# Patient Record
Sex: Female | Born: 1965
Health system: Southern US, Community
[De-identification: ages and names within clinical notes are randomized; demographics above are authoritative.]

## PROBLEM LIST (undated history)

## (undated) DIAGNOSIS — G8929 Other chronic pain: Secondary | ICD-10-CM

## (undated) DIAGNOSIS — H269 Unspecified cataract: Secondary | ICD-10-CM

## (undated) DIAGNOSIS — R51 Headache: Secondary | ICD-10-CM

## (undated) DIAGNOSIS — T7840XA Allergy, unspecified, initial encounter: Secondary | ICD-10-CM

## (undated) DIAGNOSIS — F909 Attention-deficit hyperactivity disorder, unspecified type: Secondary | ICD-10-CM

## (undated) DIAGNOSIS — R112 Nausea with vomiting, unspecified: Secondary | ICD-10-CM

## (undated) DIAGNOSIS — H2010 Chronic iridocyclitis, unspecified eye: Secondary | ICD-10-CM

## (undated) DIAGNOSIS — Z9889 Other specified postprocedural states: Secondary | ICD-10-CM

## (undated) DIAGNOSIS — K589 Irritable bowel syndrome without diarrhea: Secondary | ICD-10-CM

## (undated) DIAGNOSIS — M797 Fibromyalgia: Secondary | ICD-10-CM

## (undated) DIAGNOSIS — K760 Fatty (change of) liver, not elsewhere classified: Secondary | ICD-10-CM

## (undated) DIAGNOSIS — M1990 Unspecified osteoarthritis, unspecified site: Secondary | ICD-10-CM

## (undated) DIAGNOSIS — IMO0002 Reserved for concepts with insufficient information to code with codable children: Secondary | ICD-10-CM

## (undated) DIAGNOSIS — H35341 Macular cyst, hole, or pseudohole, right eye: Secondary | ICD-10-CM

## (undated) DIAGNOSIS — G4730 Sleep apnea, unspecified: Secondary | ICD-10-CM

## (undated) DIAGNOSIS — F32A Depression, unspecified: Secondary | ICD-10-CM

## (undated) DIAGNOSIS — E119 Type 2 diabetes mellitus without complications: Secondary | ICD-10-CM

## (undated) DIAGNOSIS — I10 Essential (primary) hypertension: Secondary | ICD-10-CM

## (undated) DIAGNOSIS — I82401 Acute embolism and thrombosis of unspecified deep veins of right lower extremity: Secondary | ICD-10-CM

## (undated) DIAGNOSIS — R519 Headache, unspecified: Secondary | ICD-10-CM

## (undated) DIAGNOSIS — S82409A Unspecified fracture of shaft of unspecified fibula, initial encounter for closed fracture: Secondary | ICD-10-CM

## (undated) DIAGNOSIS — E0590 Thyrotoxicosis, unspecified without thyrotoxic crisis or storm: Secondary | ICD-10-CM

## (undated) DIAGNOSIS — F419 Anxiety disorder, unspecified: Secondary | ICD-10-CM

## (undated) DIAGNOSIS — L309 Dermatitis, unspecified: Secondary | ICD-10-CM

## (undated) DIAGNOSIS — K219 Gastro-esophageal reflux disease without esophagitis: Secondary | ICD-10-CM

## (undated) DIAGNOSIS — E039 Hypothyroidism, unspecified: Secondary | ICD-10-CM

## (undated) DIAGNOSIS — F329 Major depressive disorder, single episode, unspecified: Secondary | ICD-10-CM

## (undated) HISTORY — DX: Depression, unspecified: F32.A

## (undated) HISTORY — PX: TONSILLECTOMY: SUR1361

## (undated) HISTORY — DX: Irritable bowel syndrome, unspecified: K58.9

## (undated) HISTORY — DX: Sleep apnea, unspecified: G47.30

## (undated) HISTORY — DX: Headache: R51

## (undated) HISTORY — PX: COLONOSCOPY: SHX174

## (undated) HISTORY — PX: WISDOM TOOTH EXTRACTION: SHX21

## (undated) HISTORY — DX: Dermatitis, unspecified: L30.9

## (undated) HISTORY — DX: Gastro-esophageal reflux disease without esophagitis: K21.9

## (undated) HISTORY — PX: FRACTURE SURGERY: SHX138

## (undated) HISTORY — PX: ABDOMINAL HYSTERECTOMY: SHX81

## (undated) HISTORY — DX: Allergy, unspecified, initial encounter: T78.40XA

## (undated) HISTORY — PX: BUNIONECTOMY: SHX129

## (undated) HISTORY — DX: Chronic iridocyclitis, unspecified eye: H20.10

## (undated) HISTORY — DX: Macular cyst, hole, or pseudohole, right eye: H35.341

## (undated) HISTORY — DX: Unspecified osteoarthritis, unspecified site: M19.90

## (undated) HISTORY — DX: Fibromyalgia: M79.7

## (undated) HISTORY — PX: BREAST SURGERY: SHX581

## (undated) HISTORY — DX: Reserved for concepts with insufficient information to code with codable children: IMO0002

## (undated) HISTORY — DX: Unspecified cataract: H26.9

## (undated) HISTORY — DX: Headache, unspecified: R51.9

## (undated) HISTORY — DX: Unspecified fracture of shaft of unspecified fibula, initial encounter for closed fracture: S82.409A

## (undated) HISTORY — DX: Essential (primary) hypertension: I10

## (undated) HISTORY — PX: EYE SURGERY: SHX253

## (undated) HISTORY — DX: Other chronic pain: G89.29

## (undated) HISTORY — DX: Major depressive disorder, single episode, unspecified: F32.9

---

## 1993-01-05 HISTORY — PX: BREAST EXCISIONAL BIOPSY: SUR124

## 1994-01-05 HISTORY — PX: ANTERIOR CRUCIATE LIGAMENT REPAIR: SHX115

## 1998-03-19 ENCOUNTER — Encounter: Payer: Self-pay | Admitting: Obstetrics and Gynecology

## 1998-03-19 ENCOUNTER — Ambulatory Visit (HOSPITAL_COMMUNITY): Admission: RE | Admit: 1998-03-19 | Discharge: 1998-03-19 | Payer: Self-pay | Admitting: Obstetrics and Gynecology

## 1998-04-16 ENCOUNTER — Other Ambulatory Visit: Admission: RE | Admit: 1998-04-16 | Discharge: 1998-04-16 | Payer: Self-pay | Admitting: Obstetrics and Gynecology

## 1998-12-06 ENCOUNTER — Ambulatory Visit (HOSPITAL_COMMUNITY): Admission: RE | Admit: 1998-12-06 | Discharge: 1998-12-06 | Payer: Self-pay | Admitting: Obstetrics and Gynecology

## 1999-04-06 ENCOUNTER — Encounter: Payer: Self-pay | Admitting: Emergency Medicine

## 1999-04-06 ENCOUNTER — Emergency Department (HOSPITAL_COMMUNITY): Admission: EM | Admit: 1999-04-06 | Discharge: 1999-04-06 | Payer: Self-pay | Admitting: Emergency Medicine

## 1999-05-29 ENCOUNTER — Other Ambulatory Visit: Admission: RE | Admit: 1999-05-29 | Discharge: 1999-05-29 | Payer: Self-pay | Admitting: Obstetrics and Gynecology

## 1999-10-20 ENCOUNTER — Encounter: Admission: RE | Admit: 1999-10-20 | Discharge: 1999-10-20 | Payer: Self-pay | Admitting: Emergency Medicine

## 1999-10-20 ENCOUNTER — Encounter: Payer: Self-pay | Admitting: Emergency Medicine

## 2000-04-04 ENCOUNTER — Emergency Department (HOSPITAL_COMMUNITY): Admission: EM | Admit: 2000-04-04 | Discharge: 2000-04-04 | Payer: Self-pay | Admitting: Emergency Medicine

## 2000-05-27 ENCOUNTER — Other Ambulatory Visit: Admission: RE | Admit: 2000-05-27 | Discharge: 2000-05-27 | Payer: Self-pay | Admitting: Obstetrics and Gynecology

## 2000-07-22 ENCOUNTER — Ambulatory Visit (HOSPITAL_COMMUNITY): Admission: RE | Admit: 2000-07-22 | Discharge: 2000-07-22 | Payer: Self-pay | Admitting: Obstetrics and Gynecology

## 2000-07-22 ENCOUNTER — Encounter (INDEPENDENT_AMBULATORY_CARE_PROVIDER_SITE_OTHER): Payer: Self-pay

## 2001-02-22 ENCOUNTER — Inpatient Hospital Stay (HOSPITAL_COMMUNITY): Admission: AD | Admit: 2001-02-22 | Discharge: 2001-02-22 | Payer: Self-pay | Admitting: Obstetrics and Gynecology

## 2001-02-22 ENCOUNTER — Encounter: Payer: Self-pay | Admitting: Obstetrics and Gynecology

## 2001-03-01 ENCOUNTER — Encounter: Payer: Self-pay | Admitting: Obstetrics and Gynecology

## 2001-03-01 ENCOUNTER — Inpatient Hospital Stay (HOSPITAL_COMMUNITY): Admission: AD | Admit: 2001-03-01 | Discharge: 2001-03-01 | Payer: Self-pay | Admitting: Obstetrics and Gynecology

## 2001-04-11 ENCOUNTER — Inpatient Hospital Stay (HOSPITAL_COMMUNITY): Admission: AD | Admit: 2001-04-11 | Discharge: 2001-04-11 | Payer: Self-pay | Admitting: Obstetrics and Gynecology

## 2001-04-20 ENCOUNTER — Inpatient Hospital Stay (HOSPITAL_COMMUNITY): Admission: AD | Admit: 2001-04-20 | Discharge: 2001-04-20 | Payer: Self-pay | Admitting: Obstetrics and Gynecology

## 2001-04-21 ENCOUNTER — Encounter: Payer: Self-pay | Admitting: Obstetrics and Gynecology

## 2001-04-22 ENCOUNTER — Inpatient Hospital Stay (HOSPITAL_COMMUNITY): Admission: AD | Admit: 2001-04-22 | Discharge: 2001-04-24 | Payer: Self-pay | Admitting: Obstetrics and Gynecology

## 2001-05-06 ENCOUNTER — Inpatient Hospital Stay (HOSPITAL_COMMUNITY): Admission: AD | Admit: 2001-05-06 | Discharge: 2001-05-06 | Payer: Self-pay | Admitting: Obstetrics & Gynecology

## 2001-05-07 ENCOUNTER — Inpatient Hospital Stay (HOSPITAL_COMMUNITY): Admission: AD | Admit: 2001-05-07 | Discharge: 2001-05-07 | Payer: Self-pay | Admitting: Obstetrics & Gynecology

## 2001-05-09 ENCOUNTER — Encounter (INDEPENDENT_AMBULATORY_CARE_PROVIDER_SITE_OTHER): Payer: Self-pay | Admitting: Specialist

## 2001-05-09 ENCOUNTER — Inpatient Hospital Stay (HOSPITAL_COMMUNITY): Admission: AD | Admit: 2001-05-09 | Discharge: 2001-05-13 | Payer: Self-pay | Admitting: *Deleted

## 2001-05-14 ENCOUNTER — Encounter: Admission: RE | Admit: 2001-05-14 | Discharge: 2001-06-13 | Payer: Self-pay | Admitting: Obstetrics and Gynecology

## 2001-06-07 ENCOUNTER — Encounter: Payer: Self-pay | Admitting: Obstetrics and Gynecology

## 2001-06-07 ENCOUNTER — Ambulatory Visit (HOSPITAL_COMMUNITY): Admission: RE | Admit: 2001-06-07 | Discharge: 2001-06-07 | Payer: Self-pay | Admitting: Obstetrics and Gynecology

## 2001-06-15 ENCOUNTER — Other Ambulatory Visit: Admission: RE | Admit: 2001-06-15 | Discharge: 2001-06-15 | Payer: Self-pay | Admitting: Obstetrics and Gynecology

## 2001-10-18 ENCOUNTER — Encounter: Admission: RE | Admit: 2001-10-18 | Discharge: 2001-10-18 | Payer: Self-pay | Admitting: Emergency Medicine

## 2001-10-18 ENCOUNTER — Encounter: Payer: Self-pay | Admitting: Emergency Medicine

## 2001-10-26 ENCOUNTER — Emergency Department (HOSPITAL_COMMUNITY): Admission: EM | Admit: 2001-10-26 | Discharge: 2001-10-26 | Payer: Self-pay | Admitting: Emergency Medicine

## 2002-06-28 ENCOUNTER — Other Ambulatory Visit: Admission: RE | Admit: 2002-06-28 | Discharge: 2002-06-28 | Payer: Self-pay | Admitting: Obstetrics and Gynecology

## 2002-09-13 ENCOUNTER — Encounter: Payer: Self-pay | Admitting: Emergency Medicine

## 2002-09-13 ENCOUNTER — Encounter: Admission: RE | Admit: 2002-09-13 | Discharge: 2002-09-13 | Payer: Self-pay | Admitting: Emergency Medicine

## 2002-10-08 ENCOUNTER — Emergency Department (HOSPITAL_COMMUNITY): Admission: EM | Admit: 2002-10-08 | Discharge: 2002-10-08 | Payer: Self-pay | Admitting: Emergency Medicine

## 2002-10-22 ENCOUNTER — Encounter: Payer: Self-pay | Admitting: Emergency Medicine

## 2002-10-22 ENCOUNTER — Encounter: Admission: RE | Admit: 2002-10-22 | Discharge: 2002-10-22 | Payer: Self-pay | Admitting: Emergency Medicine

## 2002-11-24 ENCOUNTER — Encounter: Admission: RE | Admit: 2002-11-24 | Discharge: 2002-11-24 | Payer: Self-pay | Admitting: Emergency Medicine

## 2002-12-12 ENCOUNTER — Encounter: Admission: RE | Admit: 2002-12-12 | Discharge: 2002-12-12 | Payer: Self-pay | Admitting: Emergency Medicine

## 2002-12-15 ENCOUNTER — Encounter: Admission: RE | Admit: 2002-12-15 | Discharge: 2002-12-15 | Payer: Self-pay | Admitting: Emergency Medicine

## 2002-12-28 ENCOUNTER — Encounter: Admission: RE | Admit: 2002-12-28 | Discharge: 2002-12-28 | Payer: Self-pay | Admitting: Emergency Medicine

## 2003-01-17 ENCOUNTER — Emergency Department (HOSPITAL_COMMUNITY): Admission: EM | Admit: 2003-01-17 | Discharge: 2003-01-17 | Payer: Self-pay | Admitting: Emergency Medicine

## 2003-04-03 ENCOUNTER — Emergency Department (HOSPITAL_COMMUNITY): Admission: EM | Admit: 2003-04-03 | Discharge: 2003-04-03 | Payer: Self-pay

## 2003-05-26 ENCOUNTER — Emergency Department (HOSPITAL_COMMUNITY): Admission: EM | Admit: 2003-05-26 | Discharge: 2003-05-26 | Payer: Self-pay | Admitting: Emergency Medicine

## 2003-07-27 ENCOUNTER — Encounter: Admission: RE | Admit: 2003-07-27 | Discharge: 2003-07-27 | Payer: Self-pay | Admitting: Emergency Medicine

## 2003-07-30 ENCOUNTER — Other Ambulatory Visit: Admission: RE | Admit: 2003-07-30 | Discharge: 2003-07-30 | Payer: Self-pay | Admitting: Obstetrics and Gynecology

## 2004-03-11 ENCOUNTER — Encounter: Admission: RE | Admit: 2004-03-11 | Discharge: 2004-03-11 | Payer: Self-pay | Admitting: Rheumatology

## 2004-05-05 ENCOUNTER — Encounter: Payer: Self-pay | Admitting: Family Medicine

## 2004-05-05 LAB — CONVERTED CEMR LAB

## 2004-06-04 ENCOUNTER — Other Ambulatory Visit: Admission: RE | Admit: 2004-06-04 | Discharge: 2004-06-04 | Payer: Self-pay | Admitting: Obstetrics and Gynecology

## 2004-06-10 ENCOUNTER — Ambulatory Visit: Payer: Self-pay | Admitting: Family Medicine

## 2004-06-30 ENCOUNTER — Ambulatory Visit: Payer: Self-pay | Admitting: Family Medicine

## 2004-07-24 ENCOUNTER — Ambulatory Visit (HOSPITAL_COMMUNITY): Admission: RE | Admit: 2004-07-24 | Discharge: 2004-07-24 | Payer: Self-pay | Admitting: Obstetrics and Gynecology

## 2004-07-24 ENCOUNTER — Encounter (INDEPENDENT_AMBULATORY_CARE_PROVIDER_SITE_OTHER): Payer: Self-pay | Admitting: *Deleted

## 2004-08-04 ENCOUNTER — Ambulatory Visit: Payer: Self-pay | Admitting: Family Medicine

## 2004-08-05 HISTORY — PX: LAPAROSCOPY: SHX197

## 2004-08-25 ENCOUNTER — Ambulatory Visit: Payer: Self-pay | Admitting: Family Medicine

## 2004-10-21 ENCOUNTER — Ambulatory Visit: Payer: Self-pay | Admitting: Family Medicine

## 2004-10-29 ENCOUNTER — Ambulatory Visit: Payer: Self-pay | Admitting: Family Medicine

## 2004-12-03 ENCOUNTER — Ambulatory Visit: Payer: Self-pay | Admitting: Family Medicine

## 2005-01-01 ENCOUNTER — Ambulatory Visit: Payer: Self-pay | Admitting: Family Medicine

## 2005-01-28 ENCOUNTER — Ambulatory Visit: Payer: Self-pay | Admitting: Family Medicine

## 2005-06-08 ENCOUNTER — Ambulatory Visit: Payer: Self-pay | Admitting: Gastroenterology

## 2005-06-19 ENCOUNTER — Ambulatory Visit: Payer: Self-pay | Admitting: Family Medicine

## 2005-06-25 ENCOUNTER — Ambulatory Visit: Payer: Self-pay | Admitting: Gastroenterology

## 2005-06-25 ENCOUNTER — Ambulatory Visit: Payer: Self-pay | Admitting: Family Medicine

## 2005-07-11 ENCOUNTER — Ambulatory Visit (HOSPITAL_COMMUNITY): Admission: RE | Admit: 2005-07-11 | Discharge: 2005-07-11 | Payer: Self-pay | Admitting: Family Medicine

## 2005-07-11 ENCOUNTER — Ambulatory Visit: Payer: Self-pay | Admitting: Family Medicine

## 2005-07-13 ENCOUNTER — Encounter: Admission: RE | Admit: 2005-07-13 | Discharge: 2005-07-13 | Payer: Self-pay | Admitting: Orthopedic Surgery

## 2005-07-21 ENCOUNTER — Ambulatory Visit: Payer: Self-pay | Admitting: Family Medicine

## 2005-08-17 ENCOUNTER — Ambulatory Visit: Payer: Self-pay | Admitting: Family Medicine

## 2005-09-19 ENCOUNTER — Ambulatory Visit: Payer: Self-pay | Admitting: Family Medicine

## 2005-10-30 ENCOUNTER — Ambulatory Visit: Payer: Self-pay | Admitting: Family Medicine

## 2006-01-14 ENCOUNTER — Ambulatory Visit: Payer: Self-pay | Admitting: Family Medicine

## 2006-02-02 ENCOUNTER — Ambulatory Visit (HOSPITAL_COMMUNITY): Admission: RE | Admit: 2006-02-02 | Discharge: 2006-02-02 | Payer: Self-pay | Admitting: Family Medicine

## 2006-02-02 ENCOUNTER — Ambulatory Visit: Payer: Self-pay | Admitting: Family Medicine

## 2006-02-03 ENCOUNTER — Ambulatory Visit (HOSPITAL_COMMUNITY): Admission: RE | Admit: 2006-02-03 | Discharge: 2006-02-03 | Payer: Self-pay | Admitting: Family Medicine

## 2006-02-25 ENCOUNTER — Ambulatory Visit: Payer: Self-pay | Admitting: Family Medicine

## 2006-04-27 ENCOUNTER — Ambulatory Visit: Payer: Self-pay | Admitting: Family Medicine

## 2006-05-21 ENCOUNTER — Encounter: Payer: Self-pay | Admitting: Family Medicine

## 2006-08-05 ENCOUNTER — Encounter (INDEPENDENT_AMBULATORY_CARE_PROVIDER_SITE_OTHER): Payer: Self-pay | Admitting: Obstetrics and Gynecology

## 2006-08-05 ENCOUNTER — Ambulatory Visit (HOSPITAL_COMMUNITY): Admission: RE | Admit: 2006-08-05 | Discharge: 2006-08-06 | Payer: Self-pay | Admitting: Obstetrics and Gynecology

## 2006-09-09 ENCOUNTER — Telehealth (INDEPENDENT_AMBULATORY_CARE_PROVIDER_SITE_OTHER): Payer: Self-pay | Admitting: *Deleted

## 2006-09-29 ENCOUNTER — Encounter: Payer: Self-pay | Admitting: Family Medicine

## 2006-09-29 DIAGNOSIS — F418 Other specified anxiety disorders: Secondary | ICD-10-CM | POA: Insufficient documentation

## 2006-09-29 DIAGNOSIS — N979 Female infertility, unspecified: Secondary | ICD-10-CM | POA: Insufficient documentation

## 2006-09-29 DIAGNOSIS — K589 Irritable bowel syndrome without diarrhea: Secondary | ICD-10-CM | POA: Insufficient documentation

## 2006-09-29 DIAGNOSIS — J302 Other seasonal allergic rhinitis: Secondary | ICD-10-CM | POA: Insufficient documentation

## 2006-09-29 DIAGNOSIS — G4700 Insomnia, unspecified: Secondary | ICD-10-CM | POA: Insufficient documentation

## 2006-09-29 DIAGNOSIS — M797 Fibromyalgia: Secondary | ICD-10-CM | POA: Insufficient documentation

## 2006-09-29 DIAGNOSIS — J3089 Other allergic rhinitis: Secondary | ICD-10-CM

## 2006-09-29 DIAGNOSIS — I10 Essential (primary) hypertension: Secondary | ICD-10-CM | POA: Insufficient documentation

## 2006-09-29 DIAGNOSIS — M549 Dorsalgia, unspecified: Secondary | ICD-10-CM | POA: Insufficient documentation

## 2006-09-29 DIAGNOSIS — K219 Gastro-esophageal reflux disease without esophagitis: Secondary | ICD-10-CM | POA: Insufficient documentation

## 2006-09-29 DIAGNOSIS — J4520 Mild intermittent asthma, uncomplicated: Secondary | ICD-10-CM | POA: Insufficient documentation

## 2006-09-29 DIAGNOSIS — F329 Major depressive disorder, single episode, unspecified: Secondary | ICD-10-CM | POA: Insufficient documentation

## 2006-09-29 DIAGNOSIS — N809 Endometriosis, unspecified: Secondary | ICD-10-CM | POA: Insufficient documentation

## 2006-10-01 ENCOUNTER — Ambulatory Visit: Payer: Self-pay | Admitting: Internal Medicine

## 2006-10-08 ENCOUNTER — Encounter: Payer: Self-pay | Admitting: Internal Medicine

## 2006-10-08 ENCOUNTER — Ambulatory Visit (HOSPITAL_BASED_OUTPATIENT_CLINIC_OR_DEPARTMENT_OTHER): Admission: RE | Admit: 2006-10-08 | Discharge: 2006-10-08 | Payer: Self-pay | Admitting: Internal Medicine

## 2006-10-13 ENCOUNTER — Ambulatory Visit: Payer: Self-pay | Admitting: Family Medicine

## 2006-10-15 ENCOUNTER — Ambulatory Visit: Payer: Self-pay | Admitting: Internal Medicine

## 2006-10-18 ENCOUNTER — Telehealth (INDEPENDENT_AMBULATORY_CARE_PROVIDER_SITE_OTHER): Payer: Self-pay | Admitting: *Deleted

## 2006-10-20 ENCOUNTER — Encounter: Payer: Self-pay | Admitting: Family Medicine

## 2006-10-28 ENCOUNTER — Ambulatory Visit: Payer: Self-pay | Admitting: Family Medicine

## 2006-11-04 ENCOUNTER — Ambulatory Visit: Payer: Self-pay | Admitting: Family Medicine

## 2006-11-11 ENCOUNTER — Ambulatory Visit: Payer: Self-pay | Admitting: Internal Medicine

## 2006-11-19 DIAGNOSIS — G4733 Obstructive sleep apnea (adult) (pediatric): Secondary | ICD-10-CM | POA: Insufficient documentation

## 2006-12-01 ENCOUNTER — Telehealth: Payer: Self-pay | Admitting: Family Medicine

## 2006-12-17 ENCOUNTER — Ambulatory Visit: Payer: Self-pay | Admitting: Internal Medicine

## 2007-01-06 DIAGNOSIS — S82409A Unspecified fracture of shaft of unspecified fibula, initial encounter for closed fracture: Secondary | ICD-10-CM

## 2007-01-06 HISTORY — DX: Unspecified fracture of shaft of unspecified fibula, initial encounter for closed fracture: S82.409A

## 2007-01-12 ENCOUNTER — Ambulatory Visit: Payer: Self-pay | Admitting: Family Medicine

## 2007-02-09 ENCOUNTER — Ambulatory Visit: Payer: Self-pay | Admitting: Family Medicine

## 2007-02-10 ENCOUNTER — Encounter: Payer: Self-pay | Admitting: Family Medicine

## 2007-03-16 ENCOUNTER — Ambulatory Visit: Payer: Self-pay | Admitting: Family Medicine

## 2007-03-21 LAB — CONVERTED CEMR LAB
ALT: 10 units/L (ref 0–35)
AST: 21 units/L (ref 0–37)
Albumin: 3.6 g/dL (ref 3.5–5.2)
Alkaline Phosphatase: 85 units/L (ref 39–117)
BUN: 6 mg/dL (ref 6–23)
Basophils Absolute: 0.1 10*3/uL (ref 0.0–0.1)
Basophils Relative: 0.9 % (ref 0.0–1.0)
Bilirubin, Direct: 0.1 mg/dL (ref 0.0–0.3)
CO2: 30 meq/L (ref 19–32)
Calcium: 9.2 mg/dL (ref 8.4–10.5)
Chloride: 106 meq/L (ref 96–112)
Cholesterol: 193 mg/dL (ref 0–200)
Creatinine, Ser: 0.9 mg/dL (ref 0.4–1.2)
Eosinophils Absolute: 0.4 10*3/uL (ref 0.0–0.6)
Eosinophils Relative: 4.1 % (ref 0.0–5.0)
GFR calc Af Amer: 90 mL/min
GFR calc non Af Amer: 74 mL/min
Glucose, Bld: 86 mg/dL (ref 70–99)
HCT: 41.8 % (ref 36.0–46.0)
HDL: 58.2 mg/dL (ref >39.0)
Hemoglobin: 13.9 g/dL (ref 12.0–15.0)
LDL Cholesterol: 110 mg/dL — ABNORMAL HIGH (ref 0–99)
Lymphocytes Relative: 26.6 % (ref 12.0–46.0)
MCHC: 33.3 g/dL (ref 30.0–36.0)
MCV: 86.5 fL (ref 78.0–100.0)
Monocytes Absolute: 0.6 10*3/uL (ref 0.2–0.7)
Monocytes Relative: 7.2 % (ref 3.0–11.0)
Neutro Abs: 5.2 10*3/uL (ref 1.4–7.7)
Neutrophils Relative %: 61.2 % (ref 43.0–77.0)
Phosphorus: 2.6 mg/dL (ref 2.3–4.6)
Platelets: 262 10*3/uL (ref 150–400)
Potassium: 3.9 meq/L (ref 3.5–5.1)
RBC: 4.84 M/uL (ref 3.87–5.11)
RDW: 14.8 % — ABNORMAL HIGH (ref 11.5–14.6)
Sodium: 141 meq/L (ref 135–145)
TSH: 2.41 microintl units/mL (ref 0.35–5.50)
Total Bilirubin: 0.6 mg/dL (ref 0.3–1.2)
Total CHOL/HDL Ratio: 3.3
Total Protein: 7.1 g/dL (ref 6.0–8.3)
Triglycerides: 122 mg/dL (ref 0–149)
VLDL: 24 mg/dL (ref 0–40)
WBC: 8.6 10*3/uL (ref 4.5–10.5)

## 2007-03-25 ENCOUNTER — Telehealth: Payer: Self-pay | Admitting: Family Medicine

## 2007-04-04 ENCOUNTER — Encounter: Payer: Self-pay | Admitting: Family Medicine

## 2007-04-06 ENCOUNTER — Encounter: Payer: Self-pay | Admitting: Family Medicine

## 2007-04-20 ENCOUNTER — Ambulatory Visit: Payer: Self-pay | Admitting: Family Medicine

## 2007-04-28 ENCOUNTER — Ambulatory Visit: Payer: Self-pay | Admitting: Internal Medicine

## 2007-06-09 ENCOUNTER — Encounter: Admission: RE | Admit: 2007-06-09 | Discharge: 2007-06-09 | Payer: Self-pay | Admitting: Family Medicine

## 2007-06-09 ENCOUNTER — Ambulatory Visit: Payer: Self-pay | Admitting: Family Medicine

## 2007-07-07 ENCOUNTER — Ambulatory Visit: Payer: Self-pay | Admitting: Family Medicine

## 2007-07-12 ENCOUNTER — Telehealth (INDEPENDENT_AMBULATORY_CARE_PROVIDER_SITE_OTHER): Payer: Self-pay | Admitting: Internal Medicine

## 2007-09-01 ENCOUNTER — Ambulatory Visit: Payer: Self-pay | Admitting: Family Medicine

## 2007-09-02 ENCOUNTER — Telehealth (INDEPENDENT_AMBULATORY_CARE_PROVIDER_SITE_OTHER): Payer: Self-pay | Admitting: *Deleted

## 2007-10-03 ENCOUNTER — Ambulatory Visit: Payer: Self-pay | Admitting: Family Medicine

## 2007-10-19 ENCOUNTER — Ambulatory Visit: Payer: Self-pay | Admitting: Family Medicine

## 2007-10-27 ENCOUNTER — Ambulatory Visit: Payer: Self-pay | Admitting: Family Medicine

## 2007-10-27 LAB — CONVERTED CEMR LAB: Rapid Strep: NEGATIVE

## 2007-11-10 ENCOUNTER — Encounter: Payer: Self-pay | Admitting: Family Medicine

## 2007-11-10 ENCOUNTER — Ambulatory Visit: Payer: Self-pay | Admitting: Family Medicine

## 2007-12-06 ENCOUNTER — Telehealth: Payer: Self-pay | Admitting: Family Medicine

## 2008-01-02 ENCOUNTER — Telehealth (INDEPENDENT_AMBULATORY_CARE_PROVIDER_SITE_OTHER): Payer: Self-pay | Admitting: *Deleted

## 2008-01-04 ENCOUNTER — Ambulatory Visit: Payer: Self-pay | Admitting: Family Medicine

## 2008-01-04 LAB — CONVERTED CEMR LAB: Rapid Strep: NEGATIVE

## 2008-01-26 ENCOUNTER — Ambulatory Visit: Payer: Self-pay | Admitting: Family Medicine

## 2008-02-06 ENCOUNTER — Telehealth (INDEPENDENT_AMBULATORY_CARE_PROVIDER_SITE_OTHER): Payer: Self-pay | Admitting: *Deleted

## 2008-02-13 ENCOUNTER — Telehealth: Payer: Self-pay | Admitting: Family Medicine

## 2008-02-22 ENCOUNTER — Encounter: Payer: Self-pay | Admitting: Family Medicine

## 2008-03-10 ENCOUNTER — Telehealth: Payer: Self-pay | Admitting: Internal Medicine

## 2008-03-10 ENCOUNTER — Ambulatory Visit: Payer: Self-pay | Admitting: Internal Medicine

## 2008-03-23 ENCOUNTER — Telehealth: Payer: Self-pay | Admitting: Family Medicine

## 2008-03-27 ENCOUNTER — Ambulatory Visit: Payer: Self-pay | Admitting: Family Medicine

## 2008-04-23 ENCOUNTER — Ambulatory Visit: Payer: Self-pay | Admitting: Internal Medicine

## 2008-07-02 ENCOUNTER — Telehealth: Payer: Self-pay | Admitting: Family Medicine

## 2008-09-04 ENCOUNTER — Telehealth: Payer: Self-pay | Admitting: Family Medicine

## 2008-10-03 ENCOUNTER — Ambulatory Visit: Payer: Self-pay | Admitting: Family Medicine

## 2009-02-14 ENCOUNTER — Telehealth: Payer: Self-pay | Admitting: Family Medicine

## 2009-04-29 ENCOUNTER — Ambulatory Visit: Payer: Self-pay | Admitting: Family Medicine

## 2009-05-22 ENCOUNTER — Ambulatory Visit: Payer: Self-pay | Admitting: Internal Medicine

## 2009-06-04 ENCOUNTER — Telehealth: Payer: Self-pay | Admitting: Internal Medicine

## 2009-06-07 ENCOUNTER — Telehealth: Payer: Self-pay | Admitting: Internal Medicine

## 2009-09-06 ENCOUNTER — Telehealth: Payer: Self-pay | Admitting: Internal Medicine

## 2009-10-09 ENCOUNTER — Ambulatory Visit: Payer: Self-pay | Admitting: Family Medicine

## 2009-11-12 ENCOUNTER — Ambulatory Visit: Payer: Self-pay | Admitting: Family Medicine

## 2009-11-12 DIAGNOSIS — J069 Acute upper respiratory infection, unspecified: Secondary | ICD-10-CM | POA: Insufficient documentation

## 2009-11-12 DIAGNOSIS — L301 Dyshidrosis [pompholyx]: Secondary | ICD-10-CM | POA: Insufficient documentation

## 2009-11-12 LAB — CONVERTED CEMR LAB: Rapid Strep: NEGATIVE

## 2009-11-18 ENCOUNTER — Ambulatory Visit: Payer: Self-pay | Admitting: Internal Medicine

## 2010-02-06 NOTE — Assessment & Plan Note (Signed)
Summary: 11:30  BACK PAIN/CLE   Vital Signs:  Patient Profile:   45 Years Old Female Weight:      183 pounds Temp:     98.5 degrees F oral Pulse rate:   68 / minute Pulse rhythm:   regular BP sitting:   110 / 80  (left arm) Cuff size:   regular  Vitals Entered By: Lowella Petties (June 09, 2007 11:23 AM)                 PCP:  Roxy Manns  Chief Complaint:  Left mid back pain.  History of Present Illness: middle/low back hurts on L side when it flares- it is severe for several days muscle relaxers do not work  hydrocodone does not help, tylenol does not help  worse if she twists certain direction-- and worse as the day goes on if she is up and around  has hx of disc disease in past scoliosis in LS - from last mri  no numbness or weakness no rad to leg or buttock no particular alleviating movement or position    Current Allergies: ERYTHROMYCIN  Past Medical History:    Reviewed history from 04/28/2007 and no changes required:       Allergic rhinitis       Asthma       Depression       GERD       Hypertension       fibromyalgia       deg disc disease       Sleep Apnea  Past Surgical History:    Reviewed history from 02/09/2007 and no changes required:       Laparoscopy- endometriosis- several       Left nipple removed- benign tumor       Left knee surgery (1996)- small tear ACL       Bunionectomy       Tonsillectomy       Laparoscopy- endometriosis (08/2004)       MRI LS- mild scoliosis (09/2002)       C-S MRI - spur, central disk herniation C6-7, T 1-3 (10/2002)       Arm fracture, radial head (07/2005)       hyst 7/08 for endometriosis and L ovary remains   Family History:    Reviewed history from 09/29/2006 and no changes required:       Father: DM       Mother: HTN, fibromyalgia, thyroid problems       Siblings: 1  Social History:    Reviewed history from 09/29/2006 and no changes required:       Marital Status: Married       Children: 1  daughter       Occupation: disabled- fibromyalgia     Review of Systems  General      Denies chills and fever.  GU      Denies dysuria, hematuria, and urinary frequency.      no urgency  MS      Complains of low back pain.      Denies joint redness and joint swelling.  Derm      Denies itching, lesion(s), and rash.  Neuro      Denies numbness, tingling, and weakness.   Physical Exam  General:     normal appearance and healthy appearing.   Head:     normocephalic, atraumatic, and no abnormalities observed.   Neck:     full rom ,  without bony tenderness  Chest Wall:     No deformities, masses, or tenderness noted. Lungs:     Normal respiratory effort, chest expands symmetrically. Lungs are clear to auscultation, no crackles or wheezes. Heart:     Normal rate and regular rhythm. S1 and S2 normal without gallop, murmur, click, rub or other extra sounds. Abdomen:     no suprapubic tenderness or fullness felt  Msk:     no CVA tenderness no bony LS tenderness  slt L SI tenderness no palpable mass or spasm neg slr poor rom LS- flex 10 deg, ext 10 deg slt lat flex twist is ok  Pulses:     R and L carotid,radial,femoral,dorsalis pedis and posterior tibial pulses are full and equal bilaterally Extremities:     No clubbing, cyanosis, edema, or deformity noted with normal full range of motion of all joints.   Neurologic:     strength normal in all extremities, sensation intact to light touch, gait normal, and DTRs symmetrical and normal.   Skin:     Intact without suspicious lesions or rashes Cervical Nodes:     No lymphadenopathy noted Inguinal Nodes:     No significant adenopathy Psych:     normal affect, talkative and pleasant     Impression & Recommendations:  Problem # 1:  Hx of BACK PAIN (ICD-724.5) Assessment: Deteriorated recurrent low L back pain- with hx of deg disc and scoliosis and fibromyalgia- and no neurol s/s will try ultram to see if better  pain control than vicodin  heat, gentle stretches check LS x ray and update The following medications were removed from the medication list:    Flexeril 10 Mg Tabs (Cyclobenzaprine hcl) .Marland Kitchen... 1 by mouth three times a day prn  Her updated medication list for this problem includes:    Naproxen Sodium 500 Mg Tb24 (Naproxen sodium) .Marland Kitchen... 1 by mouth two times a day as needed with food    Vicodin 5-500 Mg Tabs (Hydrocodone-acetaminophen) .Marland Kitchen... 1 by mouth at bedtime as needed pain    Ultram 50 Mg Tabs (Tramadol hcl) .Marland Kitchen... 1 by mouth three times a day as needed pain  Orders: Radiology Referral (Radiology)   Complete Medication List: 1)  Lexapro 20 Mg Tabs (Escitalopram oxalate) .Marland Kitchen.. 1 by mouth qd 2)  Advair Diskus 250-50 Mcg/dose Misc (Fluticasone-salmeterol) .... Inhale 1 puff as directed twice a day 3)  Hydrochlorothiazide 25 Mg Tabs (Hydrochlorothiazide) .... One by mouth qd 4)  Nifedipine 90 Mg Tb24 (Nifedipine) .... One by mouth qd 5)  Nasacort Aq 55 Mcg/act Aers (Triamcinolone acetonide(nasal)) .... Use 2 sprays in each nostril once daily as needed 6)  Proair Hfa 108 (90 Base) Mcg/act Aers (Albuterol sulfate) .... 2 puffs q 4 hours as needed wheeze 7)  Wellbutrin Xl 300 Mg Tb24 (Bupropion hcl) .Marland Kitchen.. 1 by mouth qd 8)  Restoril 15 Mg Caps (Temazepam) .Marland Kitchen.. 1 by mouth at bedtime prn 9)  Naproxen Sodium 500 Mg Tb24 (Naproxen sodium) .Marland Kitchen.. 1 by mouth two times a day as needed with food 10)  Vicodin 5-500 Mg Tabs (Hydrocodone-acetaminophen) .Marland Kitchen.. 1 by mouth at bedtime as needed pain 11)  Cpap 13  12)  Ultram 50 Mg Tabs (Tramadol hcl) .Marland Kitchen.. 1 by mouth three times a day as needed pain   Patient Instructions: 1)  for back pain- try the ultram (caution of sedation) 2)  you can also try your naproxen 3)  use gentle heat and stretching  4)  we will schedule LS  x ray at check out 5)  if pain worsens or any numbness or weakness update me    Prescriptions: ULTRAM 50 MG  TABS (TRAMADOL HCL) 1 by  mouth three times a day as needed pain  #30 x 0   Entered and Authorized by:   Judith Part MD   Signed by:   Judith Part MD on 06/09/2007   Method used:   Print then Give to Patient   RxID:   [id number]  ]

## 2010-02-06 NOTE — Assessment & Plan Note (Signed)
Summary: rov 6 months///kp   Primary Provider/Referring Provider:  Roxy Manns  CC:  6 month follow up visit-OSA-no complaints; Recent bout of URI-abx from PCP-amoxicillin-getting better.Marland Kitchen  History of Present Illness: 04/23/08-  Insomnia with OSA, Allergic rhinitis, asthma Continues cpap at 13. For past 4-5 months she is waking with gas bloating and cramps. Can't sleep without machine. She doesn't remember any changes of weight or recent mask style change to explain why this has come up in the last 3-4 months. Pollen rhinitis and asthma bothered her quite a bit earlier this spring but settling now. We discussed her meds.  May 22, 2009- Insomnia with OSA, Allergic rhinitis, asthma Spring has been rough, with a bad cold 2 months ago followed by pollen allergy. She is using Nyquil for her nose so she can sleep and use her CPAP, still set on 11. This pressure is better than before on 13. She is not snoring through,, and is able to use it all night. Despite two times a day use of Advair 250, and Singulair, she is using her rescue inhaler at least twice daily. Wheezing wakes her once nightly- uses her inhaler. Heart burn bothers her, wakes her, treated only with Tums.  November 18, 2009-  Insomnia with OSA, Allergic rhinitis, asthma Nurse-CC: 6 month follow up visit-OSA-no complaints; Recent bout of URI-abx from PCP-amoxicillin-getting better. She had to stop CPAP for awhile last week when she had a sinus infection treated by Dr Milinda Antis with antibiotic and now much improved.  Otherwise she has remained comfortable and compliant at 10 cwp.  Continues Adair and Singulair. Before she got sick she was using Proair 1-2x/ week. had flu shot.  Nose always stuffy, not helped much by Allegra D, Nasonex. Tried nasal stips in past. Considered ENT evaluation for reduction of turbinates- she decidecd to wait.   Asthma History    Initial Asthma Severity Rating:    Age range: 12+ years    Symptoms: 0-2  days/week    Nighttime Awakenings: 0-2/month    Interferes w/ normal activity: no limitations    SABA use (not for EIB): 0-2 days/week    Asthma Severity Assessment: Intermittent   Preventive Screening-Counseling & Management  Alcohol-Tobacco     Smoking Status: never  Allergies: 1)  ! * Nifedipine 2)  ! Zoloft 3)  ! Lexapro 4)  ! Claritin 5)  ! Zyrtec Allergy 6)  ! * Lisinopril 7)  Erythromycin  Past History:  Past Medical History: Last updated: 09/01/2007 Allergic rhinitis Asthma Depression GERD Hypertension fibromyalgia deg disc disease Sleep Apnea fibula fx 2009  Past Surgical History: Last updated: 09/01/2007 Laparoscopy- endometriosis- several Left nipple removed- benign tumor Left knee surgery (1996)- small tear ACL Bunionectomy Tonsillectomy Laparoscopy- endometriosis (08/2004) MRI LS- mild scoliosis (09/2002) C-S MRI - spur, central disk herniation C6-7, T 1-3 (10/2002) Arm fracture, radial head (07/2005) hyst 7/08 for endometriosis and L ovary remains fx fibula  2009  Family History: Last updated: 09/29/2006 Father: DM Mother: HTN, fibromyalgia, thyroid problems Siblings: 1  Social History: Last updated: 11/18/2009 Marital Status: Married Children: 1 daughter Occupation: disabled- fibromyalgia  Patient never smoked.   Risk Factors: Smoking Status: never (11/18/2009)  Social History: Marital Status: Married Children: 1 daughter Occupation: disabled- fibromyalgia  Patient never smoked.   Review of Systems      See HPI       The patient complains of nasal congestion/difficulty breathing through nose and sneezing.  The patient denies shortness of breath with activity, shortness  of breath at rest, productive cough, non-productive cough, coughing up blood, chest pain, irregular heartbeats, acid heartburn, indigestion, loss of appetite, weight change, abdominal pain, difficulty swallowing, sore throat, tooth/dental problems, headaches,  itching, ear ache, rash, change in color of mucus, and fever.    Vital Signs:  Patient profile:   45 year old female Height:      66.5 inches Weight:      184.38 pounds BMI:     29.42 O2 Sat:      98 % on Room air Pulse rate:   98 / minute BP sitting:   144 / 86  (left arm) Cuff size:   regular  Vitals Entered By: Reynaldo Minium CMA (November 18, 2009 10:31 AM)  O2 Flow:  Room air  Physical Exam  Additional Exam:  General: A/Ox3; pleasant and cooperative, NAD, Medium build,  calm and appropriate SKIN: no rash, lesions NODES: no lymphadenopathy HEENT: [place]/AT, EOM- WNL, Conjuctivae- clear, PERRLA, TM-WNL, Nose- clear, turbinate prominence., Throat- clear and wnl, Mallampati III NECK: Supple w/ fair ROM, JVD- none, normal carotid impulses w/o bruits Thyroid- normal to palpation CHEST: Clear to P&A HEART: RRR, no m/g/r heard ABDOMEN: Soft and nl; nml bowel sounds; ZOX:WRUE, nl pulses, no edema  NEURO: Grossly intact to observation     Impression & Recommendations:  Problem # 1:  OBSTRUCTIVE SLEEP APNEA (ICD-327.23)  Good compliance and control w/ CPAP. No change needed for now.   Problem # 2:  ALLERGIC RHINITIS (ICD-477.9)  Perennial rhinitis.  We had skin tested years ago- paper chart. I will have that pulled for review.  Her updated medication list for this problem includes:    Nasonex 50 Mcg/act Susp (Mometasone furoate) .Marland Kitchen... 2 puffs each nostril once daily  Problem # 3:  ASTHMA (ICD-493.90) Current tools are sufficient. Discussed and compared maintenance to rescue therapy.  Other Orders: Est. Patient Level III (45409)  Patient Instructions: 1)  Please schedule a follow-up appointment in 6 months. 2)  We will look for old allergy skin test in paper  chart.  3)  Sample Astepro nasal antihistamine spray 4)     1-2 puffs each nostril up to twice daily if needed.Marland Kitchen

## 2010-02-06 NOTE — Assessment & Plan Note (Signed)
Summary: FLU SHOT/TOWER/DLO   Nurse Visit   Allergies: 1)  ! * Nifedipine 2)  ! Zoloft 3)  ! Lexapro 4)  ! Claritin 5)  ! Zyrtec Allergy 6)  ! * Lisinopril 7)  Erythromycin  Orders Added: 1)  Admin 1st Vaccine [90471] 2)  Flu Vaccine 13yrs + [16109]  Flu Vaccine Consent Questions     Do you have a history of severe allergic reactions to this vaccine? no    Any prior history of allergic reactions to egg and/or gelatin? no    Do you have a sensitivity to the preservative Thimersol? no    Do you have a past history of Guillan-Barre Syndrome? no    Do you currently have an acute febrile illness? no    Have you ever had a severe reaction to latex? no    Vaccine information given and explained to patient? yes    Are you currently pregnant? no    Lot Number:[id number]   Exp Date:07/05/2010   Site Given  Left Deltoid IMlu

## 2010-02-06 NOTE — Progress Notes (Signed)
Summary: prescript  Phone Note Call from Patient   Caller: Patient Call For: Micheal Murad Summary of Call: need refill for advair 250/50 [facility] Initial call taken by: Rickard Patience,  Jun 04, 2009 8:34 AM  Follow-up for Phone Call        rx sent. pt aware.Carron Curie CMA  Jun 04, 2009 9:37 AM     Prescriptions: ADVAIR DISKUS 250-50 MCG/DOSE MISC (FLUTICASONE-SALMETEROL) Inhale 1 puff as directed twice a day  #3 month x 3   Entered by:   Carron Curie CMA   Authorized by:   Waymon Budge MD   Signed by:   Carron Curie CMA on 06/04/2009   Method used:   Electronically to        [facility]  [address] [phone]* (retail)       [address]       [place], [place]  [address]       Ph: [phone]       Fax: [phone]   RxID:   [id number]

## 2010-02-06 NOTE — Assessment & Plan Note (Signed)
Summary: 12 months/apc   Primary Provider/Referring Provider:  Roxy Manns  CC:  Yearly follow up visit-sleep; doing good with CPAP machine; also having increased allergies .  History of Present Illness: 04/28/07- She returns for follow-up of her insomnia with sleep apnea, asthma, and allergic rhinitis.  She is concerned she might have a sinus infection.  She has had increased sinus pressure, and some yellowish nasal discharge.  Her upper teeth are hurting, and ears are popping.  This just started this morning.  She took a Zyrtec antihistamine.  She has continued very compliant with CPAP at 13CWP.  She has slept well with this and reported no particular problems.  04/23/08-  Insomnia with OSA, Allergic rhinitis, asthma Continues cpap at 13. For past 4-5 months she is waking with gas bloating and cramps. Can't sleep without machine. She doesn't remember any changes of weight or recent mask style change to explain why this has come up in the last 3-4 months. Pollen rhinitis and asthma bothered her quite a bit earlier this spring but settling now. We discussed her meds.  May 22, 2009- Insomnia with OSA, Allergic rhinitis, asthma Spring has been rough, with a bad cold 2 months ago followed by pollen allergy. She is using Nyquil for her nose so she can sleep and use her CPAP, still set on 11. This pressure is better than before on 13. She is not snoring through,, and is able to use it all night. Despite two times a day use of Advair 250, and Singulair, she is using her rescue inhaler at least twice daily. Wheezing wakes her once nightly- uses her inhaler. Heart burn bothers her, wakes her, treated only with Tums.    Current Medications (verified): 1)  Advair Diskus 250-50 Mcg/dose Misc (Fluticasone-Salmeterol) .... Inhale 1 Puff As Directed Twice A Day 2)  Proair Hfa 108 (90 Base) Mcg/act  Aers (Albuterol Sulfate) .... 2 Puffs Q 4 Hours As Needed Wheeze 3)  Cpap 13 4)  Singulair 10 Mg Tabs  (Montelukast Sodium) .Marland Kitchen.. 1 By Mouth Once Daily For Asthma and Allergy Symptoms 5)  Benicar 40 Mg Tabs (Olmesartan Medoxomil) .Marland Kitchen.. 1 By Mouth Once Daily  Allergies (verified): 1)  ! * Nifedipine 2)  ! Zoloft 3)  ! Lexapro 4)  ! Claritin 5)  ! Zyrtec Allergy 6)  ! * Lisinopril 7)  Erythromycin  Past History:  Past Medical History: Last updated: 09/01/2007 Allergic rhinitis Asthma Depression GERD Hypertension fibromyalgia deg disc disease Sleep Apnea fibula fx 2009  Past Surgical History: Last updated: 09/01/2007 Laparoscopy- endometriosis- several Left nipple removed- benign tumor Left knee surgery (1996)- small tear ACL Bunionectomy Tonsillectomy Laparoscopy- endometriosis (08/2004) MRI LS- mild scoliosis (09/2002) C-S MRI - spur, central disk herniation C6-7, T 1-3 (10/2002) Arm fracture, radial head (07/2005) hyst 7/08 for endometriosis and L ovary remains fx fibula  2009  Family History: Last updated: 09/29/2006 Father: DM Mother: HTN, fibromyalgia, thyroid problems Siblings: 1  Social History: Last updated: 10/03/2007 Marital Status: Married Children: 1 daughter Occupation: disabled- fibromyalgia  non smoker  Risk Factors: Smoking Status: never (09/29/2006)  Review of Systems      See HPI       The patient complains of shortness of breath with activity, non-productive cough, acid heartburn, and nasal congestion/difficulty breathing through nose.  The patient denies shortness of breath at rest, productive cough, coughing up blood, chest pain, irregular heartbeats, indigestion, loss of appetite, weight change, abdominal pain, difficulty swallowing, sore throat, tooth/dental problems, headaches, sneezing,  itching, ear ache, anxiety, depression, hand/feet swelling, joint stiffness or pain, rash, change in color of mucus, and fever.    Vital Signs:  Patient profile:   45 year old female Height:      66.5 inches Weight:      179 pounds BMI:      28.56 O2 Sat:      96 % on Room air Pulse rate:   102 / minute BP sitting:   130 / 92  (left arm) Cuff size:   regular  Vitals Entered By: Reynaldo Minium CMA (May 22, 2009 9:26 AM)  O2 Flow:  Room air  Physical Exam  Additional Exam:  General: A/Ox3; pleasant and cooperative, NAD, Medium build,  calm and appropriate SKIN: no rash, lesions NODES: no lymphadenopathy HEENT: [place]/AT, EOM- WNL, Conjuctivae- clear, PERRLA, TM-WNL, Nose- clear, Throat- clear and wnl, Mallampati III NECK: Supple w/ fair ROM, JVD- none, normal carotid impulses w/o bruits Thyroid- normal to palpation CHEST: Clear to P&A HEART: RRR, no m/g/r heard ABDOMEN: Soft and nl; nml bowel sounds; EAV:WUJW, nl pulses, no edema  NEURO: Grossly intact to observation     Impression & Recommendations:  Problem # 1:  OBSTRUCTIVE SLEEP APNEA (ICD-327.23) Good compliance and control at 11 cwp. Her nasal congestion is interfering.  Problem # 2:  ASTHMA (ICD-493.90) Control is not optimal. This may be a short term problem as allergy season ends in a few weeks. We discussed use of antihistamines, nasal steroids and option to further evaluate if discomfort persists.  Problem # 3:  ALLERGIC RHINITIS (ICD-477.9)  She is off of flonase and not taking anything for this, except for Nyquil at night. The following medications were removed from the medication list:    Flonase 50 Mcg/act Susp (Fluticasone propionate) .Marland Kitchen... 2 sprays in each nostril once daily  Problem # 4:  GERD (ICD-530.81)  This is bad enough to wake her. We discussed reflux control and suggested an otc acid blocker.  Medications Added to Medication List This Visit: 1)  Cpap 11 [facility]   Other Orders: Est. Patient Level III (11914)  Patient Instructions: 1)  Please schedule a follow-up appointment in 6 months. 2)  if asthma doesn't settle down in next couple of weeks as the pollen levels fall, let me know. 3)  Suggest an otc acid blocker like  pepcid, omeprazole, nexium- generics are fine. Take it before a meal so it acitvates properly. 4)  continue CPAP at 11.

## 2010-02-06 NOTE — Progress Notes (Signed)
Summary: regarding bariatric clinic  Phone Note Call from Patient Call back at Home Phone [phone]   Caller: Patient Call For: [name] Part MD Summary of Call: Pt is interested in going to a bariatric clinic for weight loss.  She wants your opinion on whether this is safe.  She is not interested in the surgery, just the life style changes that they promote. Initial call taken by: Lowella Petties CMA,  February 14, 2009 11:15 AM  Follow-up for Phone Call        I am a fan of the lifestyle change - just not the weight loss drugs  I have no problem with it otherwise  I also recommend weight watchers program - either meetings or on line   Follow-up by: [name] Part MD,  February 14, 2009 11:50 AM  Additional Follow-up for Phone Call Additional follow up Details #1::        Left message for patient to call back. Lewanda Rife LPN  February 14, 2009 12:01 PM   Patient notified as instructed by telephone. Pt said that was all she needed. Lewanda Rife LPN  February 14, 2009 3:53 PM

## 2010-02-06 NOTE — Assessment & Plan Note (Signed)
Summary: SORE THROAT, CONGESTION   Vital Signs:  Patient profile:   45 year old female Height:      66.5 inches Weight:      182.50 pounds BMI:     29.12 Temp:     98.4 degrees F oral Pulse rate:   96 / minute Pulse rhythm:   regular BP sitting:   128 / 84  (left arm) Cuff size:   regular  Vitals Entered By: Lewanda Rife LPN (November 12, 2009 12:15 PM) CC: sorethroat (husband has strep throat), head congestion and bumps on fingers   History of Present Illness: was exp to strep sore throat and congestion and tired   hard to tell if it is a cold  no facial pain  green nasal discharge   is coughig also  not productive  hurts her throat to cough  voice is ok   no n/v/d   no fever or chills   also gets bumps on her fingers in the wintertime nothing in it -just a sore bump - cracks open and peels  has tried neosporin they get really sore   Allergies: 1)  ! * Nifedipine 2)  ! Zoloft 3)  ! Lexapro 4)  ! Claritin 5)  ! Zyrtec Allergy 6)  ! * Lisinopril 7)  Erythromycin  Past History:  Past Medical History: Last updated: 09/01/2007 Allergic rhinitis Asthma Depression GERD Hypertension fibromyalgia deg disc disease Sleep Apnea fibula fx 2009  Past Surgical History: Last updated: 09/01/2007 Laparoscopy- endometriosis- several Left nipple removed- benign tumor Left knee surgery (1996)- small tear ACL Bunionectomy Tonsillectomy Laparoscopy- endometriosis (08/2004) MRI LS- mild scoliosis (09/2002) C-S MRI - spur, central disk herniation C6-7, T 1-3 (10/2002) Arm fracture, radial head (07/2005) hyst 7/08 for endometriosis and L ovary remains fx fibula  2009  Family History: Last updated: 09/29/2006 Father: DM Mother: HTN, fibromyalgia, thyroid problems Siblings: 1  Social History: Last updated: 10/03/2007 Marital Status: Married Children: 1 daughter Occupation: disabled- fibromyalgia  non smoker  Risk Factors: Smoking Status: never  (09/29/2006)  Review of Systems General:  Complains of chills and fatigue; denies loss of appetite. Eyes:  Denies blurring, discharge, and eye irritation. ENT:  Complains of hoarseness, nasal congestion, postnasal drainage, and sore throat; denies earache. CV:  Denies chest pain or discomfort, palpitations, and shortness of breath with exertion. Resp:  Complains of cough and sputum productive; denies pleuritic, shortness of breath, and wheezing. GI:  Denies diarrhea, nausea, and vomiting. MS:  Complains of joint pain and muscle aches. Derm:  Complains of insect bite(s) and rash.  Physical Exam  General:  overweight but generally well appearing  Head:  normocephalic, atraumatic, and no abnormalities observed.   Eyes:  vision grossly intact, pupils equal, pupils round, and pupils reactive to light.  no conjunctival pallor, injection or icterus  Ears:  R ear normal and L ear normal.   Nose:  nares are injected and congested bilaterally  Mouth:  pharynx pink and moist, no erythema, and no exudates.  some clear post nasal drip  Neck:  supple with full rom and no masses or thyromegally, no JVD or carotid bruit  Lungs:  CTA  harsh bs at bases no rales or rhonchi  Heart:  Normal rate and regular rhythm. S1 and S2 normal without gallop, murmur, click, rub or other extra sounds. Skin:  some dry skin on hands with peeling in fingers few dry papules resembling eczema  Cervical Nodes:  No lymphadenopathy noted Psych:  normal  affect, talkative and pleasant    Impression & Recommendations:  Problem # 1:  URI (ICD-465.9) Assessment New  recommend sympt care- see pt instructions   guifen ac for nite cough pt advised to update me if symptoms worsen or do not improve  Her updated medication list for this problem includes:    Guaifenesin Ac 100-10 Mg/53ml Syrp (Guaifenesin-codeine) .Marland Kitchen... 1-2 teaspoons by mouth at bedtime as needed cough  warn- may sedate  Orders: Rapid Strep  (14782) Prescription Created Electronically [phone])  Problem # 2:  COMMUNICABLE DISEASE, EXPOSURE TO (ICD-V01.9) Assessment: New  recent exp to strep neg rapid strep today given px for amox to take on trip in case she develops symptoms/ worsens  Orders: Rapid Strep (30865) Prescription Created Electronically [phone])  Problem # 3:  DYSHIDROTIC ECZEMA, HANDS (ICD-705.81) Assessment: New  counseled on avoidance of hot water and harsh soaps  disc use of moisturizers  elicon cream once daily as needed - update if not improved   Orders: Prescription Created Electronically [phone])  Complete Medication List: 1)  Advair Diskus 250-50 Mcg/dose Misc (Fluticasone-salmeterol) .... Inhale 1 puff as directed twice a day 2)  Proair Hfa 108 (90 Base) Mcg/act Aers (Albuterol sulfate) .... 2 puffs q 4 hours as needed wheeze 3)  Cpap 11 [facility]  4)  Singulair 10 Mg Tabs (Montelukast sodium) .Marland Kitchen.. 1 by mouth once daily for asthma and allergy symptoms 5)  Benicar 40 Mg Tabs (Olmesartan medoxomil) .Marland Kitchen.. 1 by mouth once daily 6)  Nasonex 50 Mcg/act Susp (Mometasone furoate) .... 2 puffs each nostril once daily 7)  Elocon 0.1 % Crea (Mometasone furoate) .... Apply to affected areas on hands once daily as needed 8)  Amoxicillin 500 Mg Caps (Amoxicillin) .Marland Kitchen.. 1 by mouth three times a day for 10 days 9)  Guaifenesin Ac 100-10 Mg/70ml Syrp (Guaifenesin-codeine) .Marland Kitchen.. 1-2 teaspoons by mouth at bedtime as needed cough  warn- may sedate  Patient Instructions: 1)  try elicon cream for bumps on fingers 2)  I think it is a type of eczema  3)  also avoid hot water and harsh soaps  4)  use non scented moisturizer often - like lubriderm or eucerin  5)  hold px for amoxicillin-- if your sore throat worsens take it  6)  you can try mucinex over the counter twice daily as directed and nasal saline spray for congestion 7)  tylenol over the counter as directed may help with aches, headache and fever 8)  call  if symptoms worsen or if not improved in 4-5 days  Prescriptions: GUAIFENESIN AC 100-10 MG/5ML SYRP (GUAIFENESIN-CODEINE) 1-2 teaspoons by mouth at bedtime as needed cough  warn- may sedate  #120cc x 0   Entered and Authorized by:   Judith Part MD   Signed by:   Judith Part MD on 11/12/2009   Method used:   Print then Give to Patient   RxID:   [id number] AMOXICILLIN 500 MG CAPS (AMOXICILLIN) 1 by mouth three times a day for 10 days  #30 x 0   Entered and Authorized by:   Judith Part MD   Signed by:   Judith Part MD on 11/12/2009   Method used:   Print then Give to Patient   RxID:   [id number] ELOCON 0.1 % CREA (MOMETASONE FUROATE) apply to affected areas on hands once daily as needed  #1 small x 1   Entered and Authorized by:   Judith Part MD  Signed by:   Judith Part MD on 11/12/2009   Method used:   [facility]  [address] [id number]       [address]       [place], [place]  [address]       Ph: [phone] or [phone]       Fax: [phone]   RxID:   [id number]    Orders Added: 1)  Est. Patient Level IV [36644] 2)  Rapid Strep [03474] 3)  Prescription Created Electronically [id number]    Current Allergies (reviewed today): ! * NIFEDIPINE ! ZOLOFT ! LEXAPRO ! CLARITIN ! ZYRTEC ALLERGY ! * LISINOPRIL ERYTHROMYCIN  Laboratory Results  Date/Time Received: November 12, 2009 12:16 PM  Date/Time Reported: November 12, 2009 12:16 PM   Other Tests  Rapid Strep: negative

## 2010-02-06 NOTE — Progress Notes (Signed)
Summary: c/o nasal congestion  Phone Note Call from Patient   Caller: Patient Call For: YOUNG Summary of Call: WANT TO KNOW IF SHE CAN GET SOMETHING FOR STUFFY ITCY NASAL [facility] [address] [address] Initial call taken by: Rickard Patience,  September 06, 2009 9:49 AM  Follow-up for Phone Call        Spoke with pt.  She c/o nasal congestion x 2 months- not improving on zyrtec and clariton.  She states that she is not even able to blow her nose, breathe through her nose at all and she also c/o nose feeling itchy.  Would like rx.  Pls advise, thanks! Allergies (verified):  1)  ! * Nifedipine 2)  ! Zoloft 3)  ! Lexapro 4)  ! Claritin 5)  ! Zyrtec Allergy 6)  ! * Lisinopril 7)  Erythromycin Follow-up by: Vernie Murders,  September 06, 2009 10:01 AM  Additional Follow-up for Phone Call Additional follow up Details #1::        Suggest she try signing at pharmacy counter for allegra- D 12 hour. It may also help to do nasal saline rises- Neti pot. If these don't help through weekend then call back Tuesday.    Additional Follow-up for Phone Call Additional follow up Details #2::    Spoke with pt and advised her to try allegra-d 12 hr. and netti pot.  She states that she has already tried the allegra-d 12 hr and also has tried netti pot "on and off" without relief.  Would like to know if there is some prescription med we can call in. Pls advise, thanks Follow-up by: Vernie Murders,  September 06, 2009 10:26 AM  Additional Follow-up for Phone Call Additional follow up Details #3:: Details for Additional Follow-up Action Taken: per CY use Afrin once or twice daily x 3 days to open nose, at the same time start Nasonex 2 puffs each nostril once daily and continue this daily #1, no refill. Carron Curie CMA  September 06, 2009 2:10 PM  pt advised of recs and rx sent. Carron Curie CMA  September 06, 2009 2:13 PM   New/Updated Medications: NASONEX 50 MCG/ACT SUSP (MOMETASONE FUROATE) 2 puffs each  nostril once daily AFRIN NASAL SPRAY 0.05 % SOLN (OXYMETAZOLINE HCL) once or twice daily x 3 days only Prescriptions: NASONEX 50 MCG/ACT SUSP (MOMETASONE FUROATE) 2 puffs each nostril once daily  #1 x 0   Entered by:   Carron Curie CMA   Authorized by:   Waymon Budge MD   Signed by:   Carron Curie CMA on 09/06/2009   Method used:   [facility]        [facility]  [address] [phone][facility] ([facility]ail[facility]       [address]       [place], [place]  [address]       Ph: [phone]       Fax: [phone]   RxID:   [id number]

## 2010-02-06 NOTE — Progress Notes (Signed)
Summary: Advair PA-pt needs to contact insurance co  Phone Note Outgoing Call   Call placed by: Gweneth Dimitri RN,  June 07, 2009 5:10 PM Summary of Call: Received request to initiate [facility] Auth [facility]  Called [facility] at 1 [phone] to initiate Prior Auth.  Spoke with [employer].  Per Dorise Hiss, the pt the's insurance plan is inactive as of 06/04/09 and pt needs to contact primary insuance provider.  Once insurance is active again, the prior auth process can be continued.   Initial call taken by: Gweneth Dimitri RN,  June 07, 2009 5:12 PM  Follow-up for Phone Call        ATC pt to see if she has recently changed insurance companies, etc.  [facility] Gweneth Dimitri RN  June 07, 2009 5:13 PM  Pt states she ahs a new rx card and took it to pharmacy and they filled her meds so nothign further needed. Carron Curie CMA  June 10, 2009 3:45 PM

## 2010-02-06 NOTE — Assessment & Plan Note (Signed)
Summary: festering boil/alc   Vital Signs:  Patient profile:   45 year old female Height:      66.5 inches Weight:      185 pounds BMI:     29.52 Temp:     98.9 degrees F oral Pulse rate:   96 / minute Pulse rhythm:   regular BP sitting:   140 / 88  (left arm) Cuff size:   regular  Vitals Entered By: Benny Lennert CMA Duncan Dull) (April 29, 2009 11:17 AM)  History of Present Illness: Chief complaint boil  45 year old presents with cellulitis, abcess that has been present on her L inner leg for about 3 weeks intermittently. Has busted twice and whitish, yellowish fluid has been discharged. Better now, but continues to have some redness and tenderness. No fever.   Allergies: 1)  ! * Nifedipine 2)  ! Zoloft 3)  ! Lexapro 4)  ! Claritin 5)  ! Zyrtec Allergy 6)  ! * Lisinopril 7)  Erythromycin  Past History:  Past medical, surgical, family and social histories (including risk factors) reviewed, and no changes noted (except as noted below).  Past Medical History: Reviewed history from 09/01/2007 and no changes required. Allergic rhinitis Asthma Depression GERD Hypertension fibromyalgia deg disc disease Sleep Apnea fibula fx 2009  Past Surgical History: Reviewed history from 09/01/2007 and no changes required. Laparoscopy- endometriosis- several Left nipple removed- benign tumor Left knee surgery (1996)- small tear ACL Bunionectomy Tonsillectomy Laparoscopy- endometriosis (08/2004) MRI LS- mild scoliosis (09/2002) C-S MRI - spur, central disk herniation C6-7, T 1-3 (10/2002) Arm fracture, radial head (07/2005) hyst 7/08 for endometriosis and L ovary remains fx fibula  2009  Family History: Reviewed history from 09/29/2006 and no changes required. Father: DM Mother: HTN, fibromyalgia, thyroid problems Siblings: 1  Social History: Reviewed history from 10/03/2007 and no changes required. Marital Status: Married Children: 1 daughter Occupation: disabled-  fibromyalgia  non smoker  Review of Systems      See HPI General:  Denies chills, fatigue, and fever. Derm:  See HPI; Complains of lesion(s).  Physical Exam  General:  GEN: Well-developed,well-nourished,in no acute distress; alert,appropriate and cooperative throughout examination HEENT: Normocephalic and atraumatic without obvious abnormalities. No apparent alopecia or balding. Ears, externally no deformities PULM: Breathing comfortably in no respiratory distress EXT: No clubbing, cyanosis, or edema PSYCH: Normally interactive. Cooperative during the interview. Pleasant. Friendly and conversant. Not anxious or depressed appearing. Normal, full affect.  Skin:  L inner thigh with an area that is reddish in apperance with a centralized area that is more red and healing. No induration and no fluctuance   Impression & Recommendations:  Problem # 1:  CELLULITIS AND ABSCESS OF UNSPECIFIED SITE (ICD-682.9) Assessment New Cellulitis of leg, do not think drainable. Moist heat and ABX including MRSA coverage. If becomes worse, more indurated or fluctuant RTC  Her updated medication list for this problem includes:    Septra Ds 800-160 Mg Tabs (Sulfamethoxazole-trimethoprim) .Marland Kitchen... 2  by mouth twice daily    Cephalexin 500 Mg Tabs (Cephalexin) .Marland Kitchen... Take one by mouth 4 times daily  Problem # 2:  ABSCESS, LEG (ICD-682.6) Assessment: New  Her updated medication list for this problem includes:    Septra Ds 800-160 Mg Tabs (Sulfamethoxazole-trimethoprim) .Marland Kitchen... 2  by mouth twice daily    Cephalexin 500 Mg Tabs (Cephalexin) .Marland Kitchen... Take one by mouth 4 times daily  Complete Medication List: 1)  Advair Diskus 250-50 Mcg/dose Misc (Fluticasone-salmeterol) .... Inhale 1 puff as directed  twice a day 2)  Proair Hfa 108 (90 Base) Mcg/act Aers (Albuterol sulfate) .... 2 puffs q 4 hours as needed wheeze 3)  Cpap 13  4)  Flonase 50 Mcg/act Susp (Fluticasone propionate) .... 2 sprays in each nostril once  daily 5)  Singulair 10 Mg Tabs (Montelukast sodium) .Marland Kitchen.. 1 by mouth once daily for asthma and allergy symptoms 6)  Benicar 40 Mg Tabs (Olmesartan medoxomil) .Marland Kitchen.. 1 by mouth once daily 7)  Naprosyn 500 Mg Tabs (Naproxen) .Marland Kitchen.. 1 by mouth two times a day with food as needed back pain 8)  Septra Ds 800-160 Mg Tabs (Sulfamethoxazole-trimethoprim) .... 2  by mouth twice daily 9)  Cephalexin 500 Mg Tabs (Cephalexin) .... Take one by mouth 4 times daily Prescriptions: CEPHALEXIN 500 MG  TABS (CEPHALEXIN) take one by mouth 4 times daily  #40 x 0   Entered and Authorized by:   Hannah Beat MD   Signed by:   Hannah Beat MD on 04/29/2009   Method used:   [facility]        [facility]  [address] [phone][facility] (retail)       [address]       [place], [place]  [address]       Ph: [phone]       Fax: [phone]   RxID:   [id number] SEPTRA DS 800-160 MG TABS (SULFAMETHOXAZOLE-TRIMETHOPRIM) 2  by mouth twice daily  #40 x 0   Entered and Authorized by:   Hannah Beat MD   Signed by:   Hannah Beat MD on 04/29/2009   Method used:   [facility]        [facility]  [address] [phone]* (retail)       [address]       [place], [place]  [address]       Ph: [phone]       Fax: [phone]   RxID:   [id number]   Current Allergies (reviewed today): ! * NIFEDIPINE ! ZOLOFT ! LEXAPRO ! CLARITIN ! ZYRTEC ALLERGY ! * LISINOPRIL ERYTHROMYCIN

## 2010-03-07 ENCOUNTER — Encounter (INDEPENDENT_AMBULATORY_CARE_PROVIDER_SITE_OTHER): Payer: Self-pay | Admitting: *Deleted

## 2010-03-11 ENCOUNTER — Other Ambulatory Visit: Payer: Self-pay | Admitting: Obstetrics and Gynecology

## 2010-03-11 DIAGNOSIS — R928 Other abnormal and inconclusive findings on diagnostic imaging of breast: Secondary | ICD-10-CM

## 2010-03-13 NOTE — Letter (Signed)
Summary: New Patient letter  [facility]  [address] [place], [place] [address]   Phone: [phone]  Fax: [phone]       03/07/2010 MRN: [id number]  [facility] [address] [place] , [place]  [address]  Dear Ms. Lezcano,  Welcome to the Gastroenterology Division at [facility].    You are scheduled to see Dr.  Christella Hartigan on 04-21-10 at 9:00A.M. on the 3rd floor at [facility], [address]  We ask that you try to arrive at our office 15 minutes prior to your appointment time to allow for check-in.  We would like you to complete the enclosed self-administered evaluation form prior to your visit and bring it with you on the day of your appointment.  We will review it with you.  Also, please bring a complete list of all your medications or, if you prefer, bring the medication bottles and we will list them.  Please bring your insurance card so that we may make a copy of it.  If your insurance requires a referral to see a specialist, please bring your referral form from your primary care physician.  Co-payments are due at the time of your visit and may be paid by cash, check or credit card.     Your office visit will consist of a consult with your physician (includes a physical exam), any laboratory testing he/she may order, scheduling of any necessary diagnostic testing (e.g. x-ray, ultrasound, CT-scan), and scheduling of a procedure (e.g. Endoscopy, Colonoscopy) if required.  Please allow enough time on your schedule to allow for any/all of these possibilities.    If you cannot keep your appointment, please call [phone] to cancel or reschedule prior to your appointment date.  This allows [place] the opportunity to schedule an appointment for another patient in need of care.  If you do not cancel or reschedule by 5 p.m. the business day prior to your appointment date, you will be charged a $50.00 late cancellation/no-show fee.    Thank you for choosing  [facility] for your medical needs.  We appreciate the opportunity to care for you.  Please visit [place] at our website  to learn more about our practice.                     Sincerely,                                                             The Gastroenterology Division

## 2010-03-14 ENCOUNTER — Encounter: Payer: Self-pay | Admitting: Internal Medicine

## 2010-03-14 ENCOUNTER — Ambulatory Visit (INDEPENDENT_AMBULATORY_CARE_PROVIDER_SITE_OTHER): Payer: BC Managed Care – PPO | Admitting: Internal Medicine

## 2010-03-14 DIAGNOSIS — J309 Allergic rhinitis, unspecified: Secondary | ICD-10-CM

## 2010-03-17 ENCOUNTER — Ambulatory Visit
Admission: RE | Admit: 2010-03-17 | Discharge: 2010-03-17 | Disposition: A | Discharge status: Home / Self Care | Payer: BC Managed Care – PPO | Source: Ambulatory Visit | Attending: Obstetrics and Gynecology | Admitting: Obstetrics and Gynecology

## 2010-03-17 DIAGNOSIS — R928 Other abnormal and inconclusive findings on diagnostic imaging of breast: Secondary | ICD-10-CM

## 2010-03-19 ENCOUNTER — Encounter: Payer: Self-pay | Admitting: Family Medicine

## 2010-03-20 ENCOUNTER — Telehealth (INDEPENDENT_AMBULATORY_CARE_PROVIDER_SITE_OTHER): Payer: Self-pay | Admitting: *Deleted

## 2010-03-25 ENCOUNTER — Ambulatory Visit (INDEPENDENT_AMBULATORY_CARE_PROVIDER_SITE_OTHER): Payer: BC Managed Care – PPO | Admitting: Family Medicine

## 2010-03-25 ENCOUNTER — Ambulatory Visit: Payer: BC Managed Care – PPO | Admitting: Family Medicine

## 2010-03-25 ENCOUNTER — Encounter: Payer: Self-pay | Admitting: Family Medicine

## 2010-03-25 DIAGNOSIS — IMO0001 Reserved for inherently not codable concepts without codable children: Secondary | ICD-10-CM

## 2010-03-25 DIAGNOSIS — I10 Essential (primary) hypertension: Secondary | ICD-10-CM

## 2010-03-25 LAB — CBC WITH DIFFERENTIAL/PLATELET
Basophils Relative: 0.6 % (ref 0.0–3.0)
Eosinophils Relative: 2.6 % (ref 0.0–5.0)
Lymphocytes Relative: 33.3 % (ref 12.0–46.0)
MCV: 86.7 fl (ref 78.0–100.0)
Monocytes Absolute: 0.7 10*3/uL (ref 0.1–1.0)
Monocytes Relative: 7.3 % (ref 3.0–12.0)
Neutrophils Relative %: 56.2 % (ref 43.0–77.0)
Platelets: 259 10*3/uL (ref 150.0–400.0)
RBC: 4.95 Mil/uL (ref 3.87–5.11)
WBC: 9.2 10*3/uL (ref 4.5–10.5)

## 2010-03-25 LAB — COMPREHENSIVE METABOLIC PANEL
Albumin: 4 g/dL (ref 3.5–5.2)
Alkaline Phosphatase: 87 U/L (ref 39–117)
BUN: 10 mg/dL (ref 6–23)
CO2: 30 mEq/L (ref 19–32)
GFR: 80.96 mL/min (ref >60.00)
Glucose, Bld: 77 mg/dL (ref 70–99)
Potassium: 4.3 mEq/L (ref 3.5–5.1)
Total Protein: 7.3 g/dL (ref 6.0–8.3)

## 2010-03-25 LAB — LIPID PANEL
Cholesterol: 177 mg/dL (ref 0–200)
HDL: 62.3 mg/dL (ref >39.00)
VLDL: 11.2 mg/dL (ref 0.0–40.0)

## 2010-03-25 LAB — TSH: TSH: 3.84 u[IU]/mL (ref 0.35–5.50)

## 2010-03-25 MED ORDER — TRAMADOL HCL 50 MG PO TABS: 50.0000 mg | ORAL_TABLET | Freq: Three times a day (TID) | ORAL | Status: AC | PRN

## 2010-03-25 MED ORDER — METHOCARBAMOL 500 MG PO TABS: 500.0000 mg | ORAL_TABLET | Freq: Every evening | ORAL | Status: AC | PRN

## 2010-03-25 NOTE — Assessment & Plan Note (Signed)
Overall unchanged Disability forms filled out - unable to work  Enc light low impact exercise Trial again of ultram and robaxin Prn

## 2010-03-25 NOTE — Progress Notes (Signed)
  Subjective:    Patient ID: [name], female    DOB: 06/16/1965, 45 y.o.   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 Negative for fever, activity change, appetite change and unexpected weight change.  Eyes: Negative for visual disturbance.  Respiratory: Negative for shortness of breath and wheezing.   Cardiovascular: Negative for chest pain, palpitations and leg swelling.  Gastrointestinal: Negative for vomiting and abdominal pain.  Genitourinary: Negative for frequency.  Musculoskeletal: Positive for back pain and arthralgias. Negative for joint swelling and gait problem.  Skin: Negative for pallor and rash.  Neurological: Negative for syncope, weakness, light-headedness and numbness.  Hematological: Does not bruise/bleed easily.  Psychiatric/Behavioral: Positive for decreased concentration. Negative for confusion.       Objective:   Physical Exam  Constitutional: She appears well-developed and well-nourished. No distress.       overwt and well appearing   HENT:  Head: Normocephalic and atraumatic.  Eyes: Conjunctivae are normal. Pupils are equal, round, and reactive to light.  Neck: Neck supple. No JVD present. No thyromegaly present.  Cardiovascular: Normal rate, regular rhythm and normal heart sounds.   Pulmonary/Chest: Effort normal and breath sounds normal. She exhibits no tenderness.  Abdominal: There is no tenderness.  Musculoskeletal: She exhibits tenderness. She exhibits no edema.       Left  shoulder: She exhibits decreased range of motion, tenderness and pain. She exhibits no swelling and no crepitus.       Some myofascial trigger points on back    Lymphadenopathy:    She has no cervical adenopathy.  Neurological: She is alert. She has normal strength. She displays normal reflexes. No cranial nerve deficit or sensory deficit. Coordination and gait normal.  Skin: Skin is warm and dry. No rash noted.          Assessment & Plan:

## 2010-03-25 NOTE — Assessment & Plan Note (Signed)
Summary: congestion//jd   Primary Provider/Referring Provider:  Roxy Manns  CC:  Acute visit-allergy attack Tuesday night-tickle in nose;caused sneezing, watery eyes, and runny nose.Used allergy meds and has calmed down a bit but still having sinus issues..  History of Present Illness: November 18, 2009-  Insomnia with OSA, Allergic rhinitis, asthma Nurse-CC: 6 month follow up visit-OSA-no complaints; Recent bout of URI-abx from PCP-amoxicillin-getting better. She had to stop CPAP for awhile last week when she had a sinus infection treated by Dr Milinda Antis with antibiotic and now much improved.  Otherwise she has remained comfortable and compliant at 10 cwp.  Continues Adair and Singulair. Before she got sick she was using Proair 1-2x/ week. had flu shot.  Nose always stuffy, not helped much by Allegra D, Nasonex. Tried nasal stips in past. Considered ENT evaluation for reduction of turbinates- she decidecd to wait.   March 14, 2010-  Insomnia with OSA, Allergic rhinitis, asthma Nurse-CC: Acute visit-allergy attack Tuesday night-tickle in nose;caused sneezing,watery eyes, runny nose.Used allergy meds and has calmed down a bit but still having sinus issues. Right nostril got irritated one night while wearing CPAP- tickled something,. Had to leave CPAP off until last night. used Allegra- D and benadryl. Off nasonex,  she's not sure why.    Preventive Screening-Counseling & Management  Alcohol-Tobacco     Smoking Status: never  Current Medications (verified): 1)  Advair Diskus 250-50 Mcg/dose Misc (Fluticasone-Salmeterol) .... Inhale 1 Puff As Directed Twice A Day 2)  Proair Hfa 108 (90 Base) Mcg/act  Aers (Albuterol Sulfate) .... 2 Puffs Q 4 Hours As Needed Wheeze 3)  Cpap 11 [facility] 4)  Singulair 10 Mg Tabs (Montelukast Sodium) .Marland Kitchen.. 1 By Mouth Once Daily For Asthma and Allergy Symptoms 5)  Benicar 40 Mg Tabs (Olmesartan Medoxomil) .Marland Kitchen.. 1 By Mouth Once Daily 6)  Astepro 0.15 %  Soln (Azelastine Hcl) .Marland Kitchen.. 1-2 Sprays in Each Nostril Two Times A Day As Needed 7)  Elocon 0.1 % Crea (Mometasone Furoate) .... Apply To Affected Areas On Hands Once Daily As Needed  Allergies (verified): 1)  ! * Nifedipine 2)  ! Zoloft 3)  ! Lexapro 4)  ! Claritin 5)  ! Zyrtec Allergy 6)  ! * Lisinopril 7)  Erythromycin  Past History:  Past Medical History: Last updated: 09/01/2007 Allergic rhinitis Asthma Depression GERD Hypertension fibromyalgia deg disc disease Sleep Apnea fibula fx 2009  Past Surgical History: Last updated: 09/01/2007 Laparoscopy- endometriosis- several Left nipple removed- benign tumor Left knee surgery (1996)- small tear ACL Bunionectomy Tonsillectomy Laparoscopy- endometriosis (08/2004) MRI LS- mild scoliosis (09/2002) C-S MRI - spur, central disk herniation C6-7, T 1-3 (10/2002) Arm fracture, radial head (07/2005) hyst 7/08 for endometriosis and L ovary remains fx fibula  2009  Family History: Last updated: 09/29/2006 Father: DM Mother: HTN, fibromyalgia, thyroid problems Siblings: 1  Social History: Last updated: 11/18/2009 Marital Status: Married Children: 1 daughter Occupation: disabled- fibromyalgia  Patient never smoked.   Risk Factors: Smoking Status: never (03/14/2010)  Review of Systems      See HPI       The patient complains of nasal congestion/difficulty breathing through nose.  The patient denies shortness of breath with activity, shortness of breath at rest, productive cough, non-productive cough, coughing up blood, chest pain, irregular heartbeats, acid heartburn, indigestion, loss of appetite, weight change, abdominal pain, difficulty swallowing, sore throat, tooth/dental problems, headaches, sneezing, itching, ear ache, hand/feet swelling, joint stiffness or pain, rash, change in color of mucus, and  fever.    Vital Signs:  Patient profile:   45 year old female Height:      66.5 inches Weight:      188.50  pounds BMI:     30.08 O2 Sat:      98 % on Room air Pulse rate:   94 / minute BP sitting:   124 / 86  (left arm) Cuff size:   regular  Vitals Entered By: Reynaldo Minium CMA (March 14, 2010 11:35 AM)  O2 Flow:  Room air CC: Acute visit-allergy attack Tuesday night-tickle in nose;caused sneezing,watery eyes, runny nose.Used allergy meds and has calmed down a bit but still having sinus issues.   Physical Exam  Additional Exam:  General: A/Ox3; pleasant and cooperative, NAD, Medium build,  calm and appropriate SKIN: no rash, lesions NODES: no lymphadenopathy HEENT: [place]/AT, EOM- WNL, Conjuctivae- clear, PERRLA, TM-WNL, Nose- mucus, snuffling and rubbing nose, turbinate prominence., Throat- clear and wnl, Mallampati III NECK: Supple w/ fair ROM, JVD- none, normal carotid impulses w/o bruits Thyroid- normal to palpation CHEST: Clear to P&A HEART: RRR, no m/g/r heard ABDOMEN: Soft and nl; nml bowel sounds; ZOX:WRUE, nl pulses, no edema  NEURO: Grossly intact to observation     Impression & Recommendations:  Problem # 1:  OBSTRUCTIVE SLEEP APNEA (ICD-327.23) Ok but rhinitis is interfering with CPAP use right now. Otherwise control and compliance with CPAP have been good.  Problem # 2:  ALLERGIC RHINITIS (ICD-477.9)  Seasonal exacerbation. We will get her restarted on nasal steroid and give depo today so she can wear her mask.  Her updated medication list for this problem includes:    Astepro 0.15 % Soln (Azelastine hcl) .Marland Kitchen... 1-2 sprays in each nostril two times a day as needed    Nasonex 50 Mcg/act Susp (Mometasone furoate) .Marland Kitchen... 1-2 puffs each nostril once daily  Medications Added to Medication List This Visit: 1)  Astepro 0.15 % Soln (Azelastine hcl) .Marland Kitchen.. 1-2 sprays in each nostril two times a day as needed 2)  Nasonex 50 Mcg/act Susp (Mometasone furoate) .Marland Kitchen.. 1-2 puffs each nostril once daily  Other Orders: Depo- Medrol 80mg  (J1040) Admin of Therapeutic Inj  intramuscular or  subcutaneous ([id number])  Patient Instructions: 1)  Please schedule a follow-up appointment in 2 months. 2)  depo 80 3)  Nasonex  Prescriptions: NASONEX 50 MCG/ACT SUSP (MOMETASONE FUROATE) 1-2 puffs each nostril once daily  #1 x prn   Entered and Authorized by:   Waymon Budge MD   Signed by:   Waymon Budge MD on 03/14/2010   Method used:   Print then Give to Patient   RxID:   [id number]       Medication Administration  Injection # 1:    Medication: Depo- Medrol 80mg     Diagnosis: ALLERGIC RHINITIS (ICD-477.9)    Route: IM    Site: RUOQ gluteus    Exp Date: 07/2012    Lot #: [id number]    Mfr: Pharmacia    Patient tolerated injection without complications    Given by: Carver Fila (March 14, 2010 12:02 PM)  Orders Added: 1)  Depo- Medrol 80mg  [J1040] 2)  Admin of Therapeutic Inj  intramuscular or subcutaneous [[id number]]

## 2010-03-25 NOTE — Progress Notes (Signed)
Summary: transfer cpap rx from [facility] to [facility]  Phone Note Call from Patient Call back at [phone]   Caller: Patient Call For: young Summary of Call: Pt states she needs to transfer her rx for he cpap from [facility] and she needs assistance to do this pls advise. Initial call taken by: Darletta Moll,  March 20, 2010 12:42 PM  Follow-up for Phone Call        called and spoke with pt.  pt states she currently gets her cpap machine/supplies through [facility].  Pt states she has not been pleased with their services and therefore would like to switch to [facility].  Will send order to [facility] to switch pt from [facility] to [facility]. Arman Filter LPN  March 20, 2010 2:47 PM

## 2010-03-25 NOTE — Patient Instructions (Signed)
Try the tramadol and the robaxin for pain Keep up low impact exercise We will call you to pick up the disability form  Labs today Blood pressure is good

## 2010-03-25 NOTE — Assessment & Plan Note (Signed)
Stable- well controlled on benicar Labs today

## 2010-03-27 ENCOUNTER — Encounter: Payer: Self-pay | Admitting: Family Medicine

## 2010-04-01 ENCOUNTER — Telehealth: Payer: Self-pay

## 2010-04-01 NOTE — Telephone Encounter (Signed)
Message copied by Lewanda Rife on Tue Apr 01, 2010  6:57 PM ------      Message from: Roxy Manns      Created: Thu Mar 27, 2010 10:18 AM       Labs all look ok including cholesterol that is stable

## 2010-04-01 NOTE — Telephone Encounter (Signed)
Patient notified as instructed by phone tree  

## 2010-04-02 ENCOUNTER — Telehealth: Payer: Self-pay | Admitting: *Deleted

## 2010-04-02 NOTE — Telephone Encounter (Signed)
I filled out her forms for disability ... But did not know she also needed a letter? - please clarify that  I finished forms and put them in my out box with inst to copy for scan and have pt pick up (I did that Monday am)

## 2010-04-02 NOTE — Telephone Encounter (Signed)
Patient notified as instructed by telephone. Pt thought Dr Milinda Antis told her the day of the visit she had to do a letter. Dr Milinda Antis said she told her she had to finish the note (in the chart). Pt will ck to make sure the disablity form was received.[name] believes it was faxed if not in front office for pick up.

## 2010-04-02 NOTE — Telephone Encounter (Signed)
Pt was seen last week.  She says you were going to write her a letter regarding her disability and she is asking if this is ready.  Please advise.

## 2010-04-15 ENCOUNTER — Telehealth: Payer: Self-pay

## 2010-04-15 NOTE — Telephone Encounter (Signed)
Pt called today and the form for disablity has not been received yet. I sent copy for scanning and retained copy at my desk. I faxed form to [phone] and copy at front desk for pt to pick up.

## 2010-04-21 ENCOUNTER — Ambulatory Visit (INDEPENDENT_AMBULATORY_CARE_PROVIDER_SITE_OTHER): Payer: BC Managed Care – PPO | Admitting: Gastroenterology

## 2010-04-21 ENCOUNTER — Encounter: Payer: Self-pay | Admitting: Gastroenterology

## 2010-04-21 DIAGNOSIS — Z83719 Family history of colon polyps, unspecified: Secondary | ICD-10-CM

## 2010-04-21 DIAGNOSIS — R109 Unspecified abdominal pain: Secondary | ICD-10-CM

## 2010-04-21 DIAGNOSIS — Z8371 Family history of colonic polyps: Secondary | ICD-10-CM

## 2010-04-21 MED ORDER — PEG-KCL-NACL-NASULF-NA ASC-C 100 G PO SOLR: 1.0000 | ORAL | Status: DC

## 2010-04-21 NOTE — Patient Instructions (Signed)
Your symptoms are most likely related to scar tissue (adhesions) in abdomen. You will be set up for a colonoscopy to make sure there are no polyps, tumors in colon (mom has polyps). A copy of this information will be made available to Dr. Henderson Cloud.

## 2010-04-21 NOTE — Progress Notes (Signed)
HPI: This is a very pleasant 45 year old woman  With several years of lower abd pains.  Cramping sensation, bilateral at times.  None in 1 month. They are intermittent, come in waves.  Most often occurs when standing up from laying down, last 30-60 seconds.  Then she feels fine again unless she lays down again.  Never happens after eating.  No relation to moving her bowels.  She has had 3 laparoscopies and eventual hysterectomy by Dr. Henderson Cloud for endometriosis over past several years (hysterectomy 2008, laps in 200. 02, and 06).  Was having a lot of bleeding.    Overall she has gained 10 pounds in the past year.  She has no real constipation or diarrhea.  No overt rectal bleeding.  Had stool testing last year for occult blood and it was negative by gyne.  Has another set of stool tests at home but hasn't sent them in yet.  Mother has colon polyps, just removed 10 polyps.  She has never had colonoscopy.  She had a basic set of labs last month including CBC, complete metabolic profile and these were all normal    Review of systems: Pertinent positive and negative review of systems were noted in the above HPI section.  All other review of systems was otherwise negative.   Past Medical History, Past Surgical History, Family History, Social History, Current Medications, Allergies were all reviewed with the patient via [facility] electronic medical record system.   Physical Exam: Vital signs from this visit reviewed Constitutional: generally well-appearing Psychiatric: alert and oriented x3 Eyes: extraocular movements intact Mouth: oral pharynx moist, no lesions Neck: supple no lymphadenopathy Cardiovascular: heart regular rate and rhythm Lungs: clear to auscultation bilaterally Abdomen: soft, nontender, nondistended, no obvious ascites, no peritoneal signs, normal bowel sounds Extremities: no lower extremity edema bilaterally Skin: no lesions on visible extremities    Assessment  and plan:  45 year old woman with chronic lower abdominal pains  Her pains are very brief, lasting 30-60 seconds and clearly related to position changes. She has had significant endometriosis in the past and I suspect she is having symptoms related to intra-abdominal adhesions, scar tissue. Her mom had colon polyps and I think we should proceed with full colonoscopy to screen her for polyps, cancers. I think it is unlikely that her symptoms are related to any primary gastrointestinal issue.

## 2010-04-22 ENCOUNTER — Encounter: Payer: Self-pay | Admitting: Gastroenterology

## 2010-04-22 ENCOUNTER — Ambulatory Visit (AMBULATORY_SURGERY_CENTER): Payer: BC Managed Care – PPO | Admitting: Gastroenterology

## 2010-04-22 VITALS — BP 133/82 | HR 85 | Temp 97.9°F | Resp 20 | Ht 67.0 in | Wt 188.0 lb

## 2010-04-22 DIAGNOSIS — R1084 Generalized abdominal pain: Secondary | ICD-10-CM

## 2010-04-22 DIAGNOSIS — R109 Unspecified abdominal pain: Secondary | ICD-10-CM

## 2010-04-22 DIAGNOSIS — Z8371 Family history of colonic polyps: Secondary | ICD-10-CM

## 2010-04-22 MED ORDER — SODIUM CHLORIDE 0.9 % IV SOLN: 500.0000 mL | INTRAVENOUS | Status: DC

## 2010-04-22 NOTE — Patient Instructions (Signed)
Normal colonoscopy. Recall at age 45  See green and blue sheets for additional d/c instructions.

## 2010-04-23 ENCOUNTER — Telehealth: Payer: Self-pay | Admitting: *Deleted

## 2010-04-23 NOTE — Telephone Encounter (Signed)

## 2010-04-28 ENCOUNTER — Ambulatory Visit: Payer: BC Managed Care – PPO | Admitting: Family Medicine

## 2010-05-05 ENCOUNTER — Encounter: Payer: Self-pay | Admitting: Internal Medicine

## 2010-05-06 ENCOUNTER — Ambulatory Visit (INDEPENDENT_AMBULATORY_CARE_PROVIDER_SITE_OTHER): Payer: BC Managed Care – PPO | Admitting: Internal Medicine

## 2010-05-06 ENCOUNTER — Encounter: Payer: Self-pay | Admitting: Internal Medicine

## 2010-05-06 VITALS — BP 120/86 | HR 79 | Ht 66.5 in | Wt 191.4 lb

## 2010-05-06 DIAGNOSIS — G4733 Obstructive sleep apnea (adult) (pediatric): Secondary | ICD-10-CM

## 2010-05-06 DIAGNOSIS — J309 Allergic rhinitis, unspecified: Secondary | ICD-10-CM

## 2010-05-06 DIAGNOSIS — J45909 Unspecified asthma, uncomplicated: Secondary | ICD-10-CM

## 2010-05-06 MED ORDER — FLUTICASONE-SALMETEROL 250-50 MCG/DOSE IN AEPB: INHALATION_SPRAY | RESPIRATORY_TRACT | Status: DC

## 2010-05-06 MED ORDER — MONTELUKAST SODIUM 10 MG PO TABS: 10.0000 mg | ORAL_TABLET | Freq: Every day | ORAL | Status: DC

## 2010-05-06 MED ORDER — ALBUTEROL SULFATE HFA 108 (90 BASE) MCG/ACT IN AERS: 2.0000 | INHALATION_SPRAY | RESPIRATORY_TRACT | Status: DC | PRN

## 2010-05-06 NOTE — Assessment & Plan Note (Addendum)
Now able to use her CPAP effectively with good compliance and control.  10 cwp [employer]

## 2010-05-06 NOTE — Assessment & Plan Note (Signed)
Steroid shot in march was a great help and benefit has lasted. Current meds are now sufficient

## 2010-05-06 NOTE — Patient Instructions (Signed)
Scripts were printed for mail-order, and sent to drug store for local use.   Continue with CPAP at 11

## 2010-05-06 NOTE — Progress Notes (Signed)
  Subjective:    Patient ID: [name], female    DOB: [birth year], 45 y.o.   MRN: [id number]  HPI 19 yoF followed for obstructive sleep apnea and allergic rhinitis, complicated by insomnia, GERD, depression. Last here March 9 when we gave depo shot which made her much better. She was able to go back to full time use of her CPAP machine - 11 cwp [employer]. We discussed her meds. She feels well today.    Review of Systems   see HPI Constitutional:   No weight loss, night sweats,  Fevers, chills, fatigue, lassitude. HEENT:   No headaches,  Difficulty swallowing,  Tooth/dental problems,  Sore throat,                No sneezing, itching, ear ache, nasal congestion, post nasal drip,   CV:  No chest pain,  Orthopnea, PND, swelling in lower extremities, anasarca, dizziness, palpitations  GI  No heartburn, indigestion, abdominal pain, nausea, vomiting, diarrhea, change in bowel habits, loss of appetite  Resp: No shortness of breath with exertion or at rest.  No excess mucus, no productive cough,  No non-productive cough,  No coughing up of blood.  No change in color of mucus.  No wheezing.  Skin: no rash or lesions.  GU: no dysuria, change in color of urine, no urgency or frequency.  No flank pain.  MS:  No joint pain or swelling.  No decreased range of motion.  No back pain.  Psych:  No change in mood or affect. No depression or anxiety.  No memory loss.   Objective:   Physical Exam General- Alert, Oriented, Affect-appropriate, Distress- none acute  Skin- rash-none, lesions- none, excoriation- none  Lymphadenopathy- none  Head- atraumatic  Eyes- Gross vision intact, PERRLA, conjunctivae clear, secretions  Ears- Normal-  Hearing, canals, Tm  Nose- Clear,  No- Septal dev, mucus, polyps, erosion, perforation   Throat- Mallampati II , mucosa clear , drainage- none, tonsils- atrophic  Neck- flexible , trachea midline, no stridor , thyroid nl, carotid no bruit  Chest -  symmetrical excursion , unlabored     Heart/CV- RRR , no murmur , no gallop  , no rub, nl s1 s2                     - JVD- none , edema- none, stasis changes- none, varices- none     Lung- clear to P&A, wheeze- none, cough- none , dullness-none, rub- none     Chest wall-  Abd- tender-no, distended-no, bowel sounds-present, HSM- no  Br/ Gen/ Rectal- Not done, not indicated  Extrem- cyanosis- none, clubbing, none, atrophy- none, strength- nl  Neuro- grossly intact to observation         Assessment & Plan:

## 2010-05-10 ENCOUNTER — Encounter: Payer: Self-pay | Admitting: Internal Medicine

## 2010-05-10 NOTE — Assessment & Plan Note (Signed)
Well controlled with no wheeze or tightness. Not needing rescue inhaler.

## 2010-05-19 ENCOUNTER — Ambulatory Visit: Payer: Self-pay | Admitting: Internal Medicine

## 2010-05-20 NOTE — Assessment & Plan Note (Signed)
[facility]                             PULMONARY OFFICE NOTE   NAME:[name], [name]                      MRN:          [id number]  DATE:11/11/2006                            DOB:          11/20/1965    DATE OF VISIT:  November 11, 2006.   PROBLEM:  Obstructive sleep apnea.   HISTORY:  Her sleep study confirmed a moderate obstructive apnea with an  index of 33 per hour, moderate to loud snoring, and desaturation to 89%.  CPAP titrated to a best control at 17, but adequate control with greater  comfort at 14 CWP.  I discussed again the physiology of sleep apnea,  available treatments, and her responsibility to keep her weight down and  to be alert while driving.  We are going to try CPAP at 14 CWP.   OBJECTIVE:  Weight 186 pounds, BP 146/90, pulse 89, room air saturation  97%.  She is alert, nasal airway is not obstructed, breathing unlabored,  pulse regular without murmur.   IMPRESSION:  Obstructive sleep apnea.   PLAN:  1. Education as above.  2. Home trial of CPAP 14 CWP.  3. She will schedule a return in one month, earlier p.r.n.     Clinton D. Maple Hudson, MD, Tonny Bollman, FACP  Electronically Signed    CDY/MedQ  DD: 11/11/2006  DT: 11/11/2006  Job #: [id number]   cc:   Marne A. Milinda Antis, MD

## 2010-05-20 NOTE — Op Note (Signed)
[name], [name]               ACCOUNT NO.:  [id number]   MEDICAL RECORD NO.:  [id number]          PATIENT TYPE:  OIB   LOCATION:  9308                          FACILITY:  [facility]   PHYSICIAN:  Guy Sandifer. Henderson Cloud, M.D. DATE OF BIRTH:  [birth year]   DATE OF PROCEDURE:  08/05/2006  DATE OF DISCHARGE:                               OPERATIVE REPORT   PREOPERATIVE DIAGNOSIS:  Endometriosis.   POSTOPERATIVE DIAGNOSIS:  Endometriosis.   PROCEDURE:  Laparoscopically-assisted vaginal hysterectomy with right  salpingo-oophorectomy.   SURGEON:  Guy Sandifer. Henderson Cloud, M.D.   ASSISTANT:  Dineen Kid. Rana Snare, M.D.   ANESTHESIA:  General with endotracheal intubation.   SPECIMENS:  Uterus, right tube and ovary to pathology.   ESTIMATED BLOOD LOSS:  200 mL.   INDICATIONS AND CONSENT:  This patient is a 45 year old married white  female G1, P1, with known endometriosis becoming increasingly  symptomatic.  The patient has most of her pain on the right side.  After  discussion of the options, A laparoscopically-assisted vaginal  hysterectomy, removal of the right tube and ovary, and removal of the  left tube and ovary only if distinctly abnormal, have been discussed.  Potential risks and complications have been reviewed including but not  limited to infection; bowel, bladder or ureteral damage; bleeding  requiring transfusion of blood products with possible transfusion  reaction, HIV and hepatitis acquisition; DVT, PE, pneumonia, fistula  formation, laparotomy, dyspareunia, postoperative pain.  All questions  have been answered and consent is signed on the chart.   FINDINGS:  Upper abdomen is grossly normal.  Appendix is normal.  Uterus  is about 6 weeks in size.  Anterior cul-de-sac is normal.  Tubes and  ovaries are normal bilaterally.  There is a peritoneal window consistent  with endometriosis lateral to the right uterosacral ligament.   PROCEDURE:  The patient is taken to the operating room, where  she is  identified and placed in the dorsal supine position and general  anesthesia is induced via endotracheal intubation.  She is then placed  in the dorsal lithotomy position, where she is prepped abdominally and  vaginally, the bladder is straight-catheterized, a Hulka tenaculum is  placed in the uterus as a manipulator and she is draped in a sterile  fashion.  The infraumbilical and suprapubic areas are injected in the  midline with 0.5% plain Marcaine.  A small infraumbilical incision is  made and a disposable Veress needle is placed on the first attempt  without difficulty.  Syringe and drop test are normal.  Two liters of  gas were insufflated under low pressure with good tympany in the right  upper quadrant.  Veress needle is removed and a 10/11 XL bladeless  disposable trocar sleeve is placed using direct visualization with the  diagnostic laparoscopic.  After placement, the operative laparoscope was  placed.  A small suprapubic incision is made in the midline and a 5-mm  XL bladeless disposable trocar sleeve is placed under direct  visualization without difficulty.  The above findings are noted.  The  area of endometriosis on the right uterosacral ligament is  cauterized  with bipolar cautery.  This is done clear of the area of the ureter.  Then using the Gyrus bipolar cautery cutting instrument, the  infundibulopelvic ligament is taken down and taken on down across the  round ligament down to the level of the vesicouterine peritoneum.  The  proximal ligaments taken down on the left down to the level of the  vesicouterine peritoneum.  Good hemostasis is maintained.  The  vesicouterine peritoneum was taken down cephalolaterally.  Instruments  are removed.  Suprapubic trocar sleeve is removed and attention is  turned to the vagina.  The posterior cul-de-sac is entered sharply.  The  cervix is circumscribed with unipolar cautery.  The Gyrus bipolar  cautery instrument is used to  take down the uterosacral ligaments  bilaterally, followed by the bladder pillars, cardinal ligaments and  uterine vessels.  The fundus is delivered posteriorly.  Pedicles are  taken down with the Gyrus and the specimen is delivered.  The  uterosacral ligaments are plicated to the vaginal cuff with separate  sutures of 0 Monocryl bilaterally.  All suture will be 0 Monocryl unless  otherwise designated.  Uterosacral ligaments are then plicated in the  midline with a third suture.  Cuff is closed with figure-of-eights.  Foley catheter is placed in the bladder and clear urine is noted.  Attention is then returned to the abdomen.  Pneumoperitoneum is  recreated and minor oozing is controlled with the peritoneal edges with  bipolar cautery.  Irrigation is carried out and all return is clear.  Suprapubic trocar sleeve is removed.  The pneumoperitoneum is reduced  and the umbilical trocar sleeve is removed.  The skin of the  infraumbilical incision is closed with interrupted 2-0 Vicryl sutures.  Dermabond is placed on both incisions.  All counts are correct.  The  patient is awakened and taken to the recovery room in stable condition.      Guy Sandifer Henderson Cloud, M.D.  Electronically Signed     JET/MEDQ  D:  08/05/2006  T:  08/05/2006  Job:  [id number]

## 2010-05-20 NOTE — Procedures (Signed)
NAME:  [name], [name]               ACCOUNT NO.:  [id number]   MEDICAL RECORD NO.:  [id number]          PATIENT TYPE:  OUT   LOCATION:  SLEEP CENTER                 FACILITY:  [facility]   PHYSICIAN:  Clinton D. Maple Hudson, MD, FCCP, FACPDATE OF BIRTH:  1967-02-14   DATE OF STUDY:  10/08/2006                            NOCTURNAL POLYSOMNOGRAM   REFERRING PHYSICIAN:   INDICATION FOR STUDY:  Hypersomnia with sleep apnea.  Height 5 feet 8  inches, weight 180 pounds.   EPWORTH SLEEPINESS SCORE:  11/24   MEDICATIONS:  Home medications listed and reviewed.   SLEEP ARCHITECTURE:  Total sleep time 322 minutes with sleep efficiency  74%.  Stage I was 4%, stage II 72%, stage III 14%.  REM 10% of total  sleep time.  Sleep latency 39 minutes.  REM latency 284 minutes.  Awake  after sleep onset 76 minutes.  Arousal index increased at 31.3 before  CPAP.  The patient took Proair inhaler, Lexapro, and nifedipine at 9:25  p.m.   RESPIRATORY DATA:  Split study protocol.  Apnea/hypopnea index (AHI,  RDI) 33 obstructive events per hour indicating moderate obstructive  sleep apnea/hypopnea syndrome before CPAP.  There were four obstructive  apneas and 64 hypopneas before CPAP.  Events were not positional.  REM  AHI 0.  CPAP was titrated to 17 CWP for control of snoring.  All  pressures 14 CWP and higher prevented obstructive events with an AHI of  0 per hour.  A small ResMed Quattro mask was used with heated  humidifier.   OXYGEN DATA:  Moderate to loud snoring before CPAP control with oxygen  desaturation to a nadir of 89%.  Mean oxygen saturation after CPAP  control held 97% on room air.   CARDIAC DATA:  Normal sinus rhythm.   MOVEMENT-PARASOMNIA:  Occasional limb jerk with sleep disturbance,  likely significant.   IMPRESSIONS-RECOMMENDATIONS:  1. Moderate obstructive sleep apnea/hypopnea syndrome, AHI 33 per hour      with nonpositional events.  Moderate to loud snoring and oxygen      desaturation  to a nadir of 89%.  2. Successful CPAP titration to 17 CWP, AHI 0 per hour.  If necessary      for comfort, pressures as low at 14 CWP      may prove sufficient for event control while tolerating some      breakthrough snoring intermittently.  A small ResMed Quattro CPAP      mask was used with heated humidifier.      Clinton D. Maple Hudson, MD, [facility], FACP  Diplomate, Biomedical engineer of Sleep Medicine  Electronically Signed     CDY/MEDQ  D:  10/17/2006 10:44:45  T:  10/17/2006 12:34:58  Job:  [id number]

## 2010-05-20 NOTE — H&P (Signed)
NAME:  [name], [name]               ACCOUNT NO.:  [id number]   MEDICAL RECORD NO.:  [id number]          PATIENT TYPE:  AMB   LOCATION:                                FACILITY:  [facility]   PHYSICIAN:  Guy Sandifer. Henderson Cloud, M.D. DATE OF BIRTH:  1965/12/12   DATE OF ADMISSION:  08/05/2006  DATE OF DISCHARGE:                              HISTORY & PHYSICAL   CHIEF COMPLAINT:  Pelvic pain and heavy menses.   HISTORY OF PRESENT ILLNESS:  This patient is a 45 year old married white  female, G1, P1, with known endometriosis, who has recurrence of severe  pain with her menses.  Menses are also becoming progressively heavier.  After discussion of the options, she is being admitted for laparoscopic-  assisted vaginal hysterectomy with right salpingo-oophorectomy.  She  will have a bilateral salpingo-oophorectomy if the left ovary appears  normal.  Potential risks and complications have been discussed with the  patient preoperatively.   PAST MEDICAL HISTORY:  1. Chronic hypertension.  2. Asthma.  3. Migraine headaches.  4. Polycystic ovarian syndrome.  5. Fibromyalgia.  6. Endometriosis.   PAST SURGICAL HISTORY:  1. Laparoscopy in 2000, 2002, and 2006.  2. Knee surgery in 1996.   MEDICATIONS:  1. Cymbalta 60 mg daily.  2. Nifedipine 90 mg ER daily.  3. Hydrochlorothiazide 25 mg daily.  4. Amitriptyline 25 mg.  5. Lunesta 3 mg nightly p.r.n.  6. Nasacort AQ.  7. Hydrocodone 5/500 p.r.n.  8. Soma 250 mg.  9. Albuterol inhaler.  10.Advair.   ALLERGIES:  ERYTHROMYCIN.   SOCIAL HISTORY:  The patient denies tobacco, alcohol, or drug abuse.   REVIEW OF SYSTEMS:  NEUROLOGIC:  Denies headache. CARDIAC:  Denies chest  pain.  PULMONARY:  Denies shortness of breath.  GI:  Denies recent  changes in bowel habits.   FAMILY HISTORY:  Thyroid dysfunction in mother, CVA paternal  grandmother, Alzheimer's disease paternal grandmother.   PHYSICAL EXAMINATION:  VITAL SIGNS: Height 5 feet 8 inches,  weight 185.4  pounds.  Blood pressure 140/96.  HEENT/NECK:  Without thyromegaly.  LUNGS:  Clear to auscultation.  HEART: Regular rate and rhythm.  BACK:  Without CVA tenderness.  BREASTS:  Without mass, retraction, discharge.  ABDOMEN:  Soft, nontender, without masses.  PELVIC:  Vulva, vagina, cervix without lesion.  Uterus is retroverted,  normal in size, mobile, nontender.  Adnexa nontender without masses.  EXTREMITIES:  Grossly within normal limits.  NEUROLOGIC:  Exam grossly within normal limits.   ASSESSMENT:  Endometriosis.   PLAN:  Laparoscopic-assisted vaginal hysterectomy with right salpingo-  oophorectomy, possible bilateral salpingo-oophorectomy.      Guy Sandifer Henderson Cloud, M.D.  Electronically Signed     JET/MEDQ  D:  08/02/2006  T:  08/02/2006  Job:  [id number]

## 2010-05-20 NOTE — Discharge Summary (Signed)
NAMESAMAN, GIDDENS               ACCOUNT NO.:  [id number]   MEDICAL RECORD NO.:  [id number]          PATIENT TYPE:  OIB   LOCATION:  9308                          FACILITY:  [facility]   PHYSICIAN:  Guy Sandifer. Henderson Cloud, M.D. DATE OF BIRTH:  June 08, 1965   DATE OF ADMISSION:  08/05/2006  DATE OF DISCHARGE:  08/06/2006                               DISCHARGE SUMMARY   DISCHARGE DIAGNOSIS:  Endometriosis.   PROCEDURE:  On 08/05/2006 laparoscopically-assisted vaginal hysterectomy  with right salpingo-oophorectomy.   REASON FOR ADMISSION:  This patient is a 45 year old married white  female G1, P1 with known endometriosis and recurrent severe pelvic pain.  Details dictated in history and physical.  She is admitted for surgical  management.   HOSPITAL COURSE:  The patient is admitted to the hospital, undergoes the  above procedure.  Estimated blood loss is 200 mL.  Upon arriving to the  floor postoperatively, she had diastolic blood pressures of 110 and 106.  She was given labetalol 10 mg intravenously twice.  Her diastolic blood  pressures then went to the 90s and the 80s.  On the evening of surgery  she had good pain relief.  No nausea, was tolerating regular diet.  Vital signs were stable.  She was afebrile with clear urine output.  On  the day of discharge she is tolerating regular diet, ambulating and  voiding, but not passing flatus yet.  Vital signs are stable.  She is  afebrile.  Hemoglobin is 13.6.  Her IV will be discontinued.  She will  be given a Dulcolax suppository as needed and will be discharged home  after passed flatus.   CONDITION ON DISCHARGE:  Good.   DIET:  Regular as tolerated.   ACTIVITY:  No lifting, no operation of automobiles, no vaginal entry.  She is to call the office for problems including but not limited to  temperature of 101 degrees, persistent nausea, vomiting, heavy bleeding  or increasing pain.   MEDICATIONS:  1. Percocet 5/325 mg #40 one to two p.o.  q.6 h p.r.n.  2. Ibuprofen 600 mg q.6 h p.r.n.  3. Multivitamin daily.   FOLLOW-UP:  Is in the office in 2 weeks.      Guy Sandifer Henderson Cloud, M.D.  Electronically Signed     JET/MEDQ  D:  08/06/2006  T:  08/06/2006  Job:  [id number]

## 2010-05-20 NOTE — Assessment & Plan Note (Signed)
[facility]                             PULMONARY OFFICE NOTE   NAME:[name], [name] ZENK                      MRN:          [id number]  DATE:10/01/2006                            DOB:          1965/09/05    PROBLEM:  A 45 year old woman self referred for evaluation of suspected  sleep apnea.   HISTORY:  Her father had been a patient of mine, successfully treated  for sleep apnea. Her husband complains of her loud snoring. Her sleep  seems fragmented with a lot of tossing and turning. She wakes exhausted  and fights the sleepiness in the day. She has not felt better using  Lunesta or Ambien. Occasionally she will wake from sleep with a vivid  dream or nightmare. She is not being told that she stops breathing,  kicks, talks, or has significant complex behaviors with sleep. She  avoids caffeine because it seemed to aggravate migraine.   MEDICATIONS:  1. Nifedipine 90 mg ER.  2. Lexapro 20 mg.  3. Hydrochlorothiazide 25 mg.  4. Nasonex.  5. Soma 250 mg.  6. Advair 250/50.  7. ProAir HFA.   Typical bedtime between 10 p.m. and 1 a.m. estimating sleep latency  anywhere from 30 minutes to a couple of hours and waking 3 to 4 times a  night before getting up a 7 a.m.   REVIEW OF SYSTEMS:  Snoring, difficultly with sleep onset, daytime  sleepiness, need for sleeping meds, non restorative sleep, indigestion,  cough when her asthma is flared, depression, bruxism.   PAST MEDICAL HISTORY:  Seasonal allergic rhinitis which I had treated  many years ago. Currently she uses Nasonex and she uses ProAir for an  asthma component. No ENT surgery. No history fish oil thyroid or heart  problems. She has hypertension, allergic rhinitis, chronic headaches,  fibromyalgia, depression, surgery for hysterectomy July 31 of this year.   SOCIAL HISTORY:  Never smoked. No alcohol. Married. She is not employed.   FAMILY HISTORY:  Allergies and rheumatism. Father our patient  with  obstructive apnea.   OBJECTIVE:  Weight 183 pounds, blood pressure 154/108, pulse 117, room  air saturation 98%, 2 to 3/4 palate spacing with clear voice. No  strider. No thyromegaly. Nasal airway not obstructed. Work or breathing  not increased.  LUNG FIELDS: Clear.  HEART SOUNDS: Regular without murmur or gallop.  NEUROLOGIC: Unremarkable to observation.   IMPRESSION:  1. Probable obstructive sleep apnea with snoring and daytime      somnolence.  2. Seasonal allergic rhinitis.   PLAN:  Spilt protocol sleep study was discussed and ordered. She will  return after that is completed for follow up.     Clinton D. Maple Hudson, MD, [facility], FACP  Electronically Signed    CDY/MedQ  DD: 10/01/2006  DT: 10/02/2006  Job #: [id number]   cc:   Marne A. Tower, MD  [facility]

## 2010-05-23 NOTE — H&P (Signed)
[facility]  Patient:    [name], [name] Visit Number: [id number] MRN: [id number]          Service Type: OBS Location: [facility] Attending Physician:  Trevor Iha Dictated by:   Juluis Mire, M.D. Admit Date:  04/20/2001 Discharge Date: 04/20/2001                           History and Physical  REASON FOR ADMISSION:         A 45 year old primigravida white female, last menstrual period of August 23, 2000 giving her an estimated date of confinement of May 30, 2001 and estimated gestational age of [redacted] weeks and three days.  This is consistent with her initial exam and early ultrasound. She is admitted at the present time to rule out preeclampsia.  HISTORY OF PRESENT ILLNESS:   Patient probably is chronic hypertensive.  Due to continued elevated blood pressures, was placed on Aldomet.  Subsequently, she described yesterday a blood pressure of 170/100.  She denied any visual changes.  She was sent to triage.  In triage the Aldomet was discontinued and she was begun on Procardia.  Evidently did have lab work done that was within normal limits as well as a reactive nonstress test.  No ultrasound was performed.  Subsequently, she was seen back in the office today.  She denied any headache, scotoma, or right upper quadrant pain.  Blood pressure, however, is 160/92 with 1+ protein.  Because of the continued blood pressure and increasing proteinuria, the patient is admitted for observation to rule out superimposed preeclampsia.  ALLERGIES:                    ERYTHROMYCIN.  MEDICATIONS:                  Procardia and prenatal vitamins.  PRENATAL LABORATORY DATA:     She is O positive, negative antibody screen. Nonreactive serology.  Negative hepatitis B surface antigen.  HISTORY:                      For past medical history, family history, and social history, please see prenatal records.  REVIEW OF SYSTEMS:             Noncontributory.  PHYSICAL EXAMINATION:  VITAL SIGNS:                  Patients blood pressure is 160/92.  Other vital signs are stable.  LUNGS:                        Clear.  CARDIOVASCULAR:               Regular rhythm and rate with a grade 2/6 systolic ejection murmur.  No clicks or gallops.  ABDOMEN:                      Confirms a gravid uterus consistent with dates. There is no right upper quadrant tenderness.  PELVIC:                       Not performed at the present time.  EXTREMITIES:                  Deep tendon reflexes are 2+, no clonus.  She has 2-3+ pitting edema.  IMPRESSION:  1. Intrauterine pregnancy at 34 weeks three                                  days.                               2. Chronic hypertension, rule out superimposed                                  preeclampsia.  PLAN:                         The patient will be brought in for observation. She will have a nonstress test.  An ultrasound will be done for full fetal assessment, specifically looking at growth and amniotic fluid volume.  She will have PIH panels.  A 24-hour urine will be obtained for total protein and creatinine clearance.  She will be maintained on the Procardia.  Ultimately, if fails to respond, may consider amniocentesis for lung maturity and subsequent induction. Dictated by:   Juluis Mire, M.D. Attending Physician:  Trevor Iha DD:  04/21/01 TD:  04/21/01 Job: [id number]

## 2010-05-23 NOTE — Op Note (Signed)
NAME:  [name], [name]               ACCOUNT NO.:  [id number]   MEDICAL RECORD NO.:  [id number]          PATIENT TYPE:  AMB   LOCATION:  [facility]C                           FACILITY:  [facility]   PHYSICIAN:  Guy Sandifer. Henderson Cloud, M.D. DATE OF BIRTH:  May 11, 1965   DATE OF PROCEDURE:  07/24/2004  DATE OF DISCHARGE:                                 OPERATIVE REPORT   PREOPERATIVE DIAGNOSIS:  Pelvic pain.   POSTOPERATIVE DIAGNOSES:  1.  Pelvic adhesions.  2.  Left ovarian cyst.   PROCEDURE:  Laparoscopy with lysis of adhesions and aspiration of left  ovarian cyst.   SURGEON:  Guy Sandifer. Henderson Cloud, M.D.   ANESTHESIA:  General with endotracheal intubation.   SPECIMENS:  Aspirate of left ovarian cyst.   ESTIMATED BLOOD LOSS:  Drops.   INDICATIONS AND CONSENT:  This patient is a 45 year old married white  female, G1, P1, with a history of endometriosis, who has recurrent painful  menses.  Details are dictated in the history and physical.  Laparoscopy has  been discussed preoperatively.  The potential risks and complications have  been reviewed preoperatively, including but not limited to infection; bowel,  bladder or ureteral damage; bleeding requiring transfusion of blood products  and possible transfusion reaction, HIV and hepatitis acquisition; DVT, PE,  pneumonia; recurrent pain.  All questions have been answered and consent is  signed on the chart.   FINDINGS:  Upper abdomen is grossly normal.  Uterus is normal in size and  contour.  Anterior cul-de-sac is normal.  Posterior cul-de-sac contains a  small pseudo-window of the peritoneum with no obvious implants of  endometriosis noted.  The right ovary is normal.  Left ovary contains a 1-2  cm smooth, translucent cyst.  Pelvic sidewalls are normal.   PROCEDURE:  The patient was taken to the operating room, where she was  identified, placed in the dorsal supine position, and general anesthesia was  induced via endotracheal intubation.  She was then  placed in the dorsal  lithotomy position, where she is prepped abdominally and vaginally, the  bladder is straight-catheterized, a Hulka is placed in the uterus as a  manipulator, and she is draped in a sterile fashion.  A small infraumbilical  incision is made and a disposable Veress needle is placed.  Syringe and drop  test are normal.  Two liters of gas are insufflated under low pressure.  There is good tympany in the right upper quadrant.  The Veress needle was  removed.  Then the disposable bladeless 10/11 trocar sleeve was placed  through the umbilical incision using a direct visualization technique with  the diagnostic laparoscope.  A small suprapubic incision is made and a 5 mm  disposable nonbladed trocar sleeve is placed under direct visualization  without difficulty.  The above findings are noted.  The left ovary is  aspirated for a few drops of serous fluid, which is sent for cytology.  Bipolar cautery is used to obtain complete hemostasis on the left ovary.  The small window formation in the posterior cul-de-sac is ablated with the  bipolar cautery.  There  are also some adhesions of the omentum to the left  pelvic brim, which are taken down sharply.  Hemostasis is obtained  completely with bipolar cautery.  Irrigation is carried out and excess fluid  is removed.  The suprapubic trocar sleeve is removed.  Pneumoperitoneum is  reduced and good hemostasis is noted all around.  The umbilical trocar  sleeve is removed.  Vicryl 2-0 suture is used to reapproximate the  subcutaneous tissues of the umbilical incision.  Both incisions are injected  with 0.5% plain Marcaine.  Dermabond is used to close both skin incisions.  The Hulka tenaculum is removed from the vagina and good hemostasis is noted  there as well.  All counts are correct.  The patient is awakened and taken  to the recovery room in stable condition.       JET/MEDQ  D:  07/24/2004  T:  07/24/2004  Job:  [id number]

## 2010-05-23 NOTE — Assessment & Plan Note (Signed)
[facility]                                   ON-CALL NOTE   NAME:Lehenbauer, GLENNA BRUNKOW                      MRN:          [id number]  DATE:09/19/2005                            DOB:          [birth year]    Called from [phone] on September 19, 2005 at 10:24 a.m. complaining of  increase in asthma symptoms.  The patient is coming in this morning with  plans to be evaluated.                                   Lelon Perla, DO   YRL/MedQ  DD:  09/19/2005  DT:  09/21/2005  Job #:  [id number]   cc:   Marne A. Milinda Antis, MD

## 2010-05-23 NOTE — H&P (Signed)
[facility]  Patient:    [name], [name]                        MRN: [id number] Adm. Date:  07/22/00 Attending:  Guy Sandifer. Arleta Creek, M.D.                         History and Physical  CHIEF COMPLAINT:              Pelvic pain and dyspareunia.  HISTORY OF PRESENT ILLNESS:   The patient is a 45 year old married white female, gravida 0, para 0, with known endometriosis, having recurrent ______ dyspareunia and dysmenorrhea.  After discussion of the options, she is being admitted for laparoscopy.  PAST MEDICAL HISTORY:         1. Chronic hypertension.                               2. Asthma.                               3. Migraine headaches.                               4. Polycystic ovarian syndrome.  PAST SURGICAL HISTORY:        1. Left knee surgery in 1996.                               2. Laparoscopy in 2000.  MEDICATIONS:                  Aldomet, Allegra p.r.n., allergy shots, Advair, Singulair, Proventil, Naprosyn.  ALLERGIES:                    ERYTHROMYCIN.  SOCIAL HISTORY:               The patient denies tobacco, alcohol, or drug abuse.  FAMILY HISTORY:               Chronic hypertension in mother.  Alzheimers disease in paternal grandmother.  REVIEW OF SYSTEMS:            Negative except as above.  PHYSICAL EXAMINATION:  VITAL SIGNS:                  Height 5 feet and 8 inches.  Weight 175-1/2 pounds.  Blood pressure 138/86.  HEENT:                        Without thyromegaly.  LUNGS:                        Clear to auscultation.  HEART:                        Regular rate and rhythm.  BACK:                         Without CVA tenderness.  BREASTS:                      Without masses, retractions, or discharge.  ABDOMEN:  Soft and nontender without masses.  PELVIC EXAMINATION:           Vulva, vagina, and cervix without lesions. Uterus normal size and tenderness along the uterosacral ligament  without nodularity.  Adnexa nontender without mass.  EXTREMITIES:                  Grossly within normal limits.  NEUROLOGICAL EXAMINATION:     Grossly within normal limits.  ASSESSMENT:                   Known endometriosis with recurrent symptoms.  PLAN:                         Laparoscopy. DD:  07/19/00 TD:  07/19/00 Job: [id number]

## 2010-05-23 NOTE — Op Note (Signed)
[facility]  Patient:    [name], [name]                      MRN: [id number] Proc. Date: 07/22/00 Adm. Date:  [id number] Attending:  Cordelia Pen Ii                           Operative Report  PREOPERATIVE DIAGNOSES:       1. Pelvic pain.                               2. Dyspareunia.  POSTOPERATIVE DIAGNOSES:      1. Endometriosis.                               2. Pelvic adhesions.  OPERATION:                    1. Laparoscopy with resection and ablation of                                  endometriosis.                               2. Lysis of adhesions.  SURGEON:                      Guy Sandifer. Arleta Creek, M.D.  ANESTHESIA:                   General endotracheal intubation.  ESTIMATED BLOOD LOSS:         Drops.  INDICATIONS AND CONSENT:      The patient is a 45 year old married white female gravida 0, para 0, with known endometriosis with recurrent pelvic pain and bumper dyspareunia.  Laparoscopy has been discussed.  The possible risks and complications have been discussed including, but not limited to infection, bowel, bladder or ureteral damage, bleeding requiring transfusion of blood products and possible transfusion reaction of HIV and hepatitis acquisition, DVT, PE, and ______.  Questions are answered and consent is signed on the chart.  FINDINGS:                     The upper abdomen appears grossly normal. Appendix is normal.  Uterus is normal in size and contour.  Anterior cul-de-sac is clean.  Left ovary contains a small follicular-type cyst.  The right ovary is normal.  Fallopian tubes are normal with normal fimbria bilaterally.  There are adhesions of the _______ of the sigmoid to the left pelvic brim.  There are multiple implants of dark black endometriosis measuring approximately 6-8 mm in diameter in the posterior cul-de-sac at the point of insertion of the right uterosacral ligament as well as on the midportion of  the left uterosacral ligament.  There is a pouch forming in the posterior cul-de-sac.  There are two small areas of white puckered tissue consistent with endometriosis on the left pelvic side wall.  DESCRIPTION OF PROCEDURE:     The patient was taken to the operating room and placed in the dorsal supine position where general anesthesia is induced via endotracheal intubation.  She was then placed in the dorsolithotomy  position where she was prepped abdominally and vaginally.  The bladder straight catheterized and the Hulka tenaculum was placed.  The uterus was manipulated and she was draped in a sterile fashion.  A small infraumbilical incision was made and a 10 mm disposable trocar sleeve was placed on the first attempt without difficulty.  No damage to surrounding structures was noted and pneumoperitoneum was induced.  A small suprapubic incision was made and a 5 mm disposable trocar sleeve was placed under direct visualization without difficulty.  The above findings were noted.  The deep black implants of endometriosis in the posterior cul-de-sac are resected sharply.  Some of these were sent for pathology.  The remainder of the implants are cauterized with bipolar cautery.  Care was taken to avoid the course of the ureters bilaterally.  Irrigation was carried out.  Adhesions to the left pelvic side wall were also taken down sharply.  Hemostasis was maintained with bipolar cautery.  Interceed is backloaded through the laparoscope and placed in the posterior cul-de-sac.  The Interceed is slightly moistened and excess fluid is removed.  The suprapubic trocar sleeve was removed, pneumoperitoneum was reduced, and no bleeding was noted from either side.  Pneumoperitoneum was completely reduced and the umbilical trocar sleeve was removed.  The skin was closed with subcuticular 3-0 Vicryl sutures.  The skin incisions were then injected with 0.5% plain Marcaine.  Dressings are applied.  The  Hulka tenaculum is removed and good hemostasis is noted.  All counts were correct.  The patient was awakened and taken to the recovery room in stable condition. DD:  07/22/00 TD:  07/22/00 Job: [id number]

## 2010-05-23 NOTE — H&P (Signed)
NAME:  [name], ADINOLFI               ACCOUNT NO.:  [id number]   MEDICAL RECORD NO.:  [id number]          PATIENT TYPE:  AMB   LOCATION:  SDC                           FACILITY:  [facility]   PHYSICIAN:  Guy Sandifer. Henderson Cloud, M.D. DATE OF BIRTH:  20-Sep-1965   DATE OF ADMISSION:  07/24/2004  DATE OF DISCHARGE:                                HISTORY & PHYSICAL   CHIEF COMPLAINT:  Dysmenorrhea.   HISTORY OF PRESENT ILLNESS:  This patient is a 45 year old married white  female, G1, P1, with known endometriosis, who has recurrent painful menses.  After discussion of the options, she is being admitted for laparoscopy.   PAST MEDICAL HISTORY:  1.  Chronic hypertension.  2.  Asthma.  3.  Migraine headache.  4.  Polycystic ovarian syndrome.  5.  Fibromyalgia.   PAST SURGICAL HISTORY:  1.  Laparoscopy in 2000.  2.  Laparoscopy in 2002.  3.  Left knee surgery in 1996.   MEDICATIONS:  1.  Nexium 40 mg daily.  2.  Nifedipine ER 90 mg daily.  3.  Albuterol inhaler p.r.n.   ALLERGIES:  ERYTHROMYCIN.   SOCIAL HISTORY:  The patient denies tobacco, alcohol, or drug abuse.   OBSTETRIC HISTORY:  Vaginal delivery x1.   REVIEW OF SYSTEMS:  NEUROLOGIC:  Denies headache.  CARDIOVASCULAR:  Denies  chest pain.  PULMONARY:  Denies shortness of breath.  GI:  Denies recent  changes in bowel habits.   PHYSICAL EXAMINATION:  VITAL SIGNS:  Height 5 feet, 8 inches.  Weight 173  pounds.  HEENT:  Without thyromegaly.  LUNGS:  Clear to auscultation.  HEART:  Regular rate and rhythm.  BACK:  Without CVA tenderness.  BREASTS:  Not examined.  ABDOMEN:  Soft, nontender, without masses.  PELVIC:  Vulva, vagina, and cervix without lesions.  Uterus is retroverted,  normal size, mobile.  Adnexa mildly tender bilaterally without palpable  masses.  EXTREMITIES:  Grossly within normal limits.  NEUROLOGIC:  Grossly within normal limits.   ASSESSMENT:  Dysmenorrhea.   PLAN:  Laparoscopy.       JET/MEDQ  D:   07/21/2004  T:  07/21/2004  Job:  [id number]

## 2010-05-23 NOTE — Assessment & Plan Note (Signed)
[name]                                 ON-CALL NOTE   NAME:[name], [name]                      MRN:          [id number]  DATE:02/28/2006                            DOB:          [birth year]    Phone number [phone].  Patient of Dr. Milinda Antis.  Date of birth June 19, 1965.  Phone call at 10:36 a.m. on February 28, 2006.   [name] was seen on Thursday, but that would be in our [facility]  Was diagnosed with a viral infection it sounds like, but she  thought she had the flu.  She has had a fever as high as 103.2, but that  is better.  Not really having shortness of breath, but is still having  nasal congestion.  The cough has been bothering her because it has  persisted, and she wonders whether she needs to be rechecked.  She has  just tried some cough drops for the cough.   PLAN:  I told her that the cough goes along with the flu, which it  sounds like she has, and that was not a reason for emergency evaluation  today.  I did suggest she try some honey, like in a cup of tea, and  dextromethorphan over-the-counter for symptom relief from the cough, and  if she was having trouble breathing, then it would be appropriate to  seek treatment today.     Karie Schwalbe, MD  Electronically Signed    RIL/MedQ  DD: 02/28/2006  DT: 02/28/2006  Job #: [id number]   cc:   Marne A. Milinda Antis, MD

## 2010-06-03 ENCOUNTER — Ambulatory Visit (INDEPENDENT_AMBULATORY_CARE_PROVIDER_SITE_OTHER): Payer: BC Managed Care – PPO | Admitting: Family Medicine

## 2010-06-03 ENCOUNTER — Encounter: Payer: Self-pay | Admitting: Family Medicine

## 2010-06-03 DIAGNOSIS — F329 Major depressive disorder, single episode, unspecified: Secondary | ICD-10-CM

## 2010-06-03 MED ORDER — BUPROPION HCL ER (XL) 150 MG PO TB24: 150.0000 mg | ORAL_TABLET | ORAL | Status: DC

## 2010-06-03 NOTE — Patient Instructions (Signed)
Try wellbutrin xl 150 This should help depressive symptoms as well as concentration problems  Continue counseling Talk to your counselor about organizational issues  Exercise helps too -keep riding bike  Follow up with me 4-6 weeks  If any side effects or feel worse - call and stop med

## 2010-06-03 NOTE — Assessment & Plan Note (Addendum)
With decrease in concentration and emotional upset over separation  Think memory issues may be linked to fibro fog and above - cannot also r/o some long term ADD Trial of wellbutrin xl- disc poss side eff F/u 4-6 wk If not imp consid psychiatry consult for depression and poss ADD testing Urged to continue counseling Disc stressors and coping methods in detail along with opt for tx and poss side eff >25 min spent with face to face with patient counseling and coordinating care

## 2010-06-03 NOTE — Progress Notes (Signed)
Subjective:    Patient ID: [name], female    DOB: 1965/08/18, 45 y.o.   MRN: [id number]  HPI Is having problems with memory  Does have fibromyalgia --? Fibro fog  Has questioned ADD Has questioned depression   Feels like her attention problems have been lifelong  In school would study for weeks and still do poorly on a test  Gets easily distracted and does not finish tasks  Trouble concentrating in general  Looses train of thought and forgets names  Is not working outside [employer] is a wreck- hard to keep up with     Husband is leaving her  In past her medicines have decreased her libido  Denies cheating  Has been in counseling since December by herself- he will not go  She is going to [employer] in [place]  Has psychiatrist in their office   She has been on several meds in the past   Takes tramadol occas- not often at all   Took cymbalta in past  Also lexapro   Past Medical History  Diagnosis Date  . Allergy     allergic rhinitis  . Asthma   . Depression   . GERD (gastroesophageal reflux disease)   . Hypertension   . Fibromyalgia   . Degenerative disc disease   . Sleep apnea   . Fx of fibula 2009  . IBS (irritable bowel syndrome)   . Chronic headaches   . Hemorrhoids     History   Social History  . Marital Status: Married    Spouse Name: N/A    Number of Children: 1  . Years of Education: N/A   Occupational History  . Disabled due to fibromyalgia    Social History Main Topics  . Smoking status: Never Smoker   . Smokeless tobacco: Never Used  . Alcohol Use: No  . Drug Use: No  . Sexually Active: Not on file   Other Topics Concern  . Not on file   Social History Narrative   1 caffeine drink daily     Family History  Problem Relation Age of Onset  . Hypertension Mother   . Thyroid disease Mother   . Fibromyalgia Mother   . Diabetes Father   . Kidney cancer Maternal Grandmother   . Colon cancer Neg Hx   . Colon polyps  Mother          Review of Systems Review of Systems  Constitutional: Negative for fever, appetite change,  and unexpected weight change. pos for fatigue Eyes: Negative for pain and visual disturbance.  Respiratory: Negative for cough and shortness of breath.   Cardiovascular: Negative.for cp or palpitations or edema    Gastrointestinal: Negative for nausea, diarrhea and constipation.  Genitourinary: Negative for urgency and frequency.  Skin: Negative for pallor. Or rash MSK pos for joint and muscle pain / no swollen joints  Neurological: Negative for weakness, light-headedness, numbness and headaches.  Hematological: Negative for adenopathy. Does not bruise/bleed easily.  Psychiatric/Behavioral: pos for dysphoric mood/ depression, neg for anx or SI.          Objective:   Physical Exam  Constitutional: She is oriented to person, place, and time. She appears well-developed and well-nourished. No distress.       overwt and well appearing  occ tearful  HENT:  Head: Normocephalic and atraumatic.  Mouth/Throat: Oropharynx is clear and moist.  Eyes: Conjunctivae and EOM are normal. Pupils are equal, round, and reactive to light.  Neck: Normal range of motion. Neck supple. No JVD present. No thyromegaly present.  Cardiovascular: Normal rate, regular rhythm, normal heart sounds and intact distal pulses.   Pulmonary/Chest: Effort normal and breath sounds normal. No respiratory distress. She has no wheezes.  Abdominal: Soft. Bowel sounds are normal.  Musculoskeletal:       Pos fibromyalgia trigger points   Lymphadenopathy:    She has no cervical adenopathy.  Neurological: She is alert and oriented to person, place, and time. She has normal reflexes. No cranial nerve deficit or sensory deficit. Coordination and gait normal.       No tremor  Skin: Skin is warm and dry. No rash noted. No erythema. No pallor.  Psychiatric:       Is generally down and tearful when disc her marriage    However has generally good insight and eye contact and communication skills           Assessment & Plan:

## 2010-06-11 ENCOUNTER — Telehealth: Payer: Self-pay | Admitting: *Deleted

## 2010-06-11 NOTE — Telephone Encounter (Signed)
Pt has been taking wellbutrin for about a week and is having problems with insomnia.  She is asking if you have any recommendations on what to do until this goes away.

## 2010-06-11 NOTE — Telephone Encounter (Signed)
I would try benadryl 25-50 mg about 1 hour before bed  Let me know if this does not help

## 2010-06-11 NOTE — Telephone Encounter (Signed)
Patient notified as instructed by telephone. 

## 2010-07-11 ENCOUNTER — Ambulatory Visit (INDEPENDENT_AMBULATORY_CARE_PROVIDER_SITE_OTHER): Payer: BC Managed Care – PPO | Admitting: Family Medicine

## 2010-07-11 ENCOUNTER — Encounter: Payer: Self-pay | Admitting: Family Medicine

## 2010-07-11 DIAGNOSIS — J069 Acute upper respiratory infection, unspecified: Secondary | ICD-10-CM | POA: Insufficient documentation

## 2010-07-11 DIAGNOSIS — F329 Major depressive disorder, single episode, unspecified: Secondary | ICD-10-CM

## 2010-07-11 MED ORDER — BUPROPION HCL ER (XL) 300 MG PO TB24: 300.0000 mg | ORAL_TABLET | ORAL | Status: DC

## 2010-07-11 NOTE — Progress Notes (Signed)
Subjective:    Patient ID: [name], female    DOB: 1965/05/14, 45 y.o.   MRN: [id number]  HPI Here for f/u of depression/ concentration issues and also c/o post nasal drip Was good for a couple of weeks and leveled off  Not as gung ho as she was -- but overall doing better  Concentration is still poor  Is ok with inc dose   Stress level is through the roof  husb left over a month ago  A little bit of a relief Has not started legal proceedings   Also has a summer cold Started Thursday - post nasal drip/ raw throat Little body ache -- no fever  Some cough- dry Nose ok No ha  Patient Active Problem List  Diagnoses  . DEPRESSION  . OBSTRUCTIVE SLEEP APNEA  . HYPERTENSION  . ALLERGIC RHINITIS  . ASTHMA  . GERD  . IBS  . ENDOMETRIOSIS  . FEMALE INFERTILITY  . DYSHIDROTIC ECZEMA, HANDS  . BACK PAIN  . FIBROMYALGIA  . INSOMNIA  . Viral URI   Past Medical History  Diagnosis Date  . Allergy     allergic rhinitis  . Asthma   . Depression   . GERD (gastroesophageal reflux disease)   . Hypertension   . Fibromyalgia   . Degenerative disc disease   . Sleep apnea   . Fx of fibula 2009  . IBS (irritable bowel syndrome)   . Chronic headaches   . Hemorrhoids    Past Surgical History  Procedure Date  . Laparoscopy 08/2004    endometriosis - several   . Breast surgery     left nipple removed - benign tumor  . Anterior cruciate ligament repair 1996    small tear of ACL  . Bunionectomy   . Tonsillectomy   . Abdominal hysterectomy    History  Substance Use Topics  . Smoking status: Never Smoker   . Smokeless tobacco: Never Used  . Alcohol Use: No   Family History  Problem Relation Age of Onset  . Hypertension Mother   . Thyroid disease Mother   . Fibromyalgia Mother   . Diabetes Father   . Kidney cancer Maternal Grandmother   . Colon cancer Neg Hx   . Colon polyps Mother    Allergies  Allergen Reactions  . Cetirizine Hcl     REACTION: not  effective  . Erythromycin     REACTION: stomach hurts  . Escitalopram Oxalate     REACTION: no improvement  . Lisinopril     REACTION: cough  . Loratadine     REACTION: not effective  . Nifedipine     REACTION: fatigued and tremor  . Sertraline Hcl     REACTION: no help   Current Outpatient Prescriptions on File Prior to Visit  Medication Sig Dispense Refill  . acetaminophen (TYLENOL) 500 MG tablet OTC as directed.       Marland Kitchen albuterol (PROAIR HFA) 108 (90 BASE) MCG/ACT inhaler Inhale 2 puffs into the lungs every 4 (four) hours as needed for wheezing. for wheezing.  3 Inhaler  3  . Azelastine HCl (ASTEPRO) 0.15 % SOLN 1-2 sprays in each nostril two times a day as needed.       . diphenhydrAMINE (BENADRYL) 25 mg capsule OTC as directed.       . fexofenadine-pseudoephedrine (ALLEGRA-D 24) 180-240 MG per 24 hr tablet OTC as directed.       . Fluticasone-Salmeterol (ADVAIR DISKUS) 250-50 MCG/DOSE AEPB Rinse  mouth. Use 1 puff, twice daily  60 each  prn  . methocarbamol (ROBAXIN) 500 MG tablet Take 500 mg by mouth. One at bedtime       . mometasone (ELOCON) 0.1 % cream Apply to affected areas on hands once daily as needed.       . mometasone (NASONEX) 50 MCG/ACT nasal spray 1-2 puffs each nostril once daily.       . montelukast (SINGULAIR) 10 MG tablet Take 1 tablet (10 mg total) by mouth at bedtime. 1 by mouth once daily for asthma and allergy symptoms.  90 tablet  3  . olmesartan (BENICAR) 40 MG tablet Take 40 mg by mouth daily.        . traMADol (ULTRAM) 50 MG tablet Take 1 tablet (50 mg total) by mouth 3 (three) times daily as needed for pain (watch for sedation ).  90 tablet  2      Review of Systems Review of Systems  Constitutional: Negative for fever, appetite change, and unexpected weight change. pos for fatigue  Eyes: Negative for pain and visual disturbance.  Respiratory: pos for cough and harsh / hoarse voice ENT pos for post nasal drip/ no ear pain Cardiovascular: Negative.  For cp or edema   Gastrointestinal: Negative for nausea, diarrhea and constipation.  Genitourinary: Negative for urgency and frequency.  Skin: Negative for pallor.  Neurological: Negative for weakness, light-headedness, numbness and headaches.  Hematological: Negative for adenopathy. Does not bruise/bleed easily.  Psychiatric/Behavioral: Negative for dysphoric mood. The patient is not nervous/anxious.          Objective:   Physical Exam  Constitutional: She appears well-developed and well-nourished. No distress.       overwt and well appearing   HENT:  Head: Normocephalic and atraumatic.  Right Ear: External ear normal.  Left Ear: External ear normal.  Nose: Nose normal.  Mouth/Throat: Oropharynx is clear and moist.       No sinus tenderness  Eyes: Conjunctivae and EOM are normal. Pupils are equal, round, and reactive to light.  Neck: Normal range of motion. Neck supple. No JVD present. No thyromegaly present.  Cardiovascular: Normal rate, regular rhythm and normal heart sounds.   Pulmonary/Chest: Effort normal and breath sounds normal. No respiratory distress. She has no wheezes.       Harsh bs  Hoarse voice No rales or rhonchi  Abdominal: Soft. Bowel sounds are normal.  Musculoskeletal: She exhibits no edema.  Lymphadenopathy:    She has no cervical adenopathy.  Neurological: She is alert. She has normal reflexes. Coordination normal.  Skin: Skin is warm and dry. No rash noted. No erythema. No pallor.  Psychiatric:       Overall improved affect  is brighter  Smiles occasionally  Talks about stress well  Nl eye contact and comm skills          Assessment & Plan:

## 2010-07-11 NOTE — Patient Instructions (Signed)
For cold - drink lots of liquids and get rest  Zinc losenges can help  If worse or fever - let me know  Increase wellbutrin to 300 xl- update if side effects or problems Follow up in 6-8 weeks

## 2010-07-11 NOTE — Assessment & Plan Note (Signed)
Some improvement in motivation and mood - but stress is still very high Plan to continue counseling  Inc wellbutrin xl to 300 mg - update if side eff F/u 4-6 weeks

## 2010-07-11 NOTE — Assessment & Plan Note (Signed)
Viral uri- mild/ nl exam  Recommend benadryl prn/ zinc lozenges/ fluids/ rest Update if worse or not imp in 10 day

## 2010-08-20 ENCOUNTER — Encounter: Payer: Self-pay | Admitting: Family Medicine

## 2010-08-20 ENCOUNTER — Ambulatory Visit (INDEPENDENT_AMBULATORY_CARE_PROVIDER_SITE_OTHER): Payer: BC Managed Care – PPO | Admitting: Family Medicine

## 2010-08-20 DIAGNOSIS — F988 Other specified behavioral and emotional disorders with onset usually occurring in childhood and adolescence: Secondary | ICD-10-CM | POA: Insufficient documentation

## 2010-08-20 DIAGNOSIS — R41840 Attention and concentration deficit: Secondary | ICD-10-CM

## 2010-08-20 DIAGNOSIS — F329 Major depressive disorder, single episode, unspecified: Secondary | ICD-10-CM

## 2010-08-20 NOTE — Patient Instructions (Signed)
Stay on the wellbutrin xl 300 mg since it is helping mood  Go ahead and get tested for ADD  Let me know results when they come back  Try to stay active and get exercise

## 2010-08-20 NOTE — Progress Notes (Signed)
Subjective:    Patient ID: [name], female    DOB: 03/05/1965, 45 y.o.   MRN: [id number]  HPI Here for f/u of depression/ fibromyalgia "fog" and ADD  Last visit had started wellbutrin xl 150 with some improvement Then inc dose to 300  Wt is down 8 lb- was trying to loose (some from stress)  Happy with that  Appetite is good  Has moved and that is a huge stress reliever - but needs to eat less   For the most part it has helped her mood  More cheerful/ less down in general  Is able to deal with the stress better Less tearful   Does not help the fibro fog or physical symptoms    Her therapist is doing paperwork for formal testing for ADD  Her daughter is ADHD -- is becoming problem in school   She does have some hot flashes occ ? If menopause because she had a hysterectomy  Patient Active Problem List  Diagnoses  . DEPRESSION  . OBSTRUCTIVE SLEEP APNEA  . HYPERTENSION  . ALLERGIC RHINITIS  . ASTHMA  . GERD  . IBS  . ENDOMETRIOSIS  . FEMALE INFERTILITY  . DYSHIDROTIC ECZEMA, HANDS  . BACK PAIN  . FIBROMYALGIA  . INSOMNIA  . Poor concentration   Past Medical History  Diagnosis Date  . Allergy     allergic rhinitis  . Asthma   . Depression   . GERD (gastroesophageal reflux disease)   . Hypertension   . Fibromyalgia   . Degenerative disc disease   . Sleep apnea   . Fx of fibula 2009  . IBS (irritable bowel syndrome)   . Chronic headaches   . Hemorrhoids    Past Surgical History  Procedure Date  . Laparoscopy 08/2004    endometriosis - several   . Breast surgery     left nipple removed - benign tumor  . Anterior cruciate ligament repair 1996    small tear of ACL  . Bunionectomy   . Tonsillectomy   . Abdominal hysterectomy    History  Substance Use Topics  . Smoking status: Never Smoker   . Smokeless tobacco: Never Used  . Alcohol Use: No   Family History  Problem Relation Age of Onset  . Hypertension Mother   . Thyroid disease Mother    . Fibromyalgia Mother   . Diabetes Father   . Kidney cancer Maternal Grandmother   . Colon cancer Neg Hx   . Colon polyps Mother    Allergies  Allergen Reactions  . Cetirizine Hcl     REACTION: not effective  . Erythromycin     REACTION: stomach hurts  . Escitalopram Oxalate     REACTION: no improvement  . Lisinopril     REACTION: cough  . Loratadine     REACTION: not effective  . Nifedipine     REACTION: fatigued and tremor  . Sertraline Hcl     REACTION: no help   Current Outpatient Prescriptions on File Prior to Visit  Medication Sig Dispense Refill  . acetaminophen (TYLENOL) 500 MG tablet OTC as directed.       Marland Kitchen albuterol (PROAIR HFA) 108 (90 BASE) MCG/ACT inhaler Inhale 2 puffs into the lungs every 4 (four) hours as needed for wheezing. for wheezing.  3 Inhaler  3  . buPROPion (WELLBUTRIN XL) 300 MG 24 hr tablet Take 1 tablet (300 mg total) by mouth every morning.  30 tablet  11  .  diphenhydrAMINE (BENADRYL) 25 mg capsule OTC as directed.       . Fluticasone-Salmeterol (ADVAIR DISKUS) 250-50 MCG/DOSE AEPB Rinse mouth. Use 1 puff, twice daily  60 each  prn  . methocarbamol (ROBAXIN) 500 MG tablet Take 500 mg by mouth. One at bedtime       . montelukast (SINGULAIR) 10 MG tablet Take 1 tablet (10 mg total) by mouth at bedtime. 1 by mouth once daily for asthma and allergy symptoms.  90 tablet  3  . olmesartan (BENICAR) 40 MG tablet Take 40 mg by mouth daily.        . traMADol (ULTRAM) 50 MG tablet Take 1 tablet (50 mg total) by mouth 3 (three) times daily as needed for pain (watch for sedation ).  90 tablet  2  . Azelastine HCl (ASTEPRO) 0.15 % SOLN 1-2 sprays in each nostril two times a day as needed.       . fexofenadine-pseudoephedrine (ALLEGRA-D 24) 180-240 MG per 24 hr tablet OTC as directed.       . mometasone (ELOCON) 0.1 % cream Apply to affected areas on hands once daily as needed.       . mometasone (NASONEX) 50 MCG/ACT nasal spray 1-2 puffs each nostril once daily.            Review of Systems Review of Systems  Constitutional: Negative for fever, appetite change,  and unexpected weight change. pos for fatigue which is improved Eyes: Negative for pain and visual disturbance.  Respiratory: Negative for cough and shortness of breath.   Cardiovascular: Negative.  for cp or palpitations  Gastrointestinal: Negative for nausea, diarrhea and constipation.  Genitourinary: Negative for urgency and frequency.  Skin: Negative for pallor. or rash MSK pos for chronic myofascial pain from fibromyalgia  Neurological: Negative for weakness, light-headedness, numbness and headaches. no tremor  Hematological: Negative for adenopathy. Does not bruise/bleed easily.  Psychiatric/Behavioral:pos for depression that has improved         Objective:   Physical Exam  Constitutional: She appears well-developed and well-nourished. No distress.          HENT:  Head: Normocephalic and atraumatic.  Eyes: Conjunctivae and EOM are normal.  Neck: Normal range of motion. Neck supple. No JVD present. Carotid bruit is not present. No thyromegaly present.  Cardiovascular: Normal rate, regular rhythm and normal heart sounds.   Pulmonary/Chest: Effort normal and breath sounds normal. No respiratory distress. She has no wheezes.  Musculoskeletal: She exhibits no edema.  Lymphadenopathy:    She has no cervical adenopathy.  Neurological: She is alert. She has normal reflexes. No cranial nerve deficit. Coordination normal.       No tremor   Skin: Skin is warm and dry. No rash noted. No erythema. No pallor.  Psychiatric: She has a normal mood and affect.       Talkative and cheerful  Good eye contact and comm skills           Assessment & Plan:

## 2010-08-20 NOTE — Assessment & Plan Note (Signed)
This has improved with wellbutrin and stress has settled down  Disc symptoms -less sad and tearful Some more energy Not helping concentration , however  Will continue this

## 2010-08-20 NOTE — Assessment & Plan Note (Signed)
Suspect ADD which runs in family  She will be tested by her counselor Sander Radon in [place] - and if pos for ADD will see psychiatrist there to disc med options Unfortunately - wellbutrin is not helping this

## 2010-09-05 ENCOUNTER — Ambulatory Visit (INDEPENDENT_AMBULATORY_CARE_PROVIDER_SITE_OTHER): Payer: BC Managed Care – PPO | Admitting: Family Medicine

## 2010-09-05 ENCOUNTER — Encounter: Payer: Self-pay | Admitting: Family Medicine

## 2010-09-05 ENCOUNTER — Telehealth: Payer: Self-pay

## 2010-09-05 VITALS — BP 124/82 | HR 84 | Temp 99.3°F | Ht 66.5 in | Wt 182.8 lb

## 2010-09-05 DIAGNOSIS — F988 Other specified behavioral and emotional disorders with onset usually occurring in childhood and adolescence: Secondary | ICD-10-CM

## 2010-09-05 MED ORDER — AMPHETAMINE-DEXTROAMPHET ER 20 MG PO CP24: 20.0000 mg | ORAL_CAPSULE | ORAL | Status: DC

## 2010-09-05 MED ORDER — AMPHETAMINE-DEXTROAMPHETAMINE 10 MG PO TABS: 10.0000 mg | ORAL_TABLET | Freq: Every day | ORAL | Status: DC

## 2010-09-05 NOTE — Progress Notes (Signed)
Subjective:    Patient ID: [name], female    DOB: May 21, 1965, 45 y.o.   MRN: [id number]  HPI Is having quite a bit of hair loss - handfuls at a time Looked up wellbutrin - and hair loss is not a major side eff  Mother has thin hair in general   Got note from counselor  Has ADD  wellbutrin has not helped the ADD - but is helping mood  Really wants to try a stimulant   Is open to stimulant   Does not relax until 8 pm and has to do homework with child at 4 pm   Cannot generally concentrate of focus on anything  ? If it is also part of her fibro fog? Difficulty finishing her tasks Affects her daily life  Patient Active Problem List  Diagnoses  . DEPRESSION  . OBSTRUCTIVE SLEEP APNEA  . HYPERTENSION  . ALLERGIC RHINITIS  . ASTHMA  . GERD  . IBS  . ENDOMETRIOSIS  . FEMALE INFERTILITY  . DYSHIDROTIC ECZEMA, HANDS  . BACK PAIN  . FIBROMYALGIA  . INSOMNIA  . ADD (attention deficit disorder)   Past Medical History  Diagnosis Date  . Allergy     allergic rhinitis  . Asthma   . Depression   . GERD (gastroesophageal reflux disease)   . Hypertension   . Fibromyalgia   . Degenerative disc disease   . Sleep apnea   . Fx of fibula 2009  . IBS (irritable bowel syndrome)   . Chronic headaches   . Hemorrhoids    Past Surgical History  Procedure Date  . Laparoscopy 08/2004    endometriosis - several   . Breast surgery     left nipple removed - benign tumor  . Anterior cruciate ligament repair 1996    small tear of ACL  . Bunionectomy   . Tonsillectomy   . Abdominal hysterectomy    History  Substance Use Topics  . Smoking status: Never Smoker   . Smokeless tobacco: Never Used  . Alcohol Use: No   Family History  Problem Relation Age of Onset  . Hypertension Mother   . Thyroid disease Mother   . Fibromyalgia Mother   . Diabetes Father   . Kidney cancer Maternal Grandmother   . Colon cancer Neg Hx   . Colon polyps Mother    Allergies  Allergen  Reactions  . Cetirizine Hcl     REACTION: not effective  . Erythromycin     REACTION: stomach hurts  . Escitalopram Oxalate     REACTION: no improvement  . Lisinopril     REACTION: cough  . Loratadine     REACTION: not effective  . Nifedipine     REACTION: fatigued and tremor  . Sertraline Hcl     REACTION: no help   Current Outpatient Prescriptions on File Prior to Visit  Medication Sig Dispense Refill  . acetaminophen (TYLENOL) 500 MG tablet OTC as directed.       Marland Kitchen albuterol (PROAIR HFA) 108 (90 BASE) MCG/ACT inhaler Inhale 2 puffs into the lungs every 4 (four) hours as needed for wheezing. for wheezing.  3 Inhaler  3  . Azelastine HCl (ASTEPRO) 0.15 % SOLN 1-2 sprays in each nostril two times a day as needed.       Marland Kitchen buPROPion (WELLBUTRIN XL) 300 MG 24 hr tablet Take 1 tablet (300 mg total) by mouth every morning.  30 tablet  11  . diphenhydrAMINE (BENADRYL) 25 mg  capsule OTC as directed.       . fexofenadine-pseudoephedrine (ALLEGRA-D 24) 180-240 MG per 24 hr tablet OTC as directed.       . Fluticasone-Salmeterol (ADVAIR DISKUS) 250-50 MCG/DOSE AEPB Rinse mouth. Use 1 puff, twice daily  60 each  prn  . methocarbamol (ROBAXIN) 500 MG tablet Take 500 mg by mouth. One at bedtime       . mometasone (NASONEX) 50 MCG/ACT nasal spray 1-2 puffs each nostril once daily.       . montelukast (SINGULAIR) 10 MG tablet Take 1 tablet (10 mg total) by mouth at bedtime. 1 by mouth once daily for asthma and allergy symptoms.  90 tablet  3  . olmesartan (BENICAR) 40 MG tablet Take 40 mg by mouth daily.        . Olopatadine HCl (PATADAY) 0.2 % SOLN Place 1 drop into both eyes at bedtime.        . traMADol (ULTRAM) 50 MG tablet Take 1 tablet (50 mg total) by mouth 3 (three) times daily as needed for pain (watch for sedation ).  90 tablet  2  . mometasone (ELOCON) 0.1 % cream Apply to affected areas on hands once daily as needed.              Review of Systems ROS Review of Systems    Constitutional: Negative for fever, appetite change, fatigue and unexpected weight change.  Eyes: Negative for pain and visual disturbance.  Respiratory: Negative for cough and shortness of breath.   Cardiovascular: Negative for cp or palpitations    Gastrointestinal: Negative for nausea, diarrhea and constipation.  Genitourinary: Negative for urgency and frequency.  Skin: Negative for pallor or rash  pos for hair loss Neurological: Negative for weakness, light-headedness, numbness and headaches.  Hematological: Negative for adenopathy. Does not bruise/bleed easily.  Psychiatric/Behavioral: Negative for dysphoric mood. No anx/ pos for decreased concentration          Objective:   Physical Exam  Constitutional: She is oriented to person, place, and time. She appears well-developed and well-nourished. No distress.  HENT:  Head: Normocephalic and atraumatic.  Nose: Nose normal.  Mouth/Throat: Oropharynx is clear and moist.       No focal allopecia seen   Eyes: Conjunctivae and EOM are normal. Pupils are equal, round, and reactive to light.  Neck: Normal range of motion. Neck supple. No thyromegaly present.  Cardiovascular: Normal rate, regular rhythm, normal heart sounds and intact distal pulses.   Pulmonary/Chest: Effort normal and breath sounds normal. No respiratory distress. She has no wheezes.  Musculoskeletal: She exhibits tenderness. She exhibits no edema.  Lymphadenopathy:    She has no cervical adenopathy.  Neurological: She is alert and oriented to person, place, and time. She has normal reflexes. No cranial nerve deficit. Coordination normal.       No tremor   Skin: Skin is warm and dry. No rash noted. No erythema. No pallor.  Psychiatric: She has a normal mood and affect.          Assessment & Plan:

## 2010-09-05 NOTE — Assessment & Plan Note (Signed)
Newly tested for and dx by her psychologist  No imp on wellbutrin  Will start with adderall xr 20 in am and 10 mg short acting t 4 pm Disc poss side eff in great detail Will keep me updated re: sleep F/u 2 mo

## 2010-09-05 NOTE — Patient Instructions (Signed)
Try the adderall xr 20 mg once daily - take in am when you get up - before 8  Then take 10 mg dose at 4 pm  Follow up with me in 2 months  If before then you think we need to change dose or if you have side effects please call

## 2010-09-05 NOTE — Telephone Encounter (Signed)
Prior authorization requested for Amphetamine Salts 10mg  tabs and amphetamine salts ER 20mg  cap. Sherry at [phone] will fax prior auth form. Will hold [employer] sheet at my desk. until gets Prior [place].

## 2010-09-09 ENCOUNTER — Telehealth: Payer: Self-pay | Admitting: *Deleted

## 2010-09-09 NOTE — Telephone Encounter (Signed)
Prior [employer] form is on your shelf.

## 2010-09-09 NOTE — Telephone Encounter (Signed)
Chart opened in error

## 2010-09-10 NOTE — Telephone Encounter (Signed)
Completed form faxed to [phone] as instructed. Form given to [place]. Form also sent to be scanned.

## 2010-09-10 NOTE — Telephone Encounter (Signed)
Prior auth given for adderall, both strengths.  Advised pharmacy.  Approval letter placed on doctor's desk for signature and scanning.

## 2010-09-10 NOTE — Telephone Encounter (Signed)
Done and in IN box 

## 2010-09-17 ENCOUNTER — Telehealth: Payer: Self-pay | Admitting: Family Medicine

## 2010-09-17 NOTE — Telephone Encounter (Signed)
Pt said she was asked to call back if Aderol wasn't helping or felt like she needed stronger dose.  Pt questioning if one week is long enough to be in system and to have seen results.  Please call pt regarding questions at [phone].

## 2010-09-17 NOTE — Telephone Encounter (Signed)
Left vm for pt to callback 

## 2010-09-18 NOTE — Telephone Encounter (Signed)
Patient notified as instructed by telephone. 

## 2010-09-18 NOTE — Telephone Encounter (Signed)
I told pt usually can see some results in 1 week or so but not the maximum effect. Pt said she can see slight improvement in forgetfulness. Advised to continue present dosage of Adderal XR 20 mg taking one cap in AM and Adderal 10 mg taking 1 tab in afternoon. Pt wants to know how long she should wait before she sees maximum benefit from med or does Dr Milinda Antis think needs to increase dosage now? Pt can be reached at [phone].Please advise. Pt understands Dr Milinda Antis is out of office today.

## 2010-09-18 NOTE — Telephone Encounter (Signed)
Give it another week - and then update me again - if no further improvement will titrate

## 2010-10-06 ENCOUNTER — Other Ambulatory Visit: Payer: Self-pay | Admitting: *Deleted

## 2010-10-06 MED ORDER — AMPHETAMINE-DEXTROAMPHETAMINE 20 MG PO TABS: ORAL_TABLET | ORAL | Status: DC

## 2010-10-06 NOTE — Telephone Encounter (Signed)
Advised pt, script placed up front for pick up.

## 2010-10-06 NOTE — Telephone Encounter (Signed)
Pt needs refill on adderall, but she thinks she may need to increase the dose.  It's helping, but she says an increased dose would work better.  Please call when ready.

## 2010-10-06 NOTE — Telephone Encounter (Signed)
Will try the 20 mg instead- let me know how it goes or if any side eff or problems

## 2010-10-13 ENCOUNTER — Encounter: Payer: Self-pay | Admitting: Family Medicine

## 2010-10-13 ENCOUNTER — Ambulatory Visit (INDEPENDENT_AMBULATORY_CARE_PROVIDER_SITE_OTHER): Payer: BC Managed Care – PPO | Admitting: Family Medicine

## 2010-10-13 VITALS — BP 136/94 | HR 87 | Temp 98.6°F | Ht 66.5 in | Wt 181.5 lb

## 2010-10-13 DIAGNOSIS — J069 Acute upper respiratory infection, unspecified: Secondary | ICD-10-CM

## 2010-10-13 MED ORDER — CHLORPHENIRAMINE-HYDROCODONE 8-10 MG/5ML PO LQCR: 5.0000 mL | Freq: Two times a day (BID) | ORAL | Status: DC | PRN

## 2010-10-13 MED ORDER — AMOXICILLIN-POT CLAVULANATE 875-125 MG PO TABS: 1.0000 | ORAL_TABLET | Freq: Two times a day (BID) | ORAL | Status: AC

## 2010-10-13 NOTE — Patient Instructions (Signed)
Take antibiotic as directed if symptoms do not improve over next several days.  Drink lots of fluids.  Treat sympotmatically with Mucinex, nasal saline irrigation, and Tylenol/Ibuprofen. Cough suppressant at night. Call if not improving as expected in 5-7 days.

## 2010-10-13 NOTE — Progress Notes (Signed)
SUBJECTIVE:  [name] is a 45 y.o. female who complains of coryza, congestion, sore throat, post nasal drip, productive cough, myalgias, headache and bilateral sinus pain for 11 days. She denies a history of anorexia, chest pain, chills and dizziness and does a history of asthma.    Patient Active Problem List  Diagnoses  . DEPRESSION  . OBSTRUCTIVE SLEEP APNEA  . HYPERTENSION  . ALLERGIC RHINITIS  . ASTHMA  . GERD  . IBS  . ENDOMETRIOSIS  . FEMALE INFERTILITY  . DYSHIDROTIC ECZEMA, HANDS  . BACK PAIN  . FIBROMYALGIA  . INSOMNIA  . ADD (attention deficit disorder)   Past Medical History  Diagnosis Date  . Allergy     allergic rhinitis  . Asthma   . Depression   . GERD (gastroesophageal reflux disease)   . Hypertension   . Fibromyalgia   . Degenerative disc disease   . Sleep apnea   . Fx of fibula 2009  . IBS (irritable bowel syndrome)   . Chronic headaches   . Hemorrhoids    Past Surgical History  Procedure Date  . Laparoscopy 08/2004    endometriosis - several   . Breast surgery     left nipple removed - benign tumor  . Anterior cruciate ligament repair 1996    small tear of ACL  . Bunionectomy   . Tonsillectomy   . Abdominal hysterectomy    History  Substance Use Topics  . Smoking status: Never Smoker   . Smokeless tobacco: Never Used  . Alcohol Use: No   Family History  Problem Relation Age of Onset  . Hypertension Mother   . Thyroid disease Mother   . Fibromyalgia Mother   . Diabetes Father   . Kidney cancer Maternal Grandmother   . Colon cancer Neg Hx   . Colon polyps Mother    Allergies  Allergen Reactions  . Cetirizine Hcl     REACTION: not effective  . Erythromycin     REACTION: stomach hurts  . Escitalopram Oxalate     REACTION: no improvement  . Lisinopril     REACTION: cough  . Loratadine     REACTION: not effective  . Nifedipine     REACTION: fatigued and tremor  . Sertraline Hcl     REACTION: no help   Current  Outpatient Prescriptions on File Prior to Visit  Medication Sig Dispense Refill  . acetaminophen (TYLENOL) 500 MG tablet OTC as directed.       Marland Kitchen albuterol (PROAIR HFA) 108 (90 BASE) MCG/ACT inhaler Inhale 2 puffs into the lungs every 4 (four) hours as needed for wheezing. for wheezing.  3 Inhaler  3  . amphetamine-dextroamphetamine (ADDERALL, 20MG ,) 20 MG tablet Take one by mouth in am and one in afternoon  60 tablet  0  . Azelastine HCl (ASTEPRO) 0.15 % SOLN 1-2 sprays in each nostril two times a day as needed.       Marland Kitchen buPROPion (WELLBUTRIN XL) 300 MG 24 hr tablet Take 1 tablet (300 mg total) by mouth every morning.  30 tablet  11  . diphenhydrAMINE (BENADRYL) 25 mg capsule OTC as directed.       . fexofenadine-pseudoephedrine (ALLEGRA-D 24) 180-240 MG per 24 hr tablet OTC as directed.       . Fluticasone-Salmeterol (ADVAIR DISKUS) 250-50 MCG/DOSE AEPB Rinse mouth. Use 1 puff, twice daily  60 each  prn  . ibuprofen (ADVIL,MOTRIN) 200 MG tablet Take 400 mg by mouth every 6 (six)  hours as needed.        . methocarbamol (ROBAXIN) 500 MG tablet Take 500 mg by mouth. One at bedtime       . mometasone (ELOCON) 0.1 % cream Apply to affected areas on hands once daily as needed.       . mometasone (NASONEX) 50 MCG/ACT nasal spray 1-2 puffs each nostril once daily.       . montelukast (SINGULAIR) 10 MG tablet Take 1 tablet (10 mg total) by mouth at bedtime. 1 by mouth once daily for asthma and allergy symptoms.  90 tablet  3  . olmesartan (BENICAR) 40 MG tablet Take 40 mg by mouth daily.        . Olopatadine HCl (PATADAY) 0.2 % SOLN Place 1 drop into both eyes at bedtime.        . Pseudoeph-Doxylamine-DM-APAP (NYQUIL PO) Take by mouth as directed.        . traMADol (ULTRAM) 50 MG tablet Take 1 tablet (50 mg total) by mouth 3 (three) times daily as needed for pain (watch for sedation ).  90 tablet  2   The PMH, PSH, Social History, Family History, Medications, and allergies have been reviewed in [facility], and  have been updated if relevant.  OBJECTIVE: LMP 08/05/2006 BP 136/94  Pulse 87  Temp(Src) 98.6 F (37 C) (Oral)  Ht 5' 6.5" (1.689 m)  Wt 181 lb 8 oz (82.328 kg)  BMI 28.86 kg/m2  LMP 08/05/2006   She appears well, vital signs are as noted. Ears normal.  Throat and pharynx normal.  Neck supple. No adenopathy in the neck. Nose is congested. Sinuses non tender. The chest is clear, without wheezes or rales.  ASSESSMENT:  viral upper respiratory illness  PLAN: Symptomatic therapy suggested: push fluids, rest and return office visit prn if symptoms persist or worsen. Lack of antibiotic effectiveness discussed with her. Given rx for Augmentin to fill if symptoms do not continue to improve.  Tussionex as needed for cough- see pt instructions for details.  Call or return to clinic prn if these symptoms worsen or fail to improve as anticipated.

## 2010-10-20 LAB — COMPREHENSIVE METABOLIC PANEL
ALT: 10
AST: 21
CO2: 26
Calcium: 9.1
Chloride: 102
GFR calc Af Amer: >60
GFR calc non Af Amer: >60
Potassium: 3.7
Sodium: 136

## 2010-10-20 LAB — CBC
MCHC: 33.6
MCHC: 33.3
Platelets: 313
RBC: 5.23 — ABNORMAL HIGH
RDW: 14.5 — ABNORMAL HIGH
WBC: 6.8

## 2010-10-23 ENCOUNTER — Telehealth: Payer: Self-pay | Admitting: *Deleted

## 2010-10-23 MED ORDER — FLUCONAZOLE 150 MG PO TABS: 150.0000 mg | ORAL_TABLET | Freq: Once | ORAL | Status: AC

## 2010-10-23 NOTE — Telephone Encounter (Signed)
Patient notified as instructed by telephone. 

## 2010-10-23 NOTE — Telephone Encounter (Signed)
Pt has been taking augmentin for URI and now she has a yeast infection.  She is asking that diflucan be sent to [address].

## 2010-10-23 NOTE — Telephone Encounter (Signed)
rx sent

## 2010-10-29 ENCOUNTER — Encounter: Payer: Self-pay | Admitting: Family Medicine

## 2010-10-29 ENCOUNTER — Telehealth: Payer: Self-pay | Admitting: *Deleted

## 2010-10-29 ENCOUNTER — Ambulatory Visit (INDEPENDENT_AMBULATORY_CARE_PROVIDER_SITE_OTHER): Payer: BC Managed Care – PPO | Admitting: Family Medicine

## 2010-10-29 VITALS — BP 124/86 | HR 88 | Temp 97.9°F | Ht 66.5 in | Wt 182.8 lb

## 2010-10-29 DIAGNOSIS — Z23 Encounter for immunization: Secondary | ICD-10-CM

## 2010-10-29 DIAGNOSIS — F988 Other specified behavioral and emotional disorders with onset usually occurring in childhood and adolescence: Secondary | ICD-10-CM

## 2010-10-29 MED ORDER — LISDEXAMFETAMINE DIMESYLATE 30 MG PO CAPS: 30.0000 mg | ORAL_CAPSULE | ORAL | Status: AC

## 2010-10-29 NOTE — Telephone Encounter (Signed)
Thanks that is fine 

## 2010-10-29 NOTE — Telephone Encounter (Signed)
Pt called to let you know that she is going to try the vyvanse that you prescribed her today.  She will probably need a prior auth for this, I will be looking for notification from the pharmacy.

## 2010-10-29 NOTE — Telephone Encounter (Signed)
Done in IN box 

## 2010-10-29 NOTE — Progress Notes (Signed)
Subjective:    Patient ID: [name], female    DOB: [birth year], 45 y.o.   MRN: [id number]  HPI Here for f/u of ADD  Last visit started adderal xr 20 in am and then 10 in afternoon Cannot tell very much difference at all - is dissapointed   A little improvement - paying attention to someone talking to her  No side eff at all  Has not changed her appetite   Mood has been ok for the most part -- moments of sadness/ irritability as expected  Is still on wellbutrin - that helped mood but not her ADD  Patient Active Problem List  Diagnoses  . DEPRESSION  . OBSTRUCTIVE SLEEP APNEA  . HYPERTENSION  . ALLERGIC RHINITIS  . ASTHMA  . GERD  . IBS  . ENDOMETRIOSIS  . FEMALE INFERTILITY  . DYSHIDROTIC ECZEMA, HANDS  . BACK PAIN  . FIBROMYALGIA  . INSOMNIA  . ADD (attention deficit disorder)   Past Medical History  Diagnosis Date  . Allergy     allergic rhinitis  . Asthma   . Depression   . GERD (gastroesophageal reflux disease)   . Hypertension   . Fibromyalgia   . Degenerative disc disease   . Sleep apnea   . Fx of fibula 2009  . IBS (irritable bowel syndrome)   . Chronic headaches   . Hemorrhoids    Past Surgical History  Procedure Date  . Laparoscopy 08/2004    endometriosis - several   . Breast surgery     left nipple removed - benign tumor  . Anterior cruciate ligament repair 1996    small tear of ACL  . Bunionectomy   . Tonsillectomy   . Abdominal hysterectomy    History  Substance Use Topics  . Smoking status: Never Smoker   . Smokeless tobacco: Never Used  . Alcohol Use: No   Family History  Problem Relation Age of Onset  . Hypertension Mother   . Thyroid disease Mother   . Fibromyalgia Mother   . Diabetes Father   . Kidney cancer Maternal Grandmother   . Colon cancer Neg Hx   . Colon polyps Mother    Allergies  Allergen Reactions  . Cetirizine Hcl     REACTION: not effective  . Erythromycin     REACTION: stomach hurts  .  Escitalopram Oxalate     REACTION: no improvement  . Lisinopril     REACTION: cough  . Loratadine     REACTION: not effective  . Nifedipine     REACTION: fatigued and tremor  . Sertraline Hcl     REACTION: no help   Current Outpatient Prescriptions on File Prior to Visit  Medication Sig Dispense Refill  . albuterol (PROAIR HFA) 108 (90 BASE) MCG/ACT inhaler Inhale 2 puffs into the lungs every 4 (four) hours as needed for wheezing. for wheezing.  3 Inhaler  3  . buPROPion (WELLBUTRIN XL) 300 MG 24 hr tablet Take 1 tablet (300 mg total) by mouth every morning.  30 tablet  11  . chlorpheniramine-hydrocodone (TUSSIONEX) 8-10 MG/5ML suspension Take 5 mLs by mouth every 12 (twelve) hours as needed for cough.  60 mL  0  . diphenhydrAMINE (BENADRYL) 25 mg capsule OTC as directed.       . Fluticasone-Salmeterol (ADVAIR DISKUS) 250-50 MCG/DOSE AEPB Rinse mouth. Use 1 puff, twice daily  60 each  prn  . ibuprofen (ADVIL,MOTRIN) 200 MG tablet Take 400 mg by mouth every 6 (  six) hours as needed.        . montelukast (SINGULAIR) 10 MG tablet Take 1 tablet (10 mg total) by mouth at bedtime. 1 by mouth once daily for asthma and allergy symptoms.  90 tablet  3  . olmesartan (BENICAR) 40 MG tablet Take 40 mg by mouth daily.        . Pseudoeph-Doxylamine-DM-APAP (NYQUIL PO) Take by mouth as directed.        . traMADol (ULTRAM) 50 MG tablet Take 1 tablet (50 mg total) by mouth 3 (three) times daily as needed for pain (watch for sedation ).  90 tablet  2  . acetaminophen (TYLENOL) 500 MG tablet OTC as directed.       . Azelastine HCl (ASTEPRO) 0.15 % SOLN 1-2 sprays in each nostril two times a day as needed.       . fexofenadine-pseudoephedrine (ALLEGRA-D 24) 180-240 MG per 24 hr tablet OTC as directed.       . methocarbamol (ROBAXIN) 500 MG tablet Take 500 mg by mouth. One at bedtime       . mometasone (ELOCON) 0.1 % cream Apply to affected areas on hands once daily as needed.       . mometasone (NASONEX) 50  MCG/ACT nasal spray 1-2 puffs each nostril once daily.       . Olopatadine HCl (PATADAY) 0.2 % SOLN Place 1 drop into both eyes at bedtime.             Review of Systems Review of Systems  Constitutional: Negative for fever, appetite change, fatigue and unexpected weight change.  Eyes: Negative for pain and visual disturbance.  Respiratory: Negative for cough and shortness of breath.   Cardiovascular: Negative for cp or palpitations    Gastrointestinal: Negative for nausea, diarrhea and constipation.  Genitourinary: Negative for urgency and frequency.  Skin: Negative for pallor or rash   Neurological: Negative for weakness, light-headedness, numbness and headaches.  Hematological: Negative for adenopathy. Does not bruise/bleed easily.  Psychiatric/Behavioral: times of depression but overall better , not anxious , pos for problems with attention and concentration          Objective:   Physical Exam  Constitutional: She appears well-developed and well-nourished. No distress.       overwt and well appearing   HENT:  Head: Normocephalic and atraumatic.  Mouth/Throat: Oropharynx is clear and moist.  Eyes: Conjunctivae and EOM are normal. Pupils are equal, round, and reactive to light.  Neck: Normal range of motion. Neck supple. No thyromegaly present.  Cardiovascular: Normal rate, regular rhythm and normal heart sounds.   Pulmonary/Chest: Effort normal and breath sounds normal.  Musculoskeletal: She exhibits no edema.  Neurological: She is alert. She has normal reflexes. She exhibits normal muscle tone. Coordination normal.       No tremor   Skin: Skin is warm and dry. No rash noted.  Psychiatric: She has a normal mood and affect.       Pt pays attention , no confusion Nl eye contact and comm skills           Assessment & Plan:

## 2010-10-29 NOTE — Telephone Encounter (Signed)
Prior auth form from [place] is on your shelf.

## 2010-10-29 NOTE — Patient Instructions (Signed)
Since the adderall is not working at all - lets change to something else My first choice is vyvanse -- here is a px  If your insurance does not cover this or you cannot afford it -- please let me know  Other choices would be metadate or concerta -- (you could get a list of what is covered for ADD and let me know ) In any case , if you do start the vyvanse - call and update me in 2 weeks with how you are doing

## 2010-10-30 NOTE — Telephone Encounter (Signed)
Form faxed

## 2010-11-06 ENCOUNTER — Encounter: Payer: Self-pay | Admitting: Internal Medicine

## 2010-11-06 ENCOUNTER — Ambulatory Visit (INDEPENDENT_AMBULATORY_CARE_PROVIDER_SITE_OTHER): Payer: BC Managed Care – PPO | Admitting: Internal Medicine

## 2010-11-06 VITALS — BP 136/80 | HR 100 | Ht 66.5 in | Wt 181.6 lb

## 2010-11-06 DIAGNOSIS — G4733 Obstructive sleep apnea (adult) (pediatric): Secondary | ICD-10-CM

## 2010-11-06 DIAGNOSIS — J309 Allergic rhinitis, unspecified: Secondary | ICD-10-CM

## 2010-11-06 MED ORDER — PHENYLEPHRINE HCL 1 % NA SOLN
3.0000 [drp] | Freq: Once | NASAL | Status: AC
  Administered 2010-11-06: 3 [drp] via NASAL

## 2010-11-06 MED ORDER — METHYLPREDNISOLONE ACETATE 80 MG/ML IJ SUSP
80.0000 mg | Freq: Once | INTRAMUSCULAR | Status: AC
  Administered 2010-11-06: 80 mg via INTRAMUSCULAR

## 2010-11-06 NOTE — Patient Instructions (Signed)
Neb neo nasaal  Depo 80  Script to hold for doxycycline antibiotic to use if needed  This is a good time to be using the Neti pot to get the glue out  Mucinex and a decongestant like sudafed may help  Schedule return off all antihistamines for allergy skin testing.  This will include your Pataday, cough and cold meds, otc sleep and cough meds.

## 2010-11-06 NOTE — Progress Notes (Signed)
Patient ID: [name], female    DOB: 1965-03-22, 45 y.o.   MRN: [id number]  HPI 29 yoF followed for obstructive sleep apnea and allergic rhinitis, complicated by insomnia, GERD, depression. Last here March 9 when we gave depo shot which made her much better. She was able to go back to full time use of her CPAP machine - 11 cwp Advanced. We discussed her meds. She feels well today.   11/06/10- 44 yoF followed for obstructive sleep apnea and allergic rhinitis, complicated by insomnia, GERD, depression. Has had flu vaccine. Had sustained head congestion and productive cough one month ago. Her primary physician treated Augmentin and cough syrup. She was clear after that until 4 days ago. One day after her flu shot, she woke with scratchy throat, no fever, increased head congestion.Unable to use CPAP while her head is congested.her ophthalmologist has asked her to get allergy testing done.  Years ago, allergy testing was positive and she was on allergy vaccine for a while. She does not remember details or response.  Review of Systems   see HPI Constitutional:   No-   weight loss, night sweats, fevers, chills, fatigue, lassitude. HEENT:   No-  headaches, difficulty swallowing, tooth/dental problems, sore throat,       +  sneezing, itching, ear ache, nasal congestion, post nasal drip,  CV:  No-   chest pain, orthopnea, PND, swelling in lower extremities, anasarca, dizziness, palpitations Resp: No-   shortness of breath with exertion or at rest.              No-   productive cough,  Occasional non-productive cough,  No- coughing up of blood.              No-   change in color of mucus.  No- wheezing.   Skin: No-   rash or lesions. GI:  No-   heartburn, indigestion, abdominal pain, nausea, vomiting, diarrhea,                 change in bowel habits, loss of appetite GU: No-   dysuria, change in color of urine, no urgency or frequency.  No- flank pain. MS:  No-   joint pain or swelling.  No-  decreased range of motion.  No- back pain. Neuro-     nothing unusual Psych:  No- change in mood or affect. No depression or anxiety.  No memory loss.    Objective:   Physical Exam General- Alert, Oriented, Affect-appropriate, Distress- none acute Skin- rash-none, lesions- none, excoriation- none Lymphadenopathy- none Head- atraumatic            Eyes- Gross vision intact, PERRLA, conjunctivae clear secretions            Ears- Hearing, canals-normal            Nose- Thick mucus, no-Septal dev, mucus, polyps, erosion, perforation             Throat- Mallampati II , mucosa red , drainage- none, tonsils- atrophic Neck- flexible , trachea midline, no stridor , thyroid nl, carotid no bruit Chest - symmetrical excursion , unlabored           Heart/CV- RRR , no murmur , no gallop  , no rub, nl s1 s2                           - JVD- none , edema- none, stasis changes- none, varices- none  Lung- clear to P&A, wheeze- none, cough- + dry , dullness-none, rub- none           Chest wall-  Abd- tender-no, distended-no, bowel sounds-present, HSM- no Br/ Gen/ Rectal- Not done, not indicated Extrem- cyanosis- none, clubbing, none, atrophy- none, strength- nl Neuro- grossly intact to observation

## 2010-11-08 NOTE — Assessment & Plan Note (Addendum)
She has a history of allergic rhinitis and her ophthalmologist suspects allergic conjunctivitis. We have discussed allergy skin testing and will schedule that. The current illness is more consistent with an upper respiratory infection and bronchitis.

## 2010-11-08 NOTE — Assessment & Plan Note (Signed)
She had been doing well with CPAP until she began having sinus congestion problems. Hopefully that is now coming under control as discussed.

## 2010-11-10 NOTE — Telephone Encounter (Signed)
Prior auth given for vyvanse, pharmacy is aware.  Approval letter placed on doctor's desk for signature and scanning.

## 2010-12-08 ENCOUNTER — Telehealth: Payer: Self-pay | Admitting: Internal Medicine

## 2010-12-08 NOTE — Telephone Encounter (Signed)
Could be the vyvanse.... Stop it for 3-4 days and then call back to update me on headache status

## 2010-12-08 NOTE — Telephone Encounter (Signed)
Patient called and stated she is having bad headaches and she is wondering if switching her to Vyvanse is a side effect for that medication or could something else be causing it.  Please advise.

## 2010-12-08 NOTE — Telephone Encounter (Signed)
Patient notified as instructed by telephone. 

## 2010-12-17 ENCOUNTER — Telehealth: Payer: Self-pay | Admitting: Internal Medicine

## 2010-12-17 MED ORDER — METHYLPHENIDATE HCL ER (OSM) 18 MG PO TBCR: 18.0000 mg | EXTENDED_RELEASE_TABLET | Freq: Every day | ORAL | Status: DC

## 2010-12-17 NOTE — Telephone Encounter (Signed)
Patient notified as instructed by telephone. Pt has already picked up rx and will call back in 2 weeks to update how doing.

## 2010-12-17 NOTE — Telephone Encounter (Signed)
Lets try concerta Px printed for pick up in IN box Let me know how it goes

## 2010-12-17 NOTE — Telephone Encounter (Signed)
Patient called and stated that since she stopped taking the Vyvanse her headaches have improved greatly.

## 2010-12-17 NOTE — Telephone Encounter (Signed)
Patient notified as instructed by telephone. Pt said she would like to try something else. Pt said if she noticed any side effects from the next med she would call and update Dr Milinda Antis. Pt said to call [phone] when rx was ready for pick up.

## 2010-12-17 NOTE — Telephone Encounter (Signed)
Thanks- will stay off that  Does she want to go back to the adderall or try something else ?

## 2010-12-25 ENCOUNTER — Telehealth: Payer: Self-pay | Admitting: Internal Medicine

## 2010-12-25 NOTE — Telephone Encounter (Signed)
Patient called and would like to know the status on her prior auth on her concerta.

## 2010-12-25 NOTE — Telephone Encounter (Signed)
Notice of rejection came this afternoon via fax from [place]. Pt notified and she said that is OK if it is not approved until after Christmas that will be OK. Will call for prior auth form.

## 2011-01-07 ENCOUNTER — Ambulatory Visit (INDEPENDENT_AMBULATORY_CARE_PROVIDER_SITE_OTHER): Payer: BC Managed Care – PPO | Admitting: Internal Medicine

## 2011-01-07 ENCOUNTER — Encounter: Payer: Self-pay | Admitting: Internal Medicine

## 2011-01-07 VITALS — BP 130/98 | HR 106 | Ht 66.5 in | Wt 180.0 lb

## 2011-01-07 DIAGNOSIS — G4733 Obstructive sleep apnea (adult) (pediatric): Secondary | ICD-10-CM

## 2011-01-07 DIAGNOSIS — J309 Allergic rhinitis, unspecified: Secondary | ICD-10-CM

## 2011-01-07 DIAGNOSIS — J45909 Unspecified asthma, uncomplicated: Secondary | ICD-10-CM

## 2011-01-07 NOTE — Patient Instructions (Signed)
The allergy lab will contact you when your allergy vaccine is ready to start.   Ok to resume your regular medicines as needed

## 2011-01-07 NOTE — Assessment & Plan Note (Signed)
The particular initiative has, from her ophthalmologist who has been trying to manage allergic conjunctivitis. She is significantly atopic. We have reviewed the basics of environmental dust and allergen precautions, her previous experience with antihistamines and nasal sprays, and alternatives. We have carefully discussed realistic goals and real risks of allergy vaccine including possible anaphylaxis, and possibility of no benefit. She wishes to start allergy vaccine.

## 2011-01-07 NOTE — Progress Notes (Signed)
Patient ID: [name], female    DOB: 1965-03-29, 46 y.o.   MRN: [id number]  HPI 60 yoF followed for obstructive sleep apnea and allergic rhinitis, complicated by insomnia, GERD, depression. Last here March 9 when we gave depo shot which made her much better. She was able to go back to full time use of her CPAP machine - 11 cwp Advanced. We discussed her meds. She feels well today.   11/06/10- 44 yoF followed for obstructive sleep apnea and allergic rhinitis, complicated by insomnia, GERD, depression. Has had flu vaccine. Had sustained head congestion and productive cough one month ago. Her primary physician treated Augmentin and cough syrup. She was clear after that until 4 days ago. One day after her flu shot, she woke with scratchy throat, no fever, increased head congestion.Unable to use CPAP while her head is congested.her ophthalmologist has asked her to get allergy testing done.  Years ago, allergy testing was positive and she was on allergy vaccine for a while. She does not remember details or response.  01/07/11-  44 yoF followed for obstructive sleep apnea and allergic rhinitis, complicated by insomnia, GERD, depression. Comes today for allergy skin testing off antihistamines. No recent respiratory infections. Wheeze very occasionally with exertion.  Anticipates seasonal flare as February pollens start. Had flu vaccine. Uses CPAP regularly "as long as I can breathe". Allergy Skin tests- significant positive reactions for numerous, and inhalant allergens, particularly grass weed and tree pollens, dust mite, cat and some molds. She wishes to start allergy vaccine.  Review of Systems   see HPI Constitutional:   No-   weight loss, night sweats, fevers, chills, fatigue, lassitude. HEENT:   No-  headaches, difficulty swallowing, tooth/dental problems, sore throat,       +  sneezing, itching, ear ache, nasal congestion, post nasal drip,  CV:  No-   chest pain, orthopnea, PND, swelling in  lower extremities, anasarca, dizziness, palpitations Resp: No-   shortness of breath with exertion or at rest.              No-   productive cough,  Occasional non-productive cough,  No- coughing up of blood.              No-   change in color of mucus.  No- wheezing.   Skin: No-   rash or lesions. GI:  No-   heartburn, indigestion, abdominal pain, nausea, vomiting, diarrhea,                 change in bowel habits, loss of appetite GU: MS:   Neuro-     nothing unusual Psych:  No- change in mood or affect. No depression or anxiety.  No memory loss.    Objective:   Physical Exam General- Alert, Oriented, Affect-appropriate, Distress- none acute Skin- rash-none, lesions- none, excoriation- none Lymphadenopathy- none Head- atraumatic            Eyes- Gross vision intact, PERRLA, conjunctivae clear secretions            Ears- Hearing, canals-normal            Nose- mucus bridging, no-Septal dev, polyps, erosion, perforation             Throat- Mallampati II , mucosa red , drainage- none, tonsils- atrophic Neck- flexible , trachea midline, no stridor , thyroid nl, carotid no bruit Chest - symmetrical excursion , unlabored           Heart/CV- RRR ,  no murmur , no gallop  , no rub, nl s1 s2                           - JVD- none , edema- none, stasis changes- none, varices- none           Lung- clear to P&A, wheeze- none, cough- + dry noted once , dullness-none, rub- none           Chest wall-  Abd- tender-no, distended-no, bowel sounds-present, HSM- no Br/ Gen/ Rectal- Not done, not indicated Extrem- cyanosis- none, clubbing, none, atrophy- none, strength- nl Neuro- grossly intact to observation

## 2011-01-07 NOTE — Assessment & Plan Note (Signed)
Controlled. She has avoided recent viral infections in the community.

## 2011-01-07 NOTE — Assessment & Plan Note (Signed)
She is generally compliant and controlled by CPAP, hindered by nasal congestion.

## 2011-01-08 ENCOUNTER — Ambulatory Visit (INDEPENDENT_AMBULATORY_CARE_PROVIDER_SITE_OTHER): Payer: BC Managed Care – PPO

## 2011-01-08 DIAGNOSIS — J309 Allergic rhinitis, unspecified: Secondary | ICD-10-CM

## 2011-01-09 ENCOUNTER — Telehealth: Payer: Self-pay | Admitting: Family Medicine

## 2011-01-09 ENCOUNTER — Telehealth: Payer: Self-pay | Admitting: *Deleted

## 2011-01-09 NOTE — Telephone Encounter (Signed)
Calling to check on Concerta referral.  Please call

## 2011-01-09 NOTE — Telephone Encounter (Signed)
Called pt.about starting her allergy vac.Marland Kitchen She will be starting next Mon.,coming in twice a wk. For the next 3 and a half months.

## 2011-01-12 ENCOUNTER — Ambulatory Visit (INDEPENDENT_AMBULATORY_CARE_PROVIDER_SITE_OTHER): Payer: BC Managed Care – PPO

## 2011-01-12 DIAGNOSIS — J309 Allergic rhinitis, unspecified: Secondary | ICD-10-CM

## 2011-01-12 NOTE — Telephone Encounter (Signed)
Pt. Came in for first shot today.(1:50,000 at 0.1) She had a pinhead size reaction.(red and a little raised) Mrs. Branaman said the site itched at first but then it stopped almost immediately. Should I give her 0.05 next time or repeat the dose and have her wait?

## 2011-01-13 NOTE — Telephone Encounter (Signed)
Please repeat the dose and have her wait. She may need to advance only very slowly.

## 2011-01-13 NOTE — Telephone Encounter (Signed)
I called on 12/25/10 requested form but I cannot find form(? If received form). I called [phone] spoke with Bernita Buffy and she faxed prior auth form. Prior authorization form is on shelf in the in box. Pt request call back when approved.

## 2011-01-13 NOTE — Telephone Encounter (Signed)
Done and in IN box Please make sure it gets scanned, thank

## 2011-01-13 NOTE — Telephone Encounter (Signed)
Completed form faxed to [phone]. Will wait for response and form is at [employer].

## 2011-01-13 NOTE — Telephone Encounter (Signed)
Noted  

## 2011-01-14 NOTE — Telephone Encounter (Signed)
Patient notified as instructed by telephone. 

## 2011-01-14 NOTE — Telephone Encounter (Signed)
Completed prior auth approval faxed to [facility] [place] [phone]. All forms for this prior auth sent to be scanned. Left v/m for pt to call back.

## 2011-01-15 ENCOUNTER — Ambulatory Visit (INDEPENDENT_AMBULATORY_CARE_PROVIDER_SITE_OTHER): Payer: BC Managed Care – PPO

## 2011-01-15 DIAGNOSIS — J309 Allergic rhinitis, unspecified: Secondary | ICD-10-CM

## 2011-01-19 ENCOUNTER — Ambulatory Visit (INDEPENDENT_AMBULATORY_CARE_PROVIDER_SITE_OTHER): Payer: BC Managed Care – PPO

## 2011-01-19 ENCOUNTER — Encounter: Payer: Self-pay | Admitting: Internal Medicine

## 2011-01-19 DIAGNOSIS — J309 Allergic rhinitis, unspecified: Secondary | ICD-10-CM

## 2011-01-22 ENCOUNTER — Ambulatory Visit (INDEPENDENT_AMBULATORY_CARE_PROVIDER_SITE_OTHER): Payer: BC Managed Care – PPO

## 2011-01-22 DIAGNOSIS — J309 Allergic rhinitis, unspecified: Secondary | ICD-10-CM

## 2011-01-27 ENCOUNTER — Ambulatory Visit (INDEPENDENT_AMBULATORY_CARE_PROVIDER_SITE_OTHER): Payer: BC Managed Care – PPO

## 2011-01-27 DIAGNOSIS — J309 Allergic rhinitis, unspecified: Secondary | ICD-10-CM

## 2011-01-30 ENCOUNTER — Ambulatory Visit (INDEPENDENT_AMBULATORY_CARE_PROVIDER_SITE_OTHER): Payer: BC Managed Care – PPO

## 2011-01-30 DIAGNOSIS — J309 Allergic rhinitis, unspecified: Secondary | ICD-10-CM

## 2011-02-02 ENCOUNTER — Ambulatory Visit (INDEPENDENT_AMBULATORY_CARE_PROVIDER_SITE_OTHER): Payer: BC Managed Care – PPO

## 2011-02-02 DIAGNOSIS — J309 Allergic rhinitis, unspecified: Secondary | ICD-10-CM

## 2011-02-05 ENCOUNTER — Ambulatory Visit (INDEPENDENT_AMBULATORY_CARE_PROVIDER_SITE_OTHER): Payer: BC Managed Care – PPO

## 2011-02-05 DIAGNOSIS — J309 Allergic rhinitis, unspecified: Secondary | ICD-10-CM

## 2011-02-09 ENCOUNTER — Ambulatory Visit (INDEPENDENT_AMBULATORY_CARE_PROVIDER_SITE_OTHER): Payer: BC Managed Care – PPO

## 2011-02-09 DIAGNOSIS — J309 Allergic rhinitis, unspecified: Secondary | ICD-10-CM

## 2011-02-12 ENCOUNTER — Ambulatory Visit (INDEPENDENT_AMBULATORY_CARE_PROVIDER_SITE_OTHER): Payer: BC Managed Care – PPO

## 2011-02-12 DIAGNOSIS — J309 Allergic rhinitis, unspecified: Secondary | ICD-10-CM

## 2011-02-16 ENCOUNTER — Ambulatory Visit (INDEPENDENT_AMBULATORY_CARE_PROVIDER_SITE_OTHER): Payer: BC Managed Care – PPO

## 2011-02-16 DIAGNOSIS — J309 Allergic rhinitis, unspecified: Secondary | ICD-10-CM

## 2011-02-19 ENCOUNTER — Ambulatory Visit (INDEPENDENT_AMBULATORY_CARE_PROVIDER_SITE_OTHER): Payer: BC Managed Care – PPO

## 2011-02-19 DIAGNOSIS — J309 Allergic rhinitis, unspecified: Secondary | ICD-10-CM

## 2011-02-24 ENCOUNTER — Ambulatory Visit (INDEPENDENT_AMBULATORY_CARE_PROVIDER_SITE_OTHER): Payer: BC Managed Care – PPO

## 2011-02-24 ENCOUNTER — Other Ambulatory Visit: Payer: Self-pay | Admitting: *Deleted

## 2011-02-24 DIAGNOSIS — J309 Allergic rhinitis, unspecified: Secondary | ICD-10-CM

## 2011-02-24 MED ORDER — METHYLPHENIDATE HCL ER (OSM) 18 MG PO TBCR: 18.0000 mg | EXTENDED_RELEASE_TABLET | Freq: Every day | ORAL | Status: DC

## 2011-02-24 NOTE — Telephone Encounter (Signed)
Patient called requesting a refill on Concerta. Please call when ready for pickup. 

## 2011-02-24 NOTE — Telephone Encounter (Signed)
Patient notified as instructed by telephone. Prescription left at front desk.  

## 2011-02-24 NOTE — Telephone Encounter (Signed)
Px printed for pick up in IN box  

## 2011-02-27 ENCOUNTER — Ambulatory Visit (INDEPENDENT_AMBULATORY_CARE_PROVIDER_SITE_OTHER): Payer: BC Managed Care – PPO

## 2011-02-27 DIAGNOSIS — J309 Allergic rhinitis, unspecified: Secondary | ICD-10-CM

## 2011-03-02 ENCOUNTER — Ambulatory Visit (INDEPENDENT_AMBULATORY_CARE_PROVIDER_SITE_OTHER): Payer: BC Managed Care – PPO

## 2011-03-02 DIAGNOSIS — J309 Allergic rhinitis, unspecified: Secondary | ICD-10-CM

## 2011-03-04 ENCOUNTER — Encounter: Payer: Self-pay | Admitting: Internal Medicine

## 2011-03-05 ENCOUNTER — Ambulatory Visit (INDEPENDENT_AMBULATORY_CARE_PROVIDER_SITE_OTHER): Payer: BC Managed Care – PPO

## 2011-03-05 DIAGNOSIS — J309 Allergic rhinitis, unspecified: Secondary | ICD-10-CM

## 2011-03-06 ENCOUNTER — Ambulatory Visit: Payer: BC Managed Care – PPO

## 2011-03-09 ENCOUNTER — Ambulatory Visit (INDEPENDENT_AMBULATORY_CARE_PROVIDER_SITE_OTHER): Payer: BC Managed Care – PPO

## 2011-03-09 DIAGNOSIS — J309 Allergic rhinitis, unspecified: Secondary | ICD-10-CM

## 2011-03-12 ENCOUNTER — Encounter: Payer: Self-pay | Admitting: Internal Medicine

## 2011-03-12 ENCOUNTER — Ambulatory Visit (INDEPENDENT_AMBULATORY_CARE_PROVIDER_SITE_OTHER): Payer: BC Managed Care – PPO | Admitting: Internal Medicine

## 2011-03-12 ENCOUNTER — Ambulatory Visit (INDEPENDENT_AMBULATORY_CARE_PROVIDER_SITE_OTHER): Payer: BC Managed Care – PPO

## 2011-03-12 VITALS — BP 134/84 | HR 102 | Ht 68.0 in | Wt 182.0 lb

## 2011-03-12 DIAGNOSIS — G4733 Obstructive sleep apnea (adult) (pediatric): Secondary | ICD-10-CM

## 2011-03-12 DIAGNOSIS — J309 Allergic rhinitis, unspecified: Secondary | ICD-10-CM

## 2011-03-12 MED ORDER — MOMETASONE FUROATE 50 MCG/ACT NA SUSP: 2.0000 | Freq: Every day | NASAL | Status: DC

## 2011-03-12 MED ORDER — MONTELUKAST SODIUM 10 MG PO TABS: ORAL_TABLET | ORAL | Status: DC

## 2011-03-12 NOTE — Patient Instructions (Signed)
Scripts sent for Singulair and Nasonex  Ok to try different antihistamines with decongestant  Continue allergy vaccine build-up

## 2011-03-12 NOTE — Progress Notes (Signed)
Patient ID: [name], female    DOB: [birth year], 46 y.o.   MRN: [id number]  HPI 40 yoF followed for obstructive sleep apnea and allergic rhinitis, complicated by insomnia, GERD, depression. Last here March 9 when we gave depo shot which made her much better. She was able to go back to full time use of her CPAP machine - 11 cwp Advanced. We discussed her meds. She feels well today.   11/06/10- 44 yoF followed for obstructive sleep apnea and allergic rhinitis, complicated by insomnia, GERD, depression. Has had flu vaccine. Had sustained head congestion and productive cough one month ago. Her primary physician treated Augmentin and cough syrup. She was clear after that until 4 days ago. One day after her flu shot, she woke with scratchy throat, no fever, increased head congestion.Unable to use CPAP while her head is congested.her ophthalmologist has asked her to get allergy testing done.  Years ago, allergy testing was positive and she was on allergy vaccine for a while. She does not remember details or response.  01/07/11-  44 yoF followed for obstructive sleep apnea and allergic rhinitis, complicated by insomnia, GERD, depression. Comes today for allergy skin testing off antihistamines. No recent respiratory infections. Wheeze very occasionally with exertion.  Anticipates seasonal flare as February pollens start. Had flu vaccine. Uses CPAP regularly "as long as I can breathe". Allergy Skin tests- significant positive reactions for numerous, and inhalant allergens, particularly grass weed and tree pollens, dust mite, cat and some molds. She wishes to start allergy vaccine.  03/12/11-  44 yoF followed for obstructive sleep apnea and allergic rhinitis, complicated by insomnia, GERD, depression. Wearing CPAP less consistently in the last 2 weeks because of nasal congestion. Building allergy vaccine without problems. Taking Allegra-D plus Sudafed. We discussed this. Using saline nasal rinse. Continues  Advair 250. Using rescue inhaler about twice a week.  Review of Systems- see HPI Constitutional:   No-   weight loss, night sweats, fevers, chills, fatigue, lassitude. HEENT:   No-  headaches, difficulty swallowing, tooth/dental problems, sore throat,       +  sneezing, itching, ear ache, nasal congestion, post nasal drip,  CV:  No-   chest pain, orthopnea, PND, swelling in lower extremities, anasarca, dizziness, palpitations Resp: No-   shortness of breath with exertion or at rest.              No-   productive cough,  Occasional non-productive cough,  No- coughing up of blood.              No-   change in color of mucus.  No- wheezing.   Skin: No-   rash or lesions. GI:  No-   heartburn, indigestion, abdominal pain, nausea, vomiting, diarrhea,                 change in bowel habits, loss of appetite GU: MS:   Neuro-     nothing unusual Psych:  No- change in mood or affect. No depression or anxiety.  No memory loss.    Objective:   Physical Exam General- Alert, Oriented, Affect-appropriate, Distress- none acute Skin- rash-none, lesions- none, excoriation- none Lymphadenopathy- none Head- atraumatic            Eyes- Gross vision intact, PERRLA, conjunctivae clear secretions            Ears- Hearing, canals-normal            Nose- mucus bridging, turinate edema, no-Septal dev,  polyps, erosion, perforation             Throat- Mallampati II , mucosa red , drainage- none, tonsils- atrophic Neck- flexible , trachea midline, no stridor , thyroid nl, carotid no bruit Chest - symmetrical excursion , unlabored           Heart/CV- RRR , no murmur , no gallop  , no rub, nl s1 s2                           - JVD- none , edema- none, stasis changes- none, varices- none           Lung- clear to P&A, wheeze- none, cough- + dry  , dullness-none, rub- none           Chest wall-  Abd-  Br/ Gen/ Rectal- Not done, not indicated Extrem- cyanosis- none, clubbing, none, atrophy- none, strength-  nl Neuro- grossly intact to observation

## 2011-03-15 NOTE — Assessment & Plan Note (Signed)
Building allergy vaccine per protocol without problems now. We anticipate the spring pollen season and discussed resumption of Nasonex and Singulair.

## 2011-03-15 NOTE — Assessment & Plan Note (Signed)
Discussed management of nasal congestion for better CPAP tolerance.

## 2011-03-16 ENCOUNTER — Ambulatory Visit (INDEPENDENT_AMBULATORY_CARE_PROVIDER_SITE_OTHER): Payer: BC Managed Care – PPO

## 2011-03-16 DIAGNOSIS — J309 Allergic rhinitis, unspecified: Secondary | ICD-10-CM

## 2011-03-23 ENCOUNTER — Ambulatory Visit (INDEPENDENT_AMBULATORY_CARE_PROVIDER_SITE_OTHER): Payer: BC Managed Care – PPO

## 2011-03-23 DIAGNOSIS — J309 Allergic rhinitis, unspecified: Secondary | ICD-10-CM

## 2011-03-26 ENCOUNTER — Ambulatory Visit (INDEPENDENT_AMBULATORY_CARE_PROVIDER_SITE_OTHER): Payer: BC Managed Care – PPO

## 2011-03-26 DIAGNOSIS — J309 Allergic rhinitis, unspecified: Secondary | ICD-10-CM

## 2011-03-30 ENCOUNTER — Ambulatory Visit (INDEPENDENT_AMBULATORY_CARE_PROVIDER_SITE_OTHER): Payer: BC Managed Care – PPO

## 2011-03-30 DIAGNOSIS — J309 Allergic rhinitis, unspecified: Secondary | ICD-10-CM

## 2011-04-06 ENCOUNTER — Other Ambulatory Visit: Payer: Self-pay

## 2011-04-06 ENCOUNTER — Ambulatory Visit (INDEPENDENT_AMBULATORY_CARE_PROVIDER_SITE_OTHER): Payer: BC Managed Care – PPO

## 2011-04-06 DIAGNOSIS — J309 Allergic rhinitis, unspecified: Secondary | ICD-10-CM

## 2011-04-06 MED ORDER — METHYLPHENIDATE HCL ER (OSM) 18 MG PO TBCR: 18.0000 mg | EXTENDED_RELEASE_TABLET | Freq: Every day | ORAL | Status: DC

## 2011-04-06 NOTE — Telephone Encounter (Signed)
Pt left v/m requesting written rx Methylphenidate ER 18 mg. Pt last seen 10/29/10. Please call [phone] when rx ready for pick up.

## 2011-04-06 NOTE — Telephone Encounter (Signed)
Patient advised via telephone Rx ready for pick up will be left at front desk. 

## 2011-04-06 NOTE — Telephone Encounter (Signed)
Px printed for pick up in IN box  

## 2011-04-10 ENCOUNTER — Ambulatory Visit (INDEPENDENT_AMBULATORY_CARE_PROVIDER_SITE_OTHER): Payer: BC Managed Care – PPO

## 2011-04-10 DIAGNOSIS — J309 Allergic rhinitis, unspecified: Secondary | ICD-10-CM

## 2011-04-13 ENCOUNTER — Ambulatory Visit (INDEPENDENT_AMBULATORY_CARE_PROVIDER_SITE_OTHER): Payer: BC Managed Care – PPO

## 2011-04-13 DIAGNOSIS — J309 Allergic rhinitis, unspecified: Secondary | ICD-10-CM

## 2011-04-17 ENCOUNTER — Ambulatory Visit (INDEPENDENT_AMBULATORY_CARE_PROVIDER_SITE_OTHER): Payer: BC Managed Care – PPO

## 2011-04-17 DIAGNOSIS — J309 Allergic rhinitis, unspecified: Secondary | ICD-10-CM

## 2011-04-20 ENCOUNTER — Ambulatory Visit (INDEPENDENT_AMBULATORY_CARE_PROVIDER_SITE_OTHER): Payer: BC Managed Care – PPO

## 2011-04-20 DIAGNOSIS — J309 Allergic rhinitis, unspecified: Secondary | ICD-10-CM

## 2011-04-22 ENCOUNTER — Telehealth: Payer: Self-pay

## 2011-04-22 NOTE — Telephone Encounter (Signed)
Pt said insurance did not pay for 03/25/10 visit. I gave pt [phone] to help with billing questions.

## 2011-04-23 ENCOUNTER — Ambulatory Visit (INDEPENDENT_AMBULATORY_CARE_PROVIDER_SITE_OTHER): Payer: BC Managed Care – PPO

## 2011-04-23 DIAGNOSIS — J309 Allergic rhinitis, unspecified: Secondary | ICD-10-CM

## 2011-04-24 ENCOUNTER — Encounter: Payer: Self-pay | Admitting: Internal Medicine

## 2011-04-28 ENCOUNTER — Ambulatory Visit (INDEPENDENT_AMBULATORY_CARE_PROVIDER_SITE_OTHER): Payer: BC Managed Care – PPO

## 2011-04-28 DIAGNOSIS — J309 Allergic rhinitis, unspecified: Secondary | ICD-10-CM

## 2011-05-01 ENCOUNTER — Ambulatory Visit (INDEPENDENT_AMBULATORY_CARE_PROVIDER_SITE_OTHER): Payer: BC Managed Care – PPO

## 2011-05-01 DIAGNOSIS — J309 Allergic rhinitis, unspecified: Secondary | ICD-10-CM

## 2011-05-04 ENCOUNTER — Ambulatory Visit (INDEPENDENT_AMBULATORY_CARE_PROVIDER_SITE_OTHER): Payer: BC Managed Care – PPO

## 2011-05-04 DIAGNOSIS — J309 Allergic rhinitis, unspecified: Secondary | ICD-10-CM

## 2011-05-11 ENCOUNTER — Ambulatory Visit (INDEPENDENT_AMBULATORY_CARE_PROVIDER_SITE_OTHER): Payer: BC Managed Care – PPO

## 2011-05-11 DIAGNOSIS — J309 Allergic rhinitis, unspecified: Secondary | ICD-10-CM

## 2011-05-14 ENCOUNTER — Ambulatory Visit (INDEPENDENT_AMBULATORY_CARE_PROVIDER_SITE_OTHER): Payer: BC Managed Care – PPO

## 2011-05-14 DIAGNOSIS — J309 Allergic rhinitis, unspecified: Secondary | ICD-10-CM

## 2011-05-18 ENCOUNTER — Ambulatory Visit (INDEPENDENT_AMBULATORY_CARE_PROVIDER_SITE_OTHER): Payer: BC Managed Care – PPO

## 2011-05-18 DIAGNOSIS — J309 Allergic rhinitis, unspecified: Secondary | ICD-10-CM

## 2011-05-22 ENCOUNTER — Other Ambulatory Visit: Payer: Self-pay | Admitting: *Deleted

## 2011-05-22 ENCOUNTER — Ambulatory Visit (INDEPENDENT_AMBULATORY_CARE_PROVIDER_SITE_OTHER): Payer: BC Managed Care – PPO

## 2011-05-22 DIAGNOSIS — J309 Allergic rhinitis, unspecified: Secondary | ICD-10-CM

## 2011-05-22 MED ORDER — OLMESARTAN MEDOXOMIL 40 MG PO TABS: 40.0000 mg | ORAL_TABLET | Freq: Every day | ORAL | Status: DC

## 2011-05-26 ENCOUNTER — Ambulatory Visit (INDEPENDENT_AMBULATORY_CARE_PROVIDER_SITE_OTHER): Payer: BC Managed Care – PPO

## 2011-05-26 DIAGNOSIS — J309 Allergic rhinitis, unspecified: Secondary | ICD-10-CM

## 2011-05-29 ENCOUNTER — Ambulatory Visit (INDEPENDENT_AMBULATORY_CARE_PROVIDER_SITE_OTHER): Payer: BC Managed Care – PPO

## 2011-05-29 DIAGNOSIS — J309 Allergic rhinitis, unspecified: Secondary | ICD-10-CM

## 2011-06-02 ENCOUNTER — Ambulatory Visit (INDEPENDENT_AMBULATORY_CARE_PROVIDER_SITE_OTHER): Payer: BC Managed Care – PPO

## 2011-06-02 DIAGNOSIS — J309 Allergic rhinitis, unspecified: Secondary | ICD-10-CM

## 2011-06-03 ENCOUNTER — Ambulatory Visit (INDEPENDENT_AMBULATORY_CARE_PROVIDER_SITE_OTHER): Payer: BC Managed Care – PPO

## 2011-06-03 DIAGNOSIS — J309 Allergic rhinitis, unspecified: Secondary | ICD-10-CM

## 2011-06-05 ENCOUNTER — Ambulatory Visit (INDEPENDENT_AMBULATORY_CARE_PROVIDER_SITE_OTHER): Payer: BC Managed Care – PPO

## 2011-06-05 DIAGNOSIS — J309 Allergic rhinitis, unspecified: Secondary | ICD-10-CM

## 2011-06-08 ENCOUNTER — Ambulatory Visit (INDEPENDENT_AMBULATORY_CARE_PROVIDER_SITE_OTHER): Payer: BC Managed Care – PPO

## 2011-06-08 DIAGNOSIS — J309 Allergic rhinitis, unspecified: Secondary | ICD-10-CM

## 2011-06-11 ENCOUNTER — Ambulatory Visit (INDEPENDENT_AMBULATORY_CARE_PROVIDER_SITE_OTHER): Payer: BC Managed Care – PPO

## 2011-06-11 DIAGNOSIS — J309 Allergic rhinitis, unspecified: Secondary | ICD-10-CM

## 2011-06-15 ENCOUNTER — Ambulatory Visit (INDEPENDENT_AMBULATORY_CARE_PROVIDER_SITE_OTHER): Payer: BC Managed Care – PPO

## 2011-06-15 DIAGNOSIS — J309 Allergic rhinitis, unspecified: Secondary | ICD-10-CM

## 2011-06-16 ENCOUNTER — Encounter: Payer: Self-pay | Admitting: Internal Medicine

## 2011-06-18 ENCOUNTER — Other Ambulatory Visit: Payer: Self-pay

## 2011-06-18 NOTE — Telephone Encounter (Signed)
Pt request rx methylphenidate.call when ready for pick up and may leave message if no answer. Pt wants to wait for Dr Milinda Antis to write rx. Does not need until end of next week.

## 2011-06-22 ENCOUNTER — Ambulatory Visit (INDEPENDENT_AMBULATORY_CARE_PROVIDER_SITE_OTHER): Payer: BC Managed Care – PPO

## 2011-06-22 DIAGNOSIS — J309 Allergic rhinitis, unspecified: Secondary | ICD-10-CM

## 2011-06-22 MED ORDER — METHYLPHENIDATE HCL ER (OSM) 18 MG PO TBCR: 18.0000 mg | EXTENDED_RELEASE_TABLET | Freq: Every day | ORAL | Status: DC

## 2011-06-22 NOTE — Telephone Encounter (Signed)
Patient informed; Rx ready for p/u Mon-Fri 8a-5p/SLS  

## 2011-06-22 NOTE — Telephone Encounter (Signed)
Px printed for pick up in IN box  

## 2011-06-29 ENCOUNTER — Ambulatory Visit (INDEPENDENT_AMBULATORY_CARE_PROVIDER_SITE_OTHER): Payer: BC Managed Care – PPO

## 2011-06-29 DIAGNOSIS — J309 Allergic rhinitis, unspecified: Secondary | ICD-10-CM

## 2011-07-06 ENCOUNTER — Telehealth: Payer: Self-pay | Admitting: *Deleted

## 2011-07-06 ENCOUNTER — Ambulatory Visit (INDEPENDENT_AMBULATORY_CARE_PROVIDER_SITE_OTHER): Payer: BC Managed Care – PPO

## 2011-07-06 DIAGNOSIS — J309 Allergic rhinitis, unspecified: Secondary | ICD-10-CM

## 2011-07-06 NOTE — Telephone Encounter (Signed)
I think options for generic meds of that class (ARBs) would be cozaar (losartan) and possibly diovan  Have her check with her insurance to see which generic ARB is preferred and let me know-thanks If we change it will have to schedule f/u to see if it is working

## 2011-07-06 NOTE — Telephone Encounter (Signed)
Patient called stating that she is on Benicar for her BP and wants to know if you can change this to a generic medication since Benicar does not come generic? Please advise.

## 2011-07-07 NOTE — Telephone Encounter (Signed)
Called in Rx for Tessalon 200 mg #30 with 0 refills to [facility].

## 2011-07-13 ENCOUNTER — Encounter: Payer: Self-pay | Admitting: Internal Medicine

## 2011-07-13 ENCOUNTER — Ambulatory Visit (INDEPENDENT_AMBULATORY_CARE_PROVIDER_SITE_OTHER): Payer: BC Managed Care – PPO

## 2011-07-13 ENCOUNTER — Ambulatory Visit (INDEPENDENT_AMBULATORY_CARE_PROVIDER_SITE_OTHER): Payer: BC Managed Care – PPO | Admitting: Internal Medicine

## 2011-07-13 VITALS — BP 124/88 | HR 114 | Ht 68.0 in | Wt 190.2 lb

## 2011-07-13 DIAGNOSIS — J309 Allergic rhinitis, unspecified: Secondary | ICD-10-CM

## 2011-07-13 DIAGNOSIS — G4733 Obstructive sleep apnea (adult) (pediatric): Secondary | ICD-10-CM

## 2011-07-13 MED ORDER — ALBUTEROL SULFATE HFA 108 (90 BASE) MCG/ACT IN AERS: 2.0000 | INHALATION_SPRAY | RESPIRATORY_TRACT | Status: DC | PRN

## 2011-07-13 NOTE — Progress Notes (Signed)
Patient ID: [name], female    DOB: 09/14/1965, 46 y.o.   MRN: [id number]  HPI 61 yoF followed for obstructive sleep apnea and allergic rhinitis, complicated by insomnia, GERD, depression. Last here March 9 when we gave depo shot which made her much better. She was able to go back to full time use of her CPAP machine - 11 cwp Advanced. We discussed her meds. She feels well today.   11/06/10- 44 yoF followed for obstructive sleep apnea and allergic rhinitis, complicated by insomnia, GERD, depression. Has had flu vaccine. Had sustained head congestion and productive cough one month ago. Her primary physician treated Augmentin and cough syrup. She was clear after that until 4 days ago. One day after her flu shot, she woke with scratchy throat, no fever, increased head congestion.Unable to use CPAP while her head is congested.her ophthalmologist has asked her to get allergy testing done.  Years ago, allergy testing was positive and she was on allergy vaccine for a while. She does not remember details or response.  01/07/11-  44 yoF followed for obstructive sleep apnea and allergic rhinitis, complicated by insomnia, GERD, depression. Comes today for allergy skin testing off antihistamines. No recent respiratory infections. Wheeze very occasionally with exertion.  Anticipates seasonal flare as February pollens start. Had flu vaccine. Uses CPAP regularly "as long as I can breathe". Allergy Skin tests- significant positive reactions for numerous, and inhalant allergens, particularly grass weed and tree pollens, dust mite, cat and some molds. She wishes to start allergy vaccine.  03/12/11-  44 yoF followed for obstructive sleep apnea and allergic rhinitis, complicated by insomnia, GERD, depression. Wearing CPAP less consistently in the last 2 weeks because of nasal congestion. Building allergy vaccine without problems. Taking Allegra-D plus Sudafed. We discussed this. Using saline nasal rinse. Continues  Advair 250. Using rescue inhaler about twice a week.  07/13/11- 44 yoF followed for obstructive sleep apnea and allergic rhinitis, complicated by insomnia, GERD, depression. Having reactions in left arm only. We are holding her allergy vaccine at 1:50/0.4 mL. CPAP 11/Advanced-working very well for her. She describes good compliance and control with no problems.  Review of Systems- see HPI Constitutional:   No-   weight loss, night sweats, fevers, chills, fatigue, lassitude. HEENT:   No-  headaches, difficulty swallowing, tooth/dental problems, sore throat,       +  sneezing, itching, ear ache, nasal congestion, post nasal drip,  CV:  No-   chest pain, orthopnea, PND, swelling in lower extremities, anasarca, dizziness, palpitations Resp: No-   shortness of breath with exertion or at rest.              No-   productive cough,  Occasional non-productive cough,  No- coughing up of blood.              No-   change in color of mucus.  No- wheezing.   Skin: No-   rash or lesions. GI:  No-   heartburn, indigestion, abdominal pain, nausea, vomiting,  GU: MS:   Neuro-     nothing unusual Psych:  No- change in mood or affect. No depression or anxiety.  No memory loss.  Objective:   Physical Exam General- Alert, Oriented, Affect-appropriate, Distress- none acute Skin- rash-none, lesions- none, excoriation- none Lymphadenopathy- none Head- atraumatic            Eyes- Gross vision intact, PERRLA, conjunctivae clear secretions  Ears- Hearing, canals-normal            Nose- mucus bridging, turinate edema, no-Septal dev, polyps, erosion, perforation             Throat- Mallampati II-III , mucosa clear , drainage- none, tonsils- atrophic Neck- flexible , trachea midline, no stridor , thyroid nl, carotid no bruit Chest - symmetrical excursion , unlabored           Heart/CV- RRR , no murmur , no gallop  , no rub, nl s1 s2                           - JVD- none , edema- none, stasis changes-  none, varices- none           Lung- clear to P&A, wheeze- none, cough- none  , dullness-none, rub- none           Chest wall-  Abd-  Br/ Gen/ Rectal- Not done, not indicated Extrem- cyanosis- none, clubbing, none, atrophy- none, strength- nl Neuro- grossly intact to observation

## 2011-07-13 NOTE — Patient Instructions (Addendum)
I will ask the allergy lab to reduce your injection volume to 0.24ml. They may also use other sites to avoid the left arm if needed.   Script for albuterol/ [employer] rescue inhaler

## 2011-07-17 ENCOUNTER — Other Ambulatory Visit: Payer: Self-pay | Admitting: Family Medicine

## 2011-07-17 NOTE — Telephone Encounter (Signed)
Ok to refill? Last OV was 10/29/10

## 2011-07-17 NOTE — Telephone Encounter (Signed)
Will refill electronically  

## 2011-07-19 ENCOUNTER — Encounter: Payer: Self-pay | Admitting: Internal Medicine

## 2011-07-19 NOTE — Assessment & Plan Note (Signed)
Because of persistent local reactions in left arm, I suggested rotation of the injection sites including use of buttock if needed. Plan-reduced volumes to 0.3 mL of 1:50

## 2011-07-19 NOTE — Assessment & Plan Note (Signed)
CPAP 11/Advanced. Good compliance and control. Comfort issues discussed.

## 2011-07-20 ENCOUNTER — Telehealth: Payer: Self-pay | Admitting: Family Medicine

## 2011-07-20 ENCOUNTER — Encounter: Payer: Self-pay | Admitting: Family Medicine

## 2011-07-20 ENCOUNTER — Ambulatory Visit (INDEPENDENT_AMBULATORY_CARE_PROVIDER_SITE_OTHER): Payer: BC Managed Care – PPO | Admitting: Family Medicine

## 2011-07-20 VITALS — BP 116/62 | HR 104 | Temp 98.4°F | Ht 68.0 in | Wt 186.2 lb

## 2011-07-20 DIAGNOSIS — Z23 Encounter for immunization: Secondary | ICD-10-CM

## 2011-07-20 DIAGNOSIS — S81819A Laceration without foreign body, unspecified lower leg, initial encounter: Secondary | ICD-10-CM

## 2011-07-20 DIAGNOSIS — S81009A Unspecified open wound, unspecified knee, initial encounter: Secondary | ICD-10-CM

## 2011-07-20 NOTE — Patient Instructions (Addendum)
Keep wound clean with antibacterial soap and water  Use antibiotic ointment and loose band aid until healed  If any redness/ pus/ swelling or fever let me know  Tdap

## 2011-07-20 NOTE — Telephone Encounter (Signed)
Will see her then 

## 2011-07-20 NOTE — Progress Notes (Signed)
Subjective:    Patient ID: [name], female    DOB: 1965/03/07, 46 y.o.   MRN: [id number]  HPI Here with a wound - ran into the corner of a trailer - last night , and has a puncture wound  It did bleed a fair amount , still oozing a little bit  Not a lot of pain Nothing broke off in the wound  Not rusty   Used soap and water to clean  Dressed with neosporin  No hx of mrsa in the past    Otherwise is doing well   Has lost a little wt - is very busy  Getting ready to move   Patient Active Problem List  Diagnosis  . DEPRESSION  . OBSTRUCTIVE SLEEP APNEA  . HYPERTENSION  . ALLERGIC RHINITIS  . ASTHMA  . GERD  . IBS  . ENDOMETRIOSIS  . FEMALE INFERTILITY  . DYSHIDROTIC ECZEMA, HANDS  . BACK PAIN  . FIBROMYALGIA  . INSOMNIA  . ADD (attention deficit disorder)   Past Medical History  Diagnosis Date  . Allergy     allergic rhinitis  . Asthma   . Depression   . GERD (gastroesophageal reflux disease)   . Hypertension   . Fibromyalgia   . Degenerative disc disease   . Sleep apnea   . Fx of fibula 2009  . IBS (irritable bowel syndrome)   . Chronic headaches   . Hemorrhoids    Past Surgical History  Procedure Date  . Laparoscopy 08/2004    endometriosis - several   . Breast surgery     left nipple removed - benign tumor  . Anterior cruciate ligament repair 1996    small tear of ACL  . Bunionectomy   . Tonsillectomy   . Abdominal hysterectomy    History  Substance Use Topics  . Smoking status: Never Smoker   . Smokeless tobacco: Never Used  . Alcohol Use: No   Family History  Problem Relation Age of Onset  . Hypertension Mother   . Thyroid disease Mother   . Fibromyalgia Mother   . Diabetes Father   . Kidney cancer Maternal Grandmother   . Colon cancer Neg Hx   . Colon polyps Mother    Allergies  Allergen Reactions  . Cetirizine Hcl     REACTION: not effective  . Erythromycin     REACTION: stomach hurts  . Escitalopram Oxalate    REACTION: no improvement  . Lisinopril     REACTION: cough  . Loratadine     REACTION: not effective  . Nifedipine     REACTION: fatigued and tremor  . Sertraline Hcl     REACTION: no help  . Vyvanse (Lisdexamfetamine Dimesylate)     Headache and excitablility   Current Outpatient Prescriptions on File Prior to Visit  Medication Sig Dispense Refill  . acetaminophen (TYLENOL) 500 MG tablet OTC as directed.       Marland Kitchen albuterol (PROAIR HFA) 108 (90 BASE) MCG/ACT inhaler Inhale 2 puffs into the lungs every 4 (four) hours as needed for wheezing. for wheezing.  3 Inhaler  3  . buPROPion (WELLBUTRIN XL) 300 MG 24 hr tablet TAKE 1 TABLET BY MOUTH EVERY MORNING  30 tablet  11  . diphenhydrAMINE (BENADRYL) 25 mg capsule OTC as directed.       . Fluticasone-Salmeterol (ADVAIR DISKUS) 250-50 MCG/DOSE AEPB Rinse mouth. Use 1 puff, twice daily  60 each  prn  . ibuprofen (ADVIL,MOTRIN) 200 MG tablet Take  400 mg by mouth every 6 (six) hours as needed.        . methocarbamol (ROBAXIN) 500 MG tablet Take 500 mg by mouth at bedtime as needed.       . methylphenidate (CONCERTA) 18 MG CR tablet Take 1 tablet (18 mg total) by mouth daily.  30 tablet  0  . mometasone (NASONEX) 50 MCG/ACT nasal spray Place 2 sprays into the nose daily. 1-2 puffs each nostril once daily.  17 g  prn  . montelukast (SINGULAIR) 10 MG tablet 1 by mouth once daily for asthma and allergy symptoms.  90 tablet  3  . Naproxen Sodium (ALEVE) 220 MG CAPS Take by mouth.      . olmesartan (BENICAR) 40 MG tablet Take 1 tablet (40 mg total) by mouth daily.  30 tablet  6  . Olopatadine HCl (PATADAY) 0.2 % SOLN Place 1 drop into both eyes at bedtime.        . Azelastine HCl (ASTEPRO) 0.15 % SOLN 1-2 sprays in each nostril two times a day as needed.       . fexofenadine-pseudoephedrine (ALLEGRA-D 24) 180-240 MG per 24 hr tablet OTC as directed.       . Pseudoeph-Doxylamine-DM-APAP (NYQUIL PO) Take by mouth as directed.           Review of  Systems    Review of Systems  Constitutional: Negative for fever, appetite change, fatigue and unexpected weight change.  Eyes: Negative for pain and visual disturbance.  Respiratory: Negative for cough and shortness of breath.   Cardiovascular: Negative for cp or palpitations    Gastrointestinal: Negative for nausea, diarrhea and constipation.  Genitourinary: Negative for urgency and frequency.  Skin: Negative for pallor or rash  pos for wound Neurological: Negative for weakness, light-headedness, numbness and headaches.  Hematological: Negative for adenopathy. Does not bruise/bleed easily.  Psychiatric/Behavioral: Negative for dysphoric mood. The patient is not nervous/anxious.      Objective:   Physical Exam  Constitutional: She appears well-developed and well-nourished.  HENT:  Head: Normocephalic and atraumatic.  Eyes: Conjunctivae and EOM are normal. Pupils are equal, round, and reactive to light.  Cardiovascular: Normal rate, regular rhythm and intact distal pulses.   Musculoskeletal: She exhibits no tenderness.  Skin: Skin is warm and dry. No rash noted. No pallor.       2-3 cm "v" shaped skin tear on R lower leg without bleeding/ oozing/ redness or swelling  No bony tenderness of leg  Psychiatric: She has a normal mood and affect.          Assessment & Plan:

## 2011-07-20 NOTE — Assessment & Plan Note (Signed)
"  V" shaped skin tear on R lower leg/ shin No s/s infection  Cleaned and dressed with triple abx and band aid Disc care until healed- see AVS Tdap today

## 2011-07-20 NOTE — Telephone Encounter (Signed)
Triage Record Num: [id number] Operator: Laren Boom Patient Name: [name] Call Date & Time: 07/19/2011 8:09:05PM Patient Phone: [phone] PCP: Audrie Gallus. Tower Patient Gender: Female PCP Fax : Patient DOB: February 03, 1965 Practice Name: [facility] Reason for Call: Caller: [name]; PCP: Roxy Manns A.; CB#: [phone]; 07/19/11 - Walked into the corner of trailer that carries lawn mowers behind a truck. Right leg - Puncture Wound 2-3 inches below the knee on the front of her leg. 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 Patient thinks it may be more than 10 years since last Tetanus Vaccine and EPIC chart does not have record of Tetanus Vaccine. All Emergent S/S of Abrasions, Lacerations, Puncture Wound Protocol r/o except " tetanus immunization not up to date". Scheduled an appointment for 07/20/11 @ 12:00 pm with Dr Milinda Antis. Home Care Advice Given. Protocol(s) Used: Abrasions, Lacerations, Puncture Wounds Recommended Outcome per Protocol: See Provider within 24 hours Reason for Outcome: Tetanus immunization not up to date Care Advice: Call provider if wound or area around wound becomes increasingly red or painful, there is a purulent or foul smelling discharge, red streaks develop leading away from wound, or if you develop any temperature elevation. ~ ~ Thoroughly wash hands with soap and water before and after touching the site. Tetanus immunization must be given as soon as possible after injury, usually within 72 hours, IF: - immunization status is unknown, never immunized, or fewer than 3 doses given, - it has been 10 years or more since last immunization; - OR if it has been 5 years or more since last booster, AND wound is a deep, is a puncture wound, or is a tetanus-prone wound. Keep an up-to-date record of your immunizations so unnecessary repeat doses are not given. ~ 07/19/2011 8:53:41PM Page 1 of 1 [email or web address]

## 2011-07-21 ENCOUNTER — Ambulatory Visit (INDEPENDENT_AMBULATORY_CARE_PROVIDER_SITE_OTHER): Payer: BC Managed Care – PPO

## 2011-07-21 DIAGNOSIS — J309 Allergic rhinitis, unspecified: Secondary | ICD-10-CM

## 2011-07-28 ENCOUNTER — Ambulatory Visit (INDEPENDENT_AMBULATORY_CARE_PROVIDER_SITE_OTHER): Payer: BC Managed Care – PPO

## 2011-07-28 DIAGNOSIS — J309 Allergic rhinitis, unspecified: Secondary | ICD-10-CM

## 2011-08-07 ENCOUNTER — Ambulatory Visit (INDEPENDENT_AMBULATORY_CARE_PROVIDER_SITE_OTHER): Payer: BC Managed Care – PPO

## 2011-08-07 DIAGNOSIS — J309 Allergic rhinitis, unspecified: Secondary | ICD-10-CM

## 2011-08-13 ENCOUNTER — Telehealth: Payer: Self-pay | Admitting: *Deleted

## 2011-08-13 ENCOUNTER — Ambulatory Visit (INDEPENDENT_AMBULATORY_CARE_PROVIDER_SITE_OTHER): Payer: BC Managed Care – PPO

## 2011-08-13 DIAGNOSIS — J309 Allergic rhinitis, unspecified: Secondary | ICD-10-CM

## 2011-08-13 NOTE — Telephone Encounter (Signed)
Prior Authorization faxed to [phone] 

## 2011-08-13 NOTE — Telephone Encounter (Signed)
Verify that with her and please send prior auth back with a note to that effect, thanks

## 2011-08-13 NOTE — Telephone Encounter (Signed)
Prior auth form for adderall is on your shelf.  I don't think patient is taking this anymore, looks like she's taking concerta.

## 2011-08-18 ENCOUNTER — Ambulatory Visit (INDEPENDENT_AMBULATORY_CARE_PROVIDER_SITE_OTHER): Payer: BC Managed Care – PPO

## 2011-08-18 DIAGNOSIS — J309 Allergic rhinitis, unspecified: Secondary | ICD-10-CM

## 2011-08-20 ENCOUNTER — Telehealth: Payer: Self-pay

## 2011-08-20 NOTE — Telephone Encounter (Signed)
Prior auth needed for concerta;form is on Dr Royden Purl shelf.

## 2011-08-21 NOTE — Telephone Encounter (Signed)
Faxed to [phone]

## 2011-08-21 NOTE — Telephone Encounter (Signed)
Done and in IN box 

## 2011-08-24 ENCOUNTER — Ambulatory Visit (INDEPENDENT_AMBULATORY_CARE_PROVIDER_SITE_OTHER): Payer: BC Managed Care – PPO

## 2011-08-24 DIAGNOSIS — J309 Allergic rhinitis, unspecified: Secondary | ICD-10-CM

## 2011-09-01 ENCOUNTER — Ambulatory Visit (INDEPENDENT_AMBULATORY_CARE_PROVIDER_SITE_OTHER): Payer: BC Managed Care – PPO

## 2011-09-01 DIAGNOSIS — J309 Allergic rhinitis, unspecified: Secondary | ICD-10-CM

## 2011-09-11 ENCOUNTER — Ambulatory Visit (INDEPENDENT_AMBULATORY_CARE_PROVIDER_SITE_OTHER): Payer: BC Managed Care – PPO

## 2011-09-11 DIAGNOSIS — J309 Allergic rhinitis, unspecified: Secondary | ICD-10-CM

## 2011-09-14 ENCOUNTER — Ambulatory Visit (INDEPENDENT_AMBULATORY_CARE_PROVIDER_SITE_OTHER): Payer: BC Managed Care – PPO

## 2011-09-14 DIAGNOSIS — J309 Allergic rhinitis, unspecified: Secondary | ICD-10-CM

## 2011-09-22 ENCOUNTER — Encounter: Payer: Self-pay | Admitting: Internal Medicine

## 2011-09-29 ENCOUNTER — Ambulatory Visit (INDEPENDENT_AMBULATORY_CARE_PROVIDER_SITE_OTHER): Payer: BC Managed Care – PPO

## 2011-09-29 DIAGNOSIS — J309 Allergic rhinitis, unspecified: Secondary | ICD-10-CM

## 2011-10-02 ENCOUNTER — Ambulatory Visit (INDEPENDENT_AMBULATORY_CARE_PROVIDER_SITE_OTHER): Payer: BC Managed Care – PPO | Admitting: Family Medicine

## 2011-10-02 ENCOUNTER — Encounter: Payer: Self-pay | Admitting: Family Medicine

## 2011-10-02 VITALS — BP 132/96 | HR 126 | Temp 98.6°F | Ht 67.5 in | Wt 186.5 lb

## 2011-10-02 DIAGNOSIS — H20029 Recurrent acute iridocyclitis, unspecified eye: Secondary | ICD-10-CM | POA: Insufficient documentation

## 2011-10-02 DIAGNOSIS — Z1231 Encounter for screening mammogram for malignant neoplasm of breast: Secondary | ICD-10-CM | POA: Insufficient documentation

## 2011-10-02 DIAGNOSIS — Z23 Encounter for immunization: Secondary | ICD-10-CM

## 2011-10-02 DIAGNOSIS — I10 Essential (primary) hypertension: Secondary | ICD-10-CM

## 2011-10-02 DIAGNOSIS — R000 Tachycardia, unspecified: Secondary | ICD-10-CM | POA: Insufficient documentation

## 2011-10-02 DIAGNOSIS — Z0000 Encounter for general adult medical examination without abnormal findings: Secondary | ICD-10-CM | POA: Insufficient documentation

## 2011-10-02 LAB — CBC WITH DIFFERENTIAL/PLATELET
Basophils Relative: 1 % (ref 0–1)
Eosinophils Absolute: 0.3 10*3/uL (ref 0.0–0.7)
Hemoglobin: 15.2 g/dL — ABNORMAL HIGH (ref 12.0–15.0)
MCH: 28.4 pg (ref 26.0–34.0)
MCHC: 33.9 g/dL (ref 30.0–36.0)
Monocytes Absolute: 0.6 10*3/uL (ref 0.1–1.0)
Monocytes Relative: 7 % (ref 3–12)
Neutrophils Relative %: 55 % (ref 43–77)

## 2011-10-02 LAB — COMPREHENSIVE METABOLIC PANEL
Alkaline Phosphatase: 92 U/L (ref 39–117)
BUN: 10 mg/dL (ref 6–23)
Glucose, Bld: 108 mg/dL — ABNORMAL HIGH (ref 70–99)
Sodium: 139 mEq/L (ref 135–145)
Total Bilirubin: 0.4 mg/dL (ref 0.3–1.2)

## 2011-10-02 LAB — LIPID PANEL
Cholesterol: 197 mg/dL (ref 0–200)
HDL: 56 mg/dL (ref >39)
Total CHOL/HDL Ratio: 3.5 Ratio
Triglycerides: 139 mg/dL (ref <150)
VLDL: 28 mg/dL (ref 0–40)

## 2011-10-02 MED ORDER — TRAMADOL HCL 50 MG PO TABS: 50.0000 mg | ORAL_TABLET | Freq: Three times a day (TID) | ORAL | Status: DC | PRN

## 2011-10-02 MED ORDER — METHOCARBAMOL 500 MG PO TABS: 500.0000 mg | ORAL_TABLET | Freq: Every evening | ORAL | Status: DC | PRN

## 2011-10-02 NOTE — Patient Instructions (Addendum)
Labs today  We will schedule mammogram at check out You had a flu shot today  Your blood pressure and pulse are up- I need to find out why Labs for iritis today also  Follow up in 1-2 weeks please Hold off on allegraD (you can have plain allegra/ claritin/ zyrtec but no D

## 2011-10-02 NOTE — Progress Notes (Signed)
Subjective:    Patient ID: [name], female    DOB: 03-23-1965, 46 y.o.   MRN: [id number]  HPI Here for health maintenance exam and to review chronic medical problems   Also has had 2 episodes of iritis  Both eyes Sees Dr Manning Charity - and he is interested in work up for autoimmune problem  Holding pataday now  On pred forte 1%-- is weaning off that (has appt tues) Not on any oral steroids  At no point thought it was infection  There was ? Of allergies causing it  Wants to check ESR and RF and ANA , C reactive protein  Not having headache   A little vision change in L eye -- a little blurry - px has changed Wearing glasses  Holding off contacts right now  No hx of cataracts   Wt is stable   bp is up a bit today and pulse  High at home also 154/100, 153/109-- that is new  Pulse has been 70s-80s , occ 90s   Had a hysterectomy - endometriosis Left one ovary to prevent early menopause  Does not have a cervix  2008 was surgery Feb 2012 was last pap  All paps neg  No new partners   [facility] [address] march of 2012 Many years ago - breast surgery - removed nipple- benign tumor   Patient Active Problem List  Diagnosis  . DEPRESSION  . OBSTRUCTIVE SLEEP APNEA  . HYPERTENSION  . ALLERGIC RHINITIS  . ASTHMA  . GERD  . IBS  . ENDOMETRIOSIS  . FEMALE INFERTILITY  . DYSHIDROTIC ECZEMA, HANDS  . BACK PAIN  . FIBROMYALGIA  . INSOMNIA  . ADD (attention deficit disorder)  . Skin tear of lower leg without complication  . Routine general medical examination at a health care facility  . Iritis, recurrent  . Tachycardia  . Other screening mammogram   Past Medical History  Diagnosis Date  . Allergy     allergic rhinitis  . Asthma   . Depression   . GERD (gastroesophageal reflux disease)   . Hypertension   . Fibromyalgia   . Degenerative disc disease   . Sleep apnea   . Fx of fibula 2009  . IBS (irritable bowel syndrome)   . Chronic  headaches   . Hemorrhoids    Past Surgical History  Procedure Date  . Laparoscopy 08/2004    endometriosis - several   . Breast surgery     left nipple removed - benign tumor  . Anterior cruciate ligament repair 1996    small tear of ACL  . Bunionectomy   . Tonsillectomy   . Abdominal hysterectomy    History  Substance Use Topics  . Smoking status: Never Smoker   . Smokeless tobacco: Never Used  . Alcohol Use: No   Family History  Problem Relation Age of Onset  . Hypertension Mother   . Thyroid disease Mother   . Fibromyalgia Mother   . Diabetes Father   . Kidney cancer Maternal Grandmother   . Colon cancer Neg Hx   . Colon polyps Mother    Allergies  Allergen Reactions  . Cetirizine Hcl     REACTION: not effective  . Erythromycin     REACTION: stomach hurts  . Escitalopram Oxalate     REACTION: no improvement  . Lisinopril     REACTION: cough  . Loratadine     REACTION: not effective  . Nifedipine  REACTION: fatigued and tremor  . Sertraline Hcl     REACTION: no help  . Vyvanse (Lisdexamfetamine Dimesylate)     Headache and excitablility   Current Outpatient Prescriptions on File Prior to Visit  Medication Sig Dispense Refill  . acetaminophen (TYLENOL) 500 MG tablet OTC as directed.       Marland Kitchen albuterol (PROAIR HFA) 108 (90 BASE) MCG/ACT inhaler Inhale 2 puffs into the lungs every 4 (four) hours as needed for wheezing. for wheezing.  3 Inhaler  3  . buPROPion (WELLBUTRIN XL) 300 MG 24 hr tablet TAKE 1 TABLET BY MOUTH EVERY MORNING  30 tablet  11  . diphenhydrAMINE (BENADRYL) 25 mg capsule OTC as directed.       . methylphenidate (CONCERTA) 18 MG CR tablet Take 1 tablet (18 mg total) by mouth daily.  30 tablet  0  . montelukast (SINGULAIR) 10 MG tablet 1 by mouth once daily for asthma and allergy symptoms.  90 tablet  3  . olmesartan (BENICAR) 40 MG tablet Take 1 tablet (40 mg total) by mouth daily.  30 tablet  6  . Pseudoeph-Doxylamine-DM-APAP (NYQUIL PO)  Take by mouth as directed.        . Fluticasone-Salmeterol (ADVAIR DISKUS) 250-50 MCG/DOSE AEPB Rinse mouth. Use 1 puff, twice daily  60 each  prn  . Olopatadine HCl (PATADAY) 0.2 % SOLN Place 1 drop into both eyes at bedtime.               Review of Systems Review of Systems  Constitutional: Negative for fever, appetite change, fatigue and unexpected weight change.  Eyes: Negative for pain and visual disturbance. (this is resolved today) Respiratory: Negative for cough and shortness of breath.   Cardiovascular: Negative for cp or palpitations   pos for fast heart rate at times  Gastrointestinal: Negative for nausea, diarrhea and constipation.  Genitourinary: Negative for urgency and frequency.  Skin: Negative for pallor or rash   MSK neg for swelling or tenderness in legs Neurological: Negative for weakness, light-headedness, numbness and headaches.  Hematological: Negative for adenopathy. Does not bruise/bleed easily.  Psychiatric/Behavioral: Negative for dysphoric mood. The patient is not nervous/anxious.         Objective:   Physical Exam  Constitutional: She appears well-developed and well-nourished. No distress.       overwt and well appearing   HENT:  Head: Normocephalic and atraumatic.  Mouth/Throat: Oropharynx is clear and moist.  Eyes: Conjunctivae normal and EOM are normal. Pupils are equal, round, and reactive to light. Right eye exhibits no discharge. Left eye exhibits no discharge. No scleral icterus.  Neck: Normal range of motion. Neck supple. No JVD present. No thyromegaly present.  Cardiovascular: Normal rate, regular rhythm, normal heart sounds and intact distal pulses.  Exam reveals no gallop.   Pulmonary/Chest: Effort normal and breath sounds normal. No respiratory distress. She has no wheezes.  Abdominal: Soft. Bowel sounds are normal. She exhibits no distension and no mass. There is no tenderness.  Genitourinary: No breast swelling, tenderness, discharge or  bleeding.       Breast exam: No mass, nodules, thickening, tenderness, bulging, retraction, inflamation, nipple discharge or skin changes noted.  No axillary or clavicular LA.  Chaperoned exam.    Musculoskeletal: Normal range of motion. She exhibits no edema and no tenderness.       No palp cords or tenderness of legs  Lymphadenopathy:    She has no cervical adenopathy.  Neurological: She is alert. She has  normal reflexes. No cranial nerve deficit. She exhibits normal muscle tone. Coordination normal.  Skin: Skin is warm and dry. No rash noted. No erythema. No pallor.  Psychiatric: She has a normal mood and affect.          Assessment & Plan:

## 2011-10-04 ENCOUNTER — Telehealth: Payer: Self-pay | Admitting: Family Medicine

## 2011-10-04 MED ORDER — CARVEDILOL 6.25 MG PO TABS: 6.2500 mg | ORAL_TABLET | Freq: Two times a day (BID) | ORAL | Status: DC

## 2011-10-04 NOTE — Assessment & Plan Note (Signed)
Scheduled annual screening mammogram Nl breast exam today  Encouraged monthly self exams   

## 2011-10-04 NOTE — Assessment & Plan Note (Signed)
Several episodes-now resolved again on steroid drops Seeing Dr Emily Filbert  Lab today for poss autoimmune cause

## 2011-10-04 NOTE — Assessment & Plan Note (Signed)
bp is up as is pulse Sinus tachycardia on EKG Labs pending  May need to add beta blocker if possible- will wait on labs first  Short f/u scheduled

## 2011-10-04 NOTE — Assessment & Plan Note (Signed)
EKG reviewed  Also bp up  Pt is asymptomatic  Rev meds- adv to avoid decongestants  Lab today and rev May need beta blocker Need to r/o organic cause first

## 2011-10-04 NOTE — Telephone Encounter (Signed)
Pease let pt know that I rev lab-no reason for high bp or heart rate Please call in carvedilol-get started on that and will disc further at f/u If any side eff from this like wheezing- let me know  Labs for iritis all nl so far- ana pending , will send to Dr Emily Filbert when it returns

## 2011-10-04 NOTE — Assessment & Plan Note (Signed)
Reviewed health habits including diet and exercise and skin cancer prevention Also reviewed health mt list, fam hx and immunizations  Wellness labs today- will disc at follow up appt  Mam scheduled

## 2011-10-06 ENCOUNTER — Ambulatory Visit (INDEPENDENT_AMBULATORY_CARE_PROVIDER_SITE_OTHER): Payer: BC Managed Care – PPO

## 2011-10-06 DIAGNOSIS — J309 Allergic rhinitis, unspecified: Secondary | ICD-10-CM

## 2011-10-06 NOTE — Telephone Encounter (Signed)
Notified pt of new med. And if she has side eff call us if not we will discuss futher at next f/u, called in Rx as prescribed

## 2011-10-12 ENCOUNTER — Ambulatory Visit (INDEPENDENT_AMBULATORY_CARE_PROVIDER_SITE_OTHER): Payer: BC Managed Care – PPO | Admitting: Family Medicine

## 2011-10-12 ENCOUNTER — Encounter: Payer: Self-pay | Admitting: Family Medicine

## 2011-10-12 VITALS — BP 128/85 | HR 82 | Temp 98.6°F | Ht 67.5 in | Wt 189.5 lb

## 2011-10-12 DIAGNOSIS — I10 Essential (primary) hypertension: Secondary | ICD-10-CM

## 2011-10-12 MED ORDER — CARVEDILOL 6.25 MG PO TABS: 6.2500 mg | ORAL_TABLET | Freq: Two times a day (BID) | ORAL | Status: DC

## 2011-10-12 MED ORDER — METHYLPHENIDATE HCL ER (OSM) 18 MG PO TBCR: 18.0000 mg | EXTENDED_RELEASE_TABLET | ORAL | Status: DC
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
## 2011-10-12 NOTE — Assessment & Plan Note (Signed)
Improved with coreg- also nl pulse Will go back on her concerta and monitor No side effects  F/u 41mo if no problems Rev labs Rev lifestyle habits and changes to make with diet and exercise

## 2011-10-12 NOTE — Patient Instructions (Addendum)
Keep checking your blood pressure  Labs are reassuring  Continue current medicines- if side effects or problems , call  Start back on the concerta - same dose (if bp or pulse go up let me know) Follow up in 6 months

## 2011-10-13 ENCOUNTER — Ambulatory Visit (INDEPENDENT_AMBULATORY_CARE_PROVIDER_SITE_OTHER): Payer: BC Managed Care – PPO | Admitting: Internal Medicine

## 2011-10-13 ENCOUNTER — Ambulatory Visit (INDEPENDENT_AMBULATORY_CARE_PROVIDER_SITE_OTHER): Payer: BC Managed Care – PPO

## 2011-10-13 ENCOUNTER — Encounter: Payer: Self-pay | Admitting: Internal Medicine

## 2011-10-13 VITALS — BP 140/100 | HR 90 | Ht 67.0 in | Wt 188.0 lb

## 2011-10-13 DIAGNOSIS — J309 Allergic rhinitis, unspecified: Secondary | ICD-10-CM

## 2011-10-13 DIAGNOSIS — G4733 Obstructive sleep apnea (adult) (pediatric): Secondary | ICD-10-CM

## 2011-10-13 DIAGNOSIS — J45909 Unspecified asthma, uncomplicated: Secondary | ICD-10-CM

## 2011-10-13 NOTE — Patient Instructions (Addendum)
We can continue allergy vaccine  Add sample nasonex  Nasal spray    2 puffs each nostril once daily at bedtime  Please call as needed

## 2011-10-13 NOTE — Progress Notes (Signed)
Patient ID: [name], female    DOB: 02/21/1965, 46 y.o.   MRN: [id number]  HPI 3 yoF followed for obstructive sleep apnea and allergic rhinitis, complicated by insomnia, GERD, depression. Last here March 9 when we gave depo shot which made her much better. She was able to go back to full time use of her CPAP machine - 11 cwp Advanced. We discussed her meds. She feels well today.   11/06/10- 44 yoF followed for obstructive sleep apnea and allergic rhinitis, complicated by insomnia, GERD, depression. Has had flu vaccine. Had sustained head congestion and productive cough one month ago. Her primary physician treated Augmentin and cough syrup. She was clear after that until 4 days ago. One day after her flu shot, she woke with scratchy throat, no fever, increased head congestion.Unable to use CPAP while her head is congested.her ophthalmologist has asked her to get allergy testing done.  Years ago, allergy testing was positive and she was on allergy vaccine for a while. She does not remember details or response.  01/07/11-  44 yoF followed for obstructive sleep apnea and allergic rhinitis, complicated by insomnia, GERD, depression. Comes today for allergy skin testing off antihistamines. No recent respiratory infections. Wheeze very occasionally with exertion.  Anticipates seasonal flare as February pollens start. Had flu vaccine. Uses CPAP regularly "as long as I can breathe". Allergy Skin tests- significant positive reactions for numerous, and inhalant allergens, particularly grass weed and tree pollens, dust mite, cat and some molds. She wishes to start allergy vaccine.  03/12/11-  44 yoF followed for obstructive sleep apnea and allergic rhinitis, complicated by insomnia, GERD, depression. Wearing CPAP less consistently in the last 2 weeks because of nasal congestion. Building allergy vaccine without problems. Taking Allegra-D plus Sudafed. We discussed this. Using saline nasal rinse. Continues  Advair 250. Using rescue inhaler about twice a week.  07/13/11- 44 yoF followed for obstructive sleep apnea and allergic rhinitis, complicated by insomnia, GERD, depression. Having reactions in left arm only. We are holding her allergy vaccine at 1:50/0.4 mL. CPAP 11/Advanced-working very well for her. She describes good compliance and control with no problems.  10/13/11- 46 yoF followed for obstructive sleep apnea and allergic rhinitis, complicated by insomnia, GERD, depression. On vaccine(no reaction for about 3 wks and then started today)itchy red area.  Has had flu vaccine. Holding at 1:50, 0.3 mL's. She does say she considers herself better off on allergy vaccine. Her new eye doctor asked her to see me with question of whether allergic sinus problems could be related to 3 episodes of iritis. She is now on steroid eyedrops. She continues daily Zyrtec and Singulair with Benadryl at night.  Review of Systems- see HPI Constitutional:   No-   weight loss, night sweats, fevers, chills, fatigue, lassitude. HEENT:   No-  headaches, difficulty swallowing, tooth/dental problems, sore throat,       No- sneezing, itching, ear ache, +nasal congestion, little post nasal drip,  CV:  No-   chest pain, orthopnea, PND, swelling in lower extremities, anasarca, dizziness, palpitations Resp: No-   shortness of breath with exertion or at rest.              No-   productive cough,  Occasional non-productive cough,  No- coughing up of blood.              No-   change in color of mucus.  No- wheezing.   Skin: No-   rash or lesions.  GI:  No-   heartburn, indigestion, abdominal pain, nausea, vomiting,  GU: MS:   Neuro-     nothing unusual Psych:  No- change in mood or affect. No depression or anxiety.  No memory loss.  Objective:   Physical Exam General- Alert, Oriented, Affect-appropriate, Distress- none acute Skin- rash-none, lesions- none, excoriation- none Lymphadenopathy- none Head- atraumatic             Eyes- Gross vision intact, PERRLA, conjunctivae clear secretions            Ears- Hearing, canals-normal            Nose- +mucus bridging, turinate edema, no-Septal dev, polyps, erosion, perforation             Throat- Mallampati II-III , mucosa clear , drainage- none, tonsils- atrophic Neck- flexible , trachea midline, no stridor , thyroid nl, carotid no bruit Chest - symmetrical excursion , unlabored           Heart/CV- RRR , no murmur , no gallop  , no rub, nl s1 s2                           - JVD- none , edema- none, stasis changes- none, varices- none           Lung- clear to P&A, wheeze- none, cough- none  , dullness-none, rub- none           Chest wall-  Abd-  Br/ Gen/ Rectal- Not done, not indicated Extrem- cyanosis- none, clubbing, none, atrophy- none, strength- nl Neuro- grossly intact to observation

## 2011-10-20 NOTE — Assessment & Plan Note (Signed)
Generally good compliance and control

## 2011-10-20 NOTE — Assessment & Plan Note (Signed)
I don't think we can push up her allergy vaccine dose now. We can continue allergy vaccine and an trial of a nasal steroid inhaler. We will address the nasal sinus allergy component and hope that helps manage the eyes. The nasal spray may backtrack up her lachrymal ducts to the conjunctiva fluid.

## 2011-10-20 NOTE — Assessment & Plan Note (Signed)
Mild intermittent asthma, controlled 

## 2011-10-23 ENCOUNTER — Ambulatory Visit (INDEPENDENT_AMBULATORY_CARE_PROVIDER_SITE_OTHER): Payer: BC Managed Care – PPO

## 2011-10-23 DIAGNOSIS — J309 Allergic rhinitis, unspecified: Secondary | ICD-10-CM

## 2011-10-24 ENCOUNTER — Other Ambulatory Visit: Payer: Self-pay | Admitting: Internal Medicine

## 2011-10-29 ENCOUNTER — Ambulatory Visit (INDEPENDENT_AMBULATORY_CARE_PROVIDER_SITE_OTHER): Payer: BC Managed Care – PPO

## 2011-10-29 DIAGNOSIS — J309 Allergic rhinitis, unspecified: Secondary | ICD-10-CM

## 2011-11-02 ENCOUNTER — Ambulatory Visit
Admission: RE | Admit: 2011-11-02 | Discharge: 2011-11-02 | Disposition: A | Discharge status: Home / Self Care | Payer: BC Managed Care – PPO | Source: Ambulatory Visit | Attending: Rheumatology | Admitting: Rheumatology

## 2011-11-02 ENCOUNTER — Ambulatory Visit (INDEPENDENT_AMBULATORY_CARE_PROVIDER_SITE_OTHER): Payer: BC Managed Care – PPO

## 2011-11-02 ENCOUNTER — Other Ambulatory Visit: Payer: Self-pay | Admitting: Rheumatology

## 2011-11-02 DIAGNOSIS — M549 Dorsalgia, unspecified: Secondary | ICD-10-CM

## 2011-11-02 DIAGNOSIS — J309 Allergic rhinitis, unspecified: Secondary | ICD-10-CM

## 2011-11-02 DIAGNOSIS — R0602 Shortness of breath: Secondary | ICD-10-CM

## 2011-11-02 DIAGNOSIS — H209 Unspecified iridocyclitis: Secondary | ICD-10-CM

## 2011-11-06 ENCOUNTER — Telehealth: Payer: Self-pay

## 2011-11-06 NOTE — Telephone Encounter (Signed)
[name] with Dr Manning Charity request order for A1C due to drastic prescription change which could indicate problem with diabetes.Please call pt to schedule at [phone].

## 2011-11-08 NOTE — Telephone Encounter (Signed)
Please schedule non fasting lab for A1c for hyperglycemia

## 2011-11-09 ENCOUNTER — Other Ambulatory Visit: Payer: Self-pay | Admitting: Family Medicine

## 2011-11-09 ENCOUNTER — Ambulatory Visit (INDEPENDENT_AMBULATORY_CARE_PROVIDER_SITE_OTHER): Payer: BC Managed Care – PPO

## 2011-11-09 DIAGNOSIS — J309 Allergic rhinitis, unspecified: Secondary | ICD-10-CM

## 2011-11-09 MED ORDER — METHYLPHENIDATE HCL ER (OSM) 18 MG PO TBCR: 18.0000 mg | EXTENDED_RELEASE_TABLET | ORAL | Status: DC

## 2011-11-09 NOTE — Telephone Encounter (Signed)
Notified pt Rx ready for pick up 

## 2011-11-09 NOTE — Telephone Encounter (Signed)
Px printed for pick up in IN box  

## 2011-11-09 NOTE — Telephone Encounter (Signed)
Lab appt scheduled for 11/11/11

## 2011-11-11 ENCOUNTER — Other Ambulatory Visit (INDEPENDENT_AMBULATORY_CARE_PROVIDER_SITE_OTHER): Payer: BC Managed Care – PPO

## 2011-11-11 DIAGNOSIS — R7309 Other abnormal glucose: Secondary | ICD-10-CM

## 2011-11-11 DIAGNOSIS — R739 Hyperglycemia, unspecified: Secondary | ICD-10-CM

## 2011-11-12 ENCOUNTER — Telehealth: Payer: Self-pay | Admitting: Family Medicine

## 2011-11-12 NOTE — Telephone Encounter (Signed)
Error

## 2011-11-17 ENCOUNTER — Ambulatory Visit
Admission: RE | Admit: 2011-11-17 | Discharge: 2011-11-17 | Disposition: A | Discharge status: Home / Self Care | Payer: BC Managed Care – PPO | Source: Ambulatory Visit | Attending: Family Medicine | Admitting: Family Medicine

## 2011-11-17 ENCOUNTER — Ambulatory Visit (INDEPENDENT_AMBULATORY_CARE_PROVIDER_SITE_OTHER): Payer: BC Managed Care – PPO

## 2011-11-17 DIAGNOSIS — J309 Allergic rhinitis, unspecified: Secondary | ICD-10-CM

## 2011-11-17 DIAGNOSIS — Z1231 Encounter for screening mammogram for malignant neoplasm of breast: Secondary | ICD-10-CM

## 2011-11-23 ENCOUNTER — Ambulatory Visit (INDEPENDENT_AMBULATORY_CARE_PROVIDER_SITE_OTHER): Payer: BC Managed Care – PPO

## 2011-11-23 DIAGNOSIS — J309 Allergic rhinitis, unspecified: Secondary | ICD-10-CM

## 2011-11-30 ENCOUNTER — Ambulatory Visit (INDEPENDENT_AMBULATORY_CARE_PROVIDER_SITE_OTHER): Payer: BC Managed Care – PPO

## 2011-11-30 DIAGNOSIS — J309 Allergic rhinitis, unspecified: Secondary | ICD-10-CM

## 2011-12-02 ENCOUNTER — Ambulatory Visit (INDEPENDENT_AMBULATORY_CARE_PROVIDER_SITE_OTHER): Payer: BC Managed Care – PPO

## 2011-12-02 DIAGNOSIS — J309 Allergic rhinitis, unspecified: Secondary | ICD-10-CM

## 2011-12-10 ENCOUNTER — Ambulatory Visit (INDEPENDENT_AMBULATORY_CARE_PROVIDER_SITE_OTHER): Payer: BC Managed Care – PPO

## 2011-12-10 DIAGNOSIS — J309 Allergic rhinitis, unspecified: Secondary | ICD-10-CM

## 2011-12-17 ENCOUNTER — Ambulatory Visit (INDEPENDENT_AMBULATORY_CARE_PROVIDER_SITE_OTHER): Payer: BC Managed Care – PPO

## 2011-12-17 DIAGNOSIS — J309 Allergic rhinitis, unspecified: Secondary | ICD-10-CM

## 2011-12-28 ENCOUNTER — Ambulatory Visit (INDEPENDENT_AMBULATORY_CARE_PROVIDER_SITE_OTHER): Payer: BC Managed Care – PPO

## 2011-12-28 DIAGNOSIS — J309 Allergic rhinitis, unspecified: Secondary | ICD-10-CM

## 2012-01-05 ENCOUNTER — Encounter: Payer: Self-pay | Admitting: Internal Medicine

## 2012-01-08 ENCOUNTER — Ambulatory Visit (INDEPENDENT_AMBULATORY_CARE_PROVIDER_SITE_OTHER): Payer: Medicare Other

## 2012-01-08 DIAGNOSIS — J309 Allergic rhinitis, unspecified: Secondary | ICD-10-CM

## 2012-01-11 ENCOUNTER — Ambulatory Visit: Payer: BC Managed Care – PPO | Admitting: Internal Medicine

## 2012-01-11 ENCOUNTER — Ambulatory Visit (INDEPENDENT_AMBULATORY_CARE_PROVIDER_SITE_OTHER): Payer: Medicare Other

## 2012-01-11 DIAGNOSIS — J309 Allergic rhinitis, unspecified: Secondary | ICD-10-CM

## 2012-01-18 ENCOUNTER — Ambulatory Visit (INDEPENDENT_AMBULATORY_CARE_PROVIDER_SITE_OTHER): Payer: Medicare Other

## 2012-01-18 DIAGNOSIS — J309 Allergic rhinitis, unspecified: Secondary | ICD-10-CM

## 2012-01-25 ENCOUNTER — Ambulatory Visit (INDEPENDENT_AMBULATORY_CARE_PROVIDER_SITE_OTHER): Payer: Medicare Other

## 2012-01-25 DIAGNOSIS — J309 Allergic rhinitis, unspecified: Secondary | ICD-10-CM

## 2012-02-01 ENCOUNTER — Ambulatory Visit (INDEPENDENT_AMBULATORY_CARE_PROVIDER_SITE_OTHER): Payer: Medicare Other

## 2012-02-01 DIAGNOSIS — J309 Allergic rhinitis, unspecified: Secondary | ICD-10-CM

## 2012-02-08 ENCOUNTER — Ambulatory Visit (INDEPENDENT_AMBULATORY_CARE_PROVIDER_SITE_OTHER): Payer: Medicare Other

## 2012-02-08 ENCOUNTER — Ambulatory Visit: Payer: Medicare Other

## 2012-02-08 DIAGNOSIS — J309 Allergic rhinitis, unspecified: Secondary | ICD-10-CM

## 2012-02-15 ENCOUNTER — Ambulatory Visit: Payer: Medicare Other

## 2012-02-15 ENCOUNTER — Ambulatory Visit (INDEPENDENT_AMBULATORY_CARE_PROVIDER_SITE_OTHER): Payer: Medicare Other

## 2012-02-15 ENCOUNTER — Ambulatory Visit (INDEPENDENT_AMBULATORY_CARE_PROVIDER_SITE_OTHER): Payer: Medicare Other | Admitting: Internal Medicine

## 2012-02-15 ENCOUNTER — Encounter: Payer: Self-pay | Admitting: Internal Medicine

## 2012-02-15 VITALS — BP 160/92 | HR 90 | Ht 67.0 in | Wt 191.2 lb

## 2012-02-15 DIAGNOSIS — J309 Allergic rhinitis, unspecified: Secondary | ICD-10-CM

## 2012-02-15 DIAGNOSIS — J302 Other seasonal allergic rhinitis: Secondary | ICD-10-CM

## 2012-02-15 DIAGNOSIS — G4733 Obstructive sleep apnea (adult) (pediatric): Secondary | ICD-10-CM

## 2012-02-15 DIAGNOSIS — H20029 Recurrent acute iridocyclitis, unspecified eye: Secondary | ICD-10-CM

## 2012-02-15 NOTE — Progress Notes (Signed)
Patient ID: [name], female    DOB: 1965-06-10, 47 y.o.   MRN: [id number]  HPI 31 yoF followed for obstructive sleep apnea and allergic rhinitis, complicated by insomnia, GERD, depression. Last here March 9 when we gave depo shot which made her much better. She was able to go back to full time use of her CPAP machine - 11 cwp Advanced. We discussed her meds. She feels well today.   11/06/10- 44 yoF followed for obstructive sleep apnea and allergic rhinitis, complicated by insomnia, GERD, depression. Has had flu vaccine. Had sustained head congestion and productive cough one month ago. Her primary physician treated Augmentin and cough syrup. She was clear after that until 4 days ago. One day after her flu shot, she woke with scratchy throat, no fever, increased head congestion.Unable to use CPAP while her head is congested.her ophthalmologist has asked her to get allergy testing done.  Years ago, allergy testing was positive and she was on allergy vaccine for a while. She does not remember details or response.  01/07/11-  44 yoF followed for obstructive sleep apnea and allergic rhinitis, complicated by insomnia, GERD, depression. Comes today for allergy skin testing off antihistamines. No recent respiratory infections. Wheeze very occasionally with exertion.  Anticipates seasonal flare as February pollens start. Had flu vaccine. Uses CPAP regularly "as long as I can breathe". Allergy Skin tests- significant positive reactions for numerous, and inhalant allergens, particularly grass weed and tree pollens, dust mite, cat and some molds. She wishes to start allergy vaccine.  03/12/11-  44 yoF followed for obstructive sleep apnea and allergic rhinitis, complicated by insomnia, GERD, depression. Wearing CPAP less consistently in the last 2 weeks because of nasal congestion. Building allergy vaccine without problems. Taking Allegra-D plus Sudafed. We discussed this. Using saline nasal rinse. Continues  Advair 250. Using rescue inhaler about twice a week.  07/13/11- 44 yoF followed for obstructive sleep apnea and allergic rhinitis, complicated by insomnia, GERD, depression. Having reactions in left arm only. We are holding her allergy vaccine at 1:50/0.4 mL. CPAP 11/Advanced-working very well for her. She describes good compliance and control with no problems.  10/13/11- 46 yoF followed for obstructive sleep apnea and allergic rhinitis, complicated by insomnia, GERD, depression. On vaccine(no reaction for about 3 wks and then started today)itchy red area.  Has had flu vaccine. Holding at 1:50, 0.3 mL's. She does say she considers herself better off on allergy vaccine. Her new eye doctor asked her to see me with question of whether allergic sinus problems could be related to 3 episodes of iritis. She is now on steroid eyedrops. She continues daily Zyrtec and Singulair with Benadryl at night.  02/15/12-46 yoF followed for obstructive sleep apnea and allergic rhinitis, complicated by insomnia, GERD, depression. FOLLOWS FOR: on allergy vaccine 1:50 0.3 mL GH and still having slight reactions; got shot today and slight redness around injection site. Minor local reaction occasionally to her allergy shots. She mentions these when asked but says they don't bother her at all. Still occasional episode of iritis, one eye or the other, with no pattern. Continues doing well with CPAP 11/Advanced used all night every night. CXR 11/02/11 IMPRESSION:  Negative  Original Report Authenticated By: Camelia Phenes, M.D.  Review of Systems- see HPI Constitutional:   No-   weight loss, night sweats, fevers, chills, fatigue, lassitude. HEENT:   No-  headaches, difficulty swallowing, tooth/dental problems, sore throat,       No- sneezing, itching,  ear ache, +nasal congestion, little post nasal drip,  CV:  No-   chest pain, orthopnea, PND, swelling in lower extremities, anasarca, dizziness, palpitations Resp: No-    shortness of breath with exertion or at rest.              No-   productive cough, occasional non-productive cough,  No- coughing up of blood.              No-   change in color of mucus.  No- wheezing.   Skin: No-   rash or lesions. GI:  No-   heartburn, indigestion, abdominal pain, nausea, vomiting,  GU: MS:   Neuro-     nothing unusual Psych:  No- change in mood or affect. No depression or anxiety.  No memory loss.  Objective:   Physical Exam General- Alert, Oriented, Affect-appropriate, Distress- none acute Skin- +keratosis pilaris Lymphadenopathy- none Head- atraumatic            Eyes- Gross vision intact, PERRLA, conjunctivae clear secretions            Ears- Hearing, canals-normal            Nose- +mucus bridging, turinate edema, +Septal dev,no- polyps, erosion, perforation             Throat- Mallampati II-III , mucosa clear , drainage- none, tonsils- atrophic Neck- flexible , trachea midline, no stridor , thyroid nl, carotid no bruit Chest - symmetrical excursion , unlabored           Heart/CV- RRR , no murmur , no gallop  , no rub, nl s1 s2                           - JVD- none , edema- none, stasis changes- none, varices- none           Lung- clear to P&A, wheeze- none, cough- none  , dullness-none, rub- none           Chest wall-  Abd-  Br/ Gen/ Rectal- Not done, not indicated Extrem- cyanosis- none, clubbing, none, atrophy- none, strength- nl Neuro- grossly intact to observation

## 2012-02-15 NOTE — Patient Instructions (Addendum)
We can continue CPAP 11/ advanced  We can continue Allergy Vaccine at 1:50. Please let us know if local reactions to your shots get worse, or you have concerns.  Please also watch for any relation between your allergic nose and when your iritis flares.

## 2012-02-16 ENCOUNTER — Ambulatory Visit (INDEPENDENT_AMBULATORY_CARE_PROVIDER_SITE_OTHER): Payer: Medicare Other | Admitting: Family Medicine

## 2012-02-16 ENCOUNTER — Encounter: Payer: Self-pay | Admitting: Family Medicine

## 2012-02-16 VITALS — BP 102/70 | HR 88 | Temp 98.6°F | Ht 67.0 in | Wt 190.8 lb

## 2012-02-16 DIAGNOSIS — F988 Other specified behavioral and emotional disorders with onset usually occurring in childhood and adolescence: Secondary | ICD-10-CM

## 2012-02-16 DIAGNOSIS — I10 Essential (primary) hypertension: Secondary | ICD-10-CM

## 2012-02-16 MED ORDER — BUPROPION HCL ER (XL) 300 MG PO TB24: 300.0000 mg | ORAL_TABLET | Freq: Every day | ORAL | Status: DC

## 2012-02-16 MED ORDER — OLMESARTAN MEDOXOMIL 40 MG PO TABS: 40.0000 mg | ORAL_TABLET | Freq: Every day | ORAL | Status: DC

## 2012-02-16 MED ORDER — METHYLPHENIDATE HCL ER (OSM) 27 MG PO TBCR: 27.0000 mg | EXTENDED_RELEASE_TABLET | ORAL | Status: DC

## 2012-02-16 NOTE — Progress Notes (Signed)
Subjective:    Patient ID: [name], female    DOB: Mar 24, 1965, 47 y.o.   MRN: [id number]  HPI Here for f/u of HTN   Feeling good overall   The concerta does not cause problems with bp or pulse  She needs to go up on the dose- having more trouble with concentration  Not finishing tasks effectively Has ADD and fibro fog adds to it  No headaches   bp is stable today  No cp or palpitations or headaches or edema  No side effects to medicines  BP Readings from Last 3 Encounters:  02/16/12 102/70  02/15/12 160/92  10/13/11 140/100     On coreg     Chemistry      Component Value Date/Time   NA 139 10/02/2011 1615   K 4.4 10/02/2011 1615   CL 103 10/02/2011 1615   CO2 28 10/02/2011 1615   BUN 10 10/02/2011 1615   CREATININE 0.96 10/02/2011 1615   CREATININE 0.8 03/25/2010 1112      Component Value Date/Time   CALCIUM 9.7 10/02/2011 1615   ALKPHOS 92 10/02/2011 1615   AST 17 10/02/2011 1615   ALT 9 10/02/2011 1615   BILITOT 0.4 10/02/2011 1615      Lab Results  Component Value Date   CHOL 197 10/02/2011   HDL 56 10/02/2011   LDLCALC 113* 10/02/2011   TRIG 139 10/02/2011   CHOLHDL 3.5 10/02/2011    Wt is down 1 lb with bmi of 29  Patient Active Problem List  Diagnosis  . DEPRESSION  . OBSTRUCTIVE SLEEP APNEA  . HYPERTENSION  . ALLERGIC RHINITIS  . ASTHMA  . GERD  . IBS  . ENDOMETRIOSIS  . FEMALE INFERTILITY  . DYSHIDROTIC ECZEMA, HANDS  . FIBROMYALGIA  . INSOMNIA  . ADD (attention deficit disorder)  . Routine general medical examination at a health care facility  . Iritis, recurrent  . Tachycardia  . Other screening mammogram   Past Medical History  Diagnosis Date  . Allergy     allergic rhinitis  . Asthma   . Depression   . GERD (gastroesophageal reflux disease)   . Hypertension   . Fibromyalgia   . Degenerative disc disease   . Sleep apnea   . Fx of fibula 2009  . IBS (irritable bowel syndrome)   . Chronic headaches   . Hemorrhoids    Past  Surgical History  Procedure Laterality Date  . Laparoscopy  08/2004    endometriosis - several   . Breast surgery      left nipple removed - benign tumor  . Anterior cruciate ligament repair  1996    small tear of ACL  . Bunionectomy    . Tonsillectomy    . Abdominal hysterectomy     History  Substance Use Topics  . Smoking status: Never Smoker   . Smokeless tobacco: Never Used  . Alcohol Use: No   Family History  Problem Relation Age of Onset  . Hypertension Mother   . Thyroid disease Mother   . Fibromyalgia Mother   . Diabetes Father   . Kidney cancer Maternal Grandmother   . Colon cancer Neg Hx   . Colon polyps Mother    Allergies  Allergen Reactions  . Cetirizine Hcl     REACTION: not effective  . Erythromycin     REACTION: stomach hurts  . Escitalopram Oxalate     REACTION: no improvement  . Lisinopril     REACTION:  cough  . Loratadine     REACTION: not effective  . Nifedipine     REACTION: fatigued and tremor  . Sertraline Hcl     REACTION: no help  . Vyvanse (Lisdexamfetamine Dimesylate)     Headache and excitablility   Current Outpatient Prescriptions on File Prior to Visit  Medication Sig Dispense Refill  . acetaminophen (TYLENOL) 500 MG tablet OTC as directed.       Marland Kitchen albuterol (PROAIR HFA) 108 (90 BASE) MCG/ACT inhaler Inhale 2 puffs into the lungs every 4 (four) hours as needed for wheezing. for wheezing.  3 Inhaler  3  . buPROPion (WELLBUTRIN XL) 300 MG 24 hr tablet TAKE 1 TABLET BY MOUTH EVERY MORNING  30 tablet  11  . carvedilol (COREG) 6.25 MG tablet Take 1 tablet (6.25 mg total) by mouth 2 (two) times daily with a meal.  60 tablet  11  . diphenhydrAMINE (BENADRYL) 25 mg capsule OTC as directed.       . methocarbamol (ROBAXIN) 500 MG tablet Take 1 tablet (500 mg total) by mouth at bedtime as needed.  30 tablet  5  . methylphenidate (CONCERTA) 18 MG CR tablet Take 1 tablet (18 mg total) by mouth every morning.  30 tablet  0  . montelukast  (SINGULAIR) 10 MG tablet 1 by mouth once daily for asthma and allergy symptoms.  90 tablet  3  . olmesartan (BENICAR) 40 MG tablet Take 1 tablet (40 mg total) by mouth daily.  30 tablet  6  . prednisoLONE acetate (PRED FORTE) 1 % ophthalmic suspension 1 drop each eye daily on Mon, Wed, and Fridays      . Pseudoeph-Doxylamine-DM-APAP (NYQUIL PO) Take by mouth as directed.        . traMADol (ULTRAM) 50 MG tablet Take 1 tablet (50 mg total) by mouth every 8 (eight) hours as needed for pain.  30 tablet  3  . Fluticasone-Salmeterol (ADVAIR) 250-50 MCG/DOSE AEPB Rinse mouth. Use 1 puff, twice daily       No current facility-administered medications on file prior to visit.      Review of Systems Review of Systems  Constitutional: Negative for fever, appetite change, fatigue and unexpected weight change.  Eyes: Negative for pain and visual disturbance.  Respiratory: Negative for cough and shortness of breath.   Cardiovascular: Negative for cp or palpitations    Gastrointestinal: Negative for nausea, diarrhea and constipation.  Genitourinary: Negative for urgency and frequency.  Skin: Negative for pallor or rash   Neurological: Negative for weakness, light-headedness, numbness and headaches.  Hematological: Negative for adenopathy. Does not bruise/bleed easily.  Psychiatric/Behavioral: anx/ depression are under fair control , pos for trouble concentrating        Objective:   Physical Exam  Constitutional: She appears well-developed and well-nourished. No distress.  HENT:  Head: Normocephalic and atraumatic.  Mouth/Throat: Oropharynx is clear and moist.  Eyes: Conjunctivae and EOM are normal. Pupils are equal, round, and reactive to light. No scleral icterus.  Neck: Normal range of motion. Neck supple. No JVD present. Carotid bruit is not present. No thyromegaly present.  Cardiovascular: Normal rate, regular rhythm, normal heart sounds and intact distal pulses.  Exam reveals no gallop.    Pulmonary/Chest: Effort normal and breath sounds normal. No respiratory distress. She has no wheezes.  Abdominal: Soft. Bowel sounds are normal. She exhibits no distension, no abdominal bruit and no mass.  Musculoskeletal: She exhibits no edema.  Lymphadenopathy:    She has no  cervical adenopathy.  Neurological: She is alert. She has normal reflexes. No cranial nerve deficit. She exhibits normal muscle tone. Coordination normal.  Skin: Skin is warm and dry. No rash noted.  Psychiatric: She has a normal mood and affect. Her behavior is normal. Thought content normal.          Assessment & Plan:

## 2012-02-16 NOTE — Assessment & Plan Note (Signed)
bp in fair control at this time  No changes needed  Disc lifstyle change with low sodium diet and exercise   Will continue benicar and coreg Will watch with inc in concerta

## 2012-02-16 NOTE — Patient Instructions (Addendum)
I'm glad your bp is good  Increase concerta to 27 mg daily- if side effects or increase bp let me know  Take care of yourself  Try to work on getting some exercise  Schedule annual exam oct 2014 with labs prior

## 2012-02-16 NOTE — Assessment & Plan Note (Signed)
Pt req inc in concerta for concentration Also has fibro fog  Will inc to 27 mg and update

## 2012-02-21 NOTE — Assessment & Plan Note (Signed)
It is not apparent that there is a relation between her iritis and her allergic rhinitis. Symptoms do not parallel each other.We will be watching his the spring pollen season comes in.

## 2012-02-21 NOTE — Assessment & Plan Note (Signed)
Good compliance and control with CPAP. No changes necessary now.

## 2012-02-21 NOTE — Assessment & Plan Note (Signed)
She is satisfied to continue allergy vaccine and not concerned about minor local reaction. We discussed this emphasizing her safety.

## 2012-02-22 ENCOUNTER — Ambulatory Visit: Payer: Medicare Other

## 2012-02-29 ENCOUNTER — Ambulatory Visit (INDEPENDENT_AMBULATORY_CARE_PROVIDER_SITE_OTHER): Payer: Medicare Other

## 2012-02-29 ENCOUNTER — Ambulatory Visit: Payer: Medicare Other

## 2012-02-29 DIAGNOSIS — J309 Allergic rhinitis, unspecified: Secondary | ICD-10-CM

## 2012-03-07 ENCOUNTER — Ambulatory Visit: Payer: Medicare Other

## 2012-03-09 ENCOUNTER — Ambulatory Visit (INDEPENDENT_AMBULATORY_CARE_PROVIDER_SITE_OTHER): Payer: Medicare Other

## 2012-03-09 DIAGNOSIS — J309 Allergic rhinitis, unspecified: Secondary | ICD-10-CM

## 2012-03-14 ENCOUNTER — Ambulatory Visit (INDEPENDENT_AMBULATORY_CARE_PROVIDER_SITE_OTHER): Payer: Medicare Other

## 2012-03-14 DIAGNOSIS — J309 Allergic rhinitis, unspecified: Secondary | ICD-10-CM

## 2012-03-21 ENCOUNTER — Ambulatory Visit: Payer: Medicare Other

## 2012-03-22 ENCOUNTER — Telehealth: Payer: Self-pay

## 2012-03-22 NOTE — Telephone Encounter (Signed)
Pt said eye dr; Dr Emily Filbert wants HLA 27,repeat hgb A1C and TB skin test and Dr Milinda Antis to evaluate pt if OK for systemic steroids. Pt scheduled appt with Dr Milinda Antis 03/23/12 at 9:15.

## 2012-03-23 ENCOUNTER — Ambulatory Visit: Payer: Medicare Other

## 2012-03-23 ENCOUNTER — Encounter: Payer: Self-pay | Admitting: Family Medicine

## 2012-03-23 ENCOUNTER — Ambulatory Visit (INDEPENDENT_AMBULATORY_CARE_PROVIDER_SITE_OTHER): Payer: Medicare Other | Admitting: Family Medicine

## 2012-03-23 VITALS — BP 130/84 | HR 84 | Temp 98.8°F | Ht 67.0 in | Wt 189.8 lb

## 2012-03-23 DIAGNOSIS — H35341 Macular cyst, hole, or pseudohole, right eye: Secondary | ICD-10-CM

## 2012-03-23 DIAGNOSIS — H35349 Macular cyst, hole, or pseudohole, unspecified eye: Secondary | ICD-10-CM

## 2012-03-23 DIAGNOSIS — R7309 Other abnormal glucose: Secondary | ICD-10-CM

## 2012-03-23 DIAGNOSIS — H20021 Recurrent acute iridocyclitis, right eye: Secondary | ICD-10-CM

## 2012-03-23 DIAGNOSIS — R739 Hyperglycemia, unspecified: Secondary | ICD-10-CM | POA: Insufficient documentation

## 2012-03-23 DIAGNOSIS — Z111 Encounter for screening for respiratory tuberculosis: Secondary | ICD-10-CM

## 2012-03-23 DIAGNOSIS — H20029 Recurrent acute iridocyclitis, unspecified eye: Secondary | ICD-10-CM

## 2012-03-23 MED ORDER — TUBERCULIN PPD 5 UNIT/0.1ML ID SOLN: 5.0000 [IU] | Freq: Once | INTRADERMAL | Status: DC

## 2012-03-23 MED ORDER — METHYLPHENIDATE HCL ER (OSM) 27 MG PO TBCR: 27.0000 mg | EXTENDED_RELEASE_TABLET | ORAL | Status: DC

## 2012-03-23 NOTE — Progress Notes (Signed)
Subjective:    Patient ID: [name], female    DOB: Sep 09, 1965, 47 y.o.   MRN: [id number]  HPI Here to follow up  Still has iritis - and her opthy wants her to have HLAB-27 Also wants her to start steroids (will be soon)  Still having symptoms - mostly in R eye - still has blurry vision , and vision has changed in both eyes anyway    To make sure that is ok  Wants to repeat Hb a1c Also do TB skin test   Lab Results  Component Value Date   HGBA1C 6.2 11/11/2011     Has dx of chronic iritis and vitreal macular hole -- will see another Dr tomorrow (Dr Ashley Royalty)- about that    Patient Active Problem List  Diagnosis  . DEPRESSION  . OBSTRUCTIVE SLEEP APNEA  . HYPERTENSION  . Seasonal and perennial allergic rhinitis  . ASTHMA  . GERD  . IBS  . ENDOMETRIOSIS  . FEMALE INFERTILITY  . DYSHIDROTIC ECZEMA, HANDS  . FIBROMYALGIA  . INSOMNIA  . ADD (attention deficit disorder)  . Routine general medical examination at a health care facility  . Iritis, recurrent  . Tachycardia  . Other screening mammogram  . Hyperglycemia  . Macular hole  . Screening-pulmonary TB   Past Medical History  Diagnosis Date  . Allergy     allergic rhinitis  . Asthma   . Depression   . GERD (gastroesophageal reflux disease)   . Hypertension   . Fibromyalgia   . Degenerative disc disease   . Sleep apnea   . Fx of fibula 2009  . IBS (irritable bowel syndrome)   . Chronic headaches   . Hemorrhoids    Past Surgical History  Procedure Laterality Date  . Laparoscopy  08/2004    endometriosis - several   . Breast surgery      left nipple removed - benign tumor  . Anterior cruciate ligament repair  1996    small tear of ACL  . Bunionectomy    . Tonsillectomy    . Abdominal hysterectomy     History  Substance Use Topics  . Smoking status: Never Smoker   . Smokeless tobacco: Never Used  . Alcohol Use: No   Family History  Problem Relation Age of Onset  . Hypertension Mother    . Thyroid disease Mother   . Fibromyalgia Mother   . Diabetes Father   . Kidney cancer Maternal Grandmother   . Colon cancer Neg Hx   . Colon polyps Mother    Allergies  Allergen Reactions  . Cetirizine Hcl     REACTION: not effective  . Erythromycin     REACTION: stomach hurts  . Escitalopram Oxalate     REACTION: no improvement  . Lisinopril     REACTION: cough  . Loratadine     REACTION: not effective  . Nifedipine     REACTION: fatigued and tremor  . Sertraline Hcl     REACTION: no help  . Vyvanse (Lisdexamfetamine Dimesylate)     Headache and excitablility   Current Outpatient Prescriptions on File Prior to Visit  Medication Sig Dispense Refill  . acetaminophen (TYLENOL) 500 MG tablet OTC as directed.       Marland Kitchen albuterol (PROAIR HFA) 108 (90 BASE) MCG/ACT inhaler Inhale 2 puffs into the lungs every 4 (four) hours as needed for wheezing. for wheezing.  3 Inhaler  3  . buPROPion (WELLBUTRIN XL) 300 MG 24 hr  tablet Take 1 tablet (300 mg total) by mouth daily.  90 tablet  3  . carvedilol (COREG) 6.25 MG tablet Take 1 tablet (6.25 mg total) by mouth 2 (two) times daily with a meal.  60 tablet  11  . diphenhydrAMINE (BENADRYL) 25 mg capsule OTC as directed.       . Fluticasone-Salmeterol (ADVAIR) 250-50 MCG/DOSE AEPB as needed. Rinse mouth. Use 1 puff, twice daily      . methocarbamol (ROBAXIN) 500 MG tablet Take 1 tablet (500 mg total) by mouth at bedtime as needed.  30 tablet  5  . montelukast (SINGULAIR) 10 MG tablet 1 by mouth once daily for asthma and allergy symptoms.  90 tablet  3  . olmesartan (BENICAR) 40 MG tablet Take 1 tablet (40 mg total) by mouth daily.  90 tablet  3  . prednisoLONE acetate (PRED FORTE) 1 % ophthalmic suspension Place 1 drop into both eyes 4 (four) times daily. 1 drop each eye daily on Mon, Wed, and Fridays      . Pseudoeph-Doxylamine-DM-APAP (NYQUIL PO) Take by mouth as needed.       . traMADol (ULTRAM) 50 MG tablet Take 1 tablet (50 mg total) by  mouth every 8 (eight) hours as needed for pain.  30 tablet  3   No current facility-administered medications on file prior to visit.    Review of Systems Review of Systems  Constitutional: Negative for fever, appetite change, and unexpected weight change.  Eyes: pos for discomfort/ blurriness R eye , neg for D/C Respiratory: Negative for cough and shortness of breath.   Cardiovascular: Negative for cp or palpitations    Gastrointestinal: Negative for nausea, diarrhea and constipation.  Genitourinary: Negative for urgency and frequency.  Skin: Negative for pallor or rash   MSK pos for myofascial pain  Neurological: Negative for weakness, light-headedness, numbness and headaches.  Hematological: Negative for adenopathy. Does not bruise/bleed easily.  Psychiatric/Behavioral: Negative for dysphoric mood. The patient is not nervous/anxious.         Objective:   Physical Exam  Constitutional: She appears well-developed and well-nourished. No distress.  overwt and well app  HENT:  Head: Normocephalic and atraumatic.  Mouth/Throat: Oropharynx is clear and moist.  Eyes: EOM are normal. Pupils are equal, round, and reactive to light. Right eye exhibits no discharge. Left eye exhibits no discharge. No scleral icterus.  inj cong in R eye No lid swelling   Neck: Normal range of motion. Neck supple.  Cardiovascular: Normal rate and regular rhythm.   Pulmonary/Chest: Effort normal and breath sounds normal.  Lymphadenopathy:    She has no cervical adenopathy.  Neurological: She is alert. She has normal reflexes.  Skin: Skin is warm and dry. No rash noted.  Psychiatric: She has a normal mood and affect.          Assessment & Plan:

## 2012-03-23 NOTE — Patient Instructions (Addendum)
TB skin test today - you will need this read here on friday Get your labs done at [facility]  See the eye doctors as planned - good luck with everything

## 2012-03-23 NOTE — Assessment & Plan Note (Signed)
In conj with iritis R eye To see Dr Fulton Reek soon

## 2012-03-23 NOTE — Assessment & Plan Note (Signed)
PPD today- in prep to go on steroids

## 2012-03-23 NOTE — Assessment & Plan Note (Signed)
Lab Results  Component Value Date   HGBA1C 6.2 11/11/2011    Disc need to watch sugar in diet and loose wt  For another at [facility] soon in prep for systemic steroids-pt aware her sugar will rise

## 2012-03-23 NOTE — Assessment & Plan Note (Signed)
Pt will get HLA B27 test and A1c at [facility] in prep for her steroid treatment  PPD today- to read on fri Disc side eff of steroids-pt well aware

## 2012-03-24 ENCOUNTER — Encounter: Payer: Self-pay | Admitting: Family Medicine

## 2012-03-24 ENCOUNTER — Encounter (INDEPENDENT_AMBULATORY_CARE_PROVIDER_SITE_OTHER): Payer: Medicare Other | Admitting: Ophthalmology

## 2012-03-24 ENCOUNTER — Ambulatory Visit (INDEPENDENT_AMBULATORY_CARE_PROVIDER_SITE_OTHER): Payer: Medicare Other

## 2012-03-24 DIAGNOSIS — H43819 Vitreous degeneration, unspecified eye: Secondary | ICD-10-CM

## 2012-03-24 DIAGNOSIS — H35349 Macular cyst, hole, or pseudohole, unspecified eye: Secondary | ICD-10-CM

## 2012-03-24 DIAGNOSIS — J309 Allergic rhinitis, unspecified: Secondary | ICD-10-CM

## 2012-03-24 DIAGNOSIS — H2510 Age-related nuclear cataract, unspecified eye: Secondary | ICD-10-CM

## 2012-03-24 DIAGNOSIS — H35359 Cystoid macular degeneration, unspecified eye: Secondary | ICD-10-CM

## 2012-03-24 DIAGNOSIS — H44119 Panuveitis, unspecified eye: Secondary | ICD-10-CM

## 2012-03-25 ENCOUNTER — Ambulatory Visit: Payer: Medicare Other | Admitting: *Deleted

## 2012-03-25 DIAGNOSIS — Z111 Encounter for screening for respiratory tuberculosis: Secondary | ICD-10-CM

## 2012-03-25 LAB — READ PPD: TB Skin Test: NEGATIVE

## 2012-03-28 ENCOUNTER — Ambulatory Visit (INDEPENDENT_AMBULATORY_CARE_PROVIDER_SITE_OTHER): Payer: Medicare Other

## 2012-03-28 DIAGNOSIS — J309 Allergic rhinitis, unspecified: Secondary | ICD-10-CM

## 2012-03-30 ENCOUNTER — Telehealth: Payer: Self-pay

## 2012-03-30 NOTE — Telephone Encounter (Signed)
Pt left v/m requesting results from labs done at [facility] last week.Please advise.

## 2012-03-30 NOTE — Telephone Encounter (Signed)
Pt notified of Dr. Tower's comments  

## 2012-03-30 NOTE — Telephone Encounter (Signed)
They have not been scanned into the computer yet -should be soon, will let her know  They should have sent results right to her eye doctor since his name was on the order as well

## 2012-03-31 ENCOUNTER — Ambulatory Visit: Payer: Medicare Other

## 2012-03-31 ENCOUNTER — Telehealth: Payer: Self-pay

## 2012-03-31 NOTE — Telephone Encounter (Signed)
[name] with Dr Dellia Nims office left v/m; pt had labs drawn a week ago and Dr Emily Filbert has not received lab report from [facility]. [name] wanted to know if we had received lab results from [facility] and if so can we send to Dr Emily Filbert.Please advise.

## 2012-03-31 NOTE — Telephone Encounter (Signed)
Thank you :)

## 2012-03-31 NOTE — Telephone Encounter (Signed)
Not scanned in yet- could you request the labs from [facility]? thanks

## 2012-03-31 NOTE — Telephone Encounter (Signed)
Called [facility] and requesting labs, received lab results and placed in your inbox for review and also faxed a copy to Dr. Emily Filbert

## 2012-04-04 ENCOUNTER — Ambulatory Visit: Payer: Medicare Other

## 2012-04-07 ENCOUNTER — Ambulatory Visit (INDEPENDENT_AMBULATORY_CARE_PROVIDER_SITE_OTHER): Payer: Medicare Other | Admitting: Family Medicine

## 2012-04-07 ENCOUNTER — Encounter: Payer: Self-pay | Admitting: Family Medicine

## 2012-04-07 VITALS — BP 122/82 | HR 97 | Temp 99.3°F | Wt 189.0 lb

## 2012-04-07 DIAGNOSIS — J069 Acute upper respiratory infection, unspecified: Secondary | ICD-10-CM

## 2012-04-07 MED ORDER — ONDANSETRON HCL 4 MG PO TABS: 4.0000 mg | ORAL_TABLET | Freq: Three times a day (TID) | ORAL | Status: DC | PRN

## 2012-04-07 MED ORDER — AMOXICILLIN-POT CLAVULANATE 875-125 MG PO TABS: 1.0000 | ORAL_TABLET | Freq: Two times a day (BID) | ORAL | Status: DC

## 2012-04-07 NOTE — Patient Instructions (Signed)
Use the zofran for nausea.  Drink plenty of fluids and use nasal saline.  This should gradually improve.  Take care.  Let us know if you have other concerns.   If you don't improve and continue to have pain across your sinuses, then start the antibiotics.

## 2012-04-07 NOTE — Progress Notes (Signed)
Sx started about 3 days ago.  ST initially, resolved in the meantime. No fevers, t max 99.5.  Cough, sleep disrupted. Sputum noted. Rhinorrhea. Some nausea w/o vomiting.  Minimal wheeze, hasn't been using either inhaler.  No sick contacts.  Pain with coughing noted last night.  Had a flu shot in the fall.  Pain at R frontal sinus.  Not using nasal saline.   Meds, vitals, and allergies reviewed.   ROS: See HPI.  Otherwise, noncontributory.  GEN: nad, alert and oriented HEENT: mucous membranes moist, tm w/o erythema, nasal exam w/o erythema, clear discharge noted,  OP with cobblestoning, R frontal sinus ttp NECK: supple w/o LA CV: rrr.   PULM: ctab, no inc wob EXT: no edema SKIN: no acute rash

## 2012-04-07 NOTE — Assessment & Plan Note (Signed)
Ctab.  Mild sx overall.  Nontoxic.  Would use zofran prn nausea.  Likely viral.  Supportive tx in meantime. Would start augmentin if sx are prolonged given the R frontal tenderness, currently mild.

## 2012-04-08 ENCOUNTER — Encounter: Payer: Self-pay | Admitting: Family Medicine

## 2012-04-08 ENCOUNTER — Telehealth: Payer: Self-pay

## 2012-04-08 NOTE — Telephone Encounter (Signed)
Received PA from [facility]; called [phone] spoke with Suzanna a nurse and she OKed PA  For 1 year; approval letter to follow; spoke with Faith at [facility] ,rx did go thru and pt notified by me.

## 2012-04-08 NOTE — Telephone Encounter (Signed)
Pt left v/m requesting call back re; PA for Ondansetron.

## 2012-04-11 ENCOUNTER — Encounter: Payer: Self-pay | Admitting: Internal Medicine

## 2012-04-11 ENCOUNTER — Ambulatory Visit: Payer: BC Managed Care – PPO | Admitting: Family Medicine

## 2012-04-11 ENCOUNTER — Ambulatory Visit (INDEPENDENT_AMBULATORY_CARE_PROVIDER_SITE_OTHER): Payer: Medicare Other

## 2012-04-11 DIAGNOSIS — J309 Allergic rhinitis, unspecified: Secondary | ICD-10-CM

## 2012-04-19 ENCOUNTER — Ambulatory Visit (INDEPENDENT_AMBULATORY_CARE_PROVIDER_SITE_OTHER): Payer: Medicare Other

## 2012-04-19 DIAGNOSIS — J309 Allergic rhinitis, unspecified: Secondary | ICD-10-CM

## 2012-04-20 ENCOUNTER — Telehealth: Payer: Self-pay | Admitting: Family Medicine

## 2012-04-20 MED ORDER — FLUCONAZOLE 150 MG PO TABS: 150.0000 mg | ORAL_TABLET | Freq: Once | ORAL | Status: DC

## 2012-04-20 NOTE — Telephone Encounter (Signed)
I sent diflucan to her pharmacy  F/u if no improvement  

## 2012-04-20 NOTE — Telephone Encounter (Signed)
Pt notified Rx sent to pharmacy and f/u if no improvement 

## 2012-04-20 NOTE — Telephone Encounter (Signed)
Patient Information:  Caller Name: Rilee  Phone: [phone]  Patient: [name], [name]  Gender: Female  DOB: 1965/10/18  Age: 47 Years  PCP: Tower, [employer] [facility])  Pregnant: No  Office Follow Up:  Does the office need to follow up with this patient?: Yes  Instructions For The Office: Request Diflucan after finishing Augmentin RX  RN Note:  Pharmacy- [facility]  [phone]  Please contact for any issues or question  Symptoms  Reason For Call & Symptoms: Patient was in the office on 04/07/12 and prescribed Augmentin. She has completed the medication and now has a yeast infection.   She started to try OTC  cream product in the past and  has a reaction in the past. Is requesting Diflucan Rx. White discharge, +itching, no odor.  She is currently on steroid eye drop since September.   REQUESTING RX AFTER TAKING ANTIBIOTIC  Reviewed Health History In EMR: Yes  Reviewed Medications In EMR: Yes  Reviewed Allergies In EMR: Yes  Reviewed Surgeries / Procedures: Yes  Date of Onset of Symptoms: 04/17/2012 OB / GYN:  LMP: Unknown  Guideline(s) Used:  Vaginal Discharge  Disposition Per Guideline:   Home Care  Reason For Disposition Reached:   Symptoms of a vaginal yeast infection (i.e., white, thick, cottage-cheese-like, itchy, not bad smelling discharge)  Advice Given:  Genital Hygiene:   Keep your genital area clean. Wash daily.  Keep your genital area dry. Wear cotton underwear or underwear with a cotton crotch.  Do not douche.  Do not use feminine hygiene products.  Call Back If:  There is no improvement after treating yourself for a vaginal yeast infection  You become worse.  RN Overrode Recommendation:  Patient Requests Prescription  Request Diflucan

## 2012-04-25 ENCOUNTER — Ambulatory Visit (INDEPENDENT_AMBULATORY_CARE_PROVIDER_SITE_OTHER): Payer: Medicare Other

## 2012-04-25 DIAGNOSIS — J309 Allergic rhinitis, unspecified: Secondary | ICD-10-CM

## 2012-05-02 ENCOUNTER — Ambulatory Visit (INDEPENDENT_AMBULATORY_CARE_PROVIDER_SITE_OTHER): Payer: Medicare Other

## 2012-05-02 DIAGNOSIS — J309 Allergic rhinitis, unspecified: Secondary | ICD-10-CM

## 2012-05-03 ENCOUNTER — Other Ambulatory Visit: Payer: Self-pay

## 2012-05-03 ENCOUNTER — Ambulatory Visit (INDEPENDENT_AMBULATORY_CARE_PROVIDER_SITE_OTHER): Payer: Medicare Other

## 2012-05-03 DIAGNOSIS — J309 Allergic rhinitis, unspecified: Secondary | ICD-10-CM

## 2012-05-03 MED ORDER — METHYLPHENIDATE HCL ER (OSM) 27 MG PO TBCR: 27.0000 mg | EXTENDED_RELEASE_TABLET | ORAL | Status: DC

## 2012-05-03 NOTE — Telephone Encounter (Signed)
Left voicemail letting pt know Rx ready for pick up 

## 2012-05-03 NOTE — Telephone Encounter (Signed)
Pt left /vm requesting rx concerta. Call when ready for pick up. 

## 2012-05-03 NOTE — Telephone Encounter (Signed)
Px printed for pick up in IN box  

## 2012-05-09 ENCOUNTER — Ambulatory Visit (INDEPENDENT_AMBULATORY_CARE_PROVIDER_SITE_OTHER): Payer: Medicare Other

## 2012-05-09 DIAGNOSIS — J309 Allergic rhinitis, unspecified: Secondary | ICD-10-CM

## 2012-05-13 DIAGNOSIS — H35359 Cystoid macular degeneration, unspecified eye: Secondary | ICD-10-CM | POA: Insufficient documentation

## 2012-05-16 ENCOUNTER — Ambulatory Visit (INDEPENDENT_AMBULATORY_CARE_PROVIDER_SITE_OTHER): Payer: Medicare Other

## 2012-05-16 DIAGNOSIS — J309 Allergic rhinitis, unspecified: Secondary | ICD-10-CM

## 2012-05-23 ENCOUNTER — Ambulatory Visit (INDEPENDENT_AMBULATORY_CARE_PROVIDER_SITE_OTHER): Payer: Medicare Other

## 2012-05-23 DIAGNOSIS — J309 Allergic rhinitis, unspecified: Secondary | ICD-10-CM

## 2012-06-02 ENCOUNTER — Ambulatory Visit (INDEPENDENT_AMBULATORY_CARE_PROVIDER_SITE_OTHER): Payer: Medicare Other

## 2012-06-02 DIAGNOSIS — J309 Allergic rhinitis, unspecified: Secondary | ICD-10-CM

## 2012-06-06 ENCOUNTER — Ambulatory Visit: Payer: Medicare Other

## 2012-06-08 ENCOUNTER — Ambulatory Visit (INDEPENDENT_AMBULATORY_CARE_PROVIDER_SITE_OTHER): Payer: Medicare Other

## 2012-06-08 DIAGNOSIS — J309 Allergic rhinitis, unspecified: Secondary | ICD-10-CM

## 2012-06-13 ENCOUNTER — Encounter: Payer: Self-pay | Admitting: Internal Medicine

## 2012-06-13 ENCOUNTER — Ambulatory Visit (INDEPENDENT_AMBULATORY_CARE_PROVIDER_SITE_OTHER): Payer: Medicare Other | Admitting: Internal Medicine

## 2012-06-13 ENCOUNTER — Ambulatory Visit (INDEPENDENT_AMBULATORY_CARE_PROVIDER_SITE_OTHER): Payer: Medicare Other

## 2012-06-13 VITALS — BP 124/80 | HR 74 | Ht 67.0 in | Wt 194.8 lb

## 2012-06-13 DIAGNOSIS — J309 Allergic rhinitis, unspecified: Secondary | ICD-10-CM

## 2012-06-13 DIAGNOSIS — G4733 Obstructive sleep apnea (adult) (pediatric): Secondary | ICD-10-CM

## 2012-06-13 NOTE — Progress Notes (Signed)
Patient ID: [name], female    DOB: 02-24-1965, 47 y.o.   MRN: [id number]  HPI 27 yoF followed for obstructive sleep apnea and allergic rhinitis, complicated by insomnia, GERD, depression. Last here March 9 when we gave depo shot which made her much better. She was able to go back to full time use of her CPAP machine - 11 cwp Advanced. We discussed her meds. She feels well today.   11/06/10- 59 yoF followed for obstructive sleep apnea and allergic rhinitis, complicated by insomnia, GERD, depression. Has had flu vaccine. Had sustained head congestion and productive cough one month ago. Her primary physician treated Augmentin and cough syrup. She was clear after that until 4 days ago. One day after her flu shot, she woke with scratchy throat, no fever, increased head congestion.Unable to use CPAP while her head is congested.her ophthalmologist has asked her to get allergy testing done.  Years ago, allergy testing was positive and she was on allergy vaccine for a while. She does not remember details or response.  01/07/11-  33 yoF followed for obstructive sleep apnea and allergic rhinitis, complicated by insomnia, GERD, depression. Comes today for allergy skin testing off antihistamines. No recent respiratory infections. Wheeze very occasionally with exertion.  Anticipates seasonal flare as February pollens start. Had flu vaccine. Uses CPAP regularly "as long as I can breathe". Allergy Skin tests- significant positive reactions for numerous, and inhalant allergens, particularly grass weed and tree pollens, dust mite, cat and some molds. She wishes to start allergy vaccine.  03/12/11-  46 yoF followed for obstructive sleep apnea and allergic rhinitis, complicated by insomnia, GERD, depression. Wearing CPAP less consistently in the last 2 weeks because of nasal congestion. Building allergy vaccine without problems. Taking Allegra-D plus Sudafed. We discussed this. Using saline nasal rinse. Continues  Advair 250. Using rescue inhaler about twice a week.  07/13/11- 46 yoF followed for obstructive sleep apnea and allergic rhinitis, complicated by insomnia, GERD, depression. Having reactions in left arm only. We are holding her allergy vaccine at 1:50/0.4 mL. CPAP 11/Advanced-working very well for her. She describes good compliance and control with no problems.  10/13/11- 73 yoF followed for obstructive sleep apnea and allergic rhinitis, complicated by insomnia, GERD, depression. On vaccine(no reaction for about 3 wks and then started today)itchy red area.  Has had flu vaccine. Holding at 1:50, 0.3 mL's. She does say she considers herself better off on allergy vaccine. Her new eye doctor asked her to see me with question of whether allergic sinus problems could be related to 3 episodes of iritis. She is now on steroid eyedrops. She continues daily Zyrtec and Singulair with Benadryl at night.  02/15/12-46 yoF followed for obstructive sleep apnea and allergic rhinitis, complicated by insomnia, GERD, depression. FOLLOWS FOR: on allergy vaccine 1:50 0.3 mL GH and still having slight reactions; got shot today and slight redness around injection site. Minor local reaction occasionally to her allergy shots. She mentions these when asked but says they don't bother her at all. Still occasional episode of iritis, one eye or the other, with no pattern. Continues doing well with CPAP 11/Advanced used all night every night. CXR 11/02/11 IMPRESSION:  Negative  Original Report Authenticated By: Truett Perna, M.D.  06/13/12- 46 yoF followed for obstructive sleep apnea and allergic rhinitis, complicated by insomnia, GERD, depression. On allergy on vaccine.  No problems at this time.   Wearing cpap11/Advanced everynight for approx 6-8 hours.  No problems with mask  or pressure. Had local reaction after reaching vaccine concentration 1:10 GH, so back down to 1:50 for now. We discussed risk benefit and management of  reactions. Chest replacement for CPAP machine which has developed an odor. She also needs a chin strap because her mouth dries out when wearing nasal CPAP.   Review of Systems- see HPI Constitutional:   No-   weight loss, night sweats, fevers, chills, fatigue, lassitude. HEENT:   No-  headaches, difficulty swallowing, tooth/dental problems, sore throat,       No- sneezing, itching, ear ache, +nasal congestion, little post nasal drip,  CV:  No-   chest pain, orthopnea, PND, swelling in lower extremities, anasarca, dizziness, palpitations Resp: No-   shortness of breath with exertion or at rest.              No-   productive cough, occasional non-productive cough,  No- coughing up of blood.              No-   change in color of mucus.  No- wheezing.   Skin: No-   rash or lesions. GI:  No-   heartburn, indigestion, abdominal pain, nausea, vomiting,  GU: MS:   Neuro-     nothing unusual Psych:  No- change in mood or affect. No depression or anxiety.  No memory loss.  Objective:   Physical Exam General- Alert, Oriented, Affect-appropriate, Distress- none acute Skin- +keratosis pilaris Lymphadenopathy- none Head- atraumatic            Eyes- Gross vision intact, PERRLA, conjunctivae clear secretions            Ears- Hearing, canals-normal            Nose- no-mucus bridging, turinate edema, +Septal dev,no- polyps, erosion, perforation             Throat- Mallampati II-III , mucosa clear , drainage- none, tonsils- atrophic Neck- flexible , trachea midline, no stridor , thyroid nl, carotid no bruit Chest - symmetrical excursion , unlabored           Heart/CV- RRR , no murmur , no gallop  , no rub, nl s1 s2                           - JVD- none , edema- none, stasis changes- none, varices- none           Lung- clear to P&A, wheeze- none, cough- none  , dullness-none, rub- none           Chest wall-  Abd-  Br/ Gen/ Rectal- Not done, not indicated Extrem- cyanosis- none, clubbing, none,  atrophy- none, strength- nl Neuro- grossly intact to observation

## 2012-06-13 NOTE — Patient Instructions (Addendum)
Order- DME [facility]- replacement CPAP machine 11, humidifier     Old machine is wearing out                 Dx OSA                                        Replacement mask of choice and supplies as needed                                        Chin strap  We can continue allergy vaccine  Please call as needed

## 2012-06-14 ENCOUNTER — Other Ambulatory Visit: Payer: Self-pay | Admitting: Family Medicine

## 2012-06-14 MED ORDER — METHYLPHENIDATE HCL ER (OSM) 27 MG PO TBCR: 27.0000 mg | EXTENDED_RELEASE_TABLET | ORAL | Status: DC

## 2012-06-14 NOTE — Telephone Encounter (Signed)
Px printed for pick up in IN box  

## 2012-06-14 NOTE — Telephone Encounter (Signed)
Pt notified Rx ready for pickup 

## 2012-06-20 ENCOUNTER — Ambulatory Visit (INDEPENDENT_AMBULATORY_CARE_PROVIDER_SITE_OTHER): Payer: Medicare Other

## 2012-06-20 DIAGNOSIS — J309 Allergic rhinitis, unspecified: Secondary | ICD-10-CM

## 2012-06-24 ENCOUNTER — Encounter (INDEPENDENT_AMBULATORY_CARE_PROVIDER_SITE_OTHER): Payer: Medicare Other | Admitting: Ophthalmology

## 2012-06-24 ENCOUNTER — Encounter: Payer: Self-pay | Admitting: Family Medicine

## 2012-06-24 DIAGNOSIS — H43819 Vitreous degeneration, unspecified eye: Secondary | ICD-10-CM

## 2012-06-24 DIAGNOSIS — H2510 Age-related nuclear cataract, unspecified eye: Secondary | ICD-10-CM

## 2012-06-24 DIAGNOSIS — H35359 Cystoid macular degeneration, unspecified eye: Secondary | ICD-10-CM

## 2012-06-26 NOTE — Assessment & Plan Note (Signed)
Good compliance and control. She is directed to her home care company to discuss replacing her machine. She will also ask about a chin strap

## 2012-06-27 ENCOUNTER — Encounter: Payer: Self-pay | Admitting: *Deleted

## 2012-06-27 ENCOUNTER — Ambulatory Visit (INDEPENDENT_AMBULATORY_CARE_PROVIDER_SITE_OTHER)
Admission: RE | Admit: 2012-06-27 | Discharge: 2012-06-27 | Disposition: A | Discharge status: Home / Self Care | Payer: Medicare Other | Source: Ambulatory Visit | Attending: Family Medicine | Admitting: Family Medicine

## 2012-06-27 ENCOUNTER — Ambulatory Visit (INDEPENDENT_AMBULATORY_CARE_PROVIDER_SITE_OTHER): Payer: Medicare Other

## 2012-06-27 ENCOUNTER — Ambulatory Visit (INDEPENDENT_AMBULATORY_CARE_PROVIDER_SITE_OTHER): Payer: Medicare Other | Admitting: Family Medicine

## 2012-06-27 ENCOUNTER — Encounter: Payer: Self-pay | Admitting: Family Medicine

## 2012-06-27 VITALS — BP 120/60 | HR 86 | Temp 98.8°F | Ht 67.0 in | Wt 194.0 lb

## 2012-06-27 DIAGNOSIS — J309 Allergic rhinitis, unspecified: Secondary | ICD-10-CM

## 2012-06-27 DIAGNOSIS — M549 Dorsalgia, unspecified: Secondary | ICD-10-CM

## 2012-06-27 MED ORDER — PREDNISONE 20 MG PO TABS: ORAL_TABLET | ORAL | Status: DC

## 2012-06-27 MED ORDER — HYDROCODONE-ACETAMINOPHEN 5-325 MG PO TABS: 1.0000 | ORAL_TABLET | Freq: Four times a day (QID) | ORAL | Status: DC | PRN

## 2012-06-27 NOTE — Progress Notes (Signed)
Patient Name: [name] Date of Birth: 07/19/1965 Medical Record Number: [id number] Gender: female  PCP: Roxy Manns, MD  History of Present Illness:  [name] is a 47 y.o. very pleasant female patient who presents with the following: Back Pain  ongoing for approximately: 6 weeks The patient has had back pain before. > 10 years The back pain is localized into the lumbar spine area. They also describe no radiculopathy. Acute on chronic back pain: Has had some back pain off and on, and had a flare up in the last 6 weeks, and still has a lot of pain today. Still is hurting to breathe.   Has had a series of 3 ESI, has been several years. 5-8 years.  Remains in lumbar area.  Tried ice, heat, tramadol, flexeril.   More painful than childbirth 10/10  No numbness or tingling. No bowel or bladder incontinence. No focal weakness. Prior interventions: ESI, no surgery Heat or cold: Minimal effect  Past Medical History, Surgical History, Family History, Medications, Allergies have been reviewed and updated if relevant.  Review of Systems  GEN: No fevers, chills. Nontoxic. Primarily MSK c/o today. MSK: Detailed in the HPI GI: tolerating PO intake without difficulty Neuro: As above  Otherwise the pertinent positives of the ROS are noted above.    Physical Exam  Filed Vitals:   06/27/12 1204  BP: 120/60  Pulse: 86  Temp: 98.8 F (37.1 C)  TempSrc: Oral  Height: 5\' 7"  (1.702 m)  Weight: 194 lb (87.998 kg)  SpO2: 97%    Gen: Well-developed,well-nourished,in no acute distress; alert,appropriate and cooperative throughout examination HEENT: Normocephalic and atraumatic without obvious abnormalities.  Ears, externally no deformities Pulm: Breathing comfortably in no respiratory distress Range of motion at  the waist: Flexion, rotation and lateral bending: flexion to 40 deg, minimal ext and lat bending.  No echymosis or edema Rises to examination table with no  difficulty Gait: minimally antalgic  Inspection/Deformity: No abnormality Paraspinus T:  L1-s1 b  B Ankle Dorsiflexion (L5,4): 5/5 B Great Toe Dorsiflexion (L5,4): 5/5 Heel Walk (L5): WNL Toe Walk (S1): WNL Rise/Squat (L4): WNL, mild pain  SENSORY B Medial Foot (L4): WNL B Dorsum (L5): WNL B Lateral (S1): WNL Light Touch: WNL Pinprick: WNL  REFLEXES Knee (L4): 2+ Ankle (S1): 2+  B SLR, seated: neg B SLR, supine: back pain only B FABER: neg B Reverse FABER: neg B Greater Troch: NT B Log Roll: neg B Sciatic Notch: NT   Assessment and Plan:  Back pain  ? Higher disk herniation, 10/10 pain, check xr, steroids, pain meds   Acute back pain - Plan: DG Lumbar Spine Complete  Orders Today:  Orders Placed This Encounter  Procedures  . DG Lumbar Spine Complete    Standing Status: Future     Number of Occurrences: 1     Standing Expiration Date: 08/27/2013    Order Specific Question:  Preferred imaging location?    Answer:  [facility]    Order Specific Question:  Reason for exam:    Answer:  acute on chronic back pain    Updated Medication List: (Includes new medications, updates to list, dose adjustments) Meds ordered this encounter  Medications  . brimonidine (ALPHAGAN) 0.15 % ophthalmic solution    Sig:   . cyclobenzaprine (FLEXERIL) 10 MG tablet    Sig: Take 10 mg by mouth.  Marland Kitchen HYDROcodone-acetaminophen (NORCO/VICODIN) 5-325 MG per tablet    Sig: Take 1 tablet by mouth  every 6 (six) hours as needed for pain.    Dispense:  40 tablet    Refill:  0  . predniSONE (DELTASONE) 20 MG tablet    Sig: 2 tabs po daily for 1 week, then 1 tab po daily    Dispense:  21 tablet    Refill:  0    Medications Discontinued: There are no discontinued medications.

## 2012-06-28 ENCOUNTER — Encounter: Payer: Self-pay | Admitting: Family Medicine

## 2012-07-04 ENCOUNTER — Ambulatory Visit: Payer: Medicare Other

## 2012-07-06 ENCOUNTER — Ambulatory Visit (INDEPENDENT_AMBULATORY_CARE_PROVIDER_SITE_OTHER): Payer: Medicare Other

## 2012-07-06 DIAGNOSIS — J309 Allergic rhinitis, unspecified: Secondary | ICD-10-CM

## 2012-07-11 ENCOUNTER — Encounter: Payer: Self-pay | Admitting: Family Medicine

## 2012-07-11 ENCOUNTER — Ambulatory Visit (INDEPENDENT_AMBULATORY_CARE_PROVIDER_SITE_OTHER): Payer: Medicare Other

## 2012-07-11 DIAGNOSIS — J309 Allergic rhinitis, unspecified: Secondary | ICD-10-CM

## 2012-07-20 ENCOUNTER — Ambulatory Visit (INDEPENDENT_AMBULATORY_CARE_PROVIDER_SITE_OTHER): Payer: Medicare Other

## 2012-07-20 DIAGNOSIS — J309 Allergic rhinitis, unspecified: Secondary | ICD-10-CM

## 2012-07-25 ENCOUNTER — Ambulatory Visit (INDEPENDENT_AMBULATORY_CARE_PROVIDER_SITE_OTHER): Payer: Medicare Other

## 2012-07-25 ENCOUNTER — Ambulatory Visit (INDEPENDENT_AMBULATORY_CARE_PROVIDER_SITE_OTHER): Payer: Self-pay | Admitting: Family Medicine

## 2012-07-25 ENCOUNTER — Encounter: Payer: Self-pay | Admitting: Family Medicine

## 2012-07-25 VITALS — BP 122/80 | HR 92 | Temp 98.5°F | Ht 67.0 in | Wt 197.8 lb

## 2012-07-25 DIAGNOSIS — M549 Dorsalgia, unspecified: Secondary | ICD-10-CM

## 2012-07-25 DIAGNOSIS — J309 Allergic rhinitis, unspecified: Secondary | ICD-10-CM

## 2012-07-25 NOTE — Progress Notes (Signed)
[facility] [address] [place] [address] Phone: [phone] Fax: [phone]  Date:  07/25/2012   Name:  [name]   DOB:  1965-08-29   MRN:  [id number] Gender: female Age: 47 y.o.  Primary Physician:  Roxy Manns, MD  Evaluating MD: Hannah Beat, MD   Chief Complaint: Follow-up   History of Present Illness:  [name] is a 47 y.o. pleasant patient who presents with the following:  > 50% better now compared to last OV. Functional right now with her ADL's. No numbness, weakness, or incontinence. Taking skelaxin and tramadol prn. Has had ESI in the past. Also with baseline fibromyalgia.  Patient Active Problem List   Diagnosis Date Noted  . URI (upper respiratory infection) 04/07/2012  . Hyperglycemia 03/23/2012  . Macular hole 03/23/2012  . Screening-pulmonary TB 03/23/2012  . Routine general medical examination at a health care facility 10/02/2011  . Iritis, recurrent 10/02/2011  . Tachycardia 10/02/2011  . Other screening mammogram 10/02/2011  . ADD (attention deficit disorder) 08/20/2010  . DYSHIDROTIC ECZEMA, HANDS 11/12/2009  . OBSTRUCTIVE SLEEP APNEA 11/19/2006  . DEPRESSION 09/29/2006  . HYPERTENSION 09/29/2006  . Seasonal and perennial allergic rhinitis 09/29/2006  . ASTHMA 09/29/2006  . GERD 09/29/2006  . IBS 09/29/2006  . ENDOMETRIOSIS 09/29/2006  . FEMALE INFERTILITY 09/29/2006  . FIBROMYALGIA 09/29/2006  . INSOMNIA 09/29/2006    Past Medical History  Diagnosis Date  . Allergy     allergic rhinitis  . Asthma   . Depression   . GERD (gastroesophageal reflux disease)   . Hypertension   . Fibromyalgia   . Degenerative disc disease   . Sleep apnea   . Fx of fibula 2009  . IBS (irritable bowel syndrome)   . Chronic headaches   . Hemorrhoids   . Iritis, chronic   . Macular hole of right eye     Past Surgical History  Procedure Laterality Date  . Laparoscopy  08/2004    endometriosis - several    . Breast surgery      left nipple removed - benign tumor  . Anterior cruciate ligament repair  1996    small tear of ACL  . Bunionectomy    . Tonsillectomy    . Abdominal hysterectomy      History   Social History  . Marital Status: Married    Spouse Name: N/A    Number of Children: 1  . Years of Education: N/A   Occupational History  . Disabled due to fibromyalgia    Social History Main Topics  . Smoking status: Never Smoker   . Smokeless tobacco: Never Used  . Alcohol Use: No  . Drug Use: No  . Sexually Active: Not on file   Other Topics Concern  . Not on file   Social History Narrative   1 caffeine drink daily     Family History  Problem Relation Age of Onset  . Hypertension Mother   . Thyroid disease Mother   . Fibromyalgia Mother   . Diabetes Father   . Kidney cancer Maternal Grandmother   . Colon cancer Neg Hx   . Colon polyps Mother     Allergies  Allergen Reactions  . Cetirizine Hcl     REACTION: not effective  . Erythromycin     REACTION: stomach hurts  . Escitalopram Oxalate     REACTION: no improvement  . Lisinopril     REACTION: cough  . Loratadine  REACTION: not effective  . Nifedipine     REACTION: fatigued and tremor  . Sertraline Hcl     REACTION: no help  . Vyvanse (Lisdexamfetamine Dimesylate)     Headache and excitablility    Medication list has been reviewed and updated.  Outpatient Prescriptions Prior to Visit  Medication Sig Dispense Refill  . acetaminophen (TYLENOL) 500 MG tablet OTC as directed.       Marland Kitchen albuterol (PROAIR HFA) 108 (90 BASE) MCG/ACT inhaler Inhale 2 puffs into the lungs every 4 (four) hours as needed for wheezing. for wheezing.  3 Inhaler  3  . brimonidine (ALPHAGAN) 0.15 % ophthalmic solution       . buPROPion (WELLBUTRIN XL) 300 MG 24 hr tablet Take 1 tablet (300 mg total) by mouth daily.  90 tablet  3  . carvedilol (COREG) 6.25 MG tablet Take 1 tablet (6.25 mg total) by mouth 2 (two) times daily  with a meal.  60 tablet  11  . diphenhydrAMINE (BENADRYL) 25 mg capsule OTC as directed.       . Fluticasone-Salmeterol (ADVAIR) 250-50 MCG/DOSE AEPB as needed. Rinse mouth. Use 1 puff, twice daily      . methocarbamol (ROBAXIN) 500 MG tablet Take 1 tablet (500 mg total) by mouth at bedtime as needed.  30 tablet  5  . methylphenidate (CONCERTA) 27 MG CR tablet Take 1 tablet (27 mg total) by mouth every morning.  30 tablet  0  . montelukast (SINGULAIR) 10 MG tablet 1 by mouth once daily for asthma and allergy symptoms.  90 tablet  3  . olmesartan (BENICAR) 40 MG tablet Take 1 tablet (40 mg total) by mouth daily.  90 tablet  3  . Omega-3 Fatty Acids (FISH OIL PO) Take 1 capsule by mouth 2 (two) times daily.      . prednisoLONE acetate (PRED FORTE) 1 % ophthalmic suspension Place 1 drop into both eyes 2 (two) times daily.       . Pseudoeph-Doxylamine-DM-APAP (NYQUIL PO) Take by mouth as needed.       . traMADol (ULTRAM) 50 MG tablet Take 1 tablet (50 mg total) by mouth every 8 (eight) hours as needed for pain.  30 tablet  3  . cyclobenzaprine (FLEXERIL) 10 MG tablet Take 10 mg by mouth.      Marland Kitchen HYDROcodone-acetaminophen (NORCO/VICODIN) 5-325 MG per tablet Take 1 tablet by mouth every 6 (six) hours as needed for pain.  40 tablet  0  . predniSONE (DELTASONE) 20 MG tablet 2 tabs po daily for 1 week, then 1 tab po daily  21 tablet  0   No facility-administered medications prior to visit.    Review of Systems:   GEN: No fevers, chills. Nontoxic. Primarily MSK c/o today. MSK: Detailed in the HPI GI: tolerating PO intake without difficulty Neuro: No numbness, parasthesias, or tingling associated. Otherwise the pertinent positives of the ROS are noted above.    Physical Examination: BP 122/80  Pulse 92  Temp(Src) 98.5 F (36.9 C) (Oral)  Ht 5\' 7"  (1.702 m)  Wt 197 lb 12 oz (89.699 kg)  BMI 30.96 kg/m2  LMP 08/05/2006  Ideal Body Weight: Weight in (lb) to have BMI = 25: 159.3   GEN:  Well-developed,well-nourished,in no acute distress; alert,appropriate and cooperative throughout examination HEENT: Normocephalic and atraumatic without obvious abnormalities. Ears, externally no deformities PULM: Breathing comfortably in no respiratory distress EXT: No clubbing, cyanosis, or edema PSYCH: Normally interactive. Cooperative during the interview. Pleasant. Friendly and  conversant. Not anxious or depressed appearing. Normal, full affect.  Range of motion at  the waist: Flexion: modest restriction Extension: mild restriction Lateral bending: normal Rotation: all normal  No echymosis or edema Rises to examination table with no difficulty Gait: non antalgic  Inspection/Deformity: N Paraspinus Tenderness: mild l2-s1  B Ankle Dorsiflexion (L5,4): 5/5 B Great Toe Dorsiflexion (L5,4): 5/5 Heel Walk (L5): WNL Toe Walk (S1): WNL Rise/Squat (L4): WNL  SENSORY B Medial Foot (L4): WNL B Dorsum (L5): WNL B Lateral (S1): WNL Light Touch: WNL  REFLEXES Knee (L4): 2+ Ankle (S1): 2+  B SLR, seated: neg B SLR, supine: neg B Greater Troch: NT B Log Roll: neg B Stork: NT B Sciatic Notch: NT   Assessment and Plan: Acute back pain   Seems to be improving. Discussed cons vs aggressive options, and for now she would like to be more conservative. Start chiropractor - reasonable idea. Massage also a good idea. F/u 6-8 weeks if persistent probs.  Signed, Elpidio Galea. Atul Delucia, MD 07/25/2012 9:40 AM

## 2012-08-01 ENCOUNTER — Ambulatory Visit (INDEPENDENT_AMBULATORY_CARE_PROVIDER_SITE_OTHER): Payer: Medicare Other

## 2012-08-01 DIAGNOSIS — J309 Allergic rhinitis, unspecified: Secondary | ICD-10-CM

## 2012-08-15 ENCOUNTER — Ambulatory Visit: Payer: Medicare Other

## 2012-08-16 ENCOUNTER — Ambulatory Visit (INDEPENDENT_AMBULATORY_CARE_PROVIDER_SITE_OTHER): Payer: Medicare Other

## 2012-08-16 DIAGNOSIS — J309 Allergic rhinitis, unspecified: Secondary | ICD-10-CM

## 2012-08-18 ENCOUNTER — Other Ambulatory Visit: Payer: Self-pay

## 2012-08-18 NOTE — Telephone Encounter (Signed)
Pt left v/m requesting refill concerta. Call when ready for pick up.

## 2012-08-19 MED ORDER — METHYLPHENIDATE HCL ER (OSM) 27 MG PO TBCR: 27.0000 mg | EXTENDED_RELEASE_TABLET | ORAL | Status: DC

## 2012-08-19 NOTE — Telephone Encounter (Signed)
Left voicemail letting pt know Rx ready for pick up 

## 2012-08-19 NOTE — Telephone Encounter (Signed)
Px printed for pick up in IN box  

## 2012-08-23 ENCOUNTER — Ambulatory Visit: Payer: Medicare Other

## 2012-08-29 ENCOUNTER — Ambulatory Visit (INDEPENDENT_AMBULATORY_CARE_PROVIDER_SITE_OTHER): Payer: Medicare Other

## 2012-08-29 DIAGNOSIS — J309 Allergic rhinitis, unspecified: Secondary | ICD-10-CM

## 2012-09-07 ENCOUNTER — Ambulatory Visit: Payer: Medicare Other

## 2012-09-12 ENCOUNTER — Ambulatory Visit (INDEPENDENT_AMBULATORY_CARE_PROVIDER_SITE_OTHER): Payer: Medicare Other

## 2012-09-12 DIAGNOSIS — J309 Allergic rhinitis, unspecified: Secondary | ICD-10-CM

## 2012-09-14 ENCOUNTER — Ambulatory Visit (INDEPENDENT_AMBULATORY_CARE_PROVIDER_SITE_OTHER): Payer: Medicare Other | Admitting: Family Medicine

## 2012-09-14 DIAGNOSIS — Z23 Encounter for immunization: Secondary | ICD-10-CM

## 2012-09-19 ENCOUNTER — Ambulatory Visit (INDEPENDENT_AMBULATORY_CARE_PROVIDER_SITE_OTHER): Payer: Medicare Other

## 2012-09-19 DIAGNOSIS — J309 Allergic rhinitis, unspecified: Secondary | ICD-10-CM

## 2012-09-26 ENCOUNTER — Ambulatory Visit (INDEPENDENT_AMBULATORY_CARE_PROVIDER_SITE_OTHER): Payer: Medicare Other

## 2012-09-26 DIAGNOSIS — J309 Allergic rhinitis, unspecified: Secondary | ICD-10-CM

## 2012-10-03 ENCOUNTER — Ambulatory Visit: Payer: Medicare Other

## 2012-10-06 ENCOUNTER — Ambulatory Visit (INDEPENDENT_AMBULATORY_CARE_PROVIDER_SITE_OTHER): Payer: Medicare Other

## 2012-10-06 DIAGNOSIS — J309 Allergic rhinitis, unspecified: Secondary | ICD-10-CM

## 2012-10-10 ENCOUNTER — Telehealth: Payer: Self-pay | Admitting: Family Medicine

## 2012-10-10 ENCOUNTER — Ambulatory Visit (INDEPENDENT_AMBULATORY_CARE_PROVIDER_SITE_OTHER): Payer: Medicare Other

## 2012-10-10 DIAGNOSIS — Z0000 Encounter for general adult medical examination without abnormal findings: Secondary | ICD-10-CM

## 2012-10-10 DIAGNOSIS — R739 Hyperglycemia, unspecified: Secondary | ICD-10-CM

## 2012-10-10 DIAGNOSIS — J309 Allergic rhinitis, unspecified: Secondary | ICD-10-CM

## 2012-10-10 NOTE — Telephone Encounter (Signed)
Message copied by Judy Pimple on Mon Oct 10, 2012  8:53 AM ------      Message from: Alvina Chou      Created: Fri Sep 30, 2012  4:36 PM      Regarding: Lab orders for, Tuesday 10.7.14       Patient is scheduled for CPX labs, please order future labs, Thanks , Terri       ------

## 2012-10-11 ENCOUNTER — Ambulatory Visit (INDEPENDENT_AMBULATORY_CARE_PROVIDER_SITE_OTHER): Payer: Medicare Other

## 2012-10-11 ENCOUNTER — Other Ambulatory Visit (INDEPENDENT_AMBULATORY_CARE_PROVIDER_SITE_OTHER): Payer: Medicare Other

## 2012-10-11 DIAGNOSIS — I10 Essential (primary) hypertension: Secondary | ICD-10-CM

## 2012-10-11 DIAGNOSIS — R739 Hyperglycemia, unspecified: Secondary | ICD-10-CM

## 2012-10-11 DIAGNOSIS — J309 Allergic rhinitis, unspecified: Secondary | ICD-10-CM

## 2012-10-11 DIAGNOSIS — Z0000 Encounter for general adult medical examination without abnormal findings: Secondary | ICD-10-CM

## 2012-10-11 DIAGNOSIS — R7309 Other abnormal glucose: Secondary | ICD-10-CM

## 2012-10-11 LAB — COMPREHENSIVE METABOLIC PANEL
BUN: 10 mg/dL (ref 6–23)
CO2: 28 mEq/L (ref 19–32)
GFR: 70.06 mL/min (ref >60.00)
Glucose, Bld: 104 mg/dL — ABNORMAL HIGH (ref 70–99)
Sodium: 141 mEq/L (ref 135–145)
Total Bilirubin: 0.5 mg/dL (ref 0.3–1.2)
Total Protein: 7.2 g/dL (ref 6.0–8.3)

## 2012-10-11 LAB — CBC WITH DIFFERENTIAL/PLATELET
Basophils Relative: 0.7 % (ref 0.0–3.0)
Eosinophils Relative: 5.6 % — ABNORMAL HIGH (ref 0.0–5.0)
HCT: 42.3 % (ref 36.0–46.0)
Hemoglobin: 14.3 g/dL (ref 12.0–15.0)
Lymphs Abs: 2.3 10*3/uL (ref 0.7–4.0)
MCV: 84.3 fl (ref 78.0–100.0)
Monocytes Absolute: 0.5 10*3/uL (ref 0.1–1.0)
Monocytes Relative: 6.4 % (ref 3.0–12.0)
Neutro Abs: 4.5 10*3/uL (ref 1.4–7.7)
Platelets: 268 10*3/uL (ref 150.0–400.0)
RBC: 5.02 Mil/uL (ref 3.87–5.11)
WBC: 7.8 10*3/uL (ref 4.5–10.5)

## 2012-10-11 LAB — LIPID PANEL
Cholesterol: 178 mg/dL (ref 0–200)
HDL: 49 mg/dL (ref >39.00)

## 2012-10-11 LAB — TSH: TSH: 2.84 u[IU]/mL (ref 0.35–5.50)

## 2012-10-11 LAB — HEMOGLOBIN A1C: Hgb A1c MFr Bld: 6.6 % — ABNORMAL HIGH (ref 4.6–6.5)

## 2012-10-12 ENCOUNTER — Other Ambulatory Visit: Payer: Self-pay | Admitting: Family Medicine

## 2012-10-12 ENCOUNTER — Other Ambulatory Visit: Payer: Self-pay | Admitting: Internal Medicine

## 2012-10-12 NOTE — Telephone Encounter (Signed)
Electronic refill request, please advise  

## 2012-10-12 NOTE — Telephone Encounter (Signed)
Rxs called in as prescribed  

## 2012-10-12 NOTE — Telephone Encounter (Signed)
Px written for call in   

## 2012-10-17 ENCOUNTER — Ambulatory Visit (INDEPENDENT_AMBULATORY_CARE_PROVIDER_SITE_OTHER): Payer: Medicare Other

## 2012-10-17 DIAGNOSIS — J309 Allergic rhinitis, unspecified: Secondary | ICD-10-CM

## 2012-10-18 ENCOUNTER — Encounter: Payer: Medicare Other | Admitting: Family Medicine

## 2012-10-24 ENCOUNTER — Ambulatory Visit (INDEPENDENT_AMBULATORY_CARE_PROVIDER_SITE_OTHER): Payer: Medicare Other

## 2012-10-24 DIAGNOSIS — J309 Allergic rhinitis, unspecified: Secondary | ICD-10-CM

## 2012-10-31 ENCOUNTER — Ambulatory Visit (INDEPENDENT_AMBULATORY_CARE_PROVIDER_SITE_OTHER): Payer: Medicare Other

## 2012-10-31 DIAGNOSIS — J309 Allergic rhinitis, unspecified: Secondary | ICD-10-CM

## 2012-11-02 ENCOUNTER — Encounter: Payer: Self-pay | Admitting: Family Medicine

## 2012-11-02 ENCOUNTER — Ambulatory Visit (INDEPENDENT_AMBULATORY_CARE_PROVIDER_SITE_OTHER): Payer: Medicare Other | Admitting: Family Medicine

## 2012-11-02 VITALS — BP 122/86 | HR 82 | Temp 98.7°F | Ht 67.0 in | Wt 197.8 lb

## 2012-11-02 DIAGNOSIS — R739 Hyperglycemia, unspecified: Secondary | ICD-10-CM

## 2012-11-02 DIAGNOSIS — Z0000 Encounter for general adult medical examination without abnormal findings: Secondary | ICD-10-CM

## 2012-11-02 DIAGNOSIS — L732 Hidradenitis suppurativa: Secondary | ICD-10-CM

## 2012-11-02 DIAGNOSIS — Z23 Encounter for immunization: Secondary | ICD-10-CM

## 2012-11-02 DIAGNOSIS — R7309 Other abnormal glucose: Secondary | ICD-10-CM

## 2012-11-02 DIAGNOSIS — I10 Essential (primary) hypertension: Secondary | ICD-10-CM

## 2012-11-02 MED ORDER — OLMESARTAN MEDOXOMIL 40 MG PO TABS: 40.0000 mg | ORAL_TABLET | Freq: Every day | ORAL | Status: DC

## 2012-11-02 MED ORDER — BUPROPION HCL ER (XL) 300 MG PO TB24: 300.0000 mg | ORAL_TABLET | Freq: Every day | ORAL | Status: DC

## 2012-11-02 MED ORDER — MONTELUKAST SODIUM 10 MG PO TABS: 10.0000 mg | ORAL_TABLET | Freq: Every day | ORAL | Status: DC

## 2012-11-02 MED ORDER — METHYLPHENIDATE HCL ER (OSM) 27 MG PO TBCR: 27.0000 mg | EXTENDED_RELEASE_TABLET | ORAL | Status: DC

## 2012-11-02 MED ORDER — CARVEDILOL 6.25 MG PO TABS: 6.2500 mg | ORAL_TABLET | Freq: Two times a day (BID) | ORAL | Status: DC

## 2012-11-02 MED ORDER — FLUTICASONE-SALMETEROL 250-50 MCG/DOSE IN AEPB: 1.0000 | INHALATION_SPRAY | RESPIRATORY_TRACT | Status: DC | PRN

## 2012-11-02 NOTE — Progress Notes (Signed)
Subjective:    Patient ID: [name], female    DOB: 12-Dec-1965, 47 y.o.   MRN: [id number]  HPI I have personally reviewed the Medicare Annual Wellness questionnaire and have noted 1. The patient's medical and social history 2. Their use of alcohol, tobacco or illicit drugs 3. Their current medications and supplements 4. The patient's functional ability including ADL's, fall risks, home safety risks and hearing or visual             impairment. 5. Diet and physical activities 6. Evidence for depression or mood disorders  The patients weight, height, BMI have been recorded in the chart and visual acuity is per eye clinic.  I have made referrals, counseling and provided education to the patient based review of the above and I have provided the pt with a written personalized care plan for preventive services.  Still having eye issues /macular hole/ goes to [employer] boils around panty line occ that are painful - they come and go -no flare up now  Also 2 tiny cysts on labia -not bothersome   See scanned forms.  Routine anticipatory guidance given to patient.  See health maintenance. Flu vaccine 9/14 Shingles- too young for  PNA - unsure if she has had one - will get that today Tetanus 7/13 vaccine  Colonoscopy in 4/12 - tics / and recommended for recall at 52  Breast cancer screening mammogram 11/13 - she will make own appt for that in nov Self exam no lumps  Hx of hysterectomy- was not for cancer , was for endometriosis - still has one ovary  Advance directive-she does have a living will  Cognitive function addressed- see scanned forms- and if abnormal then additional documentation follows.   Same as usual with fibro fog-no changes   PMH and SH reviewed  Meds, vitals, and allergies reviewed.   ROS: See HPI.  Otherwise negative.    Hyperglycemia  A1c up from 6.2 to 6.6 Diet- not watching diet at all - she is willing to work on low sugar diet  She does crave sugar and  salt - though she has backed off of soft drinks   Exercise - [facility] [employer] and took a fibromyalgia water class for a month -now her schedule is too difficult , also caring for father after knee repl surgery Knows she needs to loose wt   Wt is stable with bmi of 30- obese    Chemistry      Component Value Date/Time   NA 141 10/11/2012 0920   K 4.4 10/11/2012 0920   CL 107 10/11/2012 0920   CO2 28 10/11/2012 0920   BUN 10 10/11/2012 0920   CREATININE 0.9 10/11/2012 0920   CREATININE 0.96 10/02/2011 1615      Component Value Date/Time   CALCIUM 9.3 10/11/2012 0920   ALKPHOS 80 10/11/2012 0920   AST 26 10/11/2012 0920   ALT 11 10/11/2012 0920   BILITOT 0.5 10/11/2012 0920      Lab Results  Component Value Date   CHOL 178 10/11/2012   CHOL 197 10/02/2011   CHOL 177 03/25/2010   Lab Results  Component Value Date   HDL 49.00 10/11/2012   HDL 56 10/02/2011   HDL 62.30 03/25/2010   Lab Results  Component Value Date   LDLCALC 108* 10/11/2012   LDLCALC 113* 10/02/2011   LDLCALC 104* 03/25/2010   Lab Results  Component Value Date   TRIG 106.0 10/11/2012   TRIG  139 10/02/2011   TRIG 56.0 03/25/2010   Lab Results  Component Value Date   CHOLHDL 4 10/11/2012   CHOLHDL 3.5 10/02/2011   CHOLHDL 3 03/25/2010   No results found for this basename: LDLDIRECT   overall not a bad profile  Will get back to exercise  bp is stable today  No cp or palpitations or headaches or edema  No side effects to medicines  BP Readings from Last 3 Encounters:  11/02/12 122/86  07/25/12 122/80  06/27/12 120/60     In terms of current meds- she will change to [employer] gold supplement - her concerta and ? What will be covered ( vyvanse is intolerated) , ? Tried adderall  Also wellbutrin will cost more    Patient Active Problem List   Diagnosis Date Noted  . Encounter for Medicare annual wellness exam 11/02/2012  . Hyperglycemia 03/23/2012  . Macular hole 03/23/2012  . Screening-pulmonary TB 03/23/2012  .  Routine general medical examination at a health care facility 10/02/2011  . Iritis, recurrent 10/02/2011  . Tachycardia 10/02/2011  . Other screening mammogram 10/02/2011  . ADD (attention deficit disorder) 08/20/2010  . DYSHIDROTIC ECZEMA, HANDS 11/12/2009  . OBSTRUCTIVE SLEEP APNEA 11/19/2006  . DEPRESSION 09/29/2006  . HYPERTENSION 09/29/2006  . Seasonal and perennial allergic rhinitis 09/29/2006  . ASTHMA 09/29/2006  . GERD 09/29/2006  . IBS 09/29/2006  . ENDOMETRIOSIS 09/29/2006  . FEMALE INFERTILITY 09/29/2006  . FIBROMYALGIA 09/29/2006  . INSOMNIA 09/29/2006   Past Medical History  Diagnosis Date  . Allergy     allergic rhinitis  . Asthma   . Depression   . GERD (gastroesophageal reflux disease)   . Hypertension   . Fibromyalgia   . Degenerative disc disease   . Sleep apnea   . Fx of fibula 2009  . IBS (irritable bowel syndrome)   . Chronic headaches   . Hemorrhoids   . Iritis, chronic   . Macular hole of right eye    Past Surgical History  Procedure Laterality Date  . Laparoscopy  08/2004    endometriosis - several   . Breast surgery      left nipple removed - benign tumor  . Anterior cruciate ligament repair  1996    small tear of ACL  . Bunionectomy    . Tonsillectomy    . Abdominal hysterectomy     History  Substance Use Topics  . Smoking status: Never Smoker   . Smokeless tobacco: Never Used  . Alcohol Use: No   Family History  Problem Relation Age of Onset  . Hypertension Mother   . Thyroid disease Mother   . Fibromyalgia Mother   . Diabetes Father   . Kidney cancer Maternal Grandmother   . Colon cancer Neg Hx   . Colon polyps Mother    Allergies  Allergen Reactions  . Cetirizine Hcl     REACTION: not effective  . Erythromycin     REACTION: stomach hurts  . Escitalopram Oxalate     REACTION: no improvement  . Lisinopril     REACTION: cough  . Loratadine     REACTION: not effective  . Nifedipine     REACTION: fatigued and tremor   . Sertraline Hcl     REACTION: no help  . Vyvanse [Lisdexamfetamine Dimesylate]     Headache and excitablility   Current Outpatient Prescriptions on File Prior to Visit  Medication Sig Dispense Refill  . acetaminophen (TYLENOL) 500 MG tablet OTC as directed.       Marland Kitchen  brimonidine (ALPHAGAN) 0.15 % ophthalmic solution Place 1 drop into both eyes daily.       Marland Kitchen buPROPion (WELLBUTRIN XL) 300 MG 24 hr tablet Take 1 tablet (300 mg total) by mouth daily.  90 tablet  3  . carvedilol (COREG) 6.25 MG tablet Take 1 tablet (6.25 mg total) by mouth 2 (two) times daily with a meal.  60 tablet  11  . diphenhydrAMINE (BENADRYL) 25 mg capsule OTC as directed.       . Fluticasone-Salmeterol (ADVAIR) 250-50 MCG/DOSE AEPB as needed. Rinse mouth. Use 1 puff, twice daily      . methocarbamol (ROBAXIN) 500 MG tablet TAKE 1 TABLET (500 MG TOTAL) BY MOUTH AT BEDTIME AS NEEDED.  30 tablet  1  . methylphenidate (CONCERTA) 27 MG CR tablet Take 1 tablet (27 mg total) by mouth every morning.  30 tablet  0  . olmesartan (BENICAR) 40 MG tablet Take 1 tablet (40 mg total) by mouth daily.  90 tablet  3  . Omega-3 Fatty Acids (FISH OIL PO) Take 1 capsule by mouth 2 (two) times daily.      . prednisoLONE acetate (PRED FORTE) 1 % ophthalmic suspension Place 1 drop into both eyes daily.       Marland Kitchen PROAIR HFA 108 (90 BASE) MCG/ACT inhaler INHALE 2 PUFFS EVERY 4 HOURS AS NEEDED FOR WHEEZING  8.5 each  3  . traMADol (ULTRAM) 50 MG tablet TAKE 1 TABLET (50 MG TOTAL) BY MOUTH EVERY 8 (EIGHT) HOURS AS NEEDED FOR PAIN.  30 tablet  1   No current facility-administered medications on file prior to visit.        Review of Systems    Review of Systems  Constitutional: Negative for fever, appetite change, and unexpected weight change. pos for chronic fatigue  Eyes: Negative for pain and visual disturbance.  Respiratory: Negative for cough and shortness of breath.   Cardiovascular: Negative for cp or palpitations    Gastrointestinal:  Negative for nausea, diarrhea and constipation.  Genitourinary: Negative for urgency and frequency.  Skin: Negative for pallor or rash   MSK pos for fibromyalgia pain Neurological: Negative for weakness, light-headedness, numbness and headaches.  Hematological: Negative for adenopathy. Does not bruise/bleed easily.  Psychiatric/Behavioral: Negative for dysphoric mood. The patient is not nervous/anxious.      Objective:   Physical Exam  Constitutional: She appears well-developed and well-nourished. No distress.  obese and well appearing   HENT:  Head: Normocephalic and atraumatic.  Right Ear: External ear normal.  Left Ear: External ear normal.  Nose: Nose normal.  Mouth/Throat: Oropharynx is clear and moist.  Eyes: Conjunctivae and EOM are normal. Pupils are equal, round, and reactive to light. Right eye exhibits no discharge. Left eye exhibits no discharge. No scleral icterus.  Neck: Normal range of motion. Neck supple. No JVD present. No thyromegaly present.  Cardiovascular: Normal rate, regular rhythm, normal heart sounds and intact distal pulses.  Exam reveals no gallop.   Pulmonary/Chest: Effort normal and breath sounds normal. No respiratory distress. She has no wheezes. She has no rales.  Abdominal: Soft. Bowel sounds are normal. She exhibits no distension and no mass. There is no tenderness.  Genitourinary: Vagina normal. There is no rash or tenderness on the right labia. There is no rash or tenderness on the left labia. No vaginal discharge found.  No M on bimanual  Several small cysts/ clogged pores on L labia minora  Musculoskeletal: She exhibits no edema and no tenderness.  Lymphadenopathy:    She has no cervical adenopathy.  Neurological: She is alert. She has normal reflexes. No cranial nerve deficit. She exhibits normal muscle tone. Coordination normal.  Skin: Skin is warm and dry. No rash noted. No erythema. No pallor.  Psychiatric: She has a normal mood and affect.           Assessment & Plan:

## 2012-11-02 NOTE — Patient Instructions (Addendum)
Pneumonia vaccine today Schedule follow up with labs prior for diabetes in 3 months  When you change your insurance in Jan- find out what ADD meds are covered and if short acting wellbutrin is as well Eat a low sugar/ lower carb diet Try to exercise 5 days per week Don't forget to schedule your mammogram  For hydradenitis- use antibacterial soap and water for cleansing

## 2012-11-03 NOTE — Assessment & Plan Note (Signed)
None present today Recommend antibacterial soap and water - disc dx and ways to treat/ prevent Wt loss may help

## 2012-11-03 NOTE — Assessment & Plan Note (Signed)
Lab Results  Component Value Date   HGBA1C 6.6* 10/11/2012    Now in DM range  Will work on diet/ exercise/ wt loss Info given on DM and meal planning  Lab and f/u in 3 mo

## 2012-11-03 NOTE — Assessment & Plan Note (Signed)
BP: 122/86 mmHg   bp in fair control at this time  No changes needed  Disc lifstyle change with low sodium diet and exercise   Lab rev

## 2012-11-03 NOTE — Assessment & Plan Note (Signed)
Reviewed health habits including diet and exercise and skin cancer prevention Also reviewed health mt list, fam hx and immunizations  See HPI Wellness lab reviewed

## 2012-11-07 ENCOUNTER — Ambulatory Visit (INDEPENDENT_AMBULATORY_CARE_PROVIDER_SITE_OTHER): Payer: Medicare Other

## 2012-11-07 DIAGNOSIS — J309 Allergic rhinitis, unspecified: Secondary | ICD-10-CM

## 2012-11-14 ENCOUNTER — Ambulatory Visit (INDEPENDENT_AMBULATORY_CARE_PROVIDER_SITE_OTHER): Payer: Medicare Other

## 2012-11-14 DIAGNOSIS — J309 Allergic rhinitis, unspecified: Secondary | ICD-10-CM

## 2012-11-21 ENCOUNTER — Ambulatory Visit (INDEPENDENT_AMBULATORY_CARE_PROVIDER_SITE_OTHER): Payer: Medicare Other

## 2012-11-21 DIAGNOSIS — J309 Allergic rhinitis, unspecified: Secondary | ICD-10-CM

## 2012-11-28 ENCOUNTER — Ambulatory Visit (INDEPENDENT_AMBULATORY_CARE_PROVIDER_SITE_OTHER): Payer: Medicare Other

## 2012-11-28 DIAGNOSIS — J309 Allergic rhinitis, unspecified: Secondary | ICD-10-CM

## 2012-12-05 ENCOUNTER — Ambulatory Visit (INDEPENDENT_AMBULATORY_CARE_PROVIDER_SITE_OTHER): Payer: Medicare Other

## 2012-12-05 DIAGNOSIS — J309 Allergic rhinitis, unspecified: Secondary | ICD-10-CM

## 2012-12-12 ENCOUNTER — Encounter: Payer: Self-pay | Admitting: Internal Medicine

## 2012-12-12 ENCOUNTER — Ambulatory Visit (INDEPENDENT_AMBULATORY_CARE_PROVIDER_SITE_OTHER): Payer: Medicare Other | Admitting: Internal Medicine

## 2012-12-12 ENCOUNTER — Ambulatory Visit (INDEPENDENT_AMBULATORY_CARE_PROVIDER_SITE_OTHER): Payer: Medicare Other

## 2012-12-12 VITALS — BP 120/88 | HR 93 | Ht 67.0 in | Wt 199.0 lb

## 2012-12-12 DIAGNOSIS — J309 Allergic rhinitis, unspecified: Secondary | ICD-10-CM

## 2012-12-12 DIAGNOSIS — J302 Other seasonal allergic rhinitis: Secondary | ICD-10-CM

## 2012-12-12 DIAGNOSIS — G4733 Obstructive sleep apnea (adult) (pediatric): Secondary | ICD-10-CM

## 2012-12-12 DIAGNOSIS — J45909 Unspecified asthma, uncomplicated: Secondary | ICD-10-CM

## 2012-12-12 DIAGNOSIS — J342 Deviated nasal septum: Secondary | ICD-10-CM

## 2012-12-12 MED ORDER — MONTELUKAST SODIUM 10 MG PO TABS: 10.0000 mg | ORAL_TABLET | Freq: Every day | ORAL | Status: DC

## 2012-12-12 NOTE — Progress Notes (Signed)
Patient ID: [name], female    DOB: 02-24-1965, 47 y.o.   MRN: [id number]  HPI 27 yoF followed for obstructive sleep apnea and allergic rhinitis, complicated by insomnia, GERD, depression. Last here March 9 when we gave depo shot which made her much better. She was able to go back to full time use of her CPAP machine - 11 cwp Advanced. We discussed her meds. She feels well today.   11/06/10- 59 yoF followed for obstructive sleep apnea and allergic rhinitis, complicated by insomnia, GERD, depression. Has had flu vaccine. Had sustained head congestion and productive cough one month ago. Her primary physician treated Augmentin and cough syrup. She was clear after that until 4 days ago. One day after her flu shot, she woke with scratchy throat, no fever, increased head congestion.Unable to use CPAP while her head is congested.her ophthalmologist has asked her to get allergy testing done.  Years ago, allergy testing was positive and she was on allergy vaccine for a while. She does not remember details or response.  01/07/11-  33 yoF followed for obstructive sleep apnea and allergic rhinitis, complicated by insomnia, GERD, depression. Comes today for allergy skin testing off antihistamines. No recent respiratory infections. Wheeze very occasionally with exertion.  Anticipates seasonal flare as February pollens start. Had flu vaccine. Uses CPAP regularly "as long as I can breathe". Allergy Skin tests- significant positive reactions for numerous, and inhalant allergens, particularly grass weed and tree pollens, dust mite, cat and some molds. She wishes to start allergy vaccine.  03/12/11-  46 yoF followed for obstructive sleep apnea and allergic rhinitis, complicated by insomnia, GERD, depression. Wearing CPAP less consistently in the last 2 weeks because of nasal congestion. Building allergy vaccine without problems. Taking Allegra-D plus Sudafed. We discussed this. Using saline nasal rinse. Continues  Advair 250. Using rescue inhaler about twice a week.  07/13/11- 46 yoF followed for obstructive sleep apnea and allergic rhinitis, complicated by insomnia, GERD, depression. Having reactions in left arm only. We are holding her allergy vaccine at 1:50/0.4 mL. CPAP 11/Advanced-working very well for her. She describes good compliance and control with no problems.  10/13/11- 73 yoF followed for obstructive sleep apnea and allergic rhinitis, complicated by insomnia, GERD, depression. On vaccine(no reaction for about 3 wks and then started today)itchy red area.  Has had flu vaccine. Holding at 1:50, 0.3 mL's. She does say she considers herself better off on allergy vaccine. Her new eye doctor asked her to see me with question of whether allergic sinus problems could be related to 3 episodes of iritis. She is now on steroid eyedrops. She continues daily Zyrtec and Singulair with Benadryl at night.  02/15/12-46 yoF followed for obstructive sleep apnea and allergic rhinitis, complicated by insomnia, GERD, depression. FOLLOWS FOR: on allergy vaccine 1:50 0.3 mL GH and still having slight reactions; got shot today and slight redness around injection site. Minor local reaction occasionally to her allergy shots. She mentions these when asked but says they don't bother her at all. Still occasional episode of iritis, one eye or the other, with no pattern. Continues doing well with CPAP 11/Advanced used all night every night. CXR 11/02/11 IMPRESSION:  Negative  Original Report Authenticated By: Truett Perna, M.D.  06/13/12- 46 yoF followed for obstructive sleep apnea and allergic rhinitis, complicated by insomnia, GERD, depression. On allergy on vaccine.  No problems at this time.   Wearing cpap11/Advanced everynight for approx 6-8 hours.  No problems with mask  or pressure. Had local reaction after reaching vaccine concentration 1:10 GH, so back down to 1:50 for now. We discussed risk benefit and management of  reactions. Check replacement for CPAP machine which has developed an odor. She also needs a chin strap because her mouth dries out when wearing nasal CPAP.  12/12/12- 47 yoF followed for obstructive sleep apnea and allergic rhinitis, complicated by insomnia, GERD, depression. FOLLOWS FOR:  Wearing CPAP 11/ Advanced 6-8 hours per night.   Still doing well on Allergy vaccine 1:50 GH She likes her new CPAP machine with nasal pillows mask  Review of Systems- see HPI Constitutional:   No-   weight loss, night sweats, fevers, chills, fatigue, lassitude. HEENT:   No-  headaches, difficulty swallowing, tooth/dental problems, sore throat,       No- sneezing, itching, ear ache, +nasal congestion, little post nasal drip,  CV:  No-   chest pain, orthopnea, PND, swelling in lower extremities, anasarca, dizziness, palpitations Resp: No-   shortness of breath with exertion or at rest.              No-   productive cough, occasional non-productive cough,  No- coughing up of blood.              No-   change in color of mucus.  No- wheezing.   Skin: No-   rash or lesions. GI:  No-   heartburn, indigestion, abdominal pain, nausea, vomiting,  GU: MS:   Neuro-     nothing unusual Psych:  No- change in mood or affect. No depression or anxiety.  No memory loss.  Objective:   Physical Exam General- Alert, Oriented, Affect-appropriate, Distress- none acute Skin- +keratosis pilaris Lymphadenopathy- none Head- atraumatic            Eyes- Gross vision intact, PERRLA, conjunctivae clear secretions            Ears- Hearing, canals-normal            Nose- no-mucus bridging, turinate edema, +Septal dev,no- polyps, erosion, perforation             Throat- Mallampati II-III , mucosa clear , drainage- none, tonsils- atrophic Neck- flexible , trachea midline, no stridor , thyroid nl, carotid no bruit Chest - symmetrical excursion , unlabored           Heart/CV- RRR , no murmur , no gallop  , no rub, nl s1 s2                            - JVD- none , edema- none, stasis changes- none, varices- none           Lung- clear to P&A, wheeze- none, cough- none  , dullness-none, rub- none           Chest wall-  Abd-  Br/ Gen/ Rectal- Not done, not indicated Extrem- cyanosis- none, clubbing, none, atrophy- none, strength- nl Neuro- grossly intact to observation

## 2012-12-12 NOTE — Patient Instructions (Signed)
We can continue CPAP 11/ Advanced  We can continue allergy vaccine 1:10 GH  Please call as needed

## 2012-12-19 ENCOUNTER — Ambulatory Visit (INDEPENDENT_AMBULATORY_CARE_PROVIDER_SITE_OTHER): Payer: Medicare Other

## 2012-12-19 DIAGNOSIS — J309 Allergic rhinitis, unspecified: Secondary | ICD-10-CM

## 2012-12-26 ENCOUNTER — Ambulatory Visit (INDEPENDENT_AMBULATORY_CARE_PROVIDER_SITE_OTHER): Payer: Medicare Other

## 2012-12-26 DIAGNOSIS — J309 Allergic rhinitis, unspecified: Secondary | ICD-10-CM

## 2012-12-27 ENCOUNTER — Encounter: Payer: Self-pay | Admitting: Family Medicine

## 2013-01-03 DIAGNOSIS — J342 Deviated nasal septum: Secondary | ICD-10-CM | POA: Insufficient documentation

## 2013-01-03 NOTE — Assessment & Plan Note (Signed)
Download confirms good compliance and control at 11 CWP

## 2013-01-03 NOTE — Assessment & Plan Note (Signed)
Some deviation of septum and external nose. She does not feel sufficiently obstructed to want surgical evaluation.

## 2013-01-03 NOTE — Assessment & Plan Note (Signed)
Good control. She will continue Singulair.

## 2013-01-03 NOTE — Assessment & Plan Note (Signed)
She is doing well with allergy vaccine at 1:50. We can hold that strength.

## 2013-01-11 ENCOUNTER — Ambulatory Visit (INDEPENDENT_AMBULATORY_CARE_PROVIDER_SITE_OTHER): Payer: Medicare HMO

## 2013-01-11 DIAGNOSIS — J309 Allergic rhinitis, unspecified: Secondary | ICD-10-CM

## 2013-01-16 ENCOUNTER — Ambulatory Visit (INDEPENDENT_AMBULATORY_CARE_PROVIDER_SITE_OTHER): Payer: Medicare HMO

## 2013-01-16 DIAGNOSIS — J309 Allergic rhinitis, unspecified: Secondary | ICD-10-CM

## 2013-01-23 ENCOUNTER — Ambulatory Visit: Payer: Medicare HMO

## 2013-01-24 ENCOUNTER — Ambulatory Visit (INDEPENDENT_AMBULATORY_CARE_PROVIDER_SITE_OTHER): Payer: Medicare HMO

## 2013-01-24 DIAGNOSIS — J309 Allergic rhinitis, unspecified: Secondary | ICD-10-CM

## 2013-01-27 ENCOUNTER — Telehealth: Payer: Self-pay | Admitting: Family Medicine

## 2013-01-27 ENCOUNTER — Other Ambulatory Visit: Payer: Commercial Managed Care - HMO

## 2013-01-27 MED ORDER — SCOPOLAMINE 1 MG/3DAYS TD PT72: 1.0000 | MEDICATED_PATCH | TRANSDERMAL | Status: DC

## 2013-01-27 NOTE — Telephone Encounter (Signed)
I will send these to her pharmacy on file  Watch out for sedation with these  Have a good trip

## 2013-01-27 NOTE — Telephone Encounter (Signed)
Pt is going on a cruise and requests RX for 2 scopolamine patches.

## 2013-01-27 NOTE — Telephone Encounter (Signed)
Left voicemail letting pt know Rx sent to pharmacy and caution of sedation

## 2013-01-30 ENCOUNTER — Ambulatory Visit (INDEPENDENT_AMBULATORY_CARE_PROVIDER_SITE_OTHER): Payer: Medicare HMO

## 2013-01-30 DIAGNOSIS — J309 Allergic rhinitis, unspecified: Secondary | ICD-10-CM

## 2013-02-01 ENCOUNTER — Ambulatory Visit: Payer: Medicare Other | Admitting: Family Medicine

## 2013-02-01 ENCOUNTER — Encounter: Payer: Self-pay | Admitting: Internal Medicine

## 2013-02-06 ENCOUNTER — Ambulatory Visit (INDEPENDENT_AMBULATORY_CARE_PROVIDER_SITE_OTHER): Payer: Medicare HMO

## 2013-02-06 DIAGNOSIS — J309 Allergic rhinitis, unspecified: Secondary | ICD-10-CM

## 2013-02-10 ENCOUNTER — Telehealth: Payer: Self-pay

## 2013-02-10 ENCOUNTER — Ambulatory Visit (INDEPENDENT_AMBULATORY_CARE_PROVIDER_SITE_OTHER): Payer: Medicare HMO | Admitting: Family Medicine

## 2013-02-10 ENCOUNTER — Encounter: Payer: Self-pay | Admitting: Family Medicine

## 2013-02-10 VITALS — BP 110/72 | HR 72 | Temp 98.0°F | Ht 67.0 in | Wt 191.5 lb

## 2013-02-10 DIAGNOSIS — IMO0002 Reserved for concepts with insufficient information to code with codable children: Secondary | ICD-10-CM

## 2013-02-10 MED ORDER — MUPIROCIN 2 % EX OINT: 1.0000 "application " | TOPICAL_OINTMENT | Freq: Three times a day (TID) | CUTANEOUS | Status: DC

## 2013-02-10 MED ORDER — CEPHALEXIN 500 MG PO CAPS
500.0000 mg | ORAL_CAPSULE | Freq: Three times a day (TID) | ORAL | Status: DC
Start: 2013-02-10 — End: 2013-03-01

## 2013-02-10 MED ORDER — SULFAMETHOXAZOLE-TMP DS 800-160 MG PO TABS: 1.0000 | ORAL_TABLET | Freq: Two times a day (BID) | ORAL | Status: DC

## 2013-02-10 NOTE — Telephone Encounter (Signed)
Pharmacist notified to cancel the batrim Rx and fill the keflex instead

## 2013-02-10 NOTE — Telephone Encounter (Signed)
Pt was seen earlier today and forgot to tell Dr Glori Bickers that she is taking Methotrexate 2.5 mg with instructions to take 8 tabs once a week and pt is taking folic acid 1 mg daily. Pt said when tried to fill Bactrim DS was advised interaction between Bactrim and Methotrexate. Also received v/m from [facility] that insurance will not pay for Bactrim due to interaction with Methotrexate. [facility] said if Dr Glori Bickers will not change to a different antibiotic a prior authorization will have to be done by calling ins co for prior auth at [phone].Please advise. Pt request cb. (I updated med list with methotrexate and folic acid).

## 2013-02-10 NOTE — Patient Instructions (Addendum)
Keep finger clean with antibacterial soap and water Warm compress is ok  Use bactroban ointment three times daily Take the keflex as directed If worse or not improving over the week let me know -- if very swollen or red- call immediately

## 2013-02-10 NOTE — Progress Notes (Signed)
Subjective:    Patient ID: [name], female    DOB: [birth year], 48 y.o.   MRN: [id number]  HPI Here with finger pain  L middle  Some redness and swelling around the nail  No pus  No fever   Soaked epsom salt/water Used peroxide  Used neosporin   Patient Active Problem List   Diagnosis Date Noted  . Nasal septal deviation 01/03/2013  . Encounter for Medicare annual wellness exam 11/02/2012  . Hydradenitis 11/02/2012  . Hyperglycemia 03/23/2012  . Macular hole 03/23/2012  . Screening-pulmonary TB 03/23/2012  . Routine general medical examination at a health care facility 10/02/2011  . Iritis, recurrent 10/02/2011  . Tachycardia 10/02/2011  . Other screening mammogram 10/02/2011  . ADD (attention deficit disorder) 08/20/2010  . DYSHIDROTIC ECZEMA, HANDS 11/12/2009  . OBSTRUCTIVE SLEEP APNEA 11/19/2006  . DEPRESSION 09/29/2006  . HYPERTENSION 09/29/2006  . Seasonal and perennial allergic rhinitis 09/29/2006  . ASTHMA 09/29/2006  . GERD 09/29/2006  . IBS 09/29/2006  . ENDOMETRIOSIS 09/29/2006  . FEMALE INFERTILITY 09/29/2006  . FIBROMYALGIA 09/29/2006  . INSOMNIA 09/29/2006   Past Medical History  Diagnosis Date  . Allergy     allergic rhinitis  . Asthma   . Depression   . GERD (gastroesophageal reflux disease)   . Hypertension   . Fibromyalgia   . Degenerative disc disease   . Sleep apnea   . Fx of fibula 2009  . IBS (irritable bowel syndrome)   . Chronic headaches   . Hemorrhoids   . Iritis, chronic   . Macular hole of right eye    Past Surgical History  Procedure Laterality Date  . Laparoscopy  08/2004    endometriosis - several   . Breast surgery      left nipple removed - benign tumor  . Anterior cruciate ligament repair  1996    small tear of ACL  . Bunionectomy    . Tonsillectomy    . Abdominal hysterectomy     History  Substance Use Topics  . Smoking status: Never Smoker   . Smokeless tobacco: Never Used  . Alcohol Use: No    Family History  Problem Relation Age of Onset  . Hypertension Mother   . Thyroid disease Mother   . Fibromyalgia Mother   . Diabetes Father   . Kidney cancer Maternal Grandmother   . Colon cancer Neg Hx   . Colon polyps Mother    Allergies  Allergen Reactions  . Cetirizine Hcl     REACTION: not effective  . Erythromycin     REACTION: stomach hurts  . Escitalopram Oxalate     REACTION: no improvement  . Lisinopril     REACTION: cough  . Loratadine     REACTION: not effective  . Nifedipine     REACTION: fatigued and tremor  . Sertraline Hcl     REACTION: no help  . Vyvanse [Lisdexamfetamine Dimesylate]     Headache and excitablility   Current Outpatient Prescriptions on File Prior to Visit  Medication Sig Dispense Refill  . acetaminophen (TYLENOL) 500 MG tablet OTC as directed.       Marland Kitchen buPROPion (WELLBUTRIN XL) 300 MG 24 hr tablet Take 1 tablet (300 mg total) by mouth daily.  30 tablet  11  . carvedilol (COREG) 6.25 MG tablet Take 1 tablet (6.25 mg total) by mouth 2 (two) times daily with a meal.  60 tablet  11  . diphenhydrAMINE (BENADRYL) 25 mg capsule OTC  as directed.       . Fluticasone-Salmeterol (ADVAIR) 250-50 MCG/DOSE AEPB Inhale 1 puff into the lungs as needed (daily as needed). Rinse mouth. Use 1 puff, twice daily  60 each  1  . methocarbamol (ROBAXIN) 500 MG tablet TAKE 1 TABLET (500 MG TOTAL) BY MOUTH AT BEDTIME AS NEEDED.  30 tablet  1  . montelukast (SINGULAIR) 10 MG tablet Take 1 tablet (10 mg total) by mouth at bedtime. 1 by mouth once daily as needed for asthma and allergy symptoms  30 tablet  prn  . olmesartan (BENICAR) 40 MG tablet Take 1 tablet (40 mg total) by mouth daily.  30 tablet  11  . Omega-3 Fatty Acids (FISH OIL PO) Take 1 capsule by mouth 2 (two) times daily.      . prednisoLONE acetate (PRED FORTE) 1 % ophthalmic suspension Place 1 drop into both eyes daily.       Marland Kitchen PROAIR HFA 108 (90 BASE) MCG/ACT inhaler INHALE 2 PUFFS EVERY 4 HOURS AS  NEEDED FOR WHEEZING  8.5 each  3  . scopolamine (TRANSDERM-SCOP) 1.5 MG Place 1 patch (1.5 mg total) onto the skin every 3 (three) days.  2 patch  0  . traMADol (ULTRAM) 50 MG tablet TAKE 1 TABLET (50 MG TOTAL) BY MOUTH EVERY 8 (EIGHT) HOURS AS NEEDED FOR PAIN.  30 tablet  1   No current facility-administered medications on file prior to visit.      Review of Systems    Review of Systems  Constitutional: Negative for fever, appetite change, fatigue and unexpected weight change.  Eyes: Negative for pain and visual disturbance.  Respiratory: Negative for cough and shortness of breath.   Cardiovascular: Negative for cp or palpitations    Gastrointestinal: Negative for nausea, diarrhea and constipation.  Genitourinary: Negative for urgency and frequency.  Skin: Negative for pallor or rash  pos for redness/ around finger nail MSK neg for joint swelling  Neurological: Negative for weakness, light-headedness, numbness and headaches.  Hematological: Negative for adenopathy. Does not bruise/bleed easily.  Psychiatric/Behavioral: Negative for dysphoric mood. The patient is not nervous/anxious.      Objective:   Physical Exam  Constitutional: She appears well-developed and well-nourished. No distress.  overwt and well appearing   HENT:  Head: Normocephalic and atraumatic.  Eyes: Conjunctivae and EOM are normal. Pupils are equal, round, and reactive to light.  Neck: Normal range of motion. Neck supple.  Cardiovascular: Normal rate and regular rhythm.   Lymphadenopathy:    She has no cervical adenopathy.  Neurological: She is alert. No sensory deficit.  Skin: Skin is warm and dry. There is erythema.  L middle finger - mild redness and swelling of the skin around nail - both sides with ragged cuticle No fluctuance or pallor  Mildly tender  Psychiatric: She has a normal mood and affect.          Assessment & Plan:

## 2013-02-10 NOTE — Progress Notes (Signed)
Pre-visit discussion using our clinic review tool. No additional management support is needed unless otherwise documented below in the visit note.  

## 2013-02-10 NOTE — Telephone Encounter (Signed)
Cancel the bactrim I sent keflex instead Let me know if any problems

## 2013-02-12 NOTE — Assessment & Plan Note (Signed)
Very mild  Pt on methotrexate as well /immunocompromised  Cannot take bactrim as there is pot interaction- will cover with keflex Also bactroban ointment hygeine discussed S/s of worsening infx disc -will call or f/u

## 2013-02-13 ENCOUNTER — Ambulatory Visit (INDEPENDENT_AMBULATORY_CARE_PROVIDER_SITE_OTHER): Payer: Medicare HMO

## 2013-02-13 DIAGNOSIS — J309 Allergic rhinitis, unspecified: Secondary | ICD-10-CM

## 2013-02-17 ENCOUNTER — Other Ambulatory Visit (INDEPENDENT_AMBULATORY_CARE_PROVIDER_SITE_OTHER): Payer: Medicare HMO

## 2013-02-17 DIAGNOSIS — R739 Hyperglycemia, unspecified: Secondary | ICD-10-CM

## 2013-02-17 DIAGNOSIS — R7309 Other abnormal glucose: Secondary | ICD-10-CM

## 2013-02-17 LAB — HEMOGLOBIN A1C: Hgb A1c MFr Bld: 6.2 % (ref 4.6–6.5)

## 2013-02-27 ENCOUNTER — Ambulatory Visit (INDEPENDENT_AMBULATORY_CARE_PROVIDER_SITE_OTHER): Payer: Commercial Managed Care - HMO

## 2013-02-27 DIAGNOSIS — J309 Allergic rhinitis, unspecified: Secondary | ICD-10-CM

## 2013-02-28 ENCOUNTER — Ambulatory Visit: Payer: Commercial Managed Care - HMO | Admitting: Family Medicine

## 2013-03-01 ENCOUNTER — Ambulatory Visit (INDEPENDENT_AMBULATORY_CARE_PROVIDER_SITE_OTHER): Payer: Commercial Managed Care - HMO | Admitting: Family Medicine

## 2013-03-01 ENCOUNTER — Encounter: Payer: Self-pay | Admitting: Family Medicine

## 2013-03-01 VITALS — BP 126/84 | HR 102 | Temp 99.2°F | Ht 67.0 in | Wt 187.5 lb

## 2013-03-01 DIAGNOSIS — IMO0001 Reserved for inherently not codable concepts without codable children: Secondary | ICD-10-CM

## 2013-03-01 DIAGNOSIS — R739 Hyperglycemia, unspecified: Secondary | ICD-10-CM

## 2013-03-01 DIAGNOSIS — I10 Essential (primary) hypertension: Secondary | ICD-10-CM | POA: Diagnosis not present

## 2013-03-01 DIAGNOSIS — R7309 Other abnormal glucose: Secondary | ICD-10-CM

## 2013-03-01 DIAGNOSIS — L988 Other specified disorders of the skin and subcutaneous tissue: Secondary | ICD-10-CM | POA: Diagnosis not present

## 2013-03-01 DIAGNOSIS — R238 Other skin changes: Secondary | ICD-10-CM

## 2013-03-01 NOTE — Progress Notes (Signed)
Subjective:    Patient ID: [name], female    DOB: 1965/03/16, 48 y.o.   MRN: [id number]  HPI Here for f/u of hyperglycemia and HTN  Has been doing ok overall   She needs her paperwork for disability - for fibromyalgia  No changes in her status Still has chronic myofascial pain -that prev her from sitting or standing for long peroids of time Cannot do even sedentary work   Has a spot on her chest- skin/ came up Sunday-was itchy to start with  Looks better than it did   bp is stable today  No cp or palpitations or headaches or edema  No side effects to medicines  BP Readings from Last 3 Encounters:  03/01/13 126/84  02/10/13 110/72  12/12/12 120/88      Lab Results  Component Value Date   HGBA1C 6.2 02/17/2013   down from 6.6  Has cut way back on sugar intake  [facility]   Has cut out sweet tea alltogether   Patient Active Problem List   Diagnosis Date Noted  . Paronychia 02/10/2013  . Nasal septal deviation 01/03/2013  . Encounter for Medicare annual wellness exam 11/02/2012  . Hydradenitis 11/02/2012  . Hyperglycemia 03/23/2012  . Macular hole 03/23/2012  . Screening-pulmonary TB 03/23/2012  . Routine general medical examination at a health care facility 10/02/2011  . Iritis, recurrent 10/02/2011  . Tachycardia 10/02/2011  . Other screening mammogram 10/02/2011  . ADD (attention deficit disorder) 08/20/2010  . DYSHIDROTIC ECZEMA, HANDS 11/12/2009  . OBSTRUCTIVE SLEEP APNEA 11/19/2006  . DEPRESSION 09/29/2006  . HYPERTENSION 09/29/2006  . Seasonal and perennial allergic rhinitis 09/29/2006  . ASTHMA 09/29/2006  . GERD 09/29/2006  . IBS 09/29/2006  . ENDOMETRIOSIS 09/29/2006  . FEMALE INFERTILITY 09/29/2006  . FIBROMYALGIA 09/29/2006  . INSOMNIA 09/29/2006   Past Medical History  Diagnosis Date  . Allergy     allergic rhinitis  . Asthma   . Depression   . GERD (gastroesophageal reflux disease)   .  Hypertension   . Fibromyalgia   . Degenerative disc disease   . Sleep apnea   . Fx of fibula 2009  . IBS (irritable bowel syndrome)   . Chronic headaches   . Hemorrhoids   . Iritis, chronic   . Macular hole of right eye    Past Surgical History  Procedure Laterality Date  . Laparoscopy  08/2004    endometriosis - several   . Breast surgery      left nipple removed - benign tumor  . Anterior cruciate ligament repair  1996    small tear of ACL  . Bunionectomy    . Tonsillectomy    . Abdominal hysterectomy     History  Substance Use Topics  . Smoking status: Never Smoker   . Smokeless tobacco: Never Used  . Alcohol Use: No   Family History  Problem Relation Age of Onset  . Hypertension Mother   . Thyroid disease Mother   . Fibromyalgia Mother   . Diabetes Father   . Kidney cancer Maternal Grandmother   . Colon cancer Neg Hx   . Colon polyps Mother    Allergies  Allergen Reactions  . Cetirizine Hcl     REACTION: not effective  . Erythromycin     REACTION: stomach hurts  . Escitalopram Oxalate     REACTION: no improvement  . Lisinopril     REACTION: cough  .  Loratadine     REACTION: not effective  . Nifedipine     REACTION: fatigued and tremor  . Sertraline Hcl     REACTION: no help  . Vyvanse [Lisdexamfetamine Dimesylate]     Headache and excitablility   Current Outpatient Prescriptions on File Prior to Visit  Medication Sig Dispense Refill  . acetaminophen (TYLENOL) 500 MG tablet OTC as directed.       Marland Kitchen buPROPion (WELLBUTRIN XL) 300 MG 24 hr tablet Take 1 tablet (300 mg total) by mouth daily.  30 tablet  11  . carvedilol (COREG) 6.25 MG tablet Take 1 tablet (6.25 mg total) by mouth 2 (two) times daily with a meal.  60 tablet  11  . diphenhydrAMINE (BENADRYL) 25 mg capsule OTC as directed.       . Fluticasone-Salmeterol (ADVAIR) 250-50 MCG/DOSE AEPB Inhale 1 puff into the lungs as needed (daily as needed). Rinse mouth. Use 1 puff, twice daily  60 each  1    . folic acid (FOLVITE) 1 MG tablet Take 1 mg by mouth daily.      . methocarbamol (ROBAXIN) 500 MG tablet TAKE 1 TABLET (500 MG TOTAL) BY MOUTH AT BEDTIME AS NEEDED.  30 tablet  1  . methotrexate (RHEUMATREX) 2.5 MG tablet Take 20 mg by mouth once a week. Caution:Chemotherapy. Protect from light.      . montelukast (SINGULAIR) 10 MG tablet Take 1 tablet (10 mg total) by mouth at bedtime. 1 by mouth once daily as needed for asthma and allergy symptoms  30 tablet  prn  . mupirocin ointment (BACTROBAN) 2 % Apply 1 application topically 3 (three) times daily. To affected area  15 g  0  . olmesartan (BENICAR) 40 MG tablet Take 1 tablet (40 mg total) by mouth daily.  30 tablet  11  . Omega-3 Fatty Acids (FISH OIL PO) Take 1 capsule by mouth 2 (two) times daily.      . prednisoLONE acetate (PRED FORTE) 1 % ophthalmic suspension Place 1 drop into both eyes daily.       Marland Kitchen PROAIR HFA 108 (90 BASE) MCG/ACT inhaler INHALE 2 PUFFS EVERY 4 HOURS AS NEEDED FOR WHEEZING  8.5 each  3  . scopolamine (TRANSDERM-SCOP) 1.5 MG Place 1 patch (1.5 mg total) onto the skin every 3 (three) days.  2 patch  0  . traMADol (ULTRAM) 50 MG tablet TAKE 1 TABLET (50 MG TOTAL) BY MOUTH EVERY 8 (EIGHT) HOURS AS NEEDED FOR PAIN.  30 tablet  1   No current facility-administered medications on file prior to visit.    Review of Systems Review of Systems  Constitutional: Negative for fever, appetite change, fatigue and unexpected weight change.  Eyes: Negative for pain and visual disturbance.  Respiratory: Negative for cough and shortness of breath.   Cardiovascular: Negative for cp or palpitations    Gastrointestinal: Negative for nausea, diarrhea and constipation.  Genitourinary: Negative for urgency and frequency.  Skin: Negative for pallor or rash   MSK pos for chronic MSK pain from fibromyalgia-limiting her activities  Neurological: Negative for weakness, light-headedness, numbness and headaches.  Hematological: Negative for  adenopathy. Does not bruise/bleed easily.  Psychiatric/Behavioral: Negative for dysphoric mood. The patient is not nervous/anxious.         Objective:   Physical Exam  Constitutional: She appears well-developed and well-nourished. No distress.  overwt and well appearing   HENT:  Head: Normocephalic and atraumatic.  Right Ear: External ear normal.  Left Ear: External  ear normal.  Nose: Nose normal.  Mouth/Throat: Oropharynx is clear and moist.  Eyes: Conjunctivae and EOM are normal. Pupils are equal, round, and reactive to light. Right eye exhibits no discharge. Left eye exhibits no discharge. No scleral icterus.  Neck: Normal range of motion. Neck supple. No JVD present. No thyromegaly present.  Cardiovascular: Normal rate, regular rhythm, normal heart sounds and intact distal pulses.  Exam reveals no gallop.   Pulmonary/Chest: Effort normal and breath sounds normal. No respiratory distress. She has no wheezes. She has no rales.  Abdominal: Soft. Bowel sounds are normal. She exhibits no distension and no mass. There is no tenderness.  Musculoskeletal: She exhibits tenderness. She exhibits no edema.  Pos myofacial trigger points diffusely Limited rom of low back   Lymphadenopathy:    She has no cervical adenopathy.  Neurological: She is alert. She has normal reflexes. No cranial nerve deficit. She exhibits normal muscle tone. Coordination normal.  Skin: Skin is warm and dry. No rash noted. No erythema. No pallor.  Small erythematous papule on R chest wall anteriorly  Psychiatric: She has a normal mood and affect.          Assessment & Plan:

## 2013-03-01 NOTE — Progress Notes (Signed)
Pre visit review using our clinic review tool, if applicable. No additional management support is needed unless otherwise documented below in the visit note. 

## 2013-03-01 NOTE — Patient Instructions (Signed)
Great job with diet and exercise - A1C is improved-keep working on it  I will finish paperwork for you  No change in medicines  Use an antibiotic cream or ointment on the papule on chest - to prevent infection and keep me updated  Blood pressure is good Keep working on weight loss   Follow up in 6 months for annual exam with labs prior

## 2013-03-02 DIAGNOSIS — R238 Other skin changes: Secondary | ICD-10-CM | POA: Insufficient documentation

## 2013-03-02 NOTE — Assessment & Plan Note (Signed)
Improved with better health habits  Lab Results  Component Value Date   HGBA1C 6.2 02/17/2013   Commended  Urged to work on wt loss and stay as active as she can be

## 2013-03-02 NOTE — Assessment & Plan Note (Signed)
BP: 126/84 mmHg  bp in fair control at this time  No changes needed Disc lifstyle change with low sodium diet and exercise   Labs reviewed  

## 2013-03-02 NOTE — Assessment & Plan Note (Addendum)
Status is unchanged Still disabled from work - cannot sit or stand for long peroids of time Deals fairly well with chronic pain  Exercise is limited  Disability forms filled out today

## 2013-03-02 NOTE — Assessment & Plan Note (Signed)
Small papule on R chest looks to be an area of irritation or insect bite inst to use abx ointment-has bactroban Continue to monitor

## 2013-03-03 ENCOUNTER — Telehealth: Payer: Self-pay | Admitting: Family Medicine

## 2013-03-03 NOTE — Telephone Encounter (Signed)
Relevant patient education assigned to patient using Emmi. ° °

## 2013-03-06 ENCOUNTER — Ambulatory Visit (INDEPENDENT_AMBULATORY_CARE_PROVIDER_SITE_OTHER): Payer: Medicare HMO

## 2013-03-06 DIAGNOSIS — J309 Allergic rhinitis, unspecified: Secondary | ICD-10-CM

## 2013-03-07 ENCOUNTER — Ambulatory Visit (INDEPENDENT_AMBULATORY_CARE_PROVIDER_SITE_OTHER): Payer: Medicare HMO

## 2013-03-07 DIAGNOSIS — J309 Allergic rhinitis, unspecified: Secondary | ICD-10-CM

## 2013-03-09 ENCOUNTER — Encounter: Payer: Self-pay | Admitting: Internal Medicine

## 2013-03-09 ENCOUNTER — Ambulatory Visit (INDEPENDENT_AMBULATORY_CARE_PROVIDER_SITE_OTHER): Payer: Medicare HMO | Admitting: Internal Medicine

## 2013-03-09 VITALS — BP 120/80 | HR 97 | Temp 98.5°F | Wt 192.0 lb

## 2013-03-09 DIAGNOSIS — K120 Recurrent oral aphthae: Secondary | ICD-10-CM | POA: Insufficient documentation

## 2013-03-09 MED ORDER — LIDOCAINE HCL 2 % EX GEL
1.0000 | CUTANEOUS | Status: DC | PRN
Start: 2013-03-09 — End: 2014-12-05

## 2013-03-09 NOTE — Assessment & Plan Note (Signed)
She has had "canker sores" in past--not on tongue and not with this much pain Discussed increased ibuprofen dose Lidocaine gel

## 2013-03-09 NOTE — Progress Notes (Signed)
Pre visit review using our clinic review tool, if applicable. No additional management support is needed unless otherwise documented below in the visit note. 

## 2013-03-09 NOTE — Progress Notes (Signed)
Subjective:    Patient ID: [name], female    DOB: 31-Dec-1965, 48 y.o.   MRN: [id number]  HPI Got a bump on the end of her tongue 3 days ago Trying oragel, saline, etc Very painful--actually kept her up No injury---didn't bite her tongue Single lesion  Current Outpatient Prescriptions on File Prior to Visit  Medication Sig Dispense Refill  . acetaminophen (TYLENOL) 500 MG tablet OTC as directed.       Marland Kitchen buPROPion (WELLBUTRIN XL) 300 MG 24 hr tablet Take 1 tablet (300 mg total) by mouth daily.  30 tablet  11  . carvedilol (COREG) 6.25 MG tablet Take 1 tablet (6.25 mg total) by mouth 2 (two) times daily with a meal.  60 tablet  11  . diphenhydrAMINE (BENADRYL) 25 mg capsule OTC as directed.       . Fluticasone-Salmeterol (ADVAIR) 250-50 MCG/DOSE AEPB Inhale 1 puff into the lungs as needed (daily as needed). Rinse mouth. Use 1 puff, twice daily  60 each  1  . folic acid (FOLVITE) 1 MG tablet Take 1 mg by mouth daily.      . methocarbamol (ROBAXIN) 500 MG tablet TAKE 1 TABLET (500 MG TOTAL) BY MOUTH AT BEDTIME AS NEEDED.  30 tablet  1  . methotrexate (RHEUMATREX) 2.5 MG tablet Take 20 mg by mouth once a week. Caution:Chemotherapy. Protect from light.      . montelukast (SINGULAIR) 10 MG tablet Take 1 tablet (10 mg total) by mouth at bedtime. 1 by mouth once daily as needed for asthma and allergy symptoms  30 tablet  prn  . mupirocin ointment (BACTROBAN) 2 % Apply 1 application topically 3 (three) times daily. To affected area  15 g  0  . olmesartan (BENICAR) 40 MG tablet Take 1 tablet (40 mg total) by mouth daily.  30 tablet  11  . Omega-3 Fatty Acids (FISH OIL PO) Take 1 capsule by mouth 2 (two) times daily.      . prednisoLONE acetate (PRED FORTE) 1 % ophthalmic suspension Place 1 drop into both eyes daily.       Marland Kitchen PROAIR HFA 108 (90 BASE) MCG/ACT inhaler INHALE 2 PUFFS EVERY 4 HOURS AS NEEDED FOR WHEEZING  8.5 each  3  . scopolamine (TRANSDERM-SCOP) 1.5 MG Place 1 patch (1.5 mg  total) onto the skin every 3 (three) days.  2 patch  0  . traMADol (ULTRAM) 50 MG tablet TAKE 1 TABLET (50 MG TOTAL) BY MOUTH EVERY 8 (EIGHT) HOURS AS NEEDED FOR PAIN.  30 tablet  1   No current facility-administered medications on file prior to visit.    Allergies  Allergen Reactions  . Cetirizine Hcl     REACTION: not effective  . Erythromycin     REACTION: stomach hurts  . Escitalopram Oxalate     REACTION: no improvement  . Lisinopril     REACTION: cough  . Loratadine     REACTION: not effective  . Nifedipine     REACTION: fatigued and tremor  . Sertraline Hcl     REACTION: no help  . Vyvanse [Lisdexamfetamine Dimesylate]     Headache and excitablility    Past Medical History  Diagnosis Date  . Allergy     allergic rhinitis  . Asthma   . Depression   . GERD (gastroesophageal reflux disease)   . Hypertension   . Fibromyalgia   . Degenerative disc disease   . Sleep apnea   . Fx of fibula  2009  . IBS (irritable bowel syndrome)   . Chronic headaches   . Hemorrhoids   . Iritis, chronic   . Macular hole of right eye     Past Surgical History  Procedure Laterality Date  . Laparoscopy  08/2004    endometriosis - several   . Breast surgery      left nipple removed - benign tumor  . Anterior cruciate ligament repair  1996    small tear of ACL  . Bunionectomy    . Tonsillectomy    . Abdominal hysterectomy      Family History  Problem Relation Age of Onset  . Hypertension Mother   . Thyroid disease Mother   . Fibromyalgia Mother   . Diabetes Father   . Kidney cancer Maternal Grandmother   . Colon cancer Neg Hx   . Colon polyps Mother     History   Social History  . Marital Status: Married    Spouse Name: N/A    Number of Children: 1  . Years of Education: N/A   Occupational History  . Disabled due to fibromyalgia    Social History Main Topics  . Smoking status: Never Smoker   . Smokeless tobacco: Never Used  . Alcohol Use: No  . Drug Use: No   . Sexual Activity: Not on file   Other Topics Concern  . Not on file   Social History Narrative   1 caffeine drink daily    Review of Systems No fever Swallows okay but hurts to swallow    Objective:   Physical Exam  Constitutional: She appears well-developed and well-nourished. No distress.  HENT:  2-8mm ulcer on tip of tongue No other lesions No induration or underlying mass          Assessment & Plan:

## 2013-03-09 NOTE — Patient Instructions (Signed)
Canker Sores  Canker sores are painful, open sores on the inside of the mouth and cheek. They may be white or yellow. The sores usually heal in 1 to 2 weeks. Women are more likely than men to have recurrent canker sores. CAUSES The cause of canker sores is not well understood. More than one cause is likely. Canker sores do not appear to be caused by certain types of germs (viruses or bacteria). Canker sores may be caused by:  An allergic reaction to certain foods.  Digestive problems.  Not having enough vitamin B12, folic acid, and iron.  Female sex hormones. Sores may come only during certain phases of a menstrual cycle. Often, there is improvement during pregnancy.  Genetics. Some people seem to inherit canker sore problems. Emotional stress and injuries to the mouth may trigger outbreaks, but not cause them.  DIAGNOSIS Canker sores are diagnosed by exam.  TREATMENT  Patients who have frequent bouts of canker sores may have cultures taken of the sores, blood tests, or allergy tests. This helps determine if their sores are caused by a poor diet, an allergy, or some other preventable or treatable disease.  Vitamins may prevent recurrences or reduce the severity of canker sores in people with poor nutrition.  Numbing ointments can relieve pain. These are available in drug stores without a prescription.  Anti-inflammatory steroid mouth rinses or gels may be prescribed by your caregiver for severe sores.  Oral steroids may be prescribed if you have severe, recurrent canker sores. These strong medicines can cause many side effects and should be used only under the close direction of a dentist or physician.  Mouth rinses containing the antibiotic medicine may be prescribed. They may lessen symptoms and speed healing. Healing usually happens in about 1 or 2 weeks with or without treatment. Certain antibiotic mouth rinses given to pregnant women and young children can permanently stain teeth.  Talk to your caregiver about your treatment. HOME CARE INSTRUCTIONS   Avoid foods that cause canker sores for you.  Avoid citrus juices, spicy or salty foods, and coffee until the sores are healed.  Use a soft-bristled toothbrush.  Chew your food carefully to avoid biting your cheek.  Apply topical numbing medicine to the sore to help relieve pain.  Apply a thin paste of baking soda and water to the sore to help heal the sore.  Only use mouth rinses or medicines for pain or discomfort as directed by your caregiver. SEEK MEDICAL CARE IF:   Your symptoms are not better in 1 week.  Your sores are still present after 2 weeks.  Your sores are very painful.  You have trouble breathing or swallowing.  Your sores come back frequently. Document Released: 04/18/2010 Document Revised: 04/18/2012 Document Reviewed: 04/18/2010 [employer] Patient Information 2014 [employer].  

## 2013-03-10 ENCOUNTER — Ambulatory Visit: Payer: Medicare HMO | Admitting: Family Medicine

## 2013-03-13 ENCOUNTER — Ambulatory Visit (INDEPENDENT_AMBULATORY_CARE_PROVIDER_SITE_OTHER): Payer: Medicare HMO

## 2013-03-13 DIAGNOSIS — J309 Allergic rhinitis, unspecified: Secondary | ICD-10-CM

## 2013-03-20 ENCOUNTER — Ambulatory Visit (INDEPENDENT_AMBULATORY_CARE_PROVIDER_SITE_OTHER): Payer: Medicare HMO

## 2013-03-20 DIAGNOSIS — J309 Allergic rhinitis, unspecified: Secondary | ICD-10-CM

## 2013-03-27 ENCOUNTER — Ambulatory Visit (INDEPENDENT_AMBULATORY_CARE_PROVIDER_SITE_OTHER): Payer: Medicare HMO

## 2013-03-27 DIAGNOSIS — J309 Allergic rhinitis, unspecified: Secondary | ICD-10-CM

## 2013-04-03 ENCOUNTER — Telehealth: Payer: Self-pay | Admitting: Internal Medicine

## 2013-04-03 ENCOUNTER — Ambulatory Visit (INDEPENDENT_AMBULATORY_CARE_PROVIDER_SITE_OTHER): Payer: Medicare HMO

## 2013-04-03 DIAGNOSIS — J309 Allergic rhinitis, unspecified: Secondary | ICD-10-CM

## 2013-04-03 NOTE — Telephone Encounter (Signed)
[id number] x1 for pt 

## 2013-04-04 NOTE — Telephone Encounter (Signed)
[id number]. Jennifer Castillo, CMA  

## 2013-04-04 NOTE — Telephone Encounter (Signed)
I spoke with the pt and she states that she was told that she would she only be able to get 12 injections for her allergy shots and then she would need another referral through her insurance. I am not sure if she means a prior auth or what is needed. Tammy can you please help with this?!

## 2013-04-04 NOTE — Telephone Encounter (Signed)
[name] is the go to person about referrals.([employer]) I called her and she said the 12 injs. Doesn't start until May. I called [name] and told her,nothing further was needed.

## 2013-04-10 ENCOUNTER — Ambulatory Visit: Payer: Medicare HMO

## 2013-04-17 ENCOUNTER — Ambulatory Visit (INDEPENDENT_AMBULATORY_CARE_PROVIDER_SITE_OTHER): Payer: Commercial Managed Care - HMO

## 2013-04-17 DIAGNOSIS — J309 Allergic rhinitis, unspecified: Secondary | ICD-10-CM

## 2013-04-24 ENCOUNTER — Ambulatory Visit (INDEPENDENT_AMBULATORY_CARE_PROVIDER_SITE_OTHER): Payer: Medicare HMO

## 2013-04-24 DIAGNOSIS — J309 Allergic rhinitis, unspecified: Secondary | ICD-10-CM

## 2013-05-01 ENCOUNTER — Ambulatory Visit (INDEPENDENT_AMBULATORY_CARE_PROVIDER_SITE_OTHER): Payer: Medicare HMO

## 2013-05-01 DIAGNOSIS — J309 Allergic rhinitis, unspecified: Secondary | ICD-10-CM

## 2013-05-08 ENCOUNTER — Ambulatory Visit (INDEPENDENT_AMBULATORY_CARE_PROVIDER_SITE_OTHER): Payer: Commercial Managed Care - HMO

## 2013-05-08 DIAGNOSIS — J309 Allergic rhinitis, unspecified: Secondary | ICD-10-CM

## 2013-05-12 ENCOUNTER — Telehealth: Payer: Self-pay

## 2013-05-12 DIAGNOSIS — IMO0001 Reserved for inherently not codable concepts without codable children: Secondary | ICD-10-CM

## 2013-05-12 NOTE — Telephone Encounter (Signed)
Pt has seen Dr. Garen Grams in 2009 but since it has been over 3 years she would be considered a new pt and pt needs to see a Rheumatologist before her paperwork is due on 06/21/13 so pt needs a referral to a new Rheumatologist and if we can do referral Urgent so she can get scheduled quickly

## 2013-05-12 NOTE — Telephone Encounter (Signed)
I do not see anything in the chart from rheumatology- has she seen one in the past? If so when?  thanks

## 2013-05-12 NOTE — Telephone Encounter (Signed)
Pt left v/m;pt is on disabilty; and disability is requesting rheumatology consult; disability wants information from Rheumatologist prior to 06/21/13. Pt request cb.

## 2013-05-12 NOTE — Telephone Encounter (Signed)
I will do rheumatology referral - unsure if she is going to want to see Dr Garen Grams again or if there will be earlier avail with another  Thanks

## 2013-05-15 ENCOUNTER — Ambulatory Visit (INDEPENDENT_AMBULATORY_CARE_PROVIDER_SITE_OTHER): Payer: Commercial Managed Care - HMO

## 2013-05-15 DIAGNOSIS — J309 Allergic rhinitis, unspecified: Secondary | ICD-10-CM

## 2013-05-22 ENCOUNTER — Ambulatory Visit (INDEPENDENT_AMBULATORY_CARE_PROVIDER_SITE_OTHER): Payer: Commercial Managed Care - HMO

## 2013-05-22 DIAGNOSIS — J309 Allergic rhinitis, unspecified: Secondary | ICD-10-CM

## 2013-05-30 ENCOUNTER — Ambulatory Visit: Payer: Commercial Managed Care - HMO

## 2013-05-31 ENCOUNTER — Ambulatory Visit (INDEPENDENT_AMBULATORY_CARE_PROVIDER_SITE_OTHER): Payer: Commercial Managed Care - HMO

## 2013-05-31 DIAGNOSIS — J309 Allergic rhinitis, unspecified: Secondary | ICD-10-CM

## 2013-06-05 ENCOUNTER — Ambulatory Visit (INDEPENDENT_AMBULATORY_CARE_PROVIDER_SITE_OTHER): Payer: Commercial Managed Care - HMO

## 2013-06-05 DIAGNOSIS — J309 Allergic rhinitis, unspecified: Secondary | ICD-10-CM

## 2013-06-12 ENCOUNTER — Ambulatory Visit (INDEPENDENT_AMBULATORY_CARE_PROVIDER_SITE_OTHER): Payer: Commercial Managed Care - HMO

## 2013-06-12 DIAGNOSIS — J309 Allergic rhinitis, unspecified: Secondary | ICD-10-CM

## 2013-06-19 ENCOUNTER — Ambulatory Visit (INDEPENDENT_AMBULATORY_CARE_PROVIDER_SITE_OTHER): Payer: Commercial Managed Care - HMO

## 2013-06-19 DIAGNOSIS — J309 Allergic rhinitis, unspecified: Secondary | ICD-10-CM

## 2013-06-26 ENCOUNTER — Ambulatory Visit (INDEPENDENT_AMBULATORY_CARE_PROVIDER_SITE_OTHER): Payer: Commercial Managed Care - HMO

## 2013-06-26 DIAGNOSIS — J309 Allergic rhinitis, unspecified: Secondary | ICD-10-CM

## 2013-07-03 ENCOUNTER — Ambulatory Visit (INDEPENDENT_AMBULATORY_CARE_PROVIDER_SITE_OTHER): Payer: Commercial Managed Care - HMO

## 2013-07-03 DIAGNOSIS — J309 Allergic rhinitis, unspecified: Secondary | ICD-10-CM

## 2013-07-10 ENCOUNTER — Ambulatory Visit (INDEPENDENT_AMBULATORY_CARE_PROVIDER_SITE_OTHER): Payer: Commercial Managed Care - HMO

## 2013-07-10 DIAGNOSIS — J309 Allergic rhinitis, unspecified: Secondary | ICD-10-CM

## 2013-07-17 ENCOUNTER — Ambulatory Visit (INDEPENDENT_AMBULATORY_CARE_PROVIDER_SITE_OTHER): Payer: Commercial Managed Care - HMO

## 2013-07-17 DIAGNOSIS — J309 Allergic rhinitis, unspecified: Secondary | ICD-10-CM

## 2013-07-18 ENCOUNTER — Ambulatory Visit (INDEPENDENT_AMBULATORY_CARE_PROVIDER_SITE_OTHER): Payer: Commercial Managed Care - HMO

## 2013-07-18 DIAGNOSIS — J309 Allergic rhinitis, unspecified: Secondary | ICD-10-CM

## 2013-07-24 ENCOUNTER — Ambulatory Visit (INDEPENDENT_AMBULATORY_CARE_PROVIDER_SITE_OTHER): Payer: Commercial Managed Care - HMO

## 2013-07-24 DIAGNOSIS — J309 Allergic rhinitis, unspecified: Secondary | ICD-10-CM

## 2013-07-31 ENCOUNTER — Ambulatory Visit: Payer: Commercial Managed Care - HMO

## 2013-08-04 ENCOUNTER — Encounter: Payer: Self-pay | Admitting: Internal Medicine

## 2013-08-07 ENCOUNTER — Ambulatory Visit (INDEPENDENT_AMBULATORY_CARE_PROVIDER_SITE_OTHER): Payer: Commercial Managed Care - HMO

## 2013-08-07 DIAGNOSIS — J309 Allergic rhinitis, unspecified: Secondary | ICD-10-CM

## 2013-08-14 ENCOUNTER — Ambulatory Visit (INDEPENDENT_AMBULATORY_CARE_PROVIDER_SITE_OTHER): Payer: Commercial Managed Care - HMO

## 2013-08-14 DIAGNOSIS — J309 Allergic rhinitis, unspecified: Secondary | ICD-10-CM

## 2013-08-21 ENCOUNTER — Other Ambulatory Visit: Payer: Medicare HMO

## 2013-08-21 ENCOUNTER — Ambulatory Visit (INDEPENDENT_AMBULATORY_CARE_PROVIDER_SITE_OTHER): Payer: Commercial Managed Care - HMO

## 2013-08-21 DIAGNOSIS — J309 Allergic rhinitis, unspecified: Secondary | ICD-10-CM

## 2013-08-28 ENCOUNTER — Encounter: Payer: Medicare HMO | Admitting: Family Medicine

## 2013-08-28 ENCOUNTER — Ambulatory Visit (INDEPENDENT_AMBULATORY_CARE_PROVIDER_SITE_OTHER): Payer: Commercial Managed Care - HMO

## 2013-08-28 DIAGNOSIS — J309 Allergic rhinitis, unspecified: Secondary | ICD-10-CM

## 2013-09-04 ENCOUNTER — Ambulatory Visit (INDEPENDENT_AMBULATORY_CARE_PROVIDER_SITE_OTHER): Payer: Commercial Managed Care - HMO

## 2013-09-04 DIAGNOSIS — J309 Allergic rhinitis, unspecified: Secondary | ICD-10-CM

## 2013-09-06 ENCOUNTER — Encounter: Payer: Self-pay | Admitting: Internal Medicine

## 2013-09-13 ENCOUNTER — Ambulatory Visit (INDEPENDENT_AMBULATORY_CARE_PROVIDER_SITE_OTHER): Payer: Commercial Managed Care - HMO

## 2013-09-13 DIAGNOSIS — J309 Allergic rhinitis, unspecified: Secondary | ICD-10-CM

## 2013-09-18 ENCOUNTER — Ambulatory Visit (INDEPENDENT_AMBULATORY_CARE_PROVIDER_SITE_OTHER): Payer: Commercial Managed Care - HMO

## 2013-09-18 DIAGNOSIS — J309 Allergic rhinitis, unspecified: Secondary | ICD-10-CM

## 2013-09-25 ENCOUNTER — Ambulatory Visit (INDEPENDENT_AMBULATORY_CARE_PROVIDER_SITE_OTHER): Payer: Commercial Managed Care - HMO

## 2013-09-25 DIAGNOSIS — J309 Allergic rhinitis, unspecified: Secondary | ICD-10-CM

## 2013-09-29 ENCOUNTER — Ambulatory Visit (INDEPENDENT_AMBULATORY_CARE_PROVIDER_SITE_OTHER): Payer: Commercial Managed Care - HMO | Admitting: Family Medicine

## 2013-09-29 ENCOUNTER — Encounter: Payer: Self-pay | Admitting: Family Medicine

## 2013-09-29 ENCOUNTER — Ambulatory Visit (INDEPENDENT_AMBULATORY_CARE_PROVIDER_SITE_OTHER)
Admission: RE | Admit: 2013-09-29 | Discharge: 2013-09-29 | Disposition: A | Discharge status: Home / Self Care | Payer: Commercial Managed Care - HMO | Source: Ambulatory Visit | Attending: Family Medicine | Admitting: Family Medicine

## 2013-09-29 VITALS — BP 120/84 | HR 83 | Temp 99.3°F | Ht 68.0 in | Wt 201.5 lb

## 2013-09-29 DIAGNOSIS — M25569 Pain in unspecified knee: Secondary | ICD-10-CM

## 2013-09-29 DIAGNOSIS — M25561 Pain in right knee: Secondary | ICD-10-CM

## 2013-09-29 MED ORDER — HYDROCODONE-ACETAMINOPHEN 5-325 MG PO TABS: 1.0000 | ORAL_TABLET | Freq: Four times a day (QID) | ORAL | Status: DC | PRN

## 2013-09-29 NOTE — Progress Notes (Signed)
R cataract surgery yesterday.  R eye 20/20 this AM per patient report.    Fell today.  In the garage.  She slipped, wet floor.  R leg pinned back when she did a split, hit medial R knee on the ground.  No LOC.  Walked into clinic today.  More pain as the time from the fall increases. No L leg pain.  R ankle was also sore.  No hip pain.    Meds, vitals, and allergies reviewed.   ROS: See HPI.  Otherwise, noncontributory.  nad Normal ROM R hip.  R knee puffy, ttp anterior/medially. Not bruised.  Pain with ROM but no locking- ROM limited by pain, cannot fully extend by about 15 deg and pain with flexion >90d. No crepitus.  R ankle not ttp anywhere on exam.  No bruising.  Distally NV intact

## 2013-09-29 NOTE — Patient Instructions (Signed)
Ice and pain meds for now, use crutches. Gradually go to toe touches as tolerated.  If not better in a few days then notify us.

## 2013-10-01 DIAGNOSIS — M25561 Pain in right knee: Secondary | ICD-10-CM | POA: Insufficient documentation

## 2013-10-01 NOTE — Assessment & Plan Note (Addendum)
Images reviewed, d/w pt. Moderate joint effusion. Otherwise negative.  D/w pt.  No fx.  Would use crutches for now, hydrocodone with routine cautions.  The physical exam is limited today due to pain but w/o emergent findings. D/w pt.  If not improving, will need f/u.  We didn't brace at this point since she isn't going to be weight bearing for comfort.  She can try some toe touches for pressure tolerance in the next few days.  She'll call back as needed, if not a lot better, early next week. >25 minutes spent in face to face time with patient, >50% spent in counselling or coordination of care.

## 2013-10-09 ENCOUNTER — Ambulatory Visit (INDEPENDENT_AMBULATORY_CARE_PROVIDER_SITE_OTHER): Payer: Commercial Managed Care - HMO

## 2013-10-09 DIAGNOSIS — J309 Allergic rhinitis, unspecified: Secondary | ICD-10-CM

## 2013-10-11 ENCOUNTER — Encounter: Payer: Self-pay | Admitting: Family Medicine

## 2013-10-11 ENCOUNTER — Ambulatory Visit (INDEPENDENT_AMBULATORY_CARE_PROVIDER_SITE_OTHER): Payer: Commercial Managed Care - HMO | Admitting: Family Medicine

## 2013-10-11 VITALS — BP 124/82 | HR 89 | Temp 99.1°F | Ht 67.0 in | Wt 201.0 lb

## 2013-10-11 DIAGNOSIS — M25561 Pain in right knee: Secondary | ICD-10-CM

## 2013-10-11 DIAGNOSIS — Z23 Encounter for immunization: Secondary | ICD-10-CM

## 2013-10-11 NOTE — Progress Notes (Signed)
Pre visit review using our clinic review tool, if applicable. No additional management support is needed unless otherwise documented below in the visit note. 

## 2013-10-11 NOTE — Progress Notes (Signed)
Dr. Frederico Hamman T. Dejanique Ruehl, MD, [facility] [place], [address] Phone: [phone] Fax: [phone]  10/11/2013  Patient: [name], MRN: [id number], DOB: May 04, 1965, 48 y.o.  Primary Physician:  Loura Pardon, MD  Chief Complaint: Knee Pain  Subjective:   [name] is a 48 y.o. very pleasant female patient who presents with the following:  2 weeks ago Friday had cataract surgery. Yesterday it felt, R knee bent up under at time of accident. Has been taking some alleve, propped up and alleve. She describes an incident where her knee  andon that same day, the patient developed  A significant effusion. Since then, she has been using some cru now her knee is starting to feel a  Little bit better. She still has an effusion, but this is decreased in size. No prior trauma or injury.  Past Medical History, Surgical History, Social History, Family History, Problem List, Medications, and Allergies have been reviewed and updated if relevant.  GEN: No fevers, chills. Nontoxic. Primarily MSK c/o today. MSK: Detailed in the HPI GI: tolerating PO intake without difficulty Neuro: No numbness, parasthesias, or tingling associated. Otherwise the pertinent positives of the ROS are noted above.   Objective:   BP 124/82  Pulse 89  Temp(Src) 99.1 F (37.3 C) (Oral)  Ht 5\' 7"  (1.702 m)  Wt 201 lb (91.173 kg)  BMI 31.47 kg/m2  LMP 08/05/2006   GEN: WDWN, NAD, Non-toxic, Alert & Oriented x 3 HEENT: Atraumatic, Normocephalic.  Ears and Nose: No external deformity. EXTR: No clubbing/cyanosis/edema NEURO: antalgic gait.  PSYCH: Normally interactive. Conversant. Not depressed or anxious appearing.  Calm demeanor.   Knee:  R Gait: antalgia ROM: 0-100 Effusion: moderate Echymosis or edema: none Patellar tendon NT Painful PLICA: neg Patellar grind: negative Medial and lateral patellar facet loading: negative medial and lateral  joint lines:NT Mcmurray's neg Flexion-pinch neg Varus and valgus stress: stable Lachman: neg Ant and Post drawer: neg Hip abduction, IR, ER: WNL Hip flexion str: 5/5 Hip abd: 5/5 Quad: 5/5 VMO atrophy:No Hamstring concentric and eccentric: 5/5   Radiology: Dg Knee Complete 4 Views Right  09/29/2013   CLINICAL DATA:  Status post fall.  Right knee pain.  EXAM: RIGHT KNEE - COMPLETE 4+ VIEW  COMPARISON:  MRI right knee 07/07/2007.  FINDINGS: Moderate joint effusion is identified. No fracture is seen. Joint spaces are preserved. No focal bony lesion is identified.  IMPRESSION: Moderate joint effusion.  Otherwise negative.   Electronically Signed   By: Inge Rise M.D.   On: 09/29/2013 15:53    Assessment and Plan:   Knee pain, acute, right  Need for prophylactic vaccination and inoculation against influenza - Plan: Flu Vaccine QUAD 36+ mos IM  No significant derangement seen on films with neglible OA.  Likely all from acute trauma. Ligaments appear intact. For now, continue conservative management, rest, NSAIDS, stop using crutches and reassess in 1 month. May all be from direct trauma. Cannot exclude meniscal injury.  Follow-up: Return in about 1 month (around 11/11/2013).  New Prescriptions   No medications on file   Orders Placed This Encounter  Procedures  . Flu Vaccine QUAD 36+ mos IM    Signed,  Ziah Turvey T. Jovontae Banko, MD   Patient's Medications  New Prescriptions   No medications on file  Previous Medications   ACETAMINOPHEN (TYLENOL) 500 MG TABLET    OTC as directed.    BUPROPION (WELLBUTRIN XL) 300  MG 24 HR TABLET    Take 1 tablet (300 mg total) by mouth daily.   CARVEDILOL (COREG) 6.25 MG TABLET    Take 1 tablet (6.25 mg total) by mouth 2 (two) times daily with a meal.   DIPHENHYDRAMINE (BENADRYL) 25 MG CAPSULE    OTC as directed.    FLUTICASONE-SALMETEROL (ADVAIR) 250-50 MCG/DOSE AEPB    Inhale 1 puff into the lungs as needed (daily as needed). Rinse mouth.  Use 1 puff, twice daily   FOLIC ACID (FOLVITE) 1 MG TABLET    Take 2 mg by mouth daily.    HYDROCODONE-ACETAMINOPHEN (NORCO/VICODIN) 5-325 MG PER TABLET    Take 1-2 tablets by mouth every 6 (six) hours as needed for severe pain (sedation caution).   METHOCARBAMOL (ROBAXIN) 500 MG TABLET    TAKE 1 TABLET (500 MG TOTAL) BY MOUTH AT BEDTIME AS NEEDED.   METHOTREXATE (RHEUMATREX) 2.5 MG TABLET    Take 25 mg by mouth once a week. Caution:Chemotherapy. Protect from light.   MONTELUKAST (SINGULAIR) 10 MG TABLET    Take 1 tablet (10 mg total) by mouth at bedtime. 1 by mouth once daily as needed for asthma and allergy symptoms   MUPIROCIN OINTMENT (BACTROBAN) 2 %    Apply 1 application topically 3 (three) times daily. To affected area   NEPAFENAC (NEVANAC) 0.1 % OPHTHALMIC SUSPENSION    Place 1 drop into the right eye 3 (three) times daily.   OLMESARTAN (BENICAR) 40 MG TABLET    Take 1 tablet (40 mg total) by mouth daily.   OMEGA-3 FATTY ACIDS (FISH OIL PO)    Take 1 capsule by mouth 2 (two) times daily.   PREDNISOLONE ACETATE (PRED FORTE) 1 % OPHTHALMIC SUSPENSION    Place 1 drop into both eyes daily.    PROAIR HFA 108 (90 BASE) MCG/ACT INHALER    INHALE 2 PUFFS EVERY 4 HOURS AS NEEDED FOR WHEEZING   SCOPOLAMINE (TRANSDERM-SCOP) 1.5 MG    Place 1 patch (1.5 mg total) onto the skin every 3 (three) days.   TRAMADOL (ULTRAM) 50 MG TABLET    TAKE 1 TABLET (50 MG TOTAL) BY MOUTH EVERY 8 (EIGHT) HOURS AS NEEDED FOR PAIN.  Modified Medications   No medications on file  Discontinued Medications   MOXIFLOXACIN (VIGAMOX) 0.5 % OPHTHALMIC SOLUTION    Place 1 drop into the right eye 3 (three) times daily.

## 2013-10-16 ENCOUNTER — Ambulatory Visit (INDEPENDENT_AMBULATORY_CARE_PROVIDER_SITE_OTHER): Payer: Commercial Managed Care - HMO

## 2013-10-16 DIAGNOSIS — J309 Allergic rhinitis, unspecified: Secondary | ICD-10-CM

## 2013-10-23 ENCOUNTER — Ambulatory Visit (INDEPENDENT_AMBULATORY_CARE_PROVIDER_SITE_OTHER): Payer: Commercial Managed Care - HMO

## 2013-10-23 DIAGNOSIS — J309 Allergic rhinitis, unspecified: Secondary | ICD-10-CM

## 2013-10-30 ENCOUNTER — Ambulatory Visit (INDEPENDENT_AMBULATORY_CARE_PROVIDER_SITE_OTHER): Payer: Commercial Managed Care - HMO

## 2013-10-30 DIAGNOSIS — J309 Allergic rhinitis, unspecified: Secondary | ICD-10-CM

## 2013-11-06 ENCOUNTER — Ambulatory Visit: Payer: Commercial Managed Care - HMO

## 2013-11-07 ENCOUNTER — Ambulatory Visit (INDEPENDENT_AMBULATORY_CARE_PROVIDER_SITE_OTHER): Payer: Commercial Managed Care - HMO

## 2013-11-07 DIAGNOSIS — J309 Allergic rhinitis, unspecified: Secondary | ICD-10-CM

## 2013-11-08 ENCOUNTER — Ambulatory Visit: Payer: Commercial Managed Care - HMO

## 2013-11-13 ENCOUNTER — Ambulatory Visit (INDEPENDENT_AMBULATORY_CARE_PROVIDER_SITE_OTHER): Payer: Commercial Managed Care - HMO

## 2013-11-13 ENCOUNTER — Ambulatory Visit: Payer: Commercial Managed Care - HMO | Admitting: Family Medicine

## 2013-11-13 ENCOUNTER — Encounter: Payer: Self-pay | Admitting: Internal Medicine

## 2013-11-13 DIAGNOSIS — J309 Allergic rhinitis, unspecified: Secondary | ICD-10-CM

## 2013-11-20 ENCOUNTER — Ambulatory Visit (INDEPENDENT_AMBULATORY_CARE_PROVIDER_SITE_OTHER): Payer: Commercial Managed Care - HMO

## 2013-11-20 DIAGNOSIS — J309 Allergic rhinitis, unspecified: Secondary | ICD-10-CM

## 2013-11-27 ENCOUNTER — Ambulatory Visit (INDEPENDENT_AMBULATORY_CARE_PROVIDER_SITE_OTHER): Payer: Commercial Managed Care - HMO

## 2013-11-27 ENCOUNTER — Encounter: Payer: Self-pay | Admitting: Family Medicine

## 2013-11-27 DIAGNOSIS — J309 Allergic rhinitis, unspecified: Secondary | ICD-10-CM

## 2013-12-04 ENCOUNTER — Ambulatory Visit: Payer: Commercial Managed Care - HMO

## 2013-12-05 ENCOUNTER — Telehealth: Payer: Self-pay | Admitting: Family Medicine

## 2013-12-05 ENCOUNTER — Ambulatory Visit (INDEPENDENT_AMBULATORY_CARE_PROVIDER_SITE_OTHER): Payer: Commercial Managed Care - HMO

## 2013-12-05 DIAGNOSIS — J309 Allergic rhinitis, unspecified: Secondary | ICD-10-CM

## 2013-12-05 MED ORDER — LOSARTAN POTASSIUM 50 MG PO TABS: 50.0000 mg | ORAL_TABLET | Freq: Every day | ORAL | Status: DC

## 2013-12-05 NOTE — Telephone Encounter (Signed)
Changing benicar to losartan for $ reasons

## 2013-12-12 ENCOUNTER — Ambulatory Visit (INDEPENDENT_AMBULATORY_CARE_PROVIDER_SITE_OTHER): Payer: Commercial Managed Care - HMO | Admitting: Internal Medicine

## 2013-12-12 ENCOUNTER — Ambulatory Visit (INDEPENDENT_AMBULATORY_CARE_PROVIDER_SITE_OTHER): Payer: Commercial Managed Care - HMO

## 2013-12-12 ENCOUNTER — Encounter: Payer: Self-pay | Admitting: Internal Medicine

## 2013-12-12 VITALS — BP 132/70 | HR 80 | Ht 67.0 in | Wt 206.0 lb

## 2013-12-12 DIAGNOSIS — J309 Allergic rhinitis, unspecified: Secondary | ICD-10-CM

## 2013-12-12 DIAGNOSIS — G4733 Obstructive sleep apnea (adult) (pediatric): Secondary | ICD-10-CM

## 2013-12-12 NOTE — Patient Instructions (Signed)
We can continue CPAP 11/ [facility]- Let us know if your insurance changes  We can continue allergy vaccine 1:50 [place]  Ok to stop Advair. We can always replace it in the future if needed.  Please call if you need refill or any help we can give.

## 2013-12-12 NOTE — Progress Notes (Signed)
Patient ID: [name], female    DOB: 02-24-1965, 48 y.o.   MRN: [id number]  HPI 27 yoF followed for obstructive sleep apnea and allergic rhinitis, complicated by insomnia, GERD, depression. Last here March 9 when we gave depo shot which made her much better. She was able to go back to full time use of her CPAP machine - 11 cwp Advanced. We discussed her meds. She feels well today.   11/06/10- 59 yoF followed for obstructive sleep apnea and allergic rhinitis, complicated by insomnia, GERD, depression. Has had flu vaccine. Had sustained head congestion and productive cough one month ago. Her primary physician treated Augmentin and cough syrup. She was clear after that until 4 days ago. One day after her flu shot, she woke with scratchy throat, no fever, increased head congestion.Unable to use CPAP while her head is congested.her ophthalmologist has asked her to get allergy testing done.  Years ago, allergy testing was positive and she was on allergy vaccine for a while. She does not remember details or response.  01/07/11-  33 yoF followed for obstructive sleep apnea and allergic rhinitis, complicated by insomnia, GERD, depression. Comes today for allergy skin testing off antihistamines. No recent respiratory infections. Wheeze very occasionally with exertion.  Anticipates seasonal flare as February pollens start. Had flu vaccine. Uses CPAP regularly "as long as I can breathe". Allergy Skin tests- significant positive reactions for numerous, and inhalant allergens, particularly grass weed and tree pollens, dust mite, cat and some molds. She wishes to start allergy vaccine.  03/12/11-  46 yoF followed for obstructive sleep apnea and allergic rhinitis, complicated by insomnia, GERD, depression. Wearing CPAP less consistently in the last 2 weeks because of nasal congestion. Building allergy vaccine without problems. Taking Allegra-D plus Sudafed. We discussed this. Using saline nasal rinse. Continues  Advair 250. Using rescue inhaler about twice a week.  07/13/11- 46 yoF followed for obstructive sleep apnea and allergic rhinitis, complicated by insomnia, GERD, depression. Having reactions in left arm only. We are holding her allergy vaccine at 1:50/0.4 mL. CPAP 11/[facility]-working very well for her. She describes good compliance and control with no problems.  10/13/11- 73 yoF followed for obstructive sleep apnea and allergic rhinitis, complicated by insomnia, GERD, depression. On vaccine(no reaction for about 3 wks and then started today)itchy red area.  Has had flu vaccine. Holding at 1:50, 0.3 mL's. She does say she considers herself better off on allergy vaccine. Her new eye doctor asked her to see me with question of whether allergic sinus problems could be related to 3 episodes of iritis. She is now on steroid eyedrops. She continues daily Zyrtec and Singulair with Benadryl at night.  02/15/12-46 yoF followed for obstructive sleep apnea and allergic rhinitis, complicated by insomnia, GERD, depression. FOLLOWS FOR: on allergy vaccine 1:50 0.3 mL GH and still having slight reactions; got shot today and slight redness around injection site. Minor local reaction occasionally to her allergy shots. She mentions these when asked but says they don't bother her at all. Still occasional episode of iritis, one eye or the other, with no pattern. Continues doing well with CPAP 11/Advanced used all night every night. CXR 11/02/11 IMPRESSION:  Negative  Original Report Authenticated By: Truett Perna, M.D.  06/13/12- 46 yoF followed for obstructive sleep apnea and allergic rhinitis, complicated by insomnia, GERD, depression. On allergy on vaccine.  No problems at this time.   Wearing cpap11/Advanced everynight for approx 6-8 hours.  No problems with mask  or pressure. Had local reaction after reaching vaccine concentration 1:10 GH, so back down to 1:50 for now. We discussed risk benefit and management of  reactions. Check replacement for CPAP machine which has developed an odor. She also needs a chin strap because her mouth dries out when wearing nasal CPAP.  12/12/12- 80 yoF followed for obstructive sleep apnea and allergic rhinitis, complicated by insomnia, GERD, depression. FOLLOWS FOR:  Wearing CPAP 11/ Advanced 6-8 hours per night.   Still doing well on Allergy vaccine 1:50 [place] She likes her new CPAP machine with nasal pillows mask  12/12/13- 72 yoF followed for obstructive sleep apnea and allergic rhinitis, complicated by insomnia, GERD, depression. FOLLOWS FOR: wears cpap 11/ [employer] 6-8 hours nightly, no complaints with pressure/supplies.  Tolerating allergy vaccines 1:50 GH well.    Review of Systems- see HPI Constitutional:   No-   weight loss, night sweats, fevers, chills, fatigue, lassitude. HEENT:   No-  headaches, difficulty swallowing, tooth/dental problems, sore throat,       No- sneezing, itching, ear ache, +nasal congestion, little post nasal drip,  CV:  No-   chest pain, orthopnea, PND, swelling in lower extremities, anasarca, dizziness, palpitations Resp: No-   shortness of breath with exertion or at rest.              No-   productive cough, occasional non-productive cough,  No- coughing up of blood.              No-   change in color of mucus.  No- wheezing.   Skin: No-   rash or lesions. GI:  No-   heartburn, indigestion, abdominal pain, nausea, vomiting,  GU: MS:   Neuro-     nothing unusual Psych:  No- change in mood or affect. No depression or anxiety.  No memory loss.  Objective:   Physical Exam General- Alert, Oriented, Affect-appropriate, Distress- none acute Skin- +keratosis pilaris Lymphadenopathy- none Head- atraumatic            Eyes- Gross vision intact, PERRLA, conjunctivae clear secretions            Ears- Hearing, canals-normal            Nose- no-mucus bridging, turinate edema, +Septal dev,no- polyps, erosion, perforation             Throat-  Mallampati II-III , mucosa clear , drainage- none, tonsils- atrophic Neck- flexible , trachea midline, no stridor , thyroid nl, carotid no bruit Chest - symmetrical excursion , unlabored           Heart/CV- RRR , no murmur , no gallop  , no rub, nl s1 s2                           - JVD- none , edema- none, stasis changes- none, varices- none           Lung- clear to P&A, wheeze- none, cough- none  , dullness-none, rub- none           Chest wall-  Abd-  Br/ Gen/ Rectal- Not done, not indicated Extrem- cyanosis- none, clubbing, none, atrophy- none, strength- nl Neuro- grossly intact to observation

## 2013-12-13 ENCOUNTER — Other Ambulatory Visit: Payer: Self-pay

## 2013-12-13 MED ORDER — MONTELUKAST SODIUM 10 MG PO TABS: 10.0000 mg | ORAL_TABLET | Freq: Every day | ORAL | Status: DC

## 2013-12-14 ENCOUNTER — Other Ambulatory Visit: Payer: Self-pay | Admitting: *Deleted

## 2013-12-14 MED ORDER — CARVEDILOL 6.25 MG PO TABS: 6.2500 mg | ORAL_TABLET | Freq: Two times a day (BID) | ORAL | Status: DC

## 2013-12-15 ENCOUNTER — Other Ambulatory Visit: Payer: Self-pay | Admitting: *Deleted

## 2013-12-15 DIAGNOSIS — Z961 Presence of intraocular lens: Secondary | ICD-10-CM | POA: Insufficient documentation

## 2013-12-19 ENCOUNTER — Ambulatory Visit (INDEPENDENT_AMBULATORY_CARE_PROVIDER_SITE_OTHER): Payer: Commercial Managed Care - HMO

## 2013-12-19 DIAGNOSIS — J309 Allergic rhinitis, unspecified: Secondary | ICD-10-CM

## 2013-12-20 ENCOUNTER — Ambulatory Visit (INDEPENDENT_AMBULATORY_CARE_PROVIDER_SITE_OTHER): Payer: Commercial Managed Care - HMO

## 2013-12-20 DIAGNOSIS — J309 Allergic rhinitis, unspecified: Secondary | ICD-10-CM

## 2013-12-26 ENCOUNTER — Ambulatory Visit (INDEPENDENT_AMBULATORY_CARE_PROVIDER_SITE_OTHER): Payer: Commercial Managed Care - HMO

## 2013-12-26 DIAGNOSIS — J309 Allergic rhinitis, unspecified: Secondary | ICD-10-CM

## 2014-01-02 ENCOUNTER — Ambulatory Visit (INDEPENDENT_AMBULATORY_CARE_PROVIDER_SITE_OTHER): Payer: Commercial Managed Care - HMO

## 2014-01-02 DIAGNOSIS — J309 Allergic rhinitis, unspecified: Secondary | ICD-10-CM

## 2014-01-09 ENCOUNTER — Ambulatory Visit (INDEPENDENT_AMBULATORY_CARE_PROVIDER_SITE_OTHER): Payer: Medicare HMO

## 2014-01-09 DIAGNOSIS — J309 Allergic rhinitis, unspecified: Secondary | ICD-10-CM

## 2014-01-10 ENCOUNTER — Encounter: Payer: Self-pay | Admitting: Internal Medicine

## 2014-01-10 ENCOUNTER — Other Ambulatory Visit: Payer: Self-pay | Admitting: *Deleted

## 2014-01-10 MED ORDER — BUPROPION HCL ER (XL) 300 MG PO TB24: 300.0000 mg | ORAL_TABLET | Freq: Every day | ORAL | Status: DC

## 2014-01-10 NOTE — Telephone Encounter (Signed)
Refill for a mo please

## 2014-01-10 NOTE — Telephone Encounter (Signed)
done

## 2014-01-10 NOTE — Telephone Encounter (Signed)
Fax refill request, pt has CPE on 01/24/14, please advise

## 2014-01-15 ENCOUNTER — Telehealth: Payer: Self-pay | Admitting: Family Medicine

## 2014-01-15 DIAGNOSIS — Z0000 Encounter for general adult medical examination without abnormal findings: Secondary | ICD-10-CM

## 2014-01-15 DIAGNOSIS — R739 Hyperglycemia, unspecified: Secondary | ICD-10-CM

## 2014-01-15 NOTE — Telephone Encounter (Signed)
-----   Message from Ellamae Sia sent at 01/10/2014  3:39 PM EST ----- Regarding: Lab orders for Tuesday, 1.12.16 Patient is scheduled for CPX labs, please order future labs, Thanks , Karna Christmas

## 2014-01-16 ENCOUNTER — Ambulatory Visit (INDEPENDENT_AMBULATORY_CARE_PROVIDER_SITE_OTHER): Payer: Medicare HMO

## 2014-01-16 DIAGNOSIS — J309 Allergic rhinitis, unspecified: Secondary | ICD-10-CM

## 2014-01-17 ENCOUNTER — Other Ambulatory Visit (INDEPENDENT_AMBULATORY_CARE_PROVIDER_SITE_OTHER): Payer: Medicare HMO

## 2014-01-17 DIAGNOSIS — R739 Hyperglycemia, unspecified: Secondary | ICD-10-CM

## 2014-01-17 DIAGNOSIS — Z0000 Encounter for general adult medical examination without abnormal findings: Secondary | ICD-10-CM

## 2014-01-17 DIAGNOSIS — I10 Essential (primary) hypertension: Secondary | ICD-10-CM

## 2014-01-17 LAB — COMPREHENSIVE METABOLIC PANEL
ALT: 13 U/L (ref 0–35)
AST: 28 U/L (ref 0–37)
Albumin: 4.1 g/dL (ref 3.5–5.2)
Alkaline Phosphatase: 94 U/L (ref 39–117)
BILIRUBIN TOTAL: 0.5 mg/dL (ref 0.2–1.2)
BUN: 12 mg/dL (ref 6–23)
CO2: 25 meq/L (ref 19–32)
CREATININE: 0.89 mg/dL (ref 0.40–1.20)
Calcium: 9.6 mg/dL (ref 8.4–10.5)
Chloride: 105 mEq/L (ref 96–112)
GFR: 72.39 mL/min (ref >60.00)
GLUCOSE: 100 mg/dL — AB (ref 70–99)
Potassium: 4.3 mEq/L (ref 3.5–5.1)
SODIUM: 139 meq/L (ref 135–145)
TOTAL PROTEIN: 7.5 g/dL (ref 6.0–8.3)

## 2014-01-17 LAB — HEMOGLOBIN A1C: HEMOGLOBIN A1C: 6.3 % (ref 4.6–6.5)

## 2014-01-17 LAB — LIPID PANEL
Cholesterol: 175 mg/dL (ref 0–200)
HDL: 54.2 mg/dL (ref >39.00)
LDL CALC: 94 mg/dL (ref 0–99)
NonHDL: 120.8
TRIGLYCERIDES: 135 mg/dL (ref 0.0–149.0)
Total CHOL/HDL Ratio: 3
VLDL: 27 mg/dL (ref 0.0–40.0)

## 2014-01-17 LAB — CBC WITH DIFFERENTIAL/PLATELET
BASOS ABS: 0.1 10*3/uL (ref 0.0–0.1)
Basophils Relative: 0.8 % (ref 0.0–3.0)
EOS PCT: 4.4 % (ref 0.0–5.0)
Eosinophils Absolute: 0.3 10*3/uL (ref 0.0–0.7)
HCT: 43.3 % (ref 36.0–46.0)
Hemoglobin: 14.3 g/dL (ref 12.0–15.0)
LYMPHS ABS: 1.8 10*3/uL (ref 0.7–4.0)
Lymphocytes Relative: 29.4 % (ref 12.0–46.0)
MCHC: 32.9 g/dL (ref 30.0–36.0)
MCV: 90.3 fl (ref 78.0–100.0)
MONO ABS: 0.5 10*3/uL (ref 0.1–1.0)
Monocytes Relative: 7.4 % (ref 3.0–12.0)
Neutro Abs: 3.6 10*3/uL (ref 1.4–7.7)
Neutrophils Relative %: 58 % (ref 43.0–77.0)
PLATELETS: 272 10*3/uL (ref 150.0–400.0)
RBC: 4.8 Mil/uL (ref 3.87–5.11)
RDW: 15.8 % — AB (ref 11.5–15.5)
WBC: 6.3 10*3/uL (ref 4.0–10.5)

## 2014-01-17 LAB — TSH: TSH: 4.78 u[IU]/mL — ABNORMAL HIGH (ref 0.35–4.50)

## 2014-01-24 ENCOUNTER — Encounter: Payer: Self-pay | Admitting: Family Medicine

## 2014-01-24 ENCOUNTER — Ambulatory Visit (INDEPENDENT_AMBULATORY_CARE_PROVIDER_SITE_OTHER): Payer: Medicare HMO | Admitting: Family Medicine

## 2014-01-24 VITALS — BP 128/85 | HR 88 | Temp 99.4°F | Ht 66.5 in | Wt 201.8 lb

## 2014-01-24 DIAGNOSIS — R946 Abnormal results of thyroid function studies: Secondary | ICD-10-CM

## 2014-01-24 DIAGNOSIS — R739 Hyperglycemia, unspecified: Secondary | ICD-10-CM

## 2014-01-24 DIAGNOSIS — B9789 Other viral agents as the cause of diseases classified elsewhere: Secondary | ICD-10-CM

## 2014-01-24 DIAGNOSIS — Z0000 Encounter for general adult medical examination without abnormal findings: Secondary | ICD-10-CM

## 2014-01-24 DIAGNOSIS — R7989 Other specified abnormal findings of blood chemistry: Secondary | ICD-10-CM

## 2014-01-24 DIAGNOSIS — E039 Hypothyroidism, unspecified: Secondary | ICD-10-CM | POA: Insufficient documentation

## 2014-01-24 DIAGNOSIS — J069 Acute upper respiratory infection, unspecified: Secondary | ICD-10-CM | POA: Insufficient documentation

## 2014-01-24 DIAGNOSIS — I10 Essential (primary) hypertension: Secondary | ICD-10-CM

## 2014-01-24 MED ORDER — BENZONATATE 200 MG PO CAPS: 200.0000 mg | ORAL_CAPSULE | Freq: Three times a day (TID) | ORAL | Status: DC | PRN

## 2014-01-24 NOTE — Progress Notes (Signed)
Pre visit review using our clinic review tool, if applicable. No additional management support is needed unless otherwise documented below in the visit note. 

## 2014-01-24 NOTE — Patient Instructions (Addendum)
For cold symptoms - update [place] if you get facial pain with thick drainage that gets worse after the 10 day mark  Or if you get better and then get worse  Don't forget to schedule your mammogram  Work on low sugar diet/exercise/ weight loss  Follow up in 6 months with labs prior (not fasting)- to check A1C , and also some thyroid tests  Try tessalon for cough  Drink fluids and rest

## 2014-01-24 NOTE — Progress Notes (Signed)
Subjective:    Patient ID: [name], female    DOB: 09/27/1965, 49 y.o.   MRN: [id number]  HPI Here for annual medicare wellness exam and acute /chronic medical problems  I have personally reviewed the Medicare Annual Wellness questionnaire and have noted 1. The patient's medical and social history 2. Their use of alcohol, tobacco or illicit drugs 3. Their current medications and supplements 4. The patient's functional ability including ADL's, fall risks, home safety risks and hearing or visual             impairment. 5. Diet and physical activities 6. Evidence for depression or mood disorders  The patients weight, height, BMI have been recorded in the chart and visual acuity is per eye clinic.  I have made referrals, counseling and provided education to the patient based review of the above and I have provided the pt with a written personalized care plan for preventive services.  Has a bad cold  Low grade temp  Cough with chest congestion  Runny and stuffy nose Takes mucinex  Started Friday  No facial pain  No purulent drainage   Otherwise -doing ok / same as usual   See scanned forms.  Routine anticipatory guidance given to patient.  See health maintenance. Colon cancer screening- will need to start back with colonosc at 89 Last one was normal Breast cancer screening -was 2013 - [facility] - she will make her own appt  Self breast exam no lumps  Flu vaccine 10/15  Tetanus vaccine 7/13   Advance directive she has a living will / her ex husband has it - she may work on another one  Cognitive function addressed- see scanned forms- and if abnormal then additional documentation follows.  No new memory issues   PMH and SH reviewed  Meds, vitals, and allergies reviewed.   ROS: See HPI.  Otherwise negative.    bp is stable today  No cp or palpitations or headaches or edema  No side effects to medicines  BP Readings from Last 3 Encounters:  01/24/14 120/90   12/12/13 132/70  10/11/13 124/82    She has had otc cold medicines -? If affected bp   Wt is down 5 lb with bmi of 32  Hyperglycemia Lab Results  Component Value Date   HGBA1C 6.3 01/17/2014   Last time 6.2  Holiday eating was not the best -plans to do better  Not a lot of exercise - limited   tsh was slt up  Lab Results  Component Value Date   TSH 4.78* 01/17/2014    Has thyroid issues in the family  Will watch this   Cholesterol Lab Results  Component Value Date   CHOL 175 01/17/2014   CHOL 178 10/11/2012   CHOL 197 10/02/2011   Lab Results  Component Value Date   HDL 54.20 01/17/2014   HDL 49.00 10/11/2012   HDL 56 10/02/2011   Lab Results  Component Value Date   LDLCALC 94 01/17/2014   LDLCALC 108* 10/11/2012   LDLCALC 113* 10/02/2011   Lab Results  Component Value Date   TRIG 135.0 01/17/2014   TRIG 106.0 10/11/2012   TRIG 139 10/02/2011   Lab Results  Component Value Date   CHOLHDL 3 01/17/2014   CHOLHDL 4 10/11/2012   CHOLHDL 3.5 10/02/2011   No results found for: LDLDIRECT  Cholesterol is improved  Trying to watch fats -does not like red meat greasy foods   Patient Active  Problem List   Diagnosis Date Noted  . Knee pain, right 10/01/2013  . Aphthous ulcer of tongue 03/09/2013  . Papule 03/02/2013  . Paronychia 02/10/2013  . Nasal septal deviation 01/03/2013  . Encounter for Medicare annual wellness exam 11/02/2012  . Hydradenitis 11/02/2012  . Hyperglycemia 03/23/2012  . Macular hole 03/23/2012  . Screening-pulmonary TB 03/23/2012  . Routine general medical examination at a health care facility 10/02/2011  . Iritis, recurrent 10/02/2011  . Tachycardia 10/02/2011  . Other screening mammogram 10/02/2011  . ADD (attention deficit disorder) 08/20/2010  . DYSHIDROTIC ECZEMA, HANDS 11/12/2009  . OBSTRUCTIVE SLEEP APNEA 11/19/2006  . DEPRESSION 09/29/2006  . Essential hypertension 09/29/2006  . Seasonal and perennial allergic rhinitis  09/29/2006  . ASTHMA 09/29/2006  . GERD 09/29/2006  . IBS 09/29/2006  . ENDOMETRIOSIS 09/29/2006  . FEMALE INFERTILITY 09/29/2006  . FIBROMYALGIA 09/29/2006  . INSOMNIA 09/29/2006   Past Medical History  Diagnosis Date  . Allergy     allergic rhinitis  . Asthma   . Depression   . GERD (gastroesophageal reflux disease)   . Hypertension   . Fibromyalgia   . Degenerative disc disease   . Sleep apnea   . Fx of fibula 2009  . IBS (irritable bowel syndrome)   . Chronic headaches   . Hemorrhoids   . Iritis, chronic   . Macular hole of right eye    Past Surgical History  Procedure Laterality Date  . Laparoscopy  08/2004    endometriosis - several   . Breast surgery      left nipple removed - benign tumor  . Anterior cruciate ligament repair  1996    small tear of ACL  . Bunionectomy    . Tonsillectomy    . Abdominal hysterectomy     History  Substance Use Topics  . Smoking status: Never Smoker   . Smokeless tobacco: Never Used  . Alcohol Use: No   Family History  Problem Relation Age of Onset  . Hypertension Mother   . Thyroid disease Mother   . Fibromyalgia Mother   . Diabetes Father   . Kidney cancer Maternal Grandmother   . Colon cancer Neg Hx   . Colon polyps Mother    Allergies  Allergen Reactions  . Cetirizine Hcl     REACTION: not effective  . Erythromycin     REACTION: stomach hurts  . Escitalopram Oxalate     REACTION: no improvement  . Lisinopril     REACTION: cough  . Loratadine     REACTION: not effective  . Nifedipine     REACTION: fatigued and tremor  . Sertraline Hcl     REACTION: no help  . Vyvanse [Lisdexamfetamine Dimesylate]     Headache and excitablility   Current Outpatient Prescriptions on File Prior to Visit  Medication Sig Dispense Refill  . acetaminophen (TYLENOL) 500 MG tablet OTC as directed.     Marland Kitchen buPROPion (WELLBUTRIN XL) 300 MG 24 hr tablet Take 1 tablet (300 mg total) by mouth daily. 30 tablet 0  . carvedilol (COREG)  6.25 MG tablet Take 1 tablet (6.25 mg total) by mouth 2 (two) times daily with a meal. 180 tablet 0  . diphenhydrAMINE (BENADRYL) 25 mg capsule OTC as directed.     . folic acid (FOLVITE) 1 MG tablet Take 2 mg by mouth daily.     Marland Kitchen losartan (COZAAR) 50 MG tablet Take 1 tablet (50 mg total) by mouth daily. 30 tablet 11  .  methocarbamol (ROBAXIN) 500 MG tablet TAKE 1 TABLET (500 MG TOTAL) BY MOUTH AT BEDTIME AS NEEDED. 30 tablet 1  . methotrexate (RHEUMATREX) 2.5 MG tablet Take 25 mg by mouth once a week. Caution:Chemotherapy. Protect from light.    . montelukast (SINGULAIR) 10 MG tablet Take 1 tablet (10 mg total) by mouth at bedtime. 90 tablet 3  . prednisoLONE acetate (PRED FORTE) 1 % ophthalmic suspension Place 1 drop into both eyes daily.     Marland Kitchen PROAIR HFA 108 (90 BASE) MCG/ACT inhaler INHALE 2 PUFFS EVERY 4 HOURS AS NEEDED FOR WHEEZING 8.5 each 3  . traMADol (ULTRAM) 50 MG tablet TAKE 1 TABLET (50 MG TOTAL) BY MOUTH EVERY 8 (EIGHT) HOURS AS NEEDED FOR PAIN. 30 tablet 1   No current facility-administered medications on file prior to visit.     Review of Systems Review of Systems  Constitutional: Negative for fever, appetite change,  and unexpected weight change.  ENT pos for cong and rhinorrhea and sinus pressure  Eyes: Negative for pain and visual disturbance.  Respiratory: Negative for wheeze  and shortness of breath.   Cardiovascular: Negative for cp or palpitations    Gastrointestinal: Negative for nausea, diarrhea and constipation.  Genitourinary: Negative for urgency and frequency.  Skin: Negative for pallor or rash   Neurological: Negative for weakness, light-headedness, numbness and headaches.  Hematological: Negative for adenopathy. Does not bruise/bleed easily.  Psychiatric/Behavioral: Negative for dysphoric mood. The patient is not nervous/anxious.         Objective:   Physical Exam  Constitutional: She appears well-developed and well-nourished. No distress.  obese and  well appearing   HENT:  Head: Atraumatic. Macrocephalic.  Right Ear: External ear normal.  Left Ear: External ear normal.  Mouth/Throat: Oropharynx is clear and moist.  Nares are injected and congested  No sinus tenderness Clear rhinorrhea   Eyes: Conjunctivae and EOM are normal. Pupils are equal, round, and reactive to light. Right eye exhibits no discharge. Left eye exhibits no discharge. No scleral icterus.  Neck: Normal range of motion. Neck supple. No JVD present. No thyromegaly present.  Cardiovascular: Normal rate, regular rhythm, normal heart sounds and intact distal pulses.  Exam reveals no gallop.   Pulmonary/Chest: Effort normal and breath sounds normal. No respiratory distress. She has no wheezes. She has no rales.  Abdominal: Soft. Bowel sounds are normal. She exhibits no distension and no mass. There is no tenderness.  Genitourinary:  Breast exam: No mass, nodules, thickening, tenderness, bulging, retraction, inflamation, nipple discharge or skin changes noted.  No axillary or clavicular LA.      Musculoskeletal: She exhibits no edema or tenderness.  Lymphadenopathy:    She has no cervical adenopathy.  Neurological: She is alert. She has normal reflexes. No cranial nerve deficit. She exhibits normal muscle tone. Coordination normal.  Skin: Skin is warm and dry. No rash noted. No erythema. No pallor.  Psychiatric: She has a normal mood and affect.          Assessment & Plan:   Problem List Items Addressed This Visit      Cardiovascular and Mediastinum   Essential hypertension    bp in fair control at this time  BP Readings from Last 1 Encounters:  01/24/14 128/85   No changes needed Disc lifstyle change with low sodium diet and exercise   Labs reviewed         Respiratory   Viral URI with cough    Disc symptomatic care - see  instructions on AVS  Tessalon for cough Re assuring exam  Disc s/s of sinusitis to watch for  Update if not starting to improve in  a week or if worsening          Other   Elevated TSH    Mild No  Clinical changes  Will re check at next visit with free T4 Disc what symptoms to watch for for hypothyroidism      Encounter for Medicare annual wellness exam - Primary    Reviewed health habits including diet and exercise and skin cancer prevention Reviewed appropriate screening tests for age  Also reviewed health mt list, fam hx and immunization status , as well as social and family history    See HPI Labs reviewed       Hyperglycemia    Lab Results  Component Value Date   HGBA1C 6.3 01/17/2014    Disc imp of low glycemic diet and wt loss for DM prevention

## 2014-01-25 ENCOUNTER — Ambulatory Visit (INDEPENDENT_AMBULATORY_CARE_PROVIDER_SITE_OTHER): Payer: Medicare HMO

## 2014-01-25 DIAGNOSIS — J309 Allergic rhinitis, unspecified: Secondary | ICD-10-CM

## 2014-01-26 NOTE — Assessment & Plan Note (Signed)
bp in fair control at this time  BP Readings from Last 1 Encounters:  01/24/14 128/85   No changes needed Disc lifstyle change with low sodium diet and exercise   Labs reviewed

## 2014-01-26 NOTE — Assessment & Plan Note (Signed)
Reviewed health habits including diet and exercise and skin cancer prevention Reviewed appropriate screening tests for age  Also reviewed health mt list, fam hx and immunization status , as well as social and family history   See HPI Labs reviewed  

## 2014-01-26 NOTE — Assessment & Plan Note (Signed)
Disc symptomatic care - see instructions on AVS  Tessalon for cough Re assuring exam  Disc s/s of sinusitis to watch for  Update if not starting to improve in a week or if worsening

## 2014-01-26 NOTE — Assessment & Plan Note (Signed)
Lab Results  Component Value Date   HGBA1C 6.3 01/17/2014    Disc imp of low glycemic diet and wt loss for DM prevention

## 2014-01-26 NOTE — Assessment & Plan Note (Signed)
Mild No  Clinical changes  Will re check at next visit with free T4 Disc what symptoms to watch for for hypothyroidism

## 2014-02-01 ENCOUNTER — Ambulatory Visit (INDEPENDENT_AMBULATORY_CARE_PROVIDER_SITE_OTHER): Payer: Medicare HMO

## 2014-02-01 DIAGNOSIS — J309 Allergic rhinitis, unspecified: Secondary | ICD-10-CM

## 2014-02-08 ENCOUNTER — Ambulatory Visit (INDEPENDENT_AMBULATORY_CARE_PROVIDER_SITE_OTHER): Payer: Medicare HMO

## 2014-02-08 DIAGNOSIS — J309 Allergic rhinitis, unspecified: Secondary | ICD-10-CM

## 2014-02-14 ENCOUNTER — Ambulatory Visit (INDEPENDENT_AMBULATORY_CARE_PROVIDER_SITE_OTHER): Payer: Medicare HMO

## 2014-02-14 DIAGNOSIS — J309 Allergic rhinitis, unspecified: Secondary | ICD-10-CM

## 2014-02-21 ENCOUNTER — Ambulatory Visit (INDEPENDENT_AMBULATORY_CARE_PROVIDER_SITE_OTHER): Payer: Medicare HMO

## 2014-02-21 DIAGNOSIS — J309 Allergic rhinitis, unspecified: Secondary | ICD-10-CM

## 2014-02-26 ENCOUNTER — Ambulatory Visit (INDEPENDENT_AMBULATORY_CARE_PROVIDER_SITE_OTHER): Payer: Medicare HMO

## 2014-02-26 DIAGNOSIS — J309 Allergic rhinitis, unspecified: Secondary | ICD-10-CM

## 2014-03-06 ENCOUNTER — Ambulatory Visit (INDEPENDENT_AMBULATORY_CARE_PROVIDER_SITE_OTHER): Payer: Medicare HMO

## 2014-03-06 DIAGNOSIS — J309 Allergic rhinitis, unspecified: Secondary | ICD-10-CM

## 2014-03-07 ENCOUNTER — Encounter: Payer: Self-pay | Admitting: Family Medicine

## 2014-03-07 ENCOUNTER — Telehealth: Payer: Self-pay

## 2014-03-07 ENCOUNTER — Ambulatory Visit (INDEPENDENT_AMBULATORY_CARE_PROVIDER_SITE_OTHER): Payer: Medicare HMO | Admitting: Family Medicine

## 2014-03-07 VITALS — BP 126/86 | HR 82 | Temp 98.7°F | Ht 67.0 in | Wt 203.1 lb

## 2014-03-07 DIAGNOSIS — J019 Acute sinusitis, unspecified: Secondary | ICD-10-CM

## 2014-03-07 MED ORDER — AMOXICILLIN-POT CLAVULANATE 875-125 MG PO TABS: 1.0000 | ORAL_TABLET | Freq: Two times a day (BID) | ORAL | Status: DC

## 2014-03-07 NOTE — Assessment & Plan Note (Signed)
Uri symptoms for a month with sinus pressure  Cover with augmentin  Disc symptomatic care - see instructions on AVS  Update if not starting to improve in a week or if worsening

## 2014-03-07 NOTE — Telephone Encounter (Signed)
I would like her to stop the methotrexate doses for the week she is on augmentin anyway (skip the next dose she has) Since she has an infection-that may be wise anyway

## 2014-03-07 NOTE — Patient Instructions (Signed)
You may have a smoldering sinus infection Drink lots of fluids and rest  Use tylenol of ibuprofen for sore throat  mucinex for congestion if needed Take augmentin as directed Update if not starting to improve in a week or if worsening

## 2014-03-07 NOTE — Telephone Encounter (Signed)
Christine at [facility] left v/m requesting cb [phone] re to drug interaction between augmentin and methotrexate. Christine request cb about changing abx.Please advise.

## 2014-03-07 NOTE — Progress Notes (Signed)
Pre visit review using our clinic review tool, if applicable. No additional management support is needed unless otherwise documented below in the visit note. 

## 2014-03-07 NOTE — Telephone Encounter (Signed)
Called patient and notified of Dr Marliss Coots comments.  Called the [employer] as well and spoken to [place] and notified her of Dr Marliss Coots comments.

## 2014-03-07 NOTE — Progress Notes (Signed)
Subjective:    Patient ID: [name], female    DOB: 1965-10-08, 49 y.o.   MRN: [id number]  HPI Here for upper resp symptoms   Was sick when she was here last time -for a month  Got better and then worse - starting Monday  Sore throat - does not feel like it has ever totally stopped hurting (little better today) Some congestion / fair amt  Ears feel stopped up at times  ? If sinus pain - her eyes are bothering her  Cough - mostly dry/ occ phlegm and mucous in throat   No fever   Tried sudafed and nyquil and advil and tylenol   Patient Active Problem List   Diagnosis Date Noted  . Viral URI with cough 01/24/2014  . Elevated TSH 01/24/2014  . Knee pain, right 10/01/2013  . Aphthous ulcer of tongue 03/09/2013  . Papule 03/02/2013  . Paronychia 02/10/2013  . Nasal septal deviation 01/03/2013  . Encounter for Medicare annual wellness exam 11/02/2012  . Hydradenitis 11/02/2012  . Hyperglycemia 03/23/2012  . Macular hole 03/23/2012  . Screening-pulmonary TB 03/23/2012  . Routine general medical examination at a health care facility 10/02/2011  . Iritis, recurrent 10/02/2011  . Tachycardia 10/02/2011  . Other screening mammogram 10/02/2011  . ADD (attention deficit disorder) 08/20/2010  . DYSHIDROTIC ECZEMA, HANDS 11/12/2009  . OBSTRUCTIVE SLEEP APNEA 11/19/2006  . DEPRESSION 09/29/2006  . Essential hypertension 09/29/2006  . Seasonal and perennial allergic rhinitis 09/29/2006  . ASTHMA 09/29/2006  . GERD 09/29/2006  . IBS 09/29/2006  . ENDOMETRIOSIS 09/29/2006  . FEMALE INFERTILITY 09/29/2006  . FIBROMYALGIA 09/29/2006  . INSOMNIA 09/29/2006   Past Medical History  Diagnosis Date  . Allergy     allergic rhinitis  . Asthma   . Depression   . GERD (gastroesophageal reflux disease)   . Hypertension   . Fibromyalgia   . Degenerative disc disease   . Sleep apnea   . Fx of fibula 2009  . IBS (irritable bowel syndrome)   . Chronic headaches   . Hemorrhoids     . Iritis, chronic   . Macular hole of right eye    Past Surgical History  Procedure Laterality Date  . Laparoscopy  08/2004    endometriosis - several   . Breast surgery      left nipple removed - benign tumor  . Anterior cruciate ligament repair  1996    small tear of ACL  . Bunionectomy    . Tonsillectomy    . Abdominal hysterectomy     History  Substance Use Topics  . Smoking status: Never Smoker   . Smokeless tobacco: Never Used  . Alcohol Use: No   Family History  Problem Relation Age of Onset  . Hypertension Mother   . Thyroid disease Mother   . Fibromyalgia Mother   . Diabetes Father   . Kidney cancer Maternal Grandmother   . Colon cancer Neg Hx   . Colon polyps Mother    Allergies  Allergen Reactions  . Cetirizine Hcl     REACTION: not effective  . Erythromycin     REACTION: stomach hurts  . Escitalopram Oxalate     REACTION: no improvement  . Lisinopril     REACTION: cough  . Loratadine     REACTION: not effective  . Nifedipine     REACTION: fatigued and tremor  . Sertraline Hcl     REACTION: no help  . Vyvanse [Lisdexamfetamine Dimesylate]  Headache and excitablility   Current Outpatient Prescriptions on File Prior to Visit  Medication Sig Dispense Refill  . acetaminophen (TYLENOL) 500 MG tablet OTC as directed.     . benzonatate (TESSALON) 200 MG capsule Take 1 capsule (200 mg total) by mouth 3 (three) times daily as needed for cough (swallow whole). 30 capsule 0  . buPROPion (WELLBUTRIN XL) 300 MG 24 hr tablet Take 1 tablet (300 mg total) by mouth daily. 30 tablet 0  . carvedilol (COREG) 6.25 MG tablet Take 1 tablet (6.25 mg total) by mouth 2 (two) times daily with a meal. 180 tablet 0  . diphenhydrAMINE (BENADRYL) 25 mg capsule OTC as directed.     . folic acid (FOLVITE) 1 MG tablet Take 2 mg by mouth daily.     Marland Kitchen losartan (COZAAR) 50 MG tablet Take 1 tablet (50 mg total) by mouth daily. 30 tablet 11  . methocarbamol (ROBAXIN) 500 MG tablet  TAKE 1 TABLET (500 MG TOTAL) BY MOUTH AT BEDTIME AS NEEDED. 30 tablet 1  . methotrexate (RHEUMATREX) 2.5 MG tablet Take 25 mg by mouth once a week. Caution:Chemotherapy. Protect from light.    . montelukast (SINGULAIR) 10 MG tablet Take 1 tablet (10 mg total) by mouth at bedtime. 90 tablet 3  . prednisoLONE acetate (PRED FORTE) 1 % ophthalmic suspension Place 1 drop into both eyes daily.     Marland Kitchen PROAIR HFA 108 (90 BASE) MCG/ACT inhaler INHALE 2 PUFFS EVERY 4 HOURS AS NEEDED FOR WHEEZING 8.5 each 3  . traMADol (ULTRAM) 50 MG tablet TAKE 1 TABLET (50 MG TOTAL) BY MOUTH EVERY 8 (EIGHT) HOURS AS NEEDED FOR PAIN. 30 tablet 1   No current facility-administered medications on file prior to visit.       Review of Systems Review of Systems  Constitutional: Negative for fever, appetite change,  and unexpected weight change.  ENT pos for cong and rhinorrhea and ST and drip  Eyes: Negative for pain and visual disturbance.  Respiratory: Negative for wheeze  and shortness of breath.   Cardiovascular: Negative for cp or palpitations    Gastrointestinal: Negative for nausea, diarrhea and constipation.  Genitourinary: Negative for urgency and frequency.  Skin: Negative for pallor or rash   Neurological: Negative for weakness, light-headedness, numbness and headaches.  Hematological: Negative for adenopathy. Does not bruise/bleed easily.  Psychiatric/Behavioral: Negative for dysphoric mood. The patient is not nervous/anxious.         Objective:   Physical Exam  Constitutional: She appears well-developed and well-nourished. No distress.  obese and well appearing   HENT:  Head: Normocephalic and atraumatic.  Right Ear: External ear normal.  Left Ear: External ear normal.  Mouth/Throat: Oropharynx is clear and moist. No oropharyngeal exudate.  Nares are injected and congested  No sinus tenderness Throat is mildly injected   Eyes: Conjunctivae and EOM are normal. Pupils are equal, round, and reactive  to light. Right eye exhibits no discharge. Left eye exhibits no discharge.  Neck: Normal range of motion. Neck supple.  Cardiovascular: Normal rate and regular rhythm.   Pulmonary/Chest: Effort normal and breath sounds normal. No respiratory distress. She has no wheezes. She has no rales.  Lymphadenopathy:    She has no cervical adenopathy.  Skin: Skin is warm and dry. No pallor.  Psychiatric: She has a normal mood and affect.          Assessment & Plan:   Problem List Items Addressed This Visit      Respiratory  Acute sinusitis - Primary    Uri symptoms for a month with sinus pressure  Cover with augmentin  Disc symptomatic care - see instructions on AVS  Update if not starting to improve in a week or if worsening        Relevant Medications   amoxicillin-clavulanate (AUGMENTIN) tablet 875-125 mg

## 2014-03-12 ENCOUNTER — Ambulatory Visit (INDEPENDENT_AMBULATORY_CARE_PROVIDER_SITE_OTHER): Payer: Medicare HMO

## 2014-03-12 DIAGNOSIS — J309 Allergic rhinitis, unspecified: Secondary | ICD-10-CM

## 2014-03-16 ENCOUNTER — Encounter: Payer: Self-pay | Admitting: Internal Medicine

## 2014-03-19 ENCOUNTER — Ambulatory Visit (INDEPENDENT_AMBULATORY_CARE_PROVIDER_SITE_OTHER): Payer: Medicare HMO

## 2014-03-19 DIAGNOSIS — J309 Allergic rhinitis, unspecified: Secondary | ICD-10-CM

## 2014-03-26 ENCOUNTER — Ambulatory Visit (INDEPENDENT_AMBULATORY_CARE_PROVIDER_SITE_OTHER): Payer: Medicare HMO

## 2014-03-26 DIAGNOSIS — J309 Allergic rhinitis, unspecified: Secondary | ICD-10-CM

## 2014-03-28 ENCOUNTER — Telehealth: Payer: Self-pay | Admitting: Internal Medicine

## 2014-03-28 DIAGNOSIS — G4733 Obstructive sleep apnea (adult) (pediatric): Secondary | ICD-10-CM

## 2014-03-28 NOTE — Telephone Encounter (Signed)
Patient needs new mask and new hose for CPAP machine, wants to switch from [place] to [facility] in [place].  To Dr. Annamaria Boots, okay to put in order for new supplies and switch DME?   Please advise.

## 2014-03-29 NOTE — Telephone Encounter (Signed)
Ok to order these changes and document. Continue current pressure, mask of choice, humidifier and supplies for dx OSA

## 2014-03-29 NOTE — Telephone Encounter (Signed)
Order will be placed per CY. Pt is aware that we will take care of this for her. Nothing further was needed.

## 2014-04-03 ENCOUNTER — Ambulatory Visit (INDEPENDENT_AMBULATORY_CARE_PROVIDER_SITE_OTHER): Payer: Medicare HMO

## 2014-04-03 DIAGNOSIS — J309 Allergic rhinitis, unspecified: Secondary | ICD-10-CM | POA: Diagnosis not present

## 2014-04-09 ENCOUNTER — Other Ambulatory Visit: Payer: Self-pay | Admitting: Family Medicine

## 2014-04-11 ENCOUNTER — Ambulatory Visit (INDEPENDENT_AMBULATORY_CARE_PROVIDER_SITE_OTHER): Payer: Medicare HMO

## 2014-04-11 DIAGNOSIS — J309 Allergic rhinitis, unspecified: Secondary | ICD-10-CM

## 2014-04-16 ENCOUNTER — Ambulatory Visit (INDEPENDENT_AMBULATORY_CARE_PROVIDER_SITE_OTHER): Payer: Medicare HMO | Admitting: Family Medicine

## 2014-04-16 ENCOUNTER — Encounter: Payer: Self-pay | Admitting: Family Medicine

## 2014-04-16 ENCOUNTER — Ambulatory Visit (INDEPENDENT_AMBULATORY_CARE_PROVIDER_SITE_OTHER)
Admission: RE | Admit: 2014-04-16 | Discharge: 2014-04-16 | Disposition: A | Discharge status: Home / Self Care | Payer: Medicare HMO | Source: Ambulatory Visit | Attending: Family Medicine | Admitting: Family Medicine

## 2014-04-16 VITALS — BP 130/80 | HR 94 | Temp 98.8°F | Wt 205.1 lb

## 2014-04-16 DIAGNOSIS — M79671 Pain in right foot: Secondary | ICD-10-CM | POA: Insufficient documentation

## 2014-04-16 NOTE — Progress Notes (Signed)
Subjective:    Patient ID: [name], female    DOB: [birth year], 49 y.o.   MRN: [id number]  HPI Here for a foot injury R foot  10 days ago  Ran her foot into a concrete pylon in a parking lot (while carrying many things)   After the injury - swelling started pretty quickly -while driving  Taking tylenol and advil  Using ice and epsom salt baths  Tried to wrap her foot in ACE bandage - and it made it hurt worse    Never stopped hurting- only a little improvement  More painful yesterday   Painful and swollen between first and 2nd toe  Also pain in lateral foot  Ankle does not hurt   Patient Active Problem List   Diagnosis Date Noted  . Acute sinusitis 03/07/2014  . Viral URI with cough 01/24/2014  . Elevated TSH 01/24/2014  . Knee pain, right 10/01/2013  . Aphthous ulcer of tongue 03/09/2013  . Papule 03/02/2013  . Paronychia 02/10/2013  . Nasal septal deviation 01/03/2013  . Encounter for Medicare annual wellness exam 11/02/2012  . Hydradenitis 11/02/2012  . Hyperglycemia 03/23/2012  . Macular hole 03/23/2012  . Screening-pulmonary TB 03/23/2012  . Routine general medical examination at a health care facility 10/02/2011  . Iritis, recurrent 10/02/2011  . Tachycardia 10/02/2011  . Other screening mammogram 10/02/2011  . ADD (attention deficit disorder) 08/20/2010  . DYSHIDROTIC ECZEMA, HANDS 11/12/2009  . OBSTRUCTIVE SLEEP APNEA 11/19/2006  . DEPRESSION 09/29/2006  . Essential hypertension 09/29/2006  . Seasonal and perennial allergic rhinitis 09/29/2006  . ASTHMA 09/29/2006  . GERD 09/29/2006  . IBS 09/29/2006  . ENDOMETRIOSIS 09/29/2006  . FEMALE INFERTILITY 09/29/2006  . FIBROMYALGIA 09/29/2006  . INSOMNIA 09/29/2006   Past Medical History  Diagnosis Date  . Allergy     allergic rhinitis  . Asthma   . Depression   . GERD (gastroesophageal reflux disease)   . Hypertension   . Fibromyalgia   . Degenerative disc disease   . Sleep apnea   . Fx of  fibula 2009  . IBS (irritable bowel syndrome)   . Chronic headaches   . Hemorrhoids   . Iritis, chronic   . Macular hole of right eye    Past Surgical History  Procedure Laterality Date  . Laparoscopy  08/2004    endometriosis - several   . Breast surgery      left nipple removed - benign tumor  . Anterior cruciate ligament repair  1996    small tear of ACL  . Bunionectomy    . Tonsillectomy    . Abdominal hysterectomy     History  Substance Use Topics  . Smoking status: Never Smoker   . Smokeless tobacco: Never Used  . Alcohol Use: No   Family History  Problem Relation Age of Onset  . Hypertension Mother   . Thyroid disease Mother   . Fibromyalgia Mother   . Diabetes Father   . Kidney cancer Maternal Grandmother   . Colon cancer Neg Hx   . Colon polyps Mother    Allergies  Allergen Reactions  . Cetirizine Hcl     REACTION: not effective  . Erythromycin     REACTION: stomach hurts  . Escitalopram Oxalate     REACTION: no improvement  . Lisinopril     REACTION: cough  . Loratadine     REACTION: not effective  . Nifedipine     REACTION: fatigued and tremor  .  Sertraline Hcl     REACTION: no help  . Vyvanse [Lisdexamfetamine Dimesylate]     Headache and excitablility   Current Outpatient Prescriptions on File Prior to Visit  Medication Sig Dispense Refill  . acetaminophen (TYLENOL) 500 MG tablet OTC as directed.     . benzonatate (TESSALON) 200 MG capsule Take 1 capsule (200 mg total) by mouth 3 (three) times daily as needed for cough (swallow whole). 30 capsule 0  . buPROPion (WELLBUTRIN XL) 300 MG 24 hr tablet TAKE ONE TABLET BY MOUTH ONCE DAILY 90 tablet 3  . carvedilol (COREG) 6.25 MG tablet Take 1 tablet (6.25 mg total) by mouth 2 (two) times daily with a meal. 180 tablet 0  . diphenhydrAMINE (BENADRYL) 25 mg capsule OTC as directed.     . folic acid (FOLVITE) 1 MG tablet Take 2 mg by mouth daily.     Marland Kitchen losartan (COZAAR) 50 MG tablet Take 1 tablet (50  mg total) by mouth daily. 30 tablet 11  . methocarbamol (ROBAXIN) 500 MG tablet TAKE 1 TABLET (500 MG TOTAL) BY MOUTH AT BEDTIME AS NEEDED. 30 tablet 1  . methotrexate (RHEUMATREX) 2.5 MG tablet Take 25 mg by mouth once a week. Caution:Chemotherapy. Protect from light.    . montelukast (SINGULAIR) 10 MG tablet Take 1 tablet (10 mg total) by mouth at bedtime. 90 tablet 3  . prednisoLONE acetate (PRED FORTE) 1 % ophthalmic suspension Place 1 drop into both eyes daily.     Marland Kitchen PROAIR HFA 108 (90 BASE) MCG/ACT inhaler INHALE 2 PUFFS EVERY 4 HOURS AS NEEDED FOR WHEEZING 8.5 each 3  . traMADol (ULTRAM) 50 MG tablet TAKE 1 TABLET (50 MG TOTAL) BY MOUTH EVERY 8 (EIGHT) HOURS AS NEEDED FOR PAIN. 30 tablet 1   No current facility-administered medications on file prior to visit.        Review of Systems Review of Systems  Constitutional: Negative for fever, appetite change, fatigue and unexpected weight change.  Eyes: Negative for pain and visual disturbance.  Respiratory: Negative for cough and shortness of breath.   Cardiovascular: Negative for cp or palpitations    Gastrointestinal: Negative for nausea, diarrhea and constipation.  Genitourinary: Negative for urgency and frequency.  Skin: Negative for pallor or rash   MSK pos for R foot pain and swelling  Neurological: Negative for weakness, light-headedness, numbness and headaches.  Hematological: Negative for adenopathy. Does not bruise/bleed easily.  Psychiatric/Behavioral: Negative for dysphoric mood. The patient is not nervous/anxious.         Objective:   Physical Exam  Constitutional: She appears well-developed and well-nourished.  obese and well appearing   Cardiovascular: Normal rate and regular rhythm.   Pulmonary/Chest: Effort normal and breath sounds normal.  Musculoskeletal: She exhibits edema and tenderness.       Right foot: There is tenderness, bony tenderness and swelling. There is normal range of motion, normal capillary  refill, no crepitus, no deformity and no laceration.  Tender R foot dorsally between 1st and 2nd metatarsals (with edema) Tender over proximal 5th metatarsal (also swollen) No ecchymosis  Nl rom foot -pain with full plantar flexion  Nl sens and perf   Neurological: She is alert. She has normal strength and normal reflexes. She displays no atrophy. No sensory deficit.  Skin: Skin is warm and dry. No rash noted. No erythema.  Psychiatric: She has a normal mood and affect.          Assessment & Plan:   Problem List  Items Addressed This Visit      Other   Right foot pain - Primary    After stubbing type of injury  Lateral foot and area between 1st and 2nd metatarsals are tender and swollen Xray today and then plan with result  Surgical shoe given to wear with ambulation  Disc use of nsaid/ice and elevation       Relevant Orders   DG Foot Complete Right (Completed)

## 2014-04-16 NOTE — Progress Notes (Signed)
Pre visit review using our clinic review tool, if applicable. No additional management support is needed unless otherwise documented below in the visit note. 

## 2014-04-16 NOTE — Patient Instructions (Signed)
Xray of foot today You can take up to 800 mg of ibuprofen over the counter with foot up to every 8 hours if it does not bother stomach  Use ice when you can Elevate when you can   Try to use the shoe/boot when able

## 2014-04-16 NOTE — Assessment & Plan Note (Signed)
After stubbing type of injury  Lateral foot and area between 1st and 2nd metatarsals are tender and swollen Xray today and then plan with result  Surgical shoe given to wear with ambulation  Disc use of nsaid/ice and elevation

## 2014-04-17 ENCOUNTER — Ambulatory Visit (INDEPENDENT_AMBULATORY_CARE_PROVIDER_SITE_OTHER): Payer: Medicare HMO

## 2014-04-17 DIAGNOSIS — J309 Allergic rhinitis, unspecified: Secondary | ICD-10-CM | POA: Diagnosis not present

## 2014-04-18 ENCOUNTER — Ambulatory Visit (INDEPENDENT_AMBULATORY_CARE_PROVIDER_SITE_OTHER): Payer: Medicare HMO

## 2014-04-18 DIAGNOSIS — J309 Allergic rhinitis, unspecified: Secondary | ICD-10-CM | POA: Diagnosis not present

## 2014-04-22 ENCOUNTER — Encounter: Payer: Self-pay | Admitting: Family Medicine

## 2014-04-24 ENCOUNTER — Ambulatory Visit (INDEPENDENT_AMBULATORY_CARE_PROVIDER_SITE_OTHER): Payer: Medicare HMO

## 2014-04-24 DIAGNOSIS — J309 Allergic rhinitis, unspecified: Secondary | ICD-10-CM | POA: Diagnosis not present

## 2014-05-01 ENCOUNTER — Ambulatory Visit (INDEPENDENT_AMBULATORY_CARE_PROVIDER_SITE_OTHER): Payer: Medicare HMO

## 2014-05-01 DIAGNOSIS — J309 Allergic rhinitis, unspecified: Secondary | ICD-10-CM

## 2014-05-07 ENCOUNTER — Ambulatory Visit (INDEPENDENT_AMBULATORY_CARE_PROVIDER_SITE_OTHER): Payer: Medicare HMO

## 2014-05-07 DIAGNOSIS — J309 Allergic rhinitis, unspecified: Secondary | ICD-10-CM

## 2014-05-15 ENCOUNTER — Ambulatory Visit (INDEPENDENT_AMBULATORY_CARE_PROVIDER_SITE_OTHER): Payer: Medicare HMO

## 2014-05-15 DIAGNOSIS — J309 Allergic rhinitis, unspecified: Secondary | ICD-10-CM | POA: Diagnosis not present

## 2014-05-22 ENCOUNTER — Ambulatory Visit (INDEPENDENT_AMBULATORY_CARE_PROVIDER_SITE_OTHER): Payer: Medicare HMO

## 2014-05-22 DIAGNOSIS — J309 Allergic rhinitis, unspecified: Secondary | ICD-10-CM

## 2014-05-29 ENCOUNTER — Ambulatory Visit (INDEPENDENT_AMBULATORY_CARE_PROVIDER_SITE_OTHER): Payer: Medicare HMO

## 2014-05-29 DIAGNOSIS — J309 Allergic rhinitis, unspecified: Secondary | ICD-10-CM | POA: Diagnosis not present

## 2014-06-06 ENCOUNTER — Ambulatory Visit (INDEPENDENT_AMBULATORY_CARE_PROVIDER_SITE_OTHER): Payer: Medicare HMO

## 2014-06-06 DIAGNOSIS — J309 Allergic rhinitis, unspecified: Secondary | ICD-10-CM

## 2014-06-08 ENCOUNTER — Other Ambulatory Visit: Payer: Self-pay | Admitting: *Deleted

## 2014-06-08 MED ORDER — CARVEDILOL 6.25 MG PO TABS: 6.2500 mg | ORAL_TABLET | Freq: Two times a day (BID) | ORAL | Status: DC

## 2014-06-14 ENCOUNTER — Encounter: Payer: Self-pay | Admitting: Internal Medicine

## 2014-06-14 ENCOUNTER — Ambulatory Visit (INDEPENDENT_AMBULATORY_CARE_PROVIDER_SITE_OTHER): Payer: Medicare HMO

## 2014-06-14 ENCOUNTER — Ambulatory Visit (INDEPENDENT_AMBULATORY_CARE_PROVIDER_SITE_OTHER): Payer: Medicare HMO | Admitting: Internal Medicine

## 2014-06-14 VITALS — BP 110/84 | HR 73 | Ht 68.0 in | Wt 206.0 lb

## 2014-06-14 DIAGNOSIS — J4520 Mild intermittent asthma, uncomplicated: Secondary | ICD-10-CM | POA: Diagnosis not present

## 2014-06-14 DIAGNOSIS — G4733 Obstructive sleep apnea (adult) (pediatric): Secondary | ICD-10-CM

## 2014-06-14 DIAGNOSIS — J3089 Other allergic rhinitis: Secondary | ICD-10-CM

## 2014-06-14 DIAGNOSIS — J309 Allergic rhinitis, unspecified: Secondary | ICD-10-CM | POA: Diagnosis not present

## 2014-06-14 DIAGNOSIS — J302 Other seasonal allergic rhinitis: Secondary | ICD-10-CM

## 2014-06-14 MED ORDER — ALBUTEROL SULFATE HFA 108 (90 BASE) MCG/ACT IN AERS: INHALATION_SPRAY | RESPIRATORY_TRACT | Status: DC

## 2014-06-14 NOTE — Progress Notes (Signed)
Patient ID: [name], female    DOB: 02-24-1965, 49 y.o.   MRN: [id number]  HPI 27 yoF followed for obstructive sleep apnea and allergic rhinitis, complicated by insomnia, GERD, depression. Last here March 9 when we gave depo shot which made her much better. She was able to go back to full time use of her CPAP machine - 11 cwp Advanced. We discussed her meds. She feels well today.   11/06/10- 59 yoF followed for obstructive sleep apnea and allergic rhinitis, complicated by insomnia, GERD, depression. Has had flu vaccine. Had sustained head congestion and productive cough one month ago. Her primary physician treated Augmentin and cough syrup. She was clear after that until 4 days ago. One day after her flu shot, she woke with scratchy throat, no fever, increased head congestion.Unable to use CPAP while her head is congested.her ophthalmologist has asked her to get allergy testing done.  Years ago, allergy testing was positive and she was on allergy vaccine for a while. She does not remember details or response.  01/07/11-  33 yoF followed for obstructive sleep apnea and allergic rhinitis, complicated by insomnia, GERD, depression. Comes today for allergy skin testing off antihistamines. No recent respiratory infections. Wheeze very occasionally with exertion.  Anticipates seasonal flare as February pollens start. Had flu vaccine. Uses CPAP regularly "as long as I can breathe". Allergy Skin tests- significant positive reactions for numerous, and inhalant allergens, particularly grass weed and tree pollens, dust mite, cat and some molds. She wishes to start allergy vaccine.  03/12/11-  46 yoF followed for obstructive sleep apnea and allergic rhinitis, complicated by insomnia, GERD, depression. Wearing CPAP less consistently in the last 2 weeks because of nasal congestion. Building allergy vaccine without problems. Taking Allegra-D plus Sudafed. We discussed this. Using saline nasal rinse. Continues  Advair 250. Using rescue inhaler about twice a week.  07/13/11- 46 yoF followed for obstructive sleep apnea and allergic rhinitis, complicated by insomnia, GERD, depression. Having reactions in left arm only. We are holding her allergy vaccine at 1:50/0.4 mL. CPAP 11/Advanced-working very well for her. She describes good compliance and control with no problems.  10/13/11- 73 yoF followed for obstructive sleep apnea and allergic rhinitis, complicated by insomnia, GERD, depression. On vaccine(no reaction for about 3 wks and then started today)itchy red area.  Has had flu vaccine. Holding at 1:50, 0.3 mL's. She does say she considers herself better off on allergy vaccine. Her new eye doctor asked her to see me with question of whether allergic sinus problems could be related to 3 episodes of iritis. She is now on steroid eyedrops. She continues daily Zyrtec and Singulair with Benadryl at night.  02/15/12-46 yoF followed for obstructive sleep apnea and allergic rhinitis, complicated by insomnia, GERD, depression. FOLLOWS FOR: on allergy vaccine 1:50 0.3 mL GH and still having slight reactions; got shot today and slight redness around injection site. Minor local reaction occasionally to her allergy shots. She mentions these when asked but says they don't bother her at all. Still occasional episode of iritis, one eye or the other, with no pattern. Continues doing well with CPAP 11/Advanced used all night every night. CXR 11/02/11 IMPRESSION:  Negative  Original Report Authenticated By: Truett Perna, M.D.  06/13/12- 46 yoF followed for obstructive sleep apnea and allergic rhinitis, complicated by insomnia, GERD, depression. On allergy on vaccine.  No problems at this time.   Wearing cpap11/Advanced everynight for approx 6-8 hours.  No problems with mask  or pressure. Had local reaction after reaching vaccine concentration 1:10 GH, so back down to 1:50 for now. We discussed risk benefit and management of  reactions. Check replacement for CPAP machine which has developed an odor. She also needs a chin strap because her mouth dries out when wearing nasal CPAP.  12/12/12- 81 yoF followed for obstructive sleep apnea and allergic rhinitis, complicated by insomnia, GERD, depression. FOLLOWS FOR:  Wearing CPAP 11/ Advanced 6-8 hours per night.   Still doing well on Allergy vaccine 1:50 [place] She likes her new CPAP machine with nasal pillows mask  12/12/13- 56 yoF followed for obstructive sleep apnea and allergic rhinitis, complicated by insomnia, GERD, depression, iritis. FOLLOWS FOR: wears cpap 11/ [employer] 6-8 hours nightly, no complaints with pressure/supplies.  Tolerating allergy vaccines 1:50 GH well.   06/14/14- 68 yoF followed for obstructive sleep apnea and allergic rhinitis, asthma, complicated by insomnia, GERD, depression. Follows For: Pt states she wears CPAP 11 Advanced 6-8 hours nightly and states mask fits fine. Tolerating Allergy vaccine 1:50 GH well. Needs to stay at 0.4 mL/vial to avoid local reaction. Rare need for ProAir her rescue inhaler. Asthma does not wake her.  [facility] treating with methotrexate tablets for chronic iritis.  Review of Systems- see HPI Constitutional:   No-   weight loss, night sweats, fevers, chills, fatigue, lassitude. HEENT:   No-  headaches, difficulty swallowing, tooth/dental problems, sore throat,       No- sneezing, itching, ear ache, +nasal congestion, little post nasal drip,  CV:  No-   chest pain, orthopnea, PND, swelling in lower extremities, anasarca, dizziness, palpitations Resp: No-   shortness of breath with exertion or at rest.              No-   productive cough, occasional non-productive cough,  No- coughing up of blood.              No-   change in color of mucus.  No- wheezing.   Skin: No-   rash or lesions. GI:  No-   heartburn, indigestion, abdominal pain, nausea, vomiting,  GU: MS:   Neuro-     nothing unusual Psych:  No- change in  mood or affect. No depression or anxiety.  No memory loss.  Objective:   Physical Exam General- Alert, Oriented, Affect-appropriate, Distress- none acute Skin- +keratosis pilaris Lymphadenopathy- none Head- atraumatic            Eyes- Gross vision intact, PERRLA, conjunctivae clear secretions            Ears- Hearing, canals-normal            Nose- mucosa + pale/puffy, +turbinate edema, +Septal dev,no- polyps, erosion, perforation             Throat- Mallampati II-III , mucosa clear , drainage- none, tonsils- atrophic Neck- flexible , trachea midline, no stridor , thyroid nl, carotid no bruit Chest - symmetrical excursion , unlabored           Heart/CV- RRR , no murmur , no gallop  , no rub, nl s1 s2                           - JVD- none , edema- none, stasis changes- none, varices- none           Lung- clear to P&A, wheeze- none, cough- none  , dullness-none, rub- none  Chest wall-  Abd-  Br/ Gen/ Rectal- Not done, not indicated Extrem- cyanosis- none, clubbing, none, atrophy- none, strength- nl Neuro- grossly intact to observation

## 2014-06-14 NOTE — Patient Instructions (Signed)
We can continue allergy vaccine at 1:50 [facility]  We can continue CPAP 11/ Advanced  Script sent refilling Proair  Order- DME Advanced CPAP download for pressure compliance  Please call as needed

## 2014-06-20 ENCOUNTER — Ambulatory Visit: Payer: Medicare HMO

## 2014-06-27 ENCOUNTER — Ambulatory Visit (INDEPENDENT_AMBULATORY_CARE_PROVIDER_SITE_OTHER): Payer: Medicare HMO

## 2014-06-27 DIAGNOSIS — J309 Allergic rhinitis, unspecified: Secondary | ICD-10-CM | POA: Diagnosis not present

## 2014-07-04 ENCOUNTER — Ambulatory Visit (INDEPENDENT_AMBULATORY_CARE_PROVIDER_SITE_OTHER): Payer: Medicare HMO

## 2014-07-04 DIAGNOSIS — J309 Allergic rhinitis, unspecified: Secondary | ICD-10-CM | POA: Diagnosis not present

## 2014-07-11 ENCOUNTER — Ambulatory Visit: Payer: Medicare HMO

## 2014-07-16 NOTE — Assessment & Plan Note (Signed)
She is alone with no reporter. We will get a download for documentation but it sounds as if she is doing very well.

## 2014-07-16 NOTE — Assessment & Plan Note (Signed)
Occasional use of rescue inhaler is sufficient now.

## 2014-07-16 NOTE — Assessment & Plan Note (Signed)
She continues  allergy vaccine at current concentration and dose. We will watch for decision how long to continue.

## 2014-07-17 ENCOUNTER — Telehealth: Payer: Self-pay | Admitting: Family Medicine

## 2014-07-17 DIAGNOSIS — R7989 Other specified abnormal findings of blood chemistry: Secondary | ICD-10-CM

## 2014-07-17 DIAGNOSIS — R739 Hyperglycemia, unspecified: Secondary | ICD-10-CM

## 2014-07-17 NOTE — Telephone Encounter (Signed)
-----   Message from Ellamae Sia sent at 07/11/2014  3:58 PM EDT ----- Regarding: Lab orders for Wednesday,7.13.16 Lab orders for a 6 month f/u

## 2014-07-18 ENCOUNTER — Other Ambulatory Visit (INDEPENDENT_AMBULATORY_CARE_PROVIDER_SITE_OTHER): Payer: Medicare HMO

## 2014-07-18 ENCOUNTER — Ambulatory Visit (INDEPENDENT_AMBULATORY_CARE_PROVIDER_SITE_OTHER): Payer: Medicare HMO

## 2014-07-18 DIAGNOSIS — J309 Allergic rhinitis, unspecified: Secondary | ICD-10-CM | POA: Diagnosis not present

## 2014-07-18 DIAGNOSIS — R739 Hyperglycemia, unspecified: Secondary | ICD-10-CM | POA: Diagnosis not present

## 2014-07-18 DIAGNOSIS — R946 Abnormal results of thyroid function studies: Secondary | ICD-10-CM | POA: Diagnosis not present

## 2014-07-18 DIAGNOSIS — R7989 Other specified abnormal findings of blood chemistry: Secondary | ICD-10-CM

## 2014-07-18 LAB — TSH: TSH: 4.9 u[IU]/mL — AB (ref 0.35–4.50)

## 2014-07-18 LAB — HEMOGLOBIN A1C: HEMOGLOBIN A1C: 6.1 % (ref 4.6–6.5)

## 2014-07-18 LAB — T4, FREE: Free T4: 0.75 ng/dL (ref 0.60–1.60)

## 2014-07-25 ENCOUNTER — Ambulatory Visit (INDEPENDENT_AMBULATORY_CARE_PROVIDER_SITE_OTHER): Payer: Medicare HMO

## 2014-07-25 ENCOUNTER — Encounter: Payer: Self-pay | Admitting: Family Medicine

## 2014-07-25 ENCOUNTER — Ambulatory Visit (INDEPENDENT_AMBULATORY_CARE_PROVIDER_SITE_OTHER): Payer: Medicare HMO | Admitting: Family Medicine

## 2014-07-25 VITALS — BP 136/92 | HR 81 | Temp 98.7°F | Ht 66.5 in | Wt 213.2 lb

## 2014-07-25 DIAGNOSIS — R739 Hyperglycemia, unspecified: Secondary | ICD-10-CM

## 2014-07-25 DIAGNOSIS — J309 Allergic rhinitis, unspecified: Secondary | ICD-10-CM | POA: Diagnosis not present

## 2014-07-25 DIAGNOSIS — I10 Essential (primary) hypertension: Secondary | ICD-10-CM

## 2014-07-25 DIAGNOSIS — R7989 Other specified abnormal findings of blood chemistry: Secondary | ICD-10-CM

## 2014-07-25 DIAGNOSIS — R946 Abnormal results of thyroid function studies: Secondary | ICD-10-CM

## 2014-07-25 NOTE — Progress Notes (Signed)
Pre visit review using our clinic review tool, if applicable. No additional management support is needed unless otherwise documented below in the visit note. 

## 2014-07-25 NOTE — Progress Notes (Signed)
Subjective:    Patient ID: [name], female    DOB: Jul 02, 1965, 49 y.o.   MRN: [id number]  HPI Here for f/u of chronic medical problems   Wt is up 7 lb with bmi of 33 Not changed on her scales - clothes fit ok - sounds like our scale is wrong   Hyperglycemia Lab Results  Component Value Date   HGBA1C 6.1 07/18/2014  she has switched to water/unsweet tea and occ diet drinks ( got rid of sugar in drinks)  Still a sweet eater  This is down from 6.3  Has done some water exercise    bp up a bit today -forgot her medication  No cp or palpitations or headaches or edema  No side effects to medicines  BP Readings from Last 3 Encounters:  07/25/14 136/92  06/14/14 110/84  04/16/14 130/80     Last visit tsh slt high Re check  Lab Results  Component Value Date   TSH 4.90* 07/18/2014   T4 is normal at .75   Patient Active Problem List   Diagnosis Date Noted  . Right foot pain 04/16/2014  . Acute sinusitis 03/07/2014  . Viral URI with cough 01/24/2014  . Elevated TSH 01/24/2014  . Knee pain, right 10/01/2013  . Aphthous ulcer of tongue 03/09/2013  . Papule 03/02/2013  . Paronychia 02/10/2013  . Nasal septal deviation 01/03/2013  . Encounter for Medicare annual wellness exam 11/02/2012  . Hydradenitis 11/02/2012  . Hyperglycemia 03/23/2012  . Macular hole 03/23/2012  . Screening-pulmonary TB 03/23/2012  . Routine general medical examination at a health care facility 10/02/2011  . Iritis, recurrent 10/02/2011  . Tachycardia 10/02/2011  . Other screening mammogram 10/02/2011  . ADD (attention deficit disorder) 08/20/2010  . DYSHIDROTIC ECZEMA, HANDS 11/12/2009  . Obstructive sleep apnea 11/19/2006  . DEPRESSION 09/29/2006  . Essential hypertension 09/29/2006  . Seasonal and perennial allergic rhinitis 09/29/2006  . Asthma, mild intermittent, well-controlled 09/29/2006  . GERD 09/29/2006  . IBS 09/29/2006  . ENDOMETRIOSIS 09/29/2006  . FEMALE INFERTILITY  09/29/2006  . FIBROMYALGIA 09/29/2006  . INSOMNIA 09/29/2006   Past Medical History  Diagnosis Date  . Allergy     allergic rhinitis  . Asthma   . Depression   . GERD (gastroesophageal reflux disease)   . Hypertension   . Fibromyalgia   . Degenerative disc disease   . Sleep apnea   . Fx of fibula 2009  . IBS (irritable bowel syndrome)   . Chronic headaches   . Hemorrhoids   . Iritis, chronic   . Macular hole of right eye    Past Surgical History  Procedure Laterality Date  . Laparoscopy  08/2004    endometriosis - several   . Breast surgery      left nipple removed - benign tumor  . Anterior cruciate ligament repair  1996    small tear of ACL  . Bunionectomy    . Tonsillectomy    . Abdominal hysterectomy     History  Substance Use Topics  . Smoking status: Never Smoker   . Smokeless tobacco: Never Used  . Alcohol Use: No   Family History  Problem Relation Age of Onset  . Hypertension Mother   . Thyroid disease Mother   . Fibromyalgia Mother   . Diabetes Father   . Kidney cancer Maternal Grandmother   . Colon cancer Neg Hx   . Colon polyps Mother    Allergies  Allergen Reactions  .  Cetirizine Hcl     REACTION: not effective  . Erythromycin     REACTION: stomach hurts  . Escitalopram Oxalate     REACTION: no improvement  . Lisinopril     REACTION: cough  . Loratadine     REACTION: not effective  . Nifedipine     REACTION: fatigued and tremor  . Sertraline Hcl     REACTION: no help  . Vyvanse [Lisdexamfetamine Dimesylate]     Headache and excitablility   Current Outpatient Prescriptions on File Prior to Visit  Medication Sig Dispense Refill  . acetaminophen (TYLENOL) 500 MG tablet OTC as directed.     Marland Kitchen albuterol (PROAIR HFA) 108 (90 BASE) MCG/ACT inhaler INHALE 2 PUFFS EVERY 4 HOURS AS NEEDED FOR WHEEZING 1 Inhaler 11  . buPROPion (WELLBUTRIN XL) 300 MG 24 hr tablet TAKE ONE TABLET BY MOUTH ONCE DAILY 90 tablet 3  . carvedilol (COREG) 6.25 MG  tablet Take 1 tablet (6.25 mg total) by mouth 2 (two) times daily with a meal. 180 tablet 1  . diphenhydrAMINE (BENADRYL) 25 mg capsule OTC as directed.     . folic acid (FOLVITE) 1 MG tablet Take 2 mg by mouth daily.     Marland Kitchen ibuprofen (ADVIL,MOTRIN) 200 MG tablet Take 200 mg by mouth every 6 (six) hours as needed.    Marland Kitchen losartan (COZAAR) 50 MG tablet Take 1 tablet (50 mg total) by mouth daily. 30 tablet 11  . methocarbamol (ROBAXIN) 500 MG tablet TAKE 1 TABLET (500 MG TOTAL) BY MOUTH AT BEDTIME AS NEEDED. 30 tablet 1  . methotrexate (RHEUMATREX) 2.5 MG tablet Take 25 mg by mouth once a week. Caution:Chemotherapy. Protect from light.    . montelukast (SINGULAIR) 10 MG tablet Take 1 tablet (10 mg total) by mouth at bedtime. 90 tablet 3  . prednisoLONE acetate (PRED FORTE) 1 % ophthalmic suspension Place 1 drop into both eyes daily.     . traMADol (ULTRAM) 50 MG tablet TAKE 1 TABLET (50 MG TOTAL) BY MOUTH EVERY 8 (EIGHT) HOURS AS NEEDED FOR PAIN. 30 tablet 1   No current facility-administered medications on file prior to visit.    Review of Systems Review of Systems  Constitutional: Negative for fever, appetite change, and unexpected weight change.  Eyes: Negative for pain and visual disturbance.  Respiratory: Negative for cough and shortness of breath.   Cardiovascular: Negative for cp or palpitations    Gastrointestinal: Negative for nausea, diarrhea and constipation.  Genitourinary: Negative for urgency and frequency.  Skin: Negative for pallor or rash   MSK pos for baseline myofascial pain  Neurological: Negative for weakness, light-headedness, numbness and headaches.  Hematological: Negative for adenopathy. Does not bruise/bleed easily.  Psychiatric/Behavioral: Negative for dysphoric mood. The patient is not nervous/anxious.         Objective:   Physical Exam  Constitutional: She appears well-developed and well-nourished. No distress.  obese and well appearing   HENT:  Head:  Normocephalic and atraumatic.  Mouth/Throat: Oropharynx is clear and moist.  Eyes: Conjunctivae and EOM are normal. Pupils are equal, round, and reactive to light.  Neck: Normal range of motion. Neck supple. No JVD present. Carotid bruit is not present. No thyromegaly present.  Cardiovascular: Normal rate, regular rhythm, normal heart sounds and intact distal pulses.  Exam reveals no gallop.   Pulmonary/Chest: Effort normal and breath sounds normal. No respiratory distress. She has no wheezes. She has no rales.  No crackles  Abdominal: Soft. Bowel sounds are normal.  She exhibits no distension, no abdominal bruit and no mass. There is no tenderness.  Musculoskeletal: She exhibits no edema.  Lymphadenopathy:    She has no cervical adenopathy.  Neurological: She is alert. She has normal reflexes.  Skin: Skin is warm and dry. No rash noted.  Psychiatric: She has a normal mood and affect.          Assessment & Plan:   Problem List Items Addressed This Visit    Elevated TSH    Lab Results  Component Value Date   TSH 4.90* 07/18/2014   Nl T4 at .75 No symptoms Continue to follow       Essential hypertension - Primary    bp is up due to forgetting to take her medicine this am  Disc imp of med compliance  Lab rev  Enc good diet/exercise and wt loss        Hyperglycemia    Lab Results  Component Value Date   HGBA1C 6.1 07/18/2014   Improved  Disc low glycemic diet and wt loss to prevent DM Commended on quitting sugar drinks

## 2014-07-25 NOTE — Patient Instructions (Signed)
Take your blood pressure medicine as directed  Watch sugar and starches in diet - great job with the drinks so far   Thyroid - will continue to watch   Follow up in 6 months for annual exam with labs prior

## 2014-07-26 NOTE — Assessment & Plan Note (Signed)
Lab Results  Component Value Date   HGBA1C 6.1 07/18/2014   Improved  Disc low glycemic diet and wt loss to prevent DM Commended on quitting sugar drinks

## 2014-07-26 NOTE — Assessment & Plan Note (Addendum)
bp is up due to forgetting to take her medicine this am  Disc imp of med compliance  Lab rev  Enc good diet/exercise and wt loss

## 2014-07-26 NOTE — Assessment & Plan Note (Signed)
Lab Results  Component Value Date   TSH 4.90* 07/18/2014   Nl T4 at .75 No symptoms Continue to follow

## 2014-08-01 ENCOUNTER — Ambulatory Visit: Payer: Medicare HMO

## 2014-08-02 ENCOUNTER — Ambulatory Visit: Payer: Medicare HMO

## 2014-08-03 ENCOUNTER — Ambulatory Visit (INDEPENDENT_AMBULATORY_CARE_PROVIDER_SITE_OTHER): Payer: Medicare HMO

## 2014-08-03 DIAGNOSIS — J309 Allergic rhinitis, unspecified: Secondary | ICD-10-CM

## 2014-08-08 ENCOUNTER — Ambulatory Visit (INDEPENDENT_AMBULATORY_CARE_PROVIDER_SITE_OTHER): Payer: Medicare HMO

## 2014-08-08 DIAGNOSIS — J309 Allergic rhinitis, unspecified: Secondary | ICD-10-CM | POA: Diagnosis not present

## 2014-08-16 ENCOUNTER — Ambulatory Visit (INDEPENDENT_AMBULATORY_CARE_PROVIDER_SITE_OTHER): Payer: Medicare HMO

## 2014-08-16 DIAGNOSIS — J309 Allergic rhinitis, unspecified: Secondary | ICD-10-CM

## 2014-08-21 ENCOUNTER — Other Ambulatory Visit: Payer: Self-pay | Admitting: *Deleted

## 2014-08-21 MED ORDER — LOSARTAN POTASSIUM 50 MG PO TABS: 50.0000 mg | ORAL_TABLET | Freq: Every day | ORAL | Status: DC

## 2014-08-21 NOTE — Telephone Encounter (Signed)
Received a fax saying it's cheaper for pt to get 90 day supply of Losartan, per Dr. Marliss Coots note "can have 90 day, refill for a year" done

## 2014-08-22 ENCOUNTER — Ambulatory Visit: Payer: Medicare HMO

## 2014-08-28 ENCOUNTER — Ambulatory Visit (INDEPENDENT_AMBULATORY_CARE_PROVIDER_SITE_OTHER): Payer: Medicare HMO

## 2014-08-28 DIAGNOSIS — J309 Allergic rhinitis, unspecified: Secondary | ICD-10-CM

## 2014-08-30 ENCOUNTER — Encounter: Payer: Self-pay | Admitting: Internal Medicine

## 2014-09-04 ENCOUNTER — Ambulatory Visit (INDEPENDENT_AMBULATORY_CARE_PROVIDER_SITE_OTHER): Payer: Medicare HMO

## 2014-09-04 DIAGNOSIS — J309 Allergic rhinitis, unspecified: Secondary | ICD-10-CM | POA: Diagnosis not present

## 2014-09-12 ENCOUNTER — Ambulatory Visit (INDEPENDENT_AMBULATORY_CARE_PROVIDER_SITE_OTHER): Payer: Medicare HMO

## 2014-09-12 DIAGNOSIS — J309 Allergic rhinitis, unspecified: Secondary | ICD-10-CM

## 2014-09-17 ENCOUNTER — Ambulatory Visit (INDEPENDENT_AMBULATORY_CARE_PROVIDER_SITE_OTHER): Payer: Medicare HMO

## 2014-09-17 DIAGNOSIS — J309 Allergic rhinitis, unspecified: Secondary | ICD-10-CM | POA: Diagnosis not present

## 2014-09-18 ENCOUNTER — Telehealth: Payer: Self-pay | Admitting: Internal Medicine

## 2014-09-18 NOTE — Telephone Encounter (Signed)
Date Mixed: 09/17/2014 Vial: AB Strength: 1:50 Here/Mail/Pick Up: Here Mixed By: Desmond Dike, CMA

## 2014-09-19 ENCOUNTER — Ambulatory Visit (INDEPENDENT_AMBULATORY_CARE_PROVIDER_SITE_OTHER): Payer: Medicare HMO

## 2014-09-19 DIAGNOSIS — J309 Allergic rhinitis, unspecified: Secondary | ICD-10-CM

## 2014-09-26 ENCOUNTER — Ambulatory Visit (INDEPENDENT_AMBULATORY_CARE_PROVIDER_SITE_OTHER): Payer: Medicare HMO

## 2014-09-26 DIAGNOSIS — J309 Allergic rhinitis, unspecified: Secondary | ICD-10-CM

## 2014-10-03 ENCOUNTER — Ambulatory Visit (INDEPENDENT_AMBULATORY_CARE_PROVIDER_SITE_OTHER): Payer: Medicare HMO

## 2014-10-03 DIAGNOSIS — J309 Allergic rhinitis, unspecified: Secondary | ICD-10-CM

## 2014-10-10 ENCOUNTER — Ambulatory Visit (INDEPENDENT_AMBULATORY_CARE_PROVIDER_SITE_OTHER): Payer: Medicare HMO

## 2014-10-10 DIAGNOSIS — J309 Allergic rhinitis, unspecified: Secondary | ICD-10-CM

## 2014-10-17 ENCOUNTER — Ambulatory Visit (INDEPENDENT_AMBULATORY_CARE_PROVIDER_SITE_OTHER): Payer: Medicare HMO

## 2014-10-17 DIAGNOSIS — J309 Allergic rhinitis, unspecified: Secondary | ICD-10-CM | POA: Diagnosis not present

## 2014-10-24 ENCOUNTER — Ambulatory Visit (INDEPENDENT_AMBULATORY_CARE_PROVIDER_SITE_OTHER): Payer: Medicare HMO

## 2014-10-24 DIAGNOSIS — J309 Allergic rhinitis, unspecified: Secondary | ICD-10-CM | POA: Diagnosis not present

## 2014-11-06 ENCOUNTER — Ambulatory Visit (INDEPENDENT_AMBULATORY_CARE_PROVIDER_SITE_OTHER): Payer: Medicare HMO

## 2014-11-06 DIAGNOSIS — J309 Allergic rhinitis, unspecified: Secondary | ICD-10-CM

## 2014-11-14 ENCOUNTER — Ambulatory Visit (INDEPENDENT_AMBULATORY_CARE_PROVIDER_SITE_OTHER): Payer: Medicare HMO

## 2014-11-14 DIAGNOSIS — J309 Allergic rhinitis, unspecified: Secondary | ICD-10-CM

## 2014-11-21 ENCOUNTER — Ambulatory Visit (INDEPENDENT_AMBULATORY_CARE_PROVIDER_SITE_OTHER): Payer: Medicare HMO

## 2014-11-21 DIAGNOSIS — J309 Allergic rhinitis, unspecified: Secondary | ICD-10-CM

## 2014-11-23 ENCOUNTER — Telehealth: Payer: Self-pay | Admitting: Internal Medicine

## 2014-11-23 MED ORDER — PREDNISONE 10 MG PO TABS: ORAL_TABLET | ORAL | Status: DC

## 2014-11-23 NOTE — Telephone Encounter (Signed)
Offer prednisone 10 mg, # 20, 4 X 2 DAYS, 3 X 2 DAYS, 2 X 2 DAYS, 1 X 2 DAYS  

## 2014-11-23 NOTE — Telephone Encounter (Signed)
Patient notified of Dr. Janee Morn recommendations. Rx sent to pharmacy. Nothing further needed. Closing encounter

## 2014-11-23 NOTE — Telephone Encounter (Signed)
Patient says that her asthma has been worse for the last few days, even using the ProAir is not helping. Having a hard time getting her asthma under control. Patient has already had to use ProAir 3 times this morning.  Does not have nebulizer.  Patient says she has not had to use Advair in a few years, she is wondering if she needs to add it back.  She is taking Singulair now, taking allergy shots every week.   [facility] [place]  Allergies  Allergen Reactions  . Cetirizine Hcl     REACTION: not effective  . Erythromycin     REACTION: stomach hurts  . Escitalopram Oxalate     REACTION: no improvement  . Lisinopril     REACTION: cough  . Loratadine     REACTION: not effective  . Nifedipine     REACTION: fatigued and tremor  . Sertraline Hcl     REACTION: no help  . Vyvanse [Lisdexamfetamine Dimesylate]     Headache and excitablility   Current Outpatient Prescriptions on File Prior to Visit  Medication Sig Dispense Refill  . acetaminophen (TYLENOL) 500 MG tablet OTC as directed.     Marland Kitchen albuterol (PROAIR HFA) 108 (90 BASE) MCG/ACT inhaler INHALE 2 PUFFS EVERY 4 HOURS AS NEEDED FOR WHEEZING 1 Inhaler 11  . buPROPion (WELLBUTRIN XL) 300 MG 24 hr tablet TAKE ONE TABLET BY MOUTH ONCE DAILY 90 tablet 3  . carvedilol (COREG) 6.25 MG tablet Take 1 tablet (6.25 mg total) by mouth 2 (two) times daily with a meal. 180 tablet 1  . diphenhydrAMINE (BENADRYL) 25 mg capsule OTC as directed.     . folic acid (FOLVITE) 1 MG tablet Take 2 mg by mouth daily.     Marland Kitchen ibuprofen (ADVIL,MOTRIN) 200 MG tablet Take 200 mg by mouth every 6 (six) hours as needed.    Marland Kitchen losartan (COZAAR) 50 MG tablet Take 1 tablet (50 mg total) by mouth daily. 90 tablet 3  . methocarbamol (ROBAXIN) 500 MG tablet TAKE 1 TABLET (500 MG TOTAL) BY MOUTH AT BEDTIME AS NEEDED. 30 tablet 1  . methotrexate (RHEUMATREX) 2.5 MG tablet Take 25 mg by mouth once a week. Caution:Chemotherapy. Protect from light.    . montelukast (SINGULAIR) 10  MG tablet Take 1 tablet (10 mg total) by mouth at bedtime. 90 tablet 3  . NONFORMULARY OR COMPOUNDED ITEM Allergy Vaccine 1:50 Given at [facility]    . prednisoLONE acetate (PRED FORTE) 1 % ophthalmic suspension Place 1 drop into both eyes daily.     . traMADol (ULTRAM) 50 MG tablet TAKE 1 TABLET (50 MG TOTAL) BY MOUTH EVERY 8 (EIGHT) HOURS AS NEEDED FOR PAIN. 30 tablet 1   No current facility-administered medications on file prior to visit.

## 2014-11-26 ENCOUNTER — Ambulatory Visit: Payer: Medicare HMO

## 2014-11-27 ENCOUNTER — Ambulatory Visit (INDEPENDENT_AMBULATORY_CARE_PROVIDER_SITE_OTHER): Payer: Medicare HMO

## 2014-11-27 DIAGNOSIS — J309 Allergic rhinitis, unspecified: Secondary | ICD-10-CM

## 2014-12-05 ENCOUNTER — Encounter: Payer: Self-pay | Admitting: Family Medicine

## 2014-12-05 ENCOUNTER — Ambulatory Visit: Payer: Medicare HMO | Admitting: Family Medicine

## 2014-12-05 ENCOUNTER — Ambulatory Visit (INDEPENDENT_AMBULATORY_CARE_PROVIDER_SITE_OTHER): Payer: Medicare HMO | Admitting: Family Medicine

## 2014-12-05 VITALS — BP 108/76 | HR 87 | Temp 98.6°F | Ht 66.5 in | Wt 207.2 lb

## 2014-12-05 DIAGNOSIS — B370 Candidal stomatitis: Secondary | ICD-10-CM | POA: Diagnosis not present

## 2014-12-05 DIAGNOSIS — K120 Recurrent oral aphthae: Secondary | ICD-10-CM | POA: Diagnosis not present

## 2014-12-05 MED ORDER — LIDOCAINE HCL 2 % EX GEL: 1.0000 "application " | CUTANEOUS | Status: DC | PRN

## 2014-12-05 MED ORDER — NYSTATIN 100000 UNIT/ML MT SUSP: OROMUCOSAL | Status: DC

## 2014-12-05 NOTE — Patient Instructions (Signed)
I think you have some thrush on your tongue and some aphthous ulcers in mouth  Use nystatin mouthwash as directed  Lidocaine gel to ulcers as needed

## 2014-12-05 NOTE — Progress Notes (Signed)
Subjective:    Patient ID: [name], female    DOB: Nov 02, 1965, 49 y.o.   MRN: [id number]  HPI Here for spots in her mouth  Across palate - roof of mouth towards the back  Few on tongue  Hurts to swallow   No spots on hands or feet  No exp to hand/foot/mouth   Is on methotrexate- she often gets canker sores on her tongue  This is worse   Was also on prednisone recently for asthma -getting better   On folic acid   Patient Active Problem List   Diagnosis Date Noted  . Right foot pain 04/16/2014  . Acute sinusitis 03/07/2014  . Viral URI with cough 01/24/2014  . Elevated TSH 01/24/2014  . Knee pain, right 10/01/2013  . Aphthous ulcer of tongue 03/09/2013  . Papule 03/02/2013  . Paronychia 02/10/2013  . Nasal septal deviation 01/03/2013  . Encounter for Medicare annual wellness exam 11/02/2012  . Hydradenitis 11/02/2012  . Hyperglycemia 03/23/2012  . Macular hole 03/23/2012  . Screening-pulmonary TB 03/23/2012  . Routine general medical examination at a health care facility 10/02/2011  . Iritis, recurrent 10/02/2011  . Tachycardia 10/02/2011  . Other screening mammogram 10/02/2011  . ADD (attention deficit disorder) 08/20/2010  . DYSHIDROTIC ECZEMA, HANDS 11/12/2009  . Obstructive sleep apnea 11/19/2006  . DEPRESSION 09/29/2006  . Essential hypertension 09/29/2006  . Seasonal and perennial allergic rhinitis 09/29/2006  . Asthma, mild intermittent, well-controlled 09/29/2006  . GERD 09/29/2006  . IBS 09/29/2006  . ENDOMETRIOSIS 09/29/2006  . FEMALE INFERTILITY 09/29/2006  . FIBROMYALGIA 09/29/2006  . INSOMNIA 09/29/2006   Past Medical History  Diagnosis Date  . Allergy     allergic rhinitis  . Asthma   . Depression   . GERD (gastroesophageal reflux disease)   . Hypertension   . Fibromyalgia   . Degenerative disc disease   . Sleep apnea   . Fx of fibula 2009  . IBS (irritable bowel syndrome)   . Chronic headaches   . Hemorrhoids   . Iritis,  chronic   . Macular hole of right eye    Past Surgical History  Procedure Laterality Date  . Laparoscopy  08/2004    endometriosis - several   . Breast surgery      left nipple removed - benign tumor  . Anterior cruciate ligament repair  1996    small tear of ACL  . Bunionectomy    . Tonsillectomy    . Abdominal hysterectomy     Social History  Substance Use Topics  . Smoking status: Never Smoker   . Smokeless tobacco: Never Used  . Alcohol Use: No   Family History  Problem Relation Age of Onset  . Hypertension Mother   . Thyroid disease Mother   . Fibromyalgia Mother   . Diabetes Father   . Kidney cancer Maternal Grandmother   . Colon cancer Neg Hx   . Colon polyps Mother    Allergies  Allergen Reactions  . Cetirizine Hcl     REACTION: not effective  . Erythromycin     REACTION: stomach hurts  . Escitalopram Oxalate     REACTION: no improvement  . Lisinopril     REACTION: cough  . Loratadine     REACTION: not effective  . Nifedipine     REACTION: fatigued and tremor  . Sertraline Hcl     REACTION: no help  . Vyvanse [Lisdexamfetamine Dimesylate]     Headache and excitablility  Current Outpatient Prescriptions on File Prior to Visit  Medication Sig Dispense Refill  . acetaminophen (TYLENOL) 500 MG tablet OTC as directed.     Marland Kitchen albuterol (PROAIR HFA) 108 (90 BASE) MCG/ACT inhaler INHALE 2 PUFFS EVERY 4 HOURS AS NEEDED FOR WHEEZING 1 Inhaler 11  . buPROPion (WELLBUTRIN XL) 300 MG 24 hr tablet TAKE ONE TABLET BY MOUTH ONCE DAILY 90 tablet 3  . carvedilol (COREG) 6.25 MG tablet Take 1 tablet (6.25 mg total) by mouth 2 (two) times daily with a meal. 180 tablet 1  . diphenhydrAMINE (BENADRYL) 25 mg capsule OTC as directed.     . folic acid (FOLVITE) 1 MG tablet Take 2 mg by mouth daily.     Marland Kitchen ibuprofen (ADVIL,MOTRIN) 200 MG tablet Take 200 mg by mouth every 6 (six) hours as needed.    Marland Kitchen losartan (COZAAR) 50 MG tablet Take 1 tablet (50 mg total) by mouth daily. 90  tablet 3  . methocarbamol (ROBAXIN) 500 MG tablet TAKE 1 TABLET (500 MG TOTAL) BY MOUTH AT BEDTIME AS NEEDED. 30 tablet 1  . methotrexate (RHEUMATREX) 2.5 MG tablet Take 25 mg by mouth once a week. Caution:Chemotherapy. Protect from light.    . montelukast (SINGULAIR) 10 MG tablet Take 1 tablet (10 mg total) by mouth at bedtime. 90 tablet 3  . NONFORMULARY OR COMPOUNDED ITEM Allergy Vaccine 1:50 Given at [facility]    . prednisoLONE acetate (PRED FORTE) 1 % ophthalmic suspension Place 1 drop into both eyes daily.     . traMADol (ULTRAM) 50 MG tablet TAKE 1 TABLET (50 MG TOTAL) BY MOUTH EVERY 8 (EIGHT) HOURS AS NEEDED FOR PAIN. 30 tablet 1   No current facility-administered medications on file prior to visit.      Review of Systems Review of Systems  Constitutional: Negative for fever, appetite change, fatigue and unexpected weight change.  Eyes: Negative for pain and visual disturbance.  ENT neg for ST or trouble swallowing  Respiratory: Negative for cough and shortness of breath.   Cardiovascular: Negative for cp or palpitations    Gastrointestinal: Negative for nausea, diarrhea and constipation.  Genitourinary: Negative for urgency and frequency.  Skin: Negative for pallor or rash   Neurological: Negative for weakness, light-headedness, numbness and headaches.  Hematological: Negative for adenopathy. Does not bruise/bleed easily.  Psychiatric/Behavioral: Negative for dysphoric mood. The patient is not nervous/anxious.         Objective:   Physical Exam  Constitutional: She appears well-developed and well-nourished. No distress.  obese and well appearing   HENT:  Head: Normocephalic and atraumatic.  Right Ear: External ear normal.  Left Ear: External ear normal.  Nose: Nose normal.  Mouth/Throat: No oropharyngeal exudate.  Mild thrush on post tongue (non scrapable white coating)  Several aphthous ulcers on posterior hard and soft palate and one on tongue     Eyes:  Conjunctivae and EOM are normal. Pupils are equal, round, and reactive to light. Right eye exhibits no discharge. Left eye exhibits no discharge. No scleral icterus.  Neck: Normal range of motion. Neck supple.  Cardiovascular: Normal rate and regular rhythm.   Pulmonary/Chest: Effort normal and breath sounds normal. No respiratory distress. She has no wheezes. She has no rales.  Musculoskeletal: She exhibits no edema.  Lymphadenopathy:    She has no cervical adenopathy.  Neurological: She is alert. She has normal reflexes.  Skin: Skin is warm and dry. No rash noted. No erythema. No pallor.  No lesions or hands  or feet   Psychiatric: She has a normal mood and affect.          Assessment & Plan:   Problem List Items Addressed This Visit      Digestive   Aphthous ulcer of mouth    These have been recurrent in the past  Has been on MTX and also prednisone Also some thrush  tx with lidocaine prn to ulcers  Avoid acidic /spicy foods No features of hand/foot/mouth dz at this time       Thrush - Primary    tx with nystatin  Pt was recently on prednisone Update if not starting to improve in a week or if worsening        Relevant Medications   nystatin (MYCOSTATIN) 100000 UNIT/ML suspension

## 2014-12-05 NOTE — Progress Notes (Signed)
Pre visit review using our clinic review tool, if applicable. No additional management support is needed unless otherwise documented below in the visit note. 

## 2014-12-06 ENCOUNTER — Ambulatory Visit (INDEPENDENT_AMBULATORY_CARE_PROVIDER_SITE_OTHER): Payer: Medicare HMO

## 2014-12-06 ENCOUNTER — Telehealth: Payer: Self-pay | Admitting: *Deleted

## 2014-12-06 DIAGNOSIS — J309 Allergic rhinitis, unspecified: Secondary | ICD-10-CM | POA: Diagnosis not present

## 2014-12-06 MED ORDER — LIDOCAINE VISCOUS 2 % MT SOLN: OROMUCOSAL | Status: DC

## 2014-12-06 NOTE — Telephone Encounter (Signed)
Received fax from pharmacy saying "Please verify you want external lidocaine jelly to be put in pt's mouth. There is a viscons lidocaine 2% solution that can be used in the mouth. Please clarify Thanks"  Please advise

## 2014-12-06 NOTE — Telephone Encounter (Signed)
Rx changed and pharmacy advise and advise to let pt know it's the same directions

## 2014-12-06 NOTE — Telephone Encounter (Signed)
Please change to viscous lidocaine and let pt know - she can still dip a q tip in it and use on spots  Thanks

## 2014-12-09 NOTE — Assessment & Plan Note (Signed)
These have been recurrent in the past  Has been on MTX and also prednisone Also some thrush  tx with lidocaine prn to ulcers  Avoid acidic /spicy foods No features of hand/foot/mouth dz at this time

## 2014-12-09 NOTE — Assessment & Plan Note (Signed)
tx with nystatin  Pt was recently on prednisone Update if not starting to improve in a week or if worsening

## 2014-12-14 ENCOUNTER — Encounter: Payer: Self-pay | Admitting: Internal Medicine

## 2014-12-14 ENCOUNTER — Ambulatory Visit (INDEPENDENT_AMBULATORY_CARE_PROVIDER_SITE_OTHER): Payer: Medicare HMO | Admitting: Internal Medicine

## 2014-12-14 ENCOUNTER — Ambulatory Visit (INDEPENDENT_AMBULATORY_CARE_PROVIDER_SITE_OTHER): Payer: Medicare HMO

## 2014-12-14 VITALS — BP 110/76 | HR 83 | Ht 68.0 in | Wt 208.0 lb

## 2014-12-14 DIAGNOSIS — J309 Allergic rhinitis, unspecified: Secondary | ICD-10-CM

## 2014-12-14 DIAGNOSIS — G4733 Obstructive sleep apnea (adult) (pediatric): Secondary | ICD-10-CM

## 2014-12-14 DIAGNOSIS — H2013 Chronic iridocyclitis, bilateral: Secondary | ICD-10-CM

## 2014-12-14 DIAGNOSIS — J302 Other seasonal allergic rhinitis: Secondary | ICD-10-CM

## 2014-12-14 DIAGNOSIS — J3089 Other allergic rhinitis: Secondary | ICD-10-CM

## 2014-12-14 DIAGNOSIS — H20021 Recurrent acute iridocyclitis, right eye: Secondary | ICD-10-CM

## 2014-12-14 DIAGNOSIS — J4520 Mild intermittent asthma, uncomplicated: Secondary | ICD-10-CM

## 2014-12-14 MED ORDER — MONTELUKAST SODIUM 10 MG PO TABS: 10.0000 mg | ORAL_TABLET | Freq: Every day | ORAL | Status: DC

## 2014-12-14 MED ORDER — FLUTICASONE-SALMETEROL 100-50 MCG/DOSE IN AEPB: INHALATION_SPRAY | RESPIRATORY_TRACT | Status: DC

## 2014-12-14 NOTE — Progress Notes (Signed)
Patient ID: [name], female    DOB: 02-24-1965, 49 y.o.   MRN: [id number]  HPI 27 yoF followed for obstructive sleep apnea and allergic rhinitis, complicated by insomnia, GERD, depression. Last here March 9 when we gave depo shot which made her much better. She was able to go back to full time use of her CPAP machine - 11 cwp Advanced. We discussed her meds. She feels well today.   11/06/10- 59 yoF followed for obstructive sleep apnea and allergic rhinitis, complicated by insomnia, GERD, depression. Has had flu vaccine. Had sustained head congestion and productive cough one month ago. Her primary physician treated Augmentin and cough syrup. She was clear after that until 4 days ago. One day after her flu shot, she woke with scratchy throat, no fever, increased head congestion.Unable to use CPAP while her head is congested.her ophthalmologist has asked her to get allergy testing done.  Years ago, allergy testing was positive and she was on allergy vaccine for a while. She does not remember details or response.  01/07/11-  33 yoF followed for obstructive sleep apnea and allergic rhinitis, complicated by insomnia, GERD, depression. Comes today for allergy skin testing off antihistamines. No recent respiratory infections. Wheeze very occasionally with exertion.  Anticipates seasonal flare as February pollens start. Had flu vaccine. Uses CPAP regularly "as long as I can breathe". Allergy Skin tests- significant positive reactions for numerous, and inhalant allergens, particularly grass weed and tree pollens, dust mite, cat and some molds. She wishes to start allergy vaccine.  03/12/11-  46 yoF followed for obstructive sleep apnea and allergic rhinitis, complicated by insomnia, GERD, depression. Wearing CPAP less consistently in the last 2 weeks because of nasal congestion. Building allergy vaccine without problems. Taking Allegra-D plus Sudafed. We discussed this. Using saline nasal rinse. Continues  Advair 250. Using rescue inhaler about twice a week.  07/13/11- 46 yoF followed for obstructive sleep apnea and allergic rhinitis, complicated by insomnia, GERD, depression. Having reactions in left arm only. We are holding her allergy vaccine at 1:50/0.4 mL. CPAP 11/Advanced-working very well for her. She describes good compliance and control with no problems.  10/13/11- 73 yoF followed for obstructive sleep apnea and allergic rhinitis, complicated by insomnia, GERD, depression. On vaccine(no reaction for about 3 wks and then started today)itchy red area.  Has had flu vaccine. Holding at 1:50, 0.3 mL's. She does say she considers herself better off on allergy vaccine. Her new eye doctor asked her to see me with question of whether allergic sinus problems could be related to 3 episodes of iritis. She is now on steroid eyedrops. She continues daily Zyrtec and Singulair with Benadryl at night.  02/15/12-46 yoF followed for obstructive sleep apnea and allergic rhinitis, complicated by insomnia, GERD, depression. FOLLOWS FOR: on allergy vaccine 1:50 0.3 mL GH and still having slight reactions; got shot today and slight redness around injection site. Minor local reaction occasionally to her allergy shots. She mentions these when asked but says they don't bother her at all. Still occasional episode of iritis, one eye or the other, with no pattern. Continues doing well with CPAP 11/Advanced used all night every night. CXR 11/02/11 IMPRESSION:  Negative  Original Report Authenticated By: Truett Perna, M.D.  06/13/12- 46 yoF followed for obstructive sleep apnea and allergic rhinitis, complicated by insomnia, GERD, depression. On allergy on vaccine.  No problems at this time.   Wearing cpap11/Advanced everynight for approx 6-8 hours.  No problems with mask  or pressure. Had local reaction after reaching vaccine concentration 1:10 GH, so back down to 1:50 for now. We discussed risk benefit and management of  reactions. Check replacement for CPAP machine which has developed an odor. She also needs a chin strap because her mouth dries out when wearing nasal CPAP.  12/12/12- 34 yoF followed for obstructive sleep apnea and allergic rhinitis, complicated by insomnia, GERD, depression. FOLLOWS FOR:  Wearing CPAP 11/ Advanced 6-8 hours per night.   Still doing well on Allergy vaccine 1:50 [place] She likes her new CPAP machine with nasal pillows mask  12/12/13- 28 yoF followed for obstructive sleep apnea and allergic rhinitis, complicated by insomnia, GERD, depression, iritis. FOLLOWS FOR: wears cpap 11/ [employer] 6-8 hours nightly, no complaints with pressure/supplies.  Tolerating allergy vaccines 1:50 GH well.   06/14/14- 37 yoF followed for obstructive sleep apnea and allergic rhinitis, asthma, complicated by insomnia, GERD, depression. Follows For: Pt states she wears CPAP 11 Advanced 6-8 hours nightly and states mask fits fine. Tolerating Allergy vaccine 1:50 GH well. Needs to stay at 0.4 mL/vial to avoid local reaction. Rare need for ProAir her rescue inhaler. Asthma does not wake her.  [facility] treating with methotrexate tablets for chronic iritis.  12/14/14- 69 yoF followed for obstructive sleep apnea and allergic rhinitis, asthma, complicated by insomnia, GERD, depression. CPAP 11/ Advanced Allergy vaccine 1:50 GH FOLLOWS FOR: Currently wearing CPAP every night 6-7 horus per night. Denies problems with machine, mask or pressure. Still feels like allergy vaccine is working well for her. Prednisone taper for asthma several weeks ago resolved the problem. Now using rescue inhaler 0-2 times daily. Has not been on maintenance inhaler for a long time but may need it during winter weather. On methotrexate for chronic iritis of unknown etiology. Up-to-date on flu shot. CPAP download confirms excellent compliance and control at 11 CWP/Advanced  Review of Systems- see HPI Constitutional:   No-   weight loss,  night sweats, fevers, chills, fatigue, lassitude. HEENT:   No-  headaches, difficulty swallowing, tooth/dental problems, sore throat,       No- sneezing, itching, ear ache, +nasal congestion, little post nasal drip,  CV:  No-   chest pain, orthopnea, PND, swelling in lower extremities, anasarca, dizziness, palpitations Resp: No-   shortness of breath with exertion or at rest.              No-   productive cough, occasional non-productive cough,  No- coughing up of blood.              No-   change in color of mucus.  No- wheezing.   Skin: No-   rash or lesions. GI:  No-   heartburn, indigestion, abdominal pain, nausea, vomiting,  GU: MS:   Neuro-     nothing unusual Psych:  No- change in mood or affect. No depression or anxiety.  No memory loss.  Objective:   Physical Exam General- Alert, Oriented, Affect-appropriate, Distress- none acute Skin- +keratosis pilaris Lymphadenopathy- none Head- atraumatic            Eyes- Gross vision intact, PERRLA, conjunctivae clear secretions            Ears- Hearing, canals-normal            Nose- mucosa + clear, turbinate edema, +Septal dev,no- polyps, erosion, perforation             Throat- Mallampati II-III , mucosa clear , drainage- none, tonsils- atrophic Neck- flexible ,  trachea midline, no stridor , thyroid nl, carotid no bruit Chest - symmetrical excursion , unlabored           Heart/CV- RRR , no murmur , no gallop  , no rub, nl s1 s2                           - JVD- none , edema- none, stasis changes- none, varices- none           Lung- clear to P&A, wheeze- none, cough- none  , dullness-none, rub- none           Chest wall-  Abd-  Br/ Gen/ Rectal- Not done, not indicated Extrem- cyanosis- none, clubbing, none, atrophy- none, strength- nl Neuro- grossly intact to observation

## 2014-12-14 NOTE — Assessment & Plan Note (Signed)
Download confirms good compliance and control. Okay to continue without change.

## 2014-12-14 NOTE — Assessment & Plan Note (Signed)
She did well through the summer without a maintenance inhaler using rescue inhaler only occasionally. With weather changes now we are refilling Singulair, Advair 100 with discussion

## 2014-12-14 NOTE — Patient Instructions (Signed)
We can continue allergy vaccine at 1:50 [facility]  We can continue CPAP 11/ Advanced  Scripts sent refilling Advair and singulair  Please call if we can help

## 2014-12-14 NOTE — Assessment & Plan Note (Signed)
Idiopathic. She is being managed with systemic methotrexate.

## 2014-12-14 NOTE — Assessment & Plan Note (Signed)
We can continue allergy vaccine another year

## 2014-12-18 ENCOUNTER — Ambulatory Visit (INDEPENDENT_AMBULATORY_CARE_PROVIDER_SITE_OTHER): Payer: Medicare HMO

## 2014-12-18 DIAGNOSIS — J309 Allergic rhinitis, unspecified: Secondary | ICD-10-CM

## 2014-12-25 ENCOUNTER — Ambulatory Visit (INDEPENDENT_AMBULATORY_CARE_PROVIDER_SITE_OTHER): Payer: Medicare HMO

## 2014-12-25 ENCOUNTER — Encounter: Payer: Self-pay | Admitting: Internal Medicine

## 2014-12-25 DIAGNOSIS — J309 Allergic rhinitis, unspecified: Secondary | ICD-10-CM

## 2015-01-02 ENCOUNTER — Ambulatory Visit (INDEPENDENT_AMBULATORY_CARE_PROVIDER_SITE_OTHER): Payer: Medicare HMO

## 2015-01-02 DIAGNOSIS — J309 Allergic rhinitis, unspecified: Secondary | ICD-10-CM

## 2015-01-04 DIAGNOSIS — G4733 Obstructive sleep apnea (adult) (pediatric): Secondary | ICD-10-CM | POA: Diagnosis not present

## 2015-01-08 ENCOUNTER — Encounter: Payer: Self-pay | Admitting: Family Medicine

## 2015-01-08 ENCOUNTER — Ambulatory Visit
Admission: RE | Admit: 2015-01-08 | Discharge: 2015-01-08 | Disposition: A | Discharge status: Home / Self Care | Payer: Medicare HMO | Source: Ambulatory Visit | Attending: Family Medicine | Admitting: Family Medicine

## 2015-01-08 ENCOUNTER — Other Ambulatory Visit: Payer: Medicare HMO

## 2015-01-08 ENCOUNTER — Ambulatory Visit (INDEPENDENT_AMBULATORY_CARE_PROVIDER_SITE_OTHER): Payer: Medicare HMO | Admitting: Family Medicine

## 2015-01-08 VITALS — BP 128/80 | HR 76 | Temp 98.7°F | Ht 68.0 in | Wt 206.5 lb

## 2015-01-08 DIAGNOSIS — K219 Gastro-esophageal reflux disease without esophagitis: Secondary | ICD-10-CM | POA: Diagnosis not present

## 2015-01-08 DIAGNOSIS — R1011 Right upper quadrant pain: Secondary | ICD-10-CM

## 2015-01-08 LAB — COMPREHENSIVE METABOLIC PANEL
ALBUMIN: 4 g/dL (ref 3.5–5.2)
ALT: 18 U/L (ref 0–35)
AST: 37 U/L (ref 0–37)
Alkaline Phosphatase: 82 U/L (ref 39–117)
BUN: 10 mg/dL (ref 6–23)
CHLORIDE: 102 meq/L (ref 96–112)
CO2: 29 meq/L (ref 19–32)
CREATININE: 0.83 mg/dL (ref 0.40–1.20)
Calcium: 9.6 mg/dL (ref 8.4–10.5)
GFR: 78.13 mL/min (ref >60.00)
GLUCOSE: 94 mg/dL (ref 70–99)
Potassium: 4.3 mEq/L (ref 3.5–5.1)
SODIUM: 139 meq/L (ref 135–145)
Total Bilirubin: 0.6 mg/dL (ref 0.2–1.2)
Total Protein: 7.1 g/dL (ref 6.0–8.3)

## 2015-01-08 LAB — AMYLASE: AMYLASE: 38 U/L (ref 27–131)

## 2015-01-08 LAB — LIPASE: LIPASE: 16 U/L (ref 7–60)

## 2015-01-08 LAB — CBC WITH DIFFERENTIAL/PLATELET
BASOS PCT: 0.7 % (ref 0.0–3.0)
Basophils Absolute: 0 10*3/uL (ref 0.0–0.1)
EOS ABS: 0.2 10*3/uL (ref 0.0–0.7)
Eosinophils Relative: 3.9 % (ref 0.0–5.0)
HCT: 44 % (ref 36.0–46.0)
HEMOGLOBIN: 14.3 g/dL (ref 12.0–15.0)
Lymphocytes Relative: 33.8 % (ref 12.0–46.0)
Lymphs Abs: 2.1 10*3/uL (ref 0.7–4.0)
MCHC: 32.6 g/dL (ref 30.0–36.0)
MCV: 90.3 fl (ref 78.0–100.0)
MONO ABS: 0.4 10*3/uL (ref 0.1–1.0)
Monocytes Relative: 7.1 % (ref 3.0–12.0)
NEUTROS PCT: 54.5 % (ref 43.0–77.0)
Neutro Abs: 3.3 10*3/uL (ref 1.4–7.7)
Platelets: 270 10*3/uL (ref 150.0–400.0)
RBC: 4.88 Mil/uL (ref 3.87–5.11)
RDW: 15.8 % — AB (ref 11.5–15.5)
WBC: 6.1 10*3/uL (ref 4.0–10.5)

## 2015-01-08 MED ORDER — ESOMEPRAZOLE MAGNESIUM 20 MG PO CPDR: 20.0000 mg | DELAYED_RELEASE_CAPSULE | Freq: Every day | ORAL | Status: DC

## 2015-01-08 NOTE — Patient Instructions (Addendum)
Eat a low fat diet  Stop at check out for referral for ultrasound of abdomen Start back on nexium  If severe abdominal pain or fever or vomiting go to the ER

## 2015-01-08 NOTE — Assessment & Plan Note (Signed)
In a pt with known GERD-not well controlled off her nexium-will get her started back on this Lab today for cmet/cbc/lipase/amylase Ref for abd [place] to r/o gallbladder cause - she is at risk for this  Will udpate if worse-go to ER if necessary

## 2015-01-08 NOTE — Progress Notes (Signed)
Pre visit review using our clinic review tool, if applicable. No additional management support is needed unless otherwise documented below in the visit note. 

## 2015-01-08 NOTE — Assessment & Plan Note (Signed)
Not doing well off nexium  Will px this at 40 mg -if not covered will get otc  Disc diet for gerd

## 2015-01-08 NOTE — Progress Notes (Signed)
Subjective:    Patient ID: [name], female    DOB: 09-27-1965, 50 y.o.   MRN: [id number]  HPI  Here for abd pain   RUQ pain  Tried gas x No help   Still has her gallbladder   Dull unless she moves a certain way- then is a sharp pain  Last night- it was constant  Today improved-not gone - comes and goes   No burping or belching or gas or nausea  No change in stools  No fever   Last ate peanuts ? If more fatty foods during the holidays-not a lot   Has frequent heartburn-needs to get back on nexium   Patient Active Problem List   Diagnosis Date Noted  . Thrush 12/05/2014  . Aphthous ulcer of mouth 12/05/2014  . Right foot pain 04/16/2014  . Acute sinusitis 03/07/2014  . Viral URI with cough 01/24/2014  . Elevated TSH 01/24/2014  . Knee pain, right 10/01/2013  . Aphthous ulcer of tongue 03/09/2013  . Papule 03/02/2013  . Paronychia 02/10/2013  . Nasal septal deviation 01/03/2013  . Encounter for Medicare annual wellness exam 11/02/2012  . Hydradenitis 11/02/2012  . Hyperglycemia 03/23/2012  . Macular hole 03/23/2012  . Screening-pulmonary TB 03/23/2012  . Routine general medical examination at a health care facility 10/02/2011  . Iritis, recurrent 10/02/2011  . Tachycardia 10/02/2011  . Other screening mammogram 10/02/2011  . ADD (attention deficit disorder) 08/20/2010  . DYSHIDROTIC ECZEMA, HANDS 11/12/2009  . Obstructive sleep apnea 11/19/2006  . DEPRESSION 09/29/2006  . Essential hypertension 09/29/2006  . Seasonal and perennial allergic rhinitis 09/29/2006  . Asthma, mild intermittent, well-controlled 09/29/2006  . GERD 09/29/2006  . IBS 09/29/2006  . ENDOMETRIOSIS 09/29/2006  . FEMALE INFERTILITY 09/29/2006  . FIBROMYALGIA 09/29/2006  . INSOMNIA 09/29/2006   Past Medical History  Diagnosis Date  . Allergy     allergic rhinitis  . Asthma   . Depression   . GERD (gastroesophageal reflux disease)   . Hypertension   . Fibromyalgia   .  Degenerative disc disease   . Sleep apnea   . Fx of fibula 2009  . IBS (irritable bowel syndrome)   . Chronic headaches   . Hemorrhoids   . Iritis, chronic   . Macular hole of right eye    Past Surgical History  Procedure Laterality Date  . Laparoscopy  08/2004    endometriosis - several   . Breast surgery      left nipple removed - benign tumor  . Anterior cruciate ligament repair  1996    small tear of ACL  . Bunionectomy    . Tonsillectomy    . Abdominal hysterectomy     Social History  Substance Use Topics  . Smoking status: Never Smoker   . Smokeless tobacco: Never Used  . Alcohol Use: No   Family History  Problem Relation Age of Onset  . Hypertension Mother   . Thyroid disease Mother   . Fibromyalgia Mother   . Diabetes Father   . Kidney cancer Maternal Grandmother   . Colon cancer Neg Hx   . Colon polyps Mother    Allergies  Allergen Reactions  . Cetirizine Hcl     REACTION: not effective  . Erythromycin     REACTION: stomach hurts  . Escitalopram Oxalate     REACTION: no improvement  . Lisinopril     REACTION: cough  . Loratadine     REACTION: not effective  .  Nifedipine     REACTION: fatigued and tremor  . Sertraline Hcl     REACTION: no help  . Vyvanse [Lisdexamfetamine Dimesylate]     Headache and excitablility   Current Outpatient Prescriptions on File Prior to Visit  Medication Sig Dispense Refill  . acetaminophen (TYLENOL) 500 MG tablet OTC as directed.     Marland Kitchen albuterol (PROAIR HFA) 108 (90 BASE) MCG/ACT inhaler INHALE 2 PUFFS EVERY 4 HOURS AS NEEDED FOR WHEEZING 1 Inhaler 11  . buPROPion (WELLBUTRIN XL) 300 MG 24 hr tablet TAKE ONE TABLET BY MOUTH ONCE DAILY 90 tablet 3  . carvedilol (COREG) 6.25 MG tablet Take 1 tablet (6.25 mg total) by mouth 2 (two) times daily with a meal. 180 tablet 1  . diphenhydrAMINE (BENADRYL) 25 mg capsule OTC as directed.     . Fluticasone-Salmeterol (ADVAIR DISKUS) 100-50 MCG/DOSE AEPB Inhale 1 puff then rinse  mouth, twice daily maintenance 60 each prn  . folic acid (FOLVITE) 1 MG tablet Take 2 mg by mouth daily.     Marland Kitchen ibuprofen (ADVIL,MOTRIN) 200 MG tablet Take 200 mg by mouth every 6 (six) hours as needed.    . lidocaine (XYLOCAINE) 2 % solution Apply 1 application topically every 2 (two) hours as needed. To sores in mouth - Topical 20 mL 2  . losartan (COZAAR) 50 MG tablet Take 1 tablet (50 mg total) by mouth daily. 90 tablet 3  . methocarbamol (ROBAXIN) 500 MG tablet TAKE 1 TABLET (500 MG TOTAL) BY MOUTH AT BEDTIME AS NEEDED. 30 tablet 1  . methotrexate (RHEUMATREX) 2.5 MG tablet Take 25 mg by mouth once a week. Caution:Chemotherapy. Protect from light.    . montelukast (SINGULAIR) 10 MG tablet Take 1 tablet (10 mg total) by mouth at bedtime. 90 tablet 3  . NONFORMULARY OR COMPOUNDED ITEM Allergy Vaccine 1:50 Given at [facility]    . prednisoLONE acetate (PRED FORTE) 1 % ophthalmic suspension Place 1 drop into both eyes daily.     . traMADol (ULTRAM) 50 MG tablet TAKE 1 TABLET (50 MG TOTAL) BY MOUTH EVERY 8 (EIGHT) HOURS AS NEEDED FOR PAIN. 30 tablet 1   No current facility-administered medications on file prior to visit.      Review of Systems Review of Systems  Constitutional: Negative for fever, appetite change, fatigue and unexpected weight change.  Eyes: Negative for pain and visual disturbance.  Respiratory: Negative for cough and shortness of breath.   Cardiovascular: Negative for cp or palpitations    Gastrointestinal: Negative for nausea, diarrhea and constipation. pos for abdominal pain , neg for blood in stool or black stool Genitourinary: Negative for urgency and frequency.  Skin: Negative for pallor or rash   Neurological: Negative for weakness, light-headedness, numbness and headaches.  Hematological: Negative for adenopathy. Does not bruise/bleed easily.  Psychiatric/Behavioral: Negative for dysphoric mood. The patient is not nervous/anxious.         Objective:    Physical Exam  Constitutional: She appears well-developed and well-nourished. No distress.  obese and well appearing   HENT:  Head: Normocephalic and atraumatic.  Mouth/Throat: Oropharynx is clear and moist.  Eyes: Conjunctivae and EOM are normal. Pupils are equal, round, and reactive to light. No scleral icterus.  Neck: Normal range of motion. Neck supple.  Cardiovascular: Normal rate, regular rhythm and normal heart sounds.   Pulmonary/Chest: Effort normal and breath sounds normal. No respiratory distress. She has no wheezes. She has no rales.  Abdominal: Soft. Bowel sounds are normal. She  exhibits no distension, no pulsatile liver, no abdominal bruit, no ascites and no mass. There is no hepatosplenomegaly. There is tenderness in the right upper quadrant and epigastric area. There is positive Murphy's sign. There is no rigidity, no rebound, no guarding, no CVA tenderness and no tenderness at McBurney's point.  Lymphadenopathy:    She has no cervical adenopathy.  Neurological: She is alert.  Skin: Skin is warm and dry. No erythema. No pallor.  Psychiatric: She has a normal mood and affect.          Assessment & Plan:   Problem List Items Addressed This Visit      Digestive   GERD    Not doing well off nexium  Will px this at 40 mg -if not covered will get otc  Disc diet for gerd        Relevant Medications   esomeprazole (NEXIUM) 20 MG capsule     Other   Abdominal pain, right upper quadrant - Primary    In a pt with known GERD-not well controlled off her nexium-will get her started back on this Lab today for cmet/cbc/lipase/amylase Ref for abd [place] to r/o gallbladder cause - she is at risk for this  Will udpate if worse-go to ER if necessary       Relevant Orders   CBC with Differential/Platelet (Completed)   Comprehensive metabolic panel (Completed)   Amylase (Completed)   Lipase   US Abdomen Limited RUQ (Completed)

## 2015-01-09 ENCOUNTER — Ambulatory Visit (INDEPENDENT_AMBULATORY_CARE_PROVIDER_SITE_OTHER): Payer: Medicare HMO

## 2015-01-09 DIAGNOSIS — J309 Allergic rhinitis, unspecified: Secondary | ICD-10-CM | POA: Diagnosis not present

## 2015-01-18 DIAGNOSIS — H35353 Cystoid macular degeneration, bilateral: Secondary | ICD-10-CM | POA: Diagnosis not present

## 2015-01-18 DIAGNOSIS — H20029 Recurrent acute iridocyclitis, unspecified eye: Secondary | ICD-10-CM | POA: Diagnosis not present

## 2015-01-18 DIAGNOSIS — Z79899 Other long term (current) drug therapy: Secondary | ICD-10-CM | POA: Diagnosis not present

## 2015-01-21 ENCOUNTER — Other Ambulatory Visit: Payer: Medicare HMO

## 2015-01-23 ENCOUNTER — Ambulatory Visit (INDEPENDENT_AMBULATORY_CARE_PROVIDER_SITE_OTHER): Payer: Medicare HMO

## 2015-01-23 DIAGNOSIS — J309 Allergic rhinitis, unspecified: Secondary | ICD-10-CM | POA: Diagnosis not present

## 2015-01-28 ENCOUNTER — Encounter: Payer: Medicare HMO | Admitting: Family Medicine

## 2015-01-30 ENCOUNTER — Ambulatory Visit (INDEPENDENT_AMBULATORY_CARE_PROVIDER_SITE_OTHER): Payer: Medicare HMO

## 2015-01-30 DIAGNOSIS — J309 Allergic rhinitis, unspecified: Secondary | ICD-10-CM | POA: Diagnosis not present

## 2015-02-06 ENCOUNTER — Ambulatory Visit: Payer: Medicare HMO

## 2015-02-06 ENCOUNTER — Ambulatory Visit (INDEPENDENT_AMBULATORY_CARE_PROVIDER_SITE_OTHER): Payer: Medicare HMO

## 2015-02-06 DIAGNOSIS — J309 Allergic rhinitis, unspecified: Secondary | ICD-10-CM | POA: Diagnosis not present

## 2015-02-13 ENCOUNTER — Ambulatory Visit (INDEPENDENT_AMBULATORY_CARE_PROVIDER_SITE_OTHER): Payer: Medicare HMO

## 2015-02-13 DIAGNOSIS — J309 Allergic rhinitis, unspecified: Secondary | ICD-10-CM | POA: Diagnosis not present

## 2015-02-18 ENCOUNTER — Telehealth: Payer: Self-pay | Admitting: Internal Medicine

## 2015-02-18 DIAGNOSIS — J309 Allergic rhinitis, unspecified: Secondary | ICD-10-CM | POA: Diagnosis not present

## 2015-02-18 NOTE — Telephone Encounter (Signed)
Allergy Serum Extract Date Mixed: 02/18/15  Vial: 2 Strength: 1:50 Here/Mail/Pick Up: here Mixed By: tbs Last OV: 12/13/24 Pending OV: 12/16/15

## 2015-02-20 ENCOUNTER — Ambulatory Visit (INDEPENDENT_AMBULATORY_CARE_PROVIDER_SITE_OTHER): Payer: Medicare HMO

## 2015-02-20 ENCOUNTER — Ambulatory Visit: Payer: Medicare HMO

## 2015-02-20 DIAGNOSIS — J309 Allergic rhinitis, unspecified: Secondary | ICD-10-CM

## 2015-02-27 ENCOUNTER — Encounter: Payer: Self-pay | Admitting: Internal Medicine

## 2015-02-27 ENCOUNTER — Ambulatory Visit (INDEPENDENT_AMBULATORY_CARE_PROVIDER_SITE_OTHER): Payer: Medicare HMO

## 2015-02-27 DIAGNOSIS — J309 Allergic rhinitis, unspecified: Secondary | ICD-10-CM | POA: Diagnosis not present

## 2015-03-01 ENCOUNTER — Telehealth: Payer: Self-pay | Admitting: Family Medicine

## 2015-03-01 DIAGNOSIS — R1011 Right upper quadrant pain: Secondary | ICD-10-CM

## 2015-03-01 NOTE — Telephone Encounter (Signed)
Yes, fatty liver can cause intermittent abdominal pain  However - I am still concerned this is going on  Has she seen GI about this ? I may be interested in getting a HIDA scan to assess her gallbladder function as well (even though it looked fine on [place] ) Let me know what she thinks  Wt loss and low fat diet do help with fatty liver in the meantime

## 2015-03-01 NOTE — Telephone Encounter (Signed)
Patient Name: [name] DOB: 05-11-1965 Initial Comment Caller States she has fatty liver, wants to know if it is normal for it to come and go, pain is in the upper abd Nurse Assessment Nurse: Vallery Sa, RN, [place] Date/Time (Eastern Time): 03/01/2015 9:05:53 AM Confirm and document reason for call. If symptomatic, describe symptoms. You must click the next button to save text entered. ---Caller states she developed upper abdominal pain about 3 days ago (rated as 5 on the 1 to 10 scale). No fever. No injury in the past 3 days. No vomiting or diarrhea. Alert and responsive. Has the patient traveled out of the country within the last 30 days? ---No Does the patient have any new or worsening symptoms? ---Yes Will a triage be completed? ---Yes Related visit to physician within the last 2 weeks? ---Yes Does the PT have any chronic conditions? (i.e. diabetes, asthma, etc.) ---Yes List chronic conditions. ---High Blood Pressure, Fatty Liver, Chronic eye problems Is the patient pregnant or possibly pregnant? (Ask all females between the ages of 47-55) ---No Is this a behavioral health or substance abuse call? ---No Guidelines Guideline Title Affirmed Question Affirmed Notes Abdominal Pain - Upper [1] MILD-MODERATE pain AND [2] not relieved by antacids Final Disposition User See Physician within 4 Hours (or PCP triage) Vallery Sa, RN, [place] declined the See MD within 4 Hour disposition and states she will see how she is doing. Encouraged to call back with any concerns or questions. Caller aware MD would have to advise if abdominal pain is from Fatty Liver.  Referrals GO TO FACILITY REFUSED Disagree/Comply: Disagree Disagree/Comply Reason: Disagree with instructions

## 2015-03-04 NOTE — Telephone Encounter (Signed)
Pt said pain is doing better and no as bad but it is intermittent. Pt said she hasn't seen a GI doctor, pt is fine with what every Dr. Glori Bickers thinks she should do, she is okay with referral to GI or getting a HIDA scan. Pt did say when she has the abd pain its in the middle of her abd right above her belly button so she isn't sure if you still want her to have the HIDA scan or not given location of pain. Pt also wanted to ask Dr. Glori Bickers if she thinks the pain could be a side eff of taking methotrexate?

## 2015-03-04 NOTE — Telephone Encounter (Signed)
Left voicemail letting pt know referral done and advised of Dr. Marliss Coots comments and that our [facility] will call to schedule appt

## 2015-03-04 NOTE — Telephone Encounter (Signed)
I will refer her to GI  I do not suspect this is a methotrexate side effect at this time but can't totally rule it out (if liver was affected I think labs and [place] would look different) Will route the ref to [facility]

## 2015-03-05 ENCOUNTER — Encounter: Payer: Self-pay | Admitting: Gastroenterology

## 2015-03-06 ENCOUNTER — Ambulatory Visit: Payer: Medicare HMO

## 2015-03-06 ENCOUNTER — Ambulatory Visit (INDEPENDENT_AMBULATORY_CARE_PROVIDER_SITE_OTHER): Payer: Medicare HMO | Admitting: *Deleted

## 2015-03-06 DIAGNOSIS — J309 Allergic rhinitis, unspecified: Secondary | ICD-10-CM

## 2015-03-10 ENCOUNTER — Telehealth: Payer: Self-pay | Admitting: Family Medicine

## 2015-03-10 DIAGNOSIS — Z0000 Encounter for general adult medical examination without abnormal findings: Secondary | ICD-10-CM

## 2015-03-10 DIAGNOSIS — R739 Hyperglycemia, unspecified: Secondary | ICD-10-CM

## 2015-03-10 DIAGNOSIS — I10 Essential (primary) hypertension: Secondary | ICD-10-CM

## 2015-03-10 NOTE — Telephone Encounter (Signed)
-----   Message from Ellamae Sia sent at 03/04/2015 11:19 AM EST ----- Regarding: Lab orders for Wednesday, 3.8.17 Patient is scheduled for CPX labs, please order future labs, Thanks , Karna Christmas

## 2015-03-13 ENCOUNTER — Other Ambulatory Visit (INDEPENDENT_AMBULATORY_CARE_PROVIDER_SITE_OTHER): Payer: Medicare HMO

## 2015-03-13 ENCOUNTER — Ambulatory Visit (INDEPENDENT_AMBULATORY_CARE_PROVIDER_SITE_OTHER): Payer: Medicare HMO | Admitting: *Deleted

## 2015-03-13 DIAGNOSIS — J309 Allergic rhinitis, unspecified: Secondary | ICD-10-CM

## 2015-03-13 DIAGNOSIS — R739 Hyperglycemia, unspecified: Secondary | ICD-10-CM

## 2015-03-13 DIAGNOSIS — I10 Essential (primary) hypertension: Secondary | ICD-10-CM | POA: Diagnosis not present

## 2015-03-13 LAB — TSH: TSH: 3.86 u[IU]/mL (ref 0.35–4.50)

## 2015-03-13 LAB — LIPID PANEL
CHOL/HDL RATIO: 4
Cholesterol: 156 mg/dL (ref 0–200)
HDL: 43 mg/dL (ref >39.00)
LDL CALC: 98 mg/dL (ref 0–99)
NonHDL: 113.06
TRIGLYCERIDES: 77 mg/dL (ref 0.0–149.0)
VLDL: 15.4 mg/dL (ref 0.0–40.0)

## 2015-03-13 LAB — COMPREHENSIVE METABOLIC PANEL
ALT: 12 U/L (ref 0–35)
AST: 28 U/L (ref 0–37)
Albumin: 3.9 g/dL (ref 3.5–5.2)
Alkaline Phosphatase: 83 U/L (ref 39–117)
BUN: 15 mg/dL (ref 6–23)
CALCIUM: 9.2 mg/dL (ref 8.4–10.5)
CHLORIDE: 105 meq/L (ref 96–112)
CO2: 28 meq/L (ref 19–32)
CREATININE: 0.87 mg/dL (ref 0.40–1.20)
GFR: 73.95 mL/min (ref >60.00)
Glucose, Bld: 108 mg/dL — ABNORMAL HIGH (ref 70–99)
Potassium: 3.8 mEq/L (ref 3.5–5.1)
SODIUM: 141 meq/L (ref 135–145)
Total Bilirubin: 0.5 mg/dL (ref 0.2–1.2)
Total Protein: 6.7 g/dL (ref 6.0–8.3)

## 2015-03-13 LAB — CBC WITH DIFFERENTIAL/PLATELET
BASOS ABS: 0 10*3/uL (ref 0.0–0.1)
Basophils Relative: 0.6 % (ref 0.0–3.0)
EOS ABS: 0.2 10*3/uL (ref 0.0–0.7)
Eosinophils Relative: 3.7 % (ref 0.0–5.0)
HEMATOCRIT: 41.2 % (ref 36.0–46.0)
Hemoglobin: 14 g/dL (ref 12.0–15.0)
LYMPHS ABS: 2.1 10*3/uL (ref 0.7–4.0)
LYMPHS PCT: 38.3 % (ref 12.0–46.0)
MCHC: 34 g/dL (ref 30.0–36.0)
MCV: 88.3 fl (ref 78.0–100.0)
Monocytes Absolute: 0.5 10*3/uL (ref 0.1–1.0)
Monocytes Relative: 8.2 % (ref 3.0–12.0)
NEUTROS ABS: 2.7 10*3/uL (ref 1.4–7.7)
NEUTROS PCT: 49.2 % (ref 43.0–77.0)
PLATELETS: 284 10*3/uL (ref 150.0–400.0)
RBC: 4.66 Mil/uL (ref 3.87–5.11)
RDW: 15.9 % — ABNORMAL HIGH (ref 11.5–15.5)
WBC: 5.6 10*3/uL (ref 4.0–10.5)

## 2015-03-13 LAB — HEMOGLOBIN A1C: HEMOGLOBIN A1C: 6.3 % (ref 4.6–6.5)

## 2015-03-20 ENCOUNTER — Ambulatory Visit (INDEPENDENT_AMBULATORY_CARE_PROVIDER_SITE_OTHER): Payer: Medicare HMO | Admitting: Family Medicine

## 2015-03-20 ENCOUNTER — Ambulatory Visit (INDEPENDENT_AMBULATORY_CARE_PROVIDER_SITE_OTHER): Payer: Medicare HMO

## 2015-03-20 ENCOUNTER — Ambulatory Visit: Payer: Medicare HMO

## 2015-03-20 ENCOUNTER — Encounter: Payer: Self-pay | Admitting: Family Medicine

## 2015-03-20 VITALS — BP 126/82 | HR 70 | Temp 98.7°F | Ht 68.0 in | Wt 208.2 lb

## 2015-03-20 DIAGNOSIS — I10 Essential (primary) hypertension: Secondary | ICD-10-CM

## 2015-03-20 DIAGNOSIS — Z0000 Encounter for general adult medical examination without abnormal findings: Secondary | ICD-10-CM

## 2015-03-20 DIAGNOSIS — R739 Hyperglycemia, unspecified: Secondary | ICD-10-CM

## 2015-03-20 MED ORDER — BUPROPION HCL ER (XL) 300 MG PO TB24: 300.0000 mg | ORAL_TABLET | Freq: Every day | ORAL | Status: DC

## 2015-03-20 MED ORDER — METHOCARBAMOL 500 MG PO TABS: ORAL_TABLET | ORAL | Status: DC

## 2015-03-20 MED ORDER — TRAMADOL HCL 50 MG PO TABS: ORAL_TABLET | ORAL | Status: DC

## 2015-03-20 MED ORDER — CARVEDILOL 6.25 MG PO TABS: 6.2500 mg | ORAL_TABLET | Freq: Two times a day (BID) | ORAL | Status: DC

## 2015-03-20 NOTE — Progress Notes (Signed)
Subjective:   [name] is a 50 y.o. female who presents for Medicare Annual (Subsequent) preventive examination.   Cardiac Risk Factors include: dyslipidemia;hypertension;sedentary lifestyle;obesity (BMI >30kg/m2)     Objective:     Vitals: BP 126/82 mmHg  Pulse 70  Temp(Src) 98.7 F (37.1 C) (Oral)  Ht 5\' 8"  (1.727 m)  Wt 208 lb 4 oz (94.462 kg)  BMI 31.67 kg/m2  SpO2 96%  LMP 08/05/2006  Tobacco History  Smoking status  . Never Smoker   Smokeless tobacco  . Never Used     Counseling given: No   Past Medical History  Diagnosis Date  . Allergy     allergic rhinitis  . Asthma   . Depression   . GERD (gastroesophageal reflux disease)   . Hypertension   . Fibromyalgia   . Degenerative disc disease   . Sleep apnea   . Fx of fibula 2009  . IBS (irritable bowel syndrome)   . Chronic headaches   . Hemorrhoids   . Iritis, chronic   . Macular hole of right eye    Past Surgical History  Procedure Laterality Date  . Laparoscopy  08/2004    endometriosis - several   . Breast surgery      left nipple removed - benign tumor  . Anterior cruciate ligament repair  1996    small tear of ACL  . Bunionectomy    . Tonsillectomy    . Abdominal hysterectomy     Family History  Problem Relation Age of Onset  . Hypertension Mother   . Thyroid disease Mother   . Fibromyalgia Mother   . Diabetes Father   . Kidney cancer Maternal Grandmother   . Colon cancer Neg Hx   . Colon polyps Mother    History  Sexual Activity  . Sexual Activity: No    Outpatient Encounter Prescriptions as of 03/20/2015  Medication Sig  . acetaminophen (TYLENOL) 500 MG tablet OTC as directed.   Marland Kitchen albuterol (PROAIR HFA) 108 (90 BASE) MCG/ACT inhaler INHALE 2 PUFFS EVERY 4 HOURS AS NEEDED FOR WHEEZING  . diphenhydrAMINE (BENADRYL) 25 mg capsule OTC as directed.   Marland Kitchen esomeprazole (NEXIUM) 20 MG capsule Take 1 capsule (20 mg total) by mouth daily at 12 noon.  . Fluticasone-Salmeterol  (ADVAIR DISKUS) 100-50 MCG/DOSE AEPB Inhale 1 puff then rinse mouth, twice daily maintenance  . folic acid (FOLVITE) 1 MG tablet Take 2 mg by mouth daily.   Marland Kitchen ibuprofen (ADVIL,MOTRIN) 200 MG tablet Take 200 mg by mouth every 6 (six) hours as needed.  Marland Kitchen leucovorin (WELLCOVORIN) 5 MG tablet Take 5 mg by mouth 2 (two) times a week.  . lidocaine (XYLOCAINE) 2 % solution Apply 1 application topically every 2 (two) hours as needed. To sores in mouth - Topical  . losartan (COZAAR) 50 MG tablet Take 1 tablet (50 mg total) by mouth daily.  . methotrexate (RHEUMATREX) 2.5 MG tablet Take 25 mg by mouth once a week. Caution:Chemotherapy. Protect from light.  . montelukast (SINGULAIR) 10 MG tablet Take 1 tablet (10 mg total) by mouth at bedtime.  . NONFORMULARY OR COMPOUNDED ITEM Allergy Vaccine 1:50 Given at [facility]  . prednisoLONE acetate (PRED FORTE) 1 % ophthalmic suspension Place 1 drop into both eyes daily.   . [DISCONTINUED] buPROPion (WELLBUTRIN XL) 300 MG 24 hr tablet TAKE ONE TABLET BY MOUTH ONCE DAILY  . [DISCONTINUED] carvedilol (COREG) 6.25 MG tablet Take 1 tablet (6.25 mg total) by mouth 2 (two)  times daily with a meal.  . [DISCONTINUED] methocarbamol (ROBAXIN) 500 MG tablet TAKE 1 TABLET (500 MG TOTAL) BY MOUTH AT BEDTIME AS NEEDED.  . [DISCONTINUED] traMADol (ULTRAM) 50 MG tablet TAKE 1 TABLET (50 MG TOTAL) BY MOUTH EVERY 8 (EIGHT) HOURS AS NEEDED FOR PAIN.   No facility-administered encounter medications on file as of 03/20/2015.    Activities of Daily Living In your present state of health, do you have any difficulty performing the following activities: 03/20/2015  Hearing? N  Vision? N  Difficulty concentrating or making decisions? N  Walking or climbing stairs? N  Dressing or bathing? N  Doing errands, shopping? N  Preparing Food and eating ? N  Using the Toilet? N  In the past six months, have you accidently leaked urine? N  Do you have problems with loss of bowel  control? N  Managing your Medications? N  Managing your Finances? N  Housekeeping or managing your Housekeeping? N    Patient Care Team: Abner Greenspan, MD as PCP - General Deneise Lever, MD as Consulting Physician (Pulmonary Disease) Carmelina Dane, MD as Referring Physician (Ophthalmology) Candee Furbish, MD as Referring Physician (Internal Medicine) Sharyne Peach, MD as Consulting Physician (Ophthalmology)    Assessment:      Hearing Screening   125Hz  250Hz  500Hz  1000Hz  2000Hz  4000Hz  8000Hz   Right ear:   40 40 40 40   Left ear:   40 40 40 40   Vision Screening Comments: Last eye exam was 04/2014 with Dr. Delman Cheadle   Exercise Activities and Dietary recommendations Current Exercise Habits: The patient does not participate in regular exercise at present, Exercise limited by: None identified  Goals    . Reduce sugar intake to X grams per day     Starting 03/20/2015, I will continue to decrease intake of sugar to less than 6 tsps per day.       Fall Risk Fall Risk  03/20/2015 01/24/2014 11/02/2012  Falls in the past year? Yes Yes No  Number falls in past yr: 1 1 -  Injury with Fall? No Yes -  Follow up Falls evaluation completed - -   Depression Screen PHQ 2/9 Scores 03/20/2015 01/24/2014 11/02/2012  PHQ - 2 Score 0 0 0     Cognitive Testing MMSE - Mini Mental State Exam 03/20/2015  Orientation to time 5  Orientation to Place 5  Registration 3  Attention/ Calculation 5  Recall 3  Language- name 2 objects 0  Language- repeat 1  Language- follow 3 step command 3  Language- read & follow direction 1  Write a sentence 0  Copy design 0  Total score 26    Immunization History  Administered Date(s) Administered  . Influenza Split 10/29/2010, 10/02/2011  . Influenza Whole 10/13/2006, 10/03/2008, 10/09/2009  . Influenza,inj,Quad PF,36+ Mos 09/14/2012, 10/11/2013  . Influenza-Unspecified 09/18/2014  . PPD Test 03/24/2012  . Pneumococcal Polysaccharide-23 11/02/2012  . Tdap  07/20/2011   Screening Tests Health Maintenance  Topic Date Due  . MAMMOGRAM  06/06/2015 (Originally 11/16/2012)  . PAP SMEAR  02/07/2020 (Originally 05/06/2007)  . HIV Screening  02/07/2020 (Originally 06/19/1982)  . INFLUENZA VACCINE  08/06/2015  . TETANUS/TDAP  07/19/2021      Plan:     I have personally reviewed the Medicare Annual Wellness questionnaire and have noted the following in the patient's chart:  A. Medical and social history B. Use of alcohol, tobacco or illicit drugs  C. Current medications and supplements D. Functional ability  and status E.  Nutritional status F.  Physical activity G. Advance directives H. List of other physicians I.  Hospitalizations, surgeries, and ER visits in previous 12 months J.  [place] to include hearing, vision, cognitive, depression L. Referrals and appointments - none  In addition, I reviewed preventive protocols, quality metrics, and best practice recommendations specific to patient. A written personalized care plan for preventive services as well as general preventive health recommendations were provided to patient. Patient plans to schedule mammogram and identify place and date of most recent HIV screening.   See attached scanned questionnaire for additional information.   Signed,   Lindell Noe, MHA, BS, LPN Health Advisor [id number]

## 2015-03-20 NOTE — Progress Notes (Signed)
Subjective:    Patient ID: [name], female    DOB: 12/14/1965, 50 y.o.   MRN: [id number]  HPI Here for health maintenance exam and to review chronic medical problems    Is fighting a uri right now - started sat  No fever    Saw Lesia for AMW this am   Wt is stable with bmi of 31 - obese   HIV screening -not interested / not high risk  On biologic med -had to be screened in the past   Mammogram 11/13- needs to get back on imaging  Self breast exam-no lumps   No longer gets pap- had a hysterectomy No gyn c/o or problems   Had a nl colonosc in 4/12- recommended starting screening at 78 Mother had colon polyps Has appt with GI April 25th - her upper abd pain subsided   Recent abd US showed fatty liver Lab Results  Component Value Date   ALT 12 03/13/2015   AST 28 03/13/2015   ALKPHOS 83 03/13/2015   BILITOT 0.5 03/13/2015   weight loss is her goal  She is cutting back on sweets/junk food to start   Hyperglycemia Lab Results  Component Value Date   HGBA1C 6.3 03/13/2015  was 6.1  Wants to loose weight and get rid of excess sugar and sweets     Hyperlipidemia Lab Results  Component Value Date   CHOL 156 03/13/2015   CHOL 175 01/17/2014   CHOL 178 10/11/2012   Lab Results  Component Value Date   HDL 43.00 03/13/2015   HDL 54.20 01/17/2014   HDL 49.00 10/11/2012   Lab Results  Component Value Date   LDLCALC 98 03/13/2015   LDLCALC 94 01/17/2014   LDLCALC 108* 10/11/2012   Lab Results  Component Value Date   TRIG 77.0 03/13/2015   TRIG 135.0 01/17/2014   TRIG 106.0 10/11/2012   Lab Results  Component Value Date   CHOLHDL 4 03/13/2015   CHOLHDL 3 01/17/2014   CHOLHDL 4 10/11/2012   No results found for: LDLDIRECT  Is eating occ hamburgers  Will work on getting HDL up  Exercise is challenging with her fibromyalgia -needs to do low impact exercise due to back and leg pain    bp is stable today  No cp or palpitations or headaches or  edema  No side effects to medicines  BP Readings from Last 3 Encounters:  03/20/15 126/82  03/20/15 126/82  01/08/15 128/80       Chemistry      Component Value Date/Time   NA 141 03/13/2015 0741   K 3.8 03/13/2015 0741   CL 105 03/13/2015 0741   CO2 28 03/13/2015 0741   BUN 15 03/13/2015 0741   CREATININE 0.87 03/13/2015 0741   CREATININE 0.96 10/02/2011 1615      Component Value Date/Time   CALCIUM 9.2 03/13/2015 0741   ALKPHOS 83 03/13/2015 0741   AST 28 03/13/2015 0741   ALT 12 03/13/2015 0741   BILITOT 0.5 03/13/2015 0741      Lab Results  Component Value Date   TSH 3.86 03/13/2015   Lab Results  Component Value Date   WBC 5.6 03/13/2015   HGB 14.0 03/13/2015   HCT 41.2 03/13/2015   MCV 88.3 03/13/2015   PLT 284.0 03/13/2015    Patient Active Problem List   Diagnosis Date Noted  . Abdominal pain, right upper quadrant 01/08/2015  . Right foot pain 04/16/2014  . Elevated TSH 01/24/2014  .  Knee pain, right 10/01/2013  . Nasal septal deviation 01/03/2013  . Encounter for Medicare annual wellness exam 11/02/2012  . Hydradenitis 11/02/2012  . Hyperglycemia 03/23/2012  . Macular hole 03/23/2012  . Screening-pulmonary TB 03/23/2012  . Routine general medical examination at a health care facility 10/02/2011  . Iritis, recurrent 10/02/2011  . Tachycardia 10/02/2011  . Other screening mammogram 10/02/2011  . ADD (attention deficit disorder) 08/20/2010  . DYSHIDROTIC ECZEMA, HANDS 11/12/2009  . Obstructive sleep apnea 11/19/2006  . DEPRESSION 09/29/2006  . Essential hypertension 09/29/2006  . Seasonal and perennial allergic rhinitis 09/29/2006  . Asthma, mild intermittent, well-controlled 09/29/2006  . GERD 09/29/2006  . IBS 09/29/2006  . ENDOMETRIOSIS 09/29/2006  . FEMALE INFERTILITY 09/29/2006  . FIBROMYALGIA 09/29/2006  . INSOMNIA 09/29/2006   Past Medical History  Diagnosis Date  . Allergy     allergic rhinitis  . Asthma   . Depression   . GERD  (gastroesophageal reflux disease)   . Hypertension   . Fibromyalgia   . Degenerative disc disease   . Sleep apnea   . Fx of fibula 2009  . IBS (irritable bowel syndrome)   . Chronic headaches   . Hemorrhoids   . Iritis, chronic   . Macular hole of right eye    Past Surgical History  Procedure Laterality Date  . Laparoscopy  08/2004    endometriosis - several   . Breast surgery      left nipple removed - benign tumor  . Anterior cruciate ligament repair  1996    small tear of ACL  . Bunionectomy    . Tonsillectomy    . Abdominal hysterectomy     Social History  Substance Use Topics  . Smoking status: Never Smoker   . Smokeless tobacco: Never Used  . Alcohol Use: No   Family History  Problem Relation Age of Onset  . Hypertension Mother   . Thyroid disease Mother   . Fibromyalgia Mother   . Diabetes Father   . Kidney cancer Maternal Grandmother   . Colon cancer Neg Hx   . Colon polyps Mother    Allergies  Allergen Reactions  . Cetirizine Hcl     REACTION: not effective  . Erythromycin     REACTION: stomach hurts  . Escitalopram Oxalate     REACTION: no improvement  . Lisinopril     REACTION: cough  . Loratadine     REACTION: not effective  . Nifedipine     REACTION: fatigued and tremor  . Sertraline Hcl     REACTION: no help  . Vyvanse [Lisdexamfetamine Dimesylate]     Headache and excitablility   Current Outpatient Prescriptions on File Prior to Visit  Medication Sig Dispense Refill  . acetaminophen (TYLENOL) 500 MG tablet OTC as directed.     Marland Kitchen albuterol (PROAIR HFA) 108 (90 BASE) MCG/ACT inhaler INHALE 2 PUFFS EVERY 4 HOURS AS NEEDED FOR WHEEZING 1 Inhaler 11  . buPROPion (WELLBUTRIN XL) 300 MG 24 hr tablet TAKE ONE TABLET BY MOUTH ONCE DAILY 90 tablet 3  . carvedilol (COREG) 6.25 MG tablet Take 1 tablet (6.25 mg total) by mouth 2 (two) times daily with a meal. 180 tablet 1  . diphenhydrAMINE (BENADRYL) 25 mg capsule OTC as directed.     Marland Kitchen  esomeprazole (NEXIUM) 20 MG capsule Take 1 capsule (20 mg total) by mouth daily at 12 noon. 30 capsule 11  . Fluticasone-Salmeterol (ADVAIR DISKUS) 100-50 MCG/DOSE AEPB Inhale 1 puff then rinse mouth,  twice daily maintenance 60 each prn  . folic acid (FOLVITE) 1 MG tablet Take 2 mg by mouth daily.     Marland Kitchen ibuprofen (ADVIL,MOTRIN) 200 MG tablet Take 200 mg by mouth every 6 (six) hours as needed.    . lidocaine (XYLOCAINE) 2 % solution Apply 1 application topically every 2 (two) hours as needed. To sores in mouth - Topical 20 mL 2  . losartan (COZAAR) 50 MG tablet Take 1 tablet (50 mg total) by mouth daily. 90 tablet 3  . methocarbamol (ROBAXIN) 500 MG tablet TAKE 1 TABLET (500 MG TOTAL) BY MOUTH AT BEDTIME AS NEEDED. 30 tablet 1  . methotrexate (RHEUMATREX) 2.5 MG tablet Take 25 mg by mouth once a week. Caution:Chemotherapy. Protect from light.    . montelukast (SINGULAIR) 10 MG tablet Take 1 tablet (10 mg total) by mouth at bedtime. 90 tablet 3  . NONFORMULARY OR COMPOUNDED ITEM Allergy Vaccine 1:50 Given at [facility]    . prednisoLONE acetate (PRED FORTE) 1 % ophthalmic suspension Place 1 drop into both eyes daily.     . traMADol (ULTRAM) 50 MG tablet TAKE 1 TABLET (50 MG TOTAL) BY MOUTH EVERY 8 (EIGHT) HOURS AS NEEDED FOR PAIN. 30 tablet 1   No current facility-administered medications on file prior to visit.    Review of Systems Review of Systems  Constitutional: Negative for fever, appetite change,  and unexpected weight change.  ENT pos for cong/rhinorrhea and pnd from a mild cold  Eyes: Negative for pain and pos for baseline symptoms from iritis Respiratory: Negative for cough and shortness of breath.   Cardiovascular: Negative for cp or palpitations    Gastrointestinal: Negative for nausea, diarrhea and constipation.  Genitourinary: Negative for urgency and frequency.  Skin: Negative for pallor or rash   MSK pos for chronic joint and myofascial pain  Neurological: Negative  for weakness, light-headedness, numbness and headaches.  Hematological: Negative for adenopathy. Does not bruise/bleed easily.  Psychiatric/Behavioral: Negative for dysphoric mood. The patient is not nervous/anxious.         Objective:   Physical Exam  Constitutional: She appears well-developed and well-nourished. No distress.  obese and well appearing   HENT:  Head: Normocephalic and atraumatic.  Right Ear: External ear normal.  Left Ear: External ear normal.  Mouth/Throat: Oropharynx is clear and moist.  Nares are injected and congested  Clear pnd   No sinus tenderness  Eyes: Conjunctivae and EOM are normal. Pupils are equal, round, and reactive to light. No scleral icterus.  Neck: Normal range of motion. Neck supple. No JVD present. Carotid bruit is not present. No thyromegaly present.  Cardiovascular: Normal rate, regular rhythm, normal heart sounds and intact distal pulses.  Exam reveals no gallop.   Pulmonary/Chest: Effort normal and breath sounds normal. No respiratory distress. She has no wheezes. She exhibits no tenderness.  Abdominal: Soft. Bowel sounds are normal. She exhibits no distension, no abdominal bruit and no mass. There is no tenderness.  Genitourinary: No breast swelling, tenderness, discharge or bleeding.  Breast exam: No mass, nodules, thickening, tenderness, bulging, retraction, inflamation, nipple discharge or skin changes noted.  No axillary or clavicular LA.      Musculoskeletal: Normal range of motion. She exhibits no edema.  Baseline myofascial trigger points   Lymphadenopathy:    She has no cervical adenopathy.  Neurological: She is alert. She has normal reflexes. No cranial nerve deficit. She exhibits normal muscle tone. Coordination normal.  Skin: Skin is warm and dry.  No rash noted. No erythema. No pallor.  Solar lentigines diffusely  Psychiatric: She has a normal mood and affect.          Assessment & Plan:   Problem List Items Addressed  This Visit      Cardiovascular and Mediastinum   Essential hypertension - Primary    bp in fair control at this time  BP Readings from Last 1 Encounters:  03/20/15 126/82   No changes needed Disc lifstyle change with low sodium diet and exercise  Labs reviewed  Wt loss enc      Relevant Medications   carvedilol (COREG) 6.25 MG tablet     Other   Hyperglycemia    Controlled with low glycemic diet Wt loss enc to prev DM Lab Results  Component Value Date   HGBA1C 6.3 03/13/2015   This is up slightly-will continue to follow      Routine general medical examination at a health care facility    Reviewed health habits including diet and exercise and skin cancer prevention Reviewed appropriate screening tests for age  Also reviewed health mt list, fam hx and immunization status , as well as social and family history   AMW reviewed See HPI Labs reviewed  Low impact exercise like swimming and exercise bike are best for you  Don't forget to make your own mammogram appointment at the breast center  Keep working on weight loss with low sugar/low fat diet

## 2015-03-20 NOTE — Assessment & Plan Note (Signed)
Reviewed health habits including diet and exercise and skin cancer prevention Reviewed appropriate screening tests for age  Also reviewed health mt list, fam hx and immunization status , as well as social and family history   AMW reviewed See HPI Labs reviewed  Low impact exercise like swimming and exercise bike are best for you  Don't forget to make your own mammogram appointment at the breast center  Keep working on weight loss with low sugar/low fat diet

## 2015-03-20 NOTE — Assessment & Plan Note (Signed)
bp in fair control at this time  BP Readings from Last 1 Encounters:  03/20/15 126/82   No changes needed Disc lifstyle change with low sodium diet and exercise  Labs reviewed  Wt loss enc

## 2015-03-20 NOTE — Patient Instructions (Addendum)
Drink lots of fluids and rest - mucinex for congestion and cough  Low impact exercise like swimming and exercise bike are best for you  Don't forget to make your own mammogram appointment at the breast center  Keep working on weight loss with low sugar/low fat diet   Take care of yourself

## 2015-03-20 NOTE — Assessment & Plan Note (Signed)
Controlled with low glycemic diet Wt loss enc to prev DM Lab Results  Component Value Date   HGBA1C 6.3 03/13/2015   This is up slightly-will continue to follow

## 2015-03-20 NOTE — Patient Instructions (Signed)
[name] , Thank you for taking time to come for your Medicare Wellness Visit. I appreciate your ongoing commitment to your health goals. Please review the following plan we discussed and let me know if I can assist you in the future.   These are the goals we discussed: Goals    . Reduce sugar intake to X grams per day     Starting 03/20/2015, I will continue to decrease intake of sugar to less than 6 tsps per day.        This is a list of the screening recommended for you and due dates:  Health Maintenance  Topic Date Due  . HIV Screening  06/19/1982  . Mammogram  06/06/2015*  . Pap Smear  02/07/2020*  . Flu Shot  08/06/2015  . Tetanus Vaccine  07/19/2021  *Topic was postponed. The date shown is not the original due date.   Preventive Care for Adults  A healthy lifestyle and preventive care can promote health and wellness. Preventive health guidelines for adults include the following key practices.  . A routine yearly physical is a good way to check with your health care provider about your health and preventive screening. It is a chance to share any concerns and updates on your health and to receive a thorough exam.  . Visit your dentist for a routine exam and preventive care every 6 months. Brush your teeth twice a day and floss once a day. Good oral hygiene prevents tooth decay and gum disease.  . The frequency of eye exams is based on your age, health, family medical history, use  of contact lenses, and other factors. Follow your health care provider's ecommendations for frequency of eye exams.  . Eat a healthy diet. Foods like vegetables, fruits, whole grains, low-fat dairy products, and lean protein foods contain the nutrients you need without too many calories. Decrease your intake of foods high in solid fats, added sugars, and salt. Eat the right amount of calories for you. Get information about a proper diet from your health care provider, if necessary.  . Regular  physical exercise is one of the most important things you can do for your health. Most adults should get at least 150 minutes of moderate-intensity exercise (any activity that increases your heart rate and causes you to sweat) each week. In addition, most adults need muscle-strengthening exercises on 2 or more days a week.  Silver Sneakers may be a benefit available to you. To determine eligibility, you may visit the website: www.silversneakers.com or contact program at 920-627-1817 Mon-Fri between 8AM-8PM.   . Maintain a healthy weight. The body mass index (BMI) is a screening tool to identify possible weight problems. It provides an estimate of body fat based on height and weight. Your health care provider can find your BMI and can help you achieve or maintain a healthy weight.   For adults 20 years and older: ? A BMI below 18.5 is considered underweight. ? A BMI of 18.5 to 24.9 is normal. ? A BMI of 25 to 29.9 is considered overweight. ? A BMI of 30 and above is considered obese.   . Maintain normal blood lipids and cholesterol levels by exercising and minimizing your intake of saturated fat. Eat a balanced diet with plenty of fruit and vegetables. Blood tests for lipids and cholesterol should begin at age 67 and be repeated every 5 years. If your lipid or cholesterol levels are high, you are over 50, or you are at high  risk for heart disease, you may need your cholesterol levels checked more frequently. Ongoing high lipid and cholesterol levels should be treated with medicines if diet and exercise are not working.  . If you smoke, find out from your health care provider how to quit. If you do not use tobacco, please do not start.  . If you choose to drink alcohol, please do not consume more than 2 drinks per day. One drink is considered to be 12 ounces (355 mL) of beer, 5 ounces (148 mL) of wine, or 1.5 ounces (44 mL) of liquor.  . If you are 73-67 years old, ask your health care provider if  you should take aspirin to prevent strokes.  . Use sunscreen. Apply sunscreen liberally and repeatedly throughout the day. You should seek shade when your shadow is shorter than you. Protect yourself by wearing long sleeves, pants, a wide-brimmed hat, and sunglasses year round, whenever you are outdoors.  . Once a month, do a whole body skin exam, using a mirror to look at the skin on your back. Tell your health care provider of new moles, moles that have irregular borders, moles that are larger than a pencil eraser, or moles that have changed in shape or color.

## 2015-03-20 NOTE — Progress Notes (Signed)
Pre visit review using our clinic review tool, if applicable. No additional management support is needed unless otherwise documented below in the visit note. 

## 2015-03-21 NOTE — Progress Notes (Signed)
   Subjective:    Patient ID: [name], female    DOB: [birth year], 50 y.o.   MRN: [id number]  HPI    Review of Systems     Objective:   Physical Exam        Assessment & Plan:  I reviewed health advisor's note, was available for consultation, and agree with documentation and plan.

## 2015-03-26 DIAGNOSIS — H26493 Other secondary cataract, bilateral: Secondary | ICD-10-CM | POA: Diagnosis not present

## 2015-03-27 ENCOUNTER — Ambulatory Visit (INDEPENDENT_AMBULATORY_CARE_PROVIDER_SITE_OTHER): Payer: Medicare HMO | Admitting: *Deleted

## 2015-03-27 DIAGNOSIS — J309 Allergic rhinitis, unspecified: Secondary | ICD-10-CM

## 2015-04-01 DIAGNOSIS — H26492 Other secondary cataract, left eye: Secondary | ICD-10-CM | POA: Diagnosis not present

## 2015-04-02 ENCOUNTER — Ambulatory Visit (INDEPENDENT_AMBULATORY_CARE_PROVIDER_SITE_OTHER): Payer: Medicare HMO | Admitting: *Deleted

## 2015-04-02 DIAGNOSIS — J309 Allergic rhinitis, unspecified: Secondary | ICD-10-CM

## 2015-04-03 ENCOUNTER — Ambulatory Visit: Payer: Medicare HMO

## 2015-04-05 DIAGNOSIS — G4733 Obstructive sleep apnea (adult) (pediatric): Secondary | ICD-10-CM | POA: Diagnosis not present

## 2015-04-08 DIAGNOSIS — H26491 Other secondary cataract, right eye: Secondary | ICD-10-CM | POA: Diagnosis not present

## 2015-04-09 ENCOUNTER — Ambulatory Visit (INDEPENDENT_AMBULATORY_CARE_PROVIDER_SITE_OTHER): Payer: Medicare HMO | Admitting: *Deleted

## 2015-04-09 DIAGNOSIS — J309 Allergic rhinitis, unspecified: Secondary | ICD-10-CM

## 2015-04-10 ENCOUNTER — Ambulatory Visit: Payer: Medicare HMO

## 2015-04-17 ENCOUNTER — Ambulatory Visit: Payer: Medicare HMO

## 2015-04-17 ENCOUNTER — Ambulatory Visit (INDEPENDENT_AMBULATORY_CARE_PROVIDER_SITE_OTHER): Payer: Medicare HMO | Admitting: *Deleted

## 2015-04-17 DIAGNOSIS — J309 Allergic rhinitis, unspecified: Secondary | ICD-10-CM

## 2015-04-23 ENCOUNTER — Ambulatory Visit (INDEPENDENT_AMBULATORY_CARE_PROVIDER_SITE_OTHER): Payer: Medicare HMO | Admitting: *Deleted

## 2015-04-23 DIAGNOSIS — J309 Allergic rhinitis, unspecified: Secondary | ICD-10-CM | POA: Diagnosis not present

## 2015-04-24 ENCOUNTER — Ambulatory Visit: Payer: Medicare HMO

## 2015-04-30 ENCOUNTER — Ambulatory Visit (INDEPENDENT_AMBULATORY_CARE_PROVIDER_SITE_OTHER): Payer: Medicare HMO | Admitting: Gastroenterology

## 2015-04-30 ENCOUNTER — Ambulatory Visit (INDEPENDENT_AMBULATORY_CARE_PROVIDER_SITE_OTHER): Payer: Medicare HMO | Admitting: *Deleted

## 2015-04-30 ENCOUNTER — Telehealth: Payer: Self-pay

## 2015-04-30 ENCOUNTER — Other Ambulatory Visit: Payer: Self-pay

## 2015-04-30 ENCOUNTER — Encounter: Payer: Self-pay | Admitting: Gastroenterology

## 2015-04-30 VITALS — BP 140/90 | HR 66 | Ht 68.0 in | Wt 212.0 lb

## 2015-04-30 DIAGNOSIS — J309 Allergic rhinitis, unspecified: Secondary | ICD-10-CM

## 2015-04-30 DIAGNOSIS — R1013 Epigastric pain: Secondary | ICD-10-CM

## 2015-04-30 NOTE — Telephone Encounter (Signed)
Amy Hazelwood notified and Blue Ball as well with CT

## 2015-04-30 NOTE — Patient Instructions (Addendum)
You will be set up for a CT scan of abdomen and pelvis with IV and oral contrast for epigastric pains.  You have been scheduled for a CT scan of the abdomen and pelvis at [facility] ([address]---this is in the same building as [facility]).   You are scheduled on 05/03/15 at 930 am. You should arrive 15 minutes prior to your appointment time for registration. Please follow the written instructions below on the day of your exam:  WARNING: IF YOU ARE ALLERGIC TO IODINE/X-RAY DYE, PLEASE NOTIFY RADIOLOGY IMMEDIATELY AT [phone]! YOU WILL BE GIVEN A 13 HOUR PREMEDICATION PREP.  1) Do not eat or drink anything after 530 am (4 hours prior to your test) 2) You have been given 2 bottles of oral contrast to drink. The solution may taste better if refrigerated, but do NOT add ice or any other liquid to this solution. Shake well before drinking.    Drink 1 bottle of contrast @ 730 (2 hours prior to your exam)  Drink 1 bottle of contrast @ 830 am (1 hour prior to your exam)  You may take any medications as prescribed with a small amount of water except for the following: Metformin, Glucophage, Glucovance, Avandamet, Riomet, Fortamet, Actoplus Met, Janumet, Glumetza or Metaglip. The above medications must be held the day of the exam AND 48 hours after the exam.  The purpose of you drinking the oral contrast is to aid in the visualization of your intestinal tract. The contrast solution may cause some diarrhea. Before your exam is started, you will be given a small amount of fluid to drink. Depending on your individual set of symptoms, you may also receive an intravenous injection of x-ray contrast/dye. Plan on being at [facility] for 30 minutes or longer, depending on the type of exam you are having performed.  This test typically takes 30-45 minutes to complete.  If you have any questions regarding your exam or if you need to reschedule, you may call the CT department at  [phone] between the hours of 8:00 am and 5:00 pm, Monday-Friday.  ________________________________________________________________________  If that is not helpful, would consider upper endoscopy. 

## 2015-04-30 NOTE — Progress Notes (Signed)
Review of pertinent gastrointestinal problems: 1. Routine risk for colon cancer.  Colonoscoy 04/2010 for lower abd pains, mother with colon polyps was normal; she was recommended to begin routine risk screening at age 50.  HPI: This is a   very pleasant 50 year old woman who was referred to me by Abner Greenspan, MD  to evaluate  abdominal pain .    Chief complaint is abdominal pain   RLQ pain for 4 months ago, she thought appy.  Over the next day or two the pain changed to her upper abd.  Lasted overall about a week.  Any movement made the pain worse.  She went to her PCP Dr. Glori Bickers; [place] at that time wsa fairly normal.   Blood work at that time was normal including CBC, complete metabolic profile, amylase, lipase. The pain resolved but recurred 6 weeks ago.   Labs were again normal. The pain was more epigastric.  No associated nausea or vomiting.  Lasted 3-5 days.  No associated fevers or chills.  Eating was not related.  Was worse with rolling over, walking.  She has fibromyalgia; takes alleve, advil about twice per week.  She was given nexium and that may have helped.  Overall her weight has been gradually increasing.  No swallowing difficulties, no overt GI bleeding.   Review of systems: Pertinent positive and negative review of systems were noted in the above HPI section. Complete review of systems was performed and was otherwise normal.   Past Medical History  Diagnosis Date  . Allergy     allergic rhinitis  . Asthma   . Depression   . GERD (gastroesophageal reflux disease)   . Hypertension   . Fibromyalgia   . Degenerative disc disease   . Sleep apnea   . Fx of fibula 2009  . IBS (irritable bowel syndrome)   . Chronic headaches   . Hemorrhoids   . Iritis, chronic   . Macular hole of right eye     Past Surgical History  Procedure Laterality Date  . Laparoscopy  08/2004    endometriosis - several   . Breast surgery      left nipple removed - benign tumor  . Anterior  cruciate ligament repair  1996    small tear of ACL  . Bunionectomy    . Tonsillectomy    . Abdominal hysterectomy      Current Outpatient Prescriptions  Medication Sig Dispense Refill  . acetaminophen (TYLENOL) 500 MG tablet OTC as directed.     Marland Kitchen albuterol (PROAIR HFA) 108 (90 BASE) MCG/ACT inhaler INHALE 2 PUFFS EVERY 4 HOURS AS NEEDED FOR WHEEZING 1 Inhaler 11  . buPROPion (WELLBUTRIN XL) 300 MG 24 hr tablet Take 1 tablet (300 mg total) by mouth daily. 90 tablet 3  . carvedilol (COREG) 6.25 MG tablet Take 1 tablet (6.25 mg total) by mouth 2 (two) times daily with a meal. 180 tablet 1  . diphenhydrAMINE (BENADRYL) 25 mg capsule OTC as directed.     Marland Kitchen esomeprazole (NEXIUM) 20 MG capsule Take 1 capsule (20 mg total) by mouth daily at 12 noon. 30 capsule 11  . Fluticasone-Salmeterol (ADVAIR DISKUS) 100-50 MCG/DOSE AEPB Inhale 1 puff then rinse mouth, twice daily maintenance 60 each prn  . folic acid (FOLVITE) 1 MG tablet Take 2 mg by mouth daily.     Marland Kitchen ibuprofen (ADVIL,MOTRIN) 200 MG tablet Take 200 mg by mouth every 6 (six) hours as needed.    Marland Kitchen leucovorin (WELLCOVORIN) 5 MG  tablet Take 5 mg by mouth 2 (two) times a week.    . lidocaine (XYLOCAINE) 2 % solution Apply 1 application topically every 2 (two) hours as needed. To sores in mouth - Topical 20 mL 2  . losartan (COZAAR) 50 MG tablet Take 1 tablet (50 mg total) by mouth daily. 90 tablet 3  . methocarbamol (ROBAXIN) 500 MG tablet TAKE 1 TABLET (500 MG TOTAL) BY MOUTH AT BEDTIME AS NEEDED. 30 tablet 3  . methotrexate (RHEUMATREX) 2.5 MG tablet Take 25 mg by mouth once a week. Caution:Chemotherapy. Protect from light.    . montelukast (SINGULAIR) 10 MG tablet Take 1 tablet (10 mg total) by mouth at bedtime. 90 tablet 3  . NONFORMULARY OR COMPOUNDED ITEM Allergy Vaccine 1:50 Given at [facility]    . prednisoLONE acetate (PRED FORTE) 1 % ophthalmic suspension Place 1 drop into both eyes daily.     . traMADol (ULTRAM) 50 MG tablet  TAKE 1 TABLET (50 MG TOTAL) BY MOUTH EVERY 8 (EIGHT) HOURS AS NEEDED FOR PAIN. 30 tablet 3   No current facility-administered medications for this visit.    Allergies as of 04/30/2015 - Review Complete 04/30/2015  Allergen Reaction Noted  . Cetirizine hcl  10/03/2007  . Erythromycin  09/29/2006  . Escitalopram oxalate  09/01/2007  . Lisinopril  02/13/2008  . Loratadine  10/03/2007  . Nifedipine  09/01/2007  . Sertraline hcl  09/01/2007  . Vyvanse [lisdexamfetamine dimesylate]  12/17/2010    Family History  Problem Relation Age of Onset  . Hypertension Mother   . Thyroid disease Mother   . Fibromyalgia Mother   . Diabetes Father   . Kidney cancer Maternal Grandmother   . Colon cancer Neg Hx   . Colon polyps Mother     Social History   Social History  . Marital Status: Married    Spouse Name: N/A  . Number of Children: 1  . Years of Education: N/A   Occupational History  . Disabled due to fibromyalgia    Social History Main Topics  . Smoking status: Never Smoker   . Smokeless tobacco: Never Used  . Alcohol Use: No  . Drug Use: No  . Sexual Activity: No   Other Topics Concern  . Not on file   Social History Narrative   1 caffeine drink daily      Physical Exam: BP 140/90 mmHg  Pulse 66  Ht 5\' 8"  (1.727 m)  Wt 212 lb (96.163 kg)  BMI 32.24 kg/m2  LMP 08/05/2006 Constitutional: generally well-appearing Psychiatric: alert and oriented x3 Eyes: extraocular movements intact Mouth: oral pharynx moist, no lesions Neck: supple no lymphadenopathy Cardiovascular: heart regular rate and rhythm Lungs: clear to auscultation bilaterally Abdomen: soft, nontender, nondistended, no obvious ascites, no peritoneal signs, normal bowel sounds Extremities: no lower extremity edema bilaterally Skin: no lesions on visible extremities   Assessment and plan: 50 y.o. female with  Epigastric abdominal pain  She has no associated nausea vomiting and eating does not tend to  cause the pain to come on. It is not clear that her pains are GI related given that and the fact that it seems fairly positional in nature. Blood work is all been normal. I recommended CT scan abdomen and pelvis and she understands that if this is normal then we could proceed with EGD however given the fact that she has no other associated GI symptoms I think he would probably be low yield.   Owens Loffler, MD [facility] 04/30/2015, 10:17 AM  Cc: Tower, Wynelle Fanny, MD

## 2015-04-30 NOTE — Telephone Encounter (Signed)
-----   Message from Milus Banister, MD sent at 04/30/2015  3:10 PM EDT ----- That is OK, thanks  ----- Message -----    From: Barron Alvine, RN    Sent: 04/30/2015   3:05 PM      To: Milus Banister, MD    ----- Message -----    From: Darden Dates    Sent: 04/30/2015   2:40 PM      To: Barron Alvine, RN  Per Carleene Overlie, they will not cover the pelvic part of CT abd/pelvis.  They will approve CT abd with contrast only.  Is that ok? Pt is scheduled tomorrow. lemme know, Thanks Amy

## 2015-05-01 ENCOUNTER — Ambulatory Visit: Payer: Medicare HMO

## 2015-05-01 ENCOUNTER — Ambulatory Visit (INDEPENDENT_AMBULATORY_CARE_PROVIDER_SITE_OTHER)
Admission: RE | Admit: 2015-05-01 | Discharge: 2015-05-01 | Disposition: A | Discharge status: Home / Self Care | Payer: Medicare HMO | Source: Ambulatory Visit | Attending: Gastroenterology | Admitting: Gastroenterology

## 2015-05-01 DIAGNOSIS — K760 Fatty (change of) liver, not elsewhere classified: Secondary | ICD-10-CM | POA: Diagnosis not present

## 2015-05-01 DIAGNOSIS — R1013 Epigastric pain: Secondary | ICD-10-CM | POA: Diagnosis not present

## 2015-05-01 MED ORDER — IOPAMIDOL (ISOVUE-300) INJECTION 61%
80.0000 mL | Freq: Once | INTRAVENOUS | Status: AC | PRN
  Administered 2015-05-01: 80 mL via INTRAVENOUS

## 2015-05-03 ENCOUNTER — Other Ambulatory Visit: Payer: Medicare HMO

## 2015-05-05 ENCOUNTER — Other Ambulatory Visit: Payer: Self-pay | Admitting: Family Medicine

## 2015-05-09 ENCOUNTER — Ambulatory Visit (INDEPENDENT_AMBULATORY_CARE_PROVIDER_SITE_OTHER): Payer: Medicare HMO

## 2015-05-09 DIAGNOSIS — J309 Allergic rhinitis, unspecified: Secondary | ICD-10-CM

## 2015-05-15 ENCOUNTER — Ambulatory Visit (INDEPENDENT_AMBULATORY_CARE_PROVIDER_SITE_OTHER): Payer: Medicare HMO | Admitting: *Deleted

## 2015-05-15 DIAGNOSIS — J309 Allergic rhinitis, unspecified: Secondary | ICD-10-CM | POA: Diagnosis not present

## 2015-05-20 ENCOUNTER — Ambulatory Visit (INDEPENDENT_AMBULATORY_CARE_PROVIDER_SITE_OTHER): Payer: Medicare HMO | Admitting: *Deleted

## 2015-05-20 DIAGNOSIS — J309 Allergic rhinitis, unspecified: Secondary | ICD-10-CM

## 2015-05-23 ENCOUNTER — Encounter: Payer: Self-pay | Admitting: Internal Medicine

## 2015-05-24 DIAGNOSIS — H35353 Cystoid macular degeneration, bilateral: Secondary | ICD-10-CM | POA: Diagnosis not present

## 2015-05-24 DIAGNOSIS — Z961 Presence of intraocular lens: Secondary | ICD-10-CM | POA: Diagnosis not present

## 2015-05-24 DIAGNOSIS — H3581 Retinal edema: Secondary | ICD-10-CM | POA: Diagnosis not present

## 2015-05-24 DIAGNOSIS — H53149 Visual discomfort, unspecified: Secondary | ICD-10-CM | POA: Diagnosis not present

## 2015-05-24 DIAGNOSIS — H20023 Recurrent acute iridocyclitis, bilateral: Secondary | ICD-10-CM | POA: Diagnosis not present

## 2015-05-24 DIAGNOSIS — Z79899 Other long term (current) drug therapy: Secondary | ICD-10-CM | POA: Diagnosis not present

## 2015-05-24 DIAGNOSIS — H20029 Recurrent acute iridocyclitis, unspecified eye: Secondary | ICD-10-CM | POA: Diagnosis not present

## 2015-06-04 ENCOUNTER — Ambulatory Visit (INDEPENDENT_AMBULATORY_CARE_PROVIDER_SITE_OTHER): Payer: Medicare HMO

## 2015-06-04 DIAGNOSIS — J309 Allergic rhinitis, unspecified: Secondary | ICD-10-CM | POA: Diagnosis not present

## 2015-06-17 DIAGNOSIS — H26492 Other secondary cataract, left eye: Secondary | ICD-10-CM | POA: Diagnosis not present

## 2015-06-18 ENCOUNTER — Telehealth: Payer: Self-pay | Admitting: Internal Medicine

## 2015-06-18 ENCOUNTER — Ambulatory Visit (INDEPENDENT_AMBULATORY_CARE_PROVIDER_SITE_OTHER): Payer: Medicare HMO | Admitting: *Deleted

## 2015-06-18 DIAGNOSIS — J309 Allergic rhinitis, unspecified: Secondary | ICD-10-CM | POA: Diagnosis not present

## 2015-06-18 NOTE — Telephone Encounter (Signed)
Spoke with pt. She is aware of CY's response. Nothing further was needed. 

## 2015-06-18 NOTE — Telephone Encounter (Signed)
I don't think Humira and allergy shots will interfere with each other

## 2015-06-18 NOTE — Telephone Encounter (Signed)
LMTCB

## 2015-06-18 NOTE — Telephone Encounter (Signed)
Spoke with [name]  She states that she will be starting on Humira and she is calling to make sure this will not interfere with her allergy shots  Please advise thanks!

## 2015-06-19 ENCOUNTER — Encounter: Payer: Self-pay | Admitting: Family Medicine

## 2015-06-24 ENCOUNTER — Ambulatory Visit (INDEPENDENT_AMBULATORY_CARE_PROVIDER_SITE_OTHER): Payer: Medicare HMO

## 2015-06-24 DIAGNOSIS — J309 Allergic rhinitis, unspecified: Secondary | ICD-10-CM

## 2015-07-01 ENCOUNTER — Ambulatory Visit (INDEPENDENT_AMBULATORY_CARE_PROVIDER_SITE_OTHER): Payer: Medicare HMO | Admitting: *Deleted

## 2015-07-01 DIAGNOSIS — J309 Allergic rhinitis, unspecified: Secondary | ICD-10-CM | POA: Diagnosis not present

## 2015-07-08 ENCOUNTER — Ambulatory Visit (INDEPENDENT_AMBULATORY_CARE_PROVIDER_SITE_OTHER): Payer: Medicare HMO | Admitting: *Deleted

## 2015-07-08 DIAGNOSIS — J309 Allergic rhinitis, unspecified: Secondary | ICD-10-CM

## 2015-07-15 ENCOUNTER — Ambulatory Visit (INDEPENDENT_AMBULATORY_CARE_PROVIDER_SITE_OTHER): Payer: Medicare HMO

## 2015-07-15 DIAGNOSIS — J309 Allergic rhinitis, unspecified: Secondary | ICD-10-CM

## 2015-07-24 ENCOUNTER — Ambulatory Visit (INDEPENDENT_AMBULATORY_CARE_PROVIDER_SITE_OTHER): Payer: Medicare HMO | Admitting: *Deleted

## 2015-07-24 DIAGNOSIS — J309 Allergic rhinitis, unspecified: Secondary | ICD-10-CM | POA: Diagnosis not present

## 2015-07-29 ENCOUNTER — Telehealth: Payer: Self-pay | Admitting: Internal Medicine

## 2015-07-29 DIAGNOSIS — G4733 Obstructive sleep apnea (adult) (pediatric): Secondary | ICD-10-CM

## 2015-07-29 NOTE — Telephone Encounter (Signed)
LVM for pt to return call

## 2015-07-30 ENCOUNTER — Ambulatory Visit (INDEPENDENT_AMBULATORY_CARE_PROVIDER_SITE_OTHER): Payer: Medicare HMO | Admitting: *Deleted

## 2015-07-30 DIAGNOSIS — J309 Allergic rhinitis, unspecified: Secondary | ICD-10-CM | POA: Diagnosis not present

## 2015-07-30 NOTE — Telephone Encounter (Signed)
Called spoke with pt. Informed her of CY's recs. She voiced understadning and had no further questions. Order has been placed. Nothing further needed.

## 2015-07-30 NOTE — Telephone Encounter (Signed)
Ok to order DME- continue CPAP current pressure, mask of choice, humidifier, supplies including headgear, filters, tubing as appropriate for her machine, AirView    Dx OSA

## 2015-07-30 NOTE — Telephone Encounter (Signed)
Called and spoke with pt and she stated that [facility] needs a new rx sent in to them since her rx expired in march.  She stated that they told her that this will be for her to get the cpap supplies, but the rx must state each piece of supply that she needs or insurance will not cover it.  CY please advise. Thanks  Last ov  12/14/14 Next ov-12/16/15

## 2015-08-06 ENCOUNTER — Ambulatory Visit (INDEPENDENT_AMBULATORY_CARE_PROVIDER_SITE_OTHER): Payer: Medicare HMO | Admitting: *Deleted

## 2015-08-06 DIAGNOSIS — J309 Allergic rhinitis, unspecified: Secondary | ICD-10-CM | POA: Diagnosis not present

## 2015-08-07 ENCOUNTER — Telehealth: Payer: Self-pay | Admitting: Internal Medicine

## 2015-08-07 DIAGNOSIS — J309 Allergic rhinitis, unspecified: Secondary | ICD-10-CM | POA: Diagnosis not present

## 2015-08-07 DIAGNOSIS — G4733 Obstructive sleep apnea (adult) (pediatric): Secondary | ICD-10-CM | POA: Diagnosis not present

## 2015-08-07 NOTE — Telephone Encounter (Signed)
Allergy Serum Extract Date Mixed: 08/07/15 Vial: 2 Strength: 1:50 Here/Mail/Pick Up: here Mixed By: tbs Last OV: 02/13/14 Pending OV: 12/16/15

## 2015-08-09 DIAGNOSIS — M797 Fibromyalgia: Secondary | ICD-10-CM | POA: Diagnosis not present

## 2015-08-09 DIAGNOSIS — Z79899 Other long term (current) drug therapy: Secondary | ICD-10-CM | POA: Diagnosis not present

## 2015-08-09 DIAGNOSIS — H20023 Recurrent acute iridocyclitis, bilateral: Secondary | ICD-10-CM | POA: Diagnosis not present

## 2015-08-09 DIAGNOSIS — H20029 Recurrent acute iridocyclitis, unspecified eye: Secondary | ICD-10-CM | POA: Diagnosis not present

## 2015-08-09 DIAGNOSIS — Z5181 Encounter for therapeutic drug level monitoring: Secondary | ICD-10-CM | POA: Diagnosis not present

## 2015-08-09 DIAGNOSIS — H35353 Cystoid macular degeneration, bilateral: Secondary | ICD-10-CM | POA: Diagnosis not present

## 2015-08-13 ENCOUNTER — Ambulatory Visit (INDEPENDENT_AMBULATORY_CARE_PROVIDER_SITE_OTHER): Payer: Medicare HMO | Admitting: *Deleted

## 2015-08-13 DIAGNOSIS — J309 Allergic rhinitis, unspecified: Secondary | ICD-10-CM | POA: Diagnosis not present

## 2015-08-28 ENCOUNTER — Ambulatory Visit (INDEPENDENT_AMBULATORY_CARE_PROVIDER_SITE_OTHER): Payer: Medicare HMO | Admitting: *Deleted

## 2015-08-28 DIAGNOSIS — J309 Allergic rhinitis, unspecified: Secondary | ICD-10-CM

## 2015-09-12 ENCOUNTER — Ambulatory Visit (INDEPENDENT_AMBULATORY_CARE_PROVIDER_SITE_OTHER): Payer: Medicare HMO | Admitting: *Deleted

## 2015-09-12 ENCOUNTER — Ambulatory Visit (INDEPENDENT_AMBULATORY_CARE_PROVIDER_SITE_OTHER): Payer: Medicare HMO

## 2015-09-12 DIAGNOSIS — J309 Allergic rhinitis, unspecified: Secondary | ICD-10-CM | POA: Diagnosis not present

## 2015-09-12 DIAGNOSIS — Z23 Encounter for immunization: Secondary | ICD-10-CM

## 2015-09-25 ENCOUNTER — Ambulatory Visit (INDEPENDENT_AMBULATORY_CARE_PROVIDER_SITE_OTHER): Payer: Medicare HMO | Admitting: *Deleted

## 2015-09-25 DIAGNOSIS — J309 Allergic rhinitis, unspecified: Secondary | ICD-10-CM | POA: Diagnosis not present

## 2015-10-10 ENCOUNTER — Ambulatory Visit (INDEPENDENT_AMBULATORY_CARE_PROVIDER_SITE_OTHER): Payer: Medicare HMO | Admitting: Podiatry

## 2015-10-10 ENCOUNTER — Telehealth: Payer: Self-pay | Admitting: *Deleted

## 2015-10-10 ENCOUNTER — Ambulatory Visit (INDEPENDENT_AMBULATORY_CARE_PROVIDER_SITE_OTHER): Payer: Medicare HMO

## 2015-10-10 ENCOUNTER — Encounter: Payer: Self-pay | Admitting: Podiatry

## 2015-10-10 VITALS — BP 147/89 | HR 72 | Resp 16 | Ht 68.0 in | Wt 200.0 lb

## 2015-10-10 DIAGNOSIS — M779 Enthesopathy, unspecified: Secondary | ICD-10-CM

## 2015-10-10 DIAGNOSIS — M79671 Pain in right foot: Secondary | ICD-10-CM

## 2015-10-10 MED ORDER — TRIAMCINOLONE ACETONIDE 10 MG/ML IJ SUSP
10.0000 mg | Freq: Once | INTRAMUSCULAR | Status: AC
  Administered 2015-10-10: 10 mg

## 2015-10-10 NOTE — Telephone Encounter (Addendum)
Pt states Dr. Paulla Dolly was to prescribe a medication but it is not at [facility]. 10/11/2015-Left message informing pt the Mobic had been called to [facility] in [place].

## 2015-10-10 NOTE — Progress Notes (Signed)
   Subjective:    Patient ID: [name], female    DOB: 02-20-1965, 50 y.o.   MRN: [id number]  HPI Chief Complaint  Patient presents with  . Foot Pain    Right foot; dorsal (towards lateral side); pt stated, "Hurts to touch and walk on; feels itchy; pain gets worse as the day goes on"; Since Monday, October 07, 2015      Review of Systems  Hematological: Bruises/bleeds easily.  All other systems reviewed and are negative.      Objective:   Physical Exam        Assessment & Plan:

## 2015-10-11 MED ORDER — MELOXICAM 15 MG PO TABS: 15.0000 mg | ORAL_TABLET | Freq: Every day | ORAL | 0 refills | Status: DC

## 2015-10-11 NOTE — Progress Notes (Signed)
Subjective:     Patient ID: [name], female   DOB: April 22, 1965, 50 y.o.   MRN: [id number]  HPI patient presents stating she's getting pain on the upper outside of her right foot and does not remember specific injury that went with   Review of Systems  All other systems reviewed and are negative.      Objective:   Physical Exam  Constitutional: She is oriented to person, place, and time.  Cardiovascular: Intact distal pulses.   Musculoskeletal: Normal range of motion.  Neurological: She is oriented to person, place, and time.  Skin: Skin is warm.  Nursing note and vitals reviewed.  neurovascular status intact muscle strength adequate range of motion within normal limits with patient found to have inflammatory changes dorsal lateral aspect of foot with inflammation fluid noted but no indication of tendon dysfunction. Patient's found have good digital perfusion with well oriented 3 with no equinus condition and did have structural bunion deformity fixed in the past by me     Assessment:     Dorsal lateral tendinitis with inflammation noted and perineal irritation    Plan:     H&P x-rays reviewed and careful sheath injection administered 3 mg Kenalog 5 mill grams Xylocaine along with heat ice therapy and supportive shoe. Reappoint to recheck  X-ray taken indicates pin is in place with no indications currently of stress fracture or arthritic condition

## 2015-10-11 NOTE — Telephone Encounter (Signed)
mobic 15mg .

## 2015-10-14 ENCOUNTER — Ambulatory Visit (INDEPENDENT_AMBULATORY_CARE_PROVIDER_SITE_OTHER): Payer: Medicare HMO | Admitting: *Deleted

## 2015-10-14 ENCOUNTER — Other Ambulatory Visit: Payer: Self-pay | Admitting: Internal Medicine

## 2015-10-14 DIAGNOSIS — J309 Allergic rhinitis, unspecified: Secondary | ICD-10-CM | POA: Diagnosis not present

## 2015-10-24 ENCOUNTER — Ambulatory Visit (INDEPENDENT_AMBULATORY_CARE_PROVIDER_SITE_OTHER): Payer: Medicare HMO | Admitting: *Deleted

## 2015-10-24 ENCOUNTER — Ambulatory Visit (INDEPENDENT_AMBULATORY_CARE_PROVIDER_SITE_OTHER): Payer: Medicare HMO | Admitting: Podiatry

## 2015-10-24 DIAGNOSIS — M779 Enthesopathy, unspecified: Secondary | ICD-10-CM

## 2015-10-24 DIAGNOSIS — J309 Allergic rhinitis, unspecified: Secondary | ICD-10-CM

## 2015-10-24 NOTE — Progress Notes (Signed)
Subjective:     Patient ID: [name], female   DOB: 10/22/1965, 50 y.o.   MRN: [id number]  HPI patient states my right foot is doing well with minimal discomfort   Review of Systems     Objective:   Physical Exam Neurovascular status intact muscle strength adequate with discomfort of an acceptable level right lateral foot with mild swelling still noted    Assessment:     Improve tendinitis right    Plan:     Advised on physical therapy anti-inflammatory ice therapy and shoe gear modifications. Patient be seen back to recheck

## 2015-11-15 ENCOUNTER — Ambulatory Visit (INDEPENDENT_AMBULATORY_CARE_PROVIDER_SITE_OTHER): Payer: Medicare HMO | Admitting: *Deleted

## 2015-11-15 DIAGNOSIS — J309 Allergic rhinitis, unspecified: Secondary | ICD-10-CM

## 2015-11-21 ENCOUNTER — Encounter: Payer: Self-pay | Admitting: Family Medicine

## 2015-11-21 ENCOUNTER — Ambulatory Visit (INDEPENDENT_AMBULATORY_CARE_PROVIDER_SITE_OTHER): Payer: Medicare HMO | Admitting: *Deleted

## 2015-11-21 DIAGNOSIS — J309 Allergic rhinitis, unspecified: Secondary | ICD-10-CM

## 2015-11-22 DIAGNOSIS — Z79899 Other long term (current) drug therapy: Secondary | ICD-10-CM | POA: Diagnosis not present

## 2015-11-22 DIAGNOSIS — H20023 Recurrent acute iridocyclitis, bilateral: Secondary | ICD-10-CM | POA: Diagnosis not present

## 2015-11-22 DIAGNOSIS — Z961 Presence of intraocular lens: Secondary | ICD-10-CM | POA: Diagnosis not present

## 2015-11-22 DIAGNOSIS — H20029 Recurrent acute iridocyclitis, unspecified eye: Secondary | ICD-10-CM | POA: Diagnosis not present

## 2015-11-22 DIAGNOSIS — H35353 Cystoid macular degeneration, bilateral: Secondary | ICD-10-CM | POA: Diagnosis not present

## 2015-11-25 ENCOUNTER — Ambulatory Visit (INDEPENDENT_AMBULATORY_CARE_PROVIDER_SITE_OTHER): Payer: Medicare HMO

## 2015-11-25 DIAGNOSIS — J309 Allergic rhinitis, unspecified: Secondary | ICD-10-CM

## 2015-12-02 ENCOUNTER — Encounter: Payer: Self-pay | Admitting: Internal Medicine

## 2015-12-02 ENCOUNTER — Ambulatory Visit (INDEPENDENT_AMBULATORY_CARE_PROVIDER_SITE_OTHER): Payer: Medicare HMO | Admitting: *Deleted

## 2015-12-02 DIAGNOSIS — J309 Allergic rhinitis, unspecified: Secondary | ICD-10-CM

## 2015-12-04 ENCOUNTER — Ambulatory Visit: Payer: Medicare HMO | Admitting: Family Medicine

## 2015-12-09 ENCOUNTER — Ambulatory Visit (INDEPENDENT_AMBULATORY_CARE_PROVIDER_SITE_OTHER): Payer: Medicare HMO | Admitting: Family Medicine

## 2015-12-09 ENCOUNTER — Encounter: Payer: Self-pay | Admitting: Family Medicine

## 2015-12-09 VITALS — BP 104/70 | HR 87 | Temp 98.7°F | Ht 68.0 in | Wt 217.0 lb

## 2015-12-09 DIAGNOSIS — M545 Low back pain, unspecified: Secondary | ICD-10-CM | POA: Insufficient documentation

## 2015-12-09 DIAGNOSIS — G8929 Other chronic pain: Secondary | ICD-10-CM | POA: Diagnosis not present

## 2015-12-09 DIAGNOSIS — M51379 Other intervertebral disc degeneration, lumbosacral region without mention of lumbar back pain or lower extremity pain: Secondary | ICD-10-CM | POA: Insufficient documentation

## 2015-12-09 DIAGNOSIS — M5137 Other intervertebral disc degeneration, lumbosacral region: Secondary | ICD-10-CM | POA: Diagnosis not present

## 2015-12-09 NOTE — Assessment & Plan Note (Signed)
From a combination of mild scoliosis/ deg disc and jt dz and also fibromyalgia TENS unit has been most helpful-hers is worn out after 10 y and new px given for use bid prn  Also disc routine stretching and taught some lumbar and piriformis and core stretches today  Would benefit from water exercise if she could tolerate it

## 2015-12-09 NOTE — Progress Notes (Signed)
Subjective:    Patient ID: [name], female    DOB: Jul 19, 1965, 50 y.o.   MRN: [id number]  HPI Here to discuss treatment for back pain   Doing ok  Has chronic back problems  Has an old TENS unit - for about 10 years  Cannot afford it out of pocket  Has had PT Has had back injections (3 injections) - pain relief only for a day   Sciatic nerve bothers her occ- primarily on the right side  Sends pain 1/2 way down leg - and posterior/ (not all the way to the foot)  No n/t or weakness at all   Tries not to lift anything- cannot lift more than 10 lb  She does not do any exercises or stretches Not in an exercise program-fibromyalgia limits her   Not taking meloxicam  occ advil or aleve  Has methocarbamol and tramadol- does not use often/only when pain is worse  Still on methotrexate weekly  Also magnesium and D3  Uses it on her lower back primarily  Has hx of deg disc and facet jt dz in her back  A neuro surgeon told her surgery may not help    Wt Readings from Last 3 Encounters:  12/09/15 217 lb (98.4 kg)  10/10/15 200 lb (90.7 kg)  04/30/15 212 lb (96.2 kg)  not eating healthy or watching diet/ weight is up  Also has a hx of fibromyalgia    Last xr of LS  Study Result   *RADIOLOGY REPORT*  Clinical Data: Acute chronic back pain.  LUMBAR SPINE - COMPLETE 4+ VIEW  Comparison: 11/02/2011.  Findings: Mild dextroscoliosis.  Normal vertebral body alignment and height without evidence of trauma or bone destruction.  Mild this space narrowing throughout with small marginal osteophytes. Mild facet joint disease in the lower three levels.  Normal sacroiliac joints.  No change from prior study.  IMPRESSION: No acute findings.  Mild degenerative changes   Original Report Authenticated By: Vallery Ridge, M.D.    She also had MRI of CS in 04- had bone spurs and deg disc changes   Patient Active Problem List   Diagnosis Date Noted  . Chronic low  back pain 12/09/2015  . Degeneration of lumbar or lumbosacral intervertebral disc 12/09/2015  . Abdominal pain, right upper quadrant 01/08/2015  . Right foot pain 04/16/2014  . Elevated TSH 01/24/2014  . Knee pain, right 10/01/2013  . Nasal septal deviation 01/03/2013  . Encounter for Medicare annual wellness exam 11/02/2012  . Hydradenitis 11/02/2012  . Hyperglycemia 03/23/2012  . Macular hole 03/23/2012  . Screening-pulmonary TB 03/23/2012  . Routine general medical examination at a health care facility 10/02/2011  . Iritis, recurrent 10/02/2011  . Tachycardia 10/02/2011  . Other screening mammogram 10/02/2011  . ADD (attention deficit disorder) 08/20/2010  . DYSHIDROTIC ECZEMA, HANDS 11/12/2009  . Obstructive sleep apnea 11/19/2006  . DEPRESSION 09/29/2006  . Essential hypertension 09/29/2006  . Seasonal and perennial allergic rhinitis 09/29/2006  . Asthma, mild intermittent, well-controlled 09/29/2006  . GERD 09/29/2006  . IBS 09/29/2006  . ENDOMETRIOSIS 09/29/2006  . FEMALE INFERTILITY 09/29/2006  . FIBROMYALGIA 09/29/2006  . INSOMNIA 09/29/2006   Past Medical History:  Diagnosis Date  . Allergy    allergic rhinitis  . Asthma   . Chronic headaches   . Degenerative disc disease   . Depression   . Fibromyalgia   . Fx of fibula 2009  . GERD (gastroesophageal reflux disease)   . Hemorrhoids   .  Hypertension   . IBS (irritable bowel syndrome)   . Iritis, chronic   . Macular hole of right eye   . Sleep apnea    Past Surgical History:  Procedure Laterality Date  . ABDOMINAL HYSTERECTOMY    . ANTERIOR CRUCIATE LIGAMENT REPAIR  1996   small tear of ACL  . BREAST SURGERY     left nipple removed - benign tumor  . BUNIONECTOMY    . LAPAROSCOPY  08/2004   endometriosis - several   . TONSILLECTOMY     Social History  Substance Use Topics  . Smoking status: Never Smoker  . Smokeless tobacco: Never Used  . Alcohol use No   Family History  Problem Relation Age  of Onset  . Hypertension Mother   . Thyroid disease Mother   . Fibromyalgia Mother   . Colon polyps Mother   . Diabetes Father   . Kidney cancer Maternal Grandmother   . Colon cancer Neg Hx    Allergies  Allergen Reactions  . Cetirizine Hcl     REACTION: not effective  . Erythromycin     REACTION: stomach hurts  . Escitalopram Oxalate     REACTION: no improvement  . Lisinopril     REACTION: cough  . Loratadine     REACTION: not effective  . Nifedipine     REACTION: fatigued and tremor  . Sertraline Hcl     REACTION: no help  . Vyvanse [Lisdexamfetamine Dimesylate]     Headache and excitablility   Current Outpatient Prescriptions on File Prior to Visit  Medication Sig Dispense Refill  . acetaminophen (TYLENOL) 500 MG tablet OTC as directed.     Marland Kitchen buPROPion (WELLBUTRIN XL) 300 MG 24 hr tablet Take 1 tablet (300 mg total) by mouth daily. 90 tablet 3  . carvedilol (COREG) 6.25 MG tablet Take 1 tablet (6.25 mg total) by mouth 2 (two) times daily with a meal. 180 tablet 1  . diphenhydrAMINE (BENADRYL) 25 mg capsule Take 25 mg by mouth as needed. OTC as directed.     Marland Kitchen esomeprazole (NEXIUM) 20 MG capsule Take 1 capsule (20 mg total) by mouth daily at 12 noon. 30 capsule 11  . Fluticasone-Salmeterol (ADVAIR DISKUS) 100-50 MCG/DOSE AEPB Inhale 1 puff then rinse mouth, twice daily maintenance 60 each prn  . folic acid (FOLVITE) 1 MG tablet Take 2 mg by mouth daily.     Marland Kitchen ibuprofen (ADVIL,MOTRIN) 200 MG tablet Take 200 mg by mouth every 6 (six) hours as needed.    Marland Kitchen leucovorin (WELLCOVORIN) 5 MG tablet Take 5 mg by mouth 2 (two) times a week.    . lidocaine (XYLOCAINE) 2 % solution Apply 1 application topically every 2 (two) hours as needed. To sores in mouth - Topical 20 mL 2  . losartan (COZAAR) 50 MG tablet Take 1 tablet (50 mg total) by mouth daily. 90 tablet 3  . methocarbamol (ROBAXIN) 500 MG tablet TAKE 1 TABLET (500 MG TOTAL) BY MOUTH AT BEDTIME AS NEEDED. 30 tablet 3  .  methotrexate (RHEUMATREX) 2.5 MG tablet Take 25 mg by mouth once a week. Caution:Chemotherapy. Protect from light.    . montelukast (SINGULAIR) 10 MG tablet Take 1 tablet (10 mg total) by mouth at bedtime. 90 tablet 3  . NONFORMULARY OR COMPOUNDED ITEM Allergy Vaccine 1:50 Given at [facility]    . prednisoLONE acetate (PRED FORTE) 1 % ophthalmic suspension Place 1 drop into both eyes daily.     Marland Kitchen PROAIR HFA  108 (90 Base) MCG/ACT inhaler INHALE TWO PUFFS BY MOUTH EVERY 4 HOURS AS NEEDED FOR WHEEZING 9 each 11  . traMADol (ULTRAM) 50 MG tablet TAKE 1 TABLET (50 MG TOTAL) BY MOUTH EVERY 8 (EIGHT) HOURS AS NEEDED FOR PAIN. 30 tablet 3   No current facility-administered medications on file prior to visit.     Review of Systems Review of Systems  Constitutional: Negative for fever, appetite change, fatigue and unexpected weight change.  Eyes: Negative for pain and visual disturbance.  Respiratory: Negative for cough and shortness of breath.   Cardiovascular: Negative for cp or palpitations    Gastrointestinal: Negative for nausea, diarrhea and constipation.  Genitourinary: Negative for urgency and frequency.  Skin: Negative for pallor or rash   MSK pos for myofasical pain and sensitivity, pos for chronic low back pain that sometimes rad to R upper leg  Neurological: Negative for weakness, light-headedness, numbness and pos for occ headaches.  Hematological: Negative for adenopathy. Does not bruise/bleed easily.  Psychiatric/Behavioral: Negative for dysphoric mood. The patient is not nervous/anxious.         Objective:   Physical Exam  Constitutional: She appears well-developed and well-nourished. No distress.  obese and well appearing   HENT:  Head: Normocephalic and atraumatic.  Eyes: Conjunctivae and EOM are normal. Pupils are equal, round, and reactive to light. No scleral icterus.  Neck: Normal range of motion. Neck supple.  Cardiovascular: Normal rate and regular rhythm.     Pulmonary/Chest: Effort normal and breath sounds normal. She has no wheezes. She has no rales.  Abdominal: Soft. Bowel sounds are normal. She exhibits no distension. There is no tenderness.  Musculoskeletal: She exhibits tenderness.       Right shoulder: She exhibits decreased range of motion and tenderness. She exhibits no bony tenderness, no effusion and no crepitus.       Lumbar back: She exhibits decreased range of motion, tenderness and spasm. She exhibits no bony tenderness and no edema.  Mild tenderness over lumbar musculature bilaterally  No bony tenderness or step off  No piriformis tenderness Neg slr for leg pain (pos for back pain)  Tight IT band on R side  Nl rom hips Nl gait   Flex 30 deg and ext 10 deg  Nl lat bend  No neuro changes   Lymphadenopathy:    She has no cervical adenopathy.  Neurological: She is alert. She has normal strength and normal reflexes. She displays no atrophy. No cranial nerve deficit or sensory deficit. She exhibits normal muscle tone. Coordination normal.  Negative SLR  Skin: Skin is warm and dry. No rash noted. No erythema. No pallor.  Psychiatric: She has a normal mood and affect.          Assessment & Plan:   Problem List Items Addressed This Visit      Musculoskeletal and Integument   Degeneration of lumbar or lumbosacral intervertebral disc    Ongoing- pt occ uses tramadol/ nsaid or muscle relaxer  Tens unit has been most helpful- her current one has worn out  Px given for tens unit- use as directed up to bid prn  Reassuring exam         Other   Chronic low back pain    From a combination of mild scoliosis/ deg disc and jt dz and also fibromyalgia TENS unit has been most helpful-hers is worn out after 10 y and new px given for use bid prn  Also disc routine stretching and  taught some lumbar and piriformis and core stretches today  Would benefit from water exercise if she could tolerate it

## 2015-12-09 NOTE — Patient Instructions (Signed)
I think a TENS unit is a good idea for back pain  Use it appropriately and do not use over bony areas  Update [place] if any problems  Also - work on some stretches for flexibility   Take care of yourself  Work on better diet as well

## 2015-12-09 NOTE — Progress Notes (Signed)
Pre visit review using our clinic review tool, if applicable. No additional management support is needed unless otherwise documented below in the visit note. 

## 2015-12-09 NOTE — Assessment & Plan Note (Signed)
Ongoing- pt occ uses tramadol/ nsaid or muscle relaxer  Tens unit has been most helpful- her current one has worn out  Px given for tens unit- use as directed up to bid prn  Reassuring exam

## 2015-12-16 ENCOUNTER — Encounter: Payer: Self-pay | Admitting: Internal Medicine

## 2015-12-16 ENCOUNTER — Ambulatory Visit (INDEPENDENT_AMBULATORY_CARE_PROVIDER_SITE_OTHER): Payer: Medicare HMO | Admitting: Internal Medicine

## 2015-12-16 ENCOUNTER — Ambulatory Visit (INDEPENDENT_AMBULATORY_CARE_PROVIDER_SITE_OTHER): Payer: Medicare HMO | Admitting: *Deleted

## 2015-12-16 DIAGNOSIS — J3089 Other allergic rhinitis: Secondary | ICD-10-CM

## 2015-12-16 DIAGNOSIS — J4520 Mild intermittent asthma, uncomplicated: Secondary | ICD-10-CM | POA: Diagnosis not present

## 2015-12-16 DIAGNOSIS — J309 Allergic rhinitis, unspecified: Secondary | ICD-10-CM | POA: Diagnosis not present

## 2015-12-16 DIAGNOSIS — G4733 Obstructive sleep apnea (adult) (pediatric): Secondary | ICD-10-CM

## 2015-12-16 DIAGNOSIS — J302 Other seasonal allergic rhinitis: Secondary | ICD-10-CM

## 2015-12-16 MED ORDER — ALBUTEROL SULFATE HFA 108 (90 BASE) MCG/ACT IN AERS: INHALATION_SPRAY | RESPIRATORY_TRACT | 3 refills | Status: DC

## 2015-12-16 MED ORDER — MONTELUKAST SODIUM 10 MG PO TABS: 10.0000 mg | ORAL_TABLET | Freq: Every day | ORAL | 3 refills | Status: DC

## 2015-12-16 NOTE — Assessment & Plan Note (Signed)
Continues very good control. Plan-refills for pro-air and Singulair sent

## 2015-12-16 NOTE — Assessment & Plan Note (Signed)
She has been on allergy shots now for 4 years. Before assuming that she has to stay on them indefinitely, I suggested she come off when current vaccine supply runs out and/or program men's. She can then better judge whether she needs to establish with another allergy office.

## 2015-12-16 NOTE — Assessment & Plan Note (Signed)
Download confirms good compliance and control. She says she can't sleep without it. Plan-continue pressure 11. Comfort issues discussed.

## 2015-12-16 NOTE — Progress Notes (Signed)
Patient ID: [name], female    DOB: [birth year], 50 y.o.   MRN: [id number]  HPI F followed for obstructive sleep apnea and allergic rhinitis, asthma, complicated by insomnia, GERD, depression. CPAP 11/ Advanced  ------------------------------------------    06/14/14- 36 yoF followed for obstructive sleep apnea and allergic rhinitis, asthma, complicated by insomnia, GERD, depression. Follows For: Pt states she wears CPAP 11 Advanced 6-8 hours nightly and states mask fits fine. Tolerating Allergy vaccine 1:50 GH well. Needs to stay at 0.4 mL/vial to avoid local reaction. Rare need for ProAir her rescue inhaler. Asthma does not wake her.  [facility] treating with methotrexate tablets for chronic iritis.  12/14/14- 71 yoF followed for obstructive sleep apnea and allergic rhinitis, asthma, complicated by insomnia, GERD, depression. CPAP 11/ Advanced Allergy vaccine 1:50 GH FOLLOWS FOR: Currently wearing CPAP every night 6-7 horus per night. Denies problems with machine, mask or pressure. Still feels like allergy vaccine is working well for her. Prednisone taper for asthma several weeks ago resolved the problem. Now using rescue inhaler 0-2 times daily. Has not been on maintenance inhaler for a long time but may need it during winter weather. On methotrexate for chronic iritis of unknown etiology. Up-to-date on flu shot. CPAP download confirms excellent compliance and control at 11 CWP/Advanced  12/16/2015-50 year old female never smoker followed for OSA, Allergic rhinitis, Asthma, complicated by insomnia, GERD, depression, chronic iritis/[facility]/methotrexate CPAP 11/ Advanced Allergy vaccine 1:50 GH Follows for pt still on vaccine, [facility] sent over download Download excellent compliance 99%/4 hours and excellent control AHI 1.7/hour. She says she can't sleep without her CPAP now. Very comfortable with no acute needs. Asthma has been very well controlled with no breakthroughs. Med refills  discussed. Infrequent use of rescue inhaler and no sleep disturbance. Sneezing early in the fall, now resolved.   Review of Systems- see HPI Constitutional:   No-   weight loss, night sweats, fevers, chills, fatigue, lassitude. HEENT:   No-  headaches, difficulty swallowing, tooth/dental problems, sore throat,       No- sneezing, itching, ear ache, +nasal congestion, little post nasal drip,  CV:  No-   chest pain, orthopnea, PND, swelling in lower extremities, anasarca, dizziness, palpitations Resp: No-   shortness of breath with exertion or at rest.              No-   productive cough, occasional non-productive cough,  No- coughing up of blood.              No-   change in color of mucus.  No- wheezing.   Skin: No-   rash or lesions. GI:  No-   heartburn, indigestion, abdominal pain, nausea, vomiting,  GU: MS:   Neuro-     nothing unusual Psych:  No- change in mood or affect. No depression or anxiety.  No memory loss.  Objective:   Physical Exam General- Alert, Oriented, Affect-appropriate, Distress- none acute, + Overweight Skin- +keratosis pilaris Lymphadenopathy- none Head- atraumatic            Eyes- Gross vision intact, PERRLA, conjunctivae clear secretions            Ears- Hearing, canals-normal            Nose- mucosa + clear, turbinate edema, +Septal dev,no- polyps, erosion, perforation             Throat- Mallampati II-III , mucosa clear , drainage- none, tonsils- atrophic Neck- flexible , trachea midline, no stridor ,  thyroid nl, carotid no bruit Chest - symmetrical excursion , unlabored           Heart/CV- RRR , no murmur , no gallop  , no rub, nl s1 s2                           - JVD- none , edema- none, stasis changes- none, varices- none           Lung- clear to P&A, wheeze- none, cough- none  , dullness-none, rub- none           Chest wall-  Abd-  Br/ Gen/ Rectal- Not done, not indicated Extrem- cyanosis- none, clubbing, none, atrophy- none, strength- nl Neuro-  grossly intact to observation

## 2015-12-16 NOTE — Patient Instructions (Addendum)
We can continue CPAP Advanced  11 cwp, mask of choice, humidifier, supplies, AirView    Dx OSA  As discussed, our allergy vaccine program will close at the end of December. Most of my patients should see how they do off allergy shots, before assuming they need to continue long-term.   ProAir and Singulair refills sent

## 2016-01-01 ENCOUNTER — Ambulatory Visit: Payer: Medicare HMO

## 2016-03-20 DIAGNOSIS — H35353 Cystoid macular degeneration, bilateral: Secondary | ICD-10-CM | POA: Diagnosis not present

## 2016-03-20 DIAGNOSIS — Z79899 Other long term (current) drug therapy: Secondary | ICD-10-CM | POA: Diagnosis not present

## 2016-03-20 DIAGNOSIS — Z961 Presence of intraocular lens: Secondary | ICD-10-CM | POA: Diagnosis not present

## 2016-03-20 DIAGNOSIS — H20029 Recurrent acute iridocyclitis, unspecified eye: Secondary | ICD-10-CM | POA: Diagnosis not present

## 2016-03-29 ENCOUNTER — Telehealth: Payer: Self-pay | Admitting: Family Medicine

## 2016-03-29 DIAGNOSIS — Z0000 Encounter for general adult medical examination without abnormal findings: Secondary | ICD-10-CM

## 2016-03-29 DIAGNOSIS — R739 Hyperglycemia, unspecified: Secondary | ICD-10-CM

## 2016-03-29 NOTE — Telephone Encounter (Signed)
-----   Message from Marchia Bond sent at 03/27/2016  2:19 PM EDT ----- Regarding: Cpx labs Thurs 3/29, need orders. Thanks! :-) Please order  future cpx labs for pt's upcoming lab appt. Thanks Aniceto Boss

## 2016-04-02 ENCOUNTER — Other Ambulatory Visit: Payer: Medicare HMO

## 2016-04-03 ENCOUNTER — Other Ambulatory Visit: Payer: Medicare HMO

## 2016-04-05 ENCOUNTER — Telehealth: Payer: Self-pay | Admitting: Family Medicine

## 2016-04-05 NOTE — Telephone Encounter (Signed)
Already done

## 2016-04-10 ENCOUNTER — Encounter: Payer: Medicare HMO | Admitting: Family Medicine

## 2016-04-10 ENCOUNTER — Ambulatory Visit (INDEPENDENT_AMBULATORY_CARE_PROVIDER_SITE_OTHER): Payer: Medicare HMO

## 2016-04-10 VITALS — BP 122/88 | HR 55 | Temp 98.5°F | Ht 67.0 in | Wt 217.5 lb

## 2016-04-10 DIAGNOSIS — Z0000 Encounter for general adult medical examination without abnormal findings: Secondary | ICD-10-CM | POA: Diagnosis not present

## 2016-04-10 DIAGNOSIS — R739 Hyperglycemia, unspecified: Secondary | ICD-10-CM

## 2016-04-10 LAB — LIPID PANEL
Cholesterol: 173 mg/dL (ref 0–200)
HDL: 58.9 mg/dL (ref >39.00)
LDL Cholesterol: 99 mg/dL (ref 0–99)
NonHDL: 113.9
Total CHOL/HDL Ratio: 3
Triglycerides: 75 mg/dL (ref 0.0–149.0)
VLDL: 15 mg/dL (ref 0.0–40.0)

## 2016-04-10 LAB — COMPREHENSIVE METABOLIC PANEL
ALBUMIN: 4.1 g/dL (ref 3.5–5.2)
ALK PHOS: 88 U/L (ref 39–117)
ALT: 14 U/L (ref 0–35)
AST: 21 U/L (ref 0–37)
BILIRUBIN TOTAL: 0.6 mg/dL (ref 0.2–1.2)
BUN: 11 mg/dL (ref 6–23)
CO2: 29 mEq/L (ref 19–32)
CREATININE: 0.88 mg/dL (ref 0.40–1.20)
Calcium: 9.5 mg/dL (ref 8.4–10.5)
Chloride: 105 mEq/L (ref 96–112)
GFR: 72.65 mL/min (ref >60.00)
Glucose, Bld: 96 mg/dL (ref 70–99)
POTASSIUM: 4.3 meq/L (ref 3.5–5.1)
SODIUM: 140 meq/L (ref 135–145)
TOTAL PROTEIN: 6.9 g/dL (ref 6.0–8.3)

## 2016-04-10 LAB — HEMOGLOBIN A1C: HEMOGLOBIN A1C: 6.4 % (ref 4.6–6.5)

## 2016-04-10 LAB — CBC WITH DIFFERENTIAL/PLATELET
Basophils Absolute: 0.1 10*3/uL (ref 0.0–0.1)
Basophils Relative: 1.2 % (ref 0.0–3.0)
EOS PCT: 4.3 % (ref 0.0–5.0)
Eosinophils Absolute: 0.3 10*3/uL (ref 0.0–0.7)
HCT: 43 % (ref 36.0–46.0)
HEMOGLOBIN: 14.2 g/dL (ref 12.0–15.0)
Lymphocytes Relative: 44 % (ref 12.0–46.0)
Lymphs Abs: 2.7 10*3/uL (ref 0.7–4.0)
MCHC: 33.1 g/dL (ref 30.0–36.0)
MCV: 92.6 fl (ref 78.0–100.0)
MONO ABS: 0.5 10*3/uL (ref 0.1–1.0)
MONOS PCT: 8.2 % (ref 3.0–12.0)
Neutro Abs: 2.6 10*3/uL (ref 1.4–7.7)
Neutrophils Relative %: 42.3 % — ABNORMAL LOW (ref 43.0–77.0)
Platelets: 232 10*3/uL (ref 150.0–400.0)
RBC: 4.64 Mil/uL (ref 3.87–5.11)
RDW: 15.5 % (ref 11.5–15.5)
WBC: 6 10*3/uL (ref 4.0–10.5)

## 2016-04-10 LAB — TSH: TSH: 3.31 u[IU]/mL (ref 0.35–4.50)

## 2016-04-10 NOTE — Progress Notes (Signed)
Pre visit review using our clinic review tool, if applicable. No additional management support is needed unless otherwise documented below in the visit note. 

## 2016-04-10 NOTE — Progress Notes (Signed)
PCP notes:   Health maintenance:  Mammogram - addressed; pt will discuss with PCP at next appt  Abnormal screenings:   Fall risk - hx of fall with injury  Patient concerns:   Pt reports concerns with vision. Pt has been going to [facility] for management of this concern.   Nurse concerns:  None  Next PCP appt:   04/22/16 @ 1215  I reviewed health advisor's note, was available for consultation on the day of service listed in this note, and agree with documentation and plan. Elsie Stain, MD.

## 2016-04-10 NOTE — Progress Notes (Signed)
Subjective:   [name] is a 51 y.o. female who presents for Medicare Annual (Subsequent) preventive examination.  Review of Systems:  N/A Cardiac Risk Factors include: advanced age (>61men, >59 women);hypertension;obesity (BMI >30kg/m2)     Objective:     Vitals: BP 122/88 (BP Location: Right Arm, Patient Position: Sitting, Cuff Size: Normal) Comment: no medication taken  Pulse (!) 55   Temp 98.5 F (36.9 C) (Oral)   Ht 5\' 7"  (1.702 m) Comment: no shoes  Wt 217 lb 8 oz (98.7 kg)   LMP 08/05/2006   SpO2 97%   BMI 34.07 kg/m   Body mass index is 34.07 kg/m.   Tobacco History  Smoking Status  . Never Smoker  Smokeless Tobacco  . Never Used     Counseling given: No   Past Medical History:  Diagnosis Date  . Allergy    allergic rhinitis  . Asthma   . Chronic headaches   . Degenerative disc disease   . Depression   . Fibromyalgia   . Fx of fibula 2009  . GERD (gastroesophageal reflux disease)   . Hemorrhoids   . Hypertension   . IBS (irritable bowel syndrome)   . Iritis, chronic   . Macular hole of right eye   . Sleep apnea    Past Surgical History:  Procedure Laterality Date  . ABDOMINAL HYSTERECTOMY    . ANTERIOR CRUCIATE LIGAMENT REPAIR  1996   small tear of ACL  . BREAST SURGERY     left nipple removed - benign tumor  . BUNIONECTOMY    . LAPAROSCOPY  08/2004   endometriosis - several   . TONSILLECTOMY     Family History  Problem Relation Age of Onset  . Hypertension Mother   . Thyroid disease Mother   . Fibromyalgia Mother   . Colon polyps Mother   . Diabetes Father   . Kidney cancer Maternal Grandmother   . Colon cancer Neg Hx    History  Sexual Activity  . Sexual activity: No    Outpatient Encounter Prescriptions as of 04/10/2016  Medication Sig  . acetaminophen (TYLENOL) 500 MG tablet OTC as directed.   . Adalimumab (HUMIRA) 40 MG/0.8ML PSKT Inject 40 mg into the skin.   Marland Kitchen albuterol (PROAIR HFA) 108 (90 Base) MCG/ACT inhaler  Inhale 2 puffs, every 6 hours if needed for asthma  . buPROPion (WELLBUTRIN XL) 300 MG 24 hr tablet Take 1 tablet (300 mg total) by mouth daily.  . carvedilol (COREG) 6.25 MG tablet Take 1 tablet (6.25 mg total) by mouth 2 (two) times daily with a meal.  . Cetirizine HCl (ZYRTEC ALLERGY PO) Take 10 mg by mouth as needed.  . Cholecalciferol (VITAMIN D3 PO) Take 1 capsule by mouth daily.  . [employer] TRIPLE MAGNESIUM COMPLEX PO Take 1 capsule by mouth daily.  . diphenhydrAMINE (BENADRYL) 25 mg capsule Take 25 mg by mouth as needed. OTC as directed.   Marland Kitchen esomeprazole (NEXIUM) 20 MG capsule Take 1 capsule (20 mg total) by mouth daily at 12 noon.  . folic acid (FOLVITE) 1 MG tablet Take 3 mg by mouth 4 (four) times a week.   Marland Kitchen ibuprofen (ADVIL,MOTRIN) 200 MG tablet Take 200 mg by mouth every 6 (six) hours as needed.  Marland Kitchen KETOROLAC TROMETHAMINE OP Place 4 drops into both eyes daily.  Marland Kitchen leucovorin (WELLCOVORIN) 5 MG tablet Take 5 mg by mouth 2 (two) times a week.  . losartan (COZAAR) 50 MG tablet Take 1  tablet (50 mg total) by mouth daily.  . methocarbamol (ROBAXIN) 500 MG tablet TAKE 1 TABLET (500 MG TOTAL) BY MOUTH AT BEDTIME AS NEEDED.  . methotrexate 50 MG/2ML injection Inject 25 mg into the vein once a week.  . montelukast (SINGULAIR) 10 MG tablet Take 1 tablet (10 mg total) by mouth at bedtime. (Patient taking differently: Take 10 mg by mouth as needed. )  . prednisoLONE acetate (PRED FORTE) 1 % ophthalmic suspension Place 4 drops into both eyes daily.   . traMADol (ULTRAM) 50 MG tablet TAKE 1 TABLET (50 MG TOTAL) BY MOUTH EVERY 8 (EIGHT) HOURS AS NEEDED FOR PAIN.  . [DISCONTINUED] Fluticasone-Salmeterol (ADVAIR DISKUS) 100-50 MCG/DOSE AEPB Inhale 1 puff then rinse mouth, twice daily maintenance  . [DISCONTINUED] lidocaine (XYLOCAINE) 2 % solution Apply 1 application topically every 2 (two) hours as needed. To sores in mouth - Topical (Patient not taking: Reported on 12/16/2015)  . [DISCONTINUED]  methotrexate (RHEUMATREX) 2.5 MG tablet Take 25 mg by mouth once a week. Caution:Chemotherapy. Protect from light.  . [DISCONTINUED] NONFORMULARY OR COMPOUNDED ITEM Allergy Vaccine 1:50 Given at [facility]   No facility-administered encounter medications on file as of 04/10/2016.     Activities of Daily Living In your present state of health, do you have any difficulty performing the following activities: 04/10/2016  Hearing? N  Vision? Y  Difficulty concentrating or making decisions? N  Walking or climbing stairs? N  Dressing or bathing? N  Doing errands, shopping? N  Preparing Food and eating ? N  Using the Toilet? N  In the past six months, have you accidently leaked urine? N  Do you have problems with loss of bowel control? N  Managing your Medications? N  Managing your Finances? N  Housekeeping or managing your Housekeeping? N  Some recent data might be hidden    Patient Care Team: Abner Greenspan, MD as PCP - General Deneise Lever, MD as Consulting Physician (Pulmonary Disease) Carmelina Dane, MD as Referring Physician (Ophthalmology) Candee Furbish, MD as Referring Physician (Internal Medicine) Sharyne Peach, MD as Consulting Physician (Ophthalmology)    Assessment:      Hearing Screening   125Hz  250Hz  500Hz  1000Hz  2000Hz  3000Hz  4000Hz  6000Hz  8000Hz   Right ear:   40 40 40  40    Left ear:   40 40 40  40    Vision Screening Comments: Last vision exam in March 2018 @ [facility]   Exercise Activities and Dietary recommendations Current Exercise Habits: The patient does not participate in regular exercise at present, Exercise limited by: None identified  Goals    . Increase water intake          Starting 04/10/2016, I will continue to drink at least 64 oz of water daily.     . Reduce sugar intake to X grams per day          Starting 03/20/2015, I will continue to decrease intake of sugar to less than 6 tsps per day.       Fall Risk Fall Risk  04/10/2016  03/20/2015 01/24/2014 11/02/2012  Falls in the past year? Yes Yes Yes No  Number falls in past yr: 1 1 1  -  Injury with Fall? Yes No Yes -  Follow up - Falls evaluation completed - -   Depression Screen PHQ 2/9 Scores 04/10/2016 03/20/2015 01/24/2014 11/02/2012  PHQ - 2 Score 0 0 0 0     Cognitive Function MMSE - Mini Mental  State Exam 04/10/2016 03/20/2015  Orientation to time 5 5  Orientation to Place 5 5  Registration 3 3  Attention/ Calculation 0 5  Recall 3 3  Language- name 2 objects 0 0  Language- repeat 1 1  Language- follow 3 step command 3 3  Language- read & follow direction 0 1  Write a sentence 0 0  Copy design 0 0  Total score 20 26     PLEASE NOTE: A Mini-Cog screen was completed. Maximum score is 20. A value of 0 denotes this part of Folstein MMSE was not completed or the patient failed this part of the Mini-Cog screening.   Mini-Cog Screening Orientation to Time - Max 5 pts Orientation to Place - Max 5 pts Registration - Max 3 pts Recall - Max 3 pts Language Repeat - Max 1 pts Language Follow 3 Step Command - Max 3 pts     Immunization History  Administered Date(s) Administered  . Influenza Split 10/29/2010, 10/02/2011  . Influenza Whole 10/13/2006, 10/03/2008, 10/09/2009  . Influenza,inj,Quad PF,36+ Mos 09/14/2012, 10/11/2013, 09/12/2015  . Influenza-Unspecified 09/18/2014  . PPD Test 03/24/2012  . Pneumococcal Polysaccharide-23 11/02/2012  . Tdap 07/20/2011   Screening Tests Health Maintenance  Topic Date Due  . MAMMOGRAM  04/10/2017 (Originally 11/16/2012)  . PAP SMEAR  02/07/2020 (Originally 05/06/2007)  . HIV Screening  02/07/2020 (Originally 06/19/1982)  . INFLUENZA VACCINE  08/05/2016  . TETANUS/TDAP  07/19/2021      Plan:      I have personally reviewed and addressed the Medicare Annual Wellness questionnaire and have noted the following in the patient's chart:  A. Medical and social history B. Use of alcohol, tobacco or illicit drugs   C. Current medications and supplements D. Functional ability and status E.  Nutritional status F.  Physical activity G. Advance directives H. List of other physicians I.  Hospitalizations, surgeries, and ER visits in previous 12 months J.  [place] to include hearing, vision, cognitive, depression L. Referrals and appointments - none  In addition, I have reviewed and discussed with patient certain preventive protocols, quality metrics, and best practice recommendations. A written personalized care plan for preventive services as well as general preventive health recommendations were provided to patient.  See attached scanned questionnaire for additional information.   Signed,   Lindell Noe, MHA, BS, LPN Health Coach

## 2016-04-10 NOTE — Patient Instructions (Signed)
Ms. Penado , Thank you for taking time to come for your Medicare Wellness Visit. I appreciate your ongoing commitment to your health goals. Please review the following plan we discussed and let me know if I can assist you in the future.   These are the goals we discussed: Goals    . Increase water intake          Starting 04/10/2016, I will continue to drink at least 64 oz of water daily.     . Reduce sugar intake to X grams per day          Starting 03/20/2015, I will continue to decrease intake of sugar to less than 6 tsps per day.        This is a list of the screening recommended for you and due dates:  Health Maintenance  Topic Date Due  . Mammogram  04/10/2017*  . Pap Smear  02/07/2020*  . HIV Screening  02/07/2020*  . Flu Shot  08/05/2016  . Tetanus Vaccine  07/19/2021  *Topic was postponed. The date shown is not the original due date.   Preventive Care for Adults  A healthy lifestyle and preventive care can promote health and wellness. Preventive health guidelines for adults include the following key practices.  . A routine yearly physical is a good way to check with your health care provider about your health and preventive screening. It is a chance to share any concerns and updates on your health and to receive a thorough exam.  . Visit your dentist for a routine exam and preventive care every 6 months. Brush your teeth twice a day and floss once a day. Good oral hygiene prevents tooth decay and gum disease.  . The frequency of eye exams is based on your age, health, family medical history, use  of contact lenses, and other factors. Follow your health care provider's ecommendations for frequency of eye exams.  . Eat a healthy diet. Foods like vegetables, fruits, whole grains, low-fat dairy products, and lean protein foods contain the nutrients you need without too many calories. Decrease your intake of foods high in solid fats, added sugars, and salt. Eat the right  amount of calories for you. Get information about a proper diet from your health care provider, if necessary.  . Regular physical exercise is one of the most important things you can do for your health. Most adults should get at least 150 minutes of moderate-intensity exercise (any activity that increases your heart rate and causes you to sweat) each week. In addition, most adults need muscle-strengthening exercises on 2 or more days a week.  Silver Sneakers may be a benefit available to you. To determine eligibility, you may visit the website: www.silversneakers.com or contact program at (229) 445-0747 Mon-Fri between 8AM-8PM.   . Maintain a healthy weight. The body mass index (BMI) is a screening tool to identify possible weight problems. It provides an estimate of body fat based on height and weight. Your health care provider can find your BMI and can help you achieve or maintain a healthy weight.   For adults 20 years and older: ? A BMI below 18.5 is considered underweight. ? A BMI of 18.5 to 24.9 is normal. ? A BMI of 25 to 29.9 is considered overweight. ? A BMI of 30 and above is considered obese.   . Maintain normal blood lipids and cholesterol levels by exercising and minimizing your intake of saturated fat. Eat a balanced diet with plenty of fruit  and vegetables. Blood tests for lipids and cholesterol should begin at age 35 and be repeated every 5 years. If your lipid or cholesterol levels are high, you are over 50, or you are at high risk for heart disease, you may need your cholesterol levels checked more frequently. Ongoing high lipid and cholesterol levels should be treated with medicines if diet and exercise are not working.  . If you smoke, find out from your health care provider how to quit. If you do not use tobacco, please do not start.  . If you choose to drink alcohol, please do not consume more than 2 drinks per day. One drink is considered to be 12 ounces (355 mL) of beer, 5  ounces (148 mL) of wine, or 1.5 ounces (44 mL) of liquor.  . If you are 48-52 years old, ask your health care provider if you should take aspirin to prevent strokes.  . Use sunscreen. Apply sunscreen liberally and repeatedly throughout the day. You should seek shade when your shadow is shorter than you. Protect yourself by wearing long sleeves, pants, a wide-brimmed hat, and sunglasses year round, whenever you are outdoors.  . Once a month, do a whole body skin exam, using a mirror to look at the skin on your back. Tell your health care provider of new moles, moles that have irregular borders, moles that are larger than a pencil eraser, or moles that have changed in shape or color.

## 2016-04-22 ENCOUNTER — Encounter: Payer: Self-pay | Admitting: Family Medicine

## 2016-04-22 ENCOUNTER — Telehealth: Payer: Self-pay

## 2016-04-22 ENCOUNTER — Ambulatory Visit (INDEPENDENT_AMBULATORY_CARE_PROVIDER_SITE_OTHER): Payer: Medicare HMO | Admitting: Family Medicine

## 2016-04-22 ENCOUNTER — Other Ambulatory Visit: Payer: Self-pay | Admitting: Family Medicine

## 2016-04-22 VITALS — BP 128/82 | HR 86 | Temp 98.3°F | Ht 67.0 in | Wt 217.0 lb

## 2016-04-22 DIAGNOSIS — Z0000 Encounter for general adult medical examination without abnormal findings: Secondary | ICD-10-CM | POA: Diagnosis not present

## 2016-04-22 DIAGNOSIS — J019 Acute sinusitis, unspecified: Secondary | ICD-10-CM | POA: Insufficient documentation

## 2016-04-22 DIAGNOSIS — H20021 Recurrent acute iridocyclitis, right eye: Secondary | ICD-10-CM | POA: Diagnosis not present

## 2016-04-22 DIAGNOSIS — M797 Fibromyalgia: Secondary | ICD-10-CM

## 2016-04-22 DIAGNOSIS — I10 Essential (primary) hypertension: Secondary | ICD-10-CM | POA: Diagnosis not present

## 2016-04-22 DIAGNOSIS — R739 Hyperglycemia, unspecified: Secondary | ICD-10-CM

## 2016-04-22 DIAGNOSIS — Z1231 Encounter for screening mammogram for malignant neoplasm of breast: Secondary | ICD-10-CM

## 2016-04-22 DIAGNOSIS — J0110 Acute frontal sinusitis, unspecified: Secondary | ICD-10-CM | POA: Diagnosis not present

## 2016-04-22 MED ORDER — LOSARTAN POTASSIUM 50 MG PO TABS: 50.0000 mg | ORAL_TABLET | Freq: Every day | ORAL | 3 refills | Status: DC

## 2016-04-22 MED ORDER — CARVEDILOL 6.25 MG PO TABS: 6.2500 mg | ORAL_TABLET | Freq: Two times a day (BID) | ORAL | 3 refills | Status: DC

## 2016-04-22 MED ORDER — BUPROPION HCL ER (XL) 300 MG PO TB24: 300.0000 mg | ORAL_TABLET | Freq: Every day | ORAL | 3 refills | Status: DC

## 2016-04-22 MED ORDER — ESOMEPRAZOLE MAGNESIUM 20 MG PO CPDR: 20.0000 mg | DELAYED_RELEASE_CAPSULE | Freq: Every day | ORAL | 3 refills | Status: DC

## 2016-04-22 MED ORDER — AMOXICILLIN 500 MG PO CAPS: 500.0000 mg | ORAL_CAPSULE | Freq: Three times a day (TID) | ORAL | 0 refills | Status: DC

## 2016-04-22 MED ORDER — AMOXICILLIN-POT CLAVULANATE 875-125 MG PO TABS: 1.0000 | ORAL_TABLET | Freq: Two times a day (BID) | ORAL | 0 refills | Status: DC

## 2016-04-22 NOTE — Assessment & Plan Note (Signed)
Cover with augmentin  Disc symptomatic care - see instructions on AVS  Update if not starting to improve in a week or if worsening   

## 2016-04-22 NOTE — Telephone Encounter (Signed)
Pharmacist advise to cancel Rx for Augmentin and Rx for Amoxicillin called in as prescribed

## 2016-04-22 NOTE — Assessment & Plan Note (Signed)
Reviewed health habits including diet and exercise and skin cancer prevention Reviewed appropriate screening tests for age  Also reviewed health mt list, fam hx and immunization status , as well as social and family history   See HPI Enc wt loss and low impact exercise program Mammogram is scheduled next month Labs reviewed

## 2016-04-22 NOTE — Assessment & Plan Note (Signed)
Autoimmune Goes to [facility] On MTX and Humira currently

## 2016-04-22 NOTE — Telephone Encounter (Signed)
Please change to amoxicillin 500 mg 1 po tid #21 no ref  Thanks

## 2016-04-22 NOTE — Progress Notes (Signed)
Subjective:    Patient ID: [name], female    DOB: 14-Jan-1965, 51 y.o.   MRN: [id number]  HPI Here for health maintenance exam and to review chronic medical problems   She also has new sinus symptoms   Started getting a cold on sat  Now worse today Headache- frontal  Pressure over temples  Nose is sore  Nasal d/c is scant  Coughing up dark material/ brown  Sore throat - on and off/ not as bad as it was  No wheezing but a little burning when she breathes deep  Feels achey- but no fever  Takes zyrtec otc right now     Had amw 4/6 Noted hx of a fall - tripped over some 2 by 4 s on the floor re modeling bathroom  Is more careful now   Being seen a [facility] for ophty- recurrent iritis and cystoid macular edema -auto immune  May need surgery on L eye in the future  Changed to mtx inj and also humira  f/u in July    Wt Readings from Last 3 Encounters:  04/22/16 217 lb (98.4 kg)  04/10/16 217 lb 8 oz (98.7 kg)  12/16/15 218 lb 9.6 oz (99.2 kg)  not eating healthy-plans to work on it  Does not like exercise  bmi 33.9  HIV screen declined  M[facility]ogram 11/13- [facility] Self breast exam -no lumps   Pap/gyn care 5/06 Had a hysterectomy total No gyn sympotoms  No new partners    Flu vaccine 9/17 Tetanus vaccine 7/13  Colonoscopy dx 4/12 normal      bp is stable today  No cp or palpitations or headaches or edema  No side effects to medicines  BP Readings from Last 3 Encounters:  04/22/16 128/82  04/10/16 122/88  12/16/15 120/68      Hx of hyperglycemia Lab Results  Component Value Date   HGBA1C 6.4 04/10/2016  was 6.3 last time    Cholesterol Lab Results  Component Value Date   CHOL 173 04/10/2016   CHOL 156 03/13/2015   CHOL 175 01/17/2014   Lab Results  Component Value Date   HDL 58.90 04/10/2016   HDL 43.00 03/13/2015   HDL 54.20 01/17/2014   Lab Results  Component Value Date   LDLCALC 99 04/10/2016    LDLCALC 98 03/13/2015   LDLCALC 94 01/17/2014   Lab Results  Component Value Date   TRIG 75.0 04/10/2016   TRIG 77.0 03/13/2015   TRIG 135.0 01/17/2014   Lab Results  Component Value Date   CHOLHDL 3 04/10/2016   CHOLHDL 4 03/13/2015   CHOLHDL 3 01/17/2014   No results found for: LDLDIRECT Very good cholesterol profile  She is aware of fried foods and greasy foods   Results for orders placed or performed in visit on 04/10/16  CBC with Differential/Platelet  Result Value Ref Range   WBC 6.0 4.0 - 10.5 K/uL   RBC 4.64 3.87 - 5.11 Mil/uL   Hemoglobin 14.2 12.0 - 15.0 g/dL   HCT 43.0 36.0 - 46.0 %   MCV 92.6 78.0 - 100.0 fl   MCHC 33.1 30.0 - 36.0 g/dL   RDW 15.5 11.5 - 15.5 %   Platelets 232.0 150.0 - 400.0 K/uL   Neutrophils Relative % 42.3 (L) 43.0 - 77.0 %   Lymphocytes Relative 44.0 12.0 - 46.0 %   Monocytes Relative 8.2 3.0 - 12.0 %   Eosinophils Relative 4.3  0.0 - 5.0 %   Basophils Relative 1.2 0.0 - 3.0 %   Neutro Abs 2.6 1.4 - 7.7 K/uL   Lymphs Abs 2.7 0.7 - 4.0 K/uL   Monocytes Absolute 0.5 0.1 - 1.0 K/uL   Eosinophils Absolute 0.3 0.0 - 0.7 K/uL   Basophils Absolute 0.1 0.0 - 0.1 K/uL  Comprehensive metabolic panel  Result Value Ref Range   Sodium 140 135 - 145 mEq/L   Potassium 4.3 3.5 - 5.1 mEq/L   Chloride 105 96 - 112 mEq/L   CO2 29 19 - 32 mEq/L   Glucose, Bld 96 70 - 99 mg/dL   BUN 11 6 - 23 mg/dL   Creatinine, Ser 0.88 0.40 - 1.20 mg/dL   Total Bilirubin 0.6 0.2 - 1.2 mg/dL   Alkaline Phosphatase 88 39 - 117 U/L   AST 21 0 - 37 U/L   ALT 14 0 - 35 U/L   Total Protein 6.9 6.0 - 8.3 g/dL   Albumin 4.1 3.5 - 5.2 g/dL   Calcium 9.5 8.4 - 10.5 mg/dL   GFR 72.65 >60.00 mL/min  Hemoglobin A1c  Result Value Ref Range   Hgb A1c MFr Bld 6.4 4.6 - 6.5 %  Lipid panel  Result Value Ref Range   Cholesterol 173 0 - 200 mg/dL   Triglycerides 75.0 0.0 - 149.0 mg/dL   HDL 58.90 >39.00 mg/dL   VLDL 15.0 0.0 - 40.0 mg/dL   LDL Cholesterol 99 0 - 99 mg/dL    Total CHOL/HDL Ratio 3    NonHDL 113.90   TSH  Result Value Ref Range   TSH 3.31 0.35 - 4.50 uIU/mL    Patient Active Problem List   Diagnosis Date Noted  . Acute sinusitis 04/22/2016  . Chronic low back pain 12/09/2015  . Degeneration of lumbar or lumbosacral intervertebral disc 12/09/2015  . Nasal septal deviation 01/03/2013  . Encounter for Medicare annual wellness exam 11/02/2012  . Hydradenitis 11/02/2012  . Hyperglycemia 03/23/2012  . Macular hole 03/23/2012  . Screening-pulmonary TB 03/23/2012  . Routine general medical examination at a health care facility 10/02/2011  . Iritis, recurrent 10/02/2011  . Tachycardia 10/02/2011  . ADD (attention deficit disorder) 08/20/2010  . DYSHIDROTIC ECZEMA, HANDS 11/12/2009  . Obstructive sleep apnea 11/19/2006  . DEPRESSION 09/29/2006  . Essential hypertension 09/29/2006  . Seasonal and perennial allergic rhinitis 09/29/2006  . Asthma, mild intermittent, well-controlled 09/29/2006  . GERD 09/29/2006  . IBS 09/29/2006  . ENDOMETRIOSIS 09/29/2006  . FEMALE INFERTILITY 09/29/2006  . Fibromyalgia 09/29/2006  . INSOMNIA 09/29/2006   Past Medical History:  Diagnosis Date  . Allergy    allergic rhinitis  . Asthma   . Chronic headaches   . Degenerative disc disease   . Depression   . Fibromyalgia   . Fx of fibula 2009  . GERD (gastroesophageal reflux disease)   . Hemorrhoids   . Hypertension   . IBS (irritable bowel syndrome)   . Iritis, chronic   . Macular hole of right eye   . Sleep apnea    Past Surgical History:  Procedure Laterality Date  . ABDOMINAL HYSTERECTOMY    . ANTERIOR CRUCIATE LIGAMENT REPAIR  1996   small tear of ACL  . BREAST SURGERY     left nipple removed - benign tumor  . BUNIONECTOMY    . LAPAROSCOPY  08/2004   endometriosis - several   . TONSILLECTOMY     Social History  Substance Use Topics  . Smoking  status: Never Smoker  . Smokeless tobacco: Never Used  . Alcohol use No   Family  History  Problem Relation Age of Onset  . Hypertension Mother   . Thyroid disease Mother   . Fibromyalgia Mother   . Colon polyps Mother   . Diabetes Father   . Kidney cancer Maternal Grandmother   . Colon cancer Neg Hx    Allergies  Allergen Reactions  . Cetirizine Hcl     REACTION: not effective  . Erythromycin     REACTION: stomach hurts  . Escitalopram Oxalate     REACTION: no improvement  . Lisinopril     REACTION: cough  . Loratadine     REACTION: not effective  . Nifedipine     REACTION: fatigued and tremor  . Sertraline Hcl     REACTION: no help  . Vyvanse [Lisdexamfetamine Dimesylate]     Headache and excitablility   Current Outpatient Prescriptions on File Prior to Visit  Medication Sig Dispense Refill  . acetaminophen (TYLENOL) 500 MG tablet OTC as directed.     . Adalimumab (HUMIRA) 40 MG/0.8ML PSKT Inject 40 mg into the skin.     Marland Kitchen albuterol (PROAIR HFA) 108 (90 Base) MCG/ACT inhaler Inhale 2 puffs, every 6 hours if needed for asthma 9 each 3  . Cetirizine HCl (ZYRTEC ALLERGY PO) Take 10 mg by mouth as needed.    . Cholecalciferol (VITAMIN D3 PO) Take 1 capsule by mouth daily.    . [employer] TRIPLE MAGNESIUM COMPLEX PO Take 1 capsule by mouth daily.    . diphenhydrAMINE (BENADRYL) 25 mg capsule Take 25 mg by mouth as needed. OTC as directed.     . folic acid (FOLVITE) 1 MG tablet Take 3 mg by mouth 4 (four) times a week.     Marland Kitchen ibuprofen (ADVIL,MOTRIN) 200 MG tablet Take 200 mg by mouth every 6 (six) hours as needed.    Marland Kitchen KETOROLAC TROMETHAMINE OP Place 4 drops into both eyes daily.    Marland Kitchen leucovorin (WELLCOVORIN) 5 MG tablet Take 5 mg by mouth 2 (two) times a week.    . methocarbamol (ROBAXIN) 500 MG tablet TAKE 1 TABLET (500 MG TOTAL) BY MOUTH AT BEDTIME AS NEEDED. 30 tablet 3  . methotrexate 50 MG/2ML injection Inject 25 mg into the vein once a week.    . montelukast (SINGULAIR) 10 MG tablet Take 1 tablet (10 mg total) by mouth at bedtime. (Patient taking  differently: Take 10 mg by mouth as needed. ) 90 tablet 3  . prednisoLONE acetate (PRED FORTE) 1 % ophthalmic suspension Place 4 drops into both eyes daily.     . traMADol (ULTRAM) 50 MG tablet TAKE 1 TABLET (50 MG TOTAL) BY MOUTH EVERY 8 (EIGHT) HOURS AS NEEDED FOR PAIN. 30 tablet 3   No current facility-administered medications on file prior to visit.     Review of Systems Review of Systems  Constitutional: Negative for fever, appetite change, fatigue and unexpected weight change.  Eyes: Negative for pain and visual disturbance.  ENT pos for cong and sinus pain  Respiratory: Negative for cough and shortness of breath.   Cardiovascular: Negative for cp or palpitations    Gastrointestinal: Negative for nausea, diarrhea and constipation.  Genitourinary: Negative for urgency and frequency.  MSK pos for chronic myofascial pain  Skin: Negative for pallor or rash   Neurological: Negative for weakness, light-headedness, numbness and headaches.  Hematological: Negative for adenopathy. Does not bruise/bleed easily.  Psychiatric/Behavioral: Negative for  dysphoric mood. The patient is not nervous/anxious.         Objective:   Physical Exam  Constitutional: She appears well-developed and well-nourished. No distress.  HENT:  Head: Normocephalic and atraumatic.  Right Ear: External ear normal.  Left Ear: External ear normal.  Mouth/Throat: Oropharynx is clear and moist. No oropharyngeal exudate.  Nares are injected and congested  Bilateral frontal sinus tenderness  Post nasal drip   Eyes: Conjunctivae and EOM are normal. Pupils are equal, round, and reactive to light. Right eye exhibits no discharge. Left eye exhibits no discharge. No scleral icterus.  Neck: Normal range of motion. Neck supple. No JVD present. Carotid bruit is not present. No thyromegaly present.  Cardiovascular: Normal rate, regular rhythm, normal heart sounds and intact distal pulses.  Exam reveals no gallop.     Pulmonary/Chest: Effort normal and breath sounds normal. No respiratory distress. She has no wheezes. She has no rales. She exhibits no tenderness.  Abdominal: Soft. Bowel sounds are normal. She exhibits no distension, no abdominal bruit and no mass. There is no tenderness.  Genitourinary: No breast swelling, tenderness, discharge or bleeding.  Genitourinary Comments: Breast exam: No mass, nodules, thickening, tenderness, bulging, retraction, inflamation, nipple discharge or skin changes noted.  No axillary or clavicular LA.      Musculoskeletal: She exhibits no edema or tenderness.  Diffuse myofascial tenderness  Lymphadenopathy:    She has no cervical adenopathy.  Neurological: She is alert. She has normal reflexes. No cranial nerve deficit. She exhibits normal muscle tone. Coordination normal.  Skin: Skin is warm and dry. No rash noted. No erythema. No pallor.  Solar lentigines diffusely Stable SK mid back  Psychiatric: She has a normal mood and affect.          Assessment & Plan:   Problem List Items Addressed This Visit      Cardiovascular and Mediastinum   Essential hypertension - Primary    bp in fair control at this time  BP Readings from Last 1 Encounters:  04/22/16 128/82   No changes needed Disc lifstyle change with low sodium diet and exercise  Labs reviewed  Wt loss enc       Relevant Medications   carvedilol (COREG) 6.25 MG tablet   losartan (COZAAR) 50 MG tablet     Respiratory   Acute sinusitis    Cover with augmentin  Disc symptomatic care - see instructions on AVS  Update if not starting to improve in a week or if worsening        Relevant Medications   amoxicillin-clavulanate (AUGMENTIN) 875-125 MG tablet     Other   Fibromyalgia    Stable currently  Enc low impact exercise      Hyperglycemia    Lab Results  Component Value Date   HGBA1C 6.4 04/10/2016   This is up slt  Disc DM prev disc imp of low glycemic diet and wt loss to  prevent DM2 Given handout on diet /etc      Iritis, recurrent    Autoimmune Goes to [facility] On MTX and Humira currently      Routine general medical examination at a health care facility    Reviewed health habits including diet and exercise and skin cancer prevention Reviewed appropriate screening tests for age  Also reviewed health mt list, fam hx and immunization status , as well as social and family history   See HPI Enc wt loss and low impact exercise program Mammogram is scheduled  next month Labs reviewed

## 2016-04-22 NOTE — Assessment & Plan Note (Signed)
bp in fair control at this time  BP Readings from Last 1 Encounters:  04/22/16 128/82   No changes needed Disc lifstyle change with low sodium diet and exercise  Labs reviewed  Wt loss enc

## 2016-04-22 NOTE — Assessment & Plan Note (Signed)
Stable currently  Enc low impact exercise

## 2016-04-22 NOTE — Patient Instructions (Addendum)
Think about what you want to do for low impact exercise  Water walking/ water aerobics  Stretching/yoga  For prediabetes - get your carbs from produce instead of snack foods and bread/pasta/rice/crackers etc   Try nasal saline for congestion (I like simply saline)  Warm compress on face if needed  Breathe steam   Take the augmentin for sinus infection as directed

## 2016-04-22 NOTE — Progress Notes (Signed)
Pre visit review using our clinic review tool, if applicable. No additional management support is needed unless otherwise documented below in the visit note. 

## 2016-04-22 NOTE — Telephone Encounter (Signed)
[facility] [place](person did not leave their name) left v/m; received rx for augmentin and there is a possible drug interaction between augmentin and methotrexate; pt has taken plain amoxicillin before; [facility] [place] request cb.

## 2016-04-22 NOTE — Assessment & Plan Note (Signed)
Lab Results  Component Value Date   HGBA1C 6.4 04/10/2016   This is up slt  Disc DM prev disc imp of low glycemic diet and wt loss to prevent DM2 Given handout on diet [name]

## 2016-05-14 ENCOUNTER — Ambulatory Visit
Admission: RE | Admit: 2016-05-14 | Discharge: 2016-05-14 | Disposition: A | Discharge status: Home / Self Care | Payer: Medicare HMO | Source: Ambulatory Visit | Attending: Family Medicine | Admitting: Family Medicine

## 2016-05-14 DIAGNOSIS — Z1231 Encounter for screening mammogram for malignant neoplasm of breast: Secondary | ICD-10-CM | POA: Diagnosis not present

## 2016-07-21 ENCOUNTER — Ambulatory Visit (INDEPENDENT_AMBULATORY_CARE_PROVIDER_SITE_OTHER): Payer: Medicare HMO | Admitting: Family Medicine

## 2016-07-21 ENCOUNTER — Ambulatory Visit (INDEPENDENT_AMBULATORY_CARE_PROVIDER_SITE_OTHER)
Admission: RE | Admit: 2016-07-21 | Discharge: 2016-07-21 | Disposition: A | Discharge status: Home / Self Care | Payer: Medicare HMO | Source: Ambulatory Visit | Attending: Family Medicine | Admitting: Family Medicine

## 2016-07-21 ENCOUNTER — Encounter: Payer: Self-pay | Admitting: Family Medicine

## 2016-07-21 VITALS — BP 130/82 | HR 96 | Temp 98.9°F | Wt 216.0 lb

## 2016-07-21 DIAGNOSIS — R05 Cough: Secondary | ICD-10-CM

## 2016-07-21 DIAGNOSIS — J189 Pneumonia, unspecified organism: Secondary | ICD-10-CM | POA: Diagnosis not present

## 2016-07-21 DIAGNOSIS — R059 Cough, unspecified: Secondary | ICD-10-CM

## 2016-07-21 MED ORDER — LEVOFLOXACIN 500 MG PO TABS: 500.0000 mg | ORAL_TABLET | Freq: Every day | ORAL | 0 refills | Status: DC

## 2016-07-21 NOTE — Patient Instructions (Signed)
Go to the lab on the way out.  We'll contact you with your xray report. Try the tramadol for cough.  Update [place] as needed.  Take care.  Glad to see you.

## 2016-07-21 NOTE — Progress Notes (Signed)
Cough.  Started about 1 month ago.  "I just felt yucky" to begin with.  No help with SABA.  Some days with sputum that is clear, some days with dry cough.  Coughing to the point of getting dizzy.  No fevers.  No vomiting.  No diarrhea.  No ear pain. No ST.  Mildly stuffy.  Mild to no runny nose.  Chest doesn't feel tight.  She feels like the cough is coming from her throat.  Noted hx of heartburn, worse recently, usually taking nexium every other day or so.  Tessalon didn't help.  Failed tx with delsym and other other OTC meds.  No help with codeine cough syrup.    PMH and SH reviewed with chronic use of methotrexate and Humira noted. Discussed with patient.  ROS: Per HPI unless specifically indicated in ROS section.  No FCNAV.    Meds, vitals, and allergies reviewed.   GEN: nad, alert and oriented HEENT: mucous membranes moist, tm w/o erythema, nasal exam w/o erythema, clear discharge noted,  OP with cobblestoning NECK: supple w/o LA CV: rrr.   PULM: ctab, no inc wob EXT: no edema SKIN: no acute rash

## 2016-07-22 DIAGNOSIS — J189 Pneumonia, unspecified organism: Secondary | ICD-10-CM | POA: Insufficient documentation

## 2016-07-22 NOTE — Assessment & Plan Note (Addendum)
Rationale for x-ray discussed with patient. Imaging personally reviewed. Pneumonia likely, especially given her history. Would treat given her sx. rx sent for levaquin. will have pt update [place] in a few days, sooner if worse. Will need f/u CXR in 6 weeks. I put in the order for that. FYI to PCP.  Patient is okay for outpatient f/u.  >25 minutes spent in face to face time with patient, >50% spent in counselling or coordination of care.

## 2016-08-16 ENCOUNTER — Encounter: Payer: Self-pay | Admitting: Family Medicine

## 2016-09-02 ENCOUNTER — Ambulatory Visit (INDEPENDENT_AMBULATORY_CARE_PROVIDER_SITE_OTHER)
Admission: RE | Admit: 2016-09-02 | Discharge: 2016-09-02 | Disposition: A | Discharge status: Home / Self Care | Payer: Medicare HMO | Source: Ambulatory Visit | Attending: Family Medicine | Admitting: Family Medicine

## 2016-09-02 DIAGNOSIS — R059 Cough, unspecified: Secondary | ICD-10-CM

## 2016-09-02 DIAGNOSIS — R05 Cough: Secondary | ICD-10-CM

## 2016-09-02 DIAGNOSIS — J189 Pneumonia, unspecified organism: Secondary | ICD-10-CM | POA: Diagnosis not present

## 2016-10-30 DIAGNOSIS — H35353 Cystoid macular degeneration, bilateral: Secondary | ICD-10-CM | POA: Diagnosis not present

## 2016-10-30 DIAGNOSIS — Z79899 Other long term (current) drug therapy: Secondary | ICD-10-CM | POA: Diagnosis not present

## 2016-10-30 DIAGNOSIS — H20023 Recurrent acute iridocyclitis, bilateral: Secondary | ICD-10-CM | POA: Diagnosis not present

## 2016-10-30 DIAGNOSIS — H20029 Recurrent acute iridocyclitis, unspecified eye: Secondary | ICD-10-CM | POA: Diagnosis not present

## 2016-10-30 DIAGNOSIS — Z961 Presence of intraocular lens: Secondary | ICD-10-CM | POA: Diagnosis not present

## 2016-11-02 ENCOUNTER — Encounter: Payer: Self-pay | Admitting: Family Medicine

## 2016-11-02 ENCOUNTER — Ambulatory Visit (INDEPENDENT_AMBULATORY_CARE_PROVIDER_SITE_OTHER): Payer: Medicare HMO | Admitting: Family Medicine

## 2016-11-02 VITALS — BP 126/78 | HR 75 | Temp 98.5°F | Ht 67.0 in | Wt 213.5 lb

## 2016-11-02 DIAGNOSIS — Z23 Encounter for immunization: Secondary | ICD-10-CM | POA: Diagnosis not present

## 2016-11-02 DIAGNOSIS — L989 Disorder of the skin and subcutaneous tissue, unspecified: Secondary | ICD-10-CM | POA: Diagnosis not present

## 2016-11-02 MED ORDER — MOMETASONE FUROATE 0.1 % EX CREA: 1.0000 "application " | TOPICAL_CREAM | Freq: Every day | CUTANEOUS | 1 refills | Status: DC

## 2016-11-02 NOTE — Assessment & Plan Note (Signed)
3-4 mm scabbed area- presumably from an insect bite a year ago -but unsure Will tx with mometasone cream (also keep clean and do not traumatize/pick/scratch)  If no imp needs derm eval (? If could be early skin cancer like squamous)  She will go ahead and make her routine skin screening appt with her dermatologist as well  Update if not starting to improve in a week or if worsening

## 2016-11-02 NOTE — Patient Instructions (Addendum)
Keep area clean with soap and water  Try not to scratch  Use the mometasone cream once daily until cleared   Go ahead and make your routine dermatology appt as well   Let us know if worse or not improving   Flu shot today

## 2016-11-02 NOTE — Progress Notes (Signed)
Subjective:    Patient ID: [name], female    DOB: 1965-12-07, 51 y.o.   MRN: [id number]  HPI Here for skin lesion- ? Spider bite from 1 year ago   On her R chest - looked different from a mosquito bite Still there  Still itches a lot  Briefly stopped itching when she was on abx for something else   Originally big and red- (dime size) - got scabby  No blisters that she remembers  Never had pus  No bruising  No fever or illness   Patient Active Problem List   Diagnosis Date Noted  . Skin lesion 11/02/2016  . Chronic low back pain 12/09/2015  . Degeneration of lumbar or lumbosacral intervertebral disc 12/09/2015  . Nasal septal deviation 01/03/2013  . Encounter for Medicare annual wellness exam 11/02/2012  . Hydradenitis 11/02/2012  . Hyperglycemia 03/23/2012  . Macular hole 03/23/2012  . Screening-pulmonary TB 03/23/2012  . Routine general medical examination at a health care facility 10/02/2011  . Iritis, recurrent 10/02/2011  . Tachycardia 10/02/2011  . ADD (attention deficit disorder) 08/20/2010  . DYSHIDROTIC ECZEMA, HANDS 11/12/2009  . Obstructive sleep apnea 11/19/2006  . DEPRESSION 09/29/2006  . Essential hypertension 09/29/2006  . Seasonal and perennial allergic rhinitis 09/29/2006  . Asthma, mild intermittent, well-controlled 09/29/2006  . GERD 09/29/2006  . IBS 09/29/2006  . ENDOMETRIOSIS 09/29/2006  . FEMALE INFERTILITY 09/29/2006  . Fibromyalgia 09/29/2006  . INSOMNIA 09/29/2006   Past Medical History:  Diagnosis Date  . Allergy    allergic rhinitis  . Asthma   . Chronic headaches   . Degenerative disc disease   . Depression   . Fibromyalgia   . Fx of fibula 2009  . GERD (gastroesophageal reflux disease)   . Hemorrhoids   . Hypertension   . IBS (irritable bowel syndrome)   . Iritis, chronic   . Macular hole of right eye   . Sleep apnea    Past Surgical History:  Procedure Laterality Date  . ABDOMINAL HYSTERECTOMY    . ANTERIOR  CRUCIATE LIGAMENT REPAIR  1996   small tear of ACL  . BREAST EXCISIONAL BIOPSY Left 1995  . BREAST SURGERY     left nipple removed - benign tumor  . BUNIONECTOMY    . LAPAROSCOPY  08/2004   endometriosis - several   . TONSILLECTOMY     Social History  Substance Use Topics  . Smoking status: Never Smoker  . Smokeless tobacco: Never Used  . Alcohol use No   Family History  Problem Relation Age of Onset  . Hypertension Mother   . Thyroid disease Mother   . Fibromyalgia Mother   . Colon polyps Mother   . Diabetes Father   . Kidney cancer Maternal Grandmother   . Colon cancer Neg Hx    Allergies  Allergen Reactions  . Cetirizine Hcl     REACTION: not effective  . Erythromycin     REACTION: stomach hurts  . Escitalopram Oxalate     REACTION: no improvement  . Lisinopril     REACTION: cough  . Loratadine     REACTION: not effective  . Nifedipine     REACTION: fatigued and tremor  . Sertraline Hcl     REACTION: no help  . Vyvanse [Lisdexamfetamine Dimesylate]     Headache and excitablility   Current Outpatient Prescriptions on File Prior to Visit  Medication Sig Dispense Refill  . acetaminophen (TYLENOL) 500 MG tablet OTC as directed.     Marland Kitchen  Adalimumab (HUMIRA) 40 MG/0.8ML PSKT Inject 40 mg into the skin.     Marland Kitchen albuterol (PROAIR HFA) 108 (90 Base) MCG/ACT inhaler Inhale 2 puffs, every 6 hours if needed for asthma 9 each 3  . buPROPion (WELLBUTRIN XL) 300 MG 24 hr tablet Take 1 tablet (300 mg total) by mouth daily. 90 tablet 3  . carvedilol (COREG) 6.25 MG tablet Take 1 tablet (6.25 mg total) by mouth 2 (two) times daily with a meal. 180 tablet 3  . Cetirizine HCl (ZYRTEC ALLERGY PO) Take 10 mg by mouth as needed.    . Cholecalciferol (VITAMIN D3 PO) Take 1 capsule by mouth daily.    . [employer]S TRIPLE MAGNESIUM COMPLEX PO Take 1 capsule by mouth daily.    . diphenhydrAMINE (BENADRYL) 25 mg capsule Take 25 mg by mouth as needed. OTC as directed.     Marland Kitchen esomeprazole (NEXIUM) 20  MG capsule Take 1 capsule (20 mg total) by mouth daily at 12 noon. 90 capsule 3  . folic acid (FOLVITE) 1 MG tablet Take 3 mg by mouth 4 (four) times a week.     Marland Kitchen ibuprofen (ADVIL,MOTRIN) 200 MG tablet Take 200 mg by mouth every 6 (six) hours as needed.    Marland Kitchen KETOROLAC TROMETHAMINE OP Place 4 drops into both eyes daily.    Marland Kitchen leucovorin (WELLCOVORIN) 5 MG tablet Take 5 mg by mouth 2 (two) times a week.    Marland Kitchen levofloxacin (LEVAQUIN) 500 MG tablet Take 1 tablet (500 mg total) by mouth daily. 7 tablet 0  . losartan (COZAAR) 50 MG tablet Take 1 tablet (50 mg total) by mouth daily. 90 tablet 3  . methocarbamol (ROBAXIN) 500 MG tablet TAKE 1 TABLET (500 MG TOTAL) BY MOUTH AT BEDTIME AS NEEDED. 30 tablet 3  . methotrexate 50 MG/2ML injection Inject 25 mg into the vein once a week.    . montelukast (SINGULAIR) 10 MG tablet Take 1 tablet (10 mg total) by mouth at bedtime. (Patient taking differently: Take 10 mg by mouth as needed. ) 90 tablet 3  . prednisoLONE acetate (PRED FORTE) 1 % ophthalmic suspension Place 4 drops into both eyes daily.     . traMADol (ULTRAM) 50 MG tablet TAKE 1 TABLET (50 MG TOTAL) BY MOUTH EVERY 8 (EIGHT) HOURS AS NEEDED FOR PAIN. 30 tablet 3   No current facility-administered medications on file prior to visit.      Review of Systems  Constitutional: Negative for activity change, appetite change, fatigue, fever and unexpected weight change.  HENT: Negative for congestion, ear pain, rhinorrhea, sinus pressure and sore throat.   Eyes: Negative for pain, redness and visual disturbance.  Respiratory: Negative for cough, shortness of breath and wheezing.   Cardiovascular: Negative for chest pain and palpitations.  Gastrointestinal: Negative for abdominal pain, blood in stool, constipation and diarrhea.  Endocrine: Negative for polydipsia and polyuria.  Genitourinary: Negative for dysuria, frequency and urgency.  Musculoskeletal: Positive for arthralgias. Negative for back pain and  myalgias.       Baseline arthralgias -not new  Skin: Negative for color change, pallor and rash.  Allergic/Immunologic: Negative for environmental allergies.  Neurological: Negative for dizziness, syncope and headaches.  Hematological: Negative for adenopathy. Does not bruise/bleed easily.  Psychiatric/Behavioral: Negative for decreased concentration and dysphoric mood. The patient is not nervous/anxious.        Objective:   Physical Exam  Constitutional: She appears well-developed and well-nourished. No distress.  obese and well appearing   HENT:  Head: Normocephalic and atraumatic.  Eyes: Pupils are equal, round, and reactive to light. Conjunctivae and EOM are normal.  Neck: Normal range of motion. Neck supple.  Cardiovascular: Normal rate, regular rhythm and normal heart sounds.   Lymphadenopathy:    She has no cervical adenopathy.  Skin: Skin is warm and dry. No rash noted. No erythema. No pallor.  3-4 mm area of scab (non infected looking) on R chest  No erythema or drainage   Solar lentigines diffusely   Psychiatric: She has a normal mood and affect.          Assessment & Plan:   Problem List Items Addressed This Visit      Musculoskeletal and Integument   Skin lesion    3-4 mm scabbed area- presumably from an insect bite a year ago -but unsure Will tx with mometasone cream (also keep clean and do not traumatize/pick/scratch)  If no imp needs derm eval (? If could be early skin cancer like squamous)  She will go ahead and make her routine skin screening appt with her dermatologist as well  Update if not starting to improve in a week or if worsening         Other Visit Diagnoses    Need for influenza vaccination    -  Primary   Relevant Orders   Flu Vaccine QUAD 6+ mos PF IM (Fluarix Quad PF) (Completed)

## 2016-11-11 ENCOUNTER — Encounter: Payer: Self-pay | Admitting: Family Medicine

## 2016-11-11 DIAGNOSIS — L918 Other hypertrophic disorders of the skin: Secondary | ICD-10-CM | POA: Diagnosis not present

## 2016-11-11 DIAGNOSIS — B070 Plantar wart: Secondary | ICD-10-CM | POA: Diagnosis not present

## 2016-11-11 DIAGNOSIS — C44519 Basal cell carcinoma of skin of other part of trunk: Secondary | ICD-10-CM | POA: Diagnosis not present

## 2016-11-11 DIAGNOSIS — D2271 Melanocytic nevi of right lower limb, including hip: Secondary | ICD-10-CM | POA: Diagnosis not present

## 2016-11-11 DIAGNOSIS — L821 Other seborrheic keratosis: Secondary | ICD-10-CM | POA: Diagnosis not present

## 2016-11-11 DIAGNOSIS — D2261 Melanocytic nevi of right upper limb, including shoulder: Secondary | ICD-10-CM | POA: Diagnosis not present

## 2016-11-11 DIAGNOSIS — D2239 Melanocytic nevi of other parts of face: Secondary | ICD-10-CM | POA: Diagnosis not present

## 2016-11-11 DIAGNOSIS — D225 Melanocytic nevi of trunk: Secondary | ICD-10-CM | POA: Diagnosis not present

## 2016-11-11 DIAGNOSIS — D1801 Hemangioma of skin and subcutaneous tissue: Secondary | ICD-10-CM | POA: Diagnosis not present

## 2016-11-27 IMAGING — CR DG FOOT COMPLETE 3+V*R*
3 series · 3 of 3 positions shown · non-contrast
Comparison: 02/02/2006

CLINICAL DATA: RIGHT foot pain after injury 10 days ago, jammed
foot, pain between first and second toes, lateral foot swelling,
prior bunion surgery

EXAM:
RIGHT FOOT COMPLETE - 3+ VIEW

[view not recorded (1 of 3)]
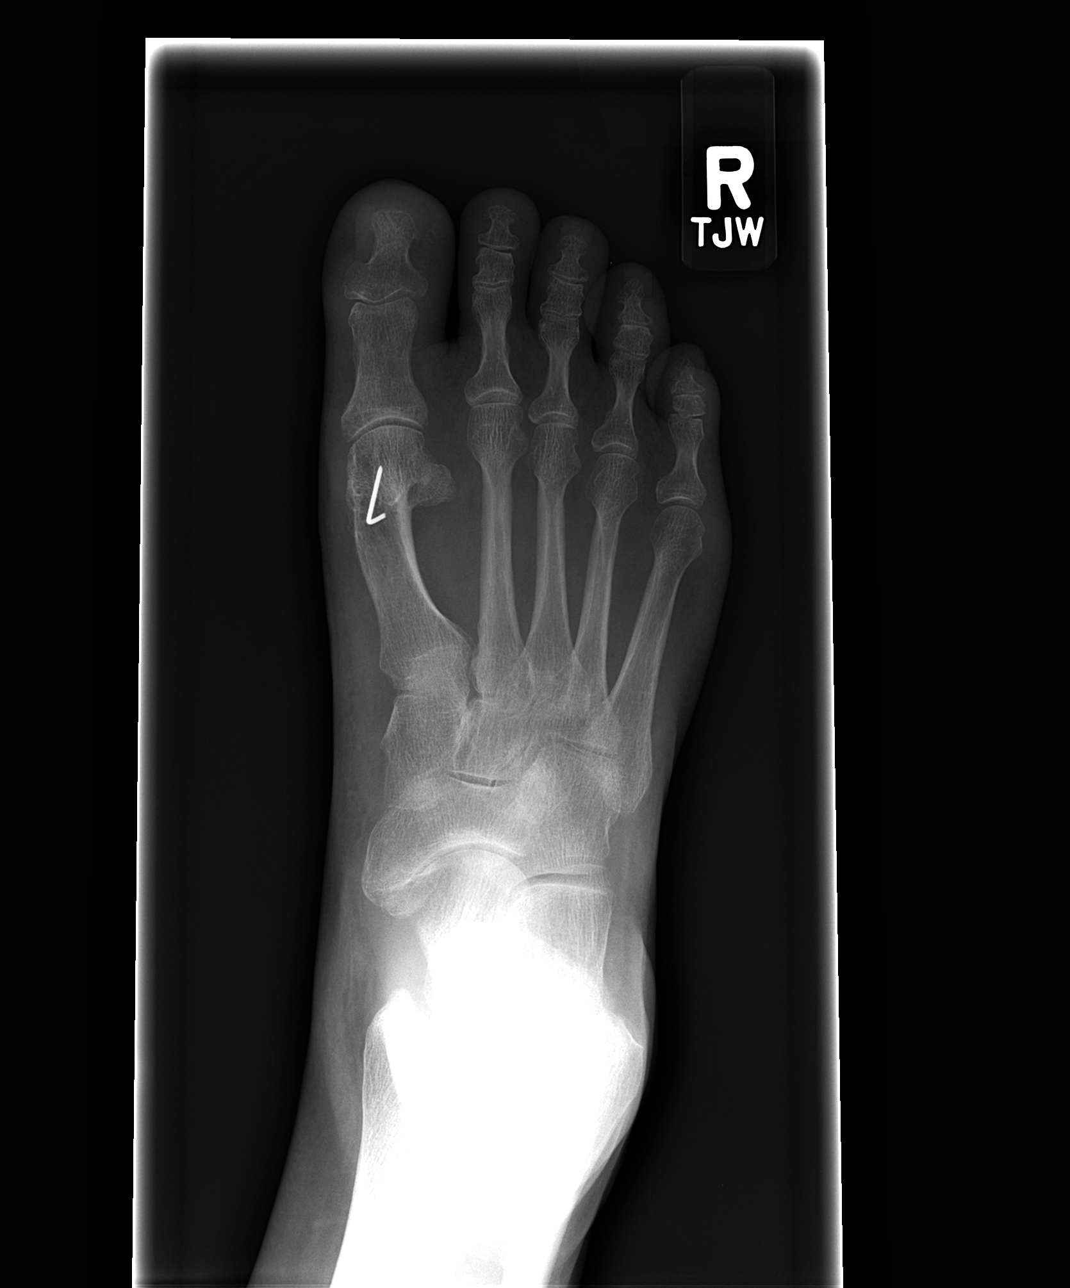

[view not recorded (2 of 3)]
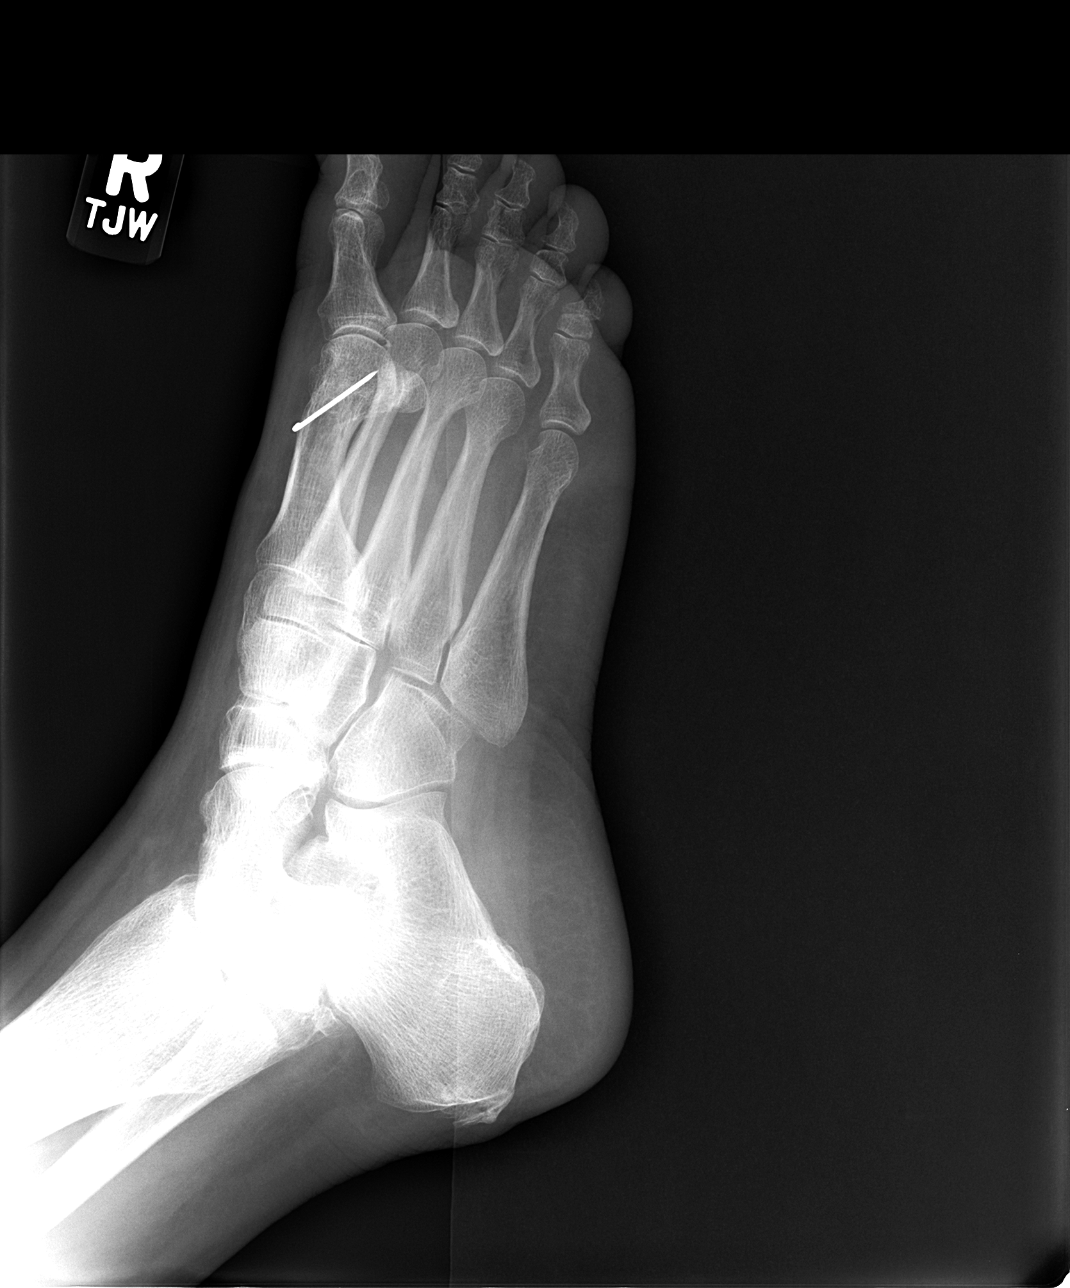

[view not recorded (3 of 3)]
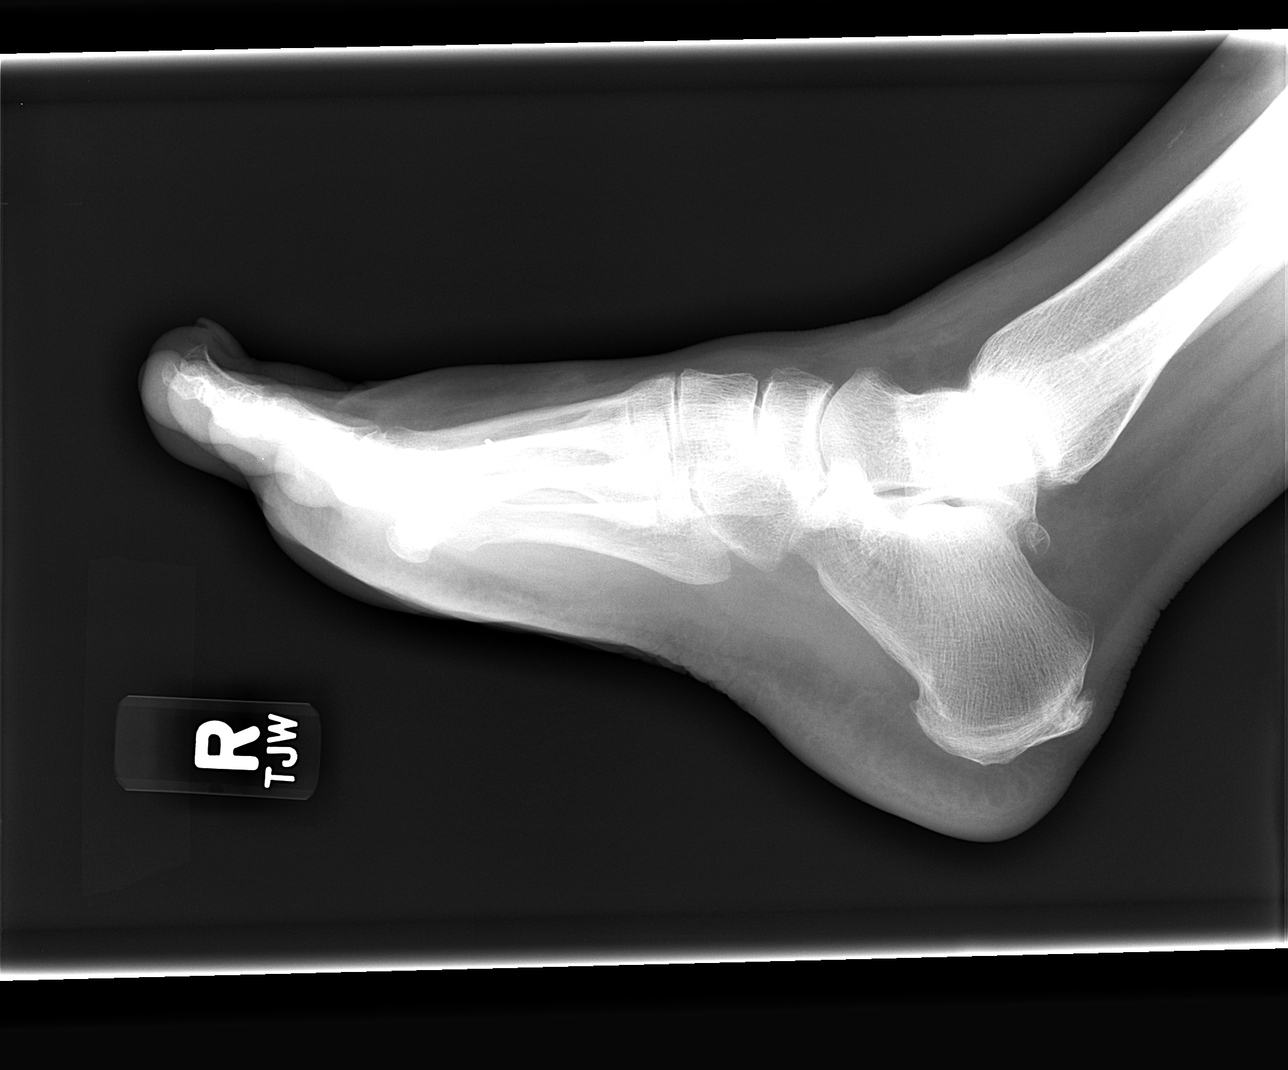

[3 of 3 positions shown; findings below may reference images not displayed]

FINDINGS: K-wire distal first metatarsal post distal first metatarsal
osteotomy and bunionectomy.

Osseous demineralization.

Minimal degenerative changes first MTP joint.

No acute fracture, dislocation, or bone destruction.

Mild dorsal soft tissue swelling overlying the distal metatarsal
level.

Small Achilles insertion calcaneal spur.
IMPRESSION: Osseous demineralization with postsurgical changes of the distal
first metatarsal.

Minimal degenerative changes first MTP joint.

No acute abnormalities.

## 2016-11-28 ENCOUNTER — Ambulatory Visit: Payer: Medicare HMO | Admitting: Family Medicine

## 2016-11-28 ENCOUNTER — Encounter: Payer: Self-pay | Admitting: Family Medicine

## 2016-11-28 VITALS — BP 110/72 | HR 84 | Temp 99.1°F | Resp 14 | Ht 67.0 in | Wt 217.0 lb

## 2016-11-28 DIAGNOSIS — J208 Acute bronchitis due to other specified organisms: Secondary | ICD-10-CM | POA: Diagnosis not present

## 2016-11-28 DIAGNOSIS — B9689 Other specified bacterial agents as the cause of diseases classified elsewhere: Secondary | ICD-10-CM | POA: Diagnosis not present

## 2016-11-28 MED ORDER — BENZONATATE 100 MG PO CAPS: 100.0000 mg | ORAL_CAPSULE | Freq: Three times a day (TID) | ORAL | 0 refills | Status: DC | PRN

## 2016-11-28 MED ORDER — AZITHROMYCIN 250 MG PO TABS: ORAL_TABLET | ORAL | 0 refills | Status: DC

## 2016-11-28 NOTE — Progress Notes (Signed)
Pre visit review using our clinic review tool, if applicable. No additional management support is needed unless otherwise documented below in the visit note. 

## 2016-11-28 NOTE — Patient Instructions (Signed)
Continue to push fluids, practice good hand hygiene, and cover your mouth if you cough.  If you start having fevers, shaking or shortness of breath, seek immediate care.  Wait 2-3 days before taking antibiotic if you aren't getting better. If you are getting worse, take it.   Let us know if you need anything.

## 2016-11-28 NOTE — Progress Notes (Signed)
Chief Complaint  Patient presents with  . sinus pain and pressure    ear congestion, cough, ear congestion s 1 week     [name] here for URI complaints.  Duration: 1 week  Associated symptoms: sinus congestion, rhinorrhea, ear pain, sore throat, chest tightness and cough Denies: sinus pain, ear drainage, itchy/watery eyes, myalgias, fever/rigors, shortness of breath Treatment to date: Mucinex, Advil, Tylenol Sick contacts: No  ROS:  Const: Denies fevers HEENT: As noted in HPI Lungs: No SOB  Past Medical History:  Diagnosis Date  . Allergy    allergic rhinitis  . Asthma   . Chronic headaches   . Degenerative disc disease   . Depression   . Fibromyalgia   . Fx of fibula 2009  . GERD (gastroesophageal reflux disease)   . Hemorrhoids   . Hypertension   . IBS (irritable bowel syndrome)   . Iritis, chronic   . Macular hole of right eye   . Sleep apnea    Family History  Problem Relation Age of Onset  . Hypertension Mother   . Thyroid disease Mother   . Fibromyalgia Mother   . Colon polyps Mother   . Diabetes Father   . Kidney cancer Maternal Grandmother   . Colon cancer Neg Hx     BP 110/72 (BP Location: Left Arm, Patient Position: Sitting, Cuff Size: Large)   Pulse 84   Temp 99.1 F (37.3 C) (Oral)   Resp 14   Ht 5\' 7"  (1.702 m)   Wt 217 lb (98.4 kg)   LMP 08/05/2006   SpO2 99%   BMI 33.99 kg/m  General: Awake, alert, appears stated age 22: AT, [place], ears patent b/l and TM's neg, sinuses are not tender to palpation, nares patent w/o discharge, pharynx pink and without exudates, MMM Neck: No masses or asymmetry Heart: RRR, no murmurs, no bruits Lungs: CTAB, no accessory muscle use Psych: Age appropriate judgment and insight, normal mood and affect  Acute bacterial bronchitis  Tessalon Perles and supportive care for the next several days.  If not improving or worsening, a Z-Pak was also called in.  This was done given her use of methotrexate and  monoclonal antibody. Continue to push fluids, practice good hand hygiene, cover mouth when coughing. F/u prn. If starting to experience fevers, shaking, or shortness of breath, seek immediate care. Pt voiced understanding and agreement to the plan.  [place], DO 11/28/16 12:01 PM

## 2016-12-07 ENCOUNTER — Ambulatory Visit: Payer: Medicare HMO | Admitting: Internal Medicine

## 2016-12-07 ENCOUNTER — Encounter: Payer: Self-pay | Admitting: Internal Medicine

## 2016-12-07 DIAGNOSIS — G4733 Obstructive sleep apnea (adult) (pediatric): Secondary | ICD-10-CM

## 2016-12-07 DIAGNOSIS — J4520 Mild intermittent asthma, uncomplicated: Secondary | ICD-10-CM | POA: Diagnosis not present

## 2016-12-07 MED ORDER — ALBUTEROL SULFATE HFA 108 (90 BASE) MCG/ACT IN AERS: 2.0000 | INHALATION_SPRAY | Freq: Four times a day (QID) | RESPIRATORY_TRACT | 12 refills | Status: DC | PRN

## 2016-12-07 MED ORDER — MONTELUKAST SODIUM 10 MG PO TABS: 10.0000 mg | ORAL_TABLET | Freq: Every day | ORAL | 3 refills | Status: DC

## 2016-12-07 NOTE — Progress Notes (Signed)
Patient ID: [name], female    DOB: 1965-05-18, 51 y.o.   MRN: [id number]  HPI F followed for obstructive sleep apnea and allergic rhinitis, asthma, complicated by insomnia, GERD, depression. NPSG 10/08/06 AHI 33/hr.  ------------------------------------------ 12/16/2015-51 year old female never smoker followed for OSA, Allergic rhinitis, Asthma, complicated by insomnia, GERD, depression, chronic iritis/[facility]/methotrexate CPAP 11/ [facility] Allergy vaccine 1:50 GH Follows for pt still on vaccine, [facility] sent over download Download excellent compliance 99%/4 hours and excellent control AHI 1.7/hour. She says she can't sleep without her CPAP now. Very comfortable with no acute needs. Asthma has been very well controlled with no breakthroughs. Med refills discussed. Infrequent use of rescue inhaler and no sleep disturbance. Sneezing early in the fall, now resolved.  12/07/16 - 51 year old female never smoker followed for OSA, Allergic rhinitis, Asthma, complicated by insomnia, GERD, depression, chronic iritis/[facility]/methotrexate/ Humira CPAP 11/[facility] Download 99% compliance, AHI 1.1/hour ---Asthma; Proventil is no longer covered by Insurance-will need to change. OSA; DME: [facility]. DL attached and needs order for new supplies.  She has been using rescue inhaler about twice a week without need for maintenance controller and with no sleep disturbance. Now on Humira and injected methotrexate for chronic iritis followed at [facility].  Review of Systems- see HPI Constitutional:   No-   weight loss, night sweats, fevers, chills, fatigue, lassitude. HEENT:   No-  headaches, difficulty swallowing, tooth/dental problems, sore throat,       No- sneezing, itching, ear ache, +nasal congestion, little post nasal drip,  CV:  No-   chest pain, orthopnea, PND, swelling in lower extremities, anasarca, dizziness, palpitations Resp: No-   shortness of breath with exertion or at rest.              No-   productive  cough, occasional non-productive cough,  No- coughing up of blood.              No-   change in color of mucus.  No- wheezing.   Skin: No-   rash or lesions. GI:  No-   heartburn, indigestion, abdominal pain, nausea, vomiting,  GU: MS:   Neuro-     nothing unusual Psych:  No- change in mood or affect. No depression or anxiety.  No memory loss.  Objective:   Physical Exam General- Alert, Oriented, Affect-appropriate, Distress- none acute, + Overweight Skin- +keratosis pilaris Lymphadenopathy- none Head- atraumatic            Eyes- Gross vision intact, PERRLA, conjunctivae clear secretions            Ears- Hearing, canals-normal            Nose- mucosa + clear, turbinate edema, +Septal dev,no- polyps, erosion, perforation             Throat- Mallampati II-III , mucosa clear , drainage- none, tonsils- atrophic Neck- flexible , trachea midline, no stridor , thyroid nl, carotid no bruit Chest - symmetrical excursion , unlabored           Heart/CV- RRR , no murmur , no gallop  , no rub, nl s1 s2                           - JVD- none , edema- none, stasis changes- none, varices- none           Lung- clear to P&A, wheeze- none, cough- none  , dullness-none, rub- none  Chest wall-  Abd-  Br/ Gen/ Rectal- Not done, not indicated Extrem- cyanosis- none, clubbing, none, atrophy- none, strength- nl Neuro- grossly intact to observation

## 2016-12-07 NOTE — Patient Instructions (Signed)
Refills sent for albuterol inhaler (Ventolin) and Singulair  Order- [facility]- please replace old CPAP machine with broken charger, change to auto 5-15, continue mask of choice, humidifier,  Supplies, AirView     Dx OSA     She will need new supplies now.   Dx OSA    Please call as needed

## 2016-12-07 NOTE — Assessment & Plan Note (Signed)
She still feels that she cannot sleep without CPAP.  Machine is old and having mechanical problems.  This would be a good time to change to AutoPap as she gets a new machine. Plan-replace old CPAP machine, changing to AutoPap 5-15

## 2016-12-07 NOTE — Assessment & Plan Note (Signed)
She continues very well controlled using rescue inhaler appropriately. Plan-refill for albuterol rescue inhaler consistent with her insurance coverage.

## 2017-01-04 ENCOUNTER — Ambulatory Visit (INDEPENDENT_AMBULATORY_CARE_PROVIDER_SITE_OTHER)
Admission: RE | Admit: 2017-01-04 | Discharge: 2017-01-04 | Disposition: A | Discharge status: Home / Self Care | Payer: Medicare HMO | Source: Ambulatory Visit | Attending: Family Medicine | Admitting: Family Medicine

## 2017-01-04 ENCOUNTER — Ambulatory Visit: Payer: Medicare HMO | Admitting: Family Medicine

## 2017-01-04 ENCOUNTER — Encounter: Payer: Self-pay | Admitting: Family Medicine

## 2017-01-04 VITALS — BP 126/80 | HR 88 | Temp 99.2°F | Ht 67.0 in | Wt 215.5 lb

## 2017-01-04 DIAGNOSIS — S8012XA Contusion of left lower leg, initial encounter: Secondary | ICD-10-CM | POA: Diagnosis not present

## 2017-01-04 DIAGNOSIS — S299XXA Unspecified injury of thorax, initial encounter: Secondary | ICD-10-CM | POA: Diagnosis not present

## 2017-01-04 DIAGNOSIS — R0789 Other chest pain: Secondary | ICD-10-CM

## 2017-01-04 MED ORDER — TRAMADOL HCL 50 MG PO TABS: ORAL_TABLET | ORAL | 3 refills | Status: DC

## 2017-01-04 MED ORDER — CYCLOBENZAPRINE HCL 10 MG PO TABS: 10.0000 mg | ORAL_TABLET | Freq: Every evening | ORAL | 0 refills | Status: DC | PRN

## 2017-01-04 NOTE — Progress Notes (Signed)
Subjective:    Patient ID: [name], female    DOB: 1965-05-10, 51 y.o.   MRN: [id number]  HPI Here for rib pain   She fell on 12/19  Walking in a parking lot- tripped over a cement embankment  [place] hard  L side hit the ground  Bruises/scrapes  Very sore L lower ribs Hurts to take deep breath or cough or sneeze  Not getting better  Turning the steering wheel   Tried heat Tried ice  Used what is left of tramadol  Ins will not cover methocarbamol -few left   Last few days - little dry cough   Wt Readings from Last 3 Encounters:  01/04/17 215 lb 8 oz (97.8 kg)  12/07/16 215 lb (97.5 kg)  11/28/16 217 lb (98.4 kg)   33.75 kg/m   Patient Active Problem List   Diagnosis Date Noted  . Chest wall pain 01/04/2017  . Skin lesion 11/02/2016  . Chronic low back pain 12/09/2015  . Degeneration of lumbar or lumbosacral intervertebral disc 12/09/2015  . Nasal septal deviation 01/03/2013  . Encounter for Medicare annual wellness exam 11/02/2012  . Hydradenitis 11/02/2012  . Hyperglycemia 03/23/2012  . Macular hole 03/23/2012  . Screening-pulmonary TB 03/23/2012  . Routine general medical examination at a health care facility 10/02/2011  . Iritis, recurrent 10/02/2011  . Tachycardia 10/02/2011  . ADD (attention deficit disorder) 08/20/2010  . DYSHIDROTIC ECZEMA, HANDS 11/12/2009  . Obstructive sleep apnea 11/19/2006  . DEPRESSION 09/29/2006  . Essential hypertension 09/29/2006  . Seasonal and perennial allergic rhinitis 09/29/2006  . Asthma, mild intermittent, well-controlled 09/29/2006  . GERD 09/29/2006  . IBS 09/29/2006  . ENDOMETRIOSIS 09/29/2006  . FEMALE INFERTILITY 09/29/2006  . Fibromyalgia 09/29/2006  . INSOMNIA 09/29/2006   Past Medical History:  Diagnosis Date  . Allergy    allergic rhinitis  . Asthma   . Chronic headaches   . Degenerative disc disease   . Depression   . Fibromyalgia   . Fx of fibula 2009  . GERD (gastroesophageal reflux  disease)   . Hemorrhoids   . Hypertension   . IBS (irritable bowel syndrome)   . Iritis, chronic   . Macular hole of right eye   . Sleep apnea    Past Surgical History:  Procedure Laterality Date  . ABDOMINAL HYSTERECTOMY    . ANTERIOR CRUCIATE LIGAMENT REPAIR  1996   small tear of ACL  . BREAST EXCISIONAL BIOPSY Left 1995  . BREAST SURGERY     left nipple removed - benign tumor  . BUNIONECTOMY    . LAPAROSCOPY  08/2004   endometriosis - several   . TONSILLECTOMY     Social History   Tobacco Use  . Smoking status: Never Smoker  . Smokeless tobacco: Never Used  Substance Use Topics  . Alcohol use: No    Alcohol/week: 0.0 oz  . Drug use: No   Family History  Problem Relation Age of Onset  . Hypertension Mother   . Thyroid disease Mother   . Fibromyalgia Mother   . Colon polyps Mother   . Diabetes Father   . Kidney cancer Maternal Grandmother   . Colon cancer Neg Hx    Allergies  Allergen Reactions  . Cetirizine Hcl     REACTION: not effective  . Erythromycin     REACTION: stomach hurts  . Escitalopram Oxalate     REACTION: no improvement  . Lisinopril     REACTION: cough  .  Loratadine     REACTION: not effective  . Nifedipine     REACTION: fatigued and tremor  . Sertraline Hcl     REACTION: no help  . Vyvanse [Lisdexamfetamine Dimesylate]     Headache and excitablility   Current Outpatient Medications on File Prior to Visit  Medication Sig Dispense Refill  . acetaminophen (TYLENOL) 500 MG tablet OTC as directed.     . Adalimumab (HUMIRA) 40 MG/0.8ML PSKT Inject 40 mg into the skin.     Marland Kitchen albuterol (PROVENTIL HFA;VENTOLIN HFA) 108 (90 Base) MCG/ACT inhaler Inhale 2 puffs into the lungs every 6 (six) hours as needed for wheezing or shortness of breath. 1 Inhaler 12  . buPROPion (WELLBUTRIN XL) 300 MG 24 hr tablet Take 1 tablet (300 mg total) by mouth daily. 90 tablet 3  . carvedilol (COREG) 6.25 MG tablet Take 1 tablet (6.25 mg total) by mouth 2 (two)  times daily with a meal. 180 tablet 3  . Cetirizine HCl (ZYRTEC ALLERGY PO) Take 10 mg by mouth as needed.    . Cholecalciferol (VITAMIN D3 PO) Take 1 capsule by mouth daily.    . [employer] TRIPLE MAGNESIUM COMPLEX PO Take 1 capsule by mouth daily.    . diphenhydrAMINE (BENADRYL) 25 mg capsule Take 25 mg by mouth as needed. OTC as directed.     Marland Kitchen esomeprazole (NEXIUM) 20 MG capsule Take 1 capsule (20 mg total) by mouth daily at 12 noon. 90 capsule 3  . folic acid (FOLVITE) 1 MG tablet Take 3 mg by mouth 4 (four) times a week.     Marland Kitchen ibuprofen (ADVIL,MOTRIN) 200 MG tablet Take 200 mg by mouth every 6 (six) hours as needed.    Marland Kitchen KETOROLAC TROMETHAMINE OP Place 4 drops into both eyes daily.    Marland Kitchen leucovorin (WELLCOVORIN) 5 MG tablet Take 5 mg by mouth 2 (two) times a week.    . losartan (COZAAR) 50 MG tablet Take 1 tablet (50 mg total) by mouth daily. 90 tablet 3  . methotrexate 50 MG/2ML injection Inject 25 mg into the vein once a week.    . methotrexate 50 MG/2ML injection Inject into the skin.    . mometasone (ELOCON) 0.1 % cream Apply 1 application topically daily. To affected area /bug bite 15 g 1  . montelukast (SINGULAIR) 10 MG tablet Take 1 tablet (10 mg total) by mouth at bedtime. 90 tablet 3  . prednisoLONE acetate (PRED FORTE) 1 % ophthalmic suspension Place 4 drops into both eyes daily.     . benzonatate (TESSALON) 100 MG capsule Take 1 capsule (100 mg total) by mouth 3 (three) times daily as needed. (Patient not taking: Reported on 01/04/2017) 30 capsule 0   No current facility-administered medications on file prior to visit.      Review of Systems  Constitutional: Negative for activity change, appetite change, fatigue, fever and unexpected weight change.  HENT: Negative for congestion, ear pain, rhinorrhea, sinus pressure and sore throat.   Eyes: Negative for pain, redness and visual disturbance.  Respiratory: Negative for cough, chest tightness, shortness of breath and wheezing.     Cardiovascular: Negative for chest pain and palpitations.  Gastrointestinal: Negative for abdominal pain, blood in stool, constipation and diarrhea.  Endocrine: Negative for polydipsia and polyuria.  Genitourinary: Negative for dysuria, frequency and urgency.  Musculoskeletal: Negative for arthralgias, back pain and myalgias.       Pos for left sided chest wall pain with inspiration  Skin: Negative for pallor  and rash.  Allergic/Immunologic: Negative for environmental allergies.  Neurological: Negative for dizziness, syncope and headaches.  Hematological: Negative for adenopathy. Does not bruise/bleed easily.  Psychiatric/Behavioral: Negative for decreased concentration and dysphoric mood. The patient is not nervous/anxious.        Objective:   Physical Exam  Constitutional: She appears well-developed and well-nourished. No distress.  obese and well appearing   HENT:  Head: Normocephalic and atraumatic.  Eyes: Conjunctivae and EOM are normal. Pupils are equal, round, and reactive to light. No scleral icterus.  Neck: Normal range of motion. Neck supple.  Cardiovascular: Normal rate, regular rhythm and normal heart sounds.  Pulmonary/Chest: Effort normal and breath sounds normal. No respiratory distress. She has no wheezes. She has no rales. She exhibits tenderness.  L low ant/lat chest wall tenderness without crepitus or skin change Good air exch  No dull areas  No rales/rhonchi or crackles   Abdominal: Soft. Bowel sounds are normal. She exhibits no distension. There is no tenderness.  Musculoskeletal:  Patchy ecchymosis over L lateral leg - some resolving  No areas of deformity or skin breakdown   Lymphadenopathy:    She has no cervical adenopathy.  Neurological: She is alert. She has normal reflexes. No cranial nerve deficit. She exhibits normal muscle tone. Coordination normal.  Skin: Skin is warm and dry. No rash noted. No erythema.  Psychiatric: She has a normal mood and  affect.          Assessment & Plan:   Problem List Items Addressed This Visit      Other   Chest wall pain - Primary    S/p trauma from fall  Disc symptomatic tx with nsaid and tramadol (with caution)  Also muscle relaxer -flexeril at bedtime as needed Warm compress Disc avoidance of atelectasis with deep breaths (holding pillow) Watch for cough or fever and update  Xray today to r/o rib fx       Relevant Orders   DG Ribs Unilateral Left (Completed)   Multiple leg contusions    From recent trip and fall in parking lot  No open areas or signs of infection  Recommend analgesics/ice and heat prn

## 2017-01-04 NOTE — Patient Instructions (Addendum)
To prevent pneumonia- grab a pillow and take deep breaths several times per hour   Tramadol for pain  Flexeril for muscle spasm at night (cyclobenzaprine)  meloxicam 15 mg with food once daily is ok (do not mix with advil/aleve or other nsaids) and take with food   Heat helps too   Xray now - we will let you know when you get results

## 2017-01-05 DIAGNOSIS — S8010XA Contusion of unspecified lower leg, initial encounter: Secondary | ICD-10-CM | POA: Insufficient documentation

## 2017-01-05 NOTE — Assessment & Plan Note (Signed)
From recent trip and fall in parking lot  No open areas or signs of infection  Recommend analgesics/ice and heat prn

## 2017-01-05 NOTE — Assessment & Plan Note (Signed)
S/p trauma from fall  Disc symptomatic tx with nsaid and tramadol (with caution)  Also muscle relaxer -flexeril at bedtime as needed Warm compress Disc avoidance of atelectasis with deep breaths (holding pillow) Watch for cough or fever and update  Xray today to r/o rib fx

## 2017-02-12 DIAGNOSIS — B070 Plantar wart: Secondary | ICD-10-CM | POA: Diagnosis not present

## 2017-02-12 DIAGNOSIS — Z85828 Personal history of other malignant neoplasm of skin: Secondary | ICD-10-CM | POA: Diagnosis not present

## 2017-02-26 DIAGNOSIS — H20023 Recurrent acute iridocyclitis, bilateral: Secondary | ICD-10-CM | POA: Diagnosis not present

## 2017-02-26 DIAGNOSIS — H35353 Cystoid macular degeneration, bilateral: Secondary | ICD-10-CM | POA: Diagnosis not present

## 2017-02-26 DIAGNOSIS — Z961 Presence of intraocular lens: Secondary | ICD-10-CM | POA: Diagnosis not present

## 2017-02-26 DIAGNOSIS — H2013 Chronic iridocyclitis, bilateral: Secondary | ICD-10-CM | POA: Diagnosis not present

## 2017-02-26 DIAGNOSIS — Z79899 Other long term (current) drug therapy: Secondary | ICD-10-CM | POA: Diagnosis not present

## 2017-02-26 DIAGNOSIS — H20029 Recurrent acute iridocyclitis, unspecified eye: Secondary | ICD-10-CM | POA: Diagnosis not present

## 2017-03-03 ENCOUNTER — Encounter: Payer: Self-pay | Admitting: Family Medicine

## 2017-03-03 DIAGNOSIS — R7989 Other specified abnormal findings of blood chemistry: Secondary | ICD-10-CM | POA: Insufficient documentation

## 2017-03-03 NOTE — Telephone Encounter (Signed)
Lab appt scheduled.

## 2017-03-03 NOTE — Telephone Encounter (Signed)
I put a future order in to check renal panel in 2 weeks Please call pt and let her know -make a non fasting lab appt then Thanks

## 2017-03-22 DIAGNOSIS — H26492 Other secondary cataract, left eye: Secondary | ICD-10-CM | POA: Diagnosis not present

## 2017-03-23 ENCOUNTER — Other Ambulatory Visit (INDEPENDENT_AMBULATORY_CARE_PROVIDER_SITE_OTHER): Payer: Medicare HMO

## 2017-03-23 DIAGNOSIS — R7989 Other specified abnormal findings of blood chemistry: Secondary | ICD-10-CM

## 2017-03-23 LAB — RENAL FUNCTION PANEL
ALBUMIN: 4 g/dL (ref 3.5–5.2)
BUN: 16 mg/dL (ref 6–23)
CHLORIDE: 103 meq/L (ref 96–112)
CO2: 28 mEq/L (ref 19–32)
Calcium: 9.6 mg/dL (ref 8.4–10.5)
Creatinine, Ser: 0.88 mg/dL (ref 0.40–1.20)
GFR: 72.36 mL/min (ref >60.00)
Glucose, Bld: 124 mg/dL — ABNORMAL HIGH (ref 70–99)
PHOSPHORUS: 3.2 mg/dL (ref 2.3–4.6)
POTASSIUM: 3.8 meq/L (ref 3.5–5.1)
SODIUM: 138 meq/L (ref 135–145)

## 2017-03-24 ENCOUNTER — Ambulatory Visit (INDEPENDENT_AMBULATORY_CARE_PROVIDER_SITE_OTHER): Payer: Medicare HMO | Admitting: Family Medicine

## 2017-03-24 ENCOUNTER — Encounter: Payer: Self-pay | Admitting: Family Medicine

## 2017-03-24 DIAGNOSIS — J0100 Acute maxillary sinusitis, unspecified: Secondary | ICD-10-CM | POA: Diagnosis not present

## 2017-03-24 DIAGNOSIS — J019 Acute sinusitis, unspecified: Secondary | ICD-10-CM | POA: Insufficient documentation

## 2017-03-24 MED ORDER — BENZONATATE 200 MG PO CAPS: 200.0000 mg | ORAL_CAPSULE | Freq: Three times a day (TID) | ORAL | 1 refills | Status: DC | PRN

## 2017-03-24 MED ORDER — AMOXICILLIN-POT CLAVULANATE 875-125 MG PO TABS: 1.0000 | ORAL_TABLET | Freq: Two times a day (BID) | ORAL | 0 refills | Status: DC

## 2017-03-24 NOTE — Patient Instructions (Addendum)
Drink lots of fluids  Breathe steam Do not use afrin many days in a row   Take the augmentin as directed for sinus infection  Change to mucinex DM for cough  Tessalon as needed also   Update if not starting to improve in a week or if worsening    Skip your methotrexate injection tomorrow

## 2017-03-24 NOTE — Assessment & Plan Note (Signed)
S/p uri in pt on biologic medications  Cover with augmentin (skip tomorrow's methotrexate inj)  Disc symptomatic care - see instructions on AVS  mucinex DM Fluids/rest  Tessalon also for cough Update if not starting to improve in a week or if worsening

## 2017-03-24 NOTE — Progress Notes (Signed)
Subjective:    Patient ID: [name], female    DOB: 1965/04/16, 52 y.o.   MRN: [id number]  HPI Here for sinus symptoms with cough and nasal congestion   Temp: 98.6 F (37 C)    This started last Wednesday  Had a cold  Got better and then worse  Last 2 d worse - bad facial pain-cheeks and teeth  Brown nasal d/c  No fever  Very congested-worse in ams  Using afrin - knows not to use too many days in a row   Started with ST-that improved  Ears -ok   Cough- is prod - phlegm  Also taking mucinex (plain) Some left over tessalon   Patient Active Problem List   Diagnosis Date Noted  . Acute sinusitis 03/24/2017  . Elevated serum creatinine 03/03/2017  . Multiple leg contusions 01/05/2017  . Chest wall pain 01/04/2017  . Skin lesion 11/02/2016  . Chronic low back pain 12/09/2015  . Degeneration of lumbar or lumbosacral intervertebral disc 12/09/2015  . Nasal septal deviation 01/03/2013  . Encounter for Medicare annual wellness exam 11/02/2012  . Hydradenitis 11/02/2012  . Hyperglycemia 03/23/2012  . Macular hole 03/23/2012  . Screening-pulmonary TB 03/23/2012  . Routine general medical examination at a health care facility 10/02/2011  . Iritis, recurrent 10/02/2011  . Tachycardia 10/02/2011  . ADD (attention deficit disorder) 08/20/2010  . DYSHIDROTIC ECZEMA, HANDS 11/12/2009  . Obstructive sleep apnea 11/19/2006  . DEPRESSION 09/29/2006  . Essential hypertension 09/29/2006  . Seasonal and perennial allergic rhinitis 09/29/2006  . Asthma, mild intermittent, well-controlled 09/29/2006  . GERD 09/29/2006  . IBS 09/29/2006  . ENDOMETRIOSIS 09/29/2006  . FEMALE INFERTILITY 09/29/2006  . Fibromyalgia 09/29/2006  . INSOMNIA 09/29/2006   Past Medical History:  Diagnosis Date  . Allergy    allergic rhinitis  . Asthma   . Chronic headaches   . Degenerative disc disease   . Depression   . Fibromyalgia   . Fx of fibula 2009  . GERD (gastroesophageal reflux  disease)   . Hemorrhoids   . Hypertension   . IBS (irritable bowel syndrome)   . Iritis, chronic   . Macular hole of right eye   . Sleep apnea    Past Surgical History:  Procedure Laterality Date  . ABDOMINAL HYSTERECTOMY    . ANTERIOR CRUCIATE LIGAMENT REPAIR  1996   small tear of ACL  . BREAST EXCISIONAL BIOPSY Left 1995  . BREAST SURGERY     left nipple removed - benign tumor  . BUNIONECTOMY    . LAPAROSCOPY  08/2004   endometriosis - several   . TONSILLECTOMY     Social History   Tobacco Use  . Smoking status: Never Smoker  . Smokeless tobacco: Never Used  Substance Use Topics  . Alcohol use: No    Alcohol/week: 0.0 oz  . Drug use: No   Family History  Problem Relation Age of Onset  . Hypertension Mother   . Thyroid disease Mother   . Fibromyalgia Mother   . Colon polyps Mother   . Diabetes Father   . Kidney cancer Maternal Grandmother   . Colon cancer Neg Hx    Allergies  Allergen Reactions  . Cetirizine Hcl     REACTION: not effective  . Erythromycin     REACTION: stomach hurts  . Escitalopram Oxalate     REACTION: no improvement  . Lisinopril     REACTION: cough  . Loratadine     REACTION:  not effective  . Nifedipine     REACTION: fatigued and tremor  . Sertraline Hcl     REACTION: no help  . Vyvanse [Lisdexamfetamine Dimesylate]     Headache and excitablility   Current Outpatient Medications on File Prior to Visit  Medication Sig Dispense Refill  . acetaminophen (TYLENOL) 500 MG tablet OTC as directed.     . Adalimumab (HUMIRA) 40 MG/0.8ML PSKT Inject 40 mg into the skin.     Marland Kitchen albuterol (PROVENTIL HFA;VENTOLIN HFA) 108 (90 Base) MCG/ACT inhaler Inhale 2 puffs into the lungs every 6 (six) hours as needed for wheezing or shortness of breath. 1 Inhaler 12  . benzonatate (TESSALON) 100 MG capsule Take 1 capsule (100 mg total) by mouth 3 (three) times daily as needed. 30 capsule 0  . buPROPion (WELLBUTRIN XL) 300 MG 24 hr tablet Take 1 tablet (300  mg total) by mouth daily. 90 tablet 3  . carvedilol (COREG) 6.25 MG tablet Take 1 tablet (6.25 mg total) by mouth 2 (two) times daily with a meal. 180 tablet 3  . Cetirizine HCl (ZYRTEC ALLERGY PO) Take 10 mg by mouth as needed.    . Cholecalciferol (VITAMIN D3 PO) Take 1 capsule by mouth daily.    . [employer] TRIPLE MAGNESIUM COMPLEX PO Take 1 capsule by mouth daily.    . cyclobenzaprine (FLEXERIL) 10 MG tablet Take 1 tablet (10 mg total) by mouth at bedtime as needed for muscle spasms (pain). 30 tablet 0  . diphenhydrAMINE (BENADRYL) 25 mg capsule Take 25 mg by mouth as needed. OTC as directed.     Marland Kitchen esomeprazole (NEXIUM) 20 MG capsule Take 1 capsule (20 mg total) by mouth daily at 12 noon. 90 capsule 3  . folic acid (FOLVITE) 1 MG tablet Take 3 mg by mouth 4 (four) times a week.     Marland Kitchen ibuprofen (ADVIL,MOTRIN) 200 MG tablet Take 200 mg by mouth every 6 (six) hours as needed.    Marland Kitchen KETOROLAC TROMETHAMINE OP Place 4 drops into both eyes daily.    Marland Kitchen leucovorin (WELLCOVORIN) 5 MG tablet Take 5 mg by mouth 2 (two) times a week.    . losartan (COZAAR) 50 MG tablet Take 1 tablet (50 mg total) by mouth daily. 90 tablet 3  . methotrexate 50 MG/2ML injection Inject 25 mg into the vein once a week.    . methotrexate 50 MG/2ML injection Inject into the skin.    . mometasone (ELOCON) 0.1 % cream Apply 1 application topically daily. To affected area /bug bite 15 g 1  . montelukast (SINGULAIR) 10 MG tablet Take 1 tablet (10 mg total) by mouth at bedtime. 90 tablet 3  . prednisoLONE acetate (PRED FORTE) 1 % ophthalmic suspension Place 4 drops into both eyes daily.     . traMADol (ULTRAM) 50 MG tablet TAKE 1 TABLET (50 MG TOTAL) BY MOUTH EVERY 8 (EIGHT) HOURS AS NEEDED FOR PAIN. 30 tablet 3   No current facility-administered medications on file prior to visit.     Review of Systems  Constitutional: Positive for appetite change. Negative for fatigue and fever.  HENT: Positive for congestion, ear pain, postnasal  drip, rhinorrhea, sinus pressure and sore throat. Negative for nosebleeds.   Eyes: Negative for pain, redness and itching.  Respiratory: Positive for cough. Negative for shortness of breath and wheezing.   Cardiovascular: Negative for chest pain.  Gastrointestinal: Negative for abdominal pain, diarrhea, nausea and vomiting.  Endocrine: Negative for polyuria.  Genitourinary: Negative for  dysuria, frequency and urgency.  Musculoskeletal: Negative for arthralgias and myalgias.  Allergic/Immunologic: Negative for immunocompromised state.  Neurological: Positive for headaches. Negative for dizziness, tremors, syncope, weakness and numbness.  Hematological: Negative for adenopathy. Does not bruise/bleed easily.  Psychiatric/Behavioral: Negative for dysphoric mood. The patient is not nervous/anxious.        Objective:   Physical Exam  Constitutional: She appears well-developed and well-nourished. No distress.  Fatigued appearing  Clears throat often  HENT:  Head: Normocephalic and atraumatic.  Right Ear: External ear normal.  Left Ear: External ear normal.  Mouth/Throat: Oropharynx is clear and moist. No oropharyngeal exudate.  Nares are injected and congested  Bilateral maxillary sinus tenderness (mild) Post nasal drip   Eyes: Conjunctivae and EOM are normal. Pupils are equal, round, and reactive to light. Right eye exhibits no discharge. Left eye exhibits no discharge.  Neck: Normal range of motion. Neck supple.  Cardiovascular: Normal rate and regular rhythm.  Pulmonary/Chest: Effort normal and breath sounds normal. No respiratory distress. She has no wheezes. She has no rales.  Good air exch  Lymphadenopathy:    She has no cervical adenopathy.  Neurological: She is alert. No cranial nerve deficit.  Skin: Skin is warm and dry. No rash noted.  Psychiatric: She has a normal mood and affect.          Assessment & Plan:   Problem List Items Addressed This Visit       Respiratory   Acute sinusitis    S/p uri in pt on biologic medications  Cover with augmentin (skip tomorrow's methotrexate inj)  Disc symptomatic care - see instructions on AVS  mucinex DM Fluids/rest  Tessalon also for cough Update if not starting to improve in a week or if worsening        Relevant Medications   amoxicillin-clavulanate (AUGMENTIN) 875-125 MG tablet   benzonatate (TESSALON) 200 MG capsule

## 2017-04-08 ENCOUNTER — Telehealth: Payer: Self-pay | Admitting: Family Medicine

## 2017-04-08 DIAGNOSIS — R739 Hyperglycemia, unspecified: Secondary | ICD-10-CM

## 2017-04-08 DIAGNOSIS — I10 Essential (primary) hypertension: Secondary | ICD-10-CM

## 2017-04-08 DIAGNOSIS — Z0000 Encounter for general adult medical examination without abnormal findings: Secondary | ICD-10-CM

## 2017-04-08 NOTE — Telephone Encounter (Signed)
-----   Message from Ellamae Sia sent at 04/08/2017 10:03 AM EDT ----- Regarding: Lab orders for Thursdday, 4.11.19 Patient is scheduled for CPX labs, please order future labs, Thanks , Karna Christmas

## 2017-04-13 ENCOUNTER — Ambulatory Visit: Payer: Medicare HMO

## 2017-04-15 ENCOUNTER — Other Ambulatory Visit (INDEPENDENT_AMBULATORY_CARE_PROVIDER_SITE_OTHER): Payer: Medicare HMO

## 2017-04-15 DIAGNOSIS — I10 Essential (primary) hypertension: Secondary | ICD-10-CM | POA: Diagnosis not present

## 2017-04-15 DIAGNOSIS — R739 Hyperglycemia, unspecified: Secondary | ICD-10-CM | POA: Diagnosis not present

## 2017-04-15 LAB — COMPREHENSIVE METABOLIC PANEL WITH GFR
ALT: 11 U/L (ref 0–35)
AST: 20 U/L (ref 0–37)
Albumin: 3.8 g/dL (ref 3.5–5.2)
Alkaline Phosphatase: 88 U/L (ref 39–117)
BUN: 13 mg/dL (ref 6–23)
CO2: 30 meq/L (ref 19–32)
Calcium: 8.7 mg/dL (ref 8.4–10.5)
Chloride: 103 meq/L (ref 96–112)
Creatinine, Ser: 0.85 mg/dL (ref 0.40–1.20)
GFR: 75.3 mL/min
Glucose, Bld: 122 mg/dL — ABNORMAL HIGH (ref 70–99)
Potassium: 4.1 meq/L (ref 3.5–5.1)
Sodium: 138 meq/L (ref 135–145)
Total Bilirubin: 0.5 mg/dL (ref 0.2–1.2)
Total Protein: 6.9 g/dL (ref 6.0–8.3)

## 2017-04-15 LAB — CBC WITH DIFFERENTIAL/PLATELET
Basophils Absolute: 0.1 K/uL (ref 0.0–0.1)
Basophils Relative: 1 % (ref 0.0–3.0)
Eosinophils Absolute: 0.3 K/uL (ref 0.0–0.7)
Eosinophils Relative: 5.2 % — ABNORMAL HIGH (ref 0.0–5.0)
HCT: 44.3 % (ref 36.0–46.0)
Hemoglobin: 14.7 g/dL (ref 12.0–15.0)
Lymphocytes Relative: 44.7 % (ref 12.0–46.0)
Lymphs Abs: 2.8 K/uL (ref 0.7–4.0)
MCHC: 33.2 g/dL (ref 30.0–36.0)
MCV: 92 fl (ref 78.0–100.0)
Monocytes Absolute: 0.5 K/uL (ref 0.1–1.0)
Monocytes Relative: 8.1 % (ref 3.0–12.0)
Neutro Abs: 2.6 K/uL (ref 1.4–7.7)
Neutrophils Relative %: 41 % — ABNORMAL LOW (ref 43.0–77.0)
Platelets: 248 K/uL (ref 150.0–400.0)
RBC: 4.82 Mil/uL (ref 3.87–5.11)
RDW: 15.1 % (ref 11.5–15.5)
WBC: 6.2 K/uL (ref 4.0–10.5)

## 2017-04-15 LAB — TSH: TSH: 4.56 u[IU]/mL — ABNORMAL HIGH (ref 0.35–4.50)

## 2017-04-15 LAB — LIPID PANEL
CHOL/HDL RATIO: 3
CHOLESTEROL: 166 mg/dL (ref 0–200)
HDL: 59.1 mg/dL (ref >39.00)
LDL CALC: 92 mg/dL (ref 0–99)
NonHDL: 107.17
Triglycerides: 77 mg/dL (ref 0.0–149.0)
VLDL: 15.4 mg/dL (ref 0.0–40.0)

## 2017-04-15 LAB — HEMOGLOBIN A1C: Hgb A1c MFr Bld: 6.4 % (ref 4.6–6.5)

## 2017-04-19 ENCOUNTER — Ambulatory Visit: Payer: Medicare HMO

## 2017-04-20 ENCOUNTER — Other Ambulatory Visit: Payer: Self-pay | Admitting: Family Medicine

## 2017-04-20 ENCOUNTER — Encounter: Payer: Self-pay | Admitting: Family Medicine

## 2017-04-20 ENCOUNTER — Ambulatory Visit (INDEPENDENT_AMBULATORY_CARE_PROVIDER_SITE_OTHER): Payer: Medicare HMO | Admitting: Family Medicine

## 2017-04-20 VITALS — BP 122/68 | HR 83 | Temp 98.7°F | Ht 66.5 in | Wt 215.2 lb

## 2017-04-20 DIAGNOSIS — Z0000 Encounter for general adult medical examination without abnormal findings: Secondary | ICD-10-CM | POA: Diagnosis not present

## 2017-04-20 DIAGNOSIS — R739 Hyperglycemia, unspecified: Secondary | ICD-10-CM

## 2017-04-20 DIAGNOSIS — M797 Fibromyalgia: Secondary | ICD-10-CM | POA: Diagnosis not present

## 2017-04-20 DIAGNOSIS — R69 Illness, unspecified: Secondary | ICD-10-CM | POA: Diagnosis not present

## 2017-04-20 DIAGNOSIS — R7989 Other specified abnormal findings of blood chemistry: Secondary | ICD-10-CM

## 2017-04-20 DIAGNOSIS — I10 Essential (primary) hypertension: Secondary | ICD-10-CM | POA: Diagnosis not present

## 2017-04-20 DIAGNOSIS — F988 Other specified behavioral and emotional disorders with onset usually occurring in childhood and adolescence: Secondary | ICD-10-CM

## 2017-04-20 DIAGNOSIS — Z1231 Encounter for screening mammogram for malignant neoplasm of breast: Secondary | ICD-10-CM

## 2017-04-20 MED ORDER — AMPHETAMINE-DEXTROAMPHET ER 20 MG PO CP24: 20.0000 mg | ORAL_CAPSULE | ORAL | 0 refills | Status: DC

## 2017-04-20 MED ORDER — BUPROPION HCL ER (XL) 300 MG PO TB24: 300.0000 mg | ORAL_TABLET | Freq: Every day | ORAL | 3 refills | Status: DC

## 2017-04-20 MED ORDER — LOSARTAN POTASSIUM 50 MG PO TABS: 50.0000 mg | ORAL_TABLET | Freq: Every day | ORAL | 3 refills | Status: DC

## 2017-04-20 MED ORDER — CARVEDILOL 6.25 MG PO TABS: 6.2500 mg | ORAL_TABLET | Freq: Two times a day (BID) | ORAL | 3 refills | Status: DC

## 2017-04-20 NOTE — Patient Instructions (Addendum)
Call the pharmacy to see if your losartan is affected by the recall or if they don't have it   Call us when you are ready to schedule a colonoscopy please (after your 52th bday)   Get your mammogram as scheduled   Take a look at the paperwork regarding advance directive   To prevent diabetes Try to get most of your carbohydrates from produce (with the exception of white potatoes)  Eat less bread/pasta/rice/snack foods/cereals/sweets and other items from the middle of the grocery store (processed carbs)  Try the adderall xr 20 mg once daily on work days Let me know how it goes

## 2017-04-20 NOTE — Progress Notes (Signed)
Subjective:    Patient ID: [name], female    DOB: 17-Mar-1965, 52 y.o.   MRN: [id number]  HPI I have personally reviewed the Medicare Annual Wellness questionnaire and have noted 1. The patient's medical and social history 2. Their use of alcohol, tobacco or illicit drugs 3. Their current medications and supplements 4. The patient's functional ability including ADL's, fall risks, home safety risks and hearing or visual             impairment. 5. Diet and physical activities 6. Evidence for depression or mood disorders Pt is on medicare for disability-not age  The patients weight, height, BMI have been recorded in the chart and visual acuity is per eye clinic.  I have made referrals, counseling and provided education to the patient based review of the above and I have provided the pt with a written personalized care plan for preventive services. Reviewed and updated provider list, see scanned forms.  See scanned forms.  Routine anticipatory guidance given to patient.  See health maintenance. Colon cancer screening -colonoscopy nl 2012 - due at 26 -will plan sometime after bday  Breast cancer screening  Mammogram 5/18 neg- has it scheduled already Self breast exam-no lumps  Flu vaccine 10/18 Tetanus vaccine 7/13 Pneumovax-no indication yet (7 y will be in oct)  Zoster vaccine-too young for  Advance directive-has a living will (? Needs to be updated) - unsure if she has a POA (would choose her mom)  Cognitive function addressed- see scanned forms- and if abnormal then additional documentation follows. -no struggles or concerns about memory   Wt Readings from Last 3 Encounters:  04/20/17 215 lb 4 oz (97.6 kg)  03/24/17 210 lb (95.3 kg)  01/04/17 215 lb 8 oz (97.8 kg)   34.22 kg/m   Doing well overall  Having a hard time with pollen   PMH and SH reviewed  Started back to work  Desires ADD medicine    Meds, vitals, and allergies reviewed.   ROS: See HPI.  Otherwise  negative.    bp is stable today  No cp or palpitations or headaches or edema  No side effects to medicines  BP Readings from Last 3 Encounters:  04/20/17 122/68  03/24/17 128/86  01/04/17 126/80        Hearing Screening   125Hz  250Hz  500Hz  1000Hz  2000Hz  3000Hz  4000Hz  6000Hz  8000Hz   Right ear:   40 40 40  40    Left ear:   40 0 40  40    Vision Screening Comments: Pt has eye exam at [facility] every 3 months has not noticed any hearing problems Some allergy ear issues perhaps   Eye problems are ongoing  Had L eye injected in January - still cannot see as well  She tolerates it ok overall  Continues to see opthy   Doing ok with methotrexate  Takes folic acid so it does not cause hair loss   No female/gyn problems  Had hysterectomy -one ovary left    Hyperglycemia  Lab Results  Component Value Date   HGBA1C 6.4 04/15/2017   This is stable from a year ago -no change  Did eat some sweets  Not eating very well -eating on the run  Not a lot of time to exercise - she did join the [employer] and goes when she can (goal is MWF)    Cholesterol Lab Results  Component Value Date   CHOL 166 04/15/2017   CHOL 173  04/10/2016   CHOL 156 03/13/2015   Lab Results  Component Value Date   HDL 59.10 04/15/2017   HDL 58.90 04/10/2016   HDL 43.00 03/13/2015   Lab Results  Component Value Date   LDLCALC 92 04/15/2017   LDLCALC 99 04/10/2016   LDLCALC 98 03/13/2015   Lab Results  Component Value Date   TRIG 77.0 04/15/2017   TRIG 75.0 04/10/2016   TRIG 77.0 03/13/2015   Lab Results  Component Value Date   CHOLHDL 3 04/15/2017   CHOLHDL 3 04/10/2016   CHOLHDL 4 03/13/2015   No results found for: LDLDIRECT Good profile   Thyroid  Lab Results  Component Value Date   TSH 4.56 (H) 04/15/2017   will watch this    Lab Results  Component Value Date   WBC 6.2 04/15/2017   HGB 14.7 04/15/2017   HCT 44.3 04/15/2017   MCV 92.0 04/15/2017   PLT 248.0 04/15/2017   Lab  Results  Component Value Date   CREATININE 0.85 04/15/2017   BUN 13 04/15/2017   NA 138 04/15/2017   K 4.1 04/15/2017   CL 103 04/15/2017   CO2 30 04/15/2017   Lab Results  Component Value Date   ALT 11 04/15/2017   AST 20 04/15/2017   ALKPHOS 88 04/15/2017   BILITOT 0.5 04/15/2017    Patient Active Problem List   Diagnosis Date Noted  . Welcome to Medicare preventive visit 04/20/2017  . Multiple leg contusions 01/05/2017  . Chest wall pain 01/04/2017  . Skin lesion 11/02/2016  . Chronic low back pain 12/09/2015  . Degeneration of lumbar or lumbosacral intervertebral disc 12/09/2015  . Pseudophakia of both eyes 12/15/2013  . Nasal septal deviation 01/03/2013  . Encounter for Medicare annual wellness exam 11/02/2012  . Hydradenitis 11/02/2012  . Cystoid macular degeneration of retina 05/13/2012  . Hyperglycemia 03/23/2012  . Macular hole 03/23/2012  . Screening-pulmonary TB 03/23/2012  . Routine general medical examination at a health care facility 10/02/2011  . Iritis, recurrent 10/02/2011  . Tachycardia 10/02/2011  . ADD (attention deficit disorder) 08/20/2010  . DYSHIDROTIC ECZEMA, HANDS 11/12/2009  . Obstructive sleep apnea 11/19/2006  . DEPRESSION 09/29/2006  . Essential hypertension 09/29/2006  . Seasonal and perennial allergic rhinitis 09/29/2006  . Asthma, mild intermittent, well-controlled 09/29/2006  . GERD 09/29/2006  . IBS 09/29/2006  . ENDOMETRIOSIS 09/29/2006  . FEMALE INFERTILITY 09/29/2006  . Fibromyalgia 09/29/2006  . INSOMNIA 09/29/2006   Past Medical History:  Diagnosis Date  . Allergy    allergic rhinitis  . Asthma   . Chronic headaches   . Degenerative disc disease   . Depression   . Fibromyalgia   . Fx of fibula 2009  . GERD (gastroesophageal reflux disease)   . Hemorrhoids   . Hypertension   . IBS (irritable bowel syndrome)   . Iritis, chronic   . Macular hole of right eye   . Sleep apnea    Past Surgical History:  Procedure  Laterality Date  . ABDOMINAL HYSTERECTOMY    . ANTERIOR CRUCIATE LIGAMENT REPAIR  1996   small tear of ACL  . BREAST EXCISIONAL BIOPSY Left 1995  . BREAST SURGERY     left nipple removed - benign tumor  . BUNIONECTOMY    . LAPAROSCOPY  08/2004   endometriosis - several   . TONSILLECTOMY     Social History   Tobacco Use  . Smoking status: Never Smoker  . Smokeless tobacco: Never Used  Substance Use  Topics  . Alcohol use: No    Alcohol/week: 0.0 oz  . Drug use: No   Family History  Problem Relation Age of Onset  . Hypertension Mother   . Thyroid disease Mother   . Fibromyalgia Mother   . Colon polyps Mother   . Diabetes Father   . Kidney cancer Maternal Grandmother   . Colon cancer Neg Hx    Allergies  Allergen Reactions  . Cetirizine Hcl     REACTION: not effective  . Erythromycin     REACTION: stomach hurts  . Escitalopram Oxalate     REACTION: no improvement  . Lisinopril     REACTION: cough  . Loratadine     REACTION: not effective  . Nifedipine     REACTION: fatigued and tremor  . Sertraline Hcl     REACTION: no help  . Vyvanse [Lisdexamfetamine Dimesylate]     Headache and excitablility   Current Outpatient Medications on File Prior to Visit  Medication Sig Dispense Refill  . acetaminophen (TYLENOL) 500 MG tablet OTC as directed.     . Adalimumab (HUMIRA) 40 MG/0.8ML PSKT Inject 40 mg into the skin.     Marland Kitchen albuterol (PROVENTIL HFA;VENTOLIN HFA) 108 (90 Base) MCG/ACT inhaler Inhale 2 puffs into the lungs every 6 (six) hours as needed for wheezing or shortness of breath. 1 Inhaler 12  . benzonatate (TESSALON) 100 MG capsule Take 1 capsule (100 mg total) by mouth 3 (three) times daily as needed. 30 capsule 0  . benzonatate (TESSALON) 200 MG capsule Take 1 capsule (200 mg total) by mouth 3 (three) times daily as needed. 30 capsule 1  . Cetirizine HCl (ZYRTEC ALLERGY PO) Take 10 mg by mouth as needed.    . Cholecalciferol (VITAMIN D3 PO) Take 1 capsule by  mouth daily.    . [employer] TRIPLE MAGNESIUM COMPLEX PO Take 1 capsule by mouth daily.    . cyclobenzaprine (FLEXERIL) 10 MG tablet Take 1 tablet (10 mg total) by mouth at bedtime as needed for muscle spasms (pain). 30 tablet 0  . diphenhydrAMINE (BENADRYL) 25 mg capsule Take 25 mg by mouth as needed. OTC as directed.     Marland Kitchen esomeprazole (NEXIUM) 20 MG capsule Take 1 capsule (20 mg total) by mouth daily at 12 noon. 90 capsule 3  . folic acid (FOLVITE) 1 MG tablet Take 3 mg by mouth 4 (four) times a week.     Marland Kitchen ibuprofen (ADVIL,MOTRIN) 200 MG tablet Take 200 mg by mouth every 6 (six) hours as needed.    Marland Kitchen KETOROLAC TROMETHAMINE OP Place 4 drops into both eyes daily.    Marland Kitchen leucovorin (WELLCOVORIN) 5 MG tablet Take 5 mg by mouth 2 (two) times a week.    . methotrexate 50 MG/2ML injection Inject 25 mg into the vein once a week.    . methotrexate 50 MG/2ML injection Inject into the skin.    . montelukast (SINGULAIR) 10 MG tablet Take 1 tablet (10 mg total) by mouth at bedtime. 90 tablet 3  . prednisoLONE acetate (PRED FORTE) 1 % ophthalmic suspension Place 4 drops into both eyes daily.     . traMADol (ULTRAM) 50 MG tablet TAKE 1 TABLET (50 MG TOTAL) BY MOUTH EVERY 8 (EIGHT) HOURS AS NEEDED FOR PAIN. 30 tablet 3   No current facility-administered medications on file prior to visit.     Review of Systems  Constitutional: Negative for activity change, appetite change, fatigue, fever and unexpected weight change.  HENT:  Negative for congestion, ear pain, rhinorrhea, sinus pressure and sore throat.   Eyes: Positive for visual disturbance. Negative for pain and redness.  Respiratory: Negative for cough, shortness of breath and wheezing.   Cardiovascular: Negative for chest pain and palpitations.  Gastrointestinal: Negative for abdominal pain, blood in stool, constipation and diarrhea.  Endocrine: Negative for polydipsia and polyuria.  Genitourinary: Negative for dysuria, frequency and urgency.    Musculoskeletal: Negative for arthralgias, back pain and myalgias.  Skin: Negative for pallor and rash.  Allergic/Immunologic: Negative for environmental allergies.  Neurological: Negative for dizziness, syncope and headaches.  Hematological: Negative for adenopathy. Does not bruise/bleed easily.  Psychiatric/Behavioral: Negative for decreased concentration and dysphoric mood. The patient is not nervous/anxious.        Objective:   Physical Exam  Constitutional: She appears well-developed and well-nourished. No distress.  obese and well appearing   HENT:  Head: Normocephalic and atraumatic.  Right Ear: External ear normal.  Left Ear: External ear normal.  Mouth/Throat: Oropharynx is clear and moist.  Eyes: Pupils are equal, round, and reactive to light. Conjunctivae and EOM are normal. No scleral icterus.  Neck: Normal range of motion. Neck supple. No JVD present. Carotid bruit is not present. No thyromegaly present.  Cardiovascular: Normal rate, regular rhythm, normal heart sounds and intact distal pulses. Exam reveals no gallop.  Pulmonary/Chest: Effort normal and breath sounds normal. No respiratory distress. She has no wheezes. She exhibits no tenderness. No breast swelling, tenderness, discharge or bleeding.  Abdominal: Soft. Bowel sounds are normal. She exhibits no distension, no abdominal bruit and no mass. There is no tenderness.  Genitourinary: No breast swelling, tenderness, discharge or bleeding.  Genitourinary Comments: Breast exam: No mass, nodules, thickening, tenderness, bulging, retraction, inflamation, nipple discharge or skin changes noted.  No axillary or clavicular LA.      Musculoskeletal: Normal range of motion. She exhibits no edema or tenderness.  Lymphadenopathy:    She has no cervical adenopathy.  Neurological: She is alert. She has normal reflexes. No cranial nerve deficit. She exhibits normal muscle tone. Coordination normal.  Skin: Skin is warm and dry. No  rash noted. No erythema. No pallor.  Solar lentigines diffusely  Few sks  Psychiatric: She has a normal mood and affect.          Assessment & Plan:   Problem List Items Addressed This Visit      Cardiovascular and Mediastinum   Essential hypertension    bp in fair control at this time  BP Readings from Last 1 Encounters:  04/20/17 122/68   No changes needed Disc lifstyle change with low sodium diet and exercise  Labs reviewed  She will check to see her her lot of losartan was recalled and let us know       Relevant Medications   carvedilol (COREG) 6.25 MG tablet   losartan (COZAAR) 50 MG tablet     Other   ADD (attention deficit disorder)    Is back to work Trial of adderall xr 20 mg and update Disc compliance/ controlled sub policy and method of refill       Fibromyalgia    Stable currently  Also under care of rheumatology  Enc exercise program       Hyperglycemia    Lab Results  Component Value Date   HGBA1C 6.4 04/15/2017  stable disc imp of low glycemic diet and wt loss to prevent DM2        Welcome to Medicare preventive  visit - Primary    Reviewed health habits including diet and exercise and skin cancer prevention Reviewed appropriate screening tests for age  Also reviewed health mt list, fam hx and immunization status , as well as social and family history   See HPI Labs reviewed  Borderline TSH will watch  Adv directive materials given to work on  utd vaccines  Mammogram next mo

## 2017-04-23 NOTE — Assessment & Plan Note (Signed)
Reviewed health habits including diet and exercise and skin cancer prevention Reviewed appropriate screening tests for age  Also reviewed health mt list, fam hx and immunization status , as well as social and family history   See HPI Labs reviewed  Borderline TSH will watch  Adv directive materials given to work on  utd vaccines  Mammogram next mo

## 2017-04-23 NOTE — Assessment & Plan Note (Addendum)
bp in fair control at this time  BP Readings from Last 1 Encounters:  04/20/17 122/68   No changes needed Disc lifstyle change with low sodium diet and exercise  Labs reviewed  She will check to see her her lot of losartan was recalled and let us know

## 2017-04-23 NOTE — Assessment & Plan Note (Signed)
Lab Results  Component Value Date   HGBA1C 6.4 04/15/2017  stable disc imp of low glycemic diet and wt loss to prevent DM2

## 2017-04-23 NOTE — Assessment & Plan Note (Addendum)
Stable currently  Also under care of rheumatology  Enc exercise program

## 2017-04-23 NOTE — Assessment & Plan Note (Signed)
Is back to work Trial of adderall xr 20 mg and update Disc compliance/ controlled sub policy and method of refill

## 2017-04-23 NOTE — Assessment & Plan Note (Signed)
Will watch this  Very mild  No clinical changes

## 2017-05-17 ENCOUNTER — Ambulatory Visit
Admission: RE | Admit: 2017-05-17 | Discharge: 2017-05-17 | Disposition: A | Discharge status: Home / Self Care | Payer: Medicare HMO | Source: Ambulatory Visit | Attending: Family Medicine | Admitting: Family Medicine

## 2017-05-17 DIAGNOSIS — Z1231 Encounter for screening mammogram for malignant neoplasm of breast: Secondary | ICD-10-CM

## 2017-05-18 ENCOUNTER — Encounter: Payer: Self-pay | Admitting: Internal Medicine

## 2017-05-18 ENCOUNTER — Ambulatory Visit (INDEPENDENT_AMBULATORY_CARE_PROVIDER_SITE_OTHER): Payer: Medicare HMO | Admitting: Internal Medicine

## 2017-05-18 VITALS — BP 124/84 | HR 90 | Temp 98.7°F | Wt 216.0 lb

## 2017-05-18 DIAGNOSIS — R05 Cough: Secondary | ICD-10-CM

## 2017-05-18 DIAGNOSIS — R0982 Postnasal drip: Secondary | ICD-10-CM | POA: Diagnosis not present

## 2017-05-18 DIAGNOSIS — R059 Cough, unspecified: Secondary | ICD-10-CM

## 2017-05-18 NOTE — Patient Instructions (Signed)

## 2017-05-18 NOTE — Progress Notes (Signed)
Subjective:    Patient ID: [name], female    DOB: 1965-09-17, 52 y.o.   MRN: [id number]  HPI  Pt presents to the clinic today with c/o runny nose and cough. She reports this started 1 month ago. She is blowing clear mucous out of her nose. The cough is productive of clear mucous. She denies runny nose, nasal congestion, ear pain, or sore throat. She denies fever, chills or body aches. 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 She has a history of allergies and asthma. She has not had sick contacts that she is aware of.  Review of Systems   Past Medical History:  Diagnosis Date  . Allergy    allergic rhinitis  . Asthma   . Chronic headaches   . Degenerative disc disease   . Depression   . Fibromyalgia   . Fx of fibula 2009  . GERD (gastroesophageal reflux disease)   . Hemorrhoids   . Hypertension   . IBS (irritable bowel syndrome)   . Iritis, chronic   . Macular hole of right eye   . Sleep apnea     Current Outpatient Medications  Medication Sig Dispense Refill  . acetaminophen (TYLENOL) 500 MG tablet OTC as directed.     . Adalimumab (HUMIRA) 40 MG/0.8ML PSKT Inject 40 mg into the skin.     Marland Kitchen albuterol (PROVENTIL HFA;VENTOLIN HFA) 108 (90 Base) MCG/ACT inhaler Inhale 2 puffs into the lungs every 6 (six) hours as needed for wheezing or shortness of breath. 1 Inhaler 12  . amphetamine-dextroamphetamine (ADDERALL XR) 20 MG 24 hr capsule Take 1 capsule (20 mg total) by mouth every morning. 30 capsule 0  . benzonatate (TESSALON) 200 MG capsule Take 1 capsule (200 mg total) by mouth 3 (three) times daily as needed. 30 capsule 1  . buPROPion (WELLBUTRIN XL) 300 MG 24 hr tablet Take 1 tablet (300 mg total) by mouth daily. 90 tablet 3  . carvedilol (COREG) 6.25 MG tablet Take 1 tablet (6.25 mg total) by mouth 2 (two) times daily with a meal. 180 tablet 3  . Cholecalciferol (VITAMIN D3 PO) Take 1 capsule by mouth daily.    . [facility] TRIPLE MAGNESIUM  COMPLEX PO Take 1 capsule by mouth daily.    . cyclobenzaprine (FLEXERIL) 10 MG tablet Take 1 tablet (10 mg total) by mouth at bedtime as needed for muscle spasms (pain). 30 tablet 0  . diphenhydrAMINE (BENADRYL) 25 mg capsule Take 25 mg by mouth as needed. OTC as directed.     Marland Kitchen esomeprazole (NEXIUM) 20 MG capsule Take 1 capsule (20 mg total) by mouth daily at 12 noon. 90 capsule 3  . folic acid (FOLVITE) 1 MG tablet Take 3 mg by mouth 4 (four) times a week.     Marland Kitchen ibuprofen (ADVIL,MOTRIN) 200 MG tablet Take 200 mg by mouth every 6 (six) hours as needed.    Marland Kitchen KETOROLAC TROMETHAMINE OP Place 4 drops into both eyes daily.    Marland Kitchen leucovorin (WELLCOVORIN) 5 MG tablet Take 5 mg by mouth 2 (two) times a week.    . losartan (COZAAR) 50 MG tablet Take 1 tablet (50 mg total) by mouth daily. 90 tablet 3  . methotrexate 50 MG/2ML injection Inject 25 mg into the vein once a week.    . montelukast (SINGULAIR) 10 MG tablet Take 1 tablet (10 mg total) by mouth at bedtime. 90 tablet 3  . prednisoLONE acetate (PRED FORTE) 1 % ophthalmic suspension  Place 4 drops into both eyes daily.     . traMADol (ULTRAM) 50 MG tablet TAKE 1 TABLET (50 MG TOTAL) BY MOUTH EVERY 8 (EIGHT) HOURS AS NEEDED FOR PAIN. 30 tablet 3   No current facility-administered medications for this visit.     Allergies  Allergen Reactions  . Cetirizine Hcl     REACTION: not effective  . Erythromycin     REACTION: stomach hurts  . Escitalopram Oxalate     REACTION: no improvement  . Lisinopril     REACTION: cough  . Loratadine     REACTION: not effective  . Nifedipine     REACTION: fatigued and tremor  . Sertraline Hcl     REACTION: no help  . Vyvanse [Lisdexamfetamine Dimesylate]     Headache and excitablility    Family History  Problem Relation Age of Onset  . Hypertension Mother   . Thyroid disease Mother   . Fibromyalgia Mother   . Colon polyps Mother   . Diabetes Father   . Kidney cancer Maternal Grandmother   . Colon  cancer Neg Hx     Social History   Socioeconomic History  . Marital status: Divorced    Spouse name: Not on file  . Number of children: 1  . Years of education: Not on file  . Highest education level: Not on file  Occupational History  . Occupation: Disabled due to fibromyalgia    Employer: UNEMPLOYED  Social Needs  . Financial resource strain: Not on file  . Food insecurity:    Worry: Not on file    Inability: Not on file  . Transportation needs:    Medical: Not on file    Non-medical: Not on file  Tobacco Use  . Smoking status: Never Smoker  . Smokeless tobacco: Never Used  Substance and Sexual Activity  . Alcohol use: No    Alcohol/week: 0.0 oz  . Drug use: No  . Sexual activity: Never  Lifestyle  . Physical activity:    Days per week: Not on file    Minutes per session: Not on file  . Stress: Not on file  Relationships  . Social connections:    Talks on phone: Not on file    Gets together: Not on file    Attends religious service: Not on file    Active member of club or organization: Not on file    Attends meetings of clubs or organizations: Not on file    Relationship status: Not on file  . Intimate partner violence:    Fear of current or ex partner: Not on file    Emotionally abused: Not on file    Physically abused: Not on file    Forced sexual activity: Not on file  Other Topics Concern  . Not on file  Social History Narrative   1 caffeine drink daily      Constitutional: Denies fever, malaise, fatigue, headache or abrupt weight changes.  HEENT: Pt reports runny nose. Denies eye pain, eye redness, ear pain, ringing in the ears, wax buildup, nasal congestion, bloody nose, or sore throat. Respiratory: Pt reports cough. Denies difficulty breathing, shortness of breath, or sputum production.    No other specific complaints in a complete review of systems (except as listed in HPI above).   Objective:   Physical Exam  BP 124/84   Pulse 90   Temp  98.7 F (37.1 C) (Oral)   Wt 216 lb (98 kg)   LMP 08/05/2006  SpO2 97%   BMI 34.34 kg/m  Wt Readings from Last 3 Encounters:  05/18/17 216 lb (98 kg)  04/20/17 215 lb 4 oz (97.6 kg)  03/24/17 210 lb (95.3 kg)    General: Appears her stated age, well developed, well nourished in NAD. HEENT: Head: normal shape and size; Throat/Mouth: Teeth present, mucosa pink and moist, + PND, no exudate, lesions or ulcerations noted.  Neck:  No adenopathy noted. Cardiovascular: Normal rate and rhythm. S1,S2 noted.  No murmur, rubs or gallops noted.  Pulmonary/Chest: Normal effort and positive vesicular breath sounds. No respiratory distress. No wheezes, rales or ronchi noted.   BMET    Component Value Date/Time   NA 138 04/15/2017 0745   K 4.1 04/15/2017 0745   CL 103 04/15/2017 0745   CO2 30 04/15/2017 0745   GLUCOSE 122 (H) 04/15/2017 0745   BUN 13 04/15/2017 0745   CREATININE 0.85 04/15/2017 0745   CREATININE 0.96 10/02/2011 1615   CALCIUM 8.7 04/15/2017 0745   GFRNONAA 74 03/16/2007 0940   GFRAA 90 03/16/2007 0940    Lipid Panel     Component Value Date/Time   CHOL 166 04/15/2017 0745   TRIG 77.0 04/15/2017 0745   HDL 59.10 04/15/2017 0745   CHOLHDL 3 04/15/2017 0745   VLDL 15.4 04/15/2017 0745   LDLCALC 92 04/15/2017 0745    CBC    Component Value Date/Time   WBC 6.2 04/15/2017 0745   RBC 4.82 04/15/2017 0745   HGB 14.7 04/15/2017 0745   HCT 44.3 04/15/2017 0745   PLT 248.0 04/15/2017 0745   MCV 92.0 04/15/2017 0745   MCH 28.4 10/02/2011 1615   MCHC 33.2 04/15/2017 0745   RDW 15.1 04/15/2017 0745   LYMPHSABS 2.8 04/15/2017 0745   MONOABS 0.5 04/15/2017 0745   EOSABS 0.3 04/15/2017 0745   BASOSABS 0.1 04/15/2017 0745    Hgb A1C Lab Results  Component Value Date   HGBA1C 6.4 04/15/2017            Assessment & Plan:   Cough secondary to PND:  No s/s of infection 80 mg Depo IM today Continue Allegra, and Albuterol Start Flonase OTC Continue  Tessalon as needed for cough  Return precautions discussed Webb Silversmith, NP

## 2017-05-21 ENCOUNTER — Telehealth: Payer: Self-pay | Admitting: Family Medicine

## 2017-05-21 MED ORDER — PREDNISONE 10 MG PO TABS: ORAL_TABLET | ORAL | 0 refills | Status: DC

## 2017-05-21 NOTE — Telephone Encounter (Signed)
I rev her note and sent it  F/u if no improvement in symptoms

## 2017-05-21 NOTE — Telephone Encounter (Signed)
Pt notified Rx sent and to f/u if no improvement  °

## 2017-05-21 NOTE — Telephone Encounter (Signed)
Copied from [place] [id number]. Topic: Quick Communication - See Telephone Encounter >> May 21, 2017 12:17 PM Arletha Grippe wrote: CRM for notification. See Telephone encounter for: 05/21/17. Pt is not feeling any better.  She was told to call in and provider would call in prednisone.   [facility] in [place] is [phone]

## 2017-05-29 ENCOUNTER — Encounter (HOSPITAL_COMMUNITY): Payer: Self-pay | Admitting: Emergency Medicine

## 2017-05-29 ENCOUNTER — Emergency Department (HOSPITAL_COMMUNITY)
Admission: EM | Admit: 2017-05-29 | Discharge: 2017-05-29 | Disposition: A | Discharge status: Home / Self Care | Payer: Medicare HMO | Attending: Emergency Medicine | Admitting: Emergency Medicine

## 2017-05-29 ENCOUNTER — Emergency Department (HOSPITAL_COMMUNITY): Payer: Medicare HMO

## 2017-05-29 DIAGNOSIS — I158 Other secondary hypertension: Secondary | ICD-10-CM | POA: Diagnosis not present

## 2017-05-29 DIAGNOSIS — Z79899 Other long term (current) drug therapy: Secondary | ICD-10-CM | POA: Insufficient documentation

## 2017-05-29 DIAGNOSIS — R51 Headache: Secondary | ICD-10-CM | POA: Diagnosis present

## 2017-05-29 DIAGNOSIS — R059 Cough, unspecified: Secondary | ICD-10-CM

## 2017-05-29 DIAGNOSIS — J4531 Mild persistent asthma with (acute) exacerbation: Secondary | ICD-10-CM | POA: Insufficient documentation

## 2017-05-29 DIAGNOSIS — R05 Cough: Secondary | ICD-10-CM | POA: Insufficient documentation

## 2017-05-29 DIAGNOSIS — I10 Essential (primary) hypertension: Secondary | ICD-10-CM | POA: Insufficient documentation

## 2017-05-29 LAB — I-STAT CHEM 8, ED
BUN: 13 mg/dL (ref 6–20)
CALCIUM ION: 1.14 mmol/L — AB (ref 1.15–1.40)
CHLORIDE: 103 mmol/L (ref 101–111)
Creatinine, Ser: 0.8 mg/dL (ref 0.44–1.00)
Glucose, Bld: 95 mg/dL (ref 65–99)
HEMATOCRIT: 44 % (ref 36.0–46.0)
Hemoglobin: 15 g/dL (ref 12.0–15.0)
Potassium: 3.6 mmol/L (ref 3.5–5.1)
SODIUM: 141 mmol/L (ref 135–145)
TCO2: 28 mmol/L (ref 22–32)

## 2017-05-29 MED ORDER — PREDNISONE 20 MG PO TABS
60.0000 mg | ORAL_TABLET | Freq: Once | ORAL | Status: DC
  Filled 2017-05-29: qty 3

## 2017-05-29 MED ORDER — IPRATROPIUM-ALBUTEROL 0.5-2.5 (3) MG/3ML IN SOLN
3.0000 mL | Freq: Once | RESPIRATORY_TRACT | Status: AC
  Administered 2017-05-29: 3 mL via RESPIRATORY_TRACT
  Filled 2017-05-29: qty 3

## 2017-05-29 NOTE — Discharge Instructions (Addendum)
Please follow-up with your primary care provider and discuss possible side effects from losartan which include chronic cough.

## 2017-05-29 NOTE — ED Provider Notes (Signed)
[facility] EMERGENCY DEPARTMENT Provider Note  CSN: [id number] Arrival date & time: 05/29/17 0941  Chief Complaint(s) Hypertension  HPI [name] is a 52 y.o. female   HPI  Patient presents to the emergency department with mild headache that is been ongoing for several days.  Aching in nature.  Bilateral temporal and frontal.  Exacerbated with coughing.  No fevers or chills.  No neck pain.  Patient reports that she has had a persistent cough for approximately 6 to 7 weeks that is currently being treated for an asthma exacerbation with steroids.  She denies any associated chest pain.  She endorses mild shortness of breath consistent with her asthma exacerbation.  No lower extremity swelling.  No nausea or vomiting.  No abdominal pain.  No urinary symptoms.  Patient sent here by her PCPs office for elevated blood pressures with systolics in the 440N noted in the last several days.  Patient reports that she is been compliant with all her medications.  Past Medical History Past Medical History:  Diagnosis Date  . Allergy    allergic rhinitis  . Asthma   . Chronic headaches   . Degenerative disc disease   . Depression   . Fibromyalgia   . Fx of fibula 2009  . GERD (gastroesophageal reflux disease)   . Hemorrhoids   . Hypertension   . IBS (irritable bowel syndrome)   . Iritis, chronic   . Macular hole of right eye   . Sleep apnea    Patient Active Problem List   Diagnosis Date Noted  . Welcome to Medicare preventive visit 04/20/2017  . Multiple leg contusions 01/05/2017  . Skin lesion 11/02/2016  . Chronic low back pain 12/09/2015  . Degeneration of lumbar or lumbosacral intervertebral disc 12/09/2015  . Elevated TSH 01/24/2014  . Pseudophakia of both eyes 12/15/2013  . Nasal septal deviation 01/03/2013  . Encounter for Medicare annual wellness exam 11/02/2012  . Hydradenitis 11/02/2012  . Cystoid macular degeneration of retina 05/13/2012  .  Hyperglycemia 03/23/2012  . Macular hole 03/23/2012  . Screening-pulmonary TB 03/23/2012  . Routine general medical examination at a health care facility 10/02/2011  . Iritis, recurrent 10/02/2011  . Tachycardia 10/02/2011  . ADD (attention deficit disorder) 08/20/2010  . DYSHIDROTIC ECZEMA, HANDS 11/12/2009  . Obstructive sleep apnea 11/19/2006  . DEPRESSION 09/29/2006  . Essential hypertension 09/29/2006  . Seasonal and perennial allergic rhinitis 09/29/2006  . Asthma, mild intermittent, well-controlled 09/29/2006  . GERD 09/29/2006  . IBS 09/29/2006  . ENDOMETRIOSIS 09/29/2006  . FEMALE INFERTILITY 09/29/2006  . Fibromyalgia 09/29/2006  . INSOMNIA 09/29/2006   Home Medication(s) Prior to Admission medications   Medication Sig Start Date End Date Taking? Authorizing Provider  acetaminophen (TYLENOL) 500 MG tablet OTC as directed.     [provider]  Adalimumab (HUMIRA) 40 MG/0.8ML PSKT Inject 40 mg into the skin.     [provider]  albuterol (PROVENTIL HFA;VENTOLIN HFA) 108 (90 Base) MCG/ACT inhaler Inhale 2 puffs into the lungs every 6 (six) hours as needed for wheezing or shortness of breath. 12/07/16   Baird Lyons D, MD  amphetamine-dextroamphetamine (ADDERALL XR) 20 MG 24 hr capsule Take 1 capsule (20 mg total) by mouth every morning. 04/20/17   Tower, Wynelle Fanny, MD  benzonatate (TESSALON) 200 MG capsule Take 1 capsule (200 mg total) by mouth 3 (three) times daily as needed. 03/24/17   Tower, Wynelle Fanny, MD  buPROPion (WELLBUTRIN XL) 300 MG 24 hr tablet  Take 1 tablet (300 mg total) by mouth daily. 04/20/17   Tower, Wynelle Fanny, MD  carvedilol (COREG) 6.25 MG tablet Take 1 tablet (6.25 mg total) by mouth 2 (two) times daily with a meal. 04/20/17   Tower, Wynelle Fanny, MD  Cholecalciferol (VITAMIN D3 PO) Take 1 capsule by mouth daily.    [provider]  [facility] TRIPLE MAGNESIUM COMPLEX PO Take 1 capsule by mouth daily.    [provider]  cyclobenzaprine  (FLEXERIL) 10 MG tablet Take 1 tablet (10 mg total) by mouth at bedtime as needed for muscle spasms (pain). 01/04/17   Tower, Wynelle Fanny, MD  diphenhydrAMINE (BENADRYL) 25 mg capsule Take 25 mg by mouth as needed. OTC as directed.     [provider]  esomeprazole (NEXIUM) 20 MG capsule Take 1 capsule (20 mg total) by mouth daily at 12 noon. 04/22/16   Tower, Wynelle Fanny, MD  folic acid (FOLVITE) 1 MG tablet Take 3 mg by mouth 4 (four) times a week.     [provider]  ibuprofen (ADVIL,MOTRIN) 200 MG tablet Take 200 mg by mouth every 6 (six) hours as needed.    [provider]  KETOROLAC TROMETHAMINE OP Place 4 drops into both eyes daily.    [provider]  leucovorin (WELLCOVORIN) 5 MG tablet Take 5 mg by mouth 2 (two) times a week.    [provider]  losartan (COZAAR) 50 MG tablet Take 1 tablet (50 mg total) by mouth daily. 04/20/17   Tower, Wynelle Fanny, MD  methotrexate 50 MG/2ML injection Inject 25 mg into the vein once a week.    [provider]  montelukast (SINGULAIR) 10 MG tablet Take 1 tablet (10 mg total) by mouth at bedtime. 12/07/16   Baird Lyons D, MD  prednisoLONE acetate (PRED FORTE) 1 % ophthalmic suspension Place 4 drops into both eyes daily.     [provider]  predniSONE (DELTASONE) 10 MG tablet Take 3 pills once daily by mouth for 3 days, then 2 pills once daily for 3 days, then 1 pill once daily for 3 days and then stop 05/21/17   Tower, Wynelle Fanny, MD  traMADol (ULTRAM) 50 MG tablet TAKE 1 TABLET (50 MG TOTAL) BY MOUTH EVERY 8 (EIGHT) HOURS AS NEEDED FOR PAIN. 01/04/17   Tower, Wynelle Fanny, MD                                                                                                                                    Past Surgical History Past Surgical History:  Procedure Laterality Date  . ABDOMINAL HYSTERECTOMY    . ANTERIOR CRUCIATE LIGAMENT REPAIR  1996   small tear of ACL  . BREAST EXCISIONAL BIOPSY Left 1995  .  BREAST SURGERY     left nipple removed - benign tumor  . BUNIONECTOMY    . LAPAROSCOPY  08/2004   endometriosis - several   .  TONSILLECTOMY     Family History Family History  Problem Relation Age of Onset  . Hypertension Mother   . Thyroid disease Mother   . Fibromyalgia Mother   . Colon polyps Mother   . Diabetes Father   . Kidney cancer Maternal Grandmother   . Colon cancer Neg Hx     Social History Social History   Tobacco Use  . Smoking status: Never Smoker  . Smokeless tobacco: Never Used  Substance Use Topics  . Alcohol use: No    Alcohol/week: 0.0 oz  . Drug use: No   Allergies Cetirizine hcl; Erythromycin; Escitalopram oxalate; Lisinopril; Loratadine; Nifedipine; Sertraline hcl; and Vyvanse [lisdexamfetamine dimesylate]  Review of Systems Review of Systems All other systems are reviewed and are negative for acute change except as noted in the HPI  Physical Exam Vital Signs  I have reviewed the triage vital signs BP (!) 155/90 (BP Location: Left Arm)   Pulse 100   Temp 98.9 F (37.2 C) (Oral)   Resp 18   LMP 08/05/2006   SpO2 97%   Physical Exam  Constitutional: She is oriented to person, place, and time. She appears well-developed and well-nourished. No distress.  HENT:  Head: Normocephalic and atraumatic.  Nose: Nose normal.  Eyes: Pupils are equal, round, and reactive to light. Conjunctivae and EOM are normal. Right eye exhibits no discharge. Left eye exhibits no discharge. No scleral icterus.  Neck: Normal range of motion. Neck supple.  Cardiovascular: Normal rate and regular rhythm. Exam reveals no gallop and no friction rub.  No murmur heard. Pulmonary/Chest: Effort normal. No stridor. No respiratory distress. She has wheezes in the right middle field, the right lower field, the left middle field and the left lower field. She has no rales.  Abdominal: Soft. She exhibits no distension. There is no tenderness.  Musculoskeletal: She exhibits no  edema or tenderness.  Neurological: She is alert and oriented to person, place, and time. She has normal strength. No cranial nerve deficit or sensory deficit.  Skin: Skin is warm and dry. No rash noted. She is not diaphoretic. No erythema.  Psychiatric: She has a normal mood and affect.  Vitals reviewed.   ED Results and Treatments Labs (all labs ordered are listed, but only abnormal results are displayed) Labs Reviewed  I-STAT CHEM 8, ED - Abnormal; Notable for the following components:      Result Value   Calcium, Ion 1.14 (*)    All other components within normal limits                                                                                                                         EKG  EKG Interpretation  Date/Time:    Ventricular Rate:    PR Interval:    QRS Duration:   QT Interval:    QTC Calculation:   R Axis:     Text Interpretation:        Radiology Dg Chest 2  View  Result Date: 05/29/2017 CLINICAL DATA:  Pt has had a cough for 6-8 weeks now. Pt treated last week for cough but symptoms have not resolved or lessened. Hx HTN, asthma EXAM: CHEST - 2 VIEW COMPARISON:  01/04/2017 FINDINGS: Lungs are mildly hyperinflated with somewhat coarse bronchovascular markings, no focal infiltrate or overt edema. Heart size and mediastinal contours are within normal limits. No effusion. Vertebral endplate spurring at multiple levels in the mid and lower thoracic spine. IMPRESSION: No acute cardiopulmonary disease. Electronically Signed   By: Lucrezia Europe M.D.   On: 05/29/2017 11:49   Pertinent labs & imaging results that were available during my care of the patient were reviewed by me and considered in my medical decision making (see chart for details).  Medications Ordered in ED Medications  predniSONE (DELTASONE) tablet 60 mg (60 mg Oral Not Given 05/29/17 1134)  ipratropium-albuterol (DUONEB) 0.5-2.5 (3) MG/3ML nebulizer solution 3 mL (3 mLs Nebulization Given 05/29/17 1130)    ipratropium-albuterol (DUONEB) 0.5-2.5 (3) MG/3ML nebulizer solution 3 mL (3 mLs Nebulization Given 05/29/17 1214)                                                                                                                                    Procedures Procedures  (including critical care time)  Medical Decision Making / ED Course I have reviewed the nursing notes for this encounter and the patient's prior records (if available in EHR or on provided paperwork).    Patient with mild hypertension with systolics in the 998P.  No associated chest pain.  No evidence of volume overload concerning for heart failure.  Labs without endorgan damage.  No focal deficits on exam.  Patient does have wheezing with lung exam.  Provided with 2 DuoNeb treatments resulting in significant air movement and resolution of her wheezing.  Chest x-ray obtained given patient's persistent cough while on immunosuppressive medications.  This did not reveal evidence of pneumonia or bronchitis.  During med review I noted that patient is currently on losartan.  She states that she was taken off of lisinopril and placed on losartan for cough related to ACE inhibitor.  Discussed possibility that the losartan may be also contributing to this cough.  Instructed to discuss this medication with her primary care provider.  Final Clinical Impression(s) / ED Diagnoses Final diagnoses:  Cough  Other secondary hypertension  Mild persistent asthma with exacerbation   Disposition: Discharge  Condition: Good  I have discussed the results, Dx and Tx plan with the patient who expressed understanding and agree(s) with the plan. Discharge instructions discussed at great length. The patient was given strict return precautions who verbalized understanding of the instructions. No further questions at time of discharge.    ED Discharge Orders    None       Follow Up: Tower, Wynelle Fanny, MD [address] [place]  [address] [phone]  This chart was dictated using voice recognition software.  Despite best efforts to proofread,  errors can occur which can change the documentation meaning.   Fatima Blank, MD 05/29/17 1312

## 2017-05-29 NOTE — ED Triage Notes (Signed)
Patient referred by PCP for hypertension. BP 152/88 in triage. Patient has not taken antihypertensive medication this morning. Patient reports dull headache. No other complaints.

## 2017-05-29 NOTE — ED Notes (Signed)
Patient able to ambulate independently  

## 2017-06-01 ENCOUNTER — Telehealth: Payer: Self-pay

## 2017-06-01 NOTE — Telephone Encounter (Signed)
PLEASE NOTE: All timestamps contained within this report are represented as [place] Standard Time. CONFIDENTIALTY NOTICE: This fax transmission is intended only for the addressee. It contains information that is legally privileged, confidential or otherwise protected from use or disclosure. If you are not the intended recipient, you are strictly prohibited from reviewing, disclosing, copying using or disseminating any of this information or taking any action in reliance on or regarding this information. If you have received this fax in error, please notify us immediately by telephone so that we can arrange for its return to [place]. Phone: [phone], Toll-Free: [phone], Fax: [phone] Page: 1 of 2 Call Id: [id number] [place] Patient Name: [name] Gender: Female DOB: 31-Dec-1965 Age: 52 Y 11 M 11 D Return Phone Number: [phone] (Primary) Address: City/State/Zip: [place] [address] Client [facility] Night - Client Client Site [place] [facility], Roque Lias - MD Contact Type Call Who Is Calling Patient / Member / Family / Caregiver Call Type Triage / Clinical Relationship To Patient Self Return Phone Number [phone] (Primary) Chief Complaint Blood Pressure High Reason for Call Symptomatic / Request for [place] states she had a cough an was seen, she got cortisone injections, it did not help. She was then put on prednisone. The cough is still there gets worse at night, last night her bp was high. Her bp was 167/111 this morning. Translation No Nurse Assessment Nurse: Mills-Hernandez, RN, Izora Gala Date/Time (Eastern Time): 05/29/2017 8:16:49 AM Confirm and document reason for call. If symptomatic, describe symptoms. ---Caller states she had a cough an was seen, she got  cortisone injections, it did not help. She was then put on prednisone. The cough is still there and gets worse at night, last night her bp was high. Her bp was 167/111 this morning. Has had a cough for about 6-8 weeks. Does the patient have any new or worsening symptoms? ---Yes Will a triage be completed? ---Yes Related visit to physician within the last 2 weeks? ---Yes Does the PT have any chronic conditions? (i.e. diabetes, asthma, etc.) ---Yes List chronic conditions. ---HTN, cough, arthritis, fibromyalgia, asthma Is the patient pregnant or possibly pregnant? (Ask all females between the ages of 29-55) ---No Is this a behavioral health or substance abuse call? ---No Guidelines Guideline Title Affirmed Question Affirmed Notes Nurse Date/Time (Eastern Time) High Blood Pressure [4] Systolic BP >= 742 OR Diastolic >= 595 AND [6] cardiac or neurologic symptoms (e.g., chest pain, difficulty Mills-Hernandez, RN, Izora Gala 05/29/2017 8:19:39 AM PLEASE NOTE: All timestamps contained within this report are represented as [place] Standard Time. CONFIDENTIALTY NOTICE: This fax transmission is intended only for the addressee. It contains information that is legally privileged, confidential or otherwise protected from use or disclosure. If you are not the intended recipient, you are strictly prohibited from reviewing, disclosing, copying using or disseminating any of this information or taking any action in reliance on or regarding this information. If you have received this fax in error, please notify us immediately by telephone so that we can arrange for its return to [place]. Phone: [phone], Toll-Free: [phone], Fax: [phone] Page: 2 of 2 Call Id: [id number] Guidelines Guideline Title Affirmed Question Affirmed Notes Nurse Date/Time Eilene Ghazi Time) breathing, unsteady gait, blurred vision) Disp. Time Eilene Ghazi Time) Disposition Final User 05/29/2017 8:22:38 AM Go to ED Now Yes  Mills-Hernandez, RN, Cindee Salt Disagree/Comply Comply Caller Understands  Yes PreDisposition Home Care Care Advice Given Per Guideline GO TO ED NOW: You need to be seen in the Emergency Department. Go to the ER at ___________ [place] now. Drive carefully. NOTE TO TRIAGER - DRIVING: * Another adult should drive. CALL EMS 911 IF: * Patient passes out, starts acting confused or becomes too weak to stand. * You become worse. CARE ADVICE given per High Blood Pressure (Adult) guideline. Referrals [facility] - ED

## 2017-06-01 NOTE — Telephone Encounter (Signed)
I spoke with pt and she scheduled with Dr Glori Bickers 30' appt on 06/02/17 at 3pm.

## 2017-06-01 NOTE — Telephone Encounter (Signed)
Pt was seen at [facility] ED on 05/29/17.

## 2017-06-02 ENCOUNTER — Ambulatory Visit (INDEPENDENT_AMBULATORY_CARE_PROVIDER_SITE_OTHER): Payer: Medicare HMO | Admitting: Family Medicine

## 2017-06-02 ENCOUNTER — Encounter: Payer: Self-pay | Admitting: Family Medicine

## 2017-06-02 VITALS — BP 132/84 | HR 89 | Temp 98.6°F | Ht 66.5 in | Wt 217.5 lb

## 2017-06-02 DIAGNOSIS — R05 Cough: Secondary | ICD-10-CM

## 2017-06-02 DIAGNOSIS — J302 Other seasonal allergic rhinitis: Secondary | ICD-10-CM | POA: Diagnosis not present

## 2017-06-02 DIAGNOSIS — R059 Cough, unspecified: Secondary | ICD-10-CM

## 2017-06-02 DIAGNOSIS — I10 Essential (primary) hypertension: Secondary | ICD-10-CM

## 2017-06-02 DIAGNOSIS — J3089 Other allergic rhinitis: Secondary | ICD-10-CM

## 2017-06-02 MED ORDER — AMLODIPINE BESYLATE 5 MG PO TABS: 5.0000 mg | ORAL_TABLET | Freq: Every day | ORAL | 11 refills | Status: DC

## 2017-06-02 NOTE — Progress Notes (Signed)
Subjective:    Patient ID: [name], female    DOB: 1965-02-19, 52 y.o.   MRN: [id number]  HPI Here for congestion and cough  Was seen in ED 5/25 for headache and elevated bp and cough  Dg Chest 2 View  Result Date: 05/29/2017 CLINICAL DATA:  Pt has had a cough for 6-8 weeks now. Pt treated last week for cough but symptoms have not resolved or lessened. Hx HTN, asthma EXAM: CHEST - 2 VIEW COMPARISON:  01/04/2017 FINDINGS: Lungs are mildly hyperinflated with somewhat coarse bronchovascular markings, no focal infiltrate or overt edema. Heart size and mediastinal contours are within normal limits. No effusion. Vertebral endplate spurring at multiple levels in the mid and lower thoracic spine. IMPRESSION: No acute cardiopulmonary disease. Electronically Signed   By: Lucrezia Europe M.D.   On: 05/29/2017 11:49   Mm 3d Screen Breast Bilateral  Result Date: 05/17/2017 CLINICAL DATA:  Screening. EXAM: DIGITAL SCREENING BILATERAL MAMMOGRAM WITH TOMO AND CAD COMPARISON:  Previous exam(s). ACR Breast Density Category b: There are scattered areas of fibroglandular density. FINDINGS: There are no findings suspicious for malignancy. Images were processed with CAD. IMPRESSION: No mammographic evidence of malignancy. A result letter of this screening mammogram will be mailed directly to the patient. RECOMMENDATION: Screening mammogram in one year. (Code:SM-B-01Y) BI-RADS CATEGORY  1: Negative. Electronically Signed   By: Fidela Salisbury M.D.   On: 05/17/2017 15:32   cxr was normal  BP Readings from Last 3 Encounters:  06/02/17 132/84  05/29/17 (!) 145/89  05/18/17 124/84   bp is improved today  On coreg 6.25 bid Losartan 50 mg   Was tx with prednisone and neb tx/ duo neb (for wheezing)  Told that losartan may inc chance of cough (she was changed to that from lisinopril in the past)  Already takes singulair   Was seen by NP [facility] 5/14 for cough -daig with cough from pnd Given depo IM 80 mg    flonase added Tessalon  Now cough continues  Not wheezing   Woke up with mild st this am /not bad  Continues allegra and flonase and singulair  Using albuterol mdi fairly frequently  Coughing aggravates asthma -then she wheezes   Still nexium -daily /no gerd symptoms   Used to get allergy shots until Dr Annamaria Boots no longer offered them   Patient Active Problem List   Diagnosis Date Noted  . Cough 06/06/2017  . Welcome to Medicare preventive visit 04/20/2017  . Multiple leg contusions 01/05/2017  . Skin lesion 11/02/2016  . Chronic low back pain 12/09/2015  . Degeneration of lumbar or lumbosacral intervertebral disc 12/09/2015  . Elevated TSH 01/24/2014  . Pseudophakia of both eyes 12/15/2013  . Nasal septal deviation 01/03/2013  . Encounter for Medicare annual wellness exam 11/02/2012  . Hydradenitis 11/02/2012  . Cystoid macular degeneration of retina 05/13/2012  . Hyperglycemia 03/23/2012  . Macular hole 03/23/2012  . Screening-pulmonary TB 03/23/2012  . Routine general medical examination at a health care facility 10/02/2011  . Iritis, recurrent 10/02/2011  . Tachycardia 10/02/2011  . ADD (attention deficit disorder) 08/20/2010  . DYSHIDROTIC ECZEMA, HANDS 11/12/2009  . Obstructive sleep apnea 11/19/2006  . DEPRESSION 09/29/2006  . Essential hypertension 09/29/2006  . Seasonal and perennial allergic rhinitis 09/29/2006  . Asthma, mild intermittent, well-controlled 09/29/2006  . GERD 09/29/2006  . IBS 09/29/2006  . ENDOMETRIOSIS 09/29/2006  . FEMALE INFERTILITY 09/29/2006  . Fibromyalgia 09/29/2006  . INSOMNIA 09/29/2006   Past Medical  History:  Diagnosis Date  . Allergy    allergic rhinitis  . Asthma   . Chronic headaches   . Degenerative disc disease   . Depression   . Fibromyalgia   . Fx of fibula 2009  . GERD (gastroesophageal reflux disease)   . Hemorrhoids   . Hypertension   . IBS (irritable bowel syndrome)   . Iritis, chronic   . Macular hole of  right eye   . Sleep apnea    Past Surgical History:  Procedure Laterality Date  . ABDOMINAL HYSTERECTOMY    . ANTERIOR CRUCIATE LIGAMENT REPAIR  1996   small tear of ACL  . BREAST EXCISIONAL BIOPSY Left 1995  . BREAST SURGERY     left nipple removed - benign tumor  . BUNIONECTOMY    . LAPAROSCOPY  08/2004   endometriosis - several   . TONSILLECTOMY     Social History   Tobacco Use  . Smoking status: Never Smoker  . Smokeless tobacco: Never Used  Substance Use Topics  . Alcohol use: No    Alcohol/week: 0.0 oz  . Drug use: No   Family History  Problem Relation Age of Onset  . Hypertension Mother   . Thyroid disease Mother   . Fibromyalgia Mother   . Colon polyps Mother   . Diabetes Father   . Kidney cancer Maternal Grandmother   . Colon cancer Neg Hx    Allergies  Allergen Reactions  . Cetirizine Hcl     REACTION: not effective  . Erythromycin     REACTION: stomach hurts  . Escitalopram Oxalate     REACTION: no improvement  . Lisinopril     REACTION: cough  . Loratadine     REACTION: not effective  . Nifedipine     REACTION: fatigued and tremor  . Sertraline Hcl     REACTION: no help  . Vyvanse [Lisdexamfetamine Dimesylate]     Headache and excitablility   Current Outpatient Medications on File Prior to Visit  Medication Sig Dispense Refill  . acetaminophen (TYLENOL) 500 MG tablet OTC as directed.     . Adalimumab (HUMIRA) 40 MG/0.8ML PSKT Inject 40 mg into the skin.     Marland Kitchen albuterol (PROVENTIL HFA;VENTOLIN HFA) 108 (90 Base) MCG/ACT inhaler Inhale 2 puffs into the lungs every 6 (six) hours as needed for wheezing or shortness of breath. 1 Inhaler 12  . benzonatate (TESSALON) 200 MG capsule Take 1 capsule (200 mg total) by mouth 3 (three) times daily as needed. 30 capsule 1  . buPROPion (WELLBUTRIN XL) 300 MG 24 hr tablet Take 1 tablet (300 mg total) by mouth daily. 90 tablet 3  . carvedilol (COREG) 6.25 MG tablet Take 1 tablet (6.25 mg total) by mouth 2  (two) times daily with a meal. 180 tablet 3  . Cholecalciferol (VITAMIN D3 PO) Take 1 capsule by mouth daily.    . [employer] TRIPLE MAGNESIUM COMPLEX PO Take 1 capsule by mouth daily.    . cyclobenzaprine (FLEXERIL) 10 MG tablet Take 1 tablet (10 mg total) by mouth at bedtime as needed for muscle spasms (pain). 30 tablet 0  . diphenhydrAMINE (BENADRYL) 25 mg capsule Take 25 mg by mouth as needed. OTC as directed.     Marland Kitchen esomeprazole (NEXIUM) 20 MG capsule Take 1 capsule (20 mg total) by mouth daily at 12 noon. 90 capsule 3  . folic acid (FOLVITE) 1 MG tablet Take 3 mg by mouth 4 (four) times a week.     Marland Kitchen  ibuprofen (ADVIL,MOTRIN) 200 MG tablet Take 200 mg by mouth every 6 (six) hours as needed.    Marland Kitchen KETOROLAC TROMETHAMINE OP Place 4 drops into both eyes daily.    Marland Kitchen leucovorin (WELLCOVORIN) 5 MG tablet Take 5 mg by mouth 2 (two) times a week.    . methotrexate 50 MG/2ML injection Inject 25 mg into the vein once a week.    . montelukast (SINGULAIR) 10 MG tablet Take 1 tablet (10 mg total) by mouth at bedtime. 90 tablet 3  . prednisoLONE acetate (PRED FORTE) 1 % ophthalmic suspension Place 4 drops into both eyes daily.     . traMADol (ULTRAM) 50 MG tablet TAKE 1 TABLET (50 MG TOTAL) BY MOUTH EVERY 8 (EIGHT) HOURS AS NEEDED FOR PAIN. 30 tablet 3   No current facility-administered medications on file prior to visit.     Review of Systems  Constitutional: Negative for activity change, appetite change, fatigue, fever and unexpected weight change.  HENT: Positive for postnasal drip, rhinorrhea, sinus pressure and sneezing. Negative for congestion, ear pain, sinus pain and sore throat.   Eyes: Negative for pain, redness and visual disturbance.  Respiratory: Positive for cough and wheezing. Negative for chest tightness, shortness of breath and stridor.   Cardiovascular: Negative for chest pain and palpitations.  Gastrointestinal: Negative for abdominal pain, blood in stool, constipation and diarrhea.    Endocrine: Negative for polydipsia and polyuria.  Genitourinary: Negative for dysuria, frequency and urgency.  Musculoskeletal: Negative for arthralgias, back pain and myalgias.  Skin: Negative for pallor and rash.  Allergic/Immunologic: Negative for environmental allergies.  Neurological: Negative for dizziness, syncope and headaches.  Hematological: Negative for adenopathy. Does not bruise/bleed easily.  Psychiatric/Behavioral: Negative for decreased concentration and dysphoric mood. The patient is not nervous/anxious.        Objective:   Physical Exam  Constitutional: She appears well-developed and well-nourished. No distress.  obese and well appearing   HENT:  Head: Normocephalic and atraumatic.  Right Ear: External ear normal.  Left Ear: External ear normal.  Mouth/Throat: Oropharynx is clear and moist.  Nares are boggy Clear rhinorrhea and pnd  No sinus tenderness  Eyes: Pupils are equal, round, and reactive to light. Conjunctivae and EOM are normal.  Neck: Normal range of motion. Neck supple. No JVD present. Carotid bruit is not present. No thyromegaly present.  Cardiovascular: Normal rate, regular rhythm, normal heart sounds and intact distal pulses. Exam reveals no gallop.  Pulmonary/Chest: Effort normal and breath sounds normal. No stridor. No respiratory distress. She has no wheezes. She has no rales. She exhibits no tenderness.  No crackles  Scant wheeze only on forced expiration  No rales or rhonchi   Abdominal: Soft. Bowel sounds are normal. She exhibits no distension, no abdominal bruit and no mass. There is no tenderness.  Musculoskeletal: She exhibits no edema.  Lymphadenopathy:    She has no cervical adenopathy.  Neurological: She is alert. She has normal reflexes. No cranial nerve deficit.  Skin: Skin is warm and dry. No rash noted.  Psychiatric: She has a normal mood and affect.          Assessment & Plan:   Problem List Items Addressed This Visit       Cardiovascular and Mediastinum   Essential hypertension    Past hx of cough with lisinopril  That was changed to losartan-not fighting cough again  Will d/c that and change to amlodipine BP: 132/84  F/u planned  She does monitor at home  Relevant Medications   amLODipine (NORVASC) 5 MG tablet     Respiratory   Seasonal and perennial allergic rhinitis    May need new allergist in the future to re start immuno tx if no imp        Other   Cough - Primary    Has had several visits incl ED Reviewed hospital records, lab results and studies in detail  Prednisone has not helped  Taking allegra/flonase and singulair  Worse reactive airways-some imp with NMT and mdi albuterol  Losartan may play role-will change it Continue allergy tx -consider new allergist if no imp (used to get shots)  Continue gerd symptoms

## 2017-06-02 NOTE — Patient Instructions (Addendum)
Hold the losartan just in case you are having a reaction  Will change to amlodipine - alert me if any problems    Continue your allergy and asthma medicines  Continue nexium   Alert me if not improving (cough/wheezing) in the next 3-4 days  We will get you a new allergist if not improving   Take a break from taking blood pressure for 2 weeks   Follow up here in about 6 weeks for a blood pressure visit

## 2017-06-05 DIAGNOSIS — H35341 Macular cyst, hole, or pseudohole, right eye: Secondary | ICD-10-CM

## 2017-06-05 HISTORY — DX: Macular cyst, hole, or pseudohole, right eye: H35.341

## 2017-06-06 DIAGNOSIS — R05 Cough: Secondary | ICD-10-CM | POA: Insufficient documentation

## 2017-06-06 DIAGNOSIS — R059 Cough, unspecified: Secondary | ICD-10-CM | POA: Insufficient documentation

## 2017-06-06 NOTE — Assessment & Plan Note (Signed)
Has had several visits incl ED Reviewed hospital records, lab results and studies in detail  Prednisone has not helped  Taking allegra/flonase and singulair  Worse reactive airways-some imp with NMT and mdi albuterol  Losartan may play role-will change it Continue allergy tx -consider new allergist if no imp (used to get shots)  Continue gerd symptoms

## 2017-06-06 NOTE — Assessment & Plan Note (Signed)
Past hx of cough with lisinopril  That was changed to losartan-not fighting cough again  Will d/c that and change to amlodipine BP: 132/84  F/u planned  She does monitor at home

## 2017-06-06 NOTE — Assessment & Plan Note (Signed)
May need new allergist in the future to re start immuno tx if no imp

## 2017-06-10 ENCOUNTER — Telehealth: Payer: Self-pay | Admitting: Family Medicine

## 2017-06-10 DIAGNOSIS — J4520 Mild intermittent asthma, uncomplicated: Secondary | ICD-10-CM

## 2017-06-10 DIAGNOSIS — J302 Other seasonal allergic rhinitis: Secondary | ICD-10-CM

## 2017-06-10 DIAGNOSIS — R059 Cough, unspecified: Secondary | ICD-10-CM

## 2017-06-10 DIAGNOSIS — J3089 Other allergic rhinitis: Secondary | ICD-10-CM

## 2017-06-10 DIAGNOSIS — R05 Cough: Secondary | ICD-10-CM

## 2017-06-10 NOTE — Telephone Encounter (Signed)
Copied from [place] [id number]. Topic: Quick Communication - See Telephone Encounter >> Jun 10, 2017 10:46 AM Percell Belt A wrote: CRM for notification. See Telephone encounter for: 06/10/17.  Pt state she was just seen and she is not feeling any better, would like a referral  Pt called in and would like a referral to Allergy and Asthma  [place] [phone] But prefer Allergy and Asthma center in [place] [phone]   This is to control her Allergy and Asthma

## 2017-06-10 NOTE — Telephone Encounter (Signed)
Pt last seen 06/02/17

## 2017-06-10 NOTE — Telephone Encounter (Signed)
Called patient back and she wants [place] appt after 1:00pm

## 2017-06-10 NOTE — Telephone Encounter (Signed)
Mother notified referral placed

## 2017-06-10 NOTE — Telephone Encounter (Signed)
Ref done Will send to [facility] Let he know she will get a call

## 2017-06-11 DIAGNOSIS — Z961 Presence of intraocular lens: Secondary | ICD-10-CM | POA: Diagnosis not present

## 2017-06-11 DIAGNOSIS — H2013 Chronic iridocyclitis, bilateral: Secondary | ICD-10-CM | POA: Diagnosis not present

## 2017-06-11 DIAGNOSIS — H35353 Cystoid macular degeneration, bilateral: Secondary | ICD-10-CM | POA: Diagnosis not present

## 2017-06-11 DIAGNOSIS — H20023 Recurrent acute iridocyclitis, bilateral: Secondary | ICD-10-CM | POA: Diagnosis not present

## 2017-06-11 DIAGNOSIS — H35342 Macular cyst, hole, or pseudohole, left eye: Secondary | ICD-10-CM | POA: Diagnosis not present

## 2017-06-11 DIAGNOSIS — Z79899 Other long term (current) drug therapy: Secondary | ICD-10-CM | POA: Diagnosis not present

## 2017-06-11 NOTE — Telephone Encounter (Signed)
Appt made and patient notified 

## 2017-06-24 DIAGNOSIS — Z961 Presence of intraocular lens: Secondary | ICD-10-CM | POA: Diagnosis not present

## 2017-06-24 DIAGNOSIS — I10 Essential (primary) hypertension: Secondary | ICD-10-CM | POA: Diagnosis not present

## 2017-06-24 DIAGNOSIS — G4730 Sleep apnea, unspecified: Secondary | ICD-10-CM | POA: Diagnosis not present

## 2017-06-24 DIAGNOSIS — R69 Illness, unspecified: Secondary | ICD-10-CM | POA: Diagnosis not present

## 2017-06-24 DIAGNOSIS — J45909 Unspecified asthma, uncomplicated: Secondary | ICD-10-CM | POA: Diagnosis not present

## 2017-06-24 DIAGNOSIS — M797 Fibromyalgia: Secondary | ICD-10-CM | POA: Diagnosis not present

## 2017-06-24 DIAGNOSIS — H35342 Macular cyst, hole, or pseudohole, left eye: Secondary | ICD-10-CM | POA: Diagnosis not present

## 2017-06-24 DIAGNOSIS — K219 Gastro-esophageal reflux disease without esophagitis: Secondary | ICD-10-CM | POA: Diagnosis not present

## 2017-06-24 DIAGNOSIS — H209 Unspecified iridocyclitis: Secondary | ICD-10-CM | POA: Diagnosis not present

## 2017-06-24 DIAGNOSIS — Z8669 Personal history of other diseases of the nervous system and sense organs: Secondary | ICD-10-CM | POA: Diagnosis not present

## 2017-06-24 DIAGNOSIS — H35372 Puckering of macula, left eye: Secondary | ICD-10-CM | POA: Diagnosis not present

## 2017-06-24 DIAGNOSIS — H59033 Cystoid macular edema following cataract surgery, bilateral: Secondary | ICD-10-CM | POA: Diagnosis not present

## 2017-06-30 DIAGNOSIS — H20023 Recurrent acute iridocyclitis, bilateral: Secondary | ICD-10-CM | POA: Diagnosis not present

## 2017-06-30 DIAGNOSIS — H35353 Cystoid macular degeneration, bilateral: Secondary | ICD-10-CM | POA: Diagnosis not present

## 2017-07-13 ENCOUNTER — Telehealth: Payer: Self-pay | Admitting: Internal Medicine

## 2017-07-13 NOTE — Telephone Encounter (Signed)
Order was placed at pt's last OV with CY 12/07/16.  Called and spoke with pt stating to her that an order was placed by [place] at last OV.  Pt expressed understanding and stated she went to [facility] after she was eligible for a new one in June and was told by [facility] that they are unable to find the order that was placed.  Called and spoke with Corene Cornea with [facility] stating the above information. Per Corene Cornea, he would pull pt's chart and see if the order that was placed on 12/07/16 is still good or if a new order needs to be placed and would call our office back if one needed to be placed again.  Called and spoke to pt and stated to her the information per [place]. Pt expressed understanding. Nothing further needed.

## 2017-07-13 NOTE — Telephone Encounter (Signed)
Called and spoke with pt letting her know that we needed her to come in for an OV after Corene Cornea with [name] reviewed her last OV.  Stated to pt due to it being past the timeframe is the reason she was needed to come in for an OV and then at that time, we could place another order for her to receive a replacement CPAP.  Pt expressed understanding. OV scheduled for pt with CY 7/11 at [place] with [facility] letting him know about pt's appt that was scheduled.  Corene Cornea expressed understanding. Nothing further needed.

## 2017-07-14 ENCOUNTER — Encounter: Payer: Self-pay | Admitting: Family Medicine

## 2017-07-14 ENCOUNTER — Ambulatory Visit (INDEPENDENT_AMBULATORY_CARE_PROVIDER_SITE_OTHER): Payer: Medicare HMO | Admitting: Family Medicine

## 2017-07-14 VITALS — BP 128/74 | HR 82 | Temp 98.6°F | Ht 66.5 in | Wt 220.0 lb

## 2017-07-14 DIAGNOSIS — I10 Essential (primary) hypertension: Secondary | ICD-10-CM | POA: Diagnosis not present

## 2017-07-14 DIAGNOSIS — R05 Cough: Secondary | ICD-10-CM

## 2017-07-14 DIAGNOSIS — R059 Cough, unspecified: Secondary | ICD-10-CM

## 2017-07-14 MED ORDER — AMLODIPINE BESYLATE 5 MG PO TABS: 5.0000 mg | ORAL_TABLET | Freq: Every day | ORAL | 3 refills | Status: DC

## 2017-07-14 NOTE — Patient Instructions (Signed)
Continue amlodipine as directed  Blood pressure is good   Talk to Dr Annamaria Boots and the allergist about cough  If fever or new symptoms or worse wheezing let us know  Also if you notice acid reflux worsening

## 2017-07-14 NOTE — Progress Notes (Signed)
Subjective:    Patient ID: [name], female    DOB: Dec 27, 1965, 52 y.o.   MRN: [id number]  HPI Here for f/u of HTN and cough  Last visit changed from losartan to amlodipine (? Cough rxn to arb)  She did cough with ace   BP Readings from Last 3 Encounters:  07/14/17 128/74  06/02/17 132/84  05/29/17 (!) 145/89   bp is well controlled Thinks coming off losartan helped a little  No side eff from amlodipine   Wt Readings from Last 3 Encounters:  07/14/17 220 lb (99.8 kg)  06/02/17 217 lb 8 oz (98.7 kg)  05/18/17 216 lb (98 kg)   34.98 kg/m   Still some cough and globus sens in throat  Clear mucous  No nasal symptoms  occ wheeze-uses inhaler  nexium controls gerd-doing well with that   Has appt with allergist aug Sees Dr Annamaria Boots tomorrow   Currently being treated for macular hole (also chronic iritis)   Patient Active Problem List   Diagnosis Date Noted  . Cough 06/06/2017  . Welcome to Medicare preventive visit 04/20/2017  . Multiple leg contusions 01/05/2017  . Skin lesion 11/02/2016  . Chronic low back pain 12/09/2015  . Degeneration of lumbar or lumbosacral intervertebral disc 12/09/2015  . Elevated TSH 01/24/2014  . Pseudophakia of both eyes 12/15/2013  . Nasal septal deviation 01/03/2013  . Encounter for Medicare annual wellness exam 11/02/2012  . Hydradenitis 11/02/2012  . Cystoid macular degeneration of retina 05/13/2012  . Hyperglycemia 03/23/2012  . Macular hole 03/23/2012  . Screening-pulmonary TB 03/23/2012  . Routine general medical examination at a health care facility 10/02/2011  . Iritis, recurrent 10/02/2011  . Tachycardia 10/02/2011  . ADD (attention deficit disorder) 08/20/2010  . DYSHIDROTIC ECZEMA, HANDS 11/12/2009  . Obstructive sleep apnea 11/19/2006  . DEPRESSION 09/29/2006  . Essential hypertension 09/29/2006  . Seasonal and perennial allergic rhinitis 09/29/2006  . Asthma, mild intermittent, well-controlled 09/29/2006  .  GERD 09/29/2006  . IBS 09/29/2006  . ENDOMETRIOSIS 09/29/2006  . FEMALE INFERTILITY 09/29/2006  . Fibromyalgia 09/29/2006  . INSOMNIA 09/29/2006   Past Medical History:  Diagnosis Date  . Allergy    allergic rhinitis  . Asthma   . Chronic headaches   . Degenerative disc disease   . Depression   . Fibromyalgia   . Fx of fibula 2009  . GERD (gastroesophageal reflux disease)   . Hemorrhoids   . Hypertension   . IBS (irritable bowel syndrome)   . Iritis, chronic   . Macular hole of right eye   . Sleep apnea    Past Surgical History:  Procedure Laterality Date  . ABDOMINAL HYSTERECTOMY    . ANTERIOR CRUCIATE LIGAMENT REPAIR  1996   small tear of ACL  . BREAST EXCISIONAL BIOPSY Left 1995  . BREAST SURGERY     left nipple removed - benign tumor  . BUNIONECTOMY    . LAPAROSCOPY  08/2004   endometriosis - several   . TONSILLECTOMY     Social History   Tobacco Use  . Smoking status: Never Smoker  . Smokeless tobacco: Never Used  Substance Use Topics  . Alcohol use: No    Alcohol/week: 0.0 oz  . Drug use: No   Family History  Problem Relation Age of Onset  . Hypertension Mother   . Thyroid disease Mother   . Fibromyalgia Mother   . Colon polyps Mother   . Diabetes Father   . Kidney cancer  Maternal Grandmother   . Colon cancer Neg Hx    Allergies  Allergen Reactions  . Cetirizine Hcl     REACTION: not effective  . Erythromycin     REACTION: stomach hurts  . Escitalopram Oxalate     REACTION: no improvement  . Lisinopril     REACTION: cough  . Loratadine     REACTION: not effective  . Nifedipine     REACTION: fatigued and tremor  . Sertraline Hcl     REACTION: no help  . Vyvanse [Lisdexamfetamine Dimesylate]     Headache and excitablility   Current Outpatient Medications on File Prior to Visit  Medication Sig Dispense Refill  . acetaminophen (TYLENOL) 500 MG tablet OTC as directed.     . Adalimumab (HUMIRA) 40 MG/0.8ML PSKT Inject 40 mg into the  skin.     Marland Kitchen albuterol (PROVENTIL HFA;VENTOLIN HFA) 108 (90 Base) MCG/ACT inhaler Inhale 2 puffs into the lungs every 6 (six) hours as needed for wheezing or shortness of breath. 1 Inhaler 12  . atropine 1 % ophthalmic solution Apply 1 drop to eye daily.    . benzonatate (TESSALON) 200 MG capsule Take 1 capsule (200 mg total) by mouth 3 (three) times daily as needed. 30 capsule 1  . buPROPion (WELLBUTRIN XL) 300 MG 24 hr tablet Take 1 tablet (300 mg total) by mouth daily. 90 tablet 3  . carvedilol (COREG) 6.25 MG tablet Take 1 tablet (6.25 mg total) by mouth 2 (two) times daily with a meal. 180 tablet 3  . Cholecalciferol (VITAMIN D3 PO) Take 1 capsule by mouth daily.    . [employer] TRIPLE MAGNESIUM COMPLEX PO Take 1 capsule by mouth daily.    . cyclobenzaprine (FLEXERIL) 10 MG tablet Take 1 tablet (10 mg total) by mouth at bedtime as needed for muscle spasms (pain). 30 tablet 0  . diphenhydrAMINE (BENADRYL) 25 mg capsule Take 25 mg by mouth as needed. OTC as directed.     Marland Kitchen esomeprazole (NEXIUM) 20 MG capsule Take 1 capsule (20 mg total) by mouth daily at 12 noon. 90 capsule 3  . folic acid (FOLVITE) 1 MG tablet Take 3 mg by mouth 4 (four) times a week.     Marland Kitchen ibuprofen (ADVIL,MOTRIN) 200 MG tablet Take 200 mg by mouth every 6 (six) hours as needed.    Marland Kitchen KETOROLAC TROMETHAMINE OP Place 4 drops into both eyes daily.    Marland Kitchen leucovorin (WELLCOVORIN) 5 MG tablet Take 5 mg by mouth 2 (two) times a week.    . methotrexate 50 MG/2ML injection Inject 25 mg into the vein once a week.    . montelukast (SINGULAIR) 10 MG tablet Take 1 tablet (10 mg total) by mouth at bedtime. 90 tablet 3  . prednisoLONE acetate (PRED FORTE) 1 % ophthalmic suspension Place 4 drops into both eyes daily.     . traMADol (ULTRAM) 50 MG tablet TAKE 1 TABLET (50 MG TOTAL) BY MOUTH EVERY 8 (EIGHT) HOURS AS NEEDED FOR PAIN. 30 tablet 3   No current facility-administered medications on file prior to visit.      Review of Systems    Constitutional: Negative for activity change, appetite change, fatigue, fever and unexpected weight change.  HENT: Negative for congestion, ear pain, rhinorrhea, sinus pressure and sore throat.   Eyes: Negative for pain, redness and visual disturbance.  Respiratory: Positive for cough. Negative for shortness of breath and wheezing.   Cardiovascular: Negative for chest pain and palpitations.  Gastrointestinal: Negative  for abdominal pain, blood in stool, constipation and diarrhea.  Endocrine: Negative for polydipsia and polyuria.  Genitourinary: Negative for dysuria, frequency and urgency.  Musculoskeletal: Positive for arthralgias and myalgias. Negative for back pain.  Skin: Negative for pallor and rash.  Allergic/Immunologic: Negative for environmental allergies.  Neurological: Negative for dizziness, syncope and headaches.  Hematological: Negative for adenopathy. Does not bruise/bleed easily.  Psychiatric/Behavioral: Negative for decreased concentration and dysphoric mood. The patient is not nervous/anxious.        Objective:   Physical Exam  Constitutional: She appears well-developed and well-nourished. No distress.  obese and well appearing   HENT:  Head: Normocephalic and atraumatic.  Mouth/Throat: Oropharynx is clear and moist.  Eyes: Pupils are equal, round, and reactive to light. Conjunctivae and EOM are normal.  Neck: Normal range of motion. Neck supple. No JVD present. Carotid bruit is not present. No thyromegaly present.  Cardiovascular: Normal rate, regular rhythm, normal heart sounds and intact distal pulses. Exam reveals no gallop.  Pulmonary/Chest: Effort normal and breath sounds normal. No stridor. No respiratory distress. She has no wheezes. She has no rales.  No crackles  Good air exch  Abdominal: Soft. Bowel sounds are normal. She exhibits no distension, no abdominal bruit and no mass. There is no tenderness.  Musculoskeletal: She exhibits no edema.   Lymphadenopathy:    She has no cervical adenopathy.  Neurological: She is alert. She has normal reflexes.  Skin: Skin is warm and dry. No rash noted.  Psychiatric: She has a normal mood and affect.          Assessment & Plan:   Problem List Items Addressed This Visit      Cardiovascular and Mediastinum   Essential hypertension - Primary    Improved with change to amlodipine  slt less cough  bp in fair control at this time  BP Readings from Last 1 Encounters:  07/15/17 122/76   No changes needed Most recent labs reviewed  Disc lifstyle change with low sodium diet and exercise        Relevant Medications   amLODipine (NORVASC) 5 MG tablet     Other   Cough    Improved a bit coming off ARB For f/u with pulm and allergy soon

## 2017-07-15 ENCOUNTER — Ambulatory Visit: Payer: Medicare HMO | Admitting: Internal Medicine

## 2017-07-15 ENCOUNTER — Encounter: Payer: Self-pay | Admitting: Internal Medicine

## 2017-07-15 VITALS — BP 122/76 | HR 86 | Ht 68.0 in | Wt 221.0 lb

## 2017-07-15 DIAGNOSIS — R059 Cough, unspecified: Secondary | ICD-10-CM

## 2017-07-15 DIAGNOSIS — R05 Cough: Secondary | ICD-10-CM

## 2017-07-15 DIAGNOSIS — G4733 Obstructive sleep apnea (adult) (pediatric): Secondary | ICD-10-CM | POA: Diagnosis not present

## 2017-07-15 MED ORDER — BUDESONIDE-FORMOTEROL FUMARATE 160-4.5 MCG/ACT IN AERO: 2.0000 | INHALATION_SPRAY | Freq: Two times a day (BID) | RESPIRATORY_TRACT | 0 refills | Status: DC

## 2017-07-15 NOTE — Progress Notes (Signed)
Patient ID: [name], female    DOB: [birth year], 52 y.o.   MRN: [id number]  HPI F followed for obstructive sleep apnea and allergic rhinitis, asthma, complicated by insomnia, GERD, depression. NPSG 10/08/06 AHI 33/hr.  ------------------------------------------  12/07/16 - 52 year old female never smoker followed for OSA, Allergic rhinitis, Asthma, complicated by insomnia, GERD, depression, chronic iritis/[facility]/methotrexate/ Humira CPAP 11/[facility] Download 99% compliance, AHI 1.1/hour ---Asthma; Proventil is no longer covered by Insurance-will need to change. OSA; DME: [facility]. DL attached and needs order for new supplies.  She has been using rescue inhaler about twice a week without need for maintenance controller and with no sleep disturbance. Now on Humira and injected methotrexate for chronic iritis followed at [facility].  07/15/2017- 52 year old female never smoker followed for OSA, Allergic rhinitis, Asthma, complicated by insomnia, GERD, depression, chronic iritis/[facility]/methotrexate/ Humira CPAP auto 5-15/[facility] -----OSA; DME: [facility] Pt wears CPAP nightly-unable to DL card today. Pt here to order new CPAP -needs order sent today-[facility] is aware of ordering being sent.  Download compliance 100% AHI 1.0/hour ER visit in May for HBP and wheezing.  Has appointment with her allergist pending in August.  Chronic cough is better off of lisinopril.  Using rescue inhaler about twice a week with no effect on cough.  Review of Systems- see HPI   + = positive Constitutional:   No-   weight loss, night sweats, fevers, chills, fatigue, lassitude. HEENT:   No-  headaches, difficulty swallowing, tooth/dental problems, sore throat,       No- sneezing, itching, ear ache, +nasal congestion, little post nasal drip,  CV:  No-   chest pain, orthopnea, PND, swelling in lower extremities, anasarca, dizziness, palpitations Resp: No-   shortness of breath with exertion or at rest.              No-   productive  cough, + non-productive cough,  No- coughing up of blood.              No-   change in color of mucus.  No- wheezing.   Skin: No-   rash or lesions. GI:  No-   heartburn, indigestion, abdominal pain, nausea, vomiting,  GU: MS:   Neuro-     nothing unusual Psych:  No- change in mood or affect. No depression or anxiety.  No memory loss.  Objective:   Physical Exam General- Alert, Oriented, Affect-appropriate, Distress- none acute, + Overweight Skin- +keratosis pilaris Lymphadenopathy- none Head- atraumatic            Eyes- Gross vision intact, PERRLA, conjunctivae clear secretions            Ears- Hearing, canals-normal            Nose- mucosa + clear, turbinate edema, +Septal dev,no- polyps, erosion, perforation             Throat- Mallampati II-III , mucosa clear , drainage- none, tonsils- atrophic Neck- flexible , trachea midline, no stridor , thyroid nl, carotid no bruit Chest - symmetrical excursion , unlabored           Heart/CV- RRR , no murmur , no gallop  , no rub, nl s1 s2                           - JVD- none , edema- none, stasis changes- none, varices- none           Lung- clear to P&A, wheeze- none, cough-  none  , dullness-none, rub- none           Chest wall-  Abd-  Br/ Gen/ Rectal- Not done, not indicated Extrem- cyanosis- none, clubbing, none, atrophy- none, strength- nl Neuro- grossly intact to observation

## 2017-07-15 NOTE — Patient Instructions (Addendum)
Order- DME [facility]- please replace old CPAP machine, auto 5-15, mask of choice humidifier, supplies, AirView  Sample Symbicort 160 maintenance inhaler     Inhale 2 puffs, then rise mouth, twice daily  Please call if needed  Ok to keep December appointment

## 2017-07-16 DIAGNOSIS — H35342 Macular cyst, hole, or pseudohole, left eye: Secondary | ICD-10-CM | POA: Diagnosis not present

## 2017-07-16 DIAGNOSIS — H2013 Chronic iridocyclitis, bilateral: Secondary | ICD-10-CM | POA: Diagnosis not present

## 2017-07-16 DIAGNOSIS — H20023 Recurrent acute iridocyclitis, bilateral: Secondary | ICD-10-CM | POA: Diagnosis not present

## 2017-07-16 DIAGNOSIS — H35353 Cystoid macular degeneration, bilateral: Secondary | ICD-10-CM | POA: Diagnosis not present

## 2017-07-16 DIAGNOSIS — Z79899 Other long term (current) drug therapy: Secondary | ICD-10-CM | POA: Diagnosis not present

## 2017-07-17 NOTE — Assessment & Plan Note (Signed)
Improved with change to amlodipine  slt less cough  bp in fair control at this time  BP Readings from Last 1 Encounters:  07/15/17 122/76   No changes needed Most recent labs reviewed  Disc lifstyle change with low sodium diet and exercise

## 2017-07-17 NOTE — Assessment & Plan Note (Signed)
Improved a bit coming off ARB For f/u with pulm and allergy soon

## 2017-07-22 DIAGNOSIS — G4733 Obstructive sleep apnea (adult) (pediatric): Secondary | ICD-10-CM | POA: Diagnosis not present

## 2017-08-10 ENCOUNTER — Ambulatory Visit: Payer: Medicare HMO | Admitting: Allergy & Immunology

## 2017-08-10 ENCOUNTER — Encounter: Payer: Self-pay | Admitting: Allergy & Immunology

## 2017-08-10 VITALS — BP 122/74 | HR 83 | Temp 98.0°F | Resp 17 | Ht 67.32 in | Wt 223.2 lb

## 2017-08-10 DIAGNOSIS — J310 Chronic rhinitis: Secondary | ICD-10-CM

## 2017-08-10 DIAGNOSIS — J4540 Moderate persistent asthma, uncomplicated: Secondary | ICD-10-CM | POA: Diagnosis not present

## 2017-08-10 DIAGNOSIS — R69 Illness, unspecified: Secondary | ICD-10-CM | POA: Diagnosis not present

## 2017-08-10 DIAGNOSIS — J4520 Mild intermittent asthma, uncomplicated: Secondary | ICD-10-CM | POA: Diagnosis not present

## 2017-08-10 MED ORDER — BUDESONIDE-FORMOTEROL FUMARATE 160-4.5 MCG/ACT IN AERO: 2.0000 | INHALATION_SPRAY | Freq: Two times a day (BID) | RESPIRATORY_TRACT | 0 refills | Status: DC

## 2017-08-10 MED ORDER — ALBUTEROL SULFATE (2.5 MG/3ML) 0.083% IN NEBU: 2.5000 mg | INHALATION_SOLUTION | RESPIRATORY_TRACT | 1 refills | Status: DC | PRN

## 2017-08-10 NOTE — Progress Notes (Signed)
NEW PATIENT  Date of Service/Encounter:  08/10/17  Referring provider: Abner Greenspan, MD   Assessment:   Moderate persistent asthma, uncomplicated  Chronic rhinitis - s/p two rounds of allergen immunotherapy  Complicated past medical history, including uveitis and fibromyalgia  Immunosuppressed state - on methotrexate and Humira   Plan/Recommendations:   1. Moderate persistent asthma, uncomplicated - Lung testing looks great today.  - We will not make any changes today. - Spacer sample, demonstration, and nebulizer provided. - Daily controller medication(s): Symbicort 160/4.32mcg two puffs twice daily with spacer - Prior to physical activity: ProAir 2 puffs 10-15 minutes before physical activity. - Rescue medications: ProAir 4 puffs every 4-6 hours as needed or albuterol nebulizer one vial every 4-6 hours as needed - Asthma control goals:  * Full participation in all desired activities (may need albuterol before activity) * Albuterol use two time or less a week on average (not counting use with activity) * Cough interfering with sleep two time or less a month * Oral steroids no more than once a year * No hospitalizations  2. Chronic rhinitis - We will hold off on allergy testing at this time. - Continue with an antihistamine daily. - Continue with fluticasone 1-2 sprays per nostril daily.  3. Return in about 3 months (around 11/10/2017).  Subjective:   [name] is a 52 y.o. female presenting today for evaluation of  Chief Complaint  Patient presents with  . Asthma    [name] has a history of the following: Patient Active Problem List   Diagnosis Date Noted  . Cough 06/06/2017  . Welcome to Medicare preventive visit 04/20/2017  . Multiple leg contusions 01/05/2017  . Skin lesion 11/02/2016  . Chronic low back pain 12/09/2015  . Degeneration of lumbar or lumbosacral intervertebral disc 12/09/2015  . Elevated TSH 01/24/2014  . Pseudophakia of  both eyes 12/15/2013  . Nasal septal deviation 01/03/2013  . Encounter for Medicare annual wellness exam 11/02/2012  . Hydradenitis 11/02/2012  . Cystoid macular degeneration of retina 05/13/2012  . Hyperglycemia 03/23/2012  . Macular hole 03/23/2012  . Screening-pulmonary TB 03/23/2012  . Routine general medical examination at a health care facility 10/02/2011  . Iritis, recurrent 10/02/2011  . Tachycardia 10/02/2011  . ADD (attention deficit disorder) 08/20/2010  . DYSHIDROTIC ECZEMA, HANDS 11/12/2009  . Obstructive sleep apnea 11/19/2006  . DEPRESSION 09/29/2006  . Essential hypertension 09/29/2006  . Seasonal and perennial allergic rhinitis 09/29/2006  . Asthma, mild intermittent, well-controlled 09/29/2006  . GERD 09/29/2006  . IBS 09/29/2006  . ENDOMETRIOSIS 09/29/2006  . FEMALE INFERTILITY 09/29/2006  . Fibromyalgia 09/29/2006  . INSOMNIA 09/29/2006    History obtained from: chart review and patient.  [name] was referred by Abner Greenspan, MD.     [name] is a 52 y.o. female presenting to re-establish care. She was previously seen by Dr. Shaune Leeks 10+ years ago. She then went to see Dr. Annamaria Boots for the second time (prior to this, she was see by him in her teens).   Asthma/Respiratory Symptom History: Currently she is on Symbicort 160/4.5 two puffs BID as well as Ventolin as needed. She was placed on the Symbicort after being seen in the ED for breathing problems. It was not clear whether this was asthma related at the time versus medication related. In any case, she was started on Symbicort on July 11th. She was on Advair in the distant past. She does feel better on the Symbicort and  has not had the cough or postnasal drip with it. She endorses good night time sleep.   Allergic Rhinitis Symptom History: She does have a history of allergic rhinitis. She is currently on an antihistamine rotating every day. She is also on fluticasone which she takes more days than note. She  was last allergy tested years ago by Dr. Annamaria Boots. She was on allergy injections through Dr. Annamaria Boots for almost five years. Then she had been on allergy shots prior to that with Dr. Shaune Leeks for an unknown period of time.   She has a history of uveitis and in June she had a macular eye repair. She has been worked up for autoimmune etiologies, and the workup was negative. She does have a known history of fibromyalgia. She is on MTX and Humira injections for inflammation of her eyes. She is followed by Dr. Tomi Likens (ophthamologist) and Dr. Candee Furbish (Rheumatologist)  at [facility].   Otherwise, there is no history of other atopic diseases, including drug allergies, food allergies, stinging insect allergies, or urticaria. There is no significant infectious history. Vaccinations are up to date.    Past Medical History: Patient Active Problem List   Diagnosis Date Noted  . Cough 06/06/2017  . Welcome to Medicare preventive visit 04/20/2017  . Multiple leg contusions 01/05/2017  . Skin lesion 11/02/2016  . Chronic low back pain 12/09/2015  . Degeneration of lumbar or lumbosacral intervertebral disc 12/09/2015  . Elevated TSH 01/24/2014  . Pseudophakia of both eyes 12/15/2013  . Nasal septal deviation 01/03/2013  . Encounter for Medicare annual wellness exam 11/02/2012  . Hydradenitis 11/02/2012  . Cystoid macular degeneration of retina 05/13/2012  . Hyperglycemia 03/23/2012  . Macular hole 03/23/2012  . Screening-pulmonary TB 03/23/2012  . Routine general medical examination at a health care facility 10/02/2011  . Iritis, recurrent 10/02/2011  . Tachycardia 10/02/2011  . ADD (attention deficit disorder) 08/20/2010  . DYSHIDROTIC ECZEMA, HANDS 11/12/2009  . Obstructive sleep apnea 11/19/2006  . DEPRESSION 09/29/2006  . Essential hypertension 09/29/2006  . Seasonal and perennial allergic rhinitis 09/29/2006  . Asthma, mild intermittent, well-controlled 09/29/2006  . GERD 09/29/2006  .  IBS 09/29/2006  . ENDOMETRIOSIS 09/29/2006  . FEMALE INFERTILITY 09/29/2006  . Fibromyalgia 09/29/2006  . INSOMNIA 09/29/2006    Medication List:  Allergies as of 08/10/2017      Reactions   Cetirizine Hcl    REACTION: not effective   Erythromycin    REACTION: stomach hurts   Escitalopram Oxalate    REACTION: no improvement   Lisinopril    REACTION: cough   Loratadine    REACTION: not effective   Nifedipine    REACTION: fatigued and tremor   Sertraline Hcl    REACTION: no help   Vyvanse [lisdexamfetamine Dimesylate]    Headache and excitablility      Medication List        Accurate as of 08/10/17  3:20 PM. Always use your most recent med list.          acetaminophen 500 MG tablet Commonly known as:  TYLENOL OTC as directed.   albuterol 108 (90 Base) MCG/ACT inhaler Commonly known as:  PROVENTIL HFA;VENTOLIN HFA Inhale 2 puffs into the lungs every 6 (six) hours as needed for wheezing or shortness of breath.   amLODipine 5 MG tablet Commonly known as:  NORVASC Take 1 tablet (5 mg total) by mouth daily.   atropine 1 % ophthalmic solution Apply 1 drop to eye daily.  benzonatate 200 MG capsule Commonly known as:  TESSALON Take 1 capsule (200 mg total) by mouth 3 (three) times daily as needed.   budesonide-formoterol 160-4.5 MCG/ACT inhaler Commonly known as:  SYMBICORT Inhale 2 puffs into the lungs 2 (two) times daily.   buPROPion 300 MG 24 hr tablet Commonly known as:  WELLBUTRIN XL Take 1 tablet (300 mg total) by mouth daily.   carvedilol 6.25 MG tablet Commonly known as:  COREG Take 1 tablet (6.25 mg total) by mouth 2 (two) times daily with a meal.   [employer] TRIPLE MAGNESIUM COMPLEX PO Take 1 capsule by mouth daily.   cyclobenzaprine 10 MG tablet Commonly known as:  FLEXERIL Take 1 tablet (10 mg total) by mouth at bedtime as needed for muscle spasms (pain).   diphenhydrAMINE 25 mg capsule Commonly known as:  BENADRYL Take 25 mg by mouth as needed.  OTC as directed.   esomeprazole 20 MG capsule Commonly known as:  NEXIUM Take 1 capsule (20 mg total) by mouth daily at 12 noon.   fluticasone 50 MCG/ACT nasal spray Commonly known as:  FLONASE Place 2 sprays into both nostrils daily.   folic acid 1 MG tablet Commonly known as:  FOLVITE Take 3 mg by mouth 4 (four) times a week.   HUMIRA 40 MG/0.8ML Pskt Generic drug:  Adalimumab Inject 40 mg into the skin.   ibuprofen 200 MG tablet Commonly known as:  ADVIL,MOTRIN Take 200 mg by mouth every 6 (six) hours as needed.   KETOROLAC TROMETHAMINE OP Place 4 drops into both eyes daily.   leucovorin 5 MG tablet Commonly known as:  WELLCOVORIN Take 5 mg by mouth 2 (two) times a week.   methotrexate 50 MG/2ML injection Inject 25 mg into the vein once a week.   montelukast 10 MG tablet Commonly known as:  SINGULAIR Take 1 tablet (10 mg total) by mouth at bedtime.   prednisoLONE acetate 1 % ophthalmic suspension Commonly known as:  PRED FORTE Place 4 drops into both eyes daily.   traMADol 50 MG tablet Commonly known as:  ULTRAM TAKE 1 TABLET (50 MG TOTAL) BY MOUTH EVERY 8 (EIGHT) HOURS AS NEEDED FOR PAIN.   VITAMIN D3 PO Take 1 capsule by mouth daily.       Birth History: non-contributory.   Developmental History: non-contributory.   Past Surgical History: Past Surgical History:  Procedure Laterality Date  . ABDOMINAL HYSTERECTOMY    . ANTERIOR CRUCIATE LIGAMENT REPAIR  1996   small tear of ACL  . BREAST EXCISIONAL BIOPSY Left 1995  . BREAST SURGERY     left nipple removed - benign tumor  . BUNIONECTOMY    . LAPAROSCOPY  08/2004   endometriosis - several   . TONSILLECTOMY       Family History: Family History  Problem Relation Age of Onset  . Hypertension Mother   . Thyroid disease Mother   . Fibromyalgia Mother   . Colon polyps Mother   . Diabetes Father   . Kidney cancer Maternal Grandmother   . Colon cancer Neg Hx      Social History: Devani  lives at home with her daughter.  She lives in a house that is 35 years old.  She has vinyl throughout the house.  They have gas heating and central cooling.  There 3 cats inside of the home.  There are no dust mite covers.  There are no roaches or mildew. 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     Review of Systems: a 14-point review of systems is pertinent for what is mentioned in HPI.  Otherwise, all other systems were negative. Constitutional: negative other than that listed in the HPI Eyes: negative other than that listed in the HPI Ears, nose, mouth, throat, and face: negative other than that listed in the HPI Respiratory: negative other than that listed in the HPI Cardiovascular: negative other than that listed in the HPI Gastrointestinal: negative other than that listed in the HPI Genitourinary: negative other than that listed in the HPI Integument: negative other than that listed in the HPI Hematologic: negative other than that listed in the HPI Musculoskeletal: negative other than that listed in the HPI Neurological: negative other than that listed in the HPI Allergy/Immunologic: negative other than that listed in the HPI    Objective:   Blood pressure 122/74, pulse 83, temperature 98 F (36.7 C), temperature source Oral, resp. rate 17, height 5' 7.32" (1.71 m), weight 223 lb 3.2 oz (101.2 kg), last menstrual period 08/05/2006, SpO2 95 %. Body mass index is 34.62 kg/m.   Physical Exam:  General: Alert, interactive, in no acute distress. Pleasant female.  Eyes: No conjunctival injection bilaterally, no discharge on the right, no discharge on the left and no Horner-Trantas dots present. PERRL bilaterally. EOMI without pain. No photophobia.  Ears: Right TM pearly gray with normal light reflex, Left TM pearly gray with normal light reflex, Right TM intact without perforation and Left TM intact without perforation.  Nose/Throat: External nose within normal limits  and septum midline. Turbinates edematous without discharge. Posterior oropharynx mildly erythematous without cobblestoning in the posterior oropharynx. Tonsils 2+ without exudates.  Tongue without thrush. Neck: Supple without thyromegaly. Trachea midline. Adenopathy: no enlarged lymph nodes appreciated in the anterior cervical, occipital, axillary, epitrochlear, inguinal, or popliteal regions. Lungs: Clear to auscultation without wheezing, rhonchi or rales. No increased work of breathing. CV: Normal S1/S2. No murmurs. Capillary refill <2 seconds.  Abdomen: Nondistended, nontender. No guarding or rebound tenderness. Bowel sounds present in all fields and hypoactive  Skin: Warm and dry, without lesions or rashes. Extremities:  No clubbing, cyanosis or edema. Neuro:   Grossly intact. No focal deficits appreciated. Responsive to questions.  Diagnostic studies:   Spirometry: results normal (FEV1: 2.39/86%, FVC: 3.43/94%, FEV1/FVC: 69%).    Spirometry consistent with normal pattern.   Allergy Studies: none     Salvatore Marvel, MD [facility] [place]

## 2017-08-10 NOTE — Patient Instructions (Addendum)
1. Moderate persistent asthma, uncomplicated - Lung testing looks great today.  - We will not make any changes today. - Spacer sample, demonstration, and nebulizer provided. - Daily controller medication(s): Symbicort 160/4.55mcg two puffs twice daily with spacer - Prior to physical activity: ProAir 2 puffs 10-15 minutes before physical activity. - Rescue medications: ProAir 4 puffs every 4-6 hours as needed or albuterol nebulizer one vial every 4-6 hours as needed - Asthma control goals:  * Full participation in all desired activities (may need albuterol before activity) * Albuterol use two time or less a week on average (not counting use with activity) * Cough interfering with sleep two time or less a month * Oral steroids no more than once a year * No hospitalizations  2. Chronic rhinitis - We will hold off on allergy testing at this time. - Continue with an antihistamine daily. - Continue with fluticasone 1-2 sprays per nostril daily.  3. Return in about 3 months (around 11/10/2017).  Please inform us of any Emergency Department visits, hospitalizations, or changes in symptoms. Call us before going to the ED for breathing or allergy symptoms since we might be able to fit you in for a sick visit. Feel free to contact us anytime with any questions, problems, or concerns.   It was a pleasure to meet you and your family today!  Websites that have reliable patient information: 1. American Academy of Asthma, Allergy, and Immunology: www.aaaai.org 2. Food Allergy Research and Education (FARE): foodallergy.org 3. Mothers of Asthmatics: http://www.asthmacommunitynetwork.org 4. American College of Allergy, Asthma, and Immunology: MonthlyElectricBill.co.uk   Make sure you are registered to vote! If you have moved or changed any of your contact information, you will need to get this updated before voting!

## 2017-08-22 DIAGNOSIS — G4733 Obstructive sleep apnea (adult) (pediatric): Secondary | ICD-10-CM | POA: Diagnosis not present

## 2017-08-23 NOTE — Assessment & Plan Note (Signed)
Approved of lisinopril but has not resolved completely.  We discussed common related factors including postnasal drip, asthma, reflux. Plan-sample Symbicort 160 for trial.  Discuss with allergist at pending appointment.

## 2017-08-23 NOTE — Assessment & Plan Note (Signed)
She continues to benefit from CPAP and download confirms excellent compliance and control. Plan-order replacement for old CPAP machine auto 5-15

## 2017-08-25 DIAGNOSIS — H35353 Cystoid macular degeneration, bilateral: Secondary | ICD-10-CM | POA: Diagnosis not present

## 2017-08-25 DIAGNOSIS — H35342 Macular cyst, hole, or pseudohole, left eye: Secondary | ICD-10-CM | POA: Diagnosis not present

## 2017-08-25 DIAGNOSIS — Z79899 Other long term (current) drug therapy: Secondary | ICD-10-CM | POA: Diagnosis not present

## 2017-08-25 DIAGNOSIS — H20023 Recurrent acute iridocyclitis, bilateral: Secondary | ICD-10-CM | POA: Diagnosis not present

## 2017-09-01 ENCOUNTER — Encounter: Payer: Self-pay | Admitting: Acute Care

## 2017-09-01 ENCOUNTER — Ambulatory Visit: Payer: Medicare HMO | Admitting: Acute Care

## 2017-09-01 DIAGNOSIS — G4733 Obstructive sleep apnea (adult) (pediatric): Secondary | ICD-10-CM | POA: Diagnosis not present

## 2017-09-01 NOTE — Progress Notes (Signed)
History of Present Illness [name] is a 52 y.o. female never smoker  with OSA on CPAP, allergic rhinitis, asthma GERD , depression and insomnia. She is followed by Dr. Annamaria Boots   09/01/2017 Compliance follow up: Pt. Presents for follow up. She has a new machine which she states is much louder than her last machine.She denies any early morning headaches. She is having some sinus issues at present. She is compliant with her Allegra and Flonase.She is on Humira for her chronic iritis. Secretions are clear . She denies any fever, chest pain, orthopnea or hemoptysis. She is compliant with her therapy. Test Results: Down Load 09/01/2017 Auto Set 5-15 cm H2O 100% compliance Average usage 7 hours 50 minutes AHI 1.0  No issues with mask or leaking  NPSG 10/08/06 AHI 33/hr. Spirometry: 08/2107>> Normal Ventilatory Function  CBC Latest Ref Rng & Units 05/29/2017 04/15/2017 04/10/2016  WBC 4.0 - 10.5 K/uL - 6.2 6.0  Hemoglobin 12.0 - 15.0 g/dL 15.0 14.7 14.2  Hematocrit 36.0 - 46.0 % 44.0 44.3 43.0  Platelets 150.0 - 400.0 K/uL - 248.0 232.0    BMP Latest Ref Rng & Units 05/29/2017 04/15/2017 03/23/2017  Glucose 65 - 99 mg/dL 95 122(H) 124(H)  BUN 6 - 20 mg/dL 13 13 16   Creatinine 0.44 - 1.00 mg/dL 0.80 0.85 0.88  Sodium 135 - 145 mmol/L 141 138 138  Potassium 3.5 - 5.1 mmol/L 3.6 4.1 3.8  Chloride 101 - 111 mmol/L 103 103 103  CO2 19 - 32 mEq/L - 30 28  Calcium 8.4 - 10.5 mg/dL - 8.7 9.6    BNP No results found for: BNP  ProBNP No results found for: PROBNP  PFT No results found for: FEV1PRE, FEV1POST, FVCPRE, FVCPOST, TLC, DLCOUNC, PREFEV1FVCRT, PSTFEV1FVCRT  No results found.   Past medical hx Past Medical History:  Diagnosis Date  . Allergy    allergic rhinitis  . Asthma   . Chronic headaches   . Degenerative disc disease   . Depression   . Fibromyalgia   . Fx of fibula 2009  . GERD (gastroesophageal reflux disease)   . Hemorrhoids   . Hypertension   . IBS  (irritable bowel syndrome)   . Iritis, chronic   . Macular hole of right eye   . Sleep apnea      Social History   Tobacco Use  . Smoking status: Never Smoker  . Smokeless tobacco: Never Used  Substance Use Topics  . Alcohol use: No    Alcohol/week: 0.0 standard drinks  . Drug use: No    Ms.Macaluso reports that she has never smoked. She has never used smokeless tobacco. She reports that she does not drink alcohol or use drugs.  Tobacco Cessation: Never smoker  Past surgical hx, Family hx, Social hx all reviewed.  Current Outpatient Medications on File Prior to Visit  Medication Sig  . acetaminophen (TYLENOL) 500 MG tablet OTC as directed.   . Adalimumab (HUMIRA) 40 MG/0.8ML PSKT Inject 40 mg into the skin.   Marland Kitchen albuterol (PROVENTIL HFA;VENTOLIN HFA) 108 (90 Base) MCG/ACT inhaler Inhale 2 puffs into the lungs every 6 (six) hours as needed for wheezing or shortness of breath. (Patient taking differently: Inhale 2 puffs into the lungs every 6 (six) hours as needed for wheezing or shortness of breath.)  . albuterol (PROVENTIL) (2.5 MG/3ML) 0.083% nebulizer solution Take 3 mLs (2.5 mg total) by nebulization every 4 (four) hours as needed for wheezing or shortness of breath.  Marland Kitchen  amLODipine (NORVASC) 5 MG tablet Take 1 tablet (5 mg total) by mouth daily.  Marland Kitchen atropine 1 % ophthalmic solution Apply 1 drop to eye daily.  . budesonide-formoterol (SYMBICORT) 160-4.5 MCG/ACT inhaler Inhale 2 puffs into the lungs 2 (two) times daily.  Marland Kitchen buPROPion (WELLBUTRIN XL) 300 MG 24 hr tablet Take 1 tablet (300 mg total) by mouth daily.  . carvedilol (COREG) 6.25 MG tablet Take 1 tablet (6.25 mg total) by mouth 2 (two) times daily with a meal.  . Cholecalciferol (VITAMIN D3 PO) Take 1 capsule by mouth daily.  . [employer]S TRIPLE MAGNESIUM COMPLEX PO Take 1 capsule by mouth daily.  . cyclobenzaprine (FLEXERIL) 10 MG tablet Take 1 tablet (10 mg total) by mouth at bedtime as needed for muscle spasms (pain). (Patient  taking differently: Take 1 tablet (10 mg total) by mouth at bedtime as needed for muscle spasms (pain).)  . diphenhydrAMINE (BENADRYL) 25 mg capsule Take 25 mg by mouth as needed. OTC as directed.   Marland Kitchen esomeprazole (NEXIUM) 20 MG capsule Take 1 capsule (20 mg total) by mouth daily at 12 noon.  . fluticasone (FLONASE) 50 MCG/ACT nasal spray Place 2 sprays into both nostrils daily.  . folic acid (FOLVITE) 1 MG tablet Take 3 mg by mouth 4 (four) times a week.   Marland Kitchen ibuprofen (ADVIL,MOTRIN) 200 MG tablet Take 200 mg by mouth every 6 (six) hours as needed.  Marland Kitchen KETOROLAC TROMETHAMINE OP Place 4 drops into both eyes daily.  Marland Kitchen leucovorin (WELLCOVORIN) 5 MG tablet Take 5 mg by mouth 2 (two) times a week.  . methocarbamol (ROBAXIN) 500 MG tablet Take 500 mg by mouth 4 (four) times daily as needed for muscle spasms.  . methotrexate 50 MG/2ML injection Inject 25 mg into the vein once a week.  . montelukast (SINGULAIR) 10 MG tablet Take 1 tablet (10 mg total) by mouth at bedtime.  Ernestine Conrad 3-6-9 Fatty Acids (OMEGA-3-6-9 PO) Take 1,000 mg by mouth 2 (two) times daily.  . prednisoLONE acetate (PRED FORTE) 1 % ophthalmic suspension Place 4 drops into both eyes daily.   . traMADol (ULTRAM) 50 MG tablet TAKE 1 TABLET (50 MG TOTAL) BY MOUTH EVERY 8 (EIGHT) HOURS AS NEEDED FOR PAIN.   No current facility-administered medications on file prior to visit.      Allergies  Allergen Reactions  . Cetirizine Hcl     REACTION: not effective  . Erythromycin     REACTION: stomach hurts  . Escitalopram Oxalate     REACTION: no improvement  . Lisinopril     REACTION: cough  . Loratadine     REACTION: not effective  . Nifedipine     REACTION: fatigued and tremor  . Sertraline Hcl     REACTION: no help  . Vyvanse [Lisdexamfetamine Dimesylate]     Headache and excitablility    Review Of Systems:  Constitutional:   No  weight loss, night sweats,  Fevers, chills, fatigue, or  lassitude.  HEENT:   + headaches,  No  Difficulty swallowing,  Tooth/dental problems, or  Sore throat,                No sneezing, itching, ear ache, nasal congestion, + post nasal drip,   CV:  No chest pain,  Orthopnea, PND, swelling in lower extremities, anasarca, dizziness, palpitations, syncope.   GI  No heartburn, indigestion, abdominal pain, nausea, vomiting, diarrhea, change in bowel habits, loss of appetite, bloody stools.   Resp: No shortness of breath  with exertion or at rest.  No excess mucus, no productive cough,  No non-productive cough,  No coughing up of blood.  No change in color of mucus.  No wheezing.  No chest wall deformity  Skin: no rash or lesions.  GU: no dysuria, change in color of urine, no urgency or frequency.  No flank pain, no hematuria   MS:  No joint pain or swelling.  No decreased range of motion.  No back pain.  Psych:  No change in mood or affect. No depression or anxiety.  No memory loss.   Vital Signs BP 136/78 (BP Location: Left Arm, Cuff Size: Normal)   Pulse (!) 106   Ht 5\' 8"  (1.727 m)   Wt 225 lb 6.4 oz (102.2 kg)   LMP 08/05/2006   SpO2 96%   BMI 34.27 kg/m    Physical Exam:  General- No distress,  A&Ox3, pleasant ENT: No sinus tenderness, TM clear, pale nasal mucosa, no oral exudate,+ post nasal drip, no LAN Cardiac: S1, S2, regular rate and rhythm, no murmur Chest: No wheeze/ rales/ dullness; no accessory muscle use, no nasal flaring, no sternal retractions Abd.: Soft Non-tender, NT, ND BS + Ext: No clubbing cyanosis, edema Neuro:  normal strength, MAE x 4, A&O x 3 Skin: No rashes, warm and dry Psych: normal mood and behavior   Assessment/Plan  Obstructive sleep apnea Continue on CPAP at bedtime. You appear to be benefiting from the treatment Goal is to wear for at least 6 hours each night for maximal clinical benefit. Continue to work on weight loss, as the link between excess weight  and sleep apnea is well established.  Do not drive if sleepy. Remember to  clean mask, tubing, filter, and reservoir once weekly with soapy water.  Follow up with Dr. Annamaria Boots In 1 year  or before as needed.  Please contact office for sooner follow up if symptoms do not improve or worsen or seek emergency care      Magdalen Spatz, NP 09/01/2017  2:48 PM

## 2017-09-01 NOTE — Assessment & Plan Note (Signed)
Continue on CPAP at bedtime. You appear to be benefiting from the treatment Goal is to wear for at least 6 hours each night for maximal clinical benefit. Continue to work on weight loss, as the link between excess weight  and sleep apnea is well established.  Do not drive if sleepy. Remember to clean mask, tubing, filter, and reservoir once weekly with soapy water.  Follow up with Dr. Annamaria Boots In 1 year  or before as needed.  Please contact office for sooner follow up if symptoms do not improve or worsen or seek emergency care

## 2017-09-01 NOTE — Patient Instructions (Signed)
It is nice to meet you today. Continue on CPAP at bedtime. You appear to be benefiting from the treatment Goal is to wear for at least 6 hours each night for maximal clinical benefit. Continue to work on weight loss, as the link between excess weight  and sleep apnea is well established.  Do not drive if sleepy. Remember to clean mask, tubing, filter, and reservoir once weekly with soapy water.  Follow up with Dr. Annamaria Boots In 1 year  or before as needed.  Please contact office for sooner follow up if symptoms do not improve or worsen or seek emergency care

## 2017-09-17 ENCOUNTER — Encounter: Payer: Self-pay | Admitting: Family Medicine

## 2017-09-17 ENCOUNTER — Ambulatory Visit (INDEPENDENT_AMBULATORY_CARE_PROVIDER_SITE_OTHER)
Admission: RE | Admit: 2017-09-17 | Discharge: 2017-09-17 | Disposition: A | Discharge status: Home / Self Care | Payer: Medicare HMO | Source: Ambulatory Visit | Attending: Family Medicine | Admitting: Family Medicine

## 2017-09-17 ENCOUNTER — Ambulatory Visit (INDEPENDENT_AMBULATORY_CARE_PROVIDER_SITE_OTHER): Payer: Medicare HMO | Admitting: Family Medicine

## 2017-09-17 VITALS — BP 140/68 | HR 80 | Temp 98.2°F | Ht 68.0 in | Wt 224.5 lb

## 2017-09-17 DIAGNOSIS — M79672 Pain in left foot: Secondary | ICD-10-CM

## 2017-09-17 DIAGNOSIS — M7989 Other specified soft tissue disorders: Secondary | ICD-10-CM | POA: Diagnosis not present

## 2017-09-17 DIAGNOSIS — S99922A Unspecified injury of left foot, initial encounter: Secondary | ICD-10-CM | POA: Diagnosis not present

## 2017-09-17 DIAGNOSIS — Z23 Encounter for immunization: Secondary | ICD-10-CM | POA: Diagnosis not present

## 2017-09-17 NOTE — Patient Instructions (Addendum)
Ice and elevate your foot whenever you can  Tylenol for discomfort  Wear post op shoe when walking - as needed   Alert me if swelling does not start to improve in a few days   We will call with xray reading

## 2017-09-17 NOTE — Progress Notes (Signed)
Subjective:    Patient ID: [name], female    DOB: [birth year], 52 y.o.   MRN: [id number]  HPI Here for L foot injury -had injured it twice   Wants flu shot today    BP Readings from Last 3 Encounters:  09/17/17 140/68  09/01/17 136/78  08/10/17 122/74   Wt Readings from Last 3 Encounters:  09/17/17 224 lb 8 oz (101.8 kg)  09/01/17 225 lb 6.4 oz (102.2 kg)  08/10/17 223 lb 3.2 oz (101.2 kg)     3-4 weeks ago- stubbed her L 5th toe and heard a pop  Thought she might of broken it  The lateral fot continued to hurt   Yesterday- a board fell on top of foot / heavy  Left a red mark  Swollen and bruised now  Quite sore   Xray today Dg Foot Complete Left  Result Date: 09/17/2017 CLINICAL DATA:  Injury.  Swelling. EXAM: LEFT FOOT - COMPLETE 3+ VIEW COMPARISON:  None. FINDINGS: Metal hardware is present in the first metatarsal head. No acute fracture or dislocation. Soft tissue swelling in the foot is present. Spurring of the posterior and inferior calcaneus. IMPRESSION: No acute bony pathology. Electronically Signed   By: Marybelle Killings M.D.   On: 09/17/2017 15:18     Patient Active Problem List   Diagnosis Date Noted  . Left foot pain 09/17/2017  . Moderate persistent asthma, uncomplicated 77/82/4235  . Chronic rhinitis 08/10/2017  . Cough 06/06/2017  . Welcome to Medicare preventive visit 04/20/2017  . Multiple leg contusions 01/05/2017  . Skin lesion 11/02/2016  . Chronic low back pain 12/09/2015  . Degeneration of lumbar or lumbosacral intervertebral disc 12/09/2015  . Elevated TSH 01/24/2014  . Pseudophakia of both eyes 12/15/2013  . Nasal septal deviation 01/03/2013  . Encounter for Medicare annual wellness exam 11/02/2012  . Hydradenitis 11/02/2012  . Cystoid macular degeneration of retina 05/13/2012  . Hyperglycemia 03/23/2012  . Macular hole 03/23/2012  . Screening-pulmonary TB 03/23/2012  . Routine general medical examination at a health care facility  10/02/2011  . Iritis, recurrent 10/02/2011  . Tachycardia 10/02/2011  . ADD (attention deficit disorder) 08/20/2010  . DYSHIDROTIC ECZEMA, HANDS 11/12/2009  . Obstructive sleep apnea 11/19/2006  . DEPRESSION 09/29/2006  . Essential hypertension 09/29/2006  . Seasonal and perennial allergic rhinitis 09/29/2006  . Asthma, mild intermittent, well-controlled 09/29/2006  . GERD 09/29/2006  . IBS 09/29/2006  . ENDOMETRIOSIS 09/29/2006  . FEMALE INFERTILITY 09/29/2006  . Fibromyalgia 09/29/2006  . INSOMNIA 09/29/2006   Past Medical History:  Diagnosis Date  . Allergy    allergic rhinitis  . Asthma   . Chronic headaches   . Degenerative disc disease   . Depression   . Fibromyalgia   . Fx of fibula 2009  . GERD (gastroesophageal reflux disease)   . Hemorrhoids   . Hypertension   . IBS (irritable bowel syndrome)   . Iritis, chronic   . Macular hole of right eye   . Sleep apnea    Past Surgical History:  Procedure Laterality Date  . ABDOMINAL HYSTERECTOMY    . ANTERIOR CRUCIATE LIGAMENT REPAIR  1996   small tear of ACL  . BREAST EXCISIONAL BIOPSY Left 1995  . BREAST SURGERY     left nipple removed - benign tumor  . BUNIONECTOMY    . LAPAROSCOPY  08/2004   endometriosis - several   . TONSILLECTOMY     Social History   Tobacco Use  .  Smoking status: Never Smoker  . Smokeless tobacco: Never Used  Substance Use Topics  . Alcohol use: No    Alcohol/week: 0.0 standard drinks  . Drug use: No   Family History  Problem Relation Age of Onset  . Hypertension Mother   . Thyroid disease Mother   . Fibromyalgia Mother   . Colon polyps Mother   . Diabetes Father   . Kidney cancer Maternal Grandmother   . Colon cancer Neg Hx    Allergies  Allergen Reactions  . Cetirizine Hcl     REACTION: not effective  . Erythromycin     REACTION: stomach hurts  . Escitalopram Oxalate     REACTION: no improvement  . Lisinopril     REACTION: cough  . Loratadine     REACTION: not  effective  . Nifedipine     REACTION: fatigued and tremor  . Sertraline Hcl     REACTION: no help  . Vyvanse [Lisdexamfetamine Dimesylate]     Headache and excitablility   Current Outpatient Medications on File Prior to Visit  Medication Sig Dispense Refill  . acetaminophen (TYLENOL) 500 MG tablet OTC as directed.     . Adalimumab (HUMIRA) 40 MG/0.8ML PSKT Inject 40 mg into the skin.     Marland Kitchen albuterol (PROVENTIL HFA;VENTOLIN HFA) 108 (90 Base) MCG/ACT inhaler Inhale 2 puffs into the lungs every 6 (six) hours as needed for wheezing or shortness of breath. (Patient taking differently: Inhale 2 puffs into the lungs every 6 (six) hours as needed for wheezing or shortness of breath.) 1 Inhaler 12  . albuterol (PROVENTIL) (2.5 MG/3ML) 0.083% nebulizer solution Take 3 mLs (2.5 mg total) by nebulization every 4 (four) hours as needed for wheezing or shortness of breath. 75 mL 1  . amLODipine (NORVASC) 5 MG tablet Take 1 tablet (5 mg total) by mouth daily. 90 tablet 3  . atropine 1 % ophthalmic solution Apply 1 drop to eye daily.    . budesonide-formoterol (SYMBICORT) 160-4.5 MCG/ACT inhaler Inhale 2 puffs into the lungs 2 (two) times daily. 1 Inhaler 0  . buPROPion (WELLBUTRIN XL) 300 MG 24 hr tablet Take 1 tablet (300 mg total) by mouth daily. 90 tablet 3  . carvedilol (COREG) 6.25 MG tablet Take 1 tablet (6.25 mg total) by mouth 2 (two) times daily with a meal. 180 tablet 3  . Cholecalciferol (VITAMIN D3 PO) Take 1 capsule by mouth daily.    . [employer] TRIPLE MAGNESIUM COMPLEX PO Take 1 capsule by mouth daily.    . cyclobenzaprine (FLEXERIL) 10 MG tablet Take 1 tablet (10 mg total) by mouth at bedtime as needed for muscle spasms (pain). (Patient taking differently: Take 1 tablet (10 mg total) by mouth at bedtime as needed for muscle spasms (pain).) 30 tablet 0  . diphenhydrAMINE (BENADRYL) 25 mg capsule Take 25 mg by mouth as needed. OTC as directed.     Marland Kitchen esomeprazole (NEXIUM) 20 MG capsule Take 1 capsule  (20 mg total) by mouth daily at 12 noon. 90 capsule 3  . fluticasone (FLONASE) 50 MCG/ACT nasal spray Place 2 sprays into both nostrils daily.    . folic acid (FOLVITE) 1 MG tablet Take 3 mg by mouth 4 (four) times a week.     Marland Kitchen ibuprofen (ADVIL,MOTRIN) 200 MG tablet Take 200 mg by mouth every 6 (six) hours as needed.    Marland Kitchen KETOROLAC TROMETHAMINE OP Place 4 drops into both eyes daily.    Marland Kitchen leucovorin (WELLCOVORIN) 5 MG tablet  Take 5 mg by mouth 2 (two) times a week.    . methocarbamol (ROBAXIN) 500 MG tablet Take 500 mg by mouth 4 (four) times daily as needed for muscle spasms.    . methotrexate 50 MG/2ML injection Inject 25 mg into the vein once a week.    . montelukast (SINGULAIR) 10 MG tablet Take 1 tablet (10 mg total) by mouth at bedtime. 90 tablet 3  . Omega 3-6-9 Fatty Acids (OMEGA-3-6-9 PO) Take 1,000 mg by mouth 2 (two) times daily.    . prednisoLONE acetate (PRED FORTE) 1 % ophthalmic suspension Place 4 drops into both eyes daily.     . traMADol (ULTRAM) 50 MG tablet TAKE 1 TABLET (50 MG TOTAL) BY MOUTH EVERY 8 (EIGHT) HOURS AS NEEDED FOR PAIN. 30 tablet 3   No current facility-administered medications on file prior to visit.     Review of Systems  Constitutional: Negative for activity change, appetite change, fatigue, fever and unexpected weight change.  HENT: Negative for congestion, ear pain, rhinorrhea, sinus pressure and sore throat.   Eyes: Negative for pain, redness and visual disturbance.  Respiratory: Negative for cough, shortness of breath and wheezing.   Cardiovascular: Negative for chest pain and palpitations.  Gastrointestinal: Negative for abdominal pain, blood in stool, constipation and diarrhea.  Endocrine: Negative for polydipsia and polyuria.  Genitourinary: Negative for dysuria, frequency and urgency.  Musculoskeletal: Positive for arthralgias. Negative for back pain and myalgias.       L foot pain and swelling   Skin: Negative for pallor and rash.    Allergic/Immunologic: Negative for environmental allergies.  Neurological: Negative for dizziness, syncope and headaches.  Hematological: Negative for adenopathy. Does not bruise/bleed easily.  Psychiatric/Behavioral: Negative for decreased concentration and dysphoric mood. The patient is not nervous/anxious.        Objective:   Physical Exam  Musculoskeletal:       Left foot: There is tenderness, bony tenderness and swelling. There is normal range of motion, no crepitus, no deformity and no laceration.  Diffuse swelling of dorsal foot with ecchymosis  Tender over 2-5th MTPs Nl rom toes /no toe tenderness Nl perf/sensation  Favors R extremity with gait             Assessment & Plan:   Problem List Items Addressed This Visit      Other   Left foot pain - Primary    After several injuries  No fx on xray  Given post op shoe for ambulation Elevation/ice  Tylenol prn / she does not want addnl med Update if not starting to improve in a week or if worsening        Relevant Orders   DG Foot Complete Left (Completed)    Other Visit Diagnoses    Need for influenza vaccination       Relevant Orders   Flu Vaccine QUAD 6+ mos PF IM (Fluarix Quad PF) (Completed)

## 2017-09-18 NOTE — Assessment & Plan Note (Signed)
After several injuries  No fx on xray  Given post op shoe for ambulation Elevation/ice  Tylenol prn / she does not want addnl med Update if not starting to improve in a week or if worsening

## 2017-09-22 DIAGNOSIS — G4733 Obstructive sleep apnea (adult) (pediatric): Secondary | ICD-10-CM | POA: Diagnosis not present

## 2017-10-22 DIAGNOSIS — G4733 Obstructive sleep apnea (adult) (pediatric): Secondary | ICD-10-CM | POA: Diagnosis not present

## 2017-10-29 ENCOUNTER — Encounter: Payer: Self-pay | Admitting: Internal Medicine

## 2017-10-29 ENCOUNTER — Ambulatory Visit (INDEPENDENT_AMBULATORY_CARE_PROVIDER_SITE_OTHER): Payer: Medicare HMO | Admitting: Internal Medicine

## 2017-10-29 VITALS — BP 128/84 | HR 76 | Temp 98.6°F | Wt 221.0 lb

## 2017-10-29 DIAGNOSIS — M7989 Other specified soft tissue disorders: Secondary | ICD-10-CM | POA: Diagnosis not present

## 2017-10-29 NOTE — Progress Notes (Signed)
Subjective:    Patient ID: [name], female    DOB: 1965-08-05, 52 y.o.   MRN: [id number]  HPI  Pt presents to the clinic today with c/o right foot swelling. She reports this started 1-2 weeks ago. It is intermittent. She denies any redness, warmth or bruising to the area. She denies numbness, tingling or weakness. She denies any injury to the area. She has tried elevation with some relief.  Review of Systems  Past Medical History:  Diagnosis Date  . Allergy    allergic rhinitis  . Asthma   . Chronic headaches   . Degenerative disc disease   . Depression   . Fibromyalgia   . Fx of fibula 2009  . GERD (gastroesophageal reflux disease)   . Hemorrhoids   . Hypertension   . IBS (irritable bowel syndrome)   . Iritis, chronic   . Macular hole of right eye   . Sleep apnea     Current Outpatient Medications  Medication Sig Dispense Refill  . acetaminophen (TYLENOL) 500 MG tablet OTC as directed.     . Adalimumab (HUMIRA) 40 MG/0.8ML PSKT Inject 40 mg into the skin.     Marland Kitchen albuterol (PROVENTIL HFA;VENTOLIN HFA) 108 (90 Base) MCG/ACT inhaler Inhale 2 puffs into the lungs every 6 (six) hours as needed for wheezing or shortness of breath. (Patient taking differently: Inhale 2 puffs into the lungs every 6 (six) hours as needed for wheezing or shortness of breath.) 1 Inhaler 12  . albuterol (PROVENTIL) (2.5 MG/3ML) 0.083% nebulizer solution Take 3 mLs (2.5 mg total) by nebulization every 4 (four) hours as needed for wheezing or shortness of breath. 75 mL 1  . amLODipine (NORVASC) 5 MG tablet Take 1 tablet (5 mg total) by mouth daily. 90 tablet 3  . atropine 1 % ophthalmic solution Apply 1 drop to eye daily.    . budesonide-formoterol (SYMBICORT) 160-4.5 MCG/ACT inhaler Inhale 2 puffs into the lungs 2 (two) times daily. 1 Inhaler 0  . buPROPion (WELLBUTRIN XL) 300 MG 24 hr tablet Take 1 tablet (300 mg total) by mouth daily. 90 tablet 3  . carvedilol (COREG) 6.25 MG tablet Take 1  tablet (6.25 mg total) by mouth 2 (two) times daily with a meal. 180 tablet 3  . Cholecalciferol (VITAMIN D3 PO) Take 1 capsule by mouth daily.    . [employer] TRIPLE MAGNESIUM COMPLEX PO Take 1 capsule by mouth daily.    . cyclobenzaprine (FLEXERIL) 10 MG tablet Take 1 tablet (10 mg total) by mouth at bedtime as needed for muscle spasms (pain). (Patient taking differently: Take 1 tablet (10 mg total) by mouth at bedtime as needed for muscle spasms (pain).) 30 tablet 0  . diphenhydrAMINE (BENADRYL) 25 mg capsule Take 25 mg by mouth as needed. OTC as directed.     Marland Kitchen esomeprazole (NEXIUM) 20 MG capsule Take 1 capsule (20 mg total) by mouth daily at 12 noon. 90 capsule 3  . fluticasone (FLONASE) 50 MCG/ACT nasal spray Place 2 sprays into both nostrils daily.    . folic acid (FOLVITE) 1 MG tablet Take 3 mg by mouth 4 (four) times a week.     Marland Kitchen ibuprofen (ADVIL,MOTRIN) 200 MG tablet Take 200 mg by mouth every 6 (six) hours as needed.    Marland Kitchen KETOROLAC TROMETHAMINE OP Place 4 drops into both eyes daily.    Marland Kitchen leucovorin (WELLCOVORIN) 5 MG tablet Take 5 mg by mouth 2 (two) times a week.    Marland Kitchen  methocarbamol (ROBAXIN) 500 MG tablet Take 500 mg by mouth 4 (four) times daily as needed for muscle spasms.    . methotrexate 50 MG/2ML injection Inject 25 mg into the vein once a week.    . montelukast (SINGULAIR) 10 MG tablet Take 1 tablet (10 mg total) by mouth at bedtime. 90 tablet 3  . Omega 3-6-9 Fatty Acids (OMEGA-3-6-9 PO) Take 1,000 mg by mouth 2 (two) times daily.    . prednisoLONE acetate (PRED FORTE) 1 % ophthalmic suspension Place 4 drops into both eyes daily.     . traMADol (ULTRAM) 50 MG tablet TAKE 1 TABLET (50 MG TOTAL) BY MOUTH EVERY 8 (EIGHT) HOURS AS NEEDED FOR PAIN. 30 tablet 3   No current facility-administered medications for this visit.     Allergies  Allergen Reactions  . Cetirizine Hcl     REACTION: not effective  . Erythromycin     REACTION: stomach hurts  . Escitalopram Oxalate      REACTION: no improvement  . Lisinopril     REACTION: cough  . Loratadine     REACTION: not effective  . Nifedipine     REACTION: fatigued and tremor  . Sertraline Hcl     REACTION: no help  . Vyvanse [Lisdexamfetamine Dimesylate]     Headache and excitablility    Family History  Problem Relation Age of Onset  . Hypertension Mother   . Thyroid disease Mother   . Fibromyalgia Mother   . Colon polyps Mother   . Diabetes Father   . Kidney cancer Maternal Grandmother   . Colon cancer Neg Hx     Social History   Socioeconomic History  . Marital status: Divorced    Spouse name: Not on file  . Number of children: 1  . Years of education: Not on file  . Highest education level: Not on file  Occupational History  . Occupation: Disabled due to fibromyalgia    Employer: UNEMPLOYED  Social Needs  . Financial resource strain: Not on file  . Food insecurity:    Worry: Not on file    Inability: Not on file  . Transportation needs:    Medical: Not on file    Non-medical: Not on file  Tobacco Use  . Smoking status: Never Smoker  . Smokeless tobacco: Never Used  Substance and Sexual Activity  . Alcohol use: No    Alcohol/week: 0.0 standard drinks  . Drug use: No  . Sexual activity: Never  Lifestyle  . Physical activity:    Days per week: Not on file    Minutes per session: Not on file  . Stress: Not on file  Relationships  . Social connections:    Talks on phone: Not on file    Gets together: Not on file    Attends religious service: Not on file    Active member of club or organization: Not on file    Attends meetings of clubs or organizations: Not on file    Relationship status: Not on file  . Intimate partner violence:    Fear of current or ex partner: Not on file    Emotionally abused: Not on file    Physically abused: Not on file    Forced sexual activity: Not on file  Other Topics Concern  . Not on file  Social History Narrative   1 caffeine drink daily       Constitutional: Denies fever, malaise, fatigue, headache or abrupt weight changes.  Respiratory: Denies  difficulty breathing, shortness of breath, cough or sputum production.   Cardiovascular: Pt reports right foot swelling. Denies chest pain, chest tightness, palpitations or swelling in the hands.  Musculoskeletal: Denies decrease in range of motion, difficulty with gait, muscle pain or joint pain.  Skin: Denies redness, rashes, lesions or ulcercations.  Neurological: Denies dizziness, difficulty with memory, difficulty with speech or problems with balance and coordination.    No other specific complaints in a complete review of systems (except as listed in HPI above).     Objective:   Physical Exam  BP 128/84   Pulse 76   Temp 98.6 F (37 C) (Oral)   Wt 221 lb (100.2 kg)   LMP 08/05/2006   SpO2 98%   BMI 33.60 kg/m  Wt Readings from Last 3 Encounters:  10/29/17 221 lb (100.2 kg)  09/17/17 224 lb 8 oz (101.8 kg)  09/01/17 225 lb 6.4 oz (102.2 kg)    General: Appears her stated age, well developed, well nourished in NAD. Skin: Warm, dry and intact. No redness, warmth noted of right foot. Cardiovascular: Normal rate and rhythm. S1,S2 noted.  No murmur, rubs or gallops noted. 1+ nonpitting edema of right foot. Pedal pulses present bilaterally. Pulmonary/Chest: Normal effort and positive vesicular breath sounds. No respiratory distress. No wheezes, rales or ronchi noted.  Musculoskeletal: Normal flexion, extension and rotation of the right ankle. No pain with palpation of the right ankle. No difficulty with gait.  Neurological: Alert and oriented.   BMET    Component Value Date/Time   NA 141 05/29/2017 1137   K 3.6 05/29/2017 1137   CL 103 05/29/2017 1137   CO2 30 04/15/2017 0745   GLUCOSE 95 05/29/2017 1137   BUN 13 05/29/2017 1137   CREATININE 0.80 05/29/2017 1137   CREATININE 0.96 10/02/2011 1615   CALCIUM 8.7 04/15/2017 0745   GFRNONAA 74 03/16/2007 0940    GFRAA 90 03/16/2007 0940    Lipid Panel     Component Value Date/Time   CHOL 166 04/15/2017 0745   TRIG 77.0 04/15/2017 0745   HDL 59.10 04/15/2017 0745   CHOLHDL 3 04/15/2017 0745   VLDL 15.4 04/15/2017 0745   LDLCALC 92 04/15/2017 0745    CBC    Component Value Date/Time   WBC 6.2 04/15/2017 0745   RBC 4.82 04/15/2017 0745   HGB 15.0 05/29/2017 1137   HCT 44.0 05/29/2017 1137   PLT 248.0 04/15/2017 0745   MCV 92.0 04/15/2017 0745   MCH 28.4 10/02/2011 1615   MCHC 33.2 04/15/2017 0745   RDW 15.1 04/15/2017 0745   LYMPHSABS 2.8 04/15/2017 0745   MONOABS 0.5 04/15/2017 0745   EOSABS 0.3 04/15/2017 0745   BASOSABS 0.1 04/15/2017 0745    Hgb A1C Lab Results  Component Value Date   HGBA1C 6.4 04/15/2017            Assessment & Plan:   Swelling of Right Foot:  ? SE from Amlodipine Advised her to hold for 1 week, then follow up with PCP to discuss alternative therapy Encouraged elevation Discussed low salt diet No indication for xray at this time  Return precautions discussed Webb Silversmith, NP

## 2017-10-29 NOTE — Patient Instructions (Signed)
Edema Edema is when you have too much fluid in your body or under your skin. Edema may make your legs, feet, and ankles swell up. Swelling is also common in looser tissues, like around your eyes. This is a common condition. It gets more common as you get older. There are many possible causes of edema. Eating too much salt (sodium) and being on your feet or sitting for a long time can cause edema in your legs, feet, and ankles. Hot weather may make edema worse. Edema is usually painless. Your skin may look swollen or shiny. Follow these instructions at home:  Keep the swollen body part raised (elevated) above the level of your heart when you are sitting or lying down.  Do not sit still or stand for a long time.  Do not wear tight clothes. Do not wear garters on your upper legs.  Exercise your legs. This can help the swelling go down.  Wear elastic bandages or support stockings as told by your doctor.  Eat a low-salt (low-sodium) diet to reduce fluid as told by your doctor.  Depending on the cause of your swelling, you may need to limit how much fluid you drink (fluid restriction).  Take over-the-counter and prescription medicines only as told by your doctor. Contact a doctor if:  Treatment is not working.  You have heart, liver, or kidney disease and have symptoms of edema.  You have sudden and unexplained weight gain. Get help right away if:  You have shortness of breath or chest pain.  You cannot breathe when you lie down.  You have pain, redness, or warmth in the swollen areas.  You have heart, liver, or kidney disease and get edema all of a sudden.  You have a fever and your symptoms get worse all of a sudden. Summary  Edema is when you have too much fluid in your body or under your skin.  Edema may make your legs, feet, and ankles swell up. Swelling is also common in looser tissues, like around your eyes.  Raise (elevate) the swollen body part above the level of your  heart when you are sitting or lying down.  Follow your doctor's instructions about diet and how much fluid you can drink (fluid restriction). This information is not intended to replace advice given to you by your health care provider. Make sure you discuss any questions you have with your health care provider. Document Released: 06/10/2007 Document Revised: 01/10/2016 Document Reviewed: 01/10/2016 Elsevier Interactive Patient Education  2017 [employer]  

## 2017-10-30 DIAGNOSIS — R600 Localized edema: Secondary | ICD-10-CM | POA: Insufficient documentation

## 2017-10-30 DIAGNOSIS — R609 Edema, unspecified: Secondary | ICD-10-CM | POA: Insufficient documentation

## 2017-11-08 ENCOUNTER — Ambulatory Visit: Payer: Medicare HMO | Admitting: Internal Medicine

## 2017-11-09 ENCOUNTER — Ambulatory Visit: Payer: Medicare HMO | Admitting: Allergy & Immunology

## 2017-11-10 ENCOUNTER — Ambulatory Visit: Payer: Medicare HMO | Admitting: Allergy & Immunology

## 2017-11-15 ENCOUNTER — Encounter: Payer: Self-pay | Admitting: Family Medicine

## 2017-11-15 ENCOUNTER — Ambulatory Visit (INDEPENDENT_AMBULATORY_CARE_PROVIDER_SITE_OTHER): Payer: Medicare HMO | Admitting: Family Medicine

## 2017-11-15 VITALS — BP 144/90 | HR 103 | Temp 98.6°F | Ht 68.0 in | Wt 220.2 lb

## 2017-11-15 DIAGNOSIS — I10 Essential (primary) hypertension: Secondary | ICD-10-CM

## 2017-11-15 DIAGNOSIS — R609 Edema, unspecified: Secondary | ICD-10-CM | POA: Diagnosis not present

## 2017-11-15 DIAGNOSIS — R600 Localized edema: Secondary | ICD-10-CM

## 2017-11-15 MED ORDER — HYDROCHLOROTHIAZIDE 12.5 MG PO CAPS: 12.5000 mg | ORAL_CAPSULE | Freq: Every day | ORAL | 11 refills | Status: DC

## 2017-11-15 NOTE — Patient Instructions (Addendum)
Continue the carvedilol   Elevate feet when you sit when you can  Support stocking help also   Avoid excess sodium   Add hctz 12.5 mg daily in am  Alert me if side effects  Follow up in 2-3 weeks (we will do labs that day)  Continue to limit sodium

## 2017-11-15 NOTE — Assessment & Plan Note (Signed)
Worse in R foot  Disc leg elevation and use of support stockings to the knee No CHF symptoms and re assuring exam  Px hctz 12.5 mg daily for this and HTN F/u in 2-3 weeks for visit and labs Update if side effects

## 2017-11-15 NOTE — Progress Notes (Signed)
Subjective:    Patient ID: [name], female    DOB: 12-10-1965, 52 y.o.   MRN: [id number]  HPI  Here for f/u of pedal edema   L forefoot pain/swelling/ecchymosis after injury-improved and unrelated  Xray showed no fx   Seen again by NP Baity on 10/25 with c/o right foot swelling  Amlodipine was held as potential cause This improved symptoms by 50%  BP Readings from Last 3 Encounters:  11/15/17 (!) 144/90  10/29/17 128/84  09/17/17 140/68   Pulse Readings from Last 3 Encounters:  11/15/17 (!) 103  10/29/17 76  09/17/17 80   Lisinopril and losartan both caused cough    Wt Readings from Last 3 Encounters:  11/15/17 220 lb 4 oz (99.9 kg)  10/29/17 221 lb (100.2 kg)  09/17/17 224 lb 8 oz (101.8 kg)   33.49 kg/m   Today - off amlodipine  50% better Unsure about left foot  R foot feels tight   Still takes carvedilol     Not on any oral prednisone now  Also occ motrin Torrie Mayers -not lately   No sob on exertion /PND or orthopnea    Lab Results  Component Value Date   CREATININE 0.80 05/29/2017   BUN 13 05/29/2017   NA 141 05/29/2017   K 3.6 05/29/2017   CL 103 05/29/2017   CO2 30 04/15/2017   Patient Active Problem List   Diagnosis Date Noted  . Peripheral edema 10/30/2017  . Left foot pain 09/17/2017  . Moderate persistent asthma, uncomplicated 58/85/0277  . Chronic rhinitis 08/10/2017  . Cough 06/06/2017  . Welcome to Medicare preventive visit 04/20/2017  . Multiple leg contusions 01/05/2017  . Skin lesion 11/02/2016  . Chronic low back pain 12/09/2015  . Degeneration of lumbar or lumbosacral intervertebral disc 12/09/2015  . Elevated TSH 01/24/2014  . Pseudophakia of both eyes 12/15/2013  . Nasal septal deviation 01/03/2013  . Encounter for Medicare annual wellness exam 11/02/2012  . Hydradenitis 11/02/2012  . Cystoid macular degeneration of retina 05/13/2012  . Hyperglycemia 03/23/2012  . Macular hole 03/23/2012  . Screening-pulmonary TB  03/23/2012  . Routine general medical examination at a health care facility 10/02/2011  . Iritis, recurrent 10/02/2011  . Tachycardia 10/02/2011  . ADD (attention deficit disorder) 08/20/2010  . DYSHIDROTIC ECZEMA, HANDS 11/12/2009  . Obstructive sleep apnea 11/19/2006  . DEPRESSION 09/29/2006  . Essential hypertension 09/29/2006  . Seasonal and perennial allergic rhinitis 09/29/2006  . Asthma, mild intermittent, well-controlled 09/29/2006  . GERD 09/29/2006  . IBS 09/29/2006  . ENDOMETRIOSIS 09/29/2006  . FEMALE INFERTILITY 09/29/2006  . Fibromyalgia 09/29/2006  . INSOMNIA 09/29/2006   Past Medical History:  Diagnosis Date  . Allergy    allergic rhinitis  . Asthma   . Chronic headaches   . Degenerative disc disease   . Depression   . Fibromyalgia   . Fx of fibula 2009  . GERD (gastroesophageal reflux disease)   . Hemorrhoids   . Hypertension   . IBS (irritable bowel syndrome)   . Iritis, chronic   . Macular hole of right eye   . Sleep apnea    Past Surgical History:  Procedure Laterality Date  . ABDOMINAL HYSTERECTOMY    . ANTERIOR CRUCIATE LIGAMENT REPAIR  1996   small tear of ACL  . BREAST EXCISIONAL BIOPSY Left 1995  . BREAST SURGERY     left nipple removed - benign tumor  . BUNIONECTOMY    . LAPAROSCOPY  08/2004  endometriosis - several   . TONSILLECTOMY     Social History   Tobacco Use  . Smoking status: Never Smoker  . Smokeless tobacco: Never Used  Substance Use Topics  . Alcohol use: No    Alcohol/week: 0.0 standard drinks  . Drug use: No   Family History  Problem Relation Age of Onset  . Hypertension Mother   . Thyroid disease Mother   . Fibromyalgia Mother   . Colon polyps Mother   . Diabetes Father   . Kidney cancer Maternal Grandmother   . Colon cancer Neg Hx    Allergies  Allergen Reactions  . Amlodipine     Pedal edema   . Cetirizine Hcl     REACTION: not effective  . Erythromycin     REACTION: stomach hurts  . Escitalopram  Oxalate     REACTION: no improvement  . Lisinopril     REACTION: cough  . Loratadine     REACTION: not effective  . Nifedipine     REACTION: fatigued and tremor  . Sertraline Hcl     REACTION: no help  . Vyvanse [Lisdexamfetamine Dimesylate]     Headache and excitablility   Current Outpatient Medications on File Prior to Visit  Medication Sig Dispense Refill  . acetaminophen (TYLENOL) 500 MG tablet OTC as directed.     . Adalimumab (HUMIRA) 40 MG/0.8ML PSKT Inject 40 mg into the skin.     Marland Kitchen albuterol (PROVENTIL HFA;VENTOLIN HFA) 108 (90 Base) MCG/ACT inhaler Inhale 2 puffs into the lungs every 6 (six) hours as needed for wheezing or shortness of breath. (Patient taking differently: Inhale 2 puffs into the lungs every 6 (six) hours as needed for wheezing or shortness of breath.) 1 Inhaler 12  . albuterol (PROVENTIL) (2.5 MG/3ML) 0.083% nebulizer solution Take 3 mLs (2.5 mg total) by nebulization every 4 (four) hours as needed for wheezing or shortness of breath. 75 mL 1  . amLODipine (NORVASC) 5 MG tablet Take 1 tablet (5 mg total) by mouth daily. 90 tablet 3  . atropine 1 % ophthalmic solution Apply 1 drop to eye daily.    . budesonide-formoterol (SYMBICORT) 160-4.5 MCG/ACT inhaler Inhale 2 puffs into the lungs 2 (two) times daily. 1 Inhaler 0  . buPROPion (WELLBUTRIN XL) 300 MG 24 hr tablet Take 1 tablet (300 mg total) by mouth daily. 90 tablet 3  . carvedilol (COREG) 6.25 MG tablet Take 1 tablet (6.25 mg total) by mouth 2 (two) times daily with a meal. 180 tablet 3  . Cholecalciferol (VITAMIN D3 PO) Take 1 capsule by mouth daily.    . [employer] TRIPLE MAGNESIUM COMPLEX PO Take 1 capsule by mouth daily.    . cyclobenzaprine (FLEXERIL) 10 MG tablet Take 1 tablet (10 mg total) by mouth at bedtime as needed for muscle spasms (pain). (Patient taking differently: Take 1 tablet (10 mg total) by mouth at bedtime as needed for muscle spasms (pain).) 30 tablet 0  . diphenhydrAMINE (BENADRYL) 25 mg  capsule Take 25 mg by mouth as needed. OTC as directed.     Marland Kitchen esomeprazole (NEXIUM) 20 MG capsule Take 1 capsule (20 mg total) by mouth daily at 12 noon. 90 capsule 3  . fluticasone (FLONASE) 50 MCG/ACT nasal spray Place 2 sprays into both nostrils daily.    . folic acid (FOLVITE) 1 MG tablet Take 3 mg by mouth 4 (four) times a week.     Marland Kitchen ibuprofen (ADVIL,MOTRIN) 200 MG tablet Take 200 mg by mouth  every 6 (six) hours as needed.    Marland Kitchen KETOROLAC TROMETHAMINE OP Place 4 drops into both eyes daily.    Marland Kitchen leucovorin (WELLCOVORIN) 5 MG tablet Take 5 mg by mouth 2 (two) times a week.    . methocarbamol (ROBAXIN) 500 MG tablet Take 500 mg by mouth 4 (four) times daily as needed for muscle spasms.    . methotrexate 50 MG/2ML injection Inject 25 mg into the vein once a week.    . montelukast (SINGULAIR) 10 MG tablet Take 1 tablet (10 mg total) by mouth at bedtime. 90 tablet 3  . Omega 3-6-9 Fatty Acids (OMEGA-3-6-9 PO) Take 1,000 mg by mouth 2 (two) times daily.    . prednisoLONE acetate (PRED FORTE) 1 % ophthalmic suspension Place 4 drops into both eyes daily.     . traMADol (ULTRAM) 50 MG tablet TAKE 1 TABLET (50 MG TOTAL) BY MOUTH EVERY 8 (EIGHT) HOURS AS NEEDED FOR PAIN. 30 tablet 3   No current facility-administered medications on file prior to visit.      Review of Systems  Constitutional: Negative for activity change, appetite change, fatigue, fever and unexpected weight change.  HENT: Negative for congestion, ear pain, rhinorrhea, sinus pressure and sore throat.   Eyes: Negative for pain, redness and visual disturbance.  Respiratory: Negative for cough, shortness of breath and wheezing.   Cardiovascular: Positive for leg swelling. Negative for chest pain and palpitations.  Gastrointestinal: Negative for abdominal pain, blood in stool, constipation and diarrhea.  Endocrine: Negative for polydipsia and polyuria.  Genitourinary: Negative for dysuria, frequency and urgency.  Musculoskeletal:  Negative for arthralgias, back pain and myalgias.  Skin: Negative for pallor and rash.  Allergic/Immunologic: Negative for environmental allergies.  Neurological: Negative for dizziness, syncope and headaches.  Hematological: Negative for adenopathy. Does not bruise/bleed easily.  Psychiatric/Behavioral: Negative for decreased concentration and dysphoric mood. The patient is not nervous/anxious.        Objective:   Physical Exam  Constitutional: She appears well-developed and well-nourished. No distress.  obese and well appearing   HENT:  Head: Normocephalic and atraumatic.  Mouth/Throat: Oropharynx is clear and moist.  Eyes: Pupils are equal, round, and reactive to light. Conjunctivae and EOM are normal. No scleral icterus.  Neck: Normal range of motion. Neck supple. No JVD present. Carotid bruit is not present. No thyromegaly present.  Cardiovascular: Normal rate, regular rhythm, normal heart sounds and intact distal pulses. Exam reveals no gallop.  Pulmonary/Chest: Effort normal and breath sounds normal. No respiratory distress. She has no wheezes. She has no rales.  No crackles  Abdominal: Soft. Bowel sounds are normal. She exhibits no distension, no abdominal bruit and no mass.  No abdominal swelling  Musculoskeletal: She exhibits edema. She exhibits no tenderness.  Trace to 1 plus edema bilateral ankles -worse on R  No calf tenderness/palp cord/warmth or redness Nl peripheral pulses   Lymphadenopathy:    She has no cervical adenopathy.  Neurological: She is alert. She has normal reflexes. She displays normal reflexes. No cranial nerve deficit. Coordination normal.  Skin: Skin is warm and dry. No rash noted. No erythema. No pallor.  Psychiatric: She has a normal mood and affect.          Assessment & Plan:   Problem List Items Addressed This Visit      Cardiovascular and Mediastinum   Essential hypertension    Up off amlodipine -which was causing swelling  BP: (!)  144/90    Will add low  dose hctz 12.5 mg daily -for both bp and fluid retention  F/u planned Disc DASH eating as well       Relevant Medications   hydrochlorothiazide (MICROZIDE) 12.5 MG capsule     Other   Peripheral edema - Primary    Worse in R foot  Disc leg elevation and use of support stockings to the knee No CHF symptoms and re assuring exam  Px hctz 12.5 mg daily for this and HTN F/u in 2-3 weeks for visit and labs Update if side effects

## 2017-11-15 NOTE — Assessment & Plan Note (Signed)
Up off amlodipine -which was causing swelling  BP: (!) 144/90    Will add low dose hctz 12.5 mg daily -for both bp and fluid retention  F/u planned Disc DASH eating as well

## 2017-11-19 DIAGNOSIS — H35353 Cystoid macular degeneration, bilateral: Secondary | ICD-10-CM | POA: Diagnosis not present

## 2017-11-19 DIAGNOSIS — Z79899 Other long term (current) drug therapy: Secondary | ICD-10-CM | POA: Diagnosis not present

## 2017-11-19 DIAGNOSIS — H35342 Macular cyst, hole, or pseudohole, left eye: Secondary | ICD-10-CM | POA: Diagnosis not present

## 2017-11-19 DIAGNOSIS — H2013 Chronic iridocyclitis, bilateral: Secondary | ICD-10-CM | POA: Diagnosis not present

## 2017-11-19 DIAGNOSIS — H20029 Recurrent acute iridocyclitis, unspecified eye: Secondary | ICD-10-CM | POA: Diagnosis not present

## 2017-11-19 DIAGNOSIS — H20023 Recurrent acute iridocyclitis, bilateral: Secondary | ICD-10-CM | POA: Diagnosis not present

## 2017-11-19 DIAGNOSIS — Z961 Presence of intraocular lens: Secondary | ICD-10-CM | POA: Diagnosis not present

## 2017-11-22 DIAGNOSIS — G4733 Obstructive sleep apnea (adult) (pediatric): Secondary | ICD-10-CM | POA: Diagnosis not present

## 2017-11-26 DIAGNOSIS — J0140 Acute pansinusitis, unspecified: Secondary | ICD-10-CM | POA: Diagnosis not present

## 2017-12-06 ENCOUNTER — Telehealth: Payer: Self-pay

## 2017-12-06 MED ORDER — BUDESONIDE-FORMOTEROL FUMARATE 160-4.5 MCG/ACT IN AERO: 2.0000 | INHALATION_SPRAY | Freq: Two times a day (BID) | RESPIRATORY_TRACT | 1 refills | Status: DC

## 2017-12-06 NOTE — Telephone Encounter (Signed)
Patient would like to go ahead and get a prescription for Symbicort sent to [facility].

## 2017-12-06 NOTE — Telephone Encounter (Signed)
Refill sent in lvm advising rx sent in

## 2017-12-07 ENCOUNTER — Ambulatory Visit: Payer: Medicare HMO | Admitting: Internal Medicine

## 2017-12-08 ENCOUNTER — Encounter: Payer: Self-pay | Admitting: Family Medicine

## 2017-12-08 ENCOUNTER — Ambulatory Visit (INDEPENDENT_AMBULATORY_CARE_PROVIDER_SITE_OTHER): Payer: Medicare HMO | Admitting: Family Medicine

## 2017-12-08 VITALS — BP 134/82 | HR 85 | Temp 98.9°F | Ht 68.0 in | Wt 220.0 lb

## 2017-12-08 DIAGNOSIS — R609 Edema, unspecified: Secondary | ICD-10-CM | POA: Diagnosis not present

## 2017-12-08 DIAGNOSIS — I10 Essential (primary) hypertension: Secondary | ICD-10-CM | POA: Diagnosis not present

## 2017-12-08 DIAGNOSIS — H35342 Macular cyst, hole, or pseudohole, left eye: Secondary | ICD-10-CM | POA: Diagnosis not present

## 2017-12-08 LAB — RENAL FUNCTION PANEL
Albumin: 4.1 g/dL (ref 3.5–5.2)
BUN: 12 mg/dL (ref 6–23)
CALCIUM: 9.3 mg/dL (ref 8.4–10.5)
CO2: 29 mEq/L (ref 19–32)
Chloride: 102 mEq/L (ref 96–112)
Creatinine, Ser: 1 mg/dL (ref 0.40–1.20)
GFR: 62.26 mL/min (ref >60.00)
GLUCOSE: 132 mg/dL — AB (ref 70–99)
POTASSIUM: 3.6 meq/L (ref 3.5–5.1)
Phosphorus: 4 mg/dL (ref 2.3–4.6)
SODIUM: 138 meq/L (ref 135–145)

## 2017-12-08 NOTE — Assessment & Plan Note (Signed)
Now problems with L eye Will likely have surgery this mo

## 2017-12-08 NOTE — Patient Instructions (Addendum)
Labs today  Blood pressure is improved   Support socks to the knee may help swelling  Elevate feet when you sit (when you can)  Drink water and avoid excessive sodium from processed foods   Try to get most of your carbohydrates from produce (with the exception of white potatoes)  Eat less bread/pasta/rice/snack foods/cereals/sweets and other items from the middle of the grocery store (processed carbs)

## 2017-12-08 NOTE — Assessment & Plan Note (Signed)
After intolerance of amlodipine (edema) -tried hctz 12.5 mg  BP: 134/82  Controlled  Disc lifestyle change Renal panel today (per pt her cr slt higher at [facility])

## 2017-12-08 NOTE — Assessment & Plan Note (Signed)
Baseline worse in R foot -still much better then when she was on amlodipine  Adv use of supp socks to knee Leg elevation  Avoid excessive processed foods Drink water Renal panel today  No pitting on exam

## 2017-12-08 NOTE — Progress Notes (Signed)
Subjective:    Patient ID: [name], female    DOB: 11/07/1965, 52 y.o.   MRN: [id number]  HPI  Here for f/u of pedal edema and HTN   Amlodipine had caused pedal edema and was stopped  Last visit added 12.5 mg hctz for bp and edema  Also rec support socks and elevation   Wt Readings from Last 3 Encounters:  12/08/17 220 lb (99.8 kg)  11/15/17 220 lb 4 oz (99.9 kg)  10/29/17 221 lb (100.2 kg)   BP Readings from Last 3 Encounters:  12/08/17 134/82  11/15/17 (!) 144/90  10/29/17 128/84    Lab Results  Component Value Date   CREATININE 0.80 05/29/2017   BUN 13 05/29/2017   NA 141 05/29/2017   K 3.6 05/29/2017   CL 103 05/29/2017   CO2 30 04/15/2017   Edema is about the same  Always worse on the R   Noted her cr was up to 1 at [facility]  Will repeat labs today   Has to have surgery on other eye   Patient Active Problem List   Diagnosis Date Noted  . Peripheral edema 10/30/2017  . Moderate persistent asthma, uncomplicated 50/27/7412  . Chronic rhinitis 08/10/2017  . Cough 06/06/2017  . Welcome to Medicare preventive visit 04/20/2017  . Skin lesion 11/02/2016  . Chronic low back pain 12/09/2015  . Degeneration of lumbar or lumbosacral intervertebral disc 12/09/2015  . Elevated TSH 01/24/2014  . Pseudophakia of both eyes 12/15/2013  . Nasal septal deviation 01/03/2013  . Encounter for Medicare annual wellness exam 11/02/2012  . Hydradenitis 11/02/2012  . Cystoid macular degeneration of retina 05/13/2012  . Hyperglycemia 03/23/2012  . Macular hole 03/23/2012  . Screening-pulmonary TB 03/23/2012  . Routine general medical examination at a health care facility 10/02/2011  . Iritis, recurrent 10/02/2011  . Tachycardia 10/02/2011  . ADD (attention deficit disorder) 08/20/2010  . DYSHIDROTIC ECZEMA, HANDS 11/12/2009  . Obstructive sleep apnea 11/19/2006  . DEPRESSION 09/29/2006  . Essential hypertension 09/29/2006  . Seasonal and perennial allergic rhinitis  09/29/2006  . Asthma, mild intermittent, well-controlled 09/29/2006  . GERD 09/29/2006  . IBS 09/29/2006  . ENDOMETRIOSIS 09/29/2006  . FEMALE INFERTILITY 09/29/2006  . Fibromyalgia 09/29/2006  . INSOMNIA 09/29/2006   Past Medical History:  Diagnosis Date  . Allergy    allergic rhinitis  . Asthma   . Chronic headaches   . Degenerative disc disease   . Depression   . Fibromyalgia   . Fx of fibula 2009  . GERD (gastroesophageal reflux disease)   . Hemorrhoids   . Hypertension   . IBS (irritable bowel syndrome)   . Iritis, chronic   . Macular hole of right eye   . Sleep apnea    Past Surgical History:  Procedure Laterality Date  . ABDOMINAL HYSTERECTOMY    . ANTERIOR CRUCIATE LIGAMENT REPAIR  1996   small tear of ACL  . BREAST EXCISIONAL BIOPSY Left 1995  . BREAST SURGERY     left nipple removed - benign tumor  . BUNIONECTOMY    . LAPAROSCOPY  08/2004   endometriosis - several   . TONSILLECTOMY     Social History   Tobacco Use  . Smoking status: Never Smoker  . Smokeless tobacco: Never Used  Substance Use Topics  . Alcohol use: No    Alcohol/week: 0.0 standard drinks  . Drug use: No   Family History  Problem Relation Age of Onset  . Hypertension Mother   .  Thyroid disease Mother   . Fibromyalgia Mother   . Colon polyps Mother   . Diabetes Father   . Kidney cancer Maternal Grandmother   . Colon cancer Neg Hx    Allergies  Allergen Reactions  . Amlodipine     Pedal edema   . Cetirizine Hcl     REACTION: not effective  . Erythromycin     REACTION: stomach hurts  . Escitalopram Oxalate     REACTION: no improvement  . Lisinopril     REACTION: cough  . Loratadine     REACTION: not effective  . Nifedipine     REACTION: fatigued and tremor  . Sertraline Hcl     REACTION: no help  . Vyvanse [Lisdexamfetamine Dimesylate]     Headache and excitablility   Current Outpatient Medications on File Prior to Visit  Medication Sig Dispense Refill  .  acetaminophen (TYLENOL) 500 MG tablet OTC as directed.     . Adalimumab (HUMIRA) 40 MG/0.8ML PSKT Inject 40 mg into the skin once a week.     Marland Kitchen albuterol (PROVENTIL HFA;VENTOLIN HFA) 108 (90 Base) MCG/ACT inhaler Inhale 2 puffs into the lungs every 6 (six) hours as needed for wheezing or shortness of breath. (Patient taking differently: Inhale 2 puffs into the lungs every 6 (six) hours as needed for wheezing or shortness of breath.) 1 Inhaler 12  . albuterol (PROVENTIL) (2.5 MG/3ML) 0.083% nebulizer solution Take 3 mLs (2.5 mg total) by nebulization every 4 (four) hours as needed for wheezing or shortness of breath. 75 mL 1  . amLODipine (NORVASC) 5 MG tablet Take 1 tablet (5 mg total) by mouth daily. 90 tablet 3  . atropine 1 % ophthalmic solution Apply 1 drop to eye daily.    . budesonide-formoterol (SYMBICORT) 160-4.5 MCG/ACT inhaler Inhale 2 puffs into the lungs 2 (two) times daily. 1 Inhaler 1  . buPROPion (WELLBUTRIN XL) 300 MG 24 hr tablet Take 1 tablet (300 mg total) by mouth daily. 90 tablet 3  . carvedilol (COREG) 6.25 MG tablet Take 1 tablet (6.25 mg total) by mouth 2 (two) times daily with a meal. 180 tablet 3  . Cholecalciferol (VITAMIN D3 PO) Take 1 capsule by mouth daily.    . [employer] TRIPLE MAGNESIUM COMPLEX PO Take 1 capsule by mouth daily.    . cyclobenzaprine (FLEXERIL) 10 MG tablet Take 1 tablet (10 mg total) by mouth at bedtime as needed for muscle spasms (pain). (Patient taking differently: Take 1 tablet (10 mg total) by mouth at bedtime as needed for muscle spasms (pain).) 30 tablet 0  . diphenhydrAMINE (BENADRYL) 25 mg capsule Take 25 mg by mouth as needed. OTC as directed.     Marland Kitchen esomeprazole (NEXIUM) 20 MG capsule Take 1 capsule (20 mg total) by mouth daily at 12 noon. 90 capsule 3  . fluticasone (FLONASE) 50 MCG/ACT nasal spray Place 2 sprays into both nostrils daily.    . folic acid (FOLVITE) 1 MG tablet Take 3 mg by mouth 4 (four) times a week.     . hydrochlorothiazide  (MICROZIDE) 12.5 MG capsule Take 1 capsule (12.5 mg total) by mouth daily. 30 capsule 11  . KETOROLAC TROMETHAMINE OP Place 4 drops into both eyes daily.    Marland Kitchen leucovorin (WELLCOVORIN) 5 MG tablet Take 5 mg by mouth 2 (two) times a week.    . methocarbamol (ROBAXIN) 500 MG tablet Take 500 mg by mouth 4 (four) times daily as needed for muscle spasms.    Marland Kitchen  methotrexate 50 MG/2ML injection Inject 25 mg into the vein once a week.    . montelukast (SINGULAIR) 10 MG tablet Take 1 tablet (10 mg total) by mouth at bedtime. 90 tablet 3  . Omega 3-6-9 Fatty Acids (OMEGA-3-6-9 PO) Take 1,000 mg by mouth 2 (two) times daily.    . prednisoLONE acetate (PRED FORTE) 1 % ophthalmic suspension Place 4 drops into both eyes daily.     . traMADol (ULTRAM) 50 MG tablet TAKE 1 TABLET (50 MG TOTAL) BY MOUTH EVERY 8 (EIGHT) HOURS AS NEEDED FOR PAIN. 30 tablet 3   No current facility-administered medications on file prior to visit.     Review of Systems  Constitutional: Negative for activity change, appetite change, fatigue, fever and unexpected weight change.  HENT: Negative for congestion, ear pain, rhinorrhea, sinus pressure and sore throat.   Eyes: Positive for photophobia and visual disturbance. Negative for pain and redness.       L eye is worse now  Respiratory: Negative for cough, shortness of breath and wheezing.   Cardiovascular: Negative for chest pain and palpitations.  Gastrointestinal: Negative for abdominal pain, blood in stool, constipation and diarrhea.  Endocrine: Negative for polydipsia and polyuria.  Genitourinary: Negative for dysuria, frequency and urgency.  Musculoskeletal: Negative for arthralgias, back pain and myalgias.  Skin: Negative for pallor and rash.  Allergic/Immunologic: Negative for environmental allergies.  Neurological: Negative for dizziness, syncope and headaches.  Hematological: Negative for adenopathy. Does not bruise/bleed easily.  Psychiatric/Behavioral: Negative for  decreased concentration and dysphoric mood. The patient is not nervous/anxious.        Objective:   Physical Exam  Constitutional: She appears well-developed and well-nourished. No distress.  obese and well appearing   HENT:  Head: Normocephalic and atraumatic.  Mouth/Throat: Oropharynx is clear and moist.  Eyes: Pupils are equal, round, and reactive to light. Conjunctivae and EOM are normal.  Neck: Normal range of motion. Neck supple. No JVD present. Carotid bruit is not present. No thyromegaly present.  Cardiovascular: Normal rate, regular rhythm, normal heart sounds and intact distal pulses. Exam reveals no gallop.  Pulmonary/Chest: Effort normal and breath sounds normal. No respiratory distress. She has no wheezes. She has no rales.  No crackles  Abdominal: She exhibits no abdominal bruit.  Musculoskeletal: She exhibits edema. She exhibits no tenderness or deformity.  Very mild soft tissue puffiness in R ankle more than L  (sock line in both) No pitting Much improved from when she was on amlodipine   Lymphadenopathy:    She has no cervical adenopathy.  Neurological: She is alert. She has normal reflexes. She displays normal reflexes. No cranial nerve deficit. Coordination normal.  Skin: Skin is warm and dry. No rash noted.  Psychiatric: She has a normal mood and affect.  Pleasant           Assessment & Plan:   Problem List Items Addressed This Visit      Cardiovascular and Mediastinum   Essential hypertension - Primary    After intolerance of amlodipine (edema) -tried hctz 12.5 mg  BP: 134/82  Controlled  Disc lifestyle change Renal panel today (per pt her cr slt higher at [facility])       Relevant Orders   Renal function panel     Other   Peripheral edema    Baseline worse in R foot -still much better then when she was on amlodipine  Adv use of supp socks to knee Leg elevation  Avoid excessive processed  foods Drink water Renal panel today  No pitting on exam        Relevant Orders   Renal function panel   Macular hole    Now problems with L eye Will likely have surgery this mo

## 2017-12-09 ENCOUNTER — Ambulatory Visit: Payer: Medicare HMO | Admitting: Internal Medicine

## 2017-12-10 ENCOUNTER — Ambulatory Visit: Payer: Medicare HMO | Admitting: Allergy & Immunology

## 2017-12-22 DIAGNOSIS — G4733 Obstructive sleep apnea (adult) (pediatric): Secondary | ICD-10-CM | POA: Diagnosis not present

## 2018-01-14 DIAGNOSIS — H35353 Cystoid macular degeneration, bilateral: Secondary | ICD-10-CM | POA: Diagnosis not present

## 2018-01-14 DIAGNOSIS — H35342 Macular cyst, hole, or pseudohole, left eye: Secondary | ICD-10-CM | POA: Diagnosis not present

## 2018-01-14 DIAGNOSIS — Z79899 Other long term (current) drug therapy: Secondary | ICD-10-CM | POA: Diagnosis not present

## 2018-01-14 DIAGNOSIS — H20023 Recurrent acute iridocyclitis, bilateral: Secondary | ICD-10-CM | POA: Diagnosis not present

## 2018-01-14 DIAGNOSIS — H2013 Chronic iridocyclitis, bilateral: Secondary | ICD-10-CM | POA: Diagnosis not present

## 2018-01-14 DIAGNOSIS — Z961 Presence of intraocular lens: Secondary | ICD-10-CM | POA: Diagnosis not present

## 2018-01-19 DIAGNOSIS — H0011 Chalazion right upper eyelid: Secondary | ICD-10-CM | POA: Diagnosis not present

## 2018-01-22 DIAGNOSIS — G4733 Obstructive sleep apnea (adult) (pediatric): Secondary | ICD-10-CM | POA: Diagnosis not present

## 2018-02-14 DIAGNOSIS — D2271 Melanocytic nevi of right lower limb, including hip: Secondary | ICD-10-CM | POA: Diagnosis not present

## 2018-02-14 DIAGNOSIS — Z85828 Personal history of other malignant neoplasm of skin: Secondary | ICD-10-CM | POA: Diagnosis not present

## 2018-02-14 DIAGNOSIS — L858 Other specified epidermal thickening: Secondary | ICD-10-CM | POA: Diagnosis not present

## 2018-02-14 DIAGNOSIS — L720 Epidermal cyst: Secondary | ICD-10-CM | POA: Diagnosis not present

## 2018-02-14 DIAGNOSIS — D1801 Hemangioma of skin and subcutaneous tissue: Secondary | ICD-10-CM | POA: Diagnosis not present

## 2018-02-14 DIAGNOSIS — D225 Melanocytic nevi of trunk: Secondary | ICD-10-CM | POA: Diagnosis not present

## 2018-02-14 DIAGNOSIS — L821 Other seborrheic keratosis: Secondary | ICD-10-CM | POA: Diagnosis not present

## 2018-02-22 DIAGNOSIS — G4733 Obstructive sleep apnea (adult) (pediatric): Secondary | ICD-10-CM | POA: Diagnosis not present

## 2018-03-18 DIAGNOSIS — H20023 Recurrent acute iridocyclitis, bilateral: Secondary | ICD-10-CM | POA: Diagnosis not present

## 2018-03-18 DIAGNOSIS — H20029 Recurrent acute iridocyclitis, unspecified eye: Secondary | ICD-10-CM | POA: Diagnosis not present

## 2018-03-18 DIAGNOSIS — H35353 Cystoid macular degeneration, bilateral: Secondary | ICD-10-CM | POA: Diagnosis not present

## 2018-03-18 DIAGNOSIS — Z79899 Other long term (current) drug therapy: Secondary | ICD-10-CM | POA: Diagnosis not present

## 2018-03-18 DIAGNOSIS — H35342 Macular cyst, hole, or pseudohole, left eye: Secondary | ICD-10-CM | POA: Diagnosis not present

## 2018-03-18 DIAGNOSIS — Z961 Presence of intraocular lens: Secondary | ICD-10-CM | POA: Diagnosis not present

## 2018-03-23 DIAGNOSIS — G4733 Obstructive sleep apnea (adult) (pediatric): Secondary | ICD-10-CM | POA: Diagnosis not present

## 2018-04-05 DIAGNOSIS — G4733 Obstructive sleep apnea (adult) (pediatric): Secondary | ICD-10-CM | POA: Diagnosis not present

## 2018-04-06 ENCOUNTER — Ambulatory Visit: Payer: Medicare HMO | Admitting: Allergy & Immunology

## 2018-04-08 DIAGNOSIS — G4733 Obstructive sleep apnea (adult) (pediatric): Secondary | ICD-10-CM | POA: Diagnosis not present

## 2018-04-14 ENCOUNTER — Ambulatory Visit (INDEPENDENT_AMBULATORY_CARE_PROVIDER_SITE_OTHER): Payer: Medicare HMO | Admitting: Family Medicine

## 2018-04-14 ENCOUNTER — Ambulatory Visit: Payer: Medicare HMO | Admitting: Family Medicine

## 2018-04-14 ENCOUNTER — Encounter: Payer: Self-pay | Admitting: Family Medicine

## 2018-04-14 ENCOUNTER — Other Ambulatory Visit: Payer: Self-pay

## 2018-04-14 ENCOUNTER — Telehealth: Payer: Self-pay | Admitting: Family Medicine

## 2018-04-14 ENCOUNTER — Ambulatory Visit (HOSPITAL_COMMUNITY)
Admission: RE | Admit: 2018-04-14 | Discharge: 2018-04-14 | Disposition: A | Discharge status: Home / Self Care | Payer: Medicare HMO | Source: Ambulatory Visit | Attending: Family Medicine | Admitting: Family Medicine

## 2018-04-14 VITALS — BP 148/86 | HR 104 | Temp 98.2°F | Ht 68.0 in | Wt 218.4 lb

## 2018-04-14 DIAGNOSIS — R1031 Right lower quadrant pain: Secondary | ICD-10-CM | POA: Diagnosis not present

## 2018-04-14 DIAGNOSIS — R109 Unspecified abdominal pain: Secondary | ICD-10-CM | POA: Diagnosis not present

## 2018-04-14 LAB — COMPREHENSIVE METABOLIC PANEL
ALT: 17 U/L (ref 0–35)
AST: 27 U/L (ref 0–37)
Albumin: 4 g/dL (ref 3.5–5.2)
Alkaline Phosphatase: 90 U/L (ref 39–117)
BUN: 10 mg/dL (ref 6–23)
CO2: 26 mEq/L (ref 19–32)
Calcium: 9.2 mg/dL (ref 8.4–10.5)
Chloride: 103 mEq/L (ref 96–112)
Creatinine, Ser: 0.92 mg/dL (ref 0.40–1.20)
GFR: 64.4 mL/min (ref >60.00)
Glucose, Bld: 120 mg/dL — ABNORMAL HIGH (ref 70–99)
Potassium: 4.2 mEq/L (ref 3.5–5.1)
Sodium: 137 mEq/L (ref 135–145)
Total Bilirubin: 1.1 mg/dL (ref 0.2–1.2)
Total Protein: 7 g/dL (ref 6.0–8.3)

## 2018-04-14 LAB — CBC WITH DIFFERENTIAL/PLATELET
Basophils Absolute: 0.1 10*3/uL (ref 0.0–0.1)
Basophils Relative: 1 % (ref 0.0–3.0)
Eosinophils Absolute: 0.4 10*3/uL (ref 0.0–0.7)
Eosinophils Relative: 4.2 % (ref 0.0–5.0)
HCT: 44.6 % (ref 36.0–46.0)
Hemoglobin: 15.1 g/dL — ABNORMAL HIGH (ref 12.0–15.0)
Lymphocytes Relative: 26.7 % (ref 12.0–46.0)
Lymphs Abs: 2.6 10*3/uL (ref 0.7–4.0)
MCHC: 33.9 g/dL (ref 30.0–36.0)
MCV: 92.4 fl (ref 78.0–100.0)
Monocytes Absolute: 0.6 10*3/uL (ref 0.1–1.0)
Monocytes Relative: 6.5 % (ref 3.0–12.0)
Neutro Abs: 6.1 10*3/uL (ref 1.4–7.7)
Neutrophils Relative %: 61.6 % (ref 43.0–77.0)
Platelets: 243 10*3/uL (ref 150.0–400.0)
RBC: 4.83 Mil/uL (ref 3.87–5.11)
RDW: 14.9 % (ref 11.5–15.5)
WBC: 9.9 10*3/uL (ref 4.0–10.5)

## 2018-04-14 LAB — LIPASE: Lipase: 14 U/L (ref 11.0–59.0)

## 2018-04-14 MED ORDER — IOHEXOL 300 MG/ML  SOLN
100.0000 mL | Freq: Once | INTRAMUSCULAR | Status: AC | PRN
  Administered 2018-04-14: 100 mL via INTRAVENOUS

## 2018-04-14 MED ORDER — PREDNISONE 20 MG PO TABS: ORAL_TABLET | ORAL | 0 refills | Status: DC

## 2018-04-14 NOTE — Progress Notes (Signed)
Subjective:    Patient ID: [name], female    DOB: Jun 06, 1965, 53 y.o.   MRN: [id number]  HPI Here for symptom of painful knot in belly button  She takes humira and methotrexate  Wt Readings from Last 3 Encounters:  04/14/18 218 lb 6 oz (99.1 kg)  12/08/17 220 lb (99.8 kg)  11/15/17 220 lb 4 oz (99.9 kg)   33.20 kg/m   Woke up with it 2 d ago  Took gas ex  Had a miserable night last night   Knot in belly button - pain is in area and around it  Yesterday to the right -now that is better  No skin redness  ? If she has an umbilical hernia  Hurts to cough/bend over/walk and do steps She does heavy lifting/furniture - usual   No stool changes  No blood   No n/v   No difference with eating  Appetite is ok   No fever   Pulse is high today Pulse Readings from Last 3 Encounters:  04/14/18 (!) 104  12/08/17 85  11/15/17 (!) 103   Also did not take bp med today-carvedilol   Had water and slim fast     Review of Systems  Constitutional: Positive for fatigue. Negative for activity change, appetite change, chills, diaphoresis, fever and unexpected weight change.  HENT: Negative for sore throat.   Eyes: Negative for photophobia, discharge and visual disturbance.  Respiratory: Negative for cough, chest tightness, shortness of breath and wheezing.   Cardiovascular: Negative for chest pain, palpitations and leg swelling.  Gastrointestinal: Positive for abdominal pain. Negative for anal bleeding, constipation, diarrhea, nausea, rectal pain and vomiting.  Genitourinary: Negative for dysuria, frequency and hematuria.  Neurological: Negative for dizziness.  Hematological: Negative for adenopathy. Does not bruise/bleed easily.       Objective:   Physical Exam Constitutional:      Appearance: Normal appearance. She is obese. She is not ill-appearing.  HENT:     Head: Normocephalic and atraumatic.     Mouth/Throat:     Mouth: Mucous membranes are moist.   Pharynx: Oropharynx is clear.  Eyes:     General: No scleral icterus.    Extraocular Movements: Extraocular movements intact.     Conjunctiva/sclera: Conjunctivae normal.     Pupils: Pupils are equal, round, and reactive to light.  Neck:     Musculoskeletal: Normal range of motion and neck supple.  Cardiovascular:     Rate and Rhythm: Regular rhythm. Tachycardia present.     Heart sounds: Normal heart sounds.  Pulmonary:     Effort: Pulmonary effort is normal. No respiratory distress.     Breath sounds: Normal breath sounds. No wheezing or rales.  Abdominal:     General: Abdomen is flat. Bowel sounds are normal. There is no distension or abdominal bruit.     Palpations: Abdomen is soft. There is no shifting dullness, fluid wave, hepatomegaly, splenomegaly, mass or pulsatile mass.     Tenderness: There is abdominal tenderness in the right lower quadrant and epigastric area. There is rebound. There is no right CVA tenderness, left CVA tenderness or guarding. Negative signs include Murphy's sign and psoas sign.     Hernia: No hernia is present.     Comments: Tender in RLQ primarily  Rebound tenderness noted  Pain on moving pt and shaking bed  No M or hernia noted   Musculoskeletal:     Right lower leg: No edema.  Left lower leg: No edema.  Lymphadenopathy:     Cervical: No cervical adenopathy.  Skin:    Coloration: Skin is not jaundiced or pale.     Findings: No erythema or rash.  Neurological:     Mental Status: She is alert. Mental status is at baseline.  Psychiatric:        Mood and Affect: Mood normal.           Assessment & Plan:   Problem List Items Addressed This Visit      Other   Abdominal pain, right lower quadrant - Primary    Pain in RLQ and around umbilicus  Rebound tenderness noted No M noted  Pt has had laparoscopies for endometriosis in the past - ? Scar tissue Stat labs ordered CT ordered       Relevant Orders   CBC with Differential/Platelet    Comprehensive metabolic panel   Lipase   CT Abdomen Pelvis W Contrast

## 2018-04-14 NOTE — Patient Instructions (Signed)
Labs now We will work on setting up a CT scan to rule out appendicitis or other acute abdominal process   If symptoms suddenly worsen get to the ER

## 2018-04-14 NOTE — Assessment & Plan Note (Signed)
Pain in RLQ and around umbilicus  Rebound tenderness noted No M noted  Pt has had laparoscopies for endometriosis in the past - ? Scar tissue Stat labs ordered CT ordered

## 2018-04-14 NOTE — Telephone Encounter (Signed)
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

## 2018-04-18 NOTE — Telephone Encounter (Signed)
Pt said her pain level is better. The pain is not completely gone but it's better since last visit and she doesn't have any new sxs

## 2018-04-18 NOTE — Telephone Encounter (Signed)
Pt notified of Dr. Tower's comments and instructions and verbalized understanding  

## 2018-04-18 NOTE — Telephone Encounter (Signed)
Good ! -please ask her to call us on Wednesday again with another update (earlier if needed if symptoms change)  Continue the prednisone

## 2018-04-18 NOTE — Telephone Encounter (Signed)
Please call and check in with her re: how she is feeling  I sent in prednisone on Thursday for her abd pain  Thanks

## 2018-04-20 ENCOUNTER — Telehealth: Payer: Self-pay | Admitting: Family Medicine

## 2018-04-20 NOTE — Telephone Encounter (Signed)
Copied from [place] [phone]. Topic: Quick Communication - See Telephone Encounter >> Apr 20, 2018  4:18 PM Nils Flack wrote: CRM for notification. See Telephone encounter for: 04/20/18. Pt is calling to give an update per Provider instruction -  Pt is feeling much better and not in any pain  Cb is [phone]

## 2018-04-21 NOTE — Telephone Encounter (Signed)
Thanks for the update- glad to hear that  Alert me if symptoms come back at any time Thanks

## 2018-04-23 DIAGNOSIS — G4733 Obstructive sleep apnea (adult) (pediatric): Secondary | ICD-10-CM | POA: Diagnosis not present

## 2018-05-11 ENCOUNTER — Encounter: Payer: Self-pay | Admitting: Allergy & Immunology

## 2018-05-11 ENCOUNTER — Ambulatory Visit: Payer: Medicare HMO | Admitting: Allergy & Immunology

## 2018-05-11 ENCOUNTER — Other Ambulatory Visit: Payer: Self-pay

## 2018-05-11 VITALS — BP 154/102 | HR 93 | Temp 98.4°F | Resp 16

## 2018-05-11 DIAGNOSIS — J310 Chronic rhinitis: Secondary | ICD-10-CM

## 2018-05-11 DIAGNOSIS — J4540 Moderate persistent asthma, uncomplicated: Secondary | ICD-10-CM | POA: Diagnosis not present

## 2018-05-11 NOTE — Patient Instructions (Addendum)
1. Moderate persistent asthma, uncomplicated - We did not do testing since this has been show to spread the coronavirus so effectively.  - But it seems that your medications are working well.  - You did have elevated eosinophils in April 2020, so you would qualify for one of our injectable medications (information on Nucala provided).  - Daily controller medication(s): Symbicort 160/4.50mcg two puffs twice daily with spacer - Prior to physical activity: ProAir 2 puffs 10-15 minutes before physical activity. - Rescue medications: ProAir 4 puffs every 4-6 hours as needed or albuterol nebulizer one vial every 4-6 hours as needed - Asthma control goals:  * Full participation in all desired activities (may need albuterol before activity) * Albuterol use two time or less a week on average (not counting use with activity) * Cough interfering with sleep two time or less a month * Oral steroids no more than once a year * No hospitalizations  2. Chronic rhinitis  - Continue with cetirizine 10mg  daily.  - Continue with fluticasone 1-2 sprays per nostril daily.  3. Return in about 6 months (around 11/11/2018). This can be an in-person, a virtual Webex or a telephone follow up visit.   Please inform us of any Emergency Department visits, hospitalizations, or changes in symptoms. Call us before going to the ED for breathing or allergy symptoms since we might be able to fit you in for a sick visit. Feel free to contact us anytime with any questions, problems, or concerns.  It was a pleasure to see you again today!  Websites that have reliable patient information: 1. American Academy of Asthma, Allergy, and Immunology: www.aaaai.org 2. Food Allergy Research and Education (FARE): foodallergy.org 3. Mothers of Asthmatics: http://www.asthmacommunitynetwork.org 4. American College of Allergy, Asthma, and Immunology: www.acaai.org  "Like" [place] on Facebook and Instagram for our latest updates!      Make  sure you are registered to vote! If you have moved or changed any of your contact information, you will need to get this updated before voting!    Voter ID laws are NOT going into effect for the General Election in November 2020! DO NOT let this stop you from exercising your right to vote!

## 2018-05-11 NOTE — Progress Notes (Signed)
FOLLOW UP  Date of Service/Encounter:  05/11/18   Assessment:   Moderate persistent asthma, uncomplicated  Chronic rhinitis  Complicated past medical history including fibromyalgia, uveitis, and panniculitis   Asthma Reportables:  Severity: moderate persistent  Risk: high Control: not well controlled   Ms. [name] presents for a follow-up visit.  She has done fairly well since last time, but she does report waking up 2-3 times per night with coughing episodes and having shortness of breath 3-4 times per week.  While she does not use her rescue inhaler, this is clearly affecting her life.  She is compliant with her Symbicort, therefore her symptoms are not related to noncompliance.  Review of her labs show that her eosinophils have ranged from the 250 range up to 700.  Therefore, she would qualify for an anti-IL-5 medication.  Given her complicated past medical history, I would prefer to go with mepolizumab due to its longer safety profile.  We did provide information on the medication and I will have Tammy reach out to discuss initiation of this.   Plan/Recommendations:   1. Moderate persistent asthma, uncomplicated - We did not do testing since this has been show to spread the coronavirus so effectively.  - But it seems that your medications are working well.  - You did have elevated eosinophils in April 2020, so you would qualify for one of our injectable medications (information on Nucala provided).  - Daily controller medication(s): Symbicort 160/4.69mcg two puffs twice daily with spacer - Prior to physical activity: ProAir 2 puffs 10-15 minutes before physical activity. - Rescue medications: ProAir 4 puffs every 4-6 hours as needed or albuterol nebulizer one vial every 4-6 hours as needed - Asthma control goals:  * Full participation in all desired activities (may need albuterol before activity) * Albuterol use two time or less a week on average (not counting use with  activity) * Cough interfering with sleep two time or less a month * Oral steroids no more than once a year * No hospitalizations  2. Chronic rhinitis  - Continue with cetirizine 10mg  daily.  - Continue with fluticasone 1-2 sprays per nostril daily.  3. Return in about 6 months (around 11/11/2018). This can be an in-person, a virtual Webex or a telephone follow up visit.    Subjective:   [name] is a 53 y.o. female presenting today for follow up of  Chief Complaint  Patient presents with  . Asthma    [name] has a history of the following: Patient Active Problem List   Diagnosis Date Noted  . Abdominal pain, right lower quadrant 04/14/2018  . Peripheral edema 10/30/2017  . Moderate persistent asthma, uncomplicated 76/54/6503  . Chronic rhinitis 08/10/2017  . Cough 06/06/2017  . Welcome to Medicare preventive visit 04/20/2017  . Skin lesion 11/02/2016  . Chronic low back pain 12/09/2015  . Degeneration of lumbar or lumbosacral intervertebral disc 12/09/2015  . Elevated TSH 01/24/2014  . Pseudophakia of both eyes 12/15/2013  . Nasal septal deviation 01/03/2013  . Encounter for Medicare annual wellness exam 11/02/2012  . Hydradenitis 11/02/2012  . Cystoid macular degeneration of retina 05/13/2012  . Hyperglycemia 03/23/2012  . Macular hole 03/23/2012  . Screening-pulmonary TB 03/23/2012  . Routine general medical examination at a health care facility 10/02/2011  . Iritis, recurrent 10/02/2011  . Tachycardia 10/02/2011  . ADD (attention deficit disorder) 08/20/2010  . DYSHIDROTIC ECZEMA, HANDS 11/12/2009  . Obstructive sleep apnea 11/19/2006  . DEPRESSION 09/29/2006  .  Essential hypertension 09/29/2006  . Seasonal and perennial allergic rhinitis 09/29/2006  . Asthma, mild intermittent, well-controlled 09/29/2006  . GERD 09/29/2006  . IBS 09/29/2006  . ENDOMETRIOSIS 09/29/2006  . FEMALE INFERTILITY 09/29/2006  . Fibromyalgia 09/29/2006  . INSOMNIA  09/29/2006    History obtained from: chart review and patient.  [name] is a 53 y.o. female presenting for a follow up visit.  She was last seen in August 2009.  At that time, her lung testing looked great.  We did not make any medication changes.  We continued with Symbicort 160/4.5 mcg 2 puffs twice daily.  For her rhinitis, we held off on allergy testing.  We continued with antihistamines daily.  We also continued with fluticasone 1 to 2 sprays per nostril daily.  Prior to this, she had been on allergy shots with Dr. Annamaria Boots for 5 years.  She was also on allergy shots with Dr. Shaune Leeks for an unknown period of time.  Since the last visit, she has done well.  Asthma/Respiratory Symptom History: She remains on Symbicort two puffs twice daily. She did use the Ventolin 2-3 times in the last week. She reports that she wakes up coughing around twice per week on average. She has not needed prednisone for this. Her AEC was 400 in April 2020. She is short of breath multiple times during the week, but with the Symbicort she is definitely better than she was without it.   Allergic Rhinitis Symptom History: She has allergies that are well contorlled as long as she stays inside and on her cetirizine and fluticasone. She does miss being outdoors. She was able to be outdoors more when she was on her allergy shots. She did not want to do allergy shots again since she was on them at two different points in time.  She did have a CT scan one month ago and was diagnosed with an autoimmune disease. She was on prednisone for around one week. Her CT from April demonstrated mesenteric panniculitis.   She continues to be followed by Dr. Tomi Likens and Dr. Rosalita Chessman for ophthalmology. She remains on Humira. She is also still on methotrexate. These doses have been stable.   Otherwise, there have been no changes to her past medical history, surgical history, family history, or social history.  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  She also has disability due to her history of fibroma    Review of Systems  Constitutional: Negative.  Negative for fever, malaise/fatigue and weight loss.  HENT: Negative.  Negative for congestion, ear discharge and ear pain.   Eyes: Negative for pain, discharge and redness.  Respiratory: Negative for cough, sputum production, shortness of breath and wheezing.   Cardiovascular: Negative.  Negative for chest pain and palpitations.  Gastrointestinal: Negative for abdominal pain and heartburn.  Skin: Negative.  Negative for itching and rash.  Neurological: Negative for dizziness and headaches.  Endo/Heme/Allergies: Negative for environmental allergies. Does not bruise/bleed easily.       Objective:   Blood pressure (!) 154/102, pulse 93, temperature 98.4 F (36.9 C), temperature source Tympanic, resp. rate 16, last menstrual period 08/05/2006, SpO2 96 %. There is no height or weight on file to calculate BMI.   Physical Exam:  Physical Exam  Constitutional: She appears well-developed.  Pleasant female. Talkative.   HENT:  Head: Normocephalic and atraumatic.  Right Ear: Tympanic membrane, external ear and ear canal normal.  Left Ear: Tympanic membrane, external ear and ear canal  normal.  Nose: Mucosal edema and rhinorrhea present. No nasal deformity or septal deviation. No epistaxis. Right sinus exhibits no maxillary sinus tenderness and no frontal sinus tenderness. Left sinus exhibits no maxillary sinus tenderness and no frontal sinus tenderness.  Mouth/Throat: Uvula is midline and oropharynx is clear and moist. Mucous membranes are not pale and not dry.  Eyes: Pupils are equal, round, and reactive to light. Conjunctivae and EOM are normal. Right eye exhibits no chemosis and no discharge. Left eye exhibits no chemosis and no discharge. Right conjunctiva is not injected. Left conjunctiva is not injected.  Cardiovascular: Normal rate, regular rhythm and  normal heart sounds.  Respiratory: Effort normal and breath sounds normal. No accessory muscle usage. No tachypnea. No respiratory distress. She has no wheezes. She has no rhonchi. She has no rales. She exhibits no tenderness.  Lymphadenopathy:    She has no cervical adenopathy.  Neurological: She is alert.  Skin: No abrasion, no petechiae and no rash noted. Rash is not papular, not vesicular and not urticarial. No erythema. No pallor.  Psychiatric: She has a normal mood and affect.     Diagnostic studies: none   Salvatore Marvel, MD  [facility]

## 2018-05-12 ENCOUNTER — Encounter: Payer: Self-pay | Admitting: Gastroenterology

## 2018-05-13 ENCOUNTER — Telehealth: Payer: Self-pay | Admitting: *Deleted

## 2018-05-13 NOTE — Telephone Encounter (Signed)
L/M for patient to contact me discuss possible start on biologic.  She does have medicare advantage plan will need to see copays with her Humira that she has to pay to determine patient assistance. If we do Nucala patient assistance she will need to provide income and spend out to pharmacy at least 3% income.

## 2018-05-13 NOTE — Telephone Encounter (Signed)
-----   Message from Valentina Shaggy, MD sent at 05/11/2018  2:12 PM EDT ----- Possible Nucala start. We talked about it today and she has had elevated eosinophils for years. There is a level from last month and more in Care Everywhere. I prefer Nucala since she is rather complicated from a medical perspective.

## 2018-06-17 NOTE — Telephone Encounter (Signed)
L/m again for patient to call if she wants to discuss starting biologic for asthma

## 2018-06-22 NOTE — Telephone Encounter (Signed)
Received callback from patient and she does want to start Nucala. I did advised her that we would need to go through [facility] [place] for free drug for same to be affordable for her.  I will mail application to her today.

## 2018-07-01 DIAGNOSIS — H2013 Chronic iridocyclitis, bilateral: Secondary | ICD-10-CM | POA: Diagnosis not present

## 2018-07-01 DIAGNOSIS — H35353 Cystoid macular degeneration, bilateral: Secondary | ICD-10-CM | POA: Diagnosis not present

## 2018-07-01 DIAGNOSIS — H35342 Macular cyst, hole, or pseudohole, left eye: Secondary | ICD-10-CM | POA: Diagnosis not present

## 2018-07-01 DIAGNOSIS — Z961 Presence of intraocular lens: Secondary | ICD-10-CM | POA: Diagnosis not present

## 2018-07-01 DIAGNOSIS — H20023 Recurrent acute iridocyclitis, bilateral: Secondary | ICD-10-CM | POA: Diagnosis not present

## 2018-07-01 DIAGNOSIS — H20029 Recurrent acute iridocyclitis, unspecified eye: Secondary | ICD-10-CM | POA: Diagnosis not present

## 2018-07-01 DIAGNOSIS — Z79899 Other long term (current) drug therapy: Secondary | ICD-10-CM | POA: Diagnosis not present

## 2018-08-09 ENCOUNTER — Ambulatory Visit (INDEPENDENT_AMBULATORY_CARE_PROVIDER_SITE_OTHER): Payer: Medicare HMO | Admitting: Gastroenterology

## 2018-08-09 ENCOUNTER — Encounter: Payer: Self-pay | Admitting: Gastroenterology

## 2018-08-09 VITALS — Ht 68.0 in | Wt 215.0 lb

## 2018-08-09 DIAGNOSIS — K654 Sclerosing mesenteritis: Secondary | ICD-10-CM

## 2018-08-09 MED ORDER — PEG 3350-KCL-NA BICARB-NACL 420 G PO SOLR: 4000.0000 mL | ORAL | 0 refills | Status: DC

## 2018-08-09 NOTE — Progress Notes (Signed)
Review of pertinent gastrointestinal problems: 1. Routine risk for colon cancer.  Colonoscoy 04/2010 for lower abd pains, mother with colon polyps was normal; she was recommended to begin routine risk screening at age 53.   This service was provided via virtual visit.  Both audio and visual were used.   The patient was located at home.  I was located in my office.  The patient did consent to this virtual visit and is aware of possible charges through their insurance for this visit.  The patient is a new patient.  My certified medical assistant, Grace Bushy, contributed to this visit by contacting the patient by phone 1 or 2 business days prior to the appointment and also followed up on the recommendations I made after the visit.  Time spent on virtual visit: 22 minutes   HPI: This is a very pleasant 53 year old woman whom I last saw little over 8 years ago at the time of a colonoscopy for some lower abdominal pains.  See those results summarized above  Back in April 2020 she had severe abdominal pains for 3-4 days, RLQ.  Labs were normal.  Eventual she had a CT scan.   IMPRESSION CT scan abdomen pelvis with IV and oral contrast 1. Localized mesenteric panniculitis in the midline anterior abdominal wall extending into a minimal periumbilical hernia. This mesenteric panniculitis abuts the anterior aspect of the mid transverse colon without associated bowel wall thickening. No similar mesenteric/panniculitis evident elsewhere.  I reviewed the CT report as well as reviewed the images myself.  Her PCP, Dr. Glori Bickers put her on a course of prednisone for 7-10 days.  The pains improved with time. She had a brief recurrence in May for another 3 days, was not as severe as the first time.  Since then she's been fine.  She has 2-3 BMs a day usually.  No overt bleeding.  Overall stable weight.  She has no abdominal tenderness periumbilically    Chief complaint is panniculitis, otherwise routine risk for  colon cancer  ROS: complete GI ROS as described in HPI, all other review negative.  Constitutional:  No unintentional weight loss   Past Medical History:  Diagnosis Date  . Allergy    allergic rhinitis  . Asthma   . Chronic headaches   . Degenerative disc disease   . Depression   . Fibromyalgia   . Fx of fibula 2009  . GERD (gastroesophageal reflux disease)   . Hemorrhoids   . Hypertension   . IBS (irritable bowel syndrome)   . Iritis, chronic   . Macular hole of right eye   . Sleep apnea     Past Surgical History:  Procedure Laterality Date  . ABDOMINAL HYSTERECTOMY    . ANTERIOR CRUCIATE LIGAMENT REPAIR  1996   small tear of ACL  . BREAST EXCISIONAL BIOPSY Left 1995  . BREAST SURGERY     left nipple removed - benign tumor  . BUNIONECTOMY    . LAPAROSCOPY  08/2004   endometriosis - several   . TONSILLECTOMY      Current Outpatient Medications  Medication Sig Dispense Refill  . acetaminophen (TYLENOL) 500 MG tablet OTC as directed.     . Adalimumab (HUMIRA) 40 MG/0.8ML PSKT Inject 40 mg into the skin once a week.     Marland Kitchen albuterol (PROVENTIL HFA;VENTOLIN HFA) 108 (90 Base) MCG/ACT inhaler Inhale 2 puffs into the lungs every 6 (six) hours as needed for wheezing or shortness of breath. (Patient taking differently: Inhale  2 puffs into the lungs every 6 (six) hours as needed for wheezing or shortness of breath.) 1 Inhaler 12  . albuterol (PROVENTIL) (2.5 MG/3ML) 0.083% nebulizer solution Take 3 mLs (2.5 mg total) by nebulization every 4 (four) hours as needed for wheezing or shortness of breath. 75 mL 1  . budesonide-formoterol (SYMBICORT) 160-4.5 MCG/ACT inhaler Inhale 2 puffs into the lungs 2 (two) times daily. 1 Inhaler 1  . buPROPion (WELLBUTRIN XL) 300 MG 24 hr tablet Take 1 tablet (300 mg total) by mouth daily. 90 tablet 3  . carvedilol (COREG) 6.25 MG tablet Take 1 tablet (6.25 mg total) by mouth 2 (two) times daily with a meal. 180 tablet 3  . Cholecalciferol  (VITAMIN D3 PO) Take 1 capsule by mouth daily.    . [employer] TRIPLE MAGNESIUM COMPLEX PO Take 1 capsule by mouth daily.    . diphenhydrAMINE (BENADRYL) 25 mg capsule Take 25 mg by mouth as needed. OTC as directed.     Marland Kitchen esomeprazole (NEXIUM) 20 MG capsule Take 1 capsule (20 mg total) by mouth daily at 12 noon. 90 capsule 3  . fluticasone (FLONASE) 50 MCG/ACT nasal spray Place 2 sprays into both nostrils daily.    . folic acid (FOLVITE) 1 MG tablet Take 3 mg by mouth 4 (four) times a week.     . hydrochlorothiazide (MICROZIDE) 12.5 MG capsule Take 1 capsule (12.5 mg total) by mouth daily. 30 capsule 11  . ketorolac (ACULAR) 0.4 % SOLN 1 drop 2 (two) times daily.    Marland Kitchen leucovorin (WELLCOVORIN) 5 MG tablet Take 5 mg by mouth 2 (two) times a week.    . methotrexate 50 MG/2ML injection Inject 25 mg into the vein once a week.    . montelukast (SINGULAIR) 10 MG tablet Take 1 tablet (10 mg total) by mouth at bedtime. 90 tablet 3  . Omega 3-6-9 Fatty Acids (OMEGA-3-6-9 PO) Take 1,000 mg by mouth 2 (two) times daily.    . prednisoLONE acetate (PRED FORTE) 1 % ophthalmic suspension Place 4 drops into both eyes daily.      No current facility-administered medications for this visit.     Allergies as of 08/09/2018 - Review Complete 08/09/2018  Allergen Reaction Noted  . Amlodipine  11/15/2017  . Cetirizine hcl  10/03/2007  . Erythromycin  09/29/2006  . Escitalopram oxalate  09/01/2007  . Lisinopril  02/13/2008  . Loratadine  10/03/2007  . Nifedipine  09/01/2007  . Sertraline hcl  09/01/2007  . Vyvanse [lisdexamfetamine dimesylate]  12/17/2010    Family History  Problem Relation Age of Onset  . Hypertension Mother   . Thyroid disease Mother   . Fibromyalgia Mother   . Colon polyps Mother   . Diabetes Father   . Kidney cancer Maternal Grandmother   . Colon cancer Neg Hx     Social History   Socioeconomic History  . Marital status: Divorced    Spouse name: Not on file  . Number of children: 1   . Years of education: Not on file  . Highest education level: Not on file  Occupational History  . Occupation: Disabled due to fibromyalgia    Employer: UNEMPLOYED  Social Needs  . Financial resource strain: Not on file  . Food insecurity    Worry: Not on file    Inability: Not on file  . Transportation needs    Medical: Not on file    Non-medical: Not on file  Tobacco Use  . Smoking status: Never  Smoker  . Smokeless tobacco: Never Used  Substance and Sexual Activity  . Alcohol use: No    Alcohol/week: 0.0 standard drinks  . Drug use: No  . Sexual activity: Never  Lifestyle  . Physical activity    Days per week: Not on file    Minutes per session: Not on file  . Stress: Not on file  Relationships  . Social Herbalist on phone: Not on file    Gets together: Not on file    Attends religious service: Not on file    Active member of club or organization: Not on file    Attends meetings of clubs or organizations: Not on file    Relationship status: Not on file  . Intimate partner violence    Fear of current or ex partner: Not on file    Emotionally abused: Not on file    Physically abused: Not on file    Forced sexual activity: Not on file  Other Topics Concern  . Not on file  Social History Narrative   1 caffeine drink daily      Physical Exam: Unable to perform because this was a "telemed visit" due to current Covid-19 pandemic  Assessment and plan: 53 y.o. female with routine risk for colon cancer, history of mesenteric panniculitis  She had a CT scan after some abdominal pain for 5 months ago.  The radiologist felt that she had some "focal mesenteric panniculitis" at the site of a small periumbilical hernia.  That is a quite rare entity.  She may indeed have had it but I think it is a bit an unusual that this occurred at the site of her umbilical hernia.  Much more, will be intermittently symptomatic and painful periumbilical hernia.  I would suspect  that this could cause some edema that would look similar to what is noted by CT scan.  She had pains again about 1 month later and they resolved after 2 or 3 days.  Since then she has been absolutely pain-free for almost 2 and half months.  I told her is a bit skeptical of the diagnosis of panniculitis.  I would like there is any reason to chase it down right now but if she has recurrent abdominal pains she knows to call here.  My plan will be to repeat CT scan abdomen pelvis and compare to the old.  If there is still concern about that region she might need biopsy.  She is at routine risk for colon cancer I recommend a colonoscopy at her soonest convenience since she turned 82 last year.  Please see the "Patient Instructions" section for addition details about the plan.  Owens Loffler, MD [facility] 08/09/2018, 1:55 PM

## 2018-08-09 NOTE — Patient Instructions (Addendum)
We will arrange for a colonoscopy for routine risk colon cancer screening at her soonest convenience.   She knows to call this office if she has significant abdominal pains again in the future.  Thank you for entrusting me with your care and choosing [facility].  Dr Ardis Hughs

## 2018-09-05 ENCOUNTER — Ambulatory Visit: Payer: Medicare HMO | Admitting: Internal Medicine

## 2018-09-05 ENCOUNTER — Encounter: Payer: Self-pay | Admitting: Gastroenterology

## 2018-09-08 ENCOUNTER — Telehealth: Payer: Self-pay

## 2018-09-08 NOTE — Telephone Encounter (Signed)
Pt returned call and answered NO to all the Covid-19 screening questions °

## 2018-09-08 NOTE — Telephone Encounter (Signed)
Covid-19 screening questions   Do you now or have you had a fever in the last 14 days?  Do you have any respiratory symptoms of shortness of breath or cough now or in the last 14 days?  Do you have any family members or close contacts with diagnosed or suspected Covid-19 in the past 14 days?  Have you been tested for Covid-19 and found to be positive?       

## 2018-09-09 ENCOUNTER — Ambulatory Visit (AMBULATORY_SURGERY_CENTER): Payer: Medicare HMO | Admitting: Gastroenterology

## 2018-09-09 ENCOUNTER — Other Ambulatory Visit: Payer: Self-pay

## 2018-09-09 ENCOUNTER — Encounter: Payer: Self-pay | Admitting: Gastroenterology

## 2018-09-09 VITALS — BP 125/83 | HR 68 | Temp 98.9°F | Resp 16 | Ht 68.0 in | Wt 215.0 lb

## 2018-09-09 DIAGNOSIS — Z1211 Encounter for screening for malignant neoplasm of colon: Secondary | ICD-10-CM

## 2018-09-09 DIAGNOSIS — K219 Gastro-esophageal reflux disease without esophagitis: Secondary | ICD-10-CM | POA: Diagnosis not present

## 2018-09-09 DIAGNOSIS — K654 Sclerosing mesenteritis: Secondary | ICD-10-CM

## 2018-09-09 DIAGNOSIS — I10 Essential (primary) hypertension: Secondary | ICD-10-CM | POA: Diagnosis not present

## 2018-09-09 DIAGNOSIS — G4733 Obstructive sleep apnea (adult) (pediatric): Secondary | ICD-10-CM | POA: Diagnosis not present

## 2018-09-09 DIAGNOSIS — J45909 Unspecified asthma, uncomplicated: Secondary | ICD-10-CM | POA: Diagnosis not present

## 2018-09-09 MED ORDER — SODIUM CHLORIDE 0.9 % IV SOLN: 500.0000 mL | Freq: Once | INTRAVENOUS | Status: DC

## 2018-09-09 NOTE — Progress Notes (Signed)
June Temp Courtney vitals

## 2018-09-09 NOTE — Op Note (Signed)
[facility] Patient Name: [name] Procedure Date: 09/09/2018 8:13 AM MRN: [id number] Endoscopist: Milus Banister , MD Age: 53 Referring MD:  Date of Birth: 02/24/1965 Gender: Female Account #: [id number] Procedure:                Colonoscopy Indications:              Screening for colorectal malignant neoplasm Medicines:                Monitored Anesthesia Care Procedure:                Pre-Anesthesia Assessment:                           - Prior to the procedure, a History and Physical                            was performed, and patient medications and                            allergies were reviewed. The patient's tolerance of                            previous anesthesia was also reviewed. The risks                            and benefits of the procedure and the sedation                            options and risks were discussed with the patient.                            All questions were answered, and informed consent                            was obtained. Prior Anticoagulants: The patient has                            taken no previous anticoagulant or antiplatelet                            agents. ASA Grade Assessment: II - A patient with                            mild systemic disease. After reviewing the risks                            and benefits, the patient was deemed in                            satisfactory condition to undergo the procedure.                           After obtaining informed consent, the colonoscope  was passed under direct vision. Throughout the                            procedure, the patient's blood pressure, pulse, and                            oxygen saturations were monitored continuously. The                            Colonoscope was introduced through the anus and                            advanced to the the cecum, identified by                            appendiceal orifice and  ileocecal valve. The                            colonoscopy was performed without difficulty. The                            patient tolerated the procedure well. The quality                            of the bowel preparation was good. The ileocecal                            valve, appendiceal orifice, and rectum were                            photographed. Scope In: 8:21:31 AM Scope Out: 8:31:57 AM Scope Withdrawal Time: 0 hours 7 minutes 5 seconds  Total Procedure Duration: 0 hours 10 minutes 26 seconds  Findings:                 The entire examined colon appeared normal on direct                            and retroflexion views. Complications:            No immediate complications. Estimated blood loss:                            None. Estimated Blood Loss:     Estimated blood loss: none. Impression:               - The entire examined colon is normal on direct and                            retroflexion views.                           - No polyps or cancers. Recommendation:           - Patient has a contact number available for  emergencies. The signs and symptoms of potential                            delayed complications were discussed with the                            patient. Return to normal activities tomorrow.                            Written discharge instructions were provided to the                            patient.                           - Resume previous diet.                           - Continue present medications.                           - Repeat colonoscopy in 10 years for screening                            purposes. Milus Banister, MD 09/09/2018 8:33:36 AM This report has been signed electronically.

## 2018-09-09 NOTE — Patient Instructions (Signed)
Thank you for allowing [place] to care for you today!  Recommend next screening colonoscopy in 10 years.  Resume previous diet and medications today.  Return to normal activities tomorrow.      YOU HAD AN ENDOSCOPIC PROCEDURE TODAY AT [facility]:   Refer to the procedure report that was given to you for any specific questions about what was found during the examination.  If the procedure report does not answer your questions, please call your gastroenterologist to clarify.  If you requested that your care partner not be given the details of your procedure findings, then the procedure report has been included in a sealed envelope for you to review at your convenience later.  YOU SHOULD EXPECT: Some feelings of bloating in the abdomen. Passage of more gas than usual.  Walking can help get rid of the air that was put into your GI tract during the procedure and reduce the bloating. If you had a lower endoscopy (such as a colonoscopy or flexible sigmoidoscopy) you may notice spotting of blood in your stool or on the toilet paper. If you underwent a bowel prep for your procedure, you may not have a normal bowel movement for a few days.  Please Note:  You might notice some irritation and congestion in your nose or some drainage.  This is from the oxygen used during your procedure.  There is no need for concern and it should clear up in a day or so.  SYMPTOMS TO REPORT IMMEDIATELY:   Following lower endoscopy (colonoscopy or flexible sigmoidoscopy):  Excessive amounts of blood in the stool  Significant tenderness or worsening of abdominal pains  Swelling of the abdomen that is new, acute  Fever of 100F or higher  For urgent or emergent issues, a gastroenterologist can be reached at any hour by calling [phone].   DIET:  We do recommend a small meal at first, but then you may proceed to your regular diet.  Drink plenty of fluids but you should avoid alcoholic beverages for 24  hours.  ACTIVITY:  You should plan to take it easy for the rest of today and you should NOT DRIVE or use heavy machinery until tomorrow (because of the sedation medicines used during the test).    FOLLOW UP: Our staff will call the number listed on your records 48-72 hours following your procedure to check on you and address any questions or concerns that you may have regarding the information given to you following your procedure. If we do not reach you, we will leave a message.  We will attempt to reach you two times.  During this call, we will ask if you have developed any symptoms of COVID 19. If you develop any symptoms (ie: fever, flu-like symptoms, shortness of breath, cough etc.) before then, please call [phone].  If you test positive for Covid 19 in the 2 weeks post procedure, please call and report this information to [place].    If any biopsies were taken you will be contacted by phone or by letter within the next 1-3 weeks.  Please call us at [phone] if you have not heard about the biopsies in 3 weeks.    SIGNATURES/CONFIDENTIALITY: You and/or your care partner have signed paperwork which will be entered into your electronic medical record.  These signatures attest to the fact that that the information above on your After Visit Summary has been reviewed and is understood.  Full responsibility of the confidentiality of this  discharge information lies with you and/or your care-partner. 

## 2018-09-09 NOTE — Progress Notes (Signed)
Report to PACU, RN, vss, BBS= Clear.  

## 2018-09-14 ENCOUNTER — Telehealth: Payer: Self-pay | Admitting: *Deleted

## 2018-09-14 ENCOUNTER — Ambulatory Visit (INDEPENDENT_AMBULATORY_CARE_PROVIDER_SITE_OTHER): Payer: Medicare HMO

## 2018-09-14 ENCOUNTER — Other Ambulatory Visit: Payer: Self-pay

## 2018-09-14 DIAGNOSIS — Z23 Encounter for immunization: Secondary | ICD-10-CM

## 2018-09-14 NOTE — Telephone Encounter (Signed)
  Follow up Call-  Call back number 09/09/2018  Post procedure Call Back phone  # [phone]  Permission to leave phone message Yes  Some recent data might be hidden     Patient questions:  Do you have a fever, pain , or abdominal swelling? No. Pain Score  0 *  Have you tolerated food without any problems? Yes.    Have you been able to return to your normal activities? Yes.    Do you have any questions about your discharge instructions: Diet   No. Medications  No. Follow up visit  No.  Do you have questions or concerns about your Care? No.  Actions: * If pain score is 4 or above: No action needed, pain <4. 1. Have you developed a fever since your procedure? no  2.   Have you had an respiratory symptoms (SOB or cough) since your procedure? no  3.   Have you tested positive for COVID 19 since your procedure no  4.   Have you had any family members/close contacts diagnosed with the COVID 19 since your procedure?  no   If yes to any of these questions please route to Joylene John, RN and Alphonsa Gin, Therapist, sports.

## 2018-10-14 ENCOUNTER — Other Ambulatory Visit: Payer: Self-pay

## 2018-10-14 DIAGNOSIS — Z20828 Contact with and (suspected) exposure to other viral communicable diseases: Secondary | ICD-10-CM | POA: Diagnosis not present

## 2018-10-14 DIAGNOSIS — H35353 Cystoid macular degeneration, bilateral: Secondary | ICD-10-CM | POA: Diagnosis not present

## 2018-10-14 DIAGNOSIS — Z20822 Contact with and (suspected) exposure to covid-19: Secondary | ICD-10-CM

## 2018-10-14 DIAGNOSIS — H35342 Macular cyst, hole, or pseudohole, left eye: Secondary | ICD-10-CM | POA: Diagnosis not present

## 2018-10-14 DIAGNOSIS — H20023 Recurrent acute iridocyclitis, bilateral: Secondary | ICD-10-CM | POA: Diagnosis not present

## 2018-10-16 LAB — NOVEL CORONAVIRUS, NAA: SARS-CoV-2, NAA: NOT DETECTED

## 2018-10-18 ENCOUNTER — Other Ambulatory Visit: Payer: Self-pay | Admitting: *Deleted

## 2018-10-18 DIAGNOSIS — Z20822 Contact with and (suspected) exposure to covid-19: Secondary | ICD-10-CM

## 2018-10-18 DIAGNOSIS — Z20828 Contact with and (suspected) exposure to other viral communicable diseases: Secondary | ICD-10-CM | POA: Diagnosis not present

## 2018-10-20 LAB — NOVEL CORONAVIRUS, NAA: SARS-CoV-2, NAA: NOT DETECTED

## 2018-10-24 ENCOUNTER — Ambulatory Visit (INDEPENDENT_AMBULATORY_CARE_PROVIDER_SITE_OTHER): Payer: Medicare HMO | Admitting: Family Medicine

## 2018-10-24 ENCOUNTER — Encounter: Payer: Self-pay | Admitting: Family Medicine

## 2018-10-24 ENCOUNTER — Other Ambulatory Visit: Payer: Self-pay

## 2018-10-24 VITALS — BP 124/78 | HR 79 | Temp 98.1°F | Ht 68.0 in | Wt 218.8 lb

## 2018-10-24 DIAGNOSIS — S20212A Contusion of left front wall of thorax, initial encounter: Secondary | ICD-10-CM

## 2018-10-24 DIAGNOSIS — S161XXA Strain of muscle, fascia and tendon at neck level, initial encounter: Secondary | ICD-10-CM | POA: Diagnosis not present
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
## 2018-10-24 NOTE — Patient Instructions (Signed)
Good to see you today. You have a neck strain and rib contusion  You can take ibuprofen 800 mg (4 over the counter tablets) every 8 to 12 hours as needed for pain, continue heat, gentle range of motion, deep breaths. Can alternate with Tylenol two tablets every 8 hours as needed.    Rib Contusion A rib contusion is a deep bruise on your rib area. Contusions are the result of a blunt trauma that causes bleeding and injury to the tissues under the skin. A rib contusion may involve bruising of the ribs and of the skin and muscles in the area. The skin over the contusion may turn blue, purple, or yellow. Minor injuries will give you a painless contusion. More severe contusions may stay painful and swollen for a few weeks. What are the causes? This condition is usually caused by a blow, trauma, or direct force to an area of the body. This often occurs while playing contact sports. What are the signs or symptoms? Symptoms of this condition include:  Swelling and redness of the injured area.  Discoloration of the injured area.  Tenderness and soreness of the injured area.  Pain with or without movement. How is this diagnosed? This condition may be diagnosed based on:  Your symptoms and medical history.  A physical exam.  Imaging tests-such as an X-ray, CT scan, or MRI-to determine if there were internal injuries or broken bones (fractures). How is this treated? This condition may be treated with:  Rest. This is often the best treatment for a rib contusion.  Icing. This reduces swelling and inflammation.  Deep-breathing exercises. These may be recommended to reduce the risk for lung collapse and pneumonia.  Medicines. Over-the-counter or prescription medicines may be given to control pain.  Injection of a numbing medicine around the nerve near your injury (nerve block). Follow these instructions at home:     Medicines  Take over-the-counter and prescription medicines only as told  by your health care provider.  Do not drive or use heavy machinery while taking prescription pain medicine.  If you are taking prescription pain medicine, take actions to prevent or treat constipation. Your health care provider may recommend that you: ? Drink enough fluid to keep your urine pale yellow. ? Eat foods that are high in fiber, such as fresh fruits and vegetables, whole grains, and beans. ? Limit foods that are high in fat and processed sugars, such as fried or sweet foods. ? Take an over-the-counter or prescription medicine for constipation. Managing pain, stiffness, and swelling  If directed, put ice on the injured area: ? Put ice in a plastic bag. ? Place a towel between your skin and the bag. ? Leave the ice on for 20 minutes, 2-3 times a day.  Rest the injured area. Avoid strenuous activity and any activities or movements that cause pain. Be careful during activities and avoid bumping the injured area.  Do not lift anything that is heavier than 5 lb (2.3 kg), or the limit that you are told, until your health care provider says that it is safe. General instructions  Do not use any products that contain nicotine or tobacco, such as cigarettes and e-cigarettes. These can delay healing. If you need help quitting, ask your health care provider.  Do deep-breathing exercises as told by your health care provider.  If you were given an incentive spirometer, use it every 1-2 hours while you are awake, or as recommended by your health care provider. This  device measures how well you are filling your lungs with each breath.  Keep all follow-up visits as told by your health care provider. This is important. Contact a health care provider if you have:  Increased bruising or swelling.  Pain that is not controlled with treatment.  A fever. Get help right away if you:  Have difficulty breathing or shortness of breath.  Develop a continual cough or you cough up thick or bloody  sputum.  Feel nauseous or you vomit.  Have pain in your abdomen. Summary  A rib contusion is a deep bruise on your rib area. Contusions are the result of a blunt trauma that causes bleeding and injury to the tissues under the skin.  The skin overlying the contusion may turn blue, purple, or yellow. Minor injuries may give you a painless contusion. More severe contusions may stay painful and swollen for a few weeks.  Rest the injured area. Avoid strenuous activity and any activities or movements that cause pain. This information is not intended to replace advice given to you by your health care provider. Make sure you discuss any questions you have with your health care provider. Document Released: 09/16/2000 Document Revised: 01/20/2017 Document Reviewed: 01/20/2017 Elsevier Patient Education  2020 [employer].

## 2018-11-10 ENCOUNTER — Ambulatory Visit: Payer: Medicare HMO | Admitting: Internal Medicine

## 2018-11-16 ENCOUNTER — Encounter: Payer: Self-pay | Admitting: Family Medicine

## 2018-11-16 ENCOUNTER — Ambulatory Visit (INDEPENDENT_AMBULATORY_CARE_PROVIDER_SITE_OTHER): Payer: Medicare HMO | Admitting: Family Medicine

## 2018-11-16 ENCOUNTER — Other Ambulatory Visit: Payer: Self-pay

## 2018-11-16 ENCOUNTER — Encounter: Payer: Self-pay | Admitting: Allergy & Immunology

## 2018-11-16 ENCOUNTER — Ambulatory Visit: Payer: Medicare HMO | Admitting: Allergy & Immunology

## 2018-11-16 VITALS — BP 156/84 | HR 84 | Temp 98.1°F | Resp 18 | Ht 67.5 in | Wt 221.6 lb

## 2018-11-16 DIAGNOSIS — M549 Dorsalgia, unspecified: Secondary | ICD-10-CM | POA: Diagnosis not present

## 2018-11-16 DIAGNOSIS — J4540 Moderate persistent asthma, uncomplicated: Secondary | ICD-10-CM | POA: Diagnosis not present

## 2018-11-16 DIAGNOSIS — J310 Chronic rhinitis: Secondary | ICD-10-CM | POA: Diagnosis not present

## 2018-11-16 MED ORDER — TRAMADOL HCL 50 MG PO TABS: 50.0000 mg | ORAL_TABLET | Freq: Three times a day (TID) | ORAL | 0 refills | Status: AC | PRN

## 2018-11-16 MED ORDER — CYCLOBENZAPRINE HCL 10 MG PO TABS: 10.0000 mg | ORAL_TABLET | Freq: Three times a day (TID) | ORAL | 0 refills | Status: DC | PRN

## 2018-11-16 NOTE — Patient Instructions (Signed)
Use heat on sore areas  (10 minutes at a time)   Try some stretching and walking   The tramadol may help more severe pain  Flexeril for muscle spasm Use caution -both are sedating   If not improving more in 1-2 weeks let me know

## 2018-11-16 NOTE — Progress Notes (Signed)
FOLLOW UP  Date of Service/Encounter:  11/16/18   Assessment:   Moderate persistent asthma, uncomplicated  Chronic rhinitis - s/p 2 rounds of allergen immunotherapy   Complicated past medical history including fibromyalgia, uveitis, and panniculitis  Immunosuppressed state (methotrexate and Humira)   Asthma Reportables:  Severity: moderate persistent  Risk: high Control: not well controlled   [name] is doing very well today.  Her shortness of breath episodes have all but resolved.  I think we can hold off on adding an antieosinophil medication, but we can certainly keep this in our back pocket if needed in the future.  We are can continue with the Symbicort 2 puffs in the morning and 2 puffs at night.  Her rhinitis is under good control.  We have not done any allergy testing because she is status post 2 rounds of allergen immunotherapy.  Overall, her symptoms are controlled with the Flonase and Zyrtec alone.   Plan/Recommendations:   1. Moderate persistent asthma, uncomplicated - Lung testing looks great today. - We can hold off on the anti-eosinophil medication for now since you have remained so stable.  - Daily controller medication(s): Symbicort 160/4.41mcg two puffs twice daily with spacer - Prior to physical activity: ProAir 2 puffs 10-15 minutes before physical activity. - Rescue medications: ProAir 4 puffs every 4-6 hours as needed or albuterol nebulizer one vial every 4-6 hours as needed - Asthma control goals:  * Full participation in all desired activities (may need albuterol before activity) * Albuterol use two time or less a week on average (not counting use with activity) * Cough interfering with sleep two time or less a month * Oral steroids no more than once a year * No hospitalizations  2. Chronic rhinitis  - Continue with cetirizine 10mg  daily.  - Continue with fluticasone 1-2 sprays per nostril daily.  3. Return in about 6 months (around  05/16/2019). This can be an in-person, a virtual Webex or a telephone follow up visit.   Subjective:   [name] is a 53 y.o. female presenting today for follow up of  Chief Complaint  Patient presents with  . Asthma    Doing well.     ANDERSEN FRY has a history of the following: Patient Active Problem List   Diagnosis Date Noted  . Abdominal pain, right lower quadrant 04/14/2018  . Peripheral edema 10/30/2017  . Moderate persistent asthma, uncomplicated [id number]  . Chronic rhinitis 08/10/2017  . Cough 06/06/2017  . Welcome to Medicare preventive visit 04/20/2017  . Skin lesion 11/02/2016  . Chronic low back pain 12/09/2015  . Degeneration of lumbar or lumbosacral intervertebral disc 12/09/2015  . Elevated TSH 01/24/2014  . Pseudophakia of both eyes 12/15/2013  . Nasal septal deviation 01/03/2013  . Encounter for Medicare annual wellness exam 11/02/2012  . Hydradenitis 11/02/2012  . Cystoid macular degeneration of retina 05/13/2012  . Hyperglycemia 03/23/2012  . Macular hole 03/23/2012  . Screening-pulmonary TB 03/23/2012  . Routine general medical examination at a health care facility 10/02/2011  . Iritis, recurrent 10/02/2011  . Tachycardia 10/02/2011  . ADD (attention deficit disorder) 08/20/2010  . DYSHIDROTIC ECZEMA, HANDS 11/12/2009  . Obstructive sleep apnea 11/19/2006  . DEPRESSION 09/29/2006  . Essential hypertension 09/29/2006  . Seasonal and perennial allergic rhinitis 09/29/2006  . Asthma, mild intermittent, well-controlled 09/29/2006  . GERD 09/29/2006  . IBS 09/29/2006  . ENDOMETRIOSIS 09/29/2006  . FEMALE INFERTILITY 09/29/2006  . Fibromyalgia 09/29/2006  . INSOMNIA 09/29/2006  History obtained from: chart review and patient.  [name] is a 53 y.o. female presenting for a follow up visit.  She was last seen in May 2020.  At that time, she was still having episodes of waking up at night with shortness of breath.  We continued the Symbicort 2  puffs twice daily.  We did give her information on mepolizumab to address her elevated eosinophils.   For he rhinitis, we continued with cetirizine and Flonase.  Since last visit, she has done well.    Asthma/Respiratory Symptom History: She reports that her SOB episodes have decreased. Fall and winter are the worst times of the year for her. She remains on the Symbicort two puffs twice daily for her asthma. She is better on compared to off of it. She has not reuqired the use of prednisone at all. She has not needed her albuterol at all.   Allergic Rhinitis Symptom History: She continues to use her Flonase Sensimist, which she uses on a daily basis. She is also on her cetirizine. She is s/p allergen immunotherapy with Dr. Annamaria Boots and then Dr. Shaune Leeks before that.   She has iritis as well as fibromyalgia. This is why she is on disability. She is on MTX as well as Humira. She has been on this combination for 2-3 years. This is helping her iritis symptoms quite a bit. She continues to follow with Dr. Tomi Likens. She does see a Merchant navy officer at [employer] and is changing providers (Dr. Felipa Eth).   She has had some COVID19 exposures but she has tested negative. She had some exposures at her daughter's dance class.   Otherwise, there have been no changes to her past medical history, surgical history, family history, or social history.    Review of Systems  Constitutional: Negative.  Negative for chills, fever, malaise/fatigue and weight loss.  HENT: Negative.  Negative for congestion, ear discharge and ear pain.   Eyes: Negative for pain, discharge and redness.  Respiratory: Negative for cough, sputum production, shortness of breath and wheezing.   Cardiovascular: Negative.  Negative for chest pain and palpitations.  Gastrointestinal: Negative for abdominal pain, constipation, diarrhea, heartburn, nausea and vomiting.  Skin: Negative.  Negative for itching and rash.  Neurological: Negative for  dizziness and headaches.  Endo/Heme/Allergies: Negative for environmental allergies. Does not bruise/bleed easily.       Objective:   Blood pressure (!) 156/84, pulse 84, temperature 98.1 F (36.7 C), temperature source Temporal, resp. rate 18, height 5' 7.5" (1.715 m), weight 221 lb 9.6 oz (100.5 kg), last menstrual period 08/05/2006, SpO2 96 %. Body mass index is 34.2 kg/m.   Physical Exam:  Physical Exam  Constitutional: She appears well-developed.  Pleasant female. Talkative.   HENT:  Head: Normocephalic and atraumatic.  Right Ear: Tympanic membrane, external ear and ear canal normal. No drainage, swelling or tenderness. Tympanic membrane is not injected, not scarred, not erythematous, not retracted and not bulging.  Left Ear: Tympanic membrane, external ear and ear canal normal. No drainage, swelling or tenderness. Tympanic membrane is not injected, not scarred, not erythematous, not retracted and not bulging.  Nose: Mucosal edema and rhinorrhea present. No nasal deformity or septal deviation. No epistaxis. Right sinus exhibits no maxillary sinus tenderness and no frontal sinus tenderness. Left sinus exhibits no maxillary sinus tenderness and no frontal sinus tenderness.  Mouth/Throat: Uvula is midline and oropharynx is clear and moist. Mucous membranes are not pale and not dry.  Cobblestoning present in the posterior oropharynx.  Eyes: Pupils are equal, round, and reactive to light. Conjunctivae and EOM are normal. Right eye exhibits no chemosis and no discharge. Left eye exhibits no chemosis and no discharge. Right conjunctiva is not injected. Left conjunctiva is not injected.  Cardiovascular: Normal rate, regular rhythm and normal heart sounds.  Respiratory: Effort normal and breath sounds normal. No accessory muscle usage. No tachypnea. No respiratory distress. She has no wheezes. She has no rhonchi. She has no rales. She exhibits no tenderness.  Moving air well in all lung  fields.  GI: There is no abdominal tenderness. There is no rebound and no guarding.  Lymphadenopathy:       Head (right side): No submandibular, no tonsillar and no occipital adenopathy present.       Head (left side): No submandibular, no tonsillar and no occipital adenopathy present.    She has no cervical adenopathy.  Neurological: She is alert.  Skin: No abrasion, no petechiae and no rash noted. Rash is not papular, not vesicular and not urticarial. No erythema. No pallor.  No skin findings whatsoever.  Psychiatric: She has a normal mood and affect.     Diagnostic studies:   Spirometry: results normal (FEV1: 2.58/91%, FVC: 2.89/77%, FEV1/FVC: 89%).    Spirometry consistent with normal pattern.   Allergy Studies: none       Salvatore Marvel, MD  [facility]

## 2018-11-16 NOTE — Assessment & Plan Note (Signed)
Acute on chronic w/o neuro symptoms in pt recently in [facility] Suspect muscular  Reassuring exam  Px for flexeril given for spasm and tramadol for pain prn with warning of sedation/habit Given rehab back stretches to do  Will consider PT if she does not continue to improve

## 2018-11-16 NOTE — Assessment & Plan Note (Signed)
Reassuring exam with R chest wall contusion and seemingly muscular back pain  Offered PT if needed for soreness/stiffness Disc stretches for back  Px tramadol and flexeril to have on hand Will watch for s/s of concussion
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
## 2018-11-16 NOTE — Patient Instructions (Addendum)
1. Moderate persistent asthma, uncomplicated - Lung testing looks great today. - We can hold off on the anti-eosinophil medication for now since you have remained so stable.  - Daily controller medication(s): Symbicort 160/4.85mcg two puffs twice daily with spacer - Prior to physical activity: ProAir 2 puffs 10-15 minutes before physical activity. - Rescue medications: ProAir 4 puffs every 4-6 hours as needed or albuterol nebulizer one vial every 4-6 hours as needed - Asthma control goals:  * Full participation in all desired activities (may need albuterol before activity) * Albuterol use two time or less a week on average (not counting use with activity) * Cough interfering with sleep two time or less a month * Oral steroids no more than once a year * No hospitalizations  2. Chronic rhinitis  - Continue with cetirizine 10mg  daily.  - Continue with fluticasone 1-2 sprays per nostril daily.  3. Return in about 6 months (around 05/16/2019). This can be an in-person, a virtual Webex or a telephone follow up visit.   Please inform us of any Emergency Department visits, hospitalizations, or changes in symptoms. Call us before going to the ED for breathing or allergy symptoms since we might be able to fit you in for a sick visit. Feel free to contact us anytime with any questions, problems, or concerns.  It was a pleasure to see you again today!  Websites that have reliable patient information: 1. American Academy of Asthma, Allergy, and Immunology: www.aaaai.org 2. Food Allergy Research and Education (FARE): foodallergy.org 3. Mothers of Asthmatics: http://www.asthmacommunitynetwork.org 4. American College of Allergy, Asthma, and Immunology: www.acaai.org  "Like" [place] on Facebook and Instagram for our latest updates!      Make sure you are registered to vote! If you have moved or changed any of your contact information, you will need to get this updated before voting!    Voter ID laws  are NOT going into effect for the General Election in November 2020! DO NOT let this stop you from exercising your right to vote!

## 2018-12-28 ENCOUNTER — Encounter: Payer: Self-pay | Admitting: Internal Medicine

## 2019-01-02 ENCOUNTER — Ambulatory Visit (INDEPENDENT_AMBULATORY_CARE_PROVIDER_SITE_OTHER): Payer: Medicare HMO | Admitting: Internal Medicine

## 2019-01-02 ENCOUNTER — Other Ambulatory Visit: Payer: Self-pay

## 2019-01-02 DIAGNOSIS — J4520 Mild intermittent asthma, uncomplicated: Secondary | ICD-10-CM | POA: Diagnosis not present

## 2019-01-02 DIAGNOSIS — G4733 Obstructive sleep apnea (adult) (pediatric): Secondary | ICD-10-CM

## 2019-01-02 NOTE — Patient Instructions (Signed)
We can continue CPAP auto 5-15, mask of choice, humidifier, supplies, AirView/ card  I hope your eye issues get much better this year.  Please call if we can help
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
## 2019-01-22 ENCOUNTER — Encounter: Payer: Self-pay | Admitting: Internal Medicine

## 2019-01-22 NOTE — Assessment & Plan Note (Signed)
Benefits with good compliance and control Plan- continue auto 5-15 

## 2019-01-22 NOTE — Assessment & Plan Note (Signed)
Continues care by her allergist now

## 2019-02-09 ENCOUNTER — Other Ambulatory Visit: Payer: Self-pay | Admitting: Family Medicine

## 2019-02-13 ENCOUNTER — Ambulatory Visit: Payer: Medicare HMO | Attending: Internal Medicine

## 2019-02-13 ENCOUNTER — Other Ambulatory Visit: Payer: Self-pay

## 2019-02-13 DIAGNOSIS — Z20822 Contact with and (suspected) exposure to covid-19: Secondary | ICD-10-CM

## 2019-02-14 LAB — NOVEL CORONAVIRUS, NAA: SARS-CoV-2, NAA: NOT DETECTED

## 2019-02-17 DIAGNOSIS — H35353 Cystoid macular degeneration, bilateral: Secondary | ICD-10-CM | POA: Diagnosis not present

## 2019-02-17 DIAGNOSIS — H20023 Recurrent acute iridocyclitis, bilateral: Secondary | ICD-10-CM | POA: Diagnosis not present

## 2019-02-17 DIAGNOSIS — Z79899 Other long term (current) drug therapy: Secondary | ICD-10-CM | POA: Diagnosis not present

## 2019-02-17 DIAGNOSIS — H20029 Recurrent acute iridocyclitis, unspecified eye: Secondary | ICD-10-CM | POA: Diagnosis not present

## 2019-02-17 DIAGNOSIS — E559 Vitamin D deficiency, unspecified: Secondary | ICD-10-CM | POA: Diagnosis not present

## 2019-03-24 DIAGNOSIS — G4733 Obstructive sleep apnea (adult) (pediatric): Secondary | ICD-10-CM | POA: Diagnosis not present

## 2019-04-10 DIAGNOSIS — D2261 Melanocytic nevi of right upper limb, including shoulder: Secondary | ICD-10-CM | POA: Diagnosis not present

## 2019-04-10 DIAGNOSIS — D2239 Melanocytic nevi of other parts of face: Secondary | ICD-10-CM | POA: Diagnosis not present

## 2019-04-10 DIAGNOSIS — L820 Inflamed seborrheic keratosis: Secondary | ICD-10-CM | POA: Diagnosis not present

## 2019-04-10 DIAGNOSIS — L821 Other seborrheic keratosis: Secondary | ICD-10-CM | POA: Diagnosis not present

## 2019-04-10 DIAGNOSIS — L720 Epidermal cyst: Secondary | ICD-10-CM | POA: Diagnosis not present

## 2019-04-10 DIAGNOSIS — L853 Xerosis cutis: Secondary | ICD-10-CM | POA: Diagnosis not present

## 2019-04-10 DIAGNOSIS — D2271 Melanocytic nevi of right lower limb, including hip: Secondary | ICD-10-CM | POA: Diagnosis not present

## 2019-04-10 DIAGNOSIS — L858 Other specified epidermal thickening: Secondary | ICD-10-CM | POA: Diagnosis not present

## 2019-04-10 DIAGNOSIS — D225 Melanocytic nevi of trunk: Secondary | ICD-10-CM | POA: Diagnosis not present

## 2019-04-14 ENCOUNTER — Other Ambulatory Visit: Payer: Self-pay | Admitting: Family Medicine

## 2019-04-14 DIAGNOSIS — Z1231 Encounter for screening mammogram for malignant neoplasm of breast: Secondary | ICD-10-CM

## 2019-04-16 IMAGING — DX DG CHEST 2V
2 series · 2 of 2 positions shown · non-contrast
Comparison: 07/21/2016.

CLINICAL DATA: Follow-up pneumonia.

EXAM:
CHEST  2 VIEW

[chest pa]
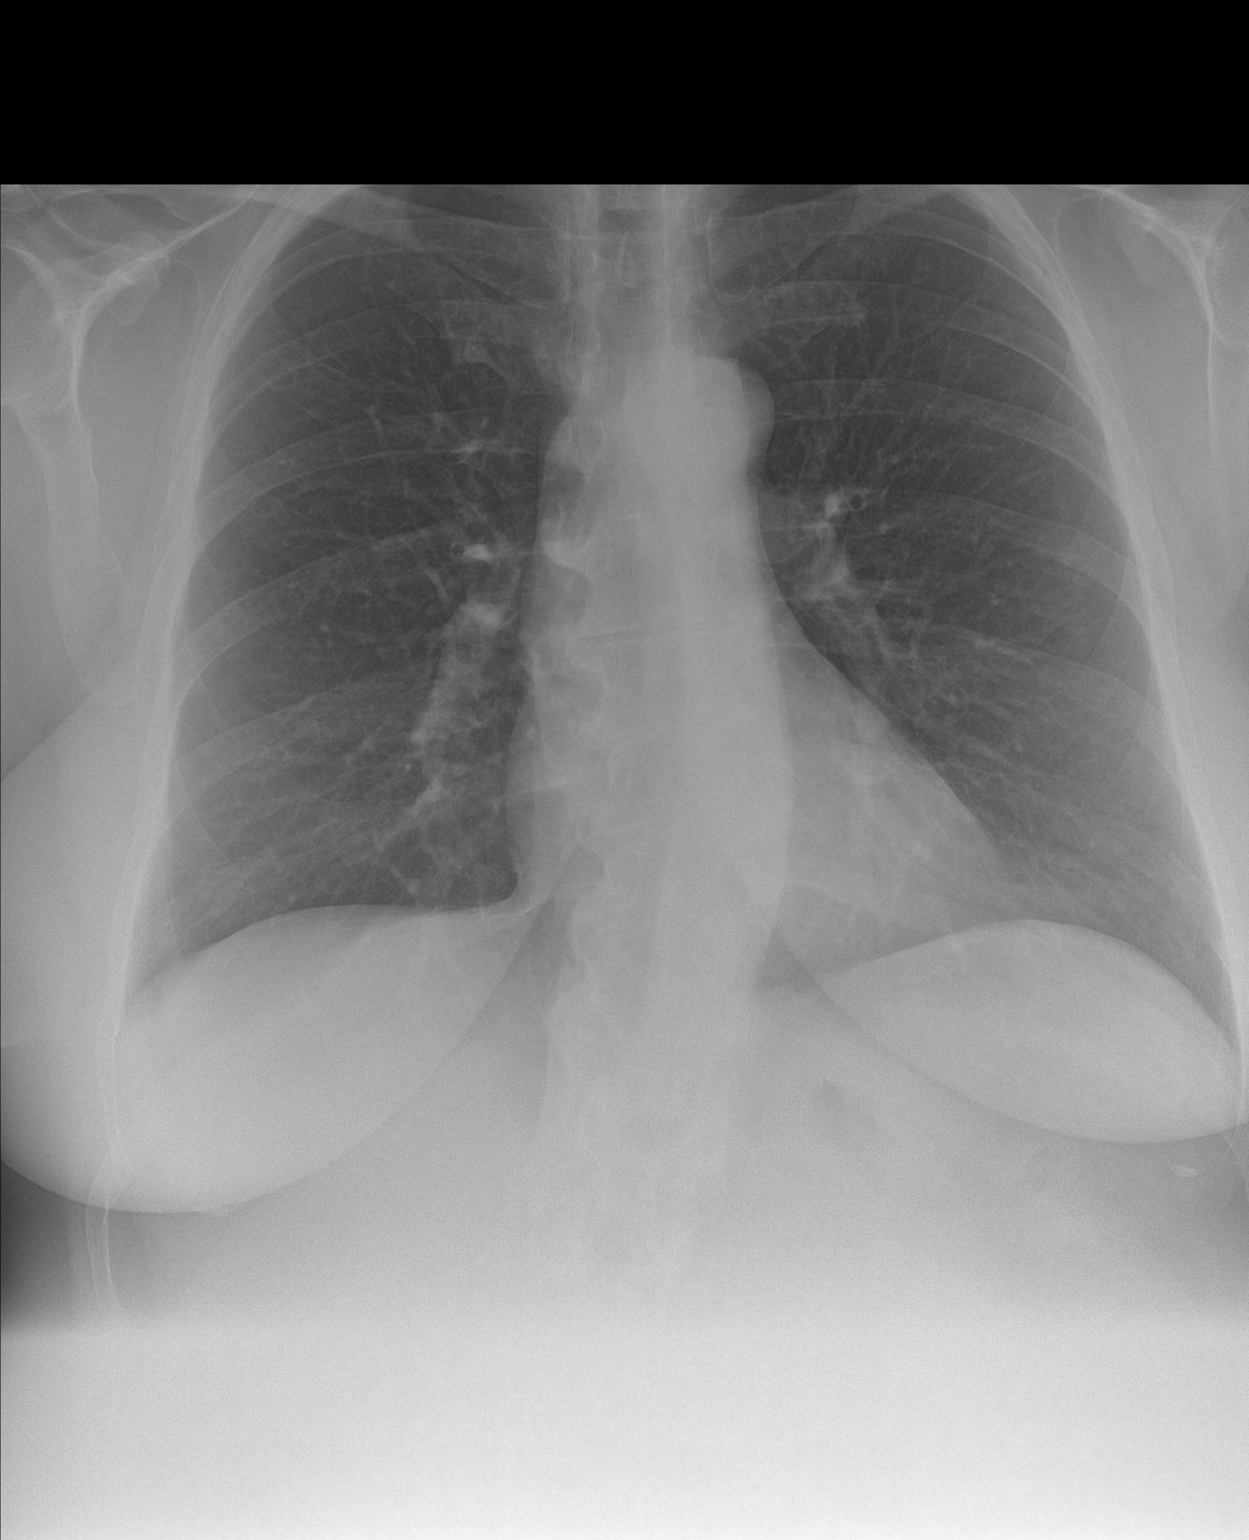

[chest lat]
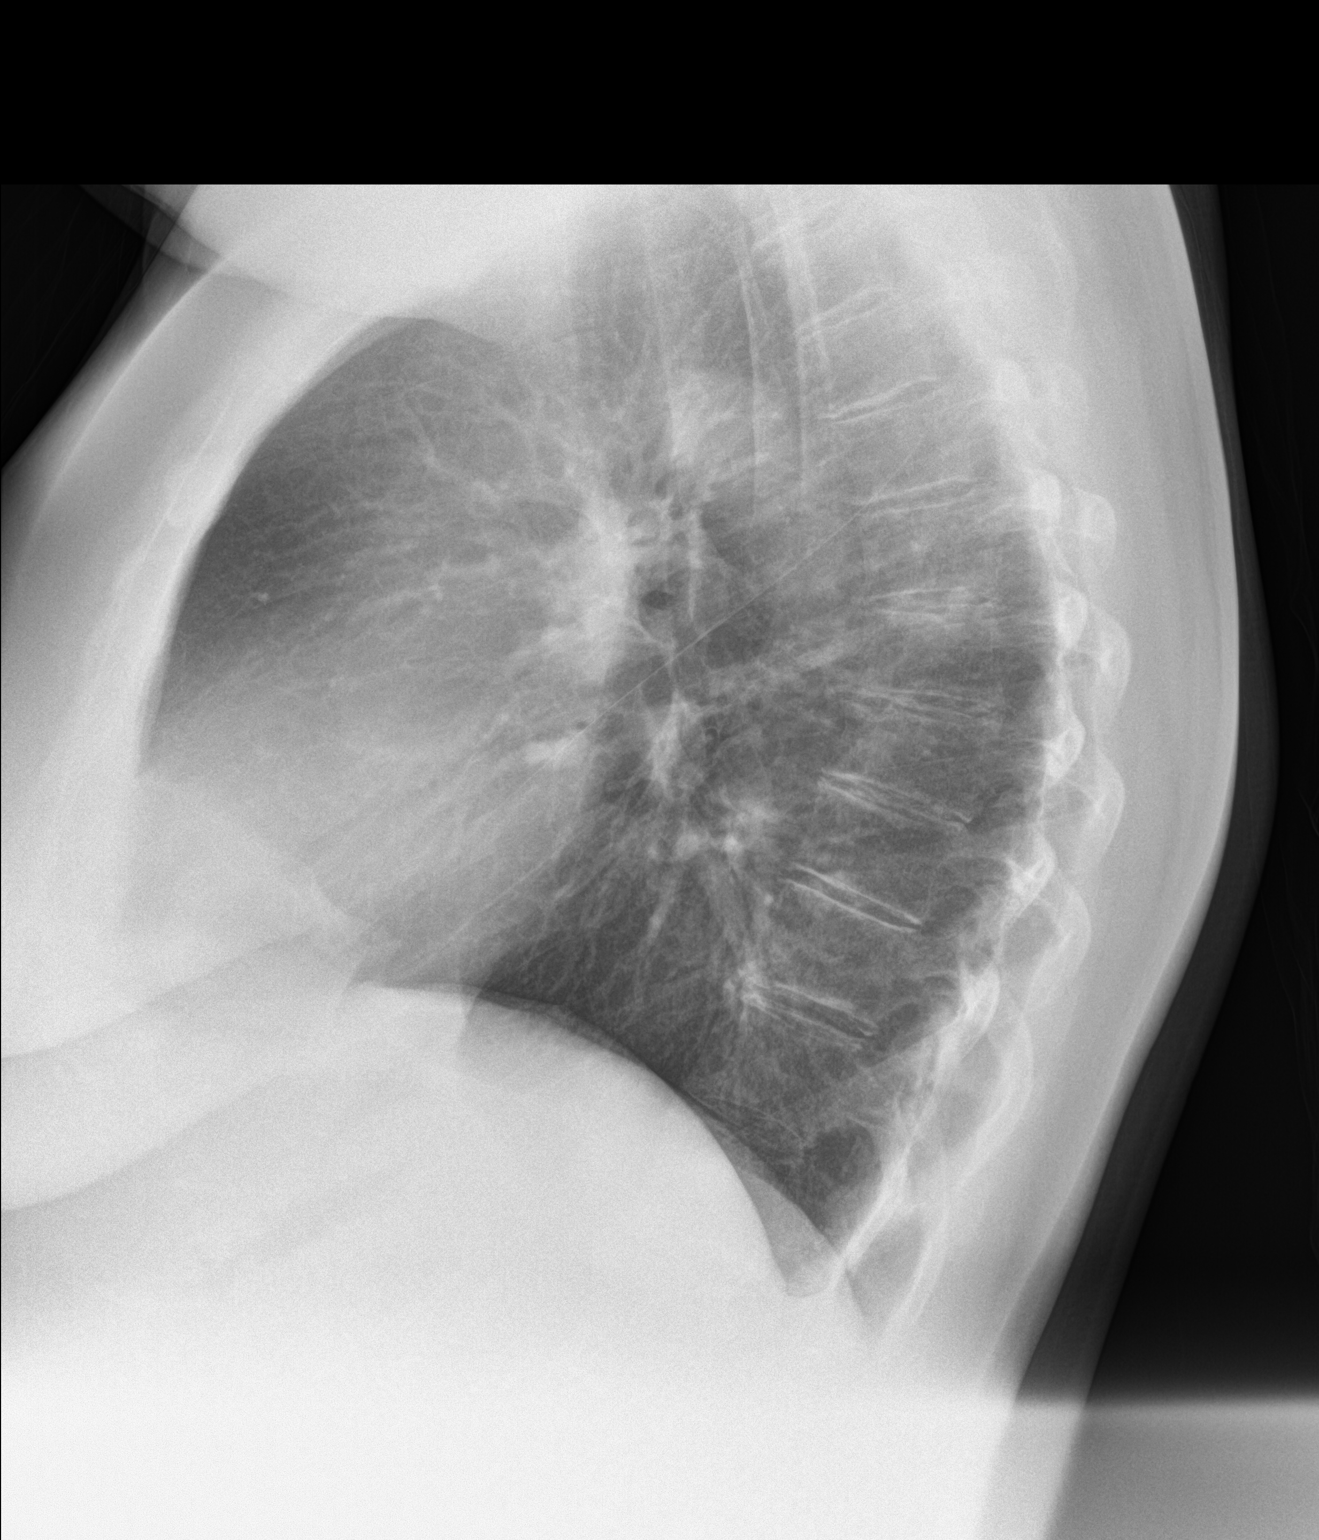

[2 of 2 positions shown; findings below may reference images not displayed]

FINDINGS: Mediastinum and hilar structures normal. Interval partial clearing
of lingular atelectasis/infiltrate. No pleural effusion or
pneumothorax. Degenerative changes thoracic spine .
IMPRESSION: Interval partial clearing of lingular atelectasis/infiltrate.

## 2019-04-24 DIAGNOSIS — G4733 Obstructive sleep apnea (adult) (pediatric): Secondary | ICD-10-CM | POA: Diagnosis not present

## 2019-05-01 ENCOUNTER — Encounter: Payer: Self-pay | Admitting: Family Medicine

## 2019-05-01 DIAGNOSIS — Z20828 Contact with and (suspected) exposure to other viral communicable diseases: Secondary | ICD-10-CM | POA: Diagnosis not present

## 2019-05-01 DIAGNOSIS — Z03818 Encounter for observation for suspected exposure to other biological agents ruled out: Secondary | ICD-10-CM | POA: Diagnosis not present

## 2019-05-01 NOTE — Telephone Encounter (Signed)
Please see what pharmacy she wants to send to it  Thanks

## 2019-05-02 MED ORDER — FLUCONAZOLE 150 MG PO TABS: 150.0000 mg | ORAL_TABLET | Freq: Once | ORAL | 0 refills | Status: AC

## 2019-05-17 ENCOUNTER — Ambulatory Visit: Payer: Medicare HMO | Admitting: Allergy & Immunology

## 2019-05-17 ENCOUNTER — Encounter: Payer: Self-pay | Admitting: Family Medicine

## 2019-05-17 ENCOUNTER — Telehealth (INDEPENDENT_AMBULATORY_CARE_PROVIDER_SITE_OTHER): Payer: Medicare HMO | Admitting: Family Medicine

## 2019-05-17 DIAGNOSIS — B373 Candidiasis of vulva and vagina: Secondary | ICD-10-CM

## 2019-05-17 DIAGNOSIS — B370 Candidal stomatitis: Secondary | ICD-10-CM | POA: Diagnosis not present

## 2019-05-17 DIAGNOSIS — Z20822 Contact with and (suspected) exposure to covid-19: Secondary | ICD-10-CM | POA: Diagnosis not present

## 2019-05-17 DIAGNOSIS — B3731 Acute candidiasis of vulva and vagina: Secondary | ICD-10-CM | POA: Insufficient documentation

## 2019-05-17 MED ORDER — FLUCONAZOLE 150 MG PO TABS: ORAL_TABLET | ORAL | 0 refills | Status: DC

## 2019-05-17 MED ORDER — NYSTATIN 100000 UNIT/ML MT SUSP: 5.0000 mL | Freq: Three times a day (TID) | OROMUCOSAL | 0 refills | Status: DC

## 2019-05-17 NOTE — Assessment & Plan Note (Signed)
Pt was exposed /developed symptoms and tested negative late in April  Now feeling better (more than 14 days out)  Thinks she had covid despite neg test  I enc her to get tested again so she knows (re: timing of vaccination would change)

## 2019-05-17 NOTE — Patient Instructions (Addendum)
For thrush -use the nystatin mouthwash as directed (swish and swallow)  Update if not starting to improve in a week or if worsening    For vaginal symptoms take another diflucan 150 mg now orally and repeat dose in 3 days  Follow up for an exam if worse or not improved   Consider getting tested for covid again if you think you may have had it  If you did-the wait 90 days for the covid immunization
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
## 2019-05-17 NOTE — Assessment & Plan Note (Signed)
Partially resolved with one diflucan 150 mg dose  Will repeat this (still some irritation) 150mg  now and again in 3 days If not improved-will f/u for examination  Urged to keep area dry and watch for discharge or other symptoms  She is pre disposed to yeast due to biologic meds for auto immune disorder

## 2019-05-17 NOTE — Assessment & Plan Note (Signed)
Pt is prone to this due to immuno modifying meds -Humira and methotrexate Px nystatin oral solution 5 ml swish and swallow tid until cleared  Update if not starting to improve in a week or if worsening   Also getting diflucan for recent vaginal yeast infection

## 2019-05-24 DIAGNOSIS — G4733 Obstructive sleep apnea (adult) (pediatric): Secondary | ICD-10-CM | POA: Diagnosis not present

## 2019-06-01 ENCOUNTER — Emergency Department (HOSPITAL_COMMUNITY)
Admission: EM | Admit: 2019-06-01 | Discharge: 2019-06-01 | Disposition: A | Discharge status: Home / Self Care | Payer: Medicare HMO | Attending: Emergency Medicine | Admitting: Emergency Medicine

## 2019-06-01 ENCOUNTER — Telehealth: Payer: Self-pay | Admitting: Radiology

## 2019-06-01 ENCOUNTER — Encounter (HOSPITAL_COMMUNITY): Payer: Self-pay | Admitting: *Deleted

## 2019-06-01 ENCOUNTER — Other Ambulatory Visit: Payer: Self-pay

## 2019-06-01 ENCOUNTER — Encounter: Payer: Self-pay | Admitting: Family Medicine

## 2019-06-01 ENCOUNTER — Ambulatory Visit (INDEPENDENT_AMBULATORY_CARE_PROVIDER_SITE_OTHER): Payer: Medicare HMO | Admitting: Family Medicine

## 2019-06-01 VITALS — BP 144/92 | HR 82 | Temp 96.9°F | Ht 67.5 in | Wt 205.0 lb

## 2019-06-01 DIAGNOSIS — R7303 Prediabetes: Secondary | ICD-10-CM | POA: Diagnosis not present

## 2019-06-01 DIAGNOSIS — B373 Candidiasis of vulva and vagina: Secondary | ICD-10-CM | POA: Diagnosis not present

## 2019-06-01 DIAGNOSIS — R638 Other symptoms and signs concerning food and fluid intake: Secondary | ICD-10-CM

## 2019-06-01 DIAGNOSIS — Z79899 Other long term (current) drug therapy: Secondary | ICD-10-CM | POA: Diagnosis not present

## 2019-06-01 DIAGNOSIS — IMO0001 Reserved for inherently not codable concepts without codable children: Secondary | ICD-10-CM | POA: Insufficient documentation

## 2019-06-01 DIAGNOSIS — J45909 Unspecified asthma, uncomplicated: Secondary | ICD-10-CM | POA: Insufficient documentation

## 2019-06-01 DIAGNOSIS — E1165 Type 2 diabetes mellitus with hyperglycemia: Secondary | ICD-10-CM | POA: Diagnosis present

## 2019-06-01 DIAGNOSIS — Z7984 Long term (current) use of oral hypoglycemic drugs: Secondary | ICD-10-CM | POA: Insufficient documentation

## 2019-06-01 DIAGNOSIS — R739 Hyperglycemia, unspecified: Secondary | ICD-10-CM

## 2019-06-01 DIAGNOSIS — N898 Other specified noninflammatory disorders of vagina: Secondary | ICD-10-CM | POA: Diagnosis not present

## 2019-06-01 DIAGNOSIS — I10 Essential (primary) hypertension: Secondary | ICD-10-CM | POA: Insufficient documentation

## 2019-06-01 DIAGNOSIS — B3731 Acute candidiasis of vulva and vagina: Secondary | ICD-10-CM

## 2019-06-01 LAB — BASIC METABOLIC PANEL
BUN: 14 mg/dL (ref 6–23)
CO2: 27 mEq/L (ref 19–32)
Calcium: 9 mg/dL (ref 8.4–10.5)
Chloride: 94 mEq/L — ABNORMAL LOW (ref 96–112)
Creatinine, Ser: 0.93 mg/dL (ref 0.40–1.20)
GFR: 63.32 mL/min (ref >60.00)
Glucose, Bld: 501 mg/dL (ref 70–99)
Potassium: 4.1 mEq/L (ref 3.5–5.1)
Sodium: 128 mEq/L — ABNORMAL LOW (ref 135–145)

## 2019-06-01 LAB — CBC WITH DIFFERENTIAL/PLATELET
Abs Immature Granulocytes: 0.02 10*3/uL (ref 0.00–0.07)
Basophils Absolute: 0.1 10*3/uL (ref 0.0–0.1)
Basophils Relative: 1 %
Eosinophils Absolute: 0.4 10*3/uL (ref 0.0–0.5)
Eosinophils Relative: 4 %
HCT: 48.2 % — ABNORMAL HIGH (ref 36.0–46.0)
Hemoglobin: 15.7 g/dL — ABNORMAL HIGH (ref 12.0–15.0)
Immature Granulocytes: 0 %
Lymphocytes Relative: 55 %
Lymphs Abs: 5.4 10*3/uL — ABNORMAL HIGH (ref 0.7–4.0)
MCH: 29.7 pg (ref 26.0–34.0)
MCHC: 32.6 g/dL (ref 30.0–36.0)
MCV: 91.1 fL (ref 80.0–100.0)
Monocytes Absolute: 0.7 10*3/uL (ref 0.1–1.0)
Monocytes Relative: 7 %
Neutro Abs: 3.2 10*3/uL (ref 1.7–7.7)
Neutrophils Relative %: 33 %
Platelets: 211 10*3/uL (ref 150–400)
RBC: 5.29 MIL/uL — ABNORMAL HIGH (ref 3.87–5.11)
RDW: 13.6 % (ref 11.5–15.5)
WBC: 9.8 10*3/uL (ref 4.0–10.5)
nRBC: 0 % (ref 0.0–0.2)

## 2019-06-01 LAB — COMPREHENSIVE METABOLIC PANEL
ALT: 26 U/L (ref 0–44)
AST: 63 U/L — ABNORMAL HIGH (ref 15–41)
Albumin: 3.9 g/dL (ref 3.5–5.0)
Alkaline Phosphatase: 140 U/L — ABNORMAL HIGH (ref 38–126)
Anion gap: 11 (ref 5–15)
BUN: 16 mg/dL (ref 6–20)
CO2: 27 mmol/L (ref 22–32)
Calcium: 9.3 mg/dL (ref 8.9–10.3)
Chloride: 97 mmol/L — ABNORMAL LOW (ref 98–111)
Creatinine, Ser: 0.89 mg/dL (ref 0.44–1.00)
GFR calc Af Amer: >60 mL/min (ref >60)
GFR calc non Af Amer: >60 mL/min (ref >60)
Glucose, Bld: 343 mg/dL — ABNORMAL HIGH (ref 70–99)
Potassium: 3.6 mmol/L (ref 3.5–5.1)
Sodium: 135 mmol/L (ref 135–145)
Total Bilirubin: 0.6 mg/dL (ref 0.3–1.2)
Total Protein: 7.7 g/dL (ref 6.5–8.1)

## 2019-06-01 LAB — POCT WET PREP (WET MOUNT): Trichomonas Wet Prep HPF POC: ABSENT

## 2019-06-01 LAB — CBG MONITORING, ED
Glucose-Capillary: 428 mg/dL — ABNORMAL HIGH (ref 70–99)
Glucose-Capillary: 273 mg/dL — ABNORMAL HIGH (ref 70–99)

## 2019-06-01 LAB — HEMOGLOBIN A1C: Hgb A1c MFr Bld: 12.1 % — ABNORMAL HIGH (ref 4.6–6.5)

## 2019-06-01 MED ORDER — SODIUM CHLORIDE 0.9 % IV BOLUS
1000.0000 mL | Freq: Once | INTRAVENOUS | Status: AC
  Administered 2019-06-01: 1000 mL via INTRAVENOUS

## 2019-06-01 MED ORDER — METFORMIN HCL 500 MG PO TABS
ORAL_TABLET | ORAL | 0 refills | Status: DC
Start: 2019-06-01 — End: 2019-06-06

## 2019-06-01 MED ORDER — METRONIDAZOLE 0.75 % VA GEL: 1.0000 | Freq: Every day | VAGINAL | 0 refills | Status: DC

## 2019-06-01 MED ORDER — FLUCONAZOLE 150 MG PO TABS: ORAL_TABLET | ORAL | 0 refills | Status: DC

## 2019-06-01 NOTE — Assessment & Plan Note (Signed)
Excessive thirst (? If also urination)  Prediabetes in the past  Check A1C and bmet today  Disc low glycemic diet

## 2019-06-01 NOTE — Discharge Instructions (Signed)
Follow up with your md next week. °
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
## 2019-06-01 NOTE — Telephone Encounter (Signed)
Please call her and direct her to the ER/diall 911.  Thanks.   Routed to PCP as FYI.

## 2019-06-01 NOTE — Patient Instructions (Addendum)
Eat yogurt and/or take probiotics   Take diflucan as directed for yeast Use the metro gel vaginal once daily for a week   Let me know if no improvement after that   Let's get an a1C on you as well for the thirst   Labs today for chemistry and blood sugar   Get away from sugar drinks and sweets  Try to get most of your carbohydrates from produce (with the exception of white potatoes)  Eat less bread/pasta/rice/snack foods/cereals/sweets and other items from the middle of the grocery store (processed carbs)
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
## 2019-06-01 NOTE — Telephone Encounter (Signed)
I thank all involved. 

## 2019-06-01 NOTE — Assessment & Plan Note (Signed)
Both yeast and clue cells seen on wet prep tx with diflucan times 3 and also metrogel vaginal  Update if not starting to improve in a week or if worsening

## 2019-06-01 NOTE — Telephone Encounter (Signed)
This is new for her - I checked labs because she c/o of thirst and chronic yeast- thanks for the ED advisement

## 2019-06-01 NOTE — Assessment & Plan Note (Signed)
Recurrent  Some hyphae on wet prep but also clue cells tx with diflucan (does not tolerate topical yeast preps)  Will also tx for BV  Checking A1C today along with bmet in light of frequent yeast and excessive thirst

## 2019-06-01 NOTE — Assessment & Plan Note (Signed)
Recent increase in thirst  Also recurrent yeast  Suspect possibly diabetic  Checking A1C and bmet today

## 2019-06-01 NOTE — Telephone Encounter (Signed)
[facility] called a critical Glucose - 501, results given to Dr Damita Dunnings

## 2019-06-01 NOTE — Telephone Encounter (Signed)
Pt notified as instructed and voiced understanding. Pt will go to [facility] ED now.pt declined 911 and said there is someone with her that will take her to ED now and pt can be there in about 34' . Pt said she is not having any symptoms other than a yeast infection that will not go away. Pt is not on any diabetic meds and is leaving now for [facility]. I spoke with Marjorie Smolder at [facility] ED and let her know above info; Angelina voiced understanding. FYI to Dr Glori Bickers as PCP and Dr Damita Dunnings who the report of BS was given.

## 2019-06-01 NOTE — ED Triage Notes (Signed)
States she was advised by her doctor to come in for evaluation of blood sugar

## 2019-06-02 ENCOUNTER — Ambulatory Visit: Payer: Medicare HMO | Admitting: Family Medicine

## 2019-06-06 ENCOUNTER — Encounter: Payer: Self-pay | Admitting: Family Medicine

## 2019-06-06 ENCOUNTER — Other Ambulatory Visit: Payer: Self-pay

## 2019-06-06 ENCOUNTER — Ambulatory Visit (INDEPENDENT_AMBULATORY_CARE_PROVIDER_SITE_OTHER): Payer: Medicare HMO | Admitting: Family Medicine

## 2019-06-06 VITALS — BP 150/92 | HR 83 | Temp 96.9°F | Ht 68.0 in | Wt 203.4 lb

## 2019-06-06 DIAGNOSIS — I10 Essential (primary) hypertension: Secondary | ICD-10-CM

## 2019-06-06 DIAGNOSIS — E119 Type 2 diabetes mellitus without complications: Secondary | ICD-10-CM | POA: Insufficient documentation

## 2019-06-06 DIAGNOSIS — E1165 Type 2 diabetes mellitus with hyperglycemia: Secondary | ICD-10-CM

## 2019-06-06 MED ORDER — METFORMIN HCL 500 MG PO TABS: 500.0000 mg | ORAL_TABLET | Freq: Two times a day (BID) | ORAL | 3 refills | Status: DC
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
## 2019-06-06 NOTE — Assessment & Plan Note (Signed)
New dm 2 diagnosis  Ace caused cough in the past  Will likely try arb in the future

## 2019-06-06 NOTE — Patient Instructions (Addendum)
Try to get most of your carbohydrates from produce (with the exception of white potatoes)  Eat less bread/pasta/rice/snack foods/cereals/sweets and other items from the middle of the grocery store (processed carbs)  Gradually work up to 30 minutes of exercise daily   I placed a referral for diabetic teaching -the office will call you to set that up   I would like you to start checking blood glucose in am (fasting) , and then 2 hours after meal later in the day  Keep a journal   Goal - fasting glucose 120s and 2 hours after meal 140s or below  If below 70 - hold medicine and let us know   Follow up with me in a month

## 2019-06-06 NOTE — Assessment & Plan Note (Signed)
New diagnosis Disc implications of uncontrolled DM2 Reviewed hospital records, lab results and studies in detail  (from ER visit)  Lab Results  Component Value Date   HGBA1C 12.1 (H) 06/01/2019   Tolerating metformin-will inc to 500 bid (likely to 1000 mg later)  Thirst had improved Eating low glycemic diet Px for DM test equip -to check bid  F/u in 1 mo with readings Work up to 30 minutes of low impact exercise daily  Disc foot/eye care and imms  Ref done for DM teaching  Enc wt loss Will need to start a statin at next visit

## 2019-06-07 ENCOUNTER — Encounter: Payer: Self-pay | Admitting: Family Medicine

## 2019-06-07 DIAGNOSIS — E1165 Type 2 diabetes mellitus with hyperglycemia: Secondary | ICD-10-CM

## 2019-06-09 NOTE — Telephone Encounter (Signed)
Order faxed to [facility]

## 2019-06-09 NOTE — Telephone Encounter (Signed)
Printed in IN box Please send to her pharmacy

## 2019-06-10 DIAGNOSIS — R69 Illness, unspecified: Secondary | ICD-10-CM | POA: Diagnosis not present

## 2019-06-14 ENCOUNTER — Ambulatory Visit: Payer: Medicare HMO | Admitting: Allergy & Immunology

## 2019-06-16 DIAGNOSIS — E559 Vitamin D deficiency, unspecified: Secondary | ICD-10-CM | POA: Insufficient documentation

## 2019-06-16 DIAGNOSIS — Z961 Presence of intraocular lens: Secondary | ICD-10-CM | POA: Diagnosis not present

## 2019-06-16 DIAGNOSIS — H3023 Posterior cyclitis, bilateral: Secondary | ICD-10-CM | POA: Diagnosis not present

## 2019-06-16 DIAGNOSIS — Z79899 Other long term (current) drug therapy: Secondary | ICD-10-CM | POA: Diagnosis not present

## 2019-06-16 DIAGNOSIS — H20023 Recurrent acute iridocyclitis, bilateral: Secondary | ICD-10-CM | POA: Diagnosis not present

## 2019-06-16 DIAGNOSIS — H35342 Macular cyst, hole, or pseudohole, left eye: Secondary | ICD-10-CM | POA: Diagnosis not present

## 2019-06-16 DIAGNOSIS — H35353 Cystoid macular degeneration, bilateral: Secondary | ICD-10-CM | POA: Diagnosis not present

## 2019-06-16 HISTORY — DX: Vitamin D deficiency, unspecified: E55.9

## 2019-06-22 DIAGNOSIS — G4733 Obstructive sleep apnea (adult) (pediatric): Secondary | ICD-10-CM | POA: Diagnosis not present

## 2019-06-23 DIAGNOSIS — R69 Illness, unspecified: Secondary | ICD-10-CM | POA: Diagnosis not present

## 2019-06-25 ENCOUNTER — Other Ambulatory Visit: Payer: Self-pay | Admitting: Family Medicine

## 2019-07-07 ENCOUNTER — Encounter: Payer: Self-pay | Admitting: Family Medicine

## 2019-07-07 ENCOUNTER — Other Ambulatory Visit: Payer: Self-pay

## 2019-07-07 ENCOUNTER — Ambulatory Visit (INDEPENDENT_AMBULATORY_CARE_PROVIDER_SITE_OTHER): Payer: Medicare HMO | Admitting: Family Medicine

## 2019-07-07 VITALS — BP 138/88 | HR 76 | Temp 97.6°F | Ht 68.0 in | Wt 206.0 lb

## 2019-07-07 DIAGNOSIS — E785 Hyperlipidemia, unspecified: Secondary | ICD-10-CM | POA: Diagnosis not present

## 2019-07-07 DIAGNOSIS — E1169 Type 2 diabetes mellitus with other specified complication: Secondary | ICD-10-CM | POA: Diagnosis not present

## 2019-07-07 DIAGNOSIS — I10 Essential (primary) hypertension: Secondary | ICD-10-CM

## 2019-07-07 DIAGNOSIS — E1165 Type 2 diabetes mellitus with hyperglycemia: Secondary | ICD-10-CM

## 2019-07-07 MED ORDER — ROSUVASTATIN CALCIUM 5 MG PO TABS
5.0000 mg | ORAL_TABLET | ORAL | 3 refills | Status: DC
Start: 2019-07-07 — End: 2020-07-10
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
## 2019-07-07 NOTE — Patient Instructions (Addendum)
Take generic crestor every other day  If side effects- stop it and call  No other changes  You are doing well  Keep working on diet and exercise   Keep up the good work

## 2019-07-10 DIAGNOSIS — E1169 Type 2 diabetes mellitus with other specified complication: Secondary | ICD-10-CM | POA: Insufficient documentation

## 2019-07-10 NOTE — Assessment & Plan Note (Signed)
LDL 92 with goal of 70 or below Disc goals for lipids and reasons to control them Rev last labs with pt Rev low sat fat diet in detail  Will start crestor 5 mg every other day Rev poss side eff-enc to call if problems  Lab and f/u planned

## 2019-07-10 NOTE — Assessment & Plan Note (Signed)
Lab Results  Component Value Date   HGBA1C 12.1 (H) 06/01/2019   Glucose levels since then avg in 140s- overall doing much better with diabetes/diet and metformin  Will start crestor today  Not on ace-caused cough Will disc arb next time  Enc to keep up the good work with diet /exercise

## 2019-07-10 NOTE — Assessment & Plan Note (Signed)
bp in fair control at this time  BP Readings from Last 1 Encounters:  07/07/19 138/88   No changes needed Most recent labs reviewed  Disc lifstyle change with low sodium diet and exercise

## 2019-07-22 DIAGNOSIS — G4733 Obstructive sleep apnea (adult) (pediatric): Secondary | ICD-10-CM | POA: Diagnosis not present

## 2019-08-08 ENCOUNTER — Encounter: Payer: Self-pay | Admitting: Family Medicine

## 2019-08-22 ENCOUNTER — Other Ambulatory Visit: Payer: Self-pay

## 2019-08-22 ENCOUNTER — Encounter: Payer: Medicare HMO | Attending: Family Medicine | Admitting: Nutrition

## 2019-08-22 VITALS — Ht 68.0 in | Wt 213.0 lb

## 2019-08-22 DIAGNOSIS — E669 Obesity, unspecified: Secondary | ICD-10-CM

## 2019-08-22 DIAGNOSIS — E1165 Type 2 diabetes mellitus with hyperglycemia: Secondary | ICD-10-CM | POA: Diagnosis not present

## 2019-08-22 DIAGNOSIS — R69 Illness, unspecified: Secondary | ICD-10-CM | POA: Diagnosis not present

## 2019-08-22 DIAGNOSIS — G4733 Obstructive sleep apnea (adult) (pediatric): Secondary | ICD-10-CM | POA: Diagnosis not present

## 2019-08-22 DIAGNOSIS — E1169 Type 2 diabetes mellitus with other specified complication: Secondary | ICD-10-CM

## 2019-08-22 DIAGNOSIS — E785 Hyperlipidemia, unspecified: Secondary | ICD-10-CM | POA: Insufficient documentation

## 2019-08-22 NOTE — Progress Notes (Signed)
Medical Nutrition Therapy:  Appt start time: 1610 end time:  1630.   Assessment:  Primary concerns today: Diabetes Type 2. LIves with her daughter and her parents live with her. Eats out mostly. Dx DM for less than 6 months. PMH: Sleep Apnea, Asthma, GERD, IBS, Hyperlipidemia, Dr. Glori Bickers with [facility].  Metformin 500 mg BID. a1c 12%. She just Recoverd from [place] end of April/early May and she thinks that might have impacted her BS's. Had her BS tested when diagnosed with covid and they sent her to the ER and got A1C after that. She had PreDM prior to diagnosis of DM. Testing twice a day. FBS this am 154 mg/dl  BS are better as she has felt better, taking medications. Would benefit from seeing an Endocrinologist.  Elevated liver enyzmes CMP Latest Ref Rng & Units 06/01/2019 06/01/2019 04/14/2018  Glucose 70 - 99 mg/dL 343(H) 501(HH) 120(H)  BUN 6 - 20 mg/dL 16 14 10   Creatinine 0.44 - 1.00 mg/dL 0.89 0.93 0.92  Sodium 135 - 145 mmol/L 135 128(L) 137  Potassium 3.5 - 5.1 mmol/L 3.6 4.1 4.2  Chloride 98 - 111 mmol/L 97(L) 94(L) 103  CO2 22 - 32 mmol/L 27 27 26   Calcium 8.9 - 10.3 mg/dL 9.3 9.0 9.2  Total Protein 6.5 - 8.1 g/dL 7.7 - 7.0  Total Bilirubin 0.3 - 1.2 mg/dL 0.6 - 1.1  Alkaline Phos 38 - 126 U/L 140(H) - 90  AST 15 - 41 U/L 63(H) - 27  ALT 0 - 44 U/L 26 - 17   Lab Results  Component Value Date   HGBA1C 12.1 (H) 06/01/2019   Lipid Panel     Component Value Date/Time   CHOL 166 04/15/2017 0745   TRIG 77.0 04/15/2017 0745   HDL 59.10 04/15/2017 0745   CHOLHDL 3 04/15/2017 0745   VLDL 15.4 04/15/2017 0745   LDLCALC 92 04/15/2017 0745    Preferred Learning Style:    No preference indicated   Learning Readiness:    Ready  Change in progress   MEDICATIONS:   DIETARY INTAKE:   24-hr recall:  B ( AM):Slim fast  Snk ( AM):   L ( PM): Smart one frozen meal, Dt. Pepper zero Snk ( PM): cashews or cereal bar D ( PM): Arby chicken sandwich, fries,  Dt. Soda. Snk ( PM):  Beverages:  Usual physical activity: ADL   Estimated energy needs: 1200  calories 130 g carbohydrates 90 g protein 33 g fat  Progress Towards Goal(s):  In progress.   Nutritional Diagnosis:  NB-1.1 Food and nutrition-related knowledge deficit As related to Diabetes Type 2.  As evidenced by A1C 12%.    Intervention:  Nutrition and Diabetes education provided on My Plate, CHO counting, meal planning, portion sizes, timing of meals, avoiding snacks between meals unless having a low blood sugar, target ranges for A1C and blood sugars, signs/symptoms and treatment of hyper/hypoglycemia, monitoring blood sugars, taking medications as prescribed, benefits of exercising 30 minutes per day and prevention of complications of DM. Goals  Follow My Plate Eat 2-3 carb choice per meal Increase fresh fruits and vegetables  Cut out diet sodas Drink only water Eat breakfast at home daily.   Teaching Method Utilized:  Visual Auditory Hands on  Handouts given during visit include:  The Plate Method   Meal Plan Card  Diabetes Instructions  Wt Loss tips     Barriers to learning/adherence to lifestyle change: none  Demonstrated degree of understanding via:  Teach Back   Monitoring/Evaluation:  Dietary intake, exercise, , and body weight in 1 month(s).

## 2019-08-22 NOTE — Patient Instructions (Signed)
Goals  Follow My Plate Eat 2-3 carb choice per meal Increase fresh fruits and vegetables  Cut out diet sodas Drink only water Eat breakfast at home daily.

## 2019-08-24 ENCOUNTER — Encounter: Payer: Self-pay | Admitting: Nutrition

## 2019-09-07 ENCOUNTER — Encounter: Payer: Self-pay | Admitting: Family Medicine

## 2019-09-11 ENCOUNTER — Other Ambulatory Visit: Payer: Self-pay | Admitting: Family Medicine

## 2019-09-20 DIAGNOSIS — G4733 Obstructive sleep apnea (adult) (pediatric): Secondary | ICD-10-CM | POA: Diagnosis not present

## 2019-09-22 ENCOUNTER — Ambulatory Visit: Payer: Medicare HMO | Admitting: Allergy & Immunology

## 2019-09-22 ENCOUNTER — Other Ambulatory Visit: Payer: Self-pay

## 2019-09-22 ENCOUNTER — Encounter: Payer: Self-pay | Admitting: Allergy & Immunology

## 2019-09-22 VITALS — BP 132/84 | HR 86 | Resp 18 | Ht 68.0 in | Wt 207.6 lb

## 2019-09-22 DIAGNOSIS — J310 Chronic rhinitis: Secondary | ICD-10-CM | POA: Diagnosis not present

## 2019-09-22 DIAGNOSIS — R69 Illness, unspecified: Secondary | ICD-10-CM | POA: Diagnosis not present

## 2019-09-22 DIAGNOSIS — J4540 Moderate persistent asthma, uncomplicated: Secondary | ICD-10-CM | POA: Diagnosis not present

## 2019-09-22 MED ORDER — ALBUTEROL SULFATE HFA 108 (90 BASE) MCG/ACT IN AERS: 2.0000 | INHALATION_SPRAY | Freq: Four times a day (QID) | RESPIRATORY_TRACT | 1 refills | Status: DC | PRN

## 2019-09-22 MED ORDER — ALBUTEROL SULFATE (2.5 MG/3ML) 0.083% IN NEBU: 2.5000 mg | INHALATION_SOLUTION | RESPIRATORY_TRACT | 1 refills | Status: DC | PRN

## 2019-09-22 MED ORDER — BUDESONIDE-FORMOTEROL FUMARATE 160-4.5 MCG/ACT IN AERO
2.0000 | INHALATION_SPRAY | Freq: Two times a day (BID) | RESPIRATORY_TRACT | 6 refills | Status: DC
Start: 2019-09-22 — End: 2020-08-23

## 2019-09-22 NOTE — Patient Instructions (Addendum)
1. Moderate persistent asthma, uncomplicated - Lung testing looks great today. - We will not make any medication changes at this time.  - Daily controller medication(s): NONE - Prior to physical activity: ProAir 2 puffs 10-15 minutes before physical activity. - Rescue medications: ProAir 4 puffs every 4-6 hours as needed or albuterol nebulizer one vial every 4-6 hours as needed  - During period of respiratory distress: start Symbicort 160/4.47mcg two puffs twice daily with spacer for 1-2 weeks - Asthma control goals:  * Full participation in all desired activities (may need albuterol before activity) * Albuterol use two time or less a week on average (not counting use with activity) * Cough interfering with sleep two time or less a month * Oral steroids no more than once a year * No hospitalizations  2. Chronic rhinitis  - Continue with levocetirizine 5mg  daily as needed - Continue with fluticasone 1-2 sprays per nostril daily as needed.   3. Return in about 6 months (around 03/21/2020).    Please inform us of any Emergency Department visits, hospitalizations, or changes in symptoms. Call us before going to the ED for breathing or allergy symptoms since we might be able to fit you in for a sick visit. Feel free to contact us anytime with any questions, problems, or concerns.  It was a pleasure to see you again today!  Websites that have reliable patient information: 1. American Academy of Asthma, Allergy, and Immunology: www.aaaai.org 2. Food Allergy Research and Education (FARE): foodallergy.org 3. Mothers of Asthmatics: http://www.asthmacommunitynetwork.org 4. American College of Allergy, Asthma, and Immunology: www.acaai.org   COVID-19 Vaccine Information can be found at: ShippingScam.co.uk For questions related to vaccine distribution or appointments, please email [email or web address] or call [phone].     "Like"  [place] on Facebook and Instagram for our latest updates!        Make sure you are registered to vote! If you have moved or changed any of your contact information, you will need to get this updated before voting!  In some cases, you MAY be able to register to vote online: [email or web address]
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
## 2019-09-25 ENCOUNTER — Encounter: Payer: Self-pay | Admitting: Allergy & Immunology

## 2019-09-28 ENCOUNTER — Encounter: Payer: Medicare HMO | Attending: Family Medicine | Admitting: Nutrition

## 2019-09-28 ENCOUNTER — Encounter: Payer: Self-pay | Admitting: Nutrition

## 2019-09-28 ENCOUNTER — Other Ambulatory Visit: Payer: Self-pay

## 2019-09-28 VITALS — Ht 68.0 in | Wt 205.0 lb

## 2019-09-28 DIAGNOSIS — E669 Obesity, unspecified: Secondary | ICD-10-CM | POA: Diagnosis not present

## 2019-09-28 DIAGNOSIS — E785 Hyperlipidemia, unspecified: Secondary | ICD-10-CM | POA: Diagnosis not present

## 2019-09-28 DIAGNOSIS — E1169 Type 2 diabetes mellitus with other specified complication: Secondary | ICD-10-CM

## 2019-09-28 DIAGNOSIS — E1165 Type 2 diabetes mellitus with hyperglycemia: Secondary | ICD-10-CM | POA: Diagnosis not present

## 2019-09-28 NOTE — Progress Notes (Signed)
  Medical Nutrition Therapy:  Appt start time:1400 0 end time:  1430   Assessment:  Primary concerns today: Diabetes Type 2. LIves with her daughter and her parents live with her.   Has cut back on diet sodas and  Drinking a lot more water. Still working on timing of meals. Making better choices when she eats out. Goes to PCP in October for lab work. Lost 7 lbs. Still struggles to eat meals on time. FBS 120-130's . Bedtime usually 150's or less. Still taking Metformin 500 mg BID.    Elevated liver enyzmes CMP Latest Ref Rng & Units 06/01/2019 06/01/2019 04/14/2018  Glucose 70 - 99 mg/dL 343(H) 501(HH) 120(H)  BUN 6 - 20 mg/dL 16 14 10   Creatinine 0.44 - 1.00 mg/dL 0.89 0.93 0.92  Sodium 135 - 145 mmol/L 135 128(L) 137  Potassium 3.5 - 5.1 mmol/L 3.6 4.1 4.2  Chloride 98 - 111 mmol/L 97(L) 94(L) 103  CO2 22 - 32 mmol/L 27 27 26   Calcium 8.9 - 10.3 mg/dL 9.3 9.0 9.2  Total Protein 6.5 - 8.1 g/dL 7.7 - 7.0  Total Bilirubin 0.3 - 1.2 mg/dL 0.6 - 1.1  Alkaline Phos 38 - 126 U/L 140(H) - 90  AST 15 - 41 U/L 63(H) - 27  ALT 0 - 44 U/L 26 - 17   Lab Results  Component Value Date   HGBA1C 12.1 (H) 06/01/2019   Lipid Panel     Component Value Date/Time   CHOL 166 04/15/2017 0745   TRIG 77.0 04/15/2017 0745   HDL 59.10 04/15/2017 0745   CHOLHDL 3 04/15/2017 0745   VLDL 15.4 04/15/2017 0745   LDLCALC 92 04/15/2017 0745    Preferred Learning Style:    No preference indicated   Learning Readiness:    Ready  Change in progress   MEDICATIONS:   DIETARY INTAKE:   24-hr recall:  B ( AM):Slim fast  Snk ( AM):   L ( PM): Smart one frozen meal, Dt. Pepper zero Snk ( PM): cashews or cereal bar D ( PM): Arby chicken sandwich, fries, Dt. Soda. Snk ( PM):  Beverages:  Usual physical activity: ADL   Estimated energy needs: 1200  calories 130 g carbohydrates 90 g protein 33 g fat  Progress Towards Goal(s):  In progress.   Nutritional Diagnosis:  NB-1.1 Food and  nutrition-related knowledge deficit As related to Diabetes Type 2.  As evidenced by A1C 12%.    Intervention:  Nutrition and Diabetes education provided on My Plate, CHO counting, meal planning, portion sizes, timing of meals, avoiding snacks between meals unless having a low blood sugar, target ranges for A1C and blood sugars, signs/symptoms and treatment of hyper/hypoglycemia, monitoring blood sugars, taking medications as prescribed, benefits of exercising 30 minutes per day and prevention of complications of DM.   Goals  Continue to tweak meals to continue to improve blood sugars. Continue to work on cutting out diet sodas. Get A1C below 7%.    Teaching Method Utilized:  Visual Auditory Hands on  Handouts given during visit include:  The Plate Method   Meal Plan Card  Diabetes Instructions  Wt Loss tips     Barriers to learning/adherence to lifestyle change: none  Demonstrated degree of understanding via:  Teach Back   Monitoring/Evaluation:  Dietary intake, exercise, , and body weight in 3 month(s).

## 2019-09-28 NOTE — Patient Instructions (Signed)
Goals  Continue to tweak meals to continue to improve blood sugars. Continue to work on cutting out diet sodas. Get A1C below 7%.

## 2019-09-29 DIAGNOSIS — R69 Illness, unspecified: Secondary | ICD-10-CM | POA: Diagnosis not present

## 2019-10-01 ENCOUNTER — Telehealth: Payer: Self-pay | Admitting: Family Medicine

## 2019-10-01 DIAGNOSIS — E1165 Type 2 diabetes mellitus with hyperglycemia: Secondary | ICD-10-CM

## 2019-10-01 DIAGNOSIS — E785 Hyperlipidemia, unspecified: Secondary | ICD-10-CM

## 2019-10-01 DIAGNOSIS — E1169 Type 2 diabetes mellitus with other specified complication: Secondary | ICD-10-CM

## 2019-10-01 DIAGNOSIS — I10 Essential (primary) hypertension: Secondary | ICD-10-CM

## 2019-10-01 NOTE — Telephone Encounter (Signed)
-----   Message from Ellamae Sia sent at 09/19/2019 12:41 PM EDT ----- Regarding: Lab orders for Monday. 9.27.21 Lab orders for a 3 month follow up appt.

## 2019-10-02 ENCOUNTER — Other Ambulatory Visit: Payer: Self-pay

## 2019-10-02 ENCOUNTER — Other Ambulatory Visit (INDEPENDENT_AMBULATORY_CARE_PROVIDER_SITE_OTHER): Payer: Medicare HMO

## 2019-10-02 DIAGNOSIS — I10 Essential (primary) hypertension: Secondary | ICD-10-CM

## 2019-10-02 DIAGNOSIS — E1165 Type 2 diabetes mellitus with hyperglycemia: Secondary | ICD-10-CM

## 2019-10-02 DIAGNOSIS — E1169 Type 2 diabetes mellitus with other specified complication: Secondary | ICD-10-CM | POA: Diagnosis not present

## 2019-10-02 DIAGNOSIS — E785 Hyperlipidemia, unspecified: Secondary | ICD-10-CM

## 2019-10-02 LAB — MICROALBUMIN / CREATININE URINE RATIO
Creatinine,U: 14 mg/dL
Microalb Creat Ratio: 5 mg/g (ref 0.0–30.0)
Microalb, Ur: <0.7 mg/dL (ref 0.0–1.9)

## 2019-10-02 LAB — COMPREHENSIVE METABOLIC PANEL
ALT: 22 U/L (ref 0–35)
AST: 36 U/L (ref 0–37)
Albumin: 4.2 g/dL (ref 3.5–5.2)
Alkaline Phosphatase: 71 U/L (ref 39–117)
BUN: 15 mg/dL (ref 6–23)
CO2: 25 mEq/L (ref 19–32)
Calcium: 9.4 mg/dL (ref 8.4–10.5)
Chloride: 102 mEq/L (ref 96–112)
Creatinine, Ser: 0.81 mg/dL (ref 0.40–1.20)
GFR: 74.17 mL/min (ref >60.00)
Glucose, Bld: 107 mg/dL — ABNORMAL HIGH (ref 70–99)
Potassium: 4 mEq/L (ref 3.5–5.1)
Sodium: 135 mEq/L (ref 135–145)
Total Bilirubin: 0.6 mg/dL (ref 0.2–1.2)
Total Protein: 7.4 g/dL (ref 6.0–8.3)

## 2019-10-02 LAB — LIPID PANEL
Cholesterol: 151 mg/dL (ref 0–200)
HDL: 47 mg/dL (ref >39.00)
LDL Cholesterol: 79 mg/dL (ref 0–99)
NonHDL: 104.11
Total CHOL/HDL Ratio: 3
Triglycerides: 125 mg/dL (ref 0.0–149.0)
VLDL: 25 mg/dL (ref 0.0–40.0)

## 2019-10-02 LAB — HEMOGLOBIN A1C: Hgb A1c MFr Bld: 7.7 % — ABNORMAL HIGH (ref 4.6–6.5)

## 2019-10-09 ENCOUNTER — Encounter: Payer: Self-pay | Admitting: Family Medicine

## 2019-10-09 ENCOUNTER — Ambulatory Visit (INDEPENDENT_AMBULATORY_CARE_PROVIDER_SITE_OTHER): Payer: Medicare HMO | Admitting: Family Medicine

## 2019-10-09 ENCOUNTER — Other Ambulatory Visit: Payer: Self-pay

## 2019-10-09 VITALS — BP 138/80 | HR 101 | Temp 97.4°F | Ht 68.0 in | Wt 204.5 lb

## 2019-10-09 DIAGNOSIS — H6983 Other specified disorders of Eustachian tube, bilateral: Secondary | ICD-10-CM | POA: Diagnosis not present

## 2019-10-09 DIAGNOSIS — I10 Essential (primary) hypertension: Secondary | ICD-10-CM | POA: Diagnosis not present

## 2019-10-09 DIAGNOSIS — H6980 Other specified disorders of Eustachian tube, unspecified ear: Secondary | ICD-10-CM | POA: Insufficient documentation

## 2019-10-09 DIAGNOSIS — Z23 Encounter for immunization: Secondary | ICD-10-CM

## 2019-10-09 DIAGNOSIS — E785 Hyperlipidemia, unspecified: Secondary | ICD-10-CM | POA: Diagnosis not present

## 2019-10-09 DIAGNOSIS — E1165 Type 2 diabetes mellitus with hyperglycemia: Secondary | ICD-10-CM | POA: Diagnosis not present

## 2019-10-09 DIAGNOSIS — E1169 Type 2 diabetes mellitus with other specified complication: Secondary | ICD-10-CM | POA: Diagnosis not present

## 2019-10-09 MED ORDER — METFORMIN HCL 1000 MG PO TABS: 1000.0000 mg | ORAL_TABLET | Freq: Two times a day (BID) | ORAL | 3 refills | Status: DC

## 2019-10-09 NOTE — Assessment & Plan Note (Signed)
Much improved  Lab Results  Component Value Date   HGBA1C 7.7 (H) 10/02/2019   down from 12.1  Rev glucose monitor results- much improved Diet /exercise are improved  Tolerating metformin 500 bid and wants to inc to 1000  Will call if side eff Taking statin  No ace due to cough with it in the past Nl mircalbutmin

## 2019-10-09 NOTE — Assessment & Plan Note (Signed)
Encouraged her to get back to daily use of steroid nasal spray  Update if not starting to improve in a week or if worsening

## 2019-10-09 NOTE — Assessment & Plan Note (Signed)
bp in fair control at this time  BP Readings from Last 1 Encounters:  10/09/19 138/80   No changes needed Most recent labs reviewed  Disc lifstyle change with low sodium diet and exercise

## 2019-10-09 NOTE — Patient Instructions (Signed)
Increase metformin to 1000 mg twice daily  If any side effects let us know   Keep up the great work with diet  Add more exercise when you can   Follow up in 3 months
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
## 2019-10-09 NOTE — Assessment & Plan Note (Signed)
Disc goals for lipids and reasons to control them Rev last labs with pt Rev low sat fat diet in detail LDL closer to goal at 79 Will continue crestor and work on diet

## 2019-10-19 ENCOUNTER — Other Ambulatory Visit: Payer: Medicare HMO

## 2019-10-19 DIAGNOSIS — Z20822 Contact with and (suspected) exposure to covid-19: Secondary | ICD-10-CM

## 2019-10-20 DIAGNOSIS — G4733 Obstructive sleep apnea (adult) (pediatric): Secondary | ICD-10-CM | POA: Diagnosis not present

## 2019-10-20 LAB — SARS-COV-2, NAA 2 DAY TAT

## 2019-10-20 LAB — NOVEL CORONAVIRUS, NAA: SARS-CoV-2, NAA: NOT DETECTED

## 2019-10-26 DIAGNOSIS — R69 Illness, unspecified: Secondary | ICD-10-CM | POA: Diagnosis not present

## 2019-11-20 ENCOUNTER — Ambulatory Visit (INDEPENDENT_AMBULATORY_CARE_PROVIDER_SITE_OTHER): Payer: Medicare HMO | Admitting: Family Medicine

## 2019-11-20 ENCOUNTER — Encounter: Payer: Self-pay | Admitting: Family Medicine

## 2019-11-20 ENCOUNTER — Other Ambulatory Visit: Payer: Self-pay

## 2019-11-20 DIAGNOSIS — G4733 Obstructive sleep apnea (adult) (pediatric): Secondary | ICD-10-CM | POA: Diagnosis not present

## 2019-11-20 DIAGNOSIS — B029 Zoster without complications: Secondary | ICD-10-CM | POA: Diagnosis not present

## 2019-11-20 MED ORDER — VALACYCLOVIR HCL 1 G PO TABS: 1000.0000 mg | ORAL_TABLET | Freq: Three times a day (TID) | ORAL | 0 refills | Status: DC

## 2019-11-20 MED ORDER — HYDROXYZINE HCL 10 MG PO TABS: 10.0000 mg | ORAL_TABLET | Freq: Every evening | ORAL | 0 refills | Status: DC | PRN

## 2019-11-20 MED ORDER — TRAMADOL HCL 50 MG PO TABS: 50.0000 mg | ORAL_TABLET | Freq: Three times a day (TID) | ORAL | 0 refills | Status: AC | PRN
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
## 2019-11-20 NOTE — Patient Instructions (Signed)
You have shingles  Keep area clean with gentle soap and water  Avoid friction and heat   Take the valtrex as directed  Tramadol as needed for pain (caution of sedation)  Hydroxyzine instead of benadryl at night (caution of sedation)   Update if not starting to improve in a week or if worsening

## 2019-11-20 NOTE — Assessment & Plan Note (Signed)
In pt on humira and methotrexate  L breast -2 areas of vesicular rash  Tender in axilla  Px valcyclovir tid for 7d Hydroxyzine for night time itch (caution of sedation)  Tramadol for pain (caution of sedation)  inst to keep rash clean (soap and water) and avoid friction  Update if worse or not starting to improve after a week

## 2019-11-20 NOTE — Progress Notes (Signed)
Pre visit review using our clinic review tool, if applicable. No additional management support is needed unless otherwise documented below in the visit note. 

## 2019-12-05 ENCOUNTER — Other Ambulatory Visit: Payer: Self-pay | Admitting: Family Medicine

## 2019-12-05 DIAGNOSIS — R69 Illness, unspecified: Secondary | ICD-10-CM | POA: Diagnosis not present

## 2019-12-19 DIAGNOSIS — G4733 Obstructive sleep apnea (adult) (pediatric): Secondary | ICD-10-CM | POA: Diagnosis not present

## 2019-12-27 ENCOUNTER — Ambulatory Visit
Admission: RE | Admit: 2019-12-27 | Discharge: 2019-12-27 | Disposition: A | Discharge status: Home / Self Care | Payer: Medicare HMO | Source: Ambulatory Visit | Attending: Family Medicine | Admitting: Family Medicine

## 2019-12-27 ENCOUNTER — Other Ambulatory Visit: Payer: Self-pay

## 2019-12-27 DIAGNOSIS — Z1231 Encounter for screening mammogram for malignant neoplasm of breast: Secondary | ICD-10-CM | POA: Diagnosis not present

## 2019-12-30 ENCOUNTER — Other Ambulatory Visit: Payer: Self-pay | Admitting: Family Medicine

## 2020-01-04 ENCOUNTER — Ambulatory Visit: Payer: Medicare HMO | Admitting: Internal Medicine

## 2020-01-10 ENCOUNTER — Other Ambulatory Visit: Payer: Self-pay

## 2020-01-10 ENCOUNTER — Encounter: Payer: Self-pay | Admitting: Family Medicine

## 2020-01-10 ENCOUNTER — Ambulatory Visit (INDEPENDENT_AMBULATORY_CARE_PROVIDER_SITE_OTHER): Payer: Medicare HMO | Admitting: Family Medicine

## 2020-01-10 VITALS — BP 136/88 | HR 91 | Temp 96.9°F | Ht 68.0 in | Wt 204.4 lb

## 2020-01-10 DIAGNOSIS — E1165 Type 2 diabetes mellitus with hyperglycemia: Secondary | ICD-10-CM | POA: Diagnosis not present

## 2020-01-10 DIAGNOSIS — I10 Essential (primary) hypertension: Secondary | ICD-10-CM | POA: Diagnosis not present

## 2020-01-10 LAB — POCT GLYCOSYLATED HEMOGLOBIN (HGB A1C): Hemoglobin A1C: 6.1 % — AB (ref 4.0–5.6)

## 2020-01-10 NOTE — Assessment & Plan Note (Signed)
bp in fair control at this time  BP Readings from Last 1 Encounters:  01/10/20 136/88   No changes needed Most recent labs reviewed  Disc lifstyle change with low sodium diet and exercise  Plan to continue coreg 6.25 mg bid and hctz 12.5 mg daily

## 2020-01-10 NOTE — Assessment & Plan Note (Signed)
This continues to improve Lab Results  Component Value Date   HGBA1C 6.1 (A) 01/10/2020   avg glucose 125  Doing better with diet (cheated around holidays)  Would like to walk when able  On statin  Nl microalb  Plan to continue metformin 1000 mg bid  F/u 6 mo

## 2020-01-10 NOTE — Patient Instructions (Addendum)
Do the best you can with diet/exercise -getting back on track   Take care  A1c is excellent !   If you are interested in the shingles vaccine series (Shingrix), call your insurance or pharmacy to check on coverage and location it must be given.  If affordable - you can schedule it here or at your pharmacy depending on coverage   Follow up for annual exam in 6 months
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
## 2020-01-19 DIAGNOSIS — G4733 Obstructive sleep apnea (adult) (pediatric): Secondary | ICD-10-CM | POA: Diagnosis not present

## 2020-01-29 ENCOUNTER — Ambulatory Visit: Payer: Medicare HMO | Admitting: Internal Medicine

## 2020-02-19 DIAGNOSIS — G4733 Obstructive sleep apnea (adult) (pediatric): Secondary | ICD-10-CM | POA: Diagnosis not present

## 2020-02-27 ENCOUNTER — Encounter: Payer: Self-pay | Admitting: Family Medicine

## 2020-02-27 ENCOUNTER — Other Ambulatory Visit: Payer: Self-pay

## 2020-02-27 ENCOUNTER — Ambulatory Visit (INDEPENDENT_AMBULATORY_CARE_PROVIDER_SITE_OTHER)
Admission: RE | Admit: 2020-02-27 | Discharge: 2020-02-27 | Disposition: A | Discharge status: Home / Self Care | Payer: Medicare HMO | Source: Ambulatory Visit | Attending: Family Medicine | Admitting: Family Medicine

## 2020-02-27 ENCOUNTER — Ambulatory Visit (INDEPENDENT_AMBULATORY_CARE_PROVIDER_SITE_OTHER): Payer: Medicare HMO | Admitting: Family Medicine

## 2020-02-27 VITALS — BP 136/90 | HR 83 | Temp 97.0°F | Wt 206.3 lb

## 2020-02-27 DIAGNOSIS — M79671 Pain in right foot: Secondary | ICD-10-CM | POA: Diagnosis not present

## 2020-02-27 DIAGNOSIS — S92351A Displaced fracture of fifth metatarsal bone, right foot, initial encounter for closed fracture: Secondary | ICD-10-CM | POA: Diagnosis not present

## 2020-02-27 DIAGNOSIS — F418 Other specified anxiety disorders: Secondary | ICD-10-CM

## 2020-02-27 DIAGNOSIS — R69 Illness, unspecified: Secondary | ICD-10-CM | POA: Diagnosis not present

## 2020-02-27 DIAGNOSIS — S92511A Displaced fracture of proximal phalanx of right lesser toe(s), initial encounter for closed fracture: Secondary | ICD-10-CM | POA: Diagnosis not present

## 2020-02-27 MED ORDER — BUPROPION HCL ER (XL) 300 MG PO TB24
300.0000 mg | ORAL_TABLET | Freq: Every day | ORAL | 3 refills | Status: DC
Start: 2020-02-27 — End: 2021-03-24

## 2020-02-27 NOTE — Patient Instructions (Signed)
Xray of foot now  Ice and elevate Wrap if it helps  A rigid shoe may help walking   We will call with results   If you want counseling let us know  I sent wellbutrin to the pharmacy  Let us know if you get worse or if any side effects   Keep [place] posted  Benadryl is ok for sleep occasionally also

## 2020-02-27 NOTE — Assessment & Plan Note (Signed)
Predominantly depression  Will start wellbutrin xl back at 300 mg daily which has helped in the past  No imp with lexapro and zoloft in the past Reviewed stressors/ coping techniques/symptoms/ support sources/ tx options and side effects in detail today Offered counseling ref-declined  Enc self care and talking to friends or family or journaling  Plan to update

## 2020-02-27 NOTE — Assessment & Plan Note (Signed)
Pain in lateral foot after injuring 5th toe  Some swelling a month later and soreness Disc wrapping/buddy taping if needed, ice , elevation and trial of voltaren gel  Xray today  May consider post op shoe if needed
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
## 2020-02-28 ENCOUNTER — Telehealth: Payer: Medicare HMO | Admitting: Nutrition

## 2020-02-29 ENCOUNTER — Encounter: Payer: Self-pay | Admitting: Family Medicine

## 2020-02-29 DIAGNOSIS — S92501A Displaced unspecified fracture of right lesser toe(s), initial encounter for closed fracture: Secondary | ICD-10-CM

## 2020-02-29 DIAGNOSIS — M79671 Pain in right foot: Secondary | ICD-10-CM

## 2020-03-01 DIAGNOSIS — S92501A Displaced unspecified fracture of right lesser toe(s), initial encounter for closed fracture: Secondary | ICD-10-CM | POA: Insufficient documentation

## 2020-03-01 NOTE — Telephone Encounter (Signed)
See pt's response regarding her xray results

## 2020-03-04 ENCOUNTER — Other Ambulatory Visit: Payer: Self-pay

## 2020-03-04 ENCOUNTER — Encounter: Payer: Self-pay | Admitting: Podiatry

## 2020-03-04 ENCOUNTER — Ambulatory Visit: Payer: Medicare HMO | Admitting: Podiatry

## 2020-03-04 DIAGNOSIS — S92514A Nondisplaced fracture of proximal phalanx of right lesser toe(s), initial encounter for closed fracture: Secondary | ICD-10-CM | POA: Diagnosis not present

## 2020-03-04 DIAGNOSIS — L600 Ingrowing nail: Secondary | ICD-10-CM | POA: Diagnosis not present

## 2020-03-04 DIAGNOSIS — M79671 Pain in right foot: Secondary | ICD-10-CM | POA: Diagnosis not present

## 2020-03-04 NOTE — Progress Notes (Signed)
Subjective:   Patient ID: [name], female   DOB: 55 y.o.   MRN: [id number]   HPI Patient presents stating she thinks she broke the fifth toe on her right foot and also she is got a thick big toenail on that foot that can be bothersome.  States that the toe was swollen and had seem to be getting better but now hurts again and it happened about 4 weeks ago   Review of Systems  All other systems reviewed and are negative.       Objective:  Physical Exam Vitals and nursing note reviewed.  Constitutional:      Appearance: She is well-developed and well-nourished.  Cardiovascular:     Pulses: Intact distal pulses.  Pulmonary:     Effort: Pulmonary effort is normal.  Musculoskeletal:        General: Normal range of motion.  Skin:    General: Skin is warm.  Neurological:     Mental Status: She is alert.     Neurovascular status found to be intact muscle strength found to be adequate range of motion within normal limits.  Patient is noted to have a swollen fifth digit right that is painful at the proximal phalanx and also on the right hallux there is a very thickened nail that is dystrophic and moderately tender at times with a history of having the left one removed and the second nail removed both feet     Assessment:  Fractured fifth digit right along with damaged right hallux nail     Plan:  H&P reviewed both conditions and for the toe I have recommended continuing to wear open toed shoes and wide shoes and it should get better but may take 8 to 12 weeks and for the nail I do think it needs to be fixed and I explained permanent procedure she wants this done the skin to hold off currently and then get it fixed at her convenience.  Educated her on procedure today

## 2020-03-20 ENCOUNTER — Ambulatory Visit: Payer: Medicare HMO | Admitting: Family

## 2020-03-25 ENCOUNTER — Ambulatory Visit: Payer: Medicare HMO | Admitting: Internal Medicine

## 2020-04-03 ENCOUNTER — Other Ambulatory Visit: Payer: Self-pay | Admitting: Family Medicine

## 2020-04-19 ENCOUNTER — Ambulatory Visit
Admission: EM | Admit: 2020-04-19 | Discharge: 2020-04-19 | Disposition: A | Discharge status: Home / Self Care | Payer: Medicare HMO | Attending: Emergency Medicine | Admitting: Emergency Medicine

## 2020-04-19 ENCOUNTER — Other Ambulatory Visit: Payer: Self-pay

## 2020-04-19 ENCOUNTER — Encounter: Payer: Self-pay | Admitting: Emergency Medicine

## 2020-04-19 DIAGNOSIS — R0981 Nasal congestion: Secondary | ICD-10-CM | POA: Diagnosis not present

## 2020-04-19 DIAGNOSIS — J019 Acute sinusitis, unspecified: Secondary | ICD-10-CM

## 2020-04-19 MED ORDER — DOXYCYCLINE HYCLATE 100 MG PO CAPS: 100.0000 mg | ORAL_CAPSULE | Freq: Two times a day (BID) | ORAL | 0 refills | Status: DC

## 2020-04-19 NOTE — ED Provider Notes (Signed)
[facility]   [id number] 04/19/20 Arrival Time: 8502   CC: COVID symptoms  SUBJECTIVE: History from: patient.  [name] is a 55 y.o. female who presents with sinus congestion x 1 week.  Denies sick exposure to COVID, flu or strep. Has tried OTC medications without relief.  Symptoms are made worse at night.  Reports previous symptoms in the past with sinus infection.   Denies fever, chills, SOB, wheezing, chest pain, nausea, changes in bowel or bladder habits.     ROS: As per HPI.  All other pertinent ROS negative.     Past Medical History:  Diagnosis Date  . Allergy    allergic rhinitis  . Asthma   . Chronic headaches   . Degenerative disc disease   . Depression   . Eczema   . Fibromyalgia   . Fx of fibula 2009  . GERD (gastroesophageal reflux disease)   . Hemorrhoids   . Hypertension   . IBS (irritable bowel syndrome)   . Iritis, chronic   . Macular hole of right eye   . Sleep apnea    Past Surgical History:  Procedure Laterality Date  . ABDOMINAL HYSTERECTOMY    . ANTERIOR CRUCIATE LIGAMENT REPAIR  1996   small tear of ACL  . BREAST EXCISIONAL BIOPSY Left 1995  . BREAST SURGERY     left nipple removed - benign tumor  . BUNIONECTOMY    . COLONOSCOPY    . LAPAROSCOPY  08/2004   endometriosis - several   . TONSILLECTOMY     Allergies  Allergen Reactions  . Amlodipine     Pedal edema   . Azithromycin     Other reaction(s): GI Bleeding  . Cetirizine Hcl     REACTION: not effective  . Erythromycin     REACTION: stomach hurts  . Escitalopram Oxalate     REACTION: no improvement  . Lisinopril     REACTION: cough  . Loratadine     REACTION: not effective  . Nifedipine     REACTION: fatigued and tremor  . Sertraline Hcl     REACTION: no help  . Vyvanse [Lisdexamfetamine Dimesylate]     Headache and excitablility   No current facility-administered medications on file prior to encounter.   Current Outpatient Medications on File Prior  to Encounter  Medication Sig Dispense Refill  . acetaminophen (TYLENOL) 500 MG tablet OTC as directed.    . Adalimumab 40 MG/0.8ML PSKT Inject 40 mg into the skin once a week.     Marland Kitchen albuterol (PROVENTIL) (2.5 MG/3ML) 0.083% nebulizer solution Take 3 mLs (2.5 mg total) by nebulization every 4 (four) hours as needed for wheezing or shortness of breath. 75 mL 1  . albuterol (VENTOLIN HFA) 108 (90 Base) MCG/ACT inhaler Inhale 2 puffs into the lungs every 6 (six) hours as needed for wheezing or shortness of breath. 18 g 1  . budesonide-formoterol (SYMBICORT) 160-4.5 MCG/ACT inhaler Inhale 2 puffs into the lungs 2 (two) times daily. 10.2 g 6  . buPROPion (WELLBUTRIN XL) 300 MG 24 hr tablet Take 1 tablet (300 mg total) by mouth daily. 90 tablet 3  . carvedilol (COREG) 6.25 MG tablet TAKE 1 TABLET BY MOUTH TWICE DAILY WITH MEALS 180 tablet 0  . Cholecalciferol (VITAMIN D3 PO) Take 1 capsule by mouth daily.    . [employer] TRIPLE MAGNESIUM COMPLEX PO Take 1 capsule by mouth daily.    . cyclobenzaprine (FLEXERIL) 10 MG tablet Take 1 tablet (10 mg  total) by mouth 3 (three) times daily as needed for muscle spasms. Caution of sedation 30 tablet 0  . diphenhydrAMINE (BENADRYL) 25 mg capsule Take 25 mg by mouth as needed. OTC as directed.    Marland Kitchen esomeprazole (NEXIUM) 20 MG capsule Take 1 capsule (20 mg total) by mouth daily at 12 noon. 90 capsule 3  . fluconazole (DIFLUCAN) 150 MG tablet Take one pill by mouth every 3 days 3 tablet 0  . fluticasone (FLONASE) 50 MCG/ACT nasal spray Place 2 sprays into both nostrils daily.    . folic acid (FOLVITE) 1 MG tablet Take 3 mg by mouth 4 (four) times a week.     . hydrochlorothiazide (MICROZIDE) 12.5 MG capsule Take 1 capsule by mouth once daily 90 capsule 1  . hydrOXYzine (ATARAX/VISTARIL) 10 MG tablet Take 1 tablet (10 mg total) by mouth at bedtime as needed. 15 tablet 0  . ketorolac (ACULAR) 0.5 % ophthalmic solution 1 drop 4 (four) times daily.    . Lancets (ONETOUCH DELICA  PLUS [id number]) MISC SMARTSIG:1 Lancet(s) Topical Twice Daily PRN    . leucovorin (WELLCOVORIN) 5 MG tablet Take 5 mg by mouth 2 (two) times a week.    . magnesium oxide (MAG-OX) 400 MG tablet Take 400 mg by mouth daily.    . metFORMIN (GLUCOPHAGE) 1000 MG tablet Take 1 tablet (1,000 mg total) by mouth 2 (two) times daily with a meal. 180 tablet 3  . Methotrexate 25 MG/ML SOSY Inject 25 mg into the skin once a week.    . methotrexate 50 MG/2ML injection Inject 25 mg into the vein once a week.    . metroNIDAZOLE (METROGEL VAGINAL) 0.75 % vaginal gel Place 1 Applicatorful vaginally at bedtime. 70 g 0  . Multiple Vitamins-Minerals (EMERGEN-C IMMUNE PLUS PO) Take 50 mg by mouth 2 times daily at 12 noon and 4 pm.    . nystatin (MYCOSTATIN) 100000 UNIT/ML suspension Take 5 mLs (500,000 Units total) by mouth 3 (three) times daily. Swish and swallow 150 mL 0  . ONETOUCH VERIO test strip USE 1 STRIP TO CHECK GLUCOSE TWICE DAILY AND  AS  NEEDED 100 each 0  . prednisoLONE acetate (PRED FORTE) 1 % ophthalmic suspension Place 4 drops into both eyes daily.    . rosuvastatin (CRESTOR) 5 MG tablet Take 1 tablet (5 mg total) by mouth every other day. 45 tablet 3  . valACYclovir (VALTREX) 1000 MG tablet Take 1 tablet (1,000 mg total) by mouth 3 (three) times daily. 21 tablet 0   Social History   Socioeconomic History  . Marital status: Divorced    Spouse name: Not on file  . Number of children: 1  . Years of education: Not on file  . Highest education level: Not on file  Occupational History  . Occupation: Disabled due to fibromyalgia    Employer: UNEMPLOYED  Tobacco Use  . Smoking status: Never Smoker  . Smokeless tobacco: Never Used  Vaping Use  . Vaping Use: Never used  Substance and Sexual Activity  . Alcohol use: Never    Alcohol/week: 0.0 standard drinks  . Drug use: No  . Sexual activity: Never  Other Topics Concern  . Not on file  Social History Narrative   1 caffeine drink daily     Social Determinants of Health   Financial Resource Strain: Not on file  Food Insecurity: Not on file  Transportation Needs: Not on file  Physical Activity: Not on file  Stress: Not on file  Social Connections: Not on file  Intimate Partner Violence: Not on file   Family History  Problem Relation Age of Onset  . Hypertension Mother   . Thyroid disease Mother   . Fibromyalgia Mother   . Colon polyps Mother   . Diabetes Father   . Kidney cancer Maternal Grandmother   . Colon cancer Neg Hx   . Esophageal cancer Neg Hx   . Stomach cancer Neg Hx   . Rectal cancer Neg Hx   . Allergic rhinitis Neg Hx   . Angioedema Neg Hx   . Asthma Neg Hx   . Atopy Neg Hx   . Eczema Neg Hx   . Immunodeficiency Neg Hx   . Urticaria Neg Hx     OBJECTIVE:  Vitals:   04/19/20 0847  BP: (!) 175/115  Pulse: 91  Resp: 18  Temp: 98.9 F (37.2 C)  TempSrc: Oral  SpO2: 95%    General appearance: alert; fatigued appearing, nontoxic; speaking in full sentences and tolerating own secretions HEENT: NCAT; Ears: EACs clear, TMs pearly gray; Eyes: PERRL.  EOM grossly intact. Sinus: TTP; Nose: nares patent without rhinorrhea, Throat: oropharynx clear, tonsils non erythematous or enlarged, uvula midline  Neck: supple without LAD Lungs: unlabored respirations, symmetrical air entry; cough: absent; no respiratory distress; CTAB Heart: regular rate and rhythm.  Skin: warm and dry Psychological: alert and cooperative; normal mood and affect   ASSESSMENT & PLAN:  1. Sinus congestion   2. Acute non-recurrent sinusitis, unspecified location     Meds ordered this encounter  Medications  . doxycycline (VIBRAMYCIN) 100 MG capsule    Sig: Take 1 capsule (100 mg total) by mouth 2 (two) times daily.    Dispense:  20 capsule    Refill:  0    Order Specific Question:   Supervising Provider    Answer:   Raylene Everts [[id number]]    Rest and push fluids Doxycycline prescribed.  Take as directed and to  completion Continue with OTC ibuprofen/tylenol as needed for pain Follow up with PCP or [facility] if symptoms persists Return or go to the ED if you have any new or worsening symptoms such as fever, chills, worsening sinus pain/pressure, cough, sore throat, chest pain, shortness of breath, abdominal pain, changes in bowel or bladder habits, etc...    Reviewed expectations re: course of current medical issues. Questions answered. Outlined signs and symptoms indicating need for more acute intervention. Patient verbalized understanding. After Visit Summary given.         Lestine Box, PA-C 04/19/20 [phone]

## 2020-04-19 NOTE — Discharge Instructions (Addendum)
Rest and push fluids Doxycycline prescribed.  Take as directed and to completion Continue with OTC ibuprofen/tylenol as needed for pain Follow up with PCP or [facility] if symptoms persists Return or go to the ED if you have any new or worsening symptoms such as fever, chills, worsening sinus pain/pressure, cough, sore throat, chest pain, shortness of breath, abdominal pain, changes in bowel or bladder habits, etc..Marland Kitchen

## 2020-04-19 NOTE — ED Triage Notes (Signed)
Sinus congestion that started on Friday.  Teeth pain and  Ear pain

## 2020-05-13 ENCOUNTER — Encounter: Payer: Self-pay | Admitting: Emergency Medicine

## 2020-05-13 ENCOUNTER — Ambulatory Visit (INDEPENDENT_AMBULATORY_CARE_PROVIDER_SITE_OTHER): Payer: Medicare HMO

## 2020-05-13 ENCOUNTER — Ambulatory Visit
Admission: EM | Admit: 2020-05-13 | Discharge: 2020-05-13 | Disposition: A | Discharge status: Home / Self Care | Payer: Medicare HMO | Attending: Family Medicine | Admitting: Family Medicine

## 2020-05-13 ENCOUNTER — Other Ambulatory Visit: Payer: Self-pay

## 2020-05-13 DIAGNOSIS — H66002 Acute suppurative otitis media without spontaneous rupture of ear drum, left ear: Secondary | ICD-10-CM

## 2020-05-13 DIAGNOSIS — R0602 Shortness of breath: Secondary | ICD-10-CM

## 2020-05-13 DIAGNOSIS — R059 Cough, unspecified: Secondary | ICD-10-CM

## 2020-05-13 DIAGNOSIS — R5383 Other fatigue: Secondary | ICD-10-CM | POA: Diagnosis not present

## 2020-05-13 DIAGNOSIS — J069 Acute upper respiratory infection, unspecified: Secondary | ICD-10-CM

## 2020-05-13 DIAGNOSIS — Z7689 Persons encountering health services in other specified circumstances: Secondary | ICD-10-CM | POA: Diagnosis not present

## 2020-05-13 MED ORDER — CEFDINIR 300 MG PO CAPS: 300.0000 mg | ORAL_CAPSULE | Freq: Two times a day (BID) | ORAL | 0 refills | Status: AC

## 2020-05-13 MED ORDER — BENZONATATE 100 MG PO CAPS: 100.0000 mg | ORAL_CAPSULE | Freq: Three times a day (TID) | ORAL | 0 refills | Status: DC

## 2020-05-13 NOTE — Discharge Instructions (Addendum)
I have sent in Cefdinir for you to take twice a day for 7 days.  I have sent in [place] for you to use one capsule every 8 hours as needed for cough.  Chest xray today negative for pneumonia.   I would use your nebulizer more routinely over the next few days.  Your COVID and Influenza tests are pending.  You should self quarantine until the test results are back.    Take Tylenol or ibuprofen as needed for fever or discomfort.  Rest and keep yourself hydrated.    Follow-up with your primary care provider if your symptoms are not improving.

## 2020-05-13 NOTE — ED Provider Notes (Signed)
[place]   [id number] 05/13/20 Arrival Time: 0865   CC: COVID symptoms  SUBJECTIVE: History from: patient.  PHARRAH [name] is a 55 y.o. female who presents with cough, congestion, fatigue for the last 5 days. Reports left ear pain that kept her awake last night. Denies sick exposure to COVID, flu or strep. Denies recent travel. Has positive history of Covid about a year ago. Has completed Covid vaccines and booster. Has completed flu vaccine. Has not taken OTC medications for this. There are no aggravating or alleviating factors. Denies previous symptoms in the past. Denies fever, chills, sinus pain, rhinorrhea, sore throat, SOB, wheezing, chest pain, nausea, changes in bowel or bladder habits.    ROS: As per HPI.  All other pertinent ROS negative.     Past Medical History:  Diagnosis Date  . Allergy    allergic rhinitis  . Asthma   . Chronic headaches   . Degenerative disc disease   . Depression   . Eczema   . Fibromyalgia   . Fx of fibula 2009  . GERD (gastroesophageal reflux disease)   . Hemorrhoids   . Hypertension   . IBS (irritable bowel syndrome)   . Iritis, chronic   . Macular hole of right eye   . Sleep apnea    Past Surgical History:  Procedure Laterality Date  . ABDOMINAL HYSTERECTOMY    . ANTERIOR CRUCIATE LIGAMENT REPAIR  1996   small tear of ACL  . BREAST EXCISIONAL BIOPSY Left 1995  . BREAST SURGERY     left nipple removed - benign tumor  . BUNIONECTOMY    . COLONOSCOPY    . LAPAROSCOPY  08/2004   endometriosis - several   . TONSILLECTOMY     Allergies  Allergen Reactions  . Amlodipine     Pedal edema   . Azithromycin     Other reaction(s): GI Bleeding  . Cetirizine Hcl     REACTION: not effective  . Erythromycin     REACTION: stomach hurts  . Escitalopram Oxalate     REACTION: no improvement  . Lisinopril     REACTION: cough  . Loratadine     REACTION: not effective  . Nifedipine     REACTION: fatigued and tremor  .  Sertraline Hcl     REACTION: no help  . Vyvanse [Lisdexamfetamine Dimesylate]     Headache and excitablility   No current facility-administered medications on file prior to encounter.   Current Outpatient Medications on File Prior to Encounter  Medication Sig Dispense Refill  . acetaminophen (TYLENOL) 500 MG tablet OTC as directed.    . Adalimumab 40 MG/0.8ML PSKT Inject 40 mg into the skin once a week.     Marland Kitchen albuterol (PROVENTIL) (2.5 MG/3ML) 0.083% nebulizer solution Take 3 mLs (2.5 mg total) by nebulization every 4 (four) hours as needed for wheezing or shortness of breath. 75 mL 1  . albuterol (VENTOLIN HFA) 108 (90 Base) MCG/ACT inhaler Inhale 2 puffs into the lungs every 6 (six) hours as needed for wheezing or shortness of breath. 18 g 1  . budesonide-formoterol (SYMBICORT) 160-4.5 MCG/ACT inhaler Inhale 2 puffs into the lungs 2 (two) times daily. 10.2 g 6  . buPROPion (WELLBUTRIN XL) 300 MG 24 hr tablet Take 1 tablet (300 mg total) by mouth daily. 90 tablet 3  . carvedilol (COREG) 6.25 MG tablet TAKE 1 TABLET BY MOUTH TWICE DAILY WITH MEALS 180 tablet 0  . Cholecalciferol (VITAMIN D3 PO) Take 1  capsule by mouth daily.    . [employer] TRIPLE MAGNESIUM COMPLEX PO Take 1 capsule by mouth daily.    . cyclobenzaprine (FLEXERIL) 10 MG tablet Take 1 tablet (10 mg total) by mouth 3 (three) times daily as needed for muscle spasms. Caution of sedation 30 tablet 0  . diphenhydrAMINE (BENADRYL) 25 mg capsule Take 25 mg by mouth as needed. OTC as directed.    Marland Kitchen esomeprazole (NEXIUM) 20 MG capsule Take 1 capsule (20 mg total) by mouth daily at 12 noon. 90 capsule 3  . fluconazole (DIFLUCAN) 150 MG tablet Take one pill by mouth every 3 days 3 tablet 0  . fluticasone (FLONASE) 50 MCG/ACT nasal spray Place 2 sprays into both nostrils daily.    . folic acid (FOLVITE) 1 MG tablet Take 3 mg by mouth 4 (four) times a week.     . hydrochlorothiazide (MICROZIDE) 12.5 MG capsule Take 1 capsule by mouth once daily 90  capsule 1  . hydrOXYzine (ATARAX/VISTARIL) 10 MG tablet Take 1 tablet (10 mg total) by mouth at bedtime as needed. 15 tablet 0  . ketorolac (ACULAR) 0.5 % ophthalmic solution 1 drop 4 (four) times daily.    . Lancets (ONETOUCH DELICA PLUS [id number]) MISC SMARTSIG:1 Lancet(s) Topical Twice Daily PRN    . leucovorin (WELLCOVORIN) 5 MG tablet Take 5 mg by mouth 2 (two) times a week.    . magnesium oxide (MAG-OX) 400 MG tablet Take 400 mg by mouth daily.    . metFORMIN (GLUCOPHAGE) 1000 MG tablet Take 1 tablet (1,000 mg total) by mouth 2 (two) times daily with a meal. 180 tablet 3  . Methotrexate 25 MG/ML SOSY Inject 25 mg into the skin once a week.    . methotrexate 50 MG/2ML injection Inject 25 mg into the vein once a week.    . metroNIDAZOLE (METROGEL VAGINAL) 0.75 % vaginal gel Place 1 Applicatorful vaginally at bedtime. 70 g 0  . Multiple Vitamins-Minerals (EMERGEN-C IMMUNE PLUS PO) Take 50 mg by mouth 2 times daily at 12 noon and 4 pm.    . nystatin (MYCOSTATIN) 100000 UNIT/ML suspension Take 5 mLs (500,000 Units total) by mouth 3 (three) times daily. Swish and swallow 150 mL 0  . ONETOUCH VERIO test strip USE 1 STRIP TO CHECK GLUCOSE TWICE DAILY AND  AS  NEEDED 100 each 0  . prednisoLONE acetate (PRED FORTE) 1 % ophthalmic suspension Place 4 drops into both eyes daily.    . rosuvastatin (CRESTOR) 5 MG tablet Take 1 tablet (5 mg total) by mouth every other day. 45 tablet 3  . valACYclovir (VALTREX) 1000 MG tablet Take 1 tablet (1,000 mg total) by mouth 3 (three) times daily. 21 tablet 0   Social History   Socioeconomic History  . Marital status: Divorced    Spouse name: Not on file  . Number of children: 1  . Years of education: Not on file  . Highest education level: Not on file  Occupational History  . Occupation: Disabled due to fibromyalgia    Employer: UNEMPLOYED  Tobacco Use  . Smoking status: Never Smoker  . Smokeless tobacco: Never Used  Vaping Use  . Vaping Use: Never used   Substance and Sexual Activity  . Alcohol use: Never    Alcohol/week: 0.0 standard drinks  . Drug use: No  . Sexual activity: Never  Other Topics Concern  . Not on file  Social History Narrative   1 caffeine drink daily    Social Determinants of  Health   Financial Resource Strain: Not on file  Food Insecurity: Not on file  Transportation Needs: Not on file  Physical Activity: Not on file  Stress: Not on file  Social Connections: Not on file  Intimate Partner Violence: Not on file   Family History  Problem Relation Age of Onset  . Hypertension Mother   . Thyroid disease Mother   . Fibromyalgia Mother   . Colon polyps Mother   . Diabetes Father   . Kidney cancer Maternal Grandmother   . Colon cancer Neg Hx   . Esophageal cancer Neg Hx   . Stomach cancer Neg Hx   . Rectal cancer Neg Hx   . Allergic rhinitis Neg Hx   . Angioedema Neg Hx   . Asthma Neg Hx   . Atopy Neg Hx   . Eczema Neg Hx   . Immunodeficiency Neg Hx   . Urticaria Neg Hx     OBJECTIVE:  Vitals:   05/13/20 0819  BP: (!) 160/112  Pulse: 92  Resp: 17  Temp: 98.8 F (37.1 C)  TempSrc: Oral  SpO2: 95%     General appearance: alert; appears fatigued, but nontoxic; speaking in full sentences and tolerating own secretions HEENT: NCAT; Ears: EACs clear, R TM pearly gray, L TM erythematous, bulging, with effusion; Eyes: PERRL.  EOM grossly intact. Sinuses: nontender; Nose: nares patent with clear rhinorrhea, Throat: oropharynx erythematous, cobblestoning present, tonsils non erythematous or enlarged, uvula midline  Neck: supple with LAD Lungs: unlabored respirations, symmetrical air entry; cough: mild; no respiratory distress; diminished lung sounds to bilateral lower lobes Heart: regular rate and rhythm.  Radial pulses 2+ symmetrical bilaterally Skin: warm and dry Psychological: alert and cooperative; normal mood and affect  LABS:  No results found for this or any previous visit (from the past 24  hour(s)).   ASSESSMENT & PLAN:  1. Non-recurrent acute suppurative otitis media of left ear without spontaneous rupture of tympanic membrane   2. URI with cough and congestion     Meds ordered this encounter  Medications  . benzonatate (TESSALON) 100 MG capsule    Sig: Take 1 capsule (100 mg total) by mouth every 8 (eight) hours.    Dispense:  21 capsule    Refill:  0    Order Specific Question:   Supervising Provider    Answer:   Chase Picket [id number]  . cefdinir (OMNICEF) 300 MG capsule    Sig: Take 1 capsule (300 mg total) by mouth 2 (two) times daily for 10 days.    Dispense:  20 capsule    Refill:  0    Order Specific Question:   Supervising Provider    Answer:   Chase Picket [id number]    Chest xray negative in office today for pneumonia Prescribed cefdinir BID x 10 days for L otitis media Prescribed tessalon perles for cough Continue supportive care at home COVID and flu testing ordered.  It will take between 2-3 days for test results. Someone will contact you regarding abnormal results.   Patient should remain in quarantine until they have received Covid results.  If negative you may resume normal activities (go back to work/school) while practicing hand hygiene, social distance, and mask wearing.  If positive, patient should remain in quarantine for at least 5 days from symptom onset AND greater than 72 hours after symptoms resolution (absence of fever without the use of fever-reducing medication and improvement in respiratory symptoms), whichever is longer Get plenty  of rest and push fluids Use OTC zyrtec for nasal congestion, runny nose, and/or sore throat Use OTC flonase for nasal congestion and runny nose Use medications daily for symptom relief Use OTC medications like ibuprofen or tylenol as needed fever or pain Call or go to the ED if you have any new or worsening symptoms such as fever, worsening cough, shortness of breath, chest tightness, chest pain,  turning blue, changes in mental status.  Reviewed expectations re: course of current medical issues. Questions answered. Outlined signs and symptoms indicating need for more acute intervention. Patient verbalized understanding. After Visit Summary given.         Faustino Congress, NP 05/13/20 [phone]

## 2020-05-13 NOTE — ED Triage Notes (Signed)
LT ear pain, sore throat, cough since Wednesday of last week

## 2020-05-14 LAB — COVID-19, FLU A+B NAA
Influenza A, NAA: NOT DETECTED
Influenza B, NAA: NOT DETECTED
SARS-CoV-2, NAA: NOT DETECTED

## 2020-05-16 ENCOUNTER — Ambulatory Visit
Admission: EM | Admit: 2020-05-16 | Discharge: 2020-05-16 | Disposition: A | Discharge status: Home / Self Care | Payer: Medicare HMO | Attending: Emergency Medicine | Admitting: Emergency Medicine

## 2020-05-16 ENCOUNTER — Encounter: Payer: Self-pay | Admitting: Emergency Medicine

## 2020-05-16 DIAGNOSIS — H5789 Other specified disorders of eye and adnexa: Secondary | ICD-10-CM | POA: Diagnosis not present

## 2020-05-16 DIAGNOSIS — H109 Unspecified conjunctivitis: Secondary | ICD-10-CM

## 2020-05-16 DIAGNOSIS — R059 Cough, unspecified: Secondary | ICD-10-CM

## 2020-05-16 MED ORDER — POLYMYXIN B-TRIMETHOPRIM 10000-0.1 UNIT/ML-% OP SOLN: 1.0000 [drp] | Freq: Four times a day (QID) | OPHTHALMIC | 0 refills | Status: AC

## 2020-05-16 MED ORDER — PROMETHAZINE-DM 6.25-15 MG/5ML PO SYRP: 5.0000 mL | ORAL_SOLUTION | Freq: Every evening | ORAL | 0 refills | Status: DC | PRN

## 2020-05-16 NOTE — ED Triage Notes (Signed)
Pt concerned she has pink eye in LT eye and has cough that is worse

## 2020-05-16 NOTE — ED Provider Notes (Signed)
[place]   [id number] 05/16/20 Arrival Time: 0809  CC: Red eye  SUBJECTIVE:  [name] is a 55 y.o. female who presents with complaint of LT eye irritation and white drainage x 1 dya.  Denies a precipitating event, trauma, or close contacts with similar symptoms.  Denies alleviating or aggravating factors.  Denies similar symptoms in the past.  Denies fever, chills, nausea, vomiting, eye pain, painful eye movements, itching, vision changes, double vision, FB sensation, periorbital erythema.      Also request cough medication.  Cough is keeping her up at night.  Currently on cefdinir for ear infection.    ROS: As per HPI.  All other pertinent ROS negative.     Past Medical History:  Diagnosis Date  . Allergy    allergic rhinitis  . Asthma   . Chronic headaches   . Degenerative disc disease   . Depression   . Eczema   . Fibromyalgia   . Fx of fibula 2009  . GERD (gastroesophageal reflux disease)   . Hemorrhoids   . Hypertension   . IBS (irritable bowel syndrome)   . Iritis, chronic   . Macular hole of right eye   . Sleep apnea    Past Surgical History:  Procedure Laterality Date  . ABDOMINAL HYSTERECTOMY    . ANTERIOR CRUCIATE LIGAMENT REPAIR  1996   small tear of ACL  . BREAST EXCISIONAL BIOPSY Left 1995  . BREAST SURGERY     left nipple removed - benign tumor  . BUNIONECTOMY    . COLONOSCOPY    . LAPAROSCOPY  08/2004   endometriosis - several   . TONSILLECTOMY     Allergies  Allergen Reactions  . Amlodipine     Pedal edema   . Azithromycin     Other reaction(s): GI Bleeding  . Cetirizine Hcl     REACTION: not effective  . Erythromycin     REACTION: stomach hurts  . Escitalopram Oxalate     REACTION: no improvement  . Lisinopril     REACTION: cough  . Loratadine     REACTION: not effective  . Nifedipine     REACTION: fatigued and tremor  . Sertraline Hcl     REACTION: no help  . Vyvanse [Lisdexamfetamine Dimesylate]     Headache  and excitablility   No current facility-administered medications on file prior to encounter.   Current Outpatient Medications on File Prior to Encounter  Medication Sig Dispense Refill  . acetaminophen (TYLENOL) 500 MG tablet OTC as directed.    . Adalimumab 40 MG/0.8ML PSKT Inject 40 mg into the skin once a week.     Marland Kitchen albuterol (PROVENTIL) (2.5 MG/3ML) 0.083% nebulizer solution Take 3 mLs (2.5 mg total) by nebulization every 4 (four) hours as needed for wheezing or shortness of breath. 75 mL 1  . albuterol (VENTOLIN HFA) 108 (90 Base) MCG/ACT inhaler Inhale 2 puffs into the lungs every 6 (six) hours as needed for wheezing or shortness of breath. 18 g 1  . benzonatate (TESSALON) 100 MG capsule Take 1 capsule (100 mg total) by mouth every 8 (eight) hours. 21 capsule 0  . budesonide-formoterol (SYMBICORT) 160-4.5 MCG/ACT inhaler Inhale 2 puffs into the lungs 2 (two) times daily. 10.2 g 6  . buPROPion (WELLBUTRIN XL) 300 MG 24 hr tablet Take 1 tablet (300 mg total) by mouth daily. 90 tablet 3  . carvedilol (COREG) 6.25 MG tablet TAKE 1 TABLET BY MOUTH TWICE DAILY WITH MEALS  180 tablet 0  . cefdinir (OMNICEF) 300 MG capsule Take 1 capsule (300 mg total) by mouth 2 (two) times daily for 10 days. 20 capsule 0  . Cholecalciferol (VITAMIN D3 PO) Take 1 capsule by mouth daily.    . [employer] TRIPLE MAGNESIUM COMPLEX PO Take 1 capsule by mouth daily.    . cyclobenzaprine (FLEXERIL) 10 MG tablet Take 1 tablet (10 mg total) by mouth 3 (three) times daily as needed for muscle spasms. Caution of sedation 30 tablet 0  . diphenhydrAMINE (BENADRYL) 25 mg capsule Take 25 mg by mouth as needed. OTC as directed.    Marland Kitchen esomeprazole (NEXIUM) 20 MG capsule Take 1 capsule (20 mg total) by mouth daily at 12 noon. 90 capsule 3  . fluconazole (DIFLUCAN) 150 MG tablet Take one pill by mouth every 3 days 3 tablet 0  . fluticasone (FLONASE) 50 MCG/ACT nasal spray Place 2 sprays into both nostrils daily.    . folic acid (FOLVITE)  1 MG tablet Take 3 mg by mouth 4 (four) times a week.     . hydrochlorothiazide (MICROZIDE) 12.5 MG capsule Take 1 capsule by mouth once daily 90 capsule 1  . hydrOXYzine (ATARAX/VISTARIL) 10 MG tablet Take 1 tablet (10 mg total) by mouth at bedtime as needed. 15 tablet 0  . ketorolac (ACULAR) 0.5 % ophthalmic solution 1 drop 4 (four) times daily.    . Lancets (ONETOUCH DELICA PLUS [id number]) MISC SMARTSIG:1 Lancet(s) Topical Twice Daily PRN    . leucovorin (WELLCOVORIN) 5 MG tablet Take 5 mg by mouth 2 (two) times a week.    . magnesium oxide (MAG-OX) 400 MG tablet Take 400 mg by mouth daily.    . metFORMIN (GLUCOPHAGE) 1000 MG tablet Take 1 tablet (1,000 mg total) by mouth 2 (two) times daily with a meal. 180 tablet 3  . Methotrexate 25 MG/ML SOSY Inject 25 mg into the skin once a week.    . methotrexate 50 MG/2ML injection Inject 25 mg into the vein once a week.    . metroNIDAZOLE (METROGEL VAGINAL) 0.75 % vaginal gel Place 1 Applicatorful vaginally at bedtime. 70 g 0  . Multiple Vitamins-Minerals (EMERGEN-C IMMUNE PLUS PO) Take 50 mg by mouth 2 times daily at 12 noon and 4 pm.    . nystatin (MYCOSTATIN) 100000 UNIT/ML suspension Take 5 mLs (500,000 Units total) by mouth 3 (three) times daily. Swish and swallow 150 mL 0  . ONETOUCH VERIO test strip USE 1 STRIP TO CHECK GLUCOSE TWICE DAILY AND  AS  NEEDED 100 each 0  . prednisoLONE acetate (PRED FORTE) 1 % ophthalmic suspension Place 4 drops into both eyes daily.    . rosuvastatin (CRESTOR) 5 MG tablet Take 1 tablet (5 mg total) by mouth every other day. 45 tablet 3  . valACYclovir (VALTREX) 1000 MG tablet Take 1 tablet (1,000 mg total) by mouth 3 (three) times daily. 21 tablet 0   Social History   Socioeconomic History  . Marital status: Divorced    Spouse name: Not on file  . Number of children: 1  . Years of education: Not on file  . Highest education level: Not on file  Occupational History  . Occupation: Disabled due to fibromyalgia     Employer: UNEMPLOYED  Tobacco Use  . Smoking status: Never Smoker  . Smokeless tobacco: Never Used  Vaping Use  . Vaping Use: Never used  Substance and Sexual Activity  . Alcohol use: Never    Alcohol/week: 0.0 standard  drinks  . Drug use: No  . Sexual activity: Never  Other Topics Concern  . Not on file  Social History Narrative   1 caffeine drink daily    Social Determinants of Health   Financial Resource Strain: Not on file  Food Insecurity: Not on file  Transportation Needs: Not on file  Physical Activity: Not on file  Stress: Not on file  Social Connections: Not on file  Intimate Partner Violence: Not on file   Family History  Problem Relation Age of Onset  . Hypertension Mother   . Thyroid disease Mother   . Fibromyalgia Mother   . Colon polyps Mother   . Diabetes Father   . Kidney cancer Maternal Grandmother   . Colon cancer Neg Hx   . Esophageal cancer Neg Hx   . Stomach cancer Neg Hx   . Rectal cancer Neg Hx   . Allergic rhinitis Neg Hx   . Angioedema Neg Hx   . Asthma Neg Hx   . Atopy Neg Hx   . Eczema Neg Hx   . Immunodeficiency Neg Hx   . Urticaria Neg Hx     OBJECTIVE:   Vitals:   05/16/20 0817 05/16/20 0818  BP: (!) 157/100   Pulse: 94   Resp: 18   Temp:  98.7 F (37.1 C)  TempSrc:  Oral  SpO2: 98%     General appearance: alert; no distress Eyes: LT slight conjunctival erythema. PERRL; EOMI without discomfort;  no obvious drainage HENT: NCAT; EACs clear, TMs pearly gray; nares patent Neck: supple Lungs: clear to auscultation bilaterally Heart: regular rate and rhythm Skin: warm and dry Psychological: alert and cooperative; normal mood and affect   ASSESSMENT & PLAN:  1. Irritation of left eye   2. Bacterial conjunctivitis of left eye   3. Cough     Meds ordered this encounter  Medications  . trimethoprim-polymyxin b (POLYTRIM) ophthalmic solution    Sig: Place 1 drop into the left eye 4 (four) times daily for 7 days.     Dispense:  10 mL    Refill:  0    Order Specific Question:   Supervising Provider    Answer:   Raylene Everts [[id number]]  . promethazine-dextromethorphan (PROMETHAZINE-DM) 6.25-15 MG/5ML syrup    Sig: Take 5 mLs by mouth at bedtime as needed for cough.    Dispense:  60 mL    Refill:  0    Order Specific Question:   Supervising Provider    Answer:   Raylene Everts [[id number]]    Use eye drops as prescribed and to completion Dispose of old contacts and wear glasses until you have finished course of antibiotic eye drops Wash pillow cases, wash hands regularly with soap and water, avoid touching your face and eyes, wash door handles, light switches, remotes and other objects you frequently touch Return or follow up with PCP if symptoms persists such as fever, chills, redness, swelling, eye pain, painful eye movements, vision changes, etc...  Promethazine for cough   Reviewed expectations re: course of current medical issues. Questions answered. Outlined signs and symptoms indicating need for more acute intervention. Patient verbalized understanding. After Visit Summary given.   Lestine Box, PA-C 05/16/20 0830

## 2020-05-16 NOTE — Discharge Instructions (Signed)
Use eye drops as prescribed and to completion Dispose of old contacts and wear glasses until you have finished course of antibiotic eye drops Wash pillow cases, wash hands regularly with soap and water, avoid touching your face and eyes, wash door handles, light switches, remotes and other objects you frequently touch Return or follow up with PCP if symptoms persists such as fever, chills, redness, swelling, eye pain, painful eye movements, vision changes, etc...  Promethazine for cough

## 2020-05-20 ENCOUNTER — Encounter: Payer: Self-pay | Admitting: Family Medicine

## 2020-05-20 MED ORDER — FLUCONAZOLE 150 MG PO TABS: 150.0000 mg | ORAL_TABLET | Freq: Once | ORAL | 0 refills | Status: AC

## 2020-05-21 DIAGNOSIS — D2261 Melanocytic nevi of right upper limb, including shoulder: Secondary | ICD-10-CM | POA: Diagnosis not present

## 2020-05-21 DIAGNOSIS — D2239 Melanocytic nevi of other parts of face: Secondary | ICD-10-CM | POA: Diagnosis not present

## 2020-05-21 DIAGNOSIS — D2262 Melanocytic nevi of left upper limb, including shoulder: Secondary | ICD-10-CM | POA: Diagnosis not present

## 2020-05-21 DIAGNOSIS — D2271 Melanocytic nevi of right lower limb, including hip: Secondary | ICD-10-CM | POA: Diagnosis not present

## 2020-05-21 DIAGNOSIS — L821 Other seborrheic keratosis: Secondary | ICD-10-CM | POA: Diagnosis not present

## 2020-05-21 DIAGNOSIS — D1801 Hemangioma of skin and subcutaneous tissue: Secondary | ICD-10-CM | POA: Diagnosis not present

## 2020-05-31 DIAGNOSIS — H35342 Macular cyst, hole, or pseudohole, left eye: Secondary | ICD-10-CM | POA: Diagnosis not present

## 2020-05-31 DIAGNOSIS — H35353 Cystoid macular degeneration, bilateral: Secondary | ICD-10-CM | POA: Diagnosis not present

## 2020-05-31 DIAGNOSIS — Z961 Presence of intraocular lens: Secondary | ICD-10-CM | POA: Diagnosis not present

## 2020-05-31 DIAGNOSIS — Z79899 Other long term (current) drug therapy: Secondary | ICD-10-CM | POA: Diagnosis not present

## 2020-05-31 DIAGNOSIS — H3023 Posterior cyclitis, bilateral: Secondary | ICD-10-CM | POA: Diagnosis not present

## 2020-05-31 DIAGNOSIS — H20023 Recurrent acute iridocyclitis, bilateral: Secondary | ICD-10-CM | POA: Diagnosis not present

## 2020-05-31 LAB — HM DIABETES EYE EXAM

## 2020-06-04 DIAGNOSIS — G4733 Obstructive sleep apnea (adult) (pediatric): Secondary | ICD-10-CM | POA: Diagnosis not present

## 2020-06-09 ENCOUNTER — Other Ambulatory Visit: Payer: Self-pay | Admitting: Family Medicine

## 2020-06-24 ENCOUNTER — Ambulatory Visit: Payer: Medicare HMO | Admitting: Internal Medicine

## 2020-06-28 ENCOUNTER — Ambulatory Visit: Payer: Medicare HMO | Admitting: Allergy & Immunology

## 2020-07-02 ENCOUNTER — Telehealth: Payer: Self-pay | Admitting: Family Medicine

## 2020-07-02 DIAGNOSIS — R7989 Other specified abnormal findings of blood chemistry: Secondary | ICD-10-CM

## 2020-07-02 DIAGNOSIS — E1169 Type 2 diabetes mellitus with other specified complication: Secondary | ICD-10-CM

## 2020-07-02 DIAGNOSIS — E1165 Type 2 diabetes mellitus with hyperglycemia: Secondary | ICD-10-CM

## 2020-07-02 DIAGNOSIS — I10 Essential (primary) hypertension: Secondary | ICD-10-CM

## 2020-07-02 NOTE — Telephone Encounter (Signed)
-----   Message from Cloyd Stagers, RT sent at 06/17/2020  1:35 PM EDT ----- Regarding: Lab Orders for Wednesday 6.29.2022 Please place lab orders for Wednesday 6.29.2022, office visit for physical on Wednesday 7.6.2022 Thank you, Dyke Maes RT(R)

## 2020-07-03 ENCOUNTER — Other Ambulatory Visit (INDEPENDENT_AMBULATORY_CARE_PROVIDER_SITE_OTHER): Payer: Medicare HMO

## 2020-07-03 ENCOUNTER — Other Ambulatory Visit: Payer: Self-pay

## 2020-07-03 DIAGNOSIS — E1165 Type 2 diabetes mellitus with hyperglycemia: Secondary | ICD-10-CM

## 2020-07-03 DIAGNOSIS — E785 Hyperlipidemia, unspecified: Secondary | ICD-10-CM

## 2020-07-03 DIAGNOSIS — I10 Essential (primary) hypertension: Secondary | ICD-10-CM

## 2020-07-03 DIAGNOSIS — E1169 Type 2 diabetes mellitus with other specified complication: Secondary | ICD-10-CM | POA: Diagnosis not present

## 2020-07-03 DIAGNOSIS — R7989 Other specified abnormal findings of blood chemistry: Secondary | ICD-10-CM

## 2020-07-03 LAB — COMPREHENSIVE METABOLIC PANEL
ALT: 18 U/L (ref 0–35)
AST: 39 U/L — ABNORMAL HIGH (ref 0–37)
Albumin: 4.2 g/dL (ref 3.5–5.2)
Alkaline Phosphatase: 95 U/L (ref 39–117)
BUN: 12 mg/dL (ref 6–23)
CO2: 27 mEq/L (ref 19–32)
Calcium: 9.8 mg/dL (ref 8.4–10.5)
Chloride: 102 mEq/L (ref 96–112)
Creatinine, Ser: 0.86 mg/dL (ref 0.40–1.20)
GFR: 77.33 mL/min (ref >60.00)
Glucose, Bld: 116 mg/dL — ABNORMAL HIGH (ref 70–99)
Potassium: 4.4 mEq/L (ref 3.5–5.1)
Sodium: 138 mEq/L (ref 135–145)
Total Bilirubin: 0.7 mg/dL (ref 0.2–1.2)
Total Protein: 7.1 g/dL (ref 6.0–8.3)

## 2020-07-03 LAB — HEMOGLOBIN A1C: Hgb A1c MFr Bld: 7.1 % — ABNORMAL HIGH (ref 4.6–6.5)

## 2020-07-03 LAB — CBC WITH DIFFERENTIAL/PLATELET
Basophils Absolute: 0.1 10*3/uL (ref 0.0–0.1)
Basophils Relative: 1 % (ref 0.0–3.0)
Eosinophils Absolute: 0.6 10*3/uL (ref 0.0–0.7)
Eosinophils Relative: 7.5 % — ABNORMAL HIGH (ref 0.0–5.0)
HCT: 39.5 % (ref 36.0–46.0)
Hemoglobin: 13.4 g/dL (ref 12.0–15.0)
Lymphocytes Relative: 40.5 % (ref 12.0–46.0)
Lymphs Abs: 3.2 10*3/uL (ref 0.7–4.0)
MCHC: 33.9 g/dL (ref 30.0–36.0)
MCV: 87.7 fl (ref 78.0–100.0)
Monocytes Absolute: 0.6 10*3/uL (ref 0.1–1.0)
Monocytes Relative: 7.2 % (ref 3.0–12.0)
Neutro Abs: 3.4 10*3/uL (ref 1.4–7.7)
Neutrophils Relative %: 43.8 % (ref 43.0–77.0)
Platelets: 263 10*3/uL (ref 150.0–400.0)
RBC: 4.5 Mil/uL (ref 3.87–5.11)
RDW: 15.7 % — ABNORMAL HIGH (ref 11.5–15.5)
WBC: 7.9 10*3/uL (ref 4.0–10.5)

## 2020-07-03 LAB — LIPID PANEL
Cholesterol: 176 mg/dL (ref 0–200)
HDL: 52.4 mg/dL (ref >39.00)
LDL Cholesterol: 93 mg/dL (ref 0–99)
NonHDL: 123.2
Total CHOL/HDL Ratio: 3
Triglycerides: 153 mg/dL — ABNORMAL HIGH (ref 0.0–149.0)
VLDL: 30.6 mg/dL (ref 0.0–40.0)

## 2020-07-03 LAB — TSH: TSH: 6.82 u[IU]/mL — ABNORMAL HIGH (ref 0.35–4.50)

## 2020-07-03 LAB — T4, FREE: Free T4: 0.82 ng/dL (ref 0.60–1.60)

## 2020-07-04 DIAGNOSIS — G4733 Obstructive sleep apnea (adult) (pediatric): Secondary | ICD-10-CM | POA: Diagnosis not present

## 2020-07-10 ENCOUNTER — Encounter: Payer: Self-pay | Admitting: Family Medicine

## 2020-07-10 ENCOUNTER — Other Ambulatory Visit: Payer: Self-pay

## 2020-07-10 ENCOUNTER — Ambulatory Visit (INDEPENDENT_AMBULATORY_CARE_PROVIDER_SITE_OTHER): Payer: Medicare HMO | Admitting: Family Medicine

## 2020-07-10 VITALS — BP 135/82 | HR 80 | Temp 97.8°F | Ht 66.0 in | Wt 203.4 lb

## 2020-07-10 DIAGNOSIS — E039 Hypothyroidism, unspecified: Secondary | ICD-10-CM | POA: Diagnosis not present

## 2020-07-10 DIAGNOSIS — I10 Essential (primary) hypertension: Secondary | ICD-10-CM

## 2020-07-10 DIAGNOSIS — F418 Other specified anxiety disorders: Secondary | ICD-10-CM | POA: Diagnosis not present

## 2020-07-10 DIAGNOSIS — Z0000 Encounter for general adult medical examination without abnormal findings: Secondary | ICD-10-CM

## 2020-07-10 DIAGNOSIS — E6609 Other obesity due to excess calories: Secondary | ICD-10-CM | POA: Diagnosis not present

## 2020-07-10 DIAGNOSIS — E785 Hyperlipidemia, unspecified: Secondary | ICD-10-CM

## 2020-07-10 DIAGNOSIS — Z6832 Body mass index (BMI) 32.0-32.9, adult: Secondary | ICD-10-CM | POA: Diagnosis not present

## 2020-07-10 DIAGNOSIS — E1165 Type 2 diabetes mellitus with hyperglycemia: Secondary | ICD-10-CM | POA: Diagnosis not present

## 2020-07-10 DIAGNOSIS — R69 Illness, unspecified: Secondary | ICD-10-CM | POA: Diagnosis not present

## 2020-07-10 DIAGNOSIS — Z6830 Body mass index (BMI) 30.0-30.9, adult: Secondary | ICD-10-CM | POA: Insufficient documentation

## 2020-07-10 DIAGNOSIS — E1169 Type 2 diabetes mellitus with other specified complication: Secondary | ICD-10-CM | POA: Diagnosis not present

## 2020-07-10 MED ORDER — ROSUVASTATIN CALCIUM 5 MG PO TABS: 5.0000 mg | ORAL_TABLET | ORAL | 3 refills | Status: DC

## 2020-07-10 MED ORDER — LEVOTHYROXINE SODIUM 25 MCG PO TABS: 25.0000 ug | ORAL_TABLET | Freq: Every day | ORAL | 3 refills | Status: DC

## 2020-07-10 NOTE — Assessment & Plan Note (Signed)
Worse control with bad diet Lab Results  Component Value Date   HGBA1C 7.1 (H) 07/03/2020   Plans to change this Will stop soda  Exercise as tolerated  Metformin 1000 mg bid tolerates well  Taking a statin  microalb is utd

## 2020-07-10 NOTE — Assessment & Plan Note (Signed)
bp in fair control at this time  BP Readings from Last 1 Encounters:  07/10/20 135/82   No changes needed Most recent labs reviewed  Disc lifstyle change with low sodium diet and exercise  Plan to continue coreg 6.25 mg bid and hctz 12.5 mg daily

## 2020-07-10 NOTE — Assessment & Plan Note (Signed)
Discussed how this problem influences overall health and the risks it imposes  Reviewed plan for weight loss with lower calorie diet (via better food choices and also portion control or program like weight watchers) and exercise building up to or more than 30 minutes 5 days per week including some aerobic activity    

## 2020-07-10 NOTE — Assessment & Plan Note (Signed)
Lab Results  Component Value Date   TSH 6.82 (H) 07/03/2020    Plan to tx hypothyroidism Is tired and also depression  Px levothyroxine 25 mcg daily  Disc proper way to take, away from food/supplements and meds Re check at 3 mo f/u

## 2020-07-10 NOTE — Patient Instructions (Addendum)
If you are interested in the shingles vaccine series (Shingrix), call your insurance or pharmacy to check on coverage and location it must be given.  If affordable - you can schedule it here or at your pharmacy depending on coverage   For diabetes Please stop soda Try to get most of your carbohydrates from produce (with the exception of white potatoes)  Eat less bread/pasta/rice/snack foods/cereals/sweets and other items from the middle of the grocery store (processed carbs)  Start levothyroxine 25 mcg daily in am at least 30 minutes before food or vitamins or other medicines   Follow up 3 months

## 2020-07-10 NOTE — Assessment & Plan Note (Signed)
Disc goals for lipids and reasons to control them Rev last labs with pt Rev low sat fat diet in detail Fair control with crestor 5 mg every other day  Does not tolerate it more than that  Diet could improve LDL of 93

## 2020-07-10 NOTE — Assessment & Plan Note (Signed)
Overall doing a little better with wellburrin xl 300 mg daily  Encouraged good self care  Plan to tx hypothyroidism which may also help

## 2020-07-10 NOTE — Progress Notes (Signed)
Subjective:    Patient ID: [name], female    DOB: Sep 18, 1965, 54 y.o.   MRN: [id number]  This visit occurred during the SARS-CoV-2 public health emergency.  Safety protocols were in place, including screening questions prior to the visit, additional usage of staff PPE, and extensive cleaning of exam room while observing appropriate contact time as indicated for disinfecting solutions.    HPI Here for health maintenance exam and to review chronic medical problems    Wt Readings from Last 3 Encounters:  07/10/20 203 lb 7 oz (92.3 kg)  02/27/20 206 lb 5 oz (93.6 kg)  01/10/20 204 lb 7 oz (92.7 kg)   32.84 kg/m  Moved Is getting everything in place- better now  Did some weeding out   Not taking care of herself  Responsible for too much, now after moving things may get better   Covid status-immunized  Zoster status -interested in shingrix  Has had pna vaccine in 2014  Pap 06-hysterectomy   Mammogram 12/21  Self breast exam -no lumps   Colonoscopy 9/20 -10 y recall   HTN bp is stable today  No cp or palpitations or edema - some headache No side effects to medicines  BP Readings from Last 3 Encounters:  07/10/20 (!) 156/98  05/16/20 (!) 157/100  05/13/20 (!) 160/112    Late taking medicine and headache today  Coreg 6.25 mg bid Hctz 12.5 mg daily   Pulse Readings from Last 3 Encounters:  07/10/20 80  05/16/20 94  05/13/20 92    OSA- used cpap  Hyperlipidemia Lab Results  Component Value Date   CHOL 176 07/03/2020   CHOL 151 10/02/2019   CHOL 166 04/15/2017   Lab Results  Component Value Date   HDL 52.40 07/03/2020   HDL 47.00 10/02/2019   HDL 59.10 04/15/2017   Lab Results  Component Value Date   LDLCALC 93 07/03/2020   LDLCALC 79 10/02/2019   LDLCALC 92 04/15/2017   Lab Results  Component Value Date   TRIG 153.0 (H) 07/03/2020   TRIG 125.0 10/02/2019   TRIG 77.0 04/15/2017   Lab Results  Component Value Date   CHOLHDL 3 07/03/2020    CHOLHDL 3 10/02/2019   CHOLHDL 3 04/15/2017   No results found for: LDLDIRECT Crestor 5 mg every other day Tolerates it three times per week  Not eating well   DM2 Lab Results  Component Value Date   HGBA1C 7.1 (H) 07/03/2020  This is up from 6.1 Metformin 1000 mg bid - doing well with that   Diet is horrible lately when moving  Drinking soda -needs to stop  Checks sugar sporatic- not high  123 when she got home today   Now using stairs , walking to care  Exercise is challenging due to pain issues    Eye exam -[facility]  On statin  Lab Results  Component Value Date   MICROALBUR <0.7 10/02/2019    Mood (dep/anx Wellbutrin xl 300 mg  Doing better overall  H/o elevated tsh Is sluggish  Lab Results  Component Value Date   TSH 6.82 (H) 07/03/2020   FT4 of 0.82-low nl    Lab Results  Component Value Date   CREATININE 0.86 07/03/2020   BUN 12 07/03/2020   NA 138 07/03/2020   K 4.4 07/03/2020   CL 102 07/03/2020   CO2 27 07/03/2020   Lab Results  Component Value Date   WBC 7.9 07/03/2020   HGB  13.4 07/03/2020   HCT 39.5 07/03/2020   MCV 87.7 07/03/2020   PLT 263.0 07/03/2020   Lab Results  Component Value Date   ALT 18 07/03/2020   AST 39 (H) 07/03/2020   ALKPHOS 95 07/03/2020   BILITOT 0.7 07/03/2020    Patient Active Problem List   Diagnosis Date Noted   Class 1 obesity due to excess calories with body mass index (BMI) of 32.0 to 32.9 in adult 07/10/2020   ETD (eustachian tube dysfunction) 10/09/2019   Hyperlipidemia associated with type 2 diabetes mellitus ([place]) 07/10/2019   Type 2 diabetes mellitus with hyperglycemia ([place]) 06/06/2019   Exposure to COVID-19 virus 05/17/2019   Peripheral edema 10/30/2017   Moderate persistent asthma, uncomplicated 16/10/9602   Chronic rhinitis 08/10/2017   Cough 06/06/2017   Welcome to Medicare preventive visit 04/20/2017   Chronic low back pain 12/09/2015   Degeneration of lumbar or lumbosacral  intervertebral disc 12/09/2015   Hypothyroid 01/24/2014   Pseudophakia of both eyes 12/15/2013   Nasal septal deviation 01/03/2013   Encounter for Medicare annual wellness exam 11/02/2012   Hydradenitis 11/02/2012   Cystoid macular degeneration of retina 05/13/2012   Macular hole 03/23/2012   Screening-pulmonary TB 03/23/2012   Routine general medical examination at a health care facility 10/02/2011   Iritis, recurrent 10/02/2011   Tachycardia 10/02/2011   ADD (attention deficit disorder) 08/20/2010   DYSHIDROTIC ECZEMA, HANDS 11/12/2009   Obstructive sleep apnea 11/19/2006   Depression with anxiety 09/29/2006   Essential hypertension 09/29/2006   Seasonal and perennial allergic rhinitis 09/29/2006   Asthma, mild intermittent, well-controlled 09/29/2006   GERD 09/29/2006   IBS 09/29/2006   ENDOMETRIOSIS 09/29/2006   FEMALE INFERTILITY 09/29/2006   Fibromyalgia 09/29/2006   INSOMNIA 09/29/2006   Past Medical History:  Diagnosis Date   Allergy    allergic rhinitis   Asthma    Chronic headaches    Degenerative disc disease    Depression    Eczema    Fibromyalgia    Fx of fibula 2009   GERD (gastroesophageal reflux disease)    Hemorrhoids    Hypertension    IBS (irritable bowel syndrome)    Iritis, chronic    Macular hole of right eye    Sleep apnea    Past Surgical History:  Procedure Laterality Date   ABDOMINAL HYSTERECTOMY     ANTERIOR CRUCIATE LIGAMENT REPAIR  1996   small tear of ACL   BREAST EXCISIONAL BIOPSY Left 1995   BREAST SURGERY     left nipple removed - benign tumor   BUNIONECTOMY     COLONOSCOPY     LAPAROSCOPY  08/2004   endometriosis - several    TONSILLECTOMY     Social History   Tobacco Use   Smoking status: Never   Smokeless tobacco: Never  Vaping Use   Vaping Use: Never used  Substance Use Topics   Alcohol use: Never    Alcohol/week: 0.0 standard drinks   Drug use: No   Family History  Problem Relation Age of Onset    Hypertension Mother    Thyroid disease Mother    Fibromyalgia Mother    Colon polyps Mother    Diabetes Father    Kidney cancer Maternal Grandmother    Colon cancer Neg Hx    Esophageal cancer Neg Hx    Stomach cancer Neg Hx    Rectal cancer Neg Hx    Allergic rhinitis Neg Hx    Angioedema Neg Hx  Asthma Neg Hx    Atopy Neg Hx    Eczema Neg Hx    Immunodeficiency Neg Hx    Urticaria Neg Hx    Allergies  Allergen Reactions   Amlodipine     Pedal edema    Azithromycin     Other reaction(s): GI Bleeding   Cetirizine Hcl     REACTION: not effective   Erythromycin     REACTION: stomach hurts   Escitalopram Oxalate     REACTION: no improvement   Lisinopril     REACTION: cough   Loratadine     REACTION: not effective   Nifedipine     REACTION: fatigued and tremor   Sertraline Hcl     REACTION: no help   Vyvanse [Lisdexamfetamine Dimesylate]     Headache and excitablility   Current Outpatient Medications on File Prior to Visit  Medication Sig Dispense Refill   acetaminophen (TYLENOL) 500 MG tablet OTC as directed.     Adalimumab 40 MG/0.8ML PSKT Inject 40 mg into the skin once a week.      albuterol (PROVENTIL) (2.5 MG/3ML) 0.083% nebulizer solution Take 3 mLs (2.5 mg total) by nebulization every 4 (four) hours as needed for wheezing or shortness of breath. 75 mL 1   albuterol (VENTOLIN HFA) 108 (90 Base) MCG/ACT inhaler Inhale 2 puffs into the lungs every 6 (six) hours as needed for wheezing or shortness of breath. 18 g 1   benzonatate (TESSALON) 100 MG capsule Take 1 capsule (100 mg total) by mouth every 8 (eight) hours. 21 capsule 0   budesonide-formoterol (SYMBICORT) 160-4.5 MCG/ACT inhaler Inhale 2 puffs into the lungs 2 (two) times daily. 10.2 g 6   buPROPion (WELLBUTRIN XL) 300 MG 24 hr tablet Take 1 tablet (300 mg total) by mouth daily. 90 tablet 3   carvedilol (COREG) 6.25 MG tablet TAKE 1 TABLET BY MOUTH TWICE DAILY WITH MEALS 180 tablet 0   Cholecalciferol  (VITAMIN D3 PO) Take 1 capsule by mouth daily.     [employer] TRIPLE MAGNESIUM COMPLEX PO Take 1 capsule by mouth daily.     cyclobenzaprine (FLEXERIL) 10 MG tablet Take 1 tablet (10 mg total) by mouth 3 (three) times daily as needed for muscle spasms. Caution of sedation 30 tablet 0   diphenhydrAMINE (BENADRYL) 25 mg capsule Take 25 mg by mouth as needed. OTC as directed.     esomeprazole (NEXIUM) 20 MG capsule Take 1 capsule (20 mg total) by mouth daily at 12 noon. 90 capsule 3   fluconazole (DIFLUCAN) 150 MG tablet Take one pill by mouth every 3 days 3 tablet 0   fluticasone (FLONASE) 50 MCG/ACT nasal spray Place 2 sprays into both nostrils daily.     folic acid (FOLVITE) 1 MG tablet Take 3 mg by mouth 4 (four) times a week.      hydrochlorothiazide (MICROZIDE) 12.5 MG capsule Take 1 capsule by mouth once daily 90 capsule 1   hydrOXYzine (ATARAX/VISTARIL) 10 MG tablet Take 1 tablet (10 mg total) by mouth at bedtime as needed. 15 tablet 0   ketorolac (ACULAR) 0.5 % ophthalmic solution 1 drop 4 (four) times daily.     Lancets (ONETOUCH DELICA PLUS URKYHC62B) MISC SMARTSIG:1 Lancet(s) Topical Twice Daily PRN     leucovorin (WELLCOVORIN) 5 MG tablet Take 5 mg by mouth 2 (two) times a week.     magnesium oxide (MAG-OX) 400 MG tablet Take 400 mg by mouth daily.     metFORMIN (GLUCOPHAGE) 1000 MG  tablet Take 1 tablet (1,000 mg total) by mouth 2 (two) times daily with a meal. 180 tablet 3   Methotrexate 25 MG/ML SOSY Inject 25 mg into the skin once a week.     methotrexate 50 MG/2ML injection Inject 25 mg into the vein once a week.     metroNIDAZOLE (METROGEL VAGINAL) 0.75 % vaginal gel Place 1 Applicatorful vaginally at bedtime. 70 g 0   Multiple Vitamins-Minerals (EMERGEN-C IMMUNE PLUS PO) Take 50 mg by mouth 2 times daily at 12 noon and 4 pm.     nystatin (MYCOSTATIN) 100000 UNIT/ML suspension Take 5 mLs (500,000 Units total) by mouth 3 (three) times daily. Swish and swallow 150 mL 0   ONETOUCH VERIO test  strip USE 1 STRIP TO CHECK GLUCOSE TWICE DAILY AND  AS  NEEDED 100 each 0   prednisoLONE acetate (PRED FORTE) 1 % ophthalmic suspension Place 4 drops into both eyes daily.     promethazine-dextromethorphan (PROMETHAZINE-DM) 6.25-15 MG/5ML syrup Take 5 mLs by mouth at bedtime as needed for cough. 60 mL 0   valACYclovir (VALTREX) 1000 MG tablet Take 1 tablet (1,000 mg total) by mouth 3 (three) times daily. 21 tablet 0   No current facility-administered medications on file prior to visit.     Review of Systems  Constitutional:  Positive for fatigue. Negative for activity change, appetite change, fever and unexpected weight change.  HENT:  Negative for congestion, ear pain, rhinorrhea, sinus pressure and sore throat.   Eyes:  Positive for photophobia. Negative for pain, redness and visual disturbance.  Respiratory:  Negative for cough, shortness of breath and wheezing.   Cardiovascular:  Negative for chest pain and palpitations.  Gastrointestinal:  Negative for abdominal pain, blood in stool, constipation and diarrhea.  Endocrine: Negative for polydipsia and polyuria.  Genitourinary:  Negative for dysuria, frequency and urgency.  Musculoskeletal:  Negative for arthralgias, back pain and myalgias.  Skin:  Negative for pallor and rash.  Allergic/Immunologic: Negative for environmental allergies.  Neurological:  Negative for dizziness, syncope and headaches.  Hematological:  Negative for adenopathy. Does not bruise/bleed easily.  Psychiatric/Behavioral:  Positive for dysphoric mood. Negative for decreased concentration. The patient is not nervous/anxious.       Objective:   Physical Exam Constitutional:      General: She is not in acute distress.    Appearance: Normal appearance. She is well-developed. She is obese. She is not ill-appearing or diaphoretic.  HENT:     Head: Normocephalic and atraumatic.     Right Ear: Tympanic membrane, ear canal and external ear normal.     Left Ear:  Tympanic membrane, ear canal and external ear normal.     Nose: Nose normal. No congestion.     Mouth/Throat:     Mouth: Mucous membranes are moist.     Pharynx: Oropharynx is clear. No posterior oropharyngeal erythema.  Eyes:     General: No scleral icterus.    Extraocular Movements: Extraocular movements intact.     Conjunctiva/sclera: Conjunctivae normal.     Pupils: Pupils are equal, round, and reactive to light.  Neck:     Thyroid: No thyromegaly.     Vascular: No carotid bruit or JVD.  Cardiovascular:     Rate and Rhythm: Normal rate and regular rhythm.     Pulses: Normal pulses.     Heart sounds: Normal heart sounds.    No gallop.  Pulmonary:     Effort: Pulmonary effort is normal. No respiratory distress.  Breath sounds: Normal breath sounds. No wheezing.     Comments: Good air exch Chest:     Chest wall: No tenderness.  Abdominal:     General: Bowel sounds are normal. There is no distension or abdominal bruit.     Palpations: Abdomen is soft. There is no mass.     Tenderness: There is no abdominal tenderness.     Hernia: No hernia is present.  Genitourinary:    Comments: Breast exam: No mass, nodules, thickening, tenderness, bulging, retraction, inflamation, nipple discharge or skin changes noted.  No axillary or clavicular LA.     Baseline scarring L nipple Musculoskeletal:        General: No tenderness. Normal range of motion.     Cervical back: Normal range of motion and neck supple. No rigidity. No muscular tenderness.     Right lower leg: No edema.     Left lower leg: No edema.  Lymphadenopathy:     Cervical: No cervical adenopathy.  Skin:    General: Skin is warm and dry.     Coloration: Skin is not pale.     Findings: No erythema or rash.     Comments: Solar lentigines diffusely Fair complexion   Neurological:     Mental Status: She is alert. Mental status is at baseline.     Cranial Nerves: No cranial nerve deficit.     Motor: No abnormal muscle  tone.     Coordination: Coordination normal.     Gait: Gait normal.     Deep Tendon Reflexes: Reflexes are normal and symmetric. Reflexes normal.  Psychiatric:        Mood and Affect: Mood normal.        Cognition and Memory: Cognition and memory normal.          Assessment & Plan:   Problem List Items Addressed This Visit       Cardiovascular and Mediastinum   Essential hypertension    bp in fair control at this time  BP Readings from Last 1 Encounters:  07/10/20 135/82  No changes needed Most recent labs reviewed  Disc lifstyle change with low sodium diet and exercise  Plan to continue coreg 6.25 mg bid and hctz 12.5 mg daily         Relevant Medications   rosuvastatin (CRESTOR) 5 MG tablet     Endocrine   Hypothyroid    Lab Results  Component Value Date   TSH 6.82 (H) 07/03/2020    Plan to tx hypothyroidism Is tired and also depression  Px levothyroxine 25 mcg daily  Disc proper way to take, away from food/supplements and meds Re check at 3 mo f/u       Relevant Medications   levothyroxine (SYNTHROID) 25 MCG tablet   Type 2 diabetes mellitus with hyperglycemia ([place])    Worse control with bad diet Lab Results  Component Value Date   HGBA1C 7.1 (H) 07/03/2020   Plans to change this Will stop soda  Exercise as tolerated  Metformin 1000 mg bid tolerates well  Taking a statin  microalb is utd        Relevant Medications   rosuvastatin (CRESTOR) 5 MG tablet   Hyperlipidemia associated with type 2 diabetes mellitus ([place])    Disc goals for lipids and reasons to control them Rev last labs with pt Rev low sat fat diet in detail Fair control with crestor 5 mg every other day  Does not tolerate it more than that  Diet could improve LDL of 93       Relevant Medications   rosuvastatin (CRESTOR) 5 MG tablet     Other   Depression with anxiety    Overall doing a little better with wellburrin xl 300 mg daily  Encouraged good self care  Plan to  tx hypothyroidism which may also help       Routine general medical examination at a health care facility - [facility]    Reviewed health habits including diet and exercise and skin cancer prevention Reviewed appropriate screening tests for age  Also reviewed health mt list, fam hx and immunization status , as well as social and family history   See hpi Labs reviewed  covid immunized  Mammogram and colonoscopy utd  Eye exam utd Interested in shingrix and plans to check on coverage       Class 1 obesity due to excess calories with body mass index (BMI) of 32.0 to 32.9 in adult    Discussed how this problem influences overall health and the risks it imposes  Reviewed plan for weight loss with lower calorie diet (via better food choices and also portion control or program like weight watchers) and exercise building up to or more than 30 minutes 5 days per week including some aerobic activity

## 2020-07-10 NOTE — Assessment & Plan Note (Signed)
Reviewed health habits including diet and exercise and skin cancer prevention Reviewed appropriate screening tests for age  Also reviewed health mt list, fam hx and immunization status , as well as social and family history   See hpi Labs reviewed  covid immunized  Mammogram and colonoscopy utd  Eye exam utd Interested in shingrix and plans to check on coverage

## 2020-07-15 ENCOUNTER — Ambulatory Visit
Admission: EM | Admit: 2020-07-15 | Discharge: 2020-07-15 | Disposition: A | Discharge status: Home / Self Care | Payer: Medicare HMO | Attending: Family Medicine | Admitting: Family Medicine

## 2020-07-15 ENCOUNTER — Ambulatory Visit (INDEPENDENT_AMBULATORY_CARE_PROVIDER_SITE_OTHER): Payer: Medicare HMO

## 2020-07-15 ENCOUNTER — Encounter: Payer: Self-pay | Admitting: Emergency Medicine

## 2020-07-15 DIAGNOSIS — W19XXXA Unspecified fall, initial encounter: Secondary | ICD-10-CM | POA: Diagnosis not present

## 2020-07-15 DIAGNOSIS — S63633A Sprain of interphalangeal joint of left middle finger, initial encounter: Secondary | ICD-10-CM | POA: Diagnosis not present

## 2020-07-15 DIAGNOSIS — S60032A Contusion of left middle finger without damage to nail, initial encounter: Secondary | ICD-10-CM | POA: Diagnosis not present

## 2020-07-15 DIAGNOSIS — M79645 Pain in left finger(s): Secondary | ICD-10-CM | POA: Diagnosis not present

## 2020-07-15 MED ORDER — NAPROXEN 375 MG PO TABS: 375.0000 mg | ORAL_TABLET | Freq: Two times a day (BID) | ORAL | 0 refills | Status: DC | PRN

## 2020-07-15 NOTE — ED Provider Notes (Signed)
[facility]    CSN: [id number] Arrival date & time: 07/15/20  0806      History   Chief Complaint No chief complaint on file.   HPI [name] is a 55 y.o. female.   HPI Patient presents today for evaluation of left finger injury.  Patient reports she was stooping down and fell over injuring her left middle finger.  The PIP joint is swollen and bruised.  Patient is unable to completely extend her finger.  She has been taken ibuprofen Tylenol combination tablet without relief.  She also reports applying ice yesterday.  She is in today for imaging to rule out a possible fracture.  Past Medical History:  Diagnosis Date   Allergy    allergic rhinitis   Asthma    Chronic headaches    Degenerative disc disease    Depression    Eczema    Fibromyalgia    Fx of fibula 2009   GERD (gastroesophageal reflux disease)    Hemorrhoids    Hypertension    IBS (irritable bowel syndrome)    Iritis, chronic    Macular hole of right eye    Sleep apnea     Patient Active Problem List   Diagnosis Date Noted   Class 1 obesity due to excess calories with body mass index (BMI) of 32.0 to 32.9 in adult 07/10/2020   ETD (eustachian tube dysfunction) 10/09/2019   Hyperlipidemia associated with type 2 diabetes mellitus ([place]) 07/10/2019   Type 2 diabetes mellitus with hyperglycemia ([place]) 06/06/2019   Exposure to COVID-19 virus 05/17/2019   Peripheral edema 10/30/2017   Moderate persistent asthma, uncomplicated 85/63/1497   Chronic rhinitis 08/10/2017   Cough 06/06/2017   Welcome to Medicare preventive visit 04/20/2017   Chronic low back pain 12/09/2015   Degeneration of lumbar or lumbosacral intervertebral disc 12/09/2015   Hypothyroid 01/24/2014   Pseudophakia of both eyes 12/15/2013   Nasal septal deviation 01/03/2013   Encounter for Medicare annual wellness exam 11/02/2012   Hydradenitis 11/02/2012   Cystoid macular degeneration of retina 05/13/2012   Macular hole  03/23/2012   Screening-pulmonary TB 03/23/2012   Routine general medical examination at a health care facility 10/02/2011   Iritis, recurrent 10/02/2011   Tachycardia 10/02/2011   ADD (attention deficit disorder) 08/20/2010   DYSHIDROTIC ECZEMA, HANDS 11/12/2009   Obstructive sleep apnea 11/19/2006   Depression with anxiety 09/29/2006   Essential hypertension 09/29/2006   Seasonal and perennial allergic rhinitis 09/29/2006   Asthma, mild intermittent, well-controlled 09/29/2006   GERD 09/29/2006   IBS 09/29/2006   ENDOMETRIOSIS 09/29/2006   FEMALE INFERTILITY 09/29/2006   Fibromyalgia 09/29/2006   INSOMNIA 09/29/2006    Past Surgical History:  Procedure Laterality Date   ABDOMINAL HYSTERECTOMY     ANTERIOR CRUCIATE LIGAMENT REPAIR  1996   small tear of ACL   BREAST EXCISIONAL BIOPSY Left 1995   BREAST SURGERY     left nipple removed - benign tumor   BUNIONECTOMY     COLONOSCOPY     LAPAROSCOPY  08/2004   endometriosis - several    TONSILLECTOMY      OB History   No obstetric history on file.      Home Medications    Prior to Admission medications   Medication Sig Start Date End Date Taking? Authorizing Provider  naproxen (NAPROSYN) 375 MG tablet Take 1 tablet (375 mg total) by mouth 2 (two) times daily between meals as needed for moderate pain. 07/15/20  Yes  Scot Jun, FNP  acetaminophen (TYLENOL) 500 MG tablet OTC as directed.    [provider]  Adalimumab 40 MG/0.8ML PSKT Inject 40 mg into the skin once a week.     [provider]  albuterol (PROVENTIL) (2.5 MG/3ML) 0.083% nebulizer solution Take 3 mLs (2.5 mg total) by nebulization every 4 (four) hours as needed for wheezing or shortness of breath. 09/22/19   Valentina Shaggy, MD  albuterol (VENTOLIN HFA) 108 (90 Base) MCG/ACT inhaler Inhale 2 puffs into the lungs every 6 (six) hours as needed for wheezing or shortness of breath. 09/22/19   Valentina Shaggy, MD  benzonatate  (TESSALON) 100 MG capsule Take 1 capsule (100 mg total) by mouth every 8 (eight) hours. 05/13/20   Faustino Congress, NP  budesonide-formoterol [facility]) 160-4.5 MCG/ACT inhaler Inhale 2 puffs into the lungs 2 (two) times daily. 09/22/19   Valentina Shaggy, MD  buPROPion (WELLBUTRIN XL) 300 MG 24 hr tablet Take 1 tablet (300 mg total) by mouth daily. 02/27/20   Tower, Wynelle Fanny, MD  carvedilol (COREG) 6.25 MG tablet TAKE 1 TABLET BY MOUTH TWICE DAILY WITH MEALS 06/11/20   Tower, Wynelle Fanny, MD  Cholecalciferol (VITAMIN D3 PO) Take 1 capsule by mouth daily.    [provider]  [employer] TRIPLE MAGNESIUM COMPLEX PO Take 1 capsule by mouth daily.    [provider]  cyclobenzaprine (FLEXERIL) 10 MG tablet Take 1 tablet (10 mg total) by mouth 3 (three) times daily as needed for muscle spasms. Caution of sedation 11/16/18   Tower, Roque Lias A, MD  diphenhydrAMINE (BENADRYL) 25 mg capsule Take 25 mg by mouth as needed. OTC as directed.    [provider]  esomeprazole (NEXIUM) 20 MG capsule Take 1 capsule (20 mg total) by mouth daily at 12 noon. 04/22/16   Tower, Wynelle Fanny, MD  fluconazole (DIFLUCAN) 150 MG tablet Take one pill by mouth every 3 days 06/01/19   Tower, Wynelle Fanny, MD  fluticasone [facility]) 50 MCG/ACT nasal spray Place 2 sprays into both nostrils daily.    [provider]  folic acid (FOLVITE) 1 MG tablet Take 3 mg by mouth 4 (four) times a week.     [provider]  hydrochlorothiazide (MICROZIDE) 12.5 MG capsule Take 1 capsule by mouth once daily 04/03/20   Tower, Wynelle Fanny, MD  hydrOXYzine (ATARAX/VISTARIL) 10 MG tablet Take 1 tablet (10 mg total) by mouth at bedtime as needed. 11/20/19   Tower, Wynelle Fanny, MD  ketorolac (ACULAR) 0.5 % ophthalmic solution 1 drop 4 (four) times daily. 11/24/18   [provider]  Lancets (ONETOUCH DELICA PLUS [id number]) [place] SMARTSIG:1 Lancet(s) Topical Twice Daily PRN 08/22/19   [provider]  leucovorin (WELLCOVORIN)  5 MG tablet Take 5 mg by mouth 2 (two) times a week.    [provider]  levothyroxine (SYNTHROID) 25 MCG tablet Take 1 tablet (25 mcg total) by mouth daily. 07/10/20   Tower, Wynelle Fanny, MD  magnesium oxide (MAG-OX) 400 MG tablet Take 400 mg by mouth daily.    [provider]  metFORMIN (GLUCOPHAGE) 1000 MG tablet Take 1 tablet (1,000 mg total) by mouth 2 (two) times daily with a meal. 10/09/19   Tower, Wynelle Fanny, MD  Methotrexate 25 MG/ML SOSY Inject 25 mg into the skin once a week.    [provider]  methotrexate 50 MG/2ML injection Inject 25 mg into the vein once a week.    [provider]  metroNIDAZOLE (METROGEL VAGINAL) 0.75 % vaginal gel Place 1 Applicatorful vaginally at bedtime. 06/01/19   Tower, Wynelle Fanny, MD  Multiple Vitamins-Minerals (EMERGEN-C IMMUNE PLUS PO) Take 50 mg by mouth 2 times daily at 12 noon and 4 pm.    [provider]  nystatin (MYCOSTATIN) 100000 UNIT/ML suspension Take 5 mLs (500,000 Units total) by mouth 3 (three) times daily. Swish and swallow 05/17/19   Tower, Wynelle Fanny, MD  [facility] VERIO test strip USE 1 STRIP TO CHECK GLUCOSE TWICE DAILY AND  AS  NEEDED 12/05/19   Tower, Wynelle Fanny, MD  prednisoLONE acetate (PRED FORTE) 1 % ophthalmic suspension Place 4 drops into both eyes daily.    [provider]  promethazine-dextromethorphan (PROMETHAZINE-DM) 6.25-15 MG/5ML syrup Take 5 mLs by mouth at bedtime as needed for cough. 05/16/20   Wurst, [place], PA-C  rosuvastatin (CRESTOR) 5 MG tablet Take 1 tablet (5 mg total) by mouth every other day. 07/10/20   Tower, Wynelle Fanny, MD  valACYclovir (VALTREX) 1000 MG tablet Take 1 tablet (1,000 mg total) by mouth 3 (three) times daily. 11/20/19   Tower, Wynelle Fanny, MD    Family History Family History  Problem Relation Age of Onset   Hypertension Mother    Thyroid disease Mother    Fibromyalgia Mother    Colon polyps Mother    Diabetes Father    Kidney cancer Maternal Grandmother    Colon  cancer Neg Hx    Esophageal cancer Neg Hx    Stomach cancer Neg Hx    Rectal cancer Neg Hx    Allergic rhinitis Neg Hx    Angioedema Neg Hx    Asthma Neg Hx    Atopy Neg Hx    Eczema Neg Hx    Immunodeficiency Neg Hx    Urticaria Neg Hx     Social History Social History   Tobacco Use   Smoking status: Never   Smokeless tobacco: Never  Vaping Use   Vaping Use: Never used  Substance Use Topics   Alcohol use: Never    Alcohol/week: 0.0 standard drinks   Drug use: No     Allergies   Amlodipine, Azithromycin, Cetirizine hcl, Erythromycin, Escitalopram oxalate, Lisinopril, Loratadine, Nifedipine, Sertraline hcl, and Vyvanse [lisdexamfetamine dimesylate]   Review of Systems Review of Systems   Physical Exam Triage Vital Signs ED Triage Vitals  Enc Vitals Group     BP 07/15/20 0815 (!) 166/102     Pulse Rate 07/15/20 0815 88     Resp 07/15/20 0815 16     Temp 07/15/20 0815 97.6 F (36.4 C)     Temp Source 07/15/20 0815 Temporal     SpO2 07/15/20 0815 95 %     Weight --      Height --      Head Circumference --      Peak Flow --      Pain Score 07/15/20 0817 7     Pain Loc --      Pain Edu? --      Excl. in [place]? --    No data found.  Updated Vital Signs BP (!) 166/102 (BP Location: Right Arm)   Pulse 88   Temp 97.6 F (36.4 C) (Temporal)   Resp 16   LMP 08/05/2006   SpO2 95%   Visual Acuity Right Eye Distance:   Left Eye Distance:   Bilateral Distance:    Right Eye Near:   Left Eye Near:    Bilateral Near:  Physical Exam Cardiovascular:     Rate and Rhythm: Normal rate and regular rhythm.  Pulmonary:     Effort: Pulmonary effort is normal.     Breath sounds: Normal breath sounds.  Musculoskeletal:     Left hand: Bony tenderness present. Decreased strength.     Comments: Left middle finger ecchymosis and swelling at the PIP joint limited range of motion no deformity or misalignment.  Skin:    Capillary Refill: Capillary refill takes less  than 2 seconds.  Neurological:     General: No focal deficit present.     Mental Status: She is alert.  Psychiatric:        Mood and Affect: Mood normal.        Behavior: Behavior normal.        Thought Content: Thought content normal.        Judgment: Judgment normal.       UC Treatments / Results  Labs (all labs ordered are listed, but only abnormal results are displayed) Labs Reviewed - No data to display  EKG   Radiology DG Finger Middle Left  Result Date: 07/15/2020 CLINICAL DATA:  Fall, pain and bruising EXAM: LEFT MIDDLE FINGER 2+V COMPARISON:  None. FINDINGS: There is no evidence of fracture or dislocation. There is no evidence of arthropathy or other focal bone abnormality. Soft tissues are unremarkable. IMPRESSION: No fracture or dislocation of the left third digit. Joint spaces are preserved. Electronically Signed   By: Eddie Candle M.D.   On: 07/15/2020 08:29    Procedures Procedures (including critical care time)  Medications Ordered in UC Medications - No data to display  Initial Impression / Assessment and Plan / UC Course  I have reviewed the triage vital signs and the nursing notes.  Pertinent labs & imaging results that were available during my care of the patient were reviewed by me and considered in my medical decision making (see chart for details).    Left middle digit sprain resulting from a fall.  Temporary splint placed patient can continue splint for comfort as needed.  Recommended naproxen twice daily for at least the next 7 days to reduce swelling and pain.  Also encouraged to continue icing joint twice daily.  Follow-up with orthopedic if symptoms worsen or do not improve. Final Clinical Impressions(s) / UC Diagnoses   Final diagnoses:  Sprain of interphalangeal joint of left middle finger, initial encounter     Discharge Instructions      Wears finger splint for comfort until swelling and pain inability to move finger improves.  Take  naproxen twice daily for the next 7 days this will reduce inflammation, pain and swelling.  Recommend ice and at least once or twice a day to also help reduce swelling.  If symptoms worsen or do not improve I have included information to follow-up with orthopedic specialty.   ED Prescriptions     Medication Sig Dispense Auth. Provider   naproxen (NAPROSYN) 375 MG tablet Take 1 tablet (375 mg total) by mouth 2 (two) times daily between meals as needed for moderate pain. 10 tablet Scot Jun, FNP      PDMP not reviewed this encounter.   Scot Jun, [place] 07/15/20 [phone]

## 2020-07-15 NOTE — ED Triage Notes (Signed)
[place] forward on left middle finger.  Finger bruised and hurts and states she is unable to straighten finger

## 2020-07-15 NOTE — Discharge Instructions (Addendum)
Wears finger splint for comfort until swelling and pain inability to move finger improves.  Take naproxen twice daily for the next 7 days this will reduce inflammation, pain and swelling.  Recommend ice and at least once or twice a day to also help reduce swelling.  If symptoms worsen or do not improve I have included information to follow-up with orthopedic specialty.

## 2020-08-04 DIAGNOSIS — G4733 Obstructive sleep apnea (adult) (pediatric): Secondary | ICD-10-CM | POA: Diagnosis not present

## 2020-08-23 ENCOUNTER — Encounter: Payer: Self-pay | Admitting: Allergy & Immunology

## 2020-08-23 ENCOUNTER — Other Ambulatory Visit: Payer: Self-pay

## 2020-08-23 ENCOUNTER — Ambulatory Visit: Payer: Medicare HMO | Admitting: Allergy & Immunology

## 2020-08-23 VITALS — BP 140/92 | HR 88 | Temp 98.3°F | Resp 16 | Ht 68.0 in | Wt 207.2 lb

## 2020-08-23 DIAGNOSIS — J310 Chronic rhinitis: Secondary | ICD-10-CM | POA: Diagnosis not present

## 2020-08-23 DIAGNOSIS — J4540 Moderate persistent asthma, uncomplicated: Secondary | ICD-10-CM | POA: Diagnosis not present

## 2020-08-23 MED ORDER — ALBUTEROL SULFATE HFA 108 (90 BASE) MCG/ACT IN AERS: 2.0000 | INHALATION_SPRAY | Freq: Four times a day (QID) | RESPIRATORY_TRACT | 1 refills | Status: DC | PRN

## 2020-08-23 MED ORDER — SYMBICORT 160-4.5 MCG/ACT IN AERO: 2.0000 | INHALATION_SPRAY | Freq: Two times a day (BID) | RESPIRATORY_TRACT | 5 refills | Status: DC

## 2020-08-23 NOTE — Progress Notes (Signed)
FOLLOW UP  Date of Service/Encounter:  08/23/20   Assessment:   Moderate persistent asthma, uncomplicated   Chronic rhinitis - s/p 2 rounds of allergen immunotherapy    Complicated past medical history including fibromyalgia, uveitis, and panniculitis   Immunosuppressed state (methotrexate and Humira)   Type 2 diabetes    Asthma Reportables:  Severity: moderate persistent  Risk: high Control: not well controlled  Plan/Recommendations:   1. Moderate persistent asthma, uncomplicated - Lung testing looks lower today, but we will just trend that over time. - We will send in the Ventolin instead of the ProAir.  - Daily controller medication(s): NONE - Prior to physical activity: ProAir 2 puffs 10-15 minutes before physical activity. - Rescue medications: ProAir 4 puffs every 4-6 hours as needed or albuterol nebulizer one vial every 4-6 hours as needed  - During period of respiratory distress: start Symbicort 160/4.23mg two puffs twice daily with spacer for 1-2 weeks - Asthma control goals:  * Full participation in all desired activities (may need albuterol before activity) * Albuterol use two time or less a week on average (not counting use with activity) * Cough interfering with sleep two time or less a month * Oral steroids no more than once a year * No hospitalizations  2. Chronic rhinitis  - Continue with levocetirizine '5mg'$  daily as needed - Continue with fluticasone 1-2 sprays per nostril daily as needed.   3. Return in about 6 months (around 02/23/2021).   Subjective:   [name] KASEYis a 55y.o. female presenting today for follow up of  Chief Complaint  Patient presents with   Asthma    [name] PETROWhas a history of the following: Patient Active Problem List   Diagnosis Date Noted   Class 1 obesity due to excess calories with body mass index (BMI) of 32.0 to 32.9 in adult 07/10/2020   ETD (eustachian tube dysfunction) 10/09/2019   Hyperlipidemia  associated with type 2 diabetes mellitus ([place] 07/10/2019   Type 2 diabetes mellitus with hyperglycemia ([place] 06/06/2019   Exposure to COVID-19 virus 05/17/2019   Peripheral edema 10/30/2017   Moderate persistent asthma, uncomplicated [id number]  Chronic rhinitis 08/10/2017   Cough 06/06/2017   Welcome to Medicare preventive visit 04/20/2017   Chronic low back pain 12/09/2015   Degeneration of lumbar or lumbosacral intervertebral disc 12/09/2015   Hypothyroid 01/24/2014   Pseudophakia of both eyes 12/15/2013   Nasal septal deviation 01/03/2013   Encounter for Medicare annual wellness exam 11/02/2012   Hydradenitis 11/02/2012   Cystoid macular degeneration of retina 05/13/2012   Macular hole 03/23/2012   Screening-pulmonary TB 03/23/2012   Routine general medical examination at a health care facility 10/02/2011   Iritis, recurrent 10/02/2011   Tachycardia 10/02/2011   ADD (attention deficit disorder) 08/20/2010   DYSHIDROTIC ECZEMA, HANDS 11/12/2009   Obstructive sleep apnea 11/19/2006   Depression with anxiety 09/29/2006   Essential hypertension 09/29/2006   Seasonal and perennial allergic rhinitis 09/29/2006   Asthma, mild intermittent, well-controlled 09/29/2006   GERD 09/29/2006   IBS 09/29/2006   ENDOMETRIOSIS 09/29/2006   FEMALE INFERTILITY 09/29/2006   Fibromyalgia 09/29/2006   INSOMNIA 09/29/2006    History obtained from: chart review and patient. 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 female presenting for a follow up visit.  She was last seen in September 2021.  At that time, her lung testing looked great.  We continue with albuterol as  needed.  She was no longer on a controller medication.  We made her Symbicort to be used as needed 2 puffs twice daily.  For her rhinitis, would continue with levocetirizine as well as fluticasone.  Since the last visit, she has done well. 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  Asthma/Respiratory Symptom History: She has been doing well overall. Fall and winter are the worst times of the year. She has been using the Symbicort a couple of time per week. She has only refilled her Symbicort once since I saw her last time. She does use a spacer with this. She is not coughing at night. She does not wake up refreshed but this is not from breathing. She has not needed prednisone for her breathing. She does not remember the last time that she needed the steroids at all. She has been using using her rescue inhaler around once per week.  She has been on ProAir, but she feels that the Ventolin works better. She had some insurance changes and had to switch to [employer].   Allergic Rhinitis Symptom History: She remains off of the levocetirizine. She is not using her nose spray at all. She has not had sinus infections at all. Overall she is doing well.   She is on disability for her fibromyalgia.   Otherwise, there have been no changes to her past medical history, surgical history, family history, or social history.    Review of Systems  Constitutional: Negative.  Negative for chills, fever, malaise/fatigue and weight loss.  HENT:  Negative for congestion, ear discharge and ear pain.   Eyes:  Negative for pain, discharge and redness.  Respiratory:  Positive for cough. Negative for sputum production, shortness of breath and wheezing.   Cardiovascular: Negative.  Negative for chest pain and palpitations.  Gastrointestinal:  Negative for abdominal pain, constipation, diarrhea, heartburn, nausea and vomiting.  Skin: Negative.  Negative for itching and rash.  Neurological:  Negative for dizziness and headaches.  Endo/Heme/Allergies:  Positive for environmental allergies. Does not bruise/bleed easily.      Objective:   Blood pressure (!) 140/92, pulse 88, temperature 98.3 F (36.8 C), temperature source Temporal, resp. rate 16, height '5\' 8"'$  (1.727  m), weight 207 lb 3.2 oz (94 kg), last menstrual period 08/05/2006, SpO2 96 %. Body mass index is 31.5 kg/m.   Physical Exam:  Physical Exam Vitals reviewed.  Constitutional:      Appearance: She is well-developed.  HENT:     Head: Normocephalic and atraumatic.     Right Ear: Tympanic membrane, ear canal and external ear normal.     Left Ear: Tympanic membrane, ear canal and external ear normal.     Nose: Mucosal edema and rhinorrhea present. No nasal deformity or septal deviation.     Right Turbinates: Enlarged. Not swollen.     Left Turbinates: Enlarged. Not swollen.     Right Sinus: No maxillary sinus tenderness or frontal sinus tenderness.     Left Sinus: No maxillary sinus tenderness or frontal sinus tenderness.     Mouth/Throat:     Mouth: Mucous membranes are not pale and not dry.     Pharynx: Uvula midline.  Eyes:     General: Lids are normal. No allergic shiner.       Right eye: No discharge.        Left eye: No discharge.     Conjunctiva/sclera: Conjunctivae normal.  Right eye: Right conjunctiva is not injected. No chemosis.    Left eye: Left conjunctiva is not injected. No chemosis.    Pupils: Pupils are equal, round, and reactive to light.  Cardiovascular:     Rate and Rhythm: Normal rate and regular rhythm.     Heart sounds: Normal heart sounds.  Pulmonary:     Effort: Pulmonary effort is normal. No tachypnea, accessory muscle usage or respiratory distress.     Breath sounds: Normal breath sounds. No wheezing, rhonchi or rales.  Chest:     Chest wall: No tenderness.  Lymphadenopathy:     Cervical: No cervical adenopathy.  Skin:    General: Skin is warm.     Capillary Refill: Capillary refill takes less than 2 seconds.     Coloration: Skin is not pale.     Findings: No abrasion, erythema, petechiae or rash. Rash is not papular, urticarial or vesicular.  Neurological:     Mental Status: She is alert.  Psychiatric:        Behavior: Behavior is  cooperative.     Diagnostic studies:   Spirometry: results normal (FEV1: 2.03/67%, FVC: 2.65/69%, FEV1/FVC: 77%).    Spirometry consistent with possible restrictive disease.   Allergy Studies: none        Salvatore Marvel, MD  [facility] [place]

## 2020-08-23 NOTE — Patient Instructions (Addendum)
1. Moderate persistent asthma, uncomplicated - Lung testing looks lower today, but we will just trend that over time. - We will send in the Ventolin instead of the ProAir.  - Daily controller medication(s): NONE - Prior to physical activity: ProAir 2 puffs 10-15 minutes before physical activity. - Rescue medications: ProAir 4 puffs every 4-6 hours as needed or albuterol nebulizer one vial every 4-6 hours as needed  - During period of respiratory distress: start Symbicort 160/4.17mg two puffs twice daily with spacer for 1-2 weeks - Asthma control goals:  * Full participation in all desired activities (may need albuterol before activity) * Albuterol use two time or less a week on average (not counting use with activity) * Cough interfering with sleep two time or less a month * Oral steroids no more than once a year * No hospitalizations  2. Chronic rhinitis  - Continue with levocetirizine '5mg'$  daily as needed - Continue with fluticasone 1-2 sprays per nostril daily as needed.   3. Return in about 6 months (around 02/23/2021).    Please inform [place] any Emergency Department visits, hospitalizations, or changes in symptoms. Call [place] going to the ED for breathing or allergy symptoms since we might be able to fit you in for a sick visit. Feel free to contact [place]anytime with any questions, problems, or concerns.  It was a pleasure to see you again today!  Websites that have reliable patient information: 1. American Academy of Asthma, Allergy, and Immunology: www.aaaai.org 2. Food Allergy Research and Education (FARE): foodallergy.org 3. Mothers of Asthmatics: http://www.asthmacommunitynetwork.org 4. American College of Allergy, Asthma, and Immunology: www.acaai.org   COVID-19 Vaccine Information can be found at: [email or web address] questions related to vaccine distribution or appointments, please email [email or web address]  or call [phone]     "Like" [facility] Facebook and Instagram for our latest updates!        Make sure you are registered to vote! If you have moved or changed any of your contact information, you will need to get this updated before voting!  In some cases, you MAY be able to register to vote online: [email or web address]

## 2020-08-27 DIAGNOSIS — G4733 Obstructive sleep apnea (adult) (pediatric): Secondary | ICD-10-CM | POA: Diagnosis not present
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
## 2020-09-02 ENCOUNTER — Other Ambulatory Visit: Payer: Self-pay

## 2020-09-02 ENCOUNTER — Ambulatory Visit: Payer: Medicare HMO | Admitting: Internal Medicine

## 2020-09-02 ENCOUNTER — Encounter: Payer: Self-pay | Admitting: Internal Medicine

## 2020-09-02 DIAGNOSIS — G4733 Obstructive sleep apnea (adult) (pediatric): Secondary | ICD-10-CM | POA: Diagnosis not present

## 2020-09-02 DIAGNOSIS — J4520 Mild intermittent asthma, uncomplicated: Secondary | ICD-10-CM

## 2020-09-02 NOTE — Patient Instructions (Signed)
We can continue CPAP auto 5-15 ° °Please call if we can help °

## 2020-09-15 NOTE — Assessment & Plan Note (Signed)
Spirometry without flow obstruction Plan- managed by her allergist/ PCP

## 2020-09-15 NOTE — Assessment & Plan Note (Signed)
Benefit with god compliance and control Plan- continue auto 5-15

## 2020-10-03 ENCOUNTER — Other Ambulatory Visit: Payer: Self-pay | Admitting: Family Medicine

## 2020-10-04 DIAGNOSIS — H20023 Recurrent acute iridocyclitis, bilateral: Secondary | ICD-10-CM | POA: Diagnosis not present

## 2020-10-04 DIAGNOSIS — Z79899 Other long term (current) drug therapy: Secondary | ICD-10-CM | POA: Diagnosis not present

## 2020-10-04 DIAGNOSIS — H3023 Posterior cyclitis, bilateral: Secondary | ICD-10-CM | POA: Diagnosis not present

## 2020-10-04 DIAGNOSIS — H35353 Cystoid macular degeneration, bilateral: Secondary | ICD-10-CM | POA: Diagnosis not present

## 2020-10-07 ENCOUNTER — Other Ambulatory Visit: Payer: Self-pay

## 2020-10-07 ENCOUNTER — Ambulatory Visit (INDEPENDENT_AMBULATORY_CARE_PROVIDER_SITE_OTHER): Payer: Medicare HMO | Admitting: Family Medicine

## 2020-10-07 ENCOUNTER — Encounter: Payer: Self-pay | Admitting: Family Medicine

## 2020-10-07 VITALS — BP 142/80 | HR 90 | Temp 97.9°F | Ht 68.0 in | Wt 208.0 lb

## 2020-10-07 DIAGNOSIS — M545 Low back pain, unspecified: Secondary | ICD-10-CM

## 2020-10-07 DIAGNOSIS — G8929 Other chronic pain: Secondary | ICD-10-CM | POA: Diagnosis not present

## 2020-10-07 MED ORDER — METHOCARBAMOL 500 MG PO TABS: 500.0000 mg | ORAL_TABLET | Freq: Three times a day (TID) | ORAL | 1 refills | Status: DC | PRN

## 2020-10-07 MED ORDER — NAPROXEN 375 MG PO TABS: 375.0000 mg | ORAL_TABLET | Freq: Two times a day (BID) | ORAL | 1 refills | Status: DC | PRN

## 2020-10-07 MED ORDER — CARVEDILOL 6.25 MG PO TABS: 6.2500 mg | ORAL_TABLET | Freq: Two times a day (BID) | ORAL | 3 refills | Status: DC

## 2020-10-07 NOTE — Patient Instructions (Addendum)
Try some heat and/or ice for 10 minutes at a time   Take naproxen as needed for back pain  Daily as needed with food If GI upset-stop it   For spasm Methocarbamol  up to three times daily caution of sedation   Start working exercises/stretches in handout  Walking if the best thing for back pain

## 2020-10-07 NOTE — Assessment & Plan Note (Signed)
Acute on chronic today and in lower thoracic/upper lumbar area on the L  In setting of known DDD and scoliosis  Exam consistent with spasm No neuro concerns ER parameters discussed Adv use of ice/heat for 10 minutes Continue slow walking  Aleve prn (watch for interaction with mtx) and take with food Methocarbamol prn (caution of sedation) Handouts given with rehab exercises/stretches for TS and LS to start  Consider PT ref if not improving

## 2020-10-07 NOTE — Progress Notes (Signed)
Subjective:    Patient ID: [name], female    DOB: 1965-07-04, 55 y.o.   MRN: [id number]  This visit occurred during the SARS-CoV-2 public health emergency.  Safety protocols were in place, including screening questions prior to the visit, additional usage of staff PPE, and extensive cleaning of exam room while observing appropriate contact time as indicated for disinfecting solutions.   HPI Pt presents for c/o back pain below ribs  Wt Readings from Last 3 Encounters:  10/07/20 208 lb (94.3 kg)  09/02/20 207 lb 12.8 oz (94.3 kg)  08/23/20 207 lb 3.2 oz (94 kg)   31.63 kg/m  She moved in mid June - lifting/push pull  L side  Under ribs in low back  Keeps waking her up when she moves a certain way in bed  It catches her  Sharp in nature - lasts until she changes position   Worsened by leaning from back to turn to either side/then once positioned it improves  She uses a pillow between knees   Walking- ok as long as she does not abruptly turn/twist   No heat or ice  Using advil and tylenol    In the past both lower and upper back pain  This is acute on chronic    History of deg disc dz lumbar, with scoliosis (L2) and also fibromyalgia  In the past used a TENS unit  (she may still have one) but has not tried it yet  Did PT/water therapy in the past   No urinary symptoms   No back exercises or stretches   Patient Active Problem List   Diagnosis Date Noted   Class 1 obesity due to excess calories with body mass index (BMI) of 32.0 to 32.9 in adult 07/10/2020   ETD (eustachian tube dysfunction) 10/09/2019   Hyperlipidemia associated with type 2 diabetes mellitus ([place]) 07/10/2019   Type 2 diabetes mellitus with hyperglycemia ([place]) 06/06/2019   Exposure to COVID-19 virus 05/17/2019   Peripheral edema 10/30/2017   Moderate persistent asthma, uncomplicated 01/07/7251   Chronic rhinitis 08/10/2017   Cough 06/06/2017   Welcome to Medicare preventive visit  04/20/2017   Chronic low back pain 12/09/2015   Degeneration of lumbar or lumbosacral intervertebral disc 12/09/2015   Hypothyroid 01/24/2014   Pseudophakia of both eyes 12/15/2013   Nasal septal deviation 01/03/2013   Encounter for Medicare annual wellness exam 11/02/2012   Hydradenitis 11/02/2012   Cystoid macular degeneration of retina 05/13/2012   Macular hole 03/23/2012   Screening-pulmonary TB 03/23/2012   Routine general medical examination at a health care facility 10/02/2011   Iritis, recurrent 10/02/2011   Tachycardia 10/02/2011   ADD (attention deficit disorder) 08/20/2010   DYSHIDROTIC ECZEMA, HANDS 11/12/2009   Obstructive sleep apnea 11/19/2006   Depression with anxiety 09/29/2006   Essential hypertension 09/29/2006   Seasonal and perennial allergic rhinitis 09/29/2006   Asthma, mild intermittent, well-controlled 09/29/2006   GERD 09/29/2006   IBS 09/29/2006   ENDOMETRIOSIS 09/29/2006   FEMALE INFERTILITY 09/29/2006   Fibromyalgia 09/29/2006   INSOMNIA 09/29/2006   Past Medical History:  Diagnosis Date   Allergy    allergic rhinitis   Asthma    Chronic headaches    Degenerative disc disease    Depression    Eczema    Fibromyalgia    Fx of fibula 2009   GERD (gastroesophageal reflux disease)    Hemorrhoids    Hypertension    IBS (irritable bowel syndrome)    Iritis,  chronic    Macular hole of right eye    Sleep apnea    Past Surgical History:  Procedure Laterality Date   ABDOMINAL HYSTERECTOMY     ANTERIOR CRUCIATE LIGAMENT REPAIR  1996   small tear of ACL   BREAST EXCISIONAL BIOPSY Left 1995   BREAST SURGERY     left nipple removed - benign tumor   BUNIONECTOMY     COLONOSCOPY     LAPAROSCOPY  08/2004   endometriosis - several    TONSILLECTOMY     Social History   Tobacco Use   Smoking status: Never   Smokeless tobacco: Never  Vaping Use   Vaping Use: Never used  Substance Use Topics   Alcohol use: Never    Alcohol/week: 0.0 standard  drinks   Drug use: No   Family History  Problem Relation Age of Onset   Hypertension Mother    Thyroid disease Mother    Fibromyalgia Mother    Colon polyps Mother    Diabetes Father    Kidney cancer Maternal Grandmother    Colon cancer Neg Hx    Esophageal cancer Neg Hx    Stomach cancer Neg Hx    Rectal cancer Neg Hx    Allergic rhinitis Neg Hx    Angioedema Neg Hx    Asthma Neg Hx    Atopy Neg Hx    Eczema Neg Hx    Immunodeficiency Neg Hx    Urticaria Neg Hx    Allergies  Allergen Reactions   Amlodipine     Pedal edema    Azithromycin     Other reaction(s): GI Bleeding   Cetirizine Hcl     REACTION: not effective   Erythromycin     REACTION: stomach hurts   Escitalopram Oxalate     REACTION: no improvement   Lisinopril     REACTION: cough   Loratadine     REACTION: not effective   Nifedipine     REACTION: fatigued and tremor   Sertraline Hcl     REACTION: no help   Vyvanse [Lisdexamfetamine Dimesylate]     Headache and excitablility   Current Outpatient Medications on File Prior to Visit  Medication Sig Dispense Refill   acetaminophen (TYLENOL) 500 MG tablet OTC as directed.     Adalimumab 40 MG/0.8ML PSKT Inject 40 mg into the skin once a week.      albuterol (PROVENTIL) (2.5 MG/3ML) 0.083% nebulizer solution Take 3 mLs (2.5 mg total) by nebulization every 4 (four) hours as needed for wheezing or shortness of breath. 75 mL 1   albuterol (VENTOLIN HFA) 108 (90 Base) MCG/ACT inhaler Inhale 2 puffs into the lungs every 6 (six) hours as needed for wheezing or shortness of breath. 18 g 1   buPROPion (WELLBUTRIN XL) 300 MG 24 hr tablet Take 1 tablet (300 mg total) by mouth daily. 90 tablet 3   Cholecalciferol (VITAMIN D3 PO) Take 1 capsule by mouth daily.     [employer] TRIPLE MAGNESIUM COMPLEX PO Take 1 capsule by mouth daily.     cyclobenzaprine (FLEXERIL) 10 MG tablet Take 1 tablet (10 mg total) by mouth 3 (three) times daily as needed for muscle spasms. Caution of  sedation 30 tablet 0   diphenhydrAMINE (BENADRYL) 25 mg capsule Take 25 mg by mouth as needed. OTC as directed.     esomeprazole (NEXIUM) 20 MG capsule Take 1 capsule (20 mg total) by mouth daily at 12 noon. 90 capsule 3  fluticasone (FLONASE) 50 MCG/ACT nasal spray Place 2 sprays into both nostrils daily.     folic acid (FOLVITE) 1 MG tablet Take 3 mg by mouth 4 (four) times a week.      hydrochlorothiazide (MICROZIDE) 12.5 MG capsule Take 1 capsule by mouth once daily 90 capsule 1   hydrOXYzine (ATARAX/VISTARIL) 10 MG tablet Take 1 tablet (10 mg total) by mouth at bedtime as needed. 15 tablet 0   ketorolac (ACULAR) 0.5 % ophthalmic solution 1 drop 4 (four) times daily.     Lancets (ONETOUCH DELICA PLUS [id number]) MISC SMARTSIG:1 Lancet(s) Topical Twice Daily PRN     leucovorin (WELLCOVORIN) 5 MG tablet Take 5 mg by mouth 2 (two) times a week.     levothyroxine (SYNTHROID) 25 MCG tablet Take 1 tablet (25 mcg total) by mouth daily. 90 tablet 3   magnesium oxide (MAG-OX) 400 MG tablet Take 400 mg by mouth daily.     metFORMIN (GLUCOPHAGE) 1000 MG tablet TAKE 1 TABLET BY MOUTH TWICE DAILY WITH A MEAL 180 tablet 1   Methotrexate 25 MG/ML SOSY Inject 25 mg into the skin once a week.     methotrexate 50 MG/2ML injection Inject 25 mg into the vein once a week.     metroNIDAZOLE (METROGEL VAGINAL) 0.75 % vaginal gel Place 1 Applicatorful vaginally at bedtime. 70 g 0   Multiple Vitamins-Minerals (EMERGEN-C IMMUNE PLUS PO) Take 50 mg by mouth 2 times daily at 12 noon and 4 pm.     nystatin (MYCOSTATIN) 100000 UNIT/ML suspension Take 5 mLs (500,000 Units total) by mouth 3 (three) times daily. Swish and swallow 150 mL 0   ONETOUCH VERIO test strip USE 1 STRIP TO CHECK GLUCOSE TWICE DAILY AND  AS  NEEDED 100 each 0   prednisoLONE acetate (PRED FORTE) 1 % ophthalmic suspension Place 4 drops into both eyes daily.     promethazine-dextromethorphan (PROMETHAZINE-DM) 6.25-15 MG/5ML syrup Take 5 mLs by mouth at  bedtime as needed for cough. 60 mL 0   rosuvastatin (CRESTOR) 5 MG tablet Take 1 tablet (5 mg total) by mouth every other day. 45 tablet 3   SYMBICORT 160-4.5 MCG/ACT inhaler Inhale 2 puffs into the lungs 2 (two) times daily. 10.2 g 5   valACYclovir (VALTREX) 1000 MG tablet Take 1 tablet (1,000 mg total) by mouth 3 (three) times daily. 21 tablet 0   No current facility-administered medications on file prior to visit.     Review of Systems  Constitutional:  Positive for fatigue. Negative for activity change, appetite change, fever and unexpected weight change.  HENT:  Negative for congestion, ear pain, rhinorrhea, sinus pressure and sore throat.   Eyes:  Negative for pain, redness and visual disturbance.  Respiratory:  Negative for cough, shortness of breath and wheezing.   Cardiovascular:  Negative for chest pain and palpitations.  Gastrointestinal:  Negative for abdominal pain, blood in stool, constipation and diarrhea.  Endocrine: Negative for polydipsia and polyuria.  Genitourinary:  Negative for dysuria, frequency and urgency.  Musculoskeletal:  Positive for back pain. Negative for arthralgias and myalgias.  Skin:  Negative for pallor and rash.  Allergic/Immunologic: Negative for environmental allergies.  Neurological:  Negative for dizziness, syncope, weakness, numbness and headaches.  Hematological:  Negative for adenopathy. Does not bruise/bleed easily.  Psychiatric/Behavioral:  Negative for decreased concentration and dysphoric mood. The patient is not nervous/anxious.       Objective:   Physical Exam Constitutional:      General: She is not  in acute distress.    Appearance: Normal appearance. She is well-developed. She is obese. She is not ill-appearing or diaphoretic.  HENT:     Head: Normocephalic and atraumatic.  Eyes:     General: No scleral icterus.    Conjunctiva/sclera: Conjunctivae normal.     Pupils: Pupils are equal, round, and reactive to light.   Cardiovascular:     Rate and Rhythm: Normal rate and regular rhythm.  Pulmonary:     Effort: Pulmonary effort is normal.     Breath sounds: Normal breath sounds. No wheezing or rales.  Abdominal:     General: Bowel sounds are normal. There is no distension.     Palpations: Abdomen is soft.     Tenderness: There is no abdominal tenderness.  Musculoskeletal:        General: Tenderness present.     Cervical back: Normal range of motion and neck supple.     Thoracic back: No edema or bony tenderness. Normal range of motion. Scoliosis present.     Lumbar back: Spasms and tenderness present. No edema or bony tenderness. Decreased range of motion. Negative right straight leg raise test and negative left straight leg raise test.     Comments: Thoracolumbar scoliosis noted  Tenderness in L upper lumbar musculature  No bony tenderness  Nl flex/ext  Some pain on R lateral bend and twist  Neg SLR bilat Nl gait  Lymphadenopathy:     Cervical: No cervical adenopathy.  Skin:    General: Skin is warm and dry.     Coloration: Skin is not pale.     Findings: No erythema or rash.  Neurological:     Mental Status: She is alert.     Cranial Nerves: No cranial nerve deficit.     Sensory: No sensory deficit.     Motor: No atrophy or abnormal muscle tone.     Coordination: Coordination normal.     Deep Tendon Reflexes: Reflexes are normal and symmetric.     Comments: Negative SLR          Assessment & Plan:   Problem List Items Addressed This Visit       Other   Chronic low back pain - Primary    Acute on chronic today and in lower thoracic/upper lumbar area on the L  In setting of known DDD and scoliosis  Exam consistent with spasm No neuro concerns ER parameters discussed Adv use of ice/heat for 10 minutes Continue slow walking  Aleve prn (watch for interaction with mtx) and take with food Methocarbamol prn (caution of sedation) Handouts given with rehab exercises/stretches for  TS and LS to start  Consider PT ref if not improving       Relevant Medications   methocarbamol (ROBAXIN) 500 MG tablet   naproxen (NAPROSYN) 375 MG tablet

## 2020-10-11 ENCOUNTER — Telehealth: Payer: Self-pay | Admitting: *Deleted

## 2020-10-11 NOTE — Telephone Encounter (Signed)
Received a fax from [facility] saying:  DRUG INTERACTION BETWEEN METHOTREXATE AND NAPROXEN. COMBO SHOULD BE AVOIDED

## 2020-10-11 NOTE — Telephone Encounter (Signed)
Aware of this and d/w pt at her last visit.  She has been taking it on and off and the MTX prescribing provider knows (per pt) She was adv to use caution, watch for any problems and take only as needed.

## 2020-11-19 DIAGNOSIS — G4733 Obstructive sleep apnea (adult) (pediatric): Secondary | ICD-10-CM | POA: Diagnosis not present

## 2020-11-22 DIAGNOSIS — H35353 Cystoid macular degeneration, bilateral: Secondary | ICD-10-CM | POA: Diagnosis not present

## 2020-11-22 DIAGNOSIS — H20023 Recurrent acute iridocyclitis, bilateral: Secondary | ICD-10-CM | POA: Diagnosis not present

## 2020-11-22 DIAGNOSIS — Z1159 Encounter for screening for other viral diseases: Secondary | ICD-10-CM | POA: Diagnosis not present

## 2020-11-22 DIAGNOSIS — H35342 Macular cyst, hole, or pseudohole, left eye: Secondary | ICD-10-CM | POA: Diagnosis not present

## 2020-11-22 DIAGNOSIS — Z79899 Other long term (current) drug therapy: Secondary | ICD-10-CM | POA: Diagnosis not present

## 2020-11-22 DIAGNOSIS — H3023 Posterior cyclitis, bilateral: Secondary | ICD-10-CM | POA: Diagnosis not present

## 2020-11-22 DIAGNOSIS — R7401 Elevation of levels of liver transaminase levels: Secondary | ICD-10-CM | POA: Diagnosis not present

## 2020-12-09 ENCOUNTER — Encounter: Payer: Self-pay | Admitting: Family Medicine

## 2020-12-09 MED ORDER — OSELTAMIVIR PHOSPHATE 75 MG PO CAPS: 75.0000 mg | ORAL_CAPSULE | Freq: Every day | ORAL | 0 refills | Status: DC

## 2021-01-10 ENCOUNTER — Other Ambulatory Visit: Payer: Self-pay | Admitting: Family Medicine

## 2021-01-31 DIAGNOSIS — H3023 Posterior cyclitis, bilateral: Secondary | ICD-10-CM | POA: Diagnosis not present

## 2021-01-31 DIAGNOSIS — H35353 Cystoid macular degeneration, bilateral: Secondary | ICD-10-CM | POA: Diagnosis not present

## 2021-01-31 DIAGNOSIS — Z79899 Other long term (current) drug therapy: Secondary | ICD-10-CM | POA: Diagnosis not present

## 2021-01-31 DIAGNOSIS — H20023 Recurrent acute iridocyclitis, bilateral: Secondary | ICD-10-CM | POA: Diagnosis not present

## 2021-02-11 DIAGNOSIS — G4733 Obstructive sleep apnea (adult) (pediatric): Secondary | ICD-10-CM | POA: Diagnosis not present

## 2021-02-26 ENCOUNTER — Ambulatory Visit: Payer: Medicare HMO | Admitting: Allergy & Immunology

## 2021-02-26 ENCOUNTER — Encounter: Payer: Self-pay | Admitting: Allergy & Immunology

## 2021-02-26 ENCOUNTER — Other Ambulatory Visit: Payer: Self-pay

## 2021-02-26 VITALS — BP 148/94 | HR 82 | Resp 17

## 2021-02-26 DIAGNOSIS — J310 Chronic rhinitis: Secondary | ICD-10-CM

## 2021-02-26 DIAGNOSIS — J4540 Moderate persistent asthma, uncomplicated: Secondary | ICD-10-CM | POA: Diagnosis not present

## 2021-02-26 DIAGNOSIS — D849 Immunodeficiency, unspecified: Secondary | ICD-10-CM | POA: Diagnosis not present

## 2021-02-26 MED ORDER — ALBUTEROL SULFATE HFA 108 (90 BASE) MCG/ACT IN AERS: 2.0000 | INHALATION_SPRAY | Freq: Four times a day (QID) | RESPIRATORY_TRACT | 1 refills | Status: DC | PRN

## 2021-02-26 MED ORDER — SYMBICORT 160-4.5 MCG/ACT IN AERO: 2.0000 | INHALATION_SPRAY | Freq: Two times a day (BID) | RESPIRATORY_TRACT | 5 refills | Status: DC

## 2021-02-26 MED ORDER — ALBUTEROL SULFATE (2.5 MG/3ML) 0.083% IN NEBU: 2.5000 mg | INHALATION_SOLUTION | RESPIRATORY_TRACT | 1 refills | Status: DC | PRN

## 2021-02-26 NOTE — Patient Instructions (Addendum)
1. Moderate persistent asthma, uncomplicated - Lung testing looks better today, which makes me very happy.  - You have such a good handle on your symptoms.  - Daily controller medication(s): NONE - Prior to physical activity: ProAir 2 puffs 10-15 minutes before physical activity. - Rescue medications: ProAir 4 puffs every 4-6 hours as needed or albuterol nebulizer one vial every 4-6 hours as needed  - During period of respiratory distress: start Symbicort 160/4.54mcg two puffs twice daily with spacer for 1-2 weeks - Asthma control goals:  * Full participation in all desired activities (may need albuterol before activity) * Albuterol use two time or less a week on average (not counting use with activity) * Cough interfering with sleep two time or less a month * Oral steroids no more than once a year * No hospitalizations  2. Chronic rhinitis  - Continue with levocetirizine 5mg  daily as needed - Continue with fluticasone 1-2 sprays per nostril daily as needed.   3. Return in about 6 months (around 08/26/2021).    Please inform us of any Emergency Department visits, hospitalizations, or changes in symptoms. Call us before going to the ED for breathing or allergy symptoms since we might be able to fit you in for a sick visit. Feel free to contact us anytime with any questions, problems, or concerns.  It was a pleasure to see you again today!  Websites that have reliable patient information: 1. American Academy of Asthma, Allergy, and Immunology: www.aaaai.org 2. Food Allergy Research and Education (FARE): foodallergy.org 3. Mothers of Asthmatics: http://www.asthmacommunitynetwork.org 4. American College of Allergy, Asthma, and Immunology: www.acaai.org   COVID-19 Vaccine Information can be found at: ShippingScam.co.uk For questions related to vaccine distribution or appointments, please email [email or web address] or call  [phone].     Like [place] on [place] and Instagram for our latest updates!        Make sure you are registered to vote! If you have moved or changed any of your contact information, you will need to get this updated before voting!  In some cases, you MAY be able to register to vote online: [email or web address]

## 2021-02-26 NOTE — Progress Notes (Signed)
FOLLOW UP  Date of Service/Encounter:  02/26/21   Assessment:   Moderate persistent asthma, uncomplicated   Chronic rhinitis - s/p 2 rounds of allergen immunotherapy    Complicated past medical history including fibromyalgia, uveitis, and panniculitis   Immunosuppressed state (methotrexate and Humira) for treatment of recurrent bilateral iritis and uveitis   Type 2 diabetes    Asthma Reportables:  Severity: moderate persistent  Risk: high Control: not well controlled  Plan/Recommendations:   1. Moderate persistent asthma, uncomplicated - Lung testing looks better today, which makes me very happy.  - You have such a good handle on your symptoms.  - Daily controller medication(s): NONE - Prior to physical activity: ProAir 2 puffs 10-15 minutes before physical activity. - Rescue medications: ProAir 4 puffs every 4-6 hours as needed or albuterol nebulizer one vial every 4-6 hours as needed  - During period of respiratory distress: start Symbicort 160/4.76mcg two puffs twice daily with spacer for 1-2 weeks - Asthma control goals:  * Full participation in all desired activities (may need albuterol before activity) * Albuterol use two time or less a week on average (not counting use with activity) * Cough interfering with sleep two time or less a month * Oral steroids no more than once a year * No hospitalizations  2. Chronic rhinitis  - Continue with levocetirizine 5mg  daily as needed - Continue with fluticasone 1-2 sprays per nostril daily as needed.   3. Return in about 6 months (around 08/26/2021).   Subjective:   [name] is a 56 y.o. female presenting today for follow up of  Chief Complaint  Patient presents with   Asthma    [name] has a history of the following: Patient Active Problem List   Diagnosis Date Noted   Class 1 obesity due to excess calories with body mass index (BMI) of 32.0 to 32.9 in adult 07/10/2020   ETD (eustachian tube  dysfunction) 10/09/2019   Hyperlipidemia associated with type 2 diabetes mellitus ([place]) 07/10/2019   Type 2 diabetes mellitus with hyperglycemia ([place]) 06/06/2019   Exposure to COVID-19 virus 05/17/2019   Peripheral edema 10/30/2017   Moderate persistent asthma, uncomplicated 68/12/7515   Chronic rhinitis 08/10/2017   Cough 06/06/2017   Welcome to Medicare preventive visit 04/20/2017   Chronic low back pain 12/09/2015   Degeneration of lumbar or lumbosacral intervertebral disc 12/09/2015   Hypothyroid 01/24/2014   Pseudophakia of both eyes 12/15/2013   Nasal septal deviation 01/03/2013   Encounter for Medicare annual wellness exam 11/02/2012   Hydradenitis 11/02/2012   Cystoid macular degeneration of retina 05/13/2012   Macular hole 03/23/2012   Screening-pulmonary TB 03/23/2012   Routine general medical examination at a health care facility 10/02/2011   Iritis, recurrent 10/02/2011   Tachycardia 10/02/2011   ADD (attention deficit disorder) 08/20/2010   DYSHIDROTIC ECZEMA, HANDS 11/12/2009   Obstructive sleep apnea 11/19/2006   Depression with anxiety 09/29/2006   Essential hypertension 09/29/2006   Seasonal and perennial allergic rhinitis 09/29/2006   Asthma, mild intermittent, well-controlled 09/29/2006   GERD 09/29/2006   IBS 09/29/2006   ENDOMETRIOSIS 09/29/2006   FEMALE INFERTILITY 09/29/2006   Fibromyalgia 09/29/2006   INSOMNIA 09/29/2006    History obtained from: chart review and patient.  [name] is a 56 y.o. female presenting for a follow up visit.  We last saw her in October 2022.  At that time, her lung testing look lower, but we decided to trended over time.  We continue with  albuterol as needed.  She has Symbicort that she only adds when she is sick.  For her rhinitis, would continue with Xyzal as well as Flonase.  Since last visit, she has mostly done well.   Asthma/Respiratory Symptom History: She did have some respiratory issues a few times since the last  visit. She has been using her Symbicort a few times per week. While she has having the hard time breathing, she would add on the nebulizer. She did not need to get prednisone or go to the ED or Urgent Care. She does not cough at night. All in all asthma seems well controlled.   Allergic Rhinitis Symptom History: She is on Flonase when she has some breakthrough symptoms. This is not a daily medication. She has not needed her levocetirizine consistently. Winter has been fairly food. Fall is the worst time of the year for her symptoms.  Fibromyalgia is under fair control. She does have flare ups occasionally and she will just push through. She remains on the MTX and Humira and two eye drops for her bilateral iritis. She will get some inflammation that will change her vision. But mostly she does not notice. She will get her eye evaluated every 4 months at [facility] by Dr. Tomi Likens. She also sees the Rheumatologist at the same time for Humira and MTX.   Otherwise, there have been no changes to her past medical history, surgical history, family history, or social history.    Review of Systems  Constitutional: Negative.  Negative for chills, fever, malaise/fatigue and weight loss.  HENT: Negative.  Negative for congestion, ear discharge, ear pain and sinus pain.   Eyes:  Negative for pain, discharge and redness.  Respiratory:  Negative for cough, sputum production, shortness of breath, wheezing and stridor.   Cardiovascular: Negative.  Negative for chest pain and palpitations.  Gastrointestinal:  Negative for abdominal pain, constipation, diarrhea, heartburn, nausea and vomiting.  Skin: Negative.  Negative for itching and rash.  Neurological:  Negative for dizziness and headaches.  Endo/Heme/Allergies:  Negative for environmental allergies. Does not bruise/bleed easily.      Objective:   Blood pressure (!) 148/94, pulse 82, resp. rate 17, last menstrual period 08/05/2006, SpO2 98 %. There is no height or  weight on file to calculate BMI.    Physical Exam Vitals reviewed.  Constitutional:      Appearance: She is well-developed.     Comments: Very pleasant female. Talkative.   HENT:     Head: Normocephalic and atraumatic.     Right Ear: Tympanic membrane, ear canal and external ear normal.     Left Ear: Tympanic membrane, ear canal and external ear normal.     Nose: Mucosal edema present. No nasal deformity, septal deviation or rhinorrhea.     Right Turbinates: Enlarged, swollen and pale.     Left Turbinates: Enlarged, swollen and pale.     Right Sinus: No maxillary sinus tenderness or frontal sinus tenderness.     Left Sinus: No maxillary sinus tenderness or frontal sinus tenderness.     Mouth/Throat:     Mouth: Mucous membranes are not pale and not dry.     Pharynx: Uvula midline.  Eyes:     General: Lids are normal. No allergic shiner.       Right eye: No discharge.        Left eye: No discharge.     Conjunctiva/sclera: Conjunctivae normal.     Right eye: Right conjunctiva is not injected. No  chemosis.    Left eye: Left conjunctiva is not injected. No chemosis.    Pupils: Pupils are equal, round, and reactive to light.  Cardiovascular:     Rate and Rhythm: Normal rate and regular rhythm.     Heart sounds: Normal heart sounds.  Pulmonary:     Effort: Pulmonary effort is normal. No tachypnea, accessory muscle usage or respiratory distress.     Breath sounds: Normal breath sounds. No wheezing, rhonchi or rales.  Chest:     Chest wall: No tenderness.  Lymphadenopathy:     Cervical: No cervical adenopathy.  Skin:    General: Skin is warm.     Capillary Refill: Capillary refill takes less than 2 seconds.     Coloration: Skin is not pale.     Findings: No abrasion, erythema, petechiae or rash. Rash is not papular, urticarial or vesicular.  Neurological:     Mental Status: She is alert.  Psychiatric:        Behavior: Behavior is cooperative.     Diagnostic studies:    Spirometry: results normal (FEV1: 2.15/71%, FVC: 2.69/70%, FEV1/FVC: 80%).    Spirometry consistent with normal pattern.   Allergy Studies: none        Salvatore Marvel, MD  [facility] [place] [facility]

## 2021-03-04 ENCOUNTER — Telehealth: Payer: Self-pay | Admitting: Family Medicine

## 2021-03-04 NOTE — Telephone Encounter (Signed)
LVM for pt to rtn my call to schedule AWV with [facility]. Please schedule this appt if pt calls the office.  °

## 2021-03-22 ENCOUNTER — Other Ambulatory Visit: Payer: Self-pay | Admitting: Family Medicine

## 2021-03-24 NOTE — Telephone Encounter (Signed)
Is this this okay to refill  ?

## 2021-03-26 ENCOUNTER — Other Ambulatory Visit: Payer: Self-pay

## 2021-03-26 ENCOUNTER — Emergency Department (HOSPITAL_COMMUNITY)
Admission: EM | Admit: 2021-03-26 | Discharge: 2021-03-27 | Disposition: A | Discharge status: Home / Self Care | Payer: Medicare HMO | Attending: Emergency Medicine | Admitting: Emergency Medicine

## 2021-03-26 ENCOUNTER — Encounter (HOSPITAL_COMMUNITY): Payer: Self-pay | Admitting: Emergency Medicine

## 2021-03-26 DIAGNOSIS — R000 Tachycardia, unspecified: Secondary | ICD-10-CM | POA: Diagnosis not present

## 2021-03-26 DIAGNOSIS — Z20822 Contact with and (suspected) exposure to covid-19: Secondary | ICD-10-CM | POA: Diagnosis not present

## 2021-03-26 DIAGNOSIS — Z79899 Other long term (current) drug therapy: Secondary | ICD-10-CM | POA: Insufficient documentation

## 2021-03-26 DIAGNOSIS — D72829 Elevated white blood cell count, unspecified: Secondary | ICD-10-CM | POA: Diagnosis not present

## 2021-03-26 DIAGNOSIS — J45909 Unspecified asthma, uncomplicated: Secondary | ICD-10-CM | POA: Insufficient documentation

## 2021-03-26 DIAGNOSIS — I10 Essential (primary) hypertension: Secondary | ICD-10-CM | POA: Insufficient documentation

## 2021-03-26 DIAGNOSIS — Z7951 Long term (current) use of inhaled steroids: Secondary | ICD-10-CM | POA: Insufficient documentation

## 2021-03-26 DIAGNOSIS — R112 Nausea with vomiting, unspecified: Secondary | ICD-10-CM | POA: Diagnosis present

## 2021-03-26 DIAGNOSIS — R197 Diarrhea, unspecified: Secondary | ICD-10-CM | POA: Insufficient documentation

## 2021-03-26 DIAGNOSIS — Z7984 Long term (current) use of oral hypoglycemic drugs: Secondary | ICD-10-CM | POA: Insufficient documentation

## 2021-03-26 DIAGNOSIS — R6883 Chills (without fever): Secondary | ICD-10-CM | POA: Insufficient documentation

## 2021-03-26 DIAGNOSIS — R1110 Vomiting, unspecified: Secondary | ICD-10-CM | POA: Diagnosis not present

## 2021-03-26 DIAGNOSIS — Z7952 Long term (current) use of systemic steroids: Secondary | ICD-10-CM | POA: Diagnosis not present

## 2021-03-26 DIAGNOSIS — E119 Type 2 diabetes mellitus without complications: Secondary | ICD-10-CM | POA: Insufficient documentation

## 2021-03-26 DIAGNOSIS — R519 Headache, unspecified: Secondary | ICD-10-CM | POA: Insufficient documentation

## 2021-03-26 HISTORY — DX: Type 2 diabetes mellitus without complications: E11.9

## 2021-03-26 LAB — CBC
HCT: 45.3 % (ref 36.0–46.0)
Hemoglobin: 15 g/dL (ref 12.0–15.0)
MCH: 29.6 pg (ref 26.0–34.0)
MCHC: 33.1 g/dL (ref 30.0–36.0)
MCV: 89.5 fL (ref 80.0–100.0)
Platelets: 243 10*3/uL (ref 150–400)
RBC: 5.06 MIL/uL (ref 3.87–5.11)
RDW: 14.5 % (ref 11.5–15.5)
WBC: 15.5 10*3/uL — ABNORMAL HIGH (ref 4.0–10.5)
nRBC: 0 % (ref 0.0–0.2)

## 2021-03-26 LAB — COMPREHENSIVE METABOLIC PANEL
ALT: 30 U/L (ref 0–44)
AST: 83 U/L — ABNORMAL HIGH (ref 15–41)
Albumin: 4 g/dL (ref 3.5–5.0)
Alkaline Phosphatase: 83 U/L (ref 38–126)
Anion gap: 14 (ref 5–15)
BUN: 20 mg/dL (ref 6–20)
CO2: 20 mmol/L — ABNORMAL LOW (ref 22–32)
Calcium: 8.9 mg/dL (ref 8.9–10.3)
Chloride: 102 mmol/L (ref 98–111)
Creatinine, Ser: 0.82 mg/dL (ref 0.44–1.00)
GFR, Estimated: >60 mL/min (ref >60)
Glucose, Bld: 219 mg/dL — ABNORMAL HIGH (ref 70–99)
Potassium: 3.9 mmol/L (ref 3.5–5.1)
Sodium: 136 mmol/L (ref 135–145)
Total Bilirubin: 0.7 mg/dL (ref 0.3–1.2)
Total Protein: 7.6 g/dL (ref 6.5–8.1)

## 2021-03-26 LAB — RESP PANEL BY RT-PCR (FLU A&B, COVID) ARPGX2
Influenza A by PCR: NEGATIVE
Influenza B by PCR: NEGATIVE
SARS Coronavirus 2 by RT PCR: NEGATIVE

## 2021-03-26 LAB — LIPASE, BLOOD: Lipase: 27 U/L (ref 11–51)

## 2021-03-26 MED ORDER — KETOROLAC TROMETHAMINE 30 MG/ML IJ SOLN
15.0000 mg | Freq: Once | INTRAMUSCULAR | Status: AC
  Administered 2021-03-26: 15 mg via INTRAVENOUS
  Filled 2021-03-26: qty 1

## 2021-03-26 MED ORDER — ONDANSETRON HCL 4 MG/2ML IJ SOLN
4.0000 mg | Freq: Once | INTRAMUSCULAR | Status: DC | PRN
  Filled 2021-03-26: qty 2

## 2021-03-26 MED ORDER — LACTATED RINGERS IV BOLUS
1000.0000 mL | Freq: Once | INTRAVENOUS | Status: AC
Start: 2021-03-26 — End: 2021-03-27
  Administered 2021-03-26: 1000 mL via INTRAVENOUS

## 2021-03-26 MED ORDER — SODIUM CHLORIDE 0.9 % IV BOLUS
1000.0000 mL | Freq: Once | INTRAVENOUS | Status: AC
  Administered 2021-03-26: 1000 mL via INTRAVENOUS

## 2021-03-26 MED ORDER — PROCHLORPERAZINE EDISYLATE 10 MG/2ML IJ SOLN
10.0000 mg | Freq: Once | INTRAMUSCULAR | Status: AC
  Administered 2021-03-26: 10 mg via INTRAVENOUS
  Filled 2021-03-26: qty 2

## 2021-03-26 MED ORDER — ONDANSETRON 4 MG PO TBDP: 4.0000 mg | ORAL_TABLET | Freq: Three times a day (TID) | ORAL | 0 refills | Status: DC | PRN

## 2021-03-26 NOTE — ED Provider Notes (Signed)
?[facility] ?Provider Note ? ? ?CSN: [id number] ?Arrival date & time: 03/26/21  2049 ? ?  ? ?History ? ?Chief Complaint  ?Patient presents with  ? Emesis  ? ? ?[name] is a 56 y.o. female. ? ? ?Emesis ?Associated symptoms: diarrhea and headaches   ?Patient presents with nausea vomiting and headache.  Also some diarrhea.  No definite sick contacts.  History of migraines.  Has had some chills.  Not having fevers.  Not hungry.  States history of diabetes and of migraines.  Also history of fibromyalgia. ?  ?Past Medical History:  ?Diagnosis Date  ? Allergy   ? allergic rhinitis  ? Asthma   ? Chronic headaches   ? Degenerative disc disease   ? Depression   ? Diabetes mellitus without complication ([place])   ? Eczema   ? Fibromyalgia   ? Fx of fibula 2009  ? GERD (gastroesophageal reflux disease)   ? Hemorrhoids   ? Hypertension   ? IBS (irritable bowel syndrome)   ? Iritis, chronic   ? Macular hole of right eye   ? Sleep apnea   ? ? ?Home Medications ?Prior to Admission medications   ?Medication Sig Start Date End Date Taking? Authorizing Provider  ?ondansetron (ZOFRAN-ODT) 4 MG disintegrating tablet Take 1 tablet (4 mg total) by mouth every 8 (eight) hours as needed for nausea or vomiting. 03/26/21  Yes Davonna Belling, MD  ?acetaminophen (TYLENOL) 500 MG tablet OTC as directed.    [provider]  ?Adalimumab 40 MG/0.8ML PSKT Inject 40 mg into the skin once a week.     [provider]  ?albuterol (PROVENTIL) (2.5 MG/3ML) 0.083% nebulizer solution Take 3 mLs (2.5 mg total) by nebulization every 4 (four) hours as needed for wheezing or shortness of breath. 02/26/21   Valentina Shaggy, MD  ?albuterol (VENTOLIN HFA) 108 (90 Base) MCG/ACT inhaler Inhale 2 puffs into the lungs every 6 (six) hours as needed for wheezing or shortness of breath. 02/26/21   Valentina Shaggy, MD  ?buPROPion (WELLBUTRIN XL) 300 MG 24 hr tablet Take 1 tablet by mouth once daily 03/24/21   Tower,  Wynelle Fanny, MD  ?carvedilol (COREG) 6.25 MG tablet Take 1 tablet (6.25 mg total) by mouth 2 (two) times daily with a meal. 10/07/20   Tower, Wynelle Fanny, MD  ?Cholecalciferol (VITAMIN D3 PO) Take 1 capsule by mouth daily.    [provider]  ?[employer]S TRIPLE MAGNESIUM COMPLEX PO Take 1 capsule by mouth daily.    [provider]  ?cyclobenzaprine (FLEXERIL) 10 MG tablet Take 1 tablet (10 mg total) by mouth 3 (three) times daily as needed for muscle spasms. Caution of sedation 11/16/18   Tower, Wynelle Fanny, MD  ?diphenhydrAMINE (BENADRYL) 25 mg capsule Take 25 mg by mouth as needed. OTC as directed.    [provider]  ?esomeprazole (NEXIUM) 20 MG capsule Take 1 capsule (20 mg total) by mouth daily at 12 noon. 04/22/16   Tower, Wynelle Fanny, MD  ?fluticasone (FLONASE) 50 MCG/ACT nasal spray Place 2 sprays into both nostrils daily.    [provider]  ?folic acid (FOLVITE) 1 MG tablet Take 3 mg by mouth 4 (four) times a week.     [provider]  ?glucose blood (ONETOUCH VERIO) test strip Use as instructed 03/27/21   Tower, Wynelle Fanny, MD  ?hydrochlorothiazide (MICROZIDE) 12.5 MG capsule Take 1 capsule by mouth once daily 01/13/21   Tower, Wynelle Fanny, MD  ?  ketorolac (ACULAR) 0.5 % ophthalmic solution 1 drop 4 (four) times daily. 11/24/18   [provider]  ?Lancets Glory [place] DELICA PLUS [id number]) [place] SMARTSIG:1 Lancet(s) Topical Twice Daily PRN 08/22/19   [provider]  ?leucovorin (WELLCOVORIN) 5 MG tablet Take 5 mg by mouth 2 (two) times a week.    [provider]  ?levothyroxine (SYNTHROID) 25 MCG tablet Take 1 tablet (25 mcg total) by mouth daily. 07/10/20   Tower, Wynelle Fanny, MD  ?magnesium oxide (MAG-OX) 400 MG tablet Take 400 mg by mouth daily.    [provider]  ?metFORMIN (GLUCOPHAGE) 1000 MG tablet TAKE 1 TABLET BY MOUTH TWICE DAILY WITH A MEAL 10/03/20   Tower, Wynelle Fanny, MD  ?methocarbamol (ROBAXIN) 500 MG tablet Take 1 tablet (500 mg total) by mouth every 8  (eight) hours as needed for muscle spasms. Caution of sedation 10/07/20   Tower, Wynelle Fanny, MD  ?Methotrexate 25 MG/ML SOSY Inject 25 mg into the skin once a week.    [provider]  ?metroNIDAZOLE (METROGEL VAGINAL) 0.75 % vaginal gel Place 1 Applicatorful vaginally at bedtime. ?Patient not taking: Reported on 02/26/2021 06/01/19   Tower, Wynelle Fanny, MD  ?Multiple Vitamins-Minerals (EMERGEN-C IMMUNE PLUS PO) Take 50 mg by mouth 2 times daily at 12 noon and 4 pm.    [provider]  ?naproxen (NAPROSYN) 375 MG tablet Take 1 tablet (375 mg total) by mouth 2 (two) times daily as needed. With a meal 10/07/20   Tower, [place] A, MD  ?prednisoLONE acetate (PRED FORTE) 1 % ophthalmic suspension Place 4 drops into both eyes daily.    [provider]  ?promethazine-dextromethorphan (PROMETHAZINE-DM) 6.25-15 MG/5ML syrup Take 5 mLs by mouth at bedtime as needed for cough. 05/16/20   [place], [place], PA-C  ?rosuvastatin (CRESTOR) 5 MG tablet Take 1 tablet (5 mg total) by mouth every other day. 07/10/20   Tower, Wynelle Fanny, MD  ?Dellis Anes 160-4.5 MCG/ACT inhaler Inhale 2 puffs into the lungs 2 (two) times daily. 02/26/21   Valentina Shaggy, MD  ?valACYclovir (VALTREX) 1000 MG tablet Take 1 tablet (1,000 mg total) by mouth 3 (three) times daily. 11/20/19   Tower, Wynelle Fanny, MD  ?   ? ?Allergies    ?Amlodipine, Azithromycin, Cetirizine hcl, Erythromycin, Escitalopram oxalate, Lisinopril, Loratadine, Nifedipine, Sertraline hcl, and Vyvanse [lisdexamfetamine dimesylate]   ? ?Review of Systems   ?Review of Systems  ?Constitutional:  Positive for appetite change.  ?Gastrointestinal:  Positive for diarrhea, nausea and vomiting.  ?Genitourinary:  Negative for difficulty urinating and enuresis.  ?Neurological:  Positive for headaches.  ? ?Physical Exam ?Updated Vital Signs ?BP (!) 164/95   Pulse 96   Temp 99 ?F (37.2 ?C)   Resp 18   Ht '5\' 8"'$  (1.727 m)   Wt 94.3 kg   LMP 08/05/2006   SpO2 100%   BMI 31.63 kg/m?   ?Physical Exam ?Vitals and nursing note reviewed.  ?Eyes:  ?   General: No scleral icterus. ?Cardiovascular:  ?   Rate and Rhythm: Regular rhythm. Tachycardia present.  ?Abdominal:  ?   Tenderness: There is no abdominal tenderness.  ?Musculoskeletal:     ?   General: No tenderness.  ?Skin: ?   General: Skin is warm.  ?   Capillary Refill: Capillary refill takes less than 2 seconds.  ?Neurological:  ?   Mental Status: She is alert and oriented to person, place, and time.  ? ? ?ED Results / Procedures / Treatments   ?  Labs ?(all labs ordered are listed, but only abnormal results are displayed) ?Labs Reviewed  ?COMPREHENSIVE METABOLIC PANEL - Abnormal; Notable for the following components:  ?    Result Value  ? CO2 20 (*)   ? Glucose, Bld 219 (*)   ? AST 83 (*)   ? All other components within normal limits  ?CBC - Abnormal; Notable for the following components:  ? WBC 15.5 (*)   ? All other components within normal limits  ?RESP PANEL BY RT-PCR (FLU A&B, COVID) [id number]  ?LIPASE, BLOOD  ? ? ?EKG ?None ? ?Radiology ?No results found. ? ?Procedures ?Procedures  ? ? ?Medications Ordered in ED ?Medications  ?sodium chloride 0.9 % bolus 1,000 mL (0 mLs Intravenous Stopped 03/27/21 0037)  ?prochlorperazine (COMPAZINE) injection 10 mg (10 mg Intravenous Given 03/26/21 2202)  ?ketorolac (TORADOL) 30 MG/ML injection 15 mg (15 mg Intravenous Given 03/26/21 2320)  ?lactated ringers bolus 1,000 mL (0 mLs Intravenous Stopped 03/27/21 0037)  ? ? ?ED Course/ Medical Decision Making/ A&P ?  ?                        ?Medical Decision Making ?Amount and/or Complexity of Data Reviewed ?Labs: ordered. ? ?Risk ?Prescription drug management. ? ? ?Patient presents with nausea vomiting diarrhea and headache.  Began earlier today.  Has had some chills.  No known sick contacts.  Nonfocal exam.  Previous history of migraines.  Patient is diabetic.  White count mildly elevated.  Likely secondary to some sort of infectious gastroenteritis.  Nausea  improved.  Fluid bolus given.  Then given Toradol and more fluid.  Felt better.  Felt as if she can tolerate orals.  Will discharge home.  Vital signs of improved. ? ? ? ? ? ? ? ?Final Clinical Impression(s) / ED Diagnoses

## 2021-03-26 NOTE — ED Triage Notes (Signed)
Pt c/o n/v/d/headache since 1300 ?

## 2021-03-27 ENCOUNTER — Other Ambulatory Visit: Payer: Self-pay | Admitting: Family Medicine

## 2021-03-27 MED ORDER — ONETOUCH VERIO VI STRP: ORAL_STRIP | 0 refills | Status: DC

## 2021-03-27 NOTE — Telephone Encounter (Signed)
[place] called in stated pt needs instruction with RX glucose blood (ONETOUCH VERIO) test strip  for insurance to cover it . Would like a call back # [phone]  ?

## 2021-03-28 MED ORDER — ONETOUCH VERIO VI STRP: ORAL_STRIP | 3 refills | Status: DC

## 2021-03-28 NOTE — Telephone Encounter (Signed)
Rx just says use as instructed, please advise on what instructions you would like Rx to say ?

## 2021-04-30 DIAGNOSIS — D2271 Melanocytic nevi of right lower limb, including hip: Secondary | ICD-10-CM | POA: Diagnosis not present

## 2021-04-30 DIAGNOSIS — D2262 Melanocytic nevi of left upper limb, including shoulder: Secondary | ICD-10-CM | POA: Diagnosis not present

## 2021-04-30 DIAGNOSIS — D1801 Hemangioma of skin and subcutaneous tissue: Secondary | ICD-10-CM | POA: Diagnosis not present

## 2021-04-30 DIAGNOSIS — D2261 Melanocytic nevi of right upper limb, including shoulder: Secondary | ICD-10-CM | POA: Diagnosis not present

## 2021-04-30 DIAGNOSIS — D2239 Melanocytic nevi of other parts of face: Secondary | ICD-10-CM | POA: Diagnosis not present

## 2021-04-30 DIAGNOSIS — D225 Melanocytic nevi of trunk: Secondary | ICD-10-CM | POA: Diagnosis not present

## 2021-04-30 DIAGNOSIS — L720 Epidermal cyst: Secondary | ICD-10-CM | POA: Diagnosis not present

## 2021-04-30 DIAGNOSIS — L821 Other seborrheic keratosis: Secondary | ICD-10-CM | POA: Diagnosis not present

## 2021-05-03 DIAGNOSIS — H3023 Posterior cyclitis, bilateral: Secondary | ICD-10-CM | POA: Diagnosis not present

## 2021-05-03 DIAGNOSIS — Z79899 Other long term (current) drug therapy: Secondary | ICD-10-CM | POA: Diagnosis not present

## 2021-05-03 DIAGNOSIS — H20023 Recurrent acute iridocyclitis, bilateral: Secondary | ICD-10-CM | POA: Diagnosis not present

## 2021-05-09 DIAGNOSIS — Z79899 Other long term (current) drug therapy: Secondary | ICD-10-CM | POA: Diagnosis not present

## 2021-05-09 DIAGNOSIS — Z961 Presence of intraocular lens: Secondary | ICD-10-CM | POA: Diagnosis not present

## 2021-05-09 DIAGNOSIS — Z23 Encounter for immunization: Secondary | ICD-10-CM | POA: Diagnosis not present

## 2021-05-09 DIAGNOSIS — H20023 Recurrent acute iridocyclitis, bilateral: Secondary | ICD-10-CM | POA: Diagnosis not present

## 2021-05-09 DIAGNOSIS — R7989 Other specified abnormal findings of blood chemistry: Secondary | ICD-10-CM | POA: Diagnosis not present

## 2021-05-09 DIAGNOSIS — E119 Type 2 diabetes mellitus without complications: Secondary | ICD-10-CM | POA: Diagnosis not present

## 2021-05-09 DIAGNOSIS — R7401 Elevation of levels of liver transaminase levels: Secondary | ICD-10-CM | POA: Diagnosis not present

## 2021-05-09 DIAGNOSIS — H3023 Posterior cyclitis, bilateral: Secondary | ICD-10-CM | POA: Diagnosis not present

## 2021-05-09 DIAGNOSIS — H35353 Cystoid macular degeneration, bilateral: Secondary | ICD-10-CM | POA: Diagnosis not present

## 2021-05-12 ENCOUNTER — Telehealth: Payer: Self-pay | Admitting: Family Medicine

## 2021-05-12 DIAGNOSIS — G4733 Obstructive sleep apnea (adult) (pediatric): Secondary | ICD-10-CM | POA: Diagnosis not present

## 2021-05-12 NOTE — Telephone Encounter (Signed)
Left message for patient to call back and schedule Medicare Annual Wellness Visit (AWV) to be completed by video or phone. ? ? ? ?Last AWV: 04/20/2017 ? ? ? ?Please schedule at anytime with [place]    ? ? ? ?45 minute appointment ? ? ? ?Any questions, please contact me at [phone] ?

## 2021-05-14 ENCOUNTER — Ambulatory Visit (INDEPENDENT_AMBULATORY_CARE_PROVIDER_SITE_OTHER): Payer: Medicare HMO

## 2021-05-14 DIAGNOSIS — G4730 Sleep apnea, unspecified: Secondary | ICD-10-CM | POA: Insufficient documentation

## 2021-05-14 DIAGNOSIS — Z1231 Encounter for screening mammogram for malignant neoplasm of breast: Secondary | ICD-10-CM | POA: Diagnosis not present

## 2021-05-14 DIAGNOSIS — Z0000 Encounter for general adult medical examination without abnormal findings: Secondary | ICD-10-CM

## 2021-05-14 NOTE — Progress Notes (Signed)
?Virtual Visit via Telephone Note ? ?I connected with  [name] on 05/14/21 at  1:30 PM EDT by telephone and verified that I am speaking with the correct person using two identifiers. ? ?Location: ?Patient: home ?Provider: [place] ?Persons participating in the virtual visit: patient/Nurse Health Advisor ?  ?I discussed the limitations, risks, security and privacy concerns of performing an evaluation and management service by telephone and the availability of in person appointments. The patient expressed understanding and agreed to proceed. ? ?Interactive audio and video telecommunications were attempted between this nurse and patient, however failed, due to patient having technical difficulties OR patient did not have access to video capability.  We continued and completed visit with audio only. ? ?Some vital signs may be absent or patient reported.  ? ?Dionisio David, LPN ? ?Subjective:  ? [name] is a 56 y.o. female who presents for Medicare Annual (Subsequent) preventive examination. ? ?Review of Systems    ? ?  ? ?   ?Objective:  ?  ?There were no vitals filed for this visit. ?There is no height or weight on file to calculate BMI. ? ? ?  03/26/2021  ?  9:21 PM 06/01/2019  ?  5:23 PM 04/10/2016  ? 10:30 AM 03/20/2015  ? 11:12 AM 06/14/2014  ?  9:04 AM  ?Advanced Directives  ?Does Patient Have a Medical Advance Directive? No Yes;No No Yes Yes  ?Type of Theatre stage manager of [place];Living will [place];Living will  ?Does patient want to make changes to medical advance directive?    Yes - information given   ?Copy of [place] in Chart?    No - copy requested   ?Would patient like information on creating a medical advance directive? No - Patient declined      ? ? ?Current Medications (verified) ?Outpatient Encounter Medications as of 05/14/2021  ?Medication Sig  ? Insulin Syringe-Needle U-100 (INSULIN SYRINGE 1CC/31GX5/16") 31G X 5/16" 1 ML  MISC See admin instructions.  ? acetaminophen (TYLENOL) 500 MG tablet OTC as directed.  ? Adalimumab 40 MG/0.8ML PSKT Inject 40 mg into the skin once a week.   ? ADVIL 200 MG tablet SMARTSIG:1 By Mouth  ? albuterol (PROVENTIL) (2.5 MG/3ML) 0.083% nebulizer solution Take 3 mLs (2.5 mg total) by nebulization every 4 (four) hours as needed for wheezing or shortness of breath.  ? albuterol (VENTOLIN HFA) 108 (90 Base) MCG/ACT inhaler Inhale 2 puffs into the lungs every 6 (six) hours as needed for wheezing or shortness of breath.  ? BD VEO INSULIN SYRINGE U/F 31G X 15/64" 1 ML MISC   ? Black Elderberry,Berry-Flower, 575 MG CAPS Take by mouth.  ? buPROPion (WELLBUTRIN XL) 300 MG 24 hr tablet Take 1 tablet by mouth once daily  ? carvedilol (COREG) 6.25 MG tablet Take 1 tablet (6.25 mg total) by mouth 2 (two) times daily with a meal.  ? Cholecalciferol (VITAMIN D3 PO) Take 1 capsule by mouth daily.  ? [employer] TRIPLE MAGNESIUM COMPLEX PO Take 1 capsule by mouth daily.  ? cyclobenzaprine (FLEXERIL) 10 MG tablet Take 1 tablet (10 mg total) by mouth 3 (three) times daily as needed for muscle spasms. Caution of sedation  ? D-5000 125 MCG (5000 UT) TABS SMARTSIG:1 By Mouth  ? diphenhydrAMINE (BENADRYL) 25 mg capsule Take 25 mg by mouth as needed. OTC as directed.  ? esomeprazole (NEXIUM) 20 MG capsule Take 1 capsule (20 mg  total) by mouth daily at 12 noon.  ? fluticasone (FLONASE) 50 MCG/ACT nasal spray Place 2 sprays into both nostrils daily.  ? folic acid (FOLVITE) 1 MG tablet Take 3 mg by mouth 4 (four) times a week.   ? glucose blood (ONETOUCH VERIO) test strip To check glucose bid and prn for diabetes type 2  ? HUMIRA PEN 40 MG/0.8ML PNKT SMARTSIG:1 SUB-Q  ? hydrochlorothiazide (HYDRODIURIL) 12.5 MG tablet SMARTSIG:1 By Mouth  ? hydrochlorothiazide (MICROZIDE) 12.5 MG capsule Take 1 capsule by mouth once daily  ? ketorolac (ACULAR) 0.5 % ophthalmic solution 1 drop 4 (four) times daily.  ? Lancets (ONETOUCH DELICA PLUS [id number])  MISC SMARTSIG:1 Lancet(s) Topical Twice Daily PRN  ? leucovorin (WELLCOVORIN) 5 MG tablet Take 5 mg by mouth 2 (two) times a week.  ? levothyroxine (SYNTHROID) 25 MCG tablet Take 1 tablet (25 mcg total) by mouth daily.  ? losartan (COZAAR) 50 MG tablet SMARTSIG:1 By Mouth  ? magnesium oxide (MAG-OX) 400 MG tablet Take 400 mg by mouth daily.  ? metFORMIN (GLUCOPHAGE) 1000 MG tablet TAKE 1 TABLET BY MOUTH TWICE DAILY WITH A MEAL  ? methocarbamol (ROBAXIN) 500 MG tablet Take 1 tablet (500 mg total) by mouth every 8 (eight) hours as needed for muscle spasms. Caution of sedation  ? Methotrexate 25 MG/ML SOSY Inject 25 mg into the skin once a week.  ? methotrexate 250 MG/10ML injection Inject into the skin.  ? metroNIDAZOLE (METROGEL VAGINAL) 0.75 % vaginal gel Place 1 Applicatorful vaginally at bedtime. (Patient not taking: Reported on 02/26/2021)  ? mometasone (ELOCON) 0.1 % cream Apply topically.  ? Multiple Vitamins-Minerals (EMERGEN-C IMMUNE PLUS PO) Take 50 mg by mouth 2 times daily at 12 noon and 4 pm.  ? naproxen (NAPROSYN) 375 MG tablet Take 1 tablet (375 mg total) by mouth 2 (two) times daily as needed. With a meal  ? ondansetron (ZOFRAN-ODT) 4 MG disintegrating tablet Take 1 tablet (4 mg total) by mouth every 8 (eight) hours as needed for nausea or vomiting.  ? prednisoLONE acetate (PRED FORTE) 1 % ophthalmic suspension Place 4 drops into both eyes daily.  ? promethazine-dextromethorphan (PROMETHAZINE-DM) 6.25-15 MG/5ML syrup Take 5 mLs by mouth at bedtime as needed for cough.  ? rosuvastatin (CRESTOR) 5 MG tablet Take 1 tablet (5 mg total) by mouth every other day.  ? SINGULAIR 10 MG tablet SMARTSIG:1 By Mouth  ? SYMBICORT 160-4.5 MCG/ACT inhaler Inhale 2 puffs into the lungs 2 (two) times daily.  ? SYMBICORT 80-4.5 MCG/ACT inhaler SMARTSIG:1 Via Inhaler Daily  ? TYLENOL 325 MG tablet SMARTSIG:1 By Mouth  ? valACYclovir (VALTREX) 1000 MG tablet Take 1 tablet (1,000 mg total) by mouth 3 (three) times daily.   ? ?No facility-administered encounter medications on file as of 05/14/2021.  ? ? ?Allergies (verified) ?Amlodipine, Azithromycin, Cetirizine hcl, Erythromycin, Escitalopram oxalate, Lisinopril, Loratadine, Nifedipine, Sertraline hcl, and Vyvanse [lisdexamfetamine dimesylate]  ? ?History: ?Past Medical History:  ?Diagnosis Date  ? Allergy   ? allergic rhinitis  ? Asthma   ? Chronic headaches   ? Degenerative disc disease   ? Depression   ? Diabetes mellitus without complication ([place])   ? Eczema   ? Fibromyalgia   ? Fx of fibula 2009  ? GERD (gastroesophageal reflux disease)   ? Hemorrhoids   ? Hypertension   ? IBS (irritable bowel syndrome)   ? Iritis, chronic   ? Macular hole of right eye   ? Sleep apnea   ? ?Past Surgical  History:  ?Procedure Laterality Date  ? ABDOMINAL HYSTERECTOMY    ? ANTERIOR CRUCIATE LIGAMENT REPAIR  1996  ? small tear of ACL  ? BREAST EXCISIONAL BIOPSY Left 1995  ? BREAST SURGERY    ? left nipple removed - benign tumor  ? BUNIONECTOMY    ? COLONOSCOPY    ? LAPAROSCOPY  08/2004  ? endometriosis - several   ? TONSILLECTOMY    ? ?Family History  ?Problem Relation Age of Onset  ? Hypertension Mother   ? Thyroid disease Mother   ? Fibromyalgia Mother   ? Colon polyps Mother   ? Diabetes Father   ? Kidney cancer Maternal Grandmother   ? Colon cancer Neg Hx   ? Esophageal cancer Neg Hx   ? Stomach cancer Neg Hx   ? Rectal cancer Neg Hx   ? Allergic rhinitis Neg Hx   ? Angioedema Neg Hx   ? Asthma Neg Hx   ? Atopy Neg Hx   ? Eczema Neg Hx   ? Immunodeficiency Neg Hx   ? Urticaria Neg Hx   ? ?Social History  ? ?Socioeconomic History  ? Marital status: Divorced  ?  Spouse name: Not on file  ? Number of children: 1  ? Years of education: Not on file  ? Highest education level: Not on file  ?Occupational History  ? Occupation: Disabled due to fibromyalgia  ?  Employer: UNEMPLOYED  ?Tobacco Use  ? Smoking status: Never  ? Smokeless tobacco: Never  ?Vaping Use  ? Vaping Use: Never used  ?Substance and  Sexual Activity  ? Alcohol use: Never  ?  Alcohol/week: 0.0 standard drinks  ? Drug use: No  ? Sexual activity: Never  ?Other Topics Concern  ? Not on file  ?Social History Narrative  ? 1 caffeine drink da

## 2021-05-14 NOTE — Patient Instructions (Signed)
[name] , ?Thank you for taking time to come for your Medicare Wellness Visit. I appreciate your ongoing commitment to your health goals. Please review the following plan we discussed and let me know if I can assist you in the future.  ? ?Screening recommendations/referrals: ?Colonoscopy: 09/09/18 ?Mammogram: referral sent ?Bone Density: n/d ?Recommended yearly ophthalmology/optometry visit for glaucoma screening and checkup ?Recommended yearly dental visit for hygiene and checkup ? ?Vaccinations: ?Influenza vaccine: n/d ?Pneumococcal vaccine: 11/02/12 ?Tdap vaccine: 07/20/11 ?Shingles vaccine: n/d  ?Covid-19: 09/08/19, 09/29/19, 03/20/20 ? ?Advanced directives: no ? ?Conditions/risks identified: none ? ?Next appointment: Follow up in one year for your annual wellness visit. - declined ? ?Preventive Care 40-64 Years, Female ?Preventive care refers to lifestyle choices and visits with your health care provider that can promote health and wellness. ?What does preventive care include? ?A yearly physical exam. This is also called an annual well check. ?Dental exams once or twice a year. ?Routine eye exams. Ask your health care provider how often you should have your eyes checked. ?Personal lifestyle choices, including: ?Daily care of your teeth and gums. ?Regular physical activity. ?Eating a healthy diet. ?Avoiding tobacco and drug use. ?Limiting alcohol use. ?Practicing safe sex. ?Taking low-dose aspirin daily starting at age 66. ?Taking vitamin and mineral supplements as recommended by your health care provider. ?What happens during an annual well check? ?The services and screenings done by your health care provider during your annual well check will depend on your age, overall health, lifestyle risk factors, and family history of disease. ?Counseling  ?Your health care provider may ask you questions about your: ?Alcohol use. ?Tobacco use. ?Drug use. ?Emotional well-being. ?Home and relationship well-being. ?Sexual  activity. ?Eating habits. ?Work and work Statistician. ?Method of birth control. ?Menstrual cycle. ?Pregnancy history. ?Screening  ?You may have the following tests or measurements: ?Height, weight, and BMI. ?Blood pressure. ?Lipid and cholesterol levels. These may be checked every 5 years, or more frequently if you are over 16 years old. ?Skin check. ?Lung cancer screening. You may have this screening every year starting at age 39 if you have a 30-pack-year history of smoking and currently smoke or have quit within the past 15 years. ?Fecal occult blood test (FOBT) of the stool. You may have this test every year starting at age 30. ?Flexible sigmoidoscopy or colonoscopy. You may have a sigmoidoscopy every 5 years or a colonoscopy every 10 years starting at age 67. ?Hepatitis C blood test. ?Hepatitis B blood test. ?Sexually transmitted disease (STD) testing. ?Diabetes screening. This is done by checking your blood sugar (glucose) after you have not eaten for a while (fasting). You may have this done every 1-3 years. ?Mammogram. This may be done every 1-2 years. Talk to your health care provider about when you should start having regular mammograms. This may depend on whether you have a family history of breast cancer. ?BRCA-related cancer screening. This may be done if you have a family history of breast, ovarian, tubal, or peritoneal cancers. ?Pelvic exam and Pap test. This may be done every 3 years starting at age 16. Starting at age 84, this may be done every 5 years if you have a Pap test in combination with an HPV test. ?Bone density scan. This is done to screen for osteoporosis. You may have this scan if you are at high risk for osteoporosis. ?Discuss your test results, treatment options, and if necessary, the need for more tests with your health care provider. ?Vaccines  ?Your health  care provider may recommend certain vaccines, such as: ?Influenza vaccine. This is recommended every year. ?Tetanus, diphtheria,  and acellular pertussis (Tdap, Td) vaccine. You may need a Td booster every 10 years. ?Zoster vaccine. You may need this after age 64. ?Pneumococcal 13-valent conjugate (PCV13) vaccine. You may need this if you have certain conditions and were not previously vaccinated. ?Pneumococcal polysaccharide (PPSV23) vaccine. You may need one or two doses if you smoke cigarettes or if you have certain conditions. ?Talk to your health care provider about which screenings and vaccines you need and how often you need them. ?This information is not intended to replace advice given to you by your health care provider. Make sure you discuss any questions you have with your health care provider. ?Document Released: 01/18/2015 Document Revised: 09/11/2015 Document Reviewed: 10/23/2014 ?Elsevier Interactive Patient Education ? 2017 [employer] ? ? ? ?Fall Prevention in the Home ?Falls can cause injuries. They can happen to people of all ages. There are many things you can do to make your home safe and to help prevent falls. ?What can I do on the outside of my home? ?Regularly fix the edges of walkways and driveways and fix any cracks. ?Remove anything that might make you trip as you walk through a door, such as a raised step or threshold. ?Trim any bushes or trees on the path to your home. ?Use bright outdoor lighting. ?Clear any walking paths of anything that might make someone trip, such as rocks or tools. ?Regularly check to see if handrails are loose or broken. Make sure that both sides of any steps have handrails. ?Any raised decks and porches should have guardrails on the edges. ?Have any leaves, snow, or ice cleared regularly. ?Use sand or salt on walking paths during winter. ?Clean up any spills in your garage right away. This includes oil or grease spills. ?What can I do in the bathroom? ?Use night lights. ?Install grab bars by the toilet and in the tub and shower. Do not use towel bars as grab bars. ?Use non-skid mats or  decals in the tub or shower. ?If you need to sit down in the shower, use a plastic, non-slip stool. ?Keep the floor dry. Clean up any water that spills on the floor as soon as it happens. ?Remove soap buildup in the tub or shower regularly. ?Attach bath mats securely with double-sided non-slip rug tape. ?Do not have throw rugs and other things on the floor that can make you trip. ?What can I do in the bedroom? ?Use night lights. ?Make sure that you have a light by your bed that is easy to reach. ?Do not use any sheets or blankets that are too big for your bed. They should not hang down onto the floor. ?Have a firm chair that has side arms. You can use this for support while you get dressed. ?Do not have throw rugs and other things on the floor that can make you trip. ?What can I do in the kitchen? ?Clean up any spills right away. ?Avoid walking on wet floors. ?Keep items that you use a lot in easy-to-reach places. ?If you need to reach something above you, use a strong step stool that has a grab bar. ?Keep electrical cords out of the way. ?Do not use floor polish or wax that makes floors slippery. If you must use wax, use non-skid floor wax. ?Do not have throw rugs and other things on the floor that can make you trip. ?What can I  do with my stairs? ?Do not leave any items on the stairs. ?Make sure that there are handrails on both sides of the stairs and use them. Fix handrails that are broken or loose. Make sure that handrails are as long as the stairways. ?Check any carpeting to make sure that it is firmly attached to the stairs. Fix any carpet that is loose or worn. ?Avoid having throw rugs at the top or bottom of the stairs. If you do have throw rugs, attach them to the floor with carpet tape. ?Make sure that you have a light switch at the top of the stairs and the bottom of the stairs. If you do not have them, ask someone to add them for you. ?What else can I do to help prevent falls? ?Wear shoes that: ?Do not  have high heels. ?Have rubber bottoms. ?Are comfortable and fit you well. ?Are closed at the toe. Do not wear sandals. ?If you use a stepladder: ?Make sure that it is fully opened. Do not climb a closed ste

## 2021-05-22 ENCOUNTER — Ambulatory Visit (INDEPENDENT_AMBULATORY_CARE_PROVIDER_SITE_OTHER): Payer: Medicare HMO | Admitting: Family Medicine

## 2021-05-22 ENCOUNTER — Encounter: Payer: Self-pay | Admitting: Family Medicine

## 2021-05-22 VITALS — BP 148/95 | HR 83 | Temp 97.2°F | Ht 68.0 in | Wt 206.4 lb

## 2021-05-22 DIAGNOSIS — H00012 Hordeolum externum right lower eyelid: Secondary | ICD-10-CM | POA: Diagnosis not present

## 2021-05-22 DIAGNOSIS — H00019 Hordeolum externum unspecified eye, unspecified eyelid: Secondary | ICD-10-CM | POA: Insufficient documentation

## 2021-05-22 DIAGNOSIS — R7989 Other specified abnormal findings of blood chemistry: Secondary | ICD-10-CM | POA: Insufficient documentation

## 2021-05-22 DIAGNOSIS — I10 Essential (primary) hypertension: Secondary | ICD-10-CM | POA: Diagnosis not present

## 2021-05-22 MED ORDER — FLUCONAZOLE 150 MG PO TABS: 150.0000 mg | ORAL_TABLET | Freq: Once | ORAL | 0 refills | Status: AC

## 2021-05-22 MED ORDER — ONETOUCH VERIO VI STRP: ORAL_STRIP | 3 refills | Status: DC

## 2021-05-22 MED ORDER — CEPHALEXIN 500 MG PO CAPS: 500.0000 mg | ORAL_CAPSULE | Freq: Three times a day (TID) | ORAL | 0 refills | Status: DC

## 2021-05-22 NOTE — Assessment & Plan Note (Signed)
BP: (!) 148/95    Unsure if it is white coat change Plans to re check at home and call back   Taking coreg 6.25 mg bid and hctz 12.5 mg daily  Unable to take ace/arb due to cough Unable to take amlodipine due to pedal edema

## 2021-05-22 NOTE — Progress Notes (Signed)
Subjective:    Patient ID: [name], female    DOB: Oct 11, 1965, 56 y.o.   MRN: [id number]  HPI Pt presents for stye on L eye   Wt Readings from Last 3 Encounters:  05/22/21 206 lb 6 oz (93.6 kg)  03/26/21 208 lb (94.3 kg)  10/07/20 208 lb (94.3 kg)   31.38 kg/m Has a stye (she thinks) Pain all the way down to her lower lid into the cheek  Thinks it started draining last night  Woke up crusty  Using packs   Vision is bad in that eye baseline/ but no more than that  No photophobia  No recent sinus trouble or ear infection   No insect bites No fb in eye  Not working outside   Bp is elevated also BP Readings from Last 3 Encounters:  05/22/21 (!) 148/95  03/27/21 (!) 164/95  02/26/21 (!) 148/94    Coreg 6.25 mg bid Hctz 12.5 mg daily  Not eating healthy  Not getting enough exercise   Ace and arb both caused cough in the past  Pulse Readings from Last 3 Encounters:  05/22/21 83  03/27/21 96  02/26/21 82     H/o iritis/uveitis  and macular hole/edema and pseudophakia of both eyes  Boes to [facility] and humira  (recently cut her dose)   Lab Results  Component Value Date   CREATININE 0.82 03/26/2021   BUN 20 03/26/2021   NA 136 03/26/2021   K 3.9 03/26/2021   CL 102 03/26/2021   CO2 20 (L) 03/26/2021     Was there may 5th -had just had a stye and that one got better    prednisolone oph susp (she does use that and ketorolac)    Has had elevated AST at ophy office at 81   Other labs were? Nl  Needs referral for ultrasound  No abd pain or GI symptoms   Patient Active Problem List   Diagnosis Date Noted   Elevated LFTs 05/22/2021   Hordeolum 05/22/2021   Sleep apnea 05/14/2021   Class 1 obesity due to excess calories with body mass index (BMI) of 32.0 to 32.9 in adult 07/10/2020   ETD (eustachian tube dysfunction) 10/09/2019   Hyperlipidemia associated with type 2 diabetes mellitus ([place]) 07/10/2019   Cystoid macular edema of both  eyes 06/16/2019   Vitamin D deficiency 06/16/2019   Type 2 diabetes mellitus with hyperglycemia ([place]) 06/06/2019   Exposure to COVID-19 virus 05/17/2019   Peripheral edema 10/30/2017   Moderate persistent asthma, uncomplicated 02/77/4128   Chronic rhinitis 08/10/2017   Cough 06/06/2017   Welcome to Medicare preventive visit 04/20/2017   Chronic low back pain 12/09/2015   Degeneration of lumbar or lumbosacral intervertebral disc 12/09/2015   Hypothyroid 01/24/2014   Pseudophakia of both eyes 12/15/2013   Nasal septal deviation 01/03/2013   Encounter for Medicare annual wellness exam 11/02/2012   Hydradenitis 11/02/2012   Cystoid macular degeneration of retina 05/13/2012   Macular hole 03/23/2012   Screening-pulmonary TB 03/23/2012   Routine general medical examination at a health care facility 10/02/2011   Iritis, recurrent 10/02/2011   Tachycardia 10/02/2011   ADD (attention deficit disorder) 08/20/2010   DYSHIDROTIC ECZEMA, HANDS 11/12/2009   Obstructive sleep apnea 11/19/2006   Depression with anxiety 09/29/2006   Essential hypertension 09/29/2006   Seasonal and perennial allergic rhinitis 09/29/2006   Asthma, mild intermittent, well-controlled 09/29/2006   GERD 09/29/2006   IBS 09/29/2006   ENDOMETRIOSIS 09/29/2006  FEMALE INFERTILITY 09/29/2006   Fibromyalgia 09/29/2006   INSOMNIA 09/29/2006   Past Medical History:  Diagnosis Date   Allergy    allergic rhinitis   Asthma    Chronic headaches    Degenerative disc disease    Depression    Diabetes mellitus without complication ([place])    Eczema    Fibromyalgia    Fx of fibula 2009   GERD (gastroesophageal reflux disease)    Hemorrhoids    Hypertension    IBS (irritable bowel syndrome)    Iritis, chronic    Macular hole of right eye    Sleep apnea    Past Surgical History:  Procedure Laterality Date   ABDOMINAL HYSTERECTOMY     ANTERIOR CRUCIATE LIGAMENT REPAIR  1996   small tear of ACL   BREAST EXCISIONAL  BIOPSY Left 1995   BREAST SURGERY     left nipple removed - benign tumor   BUNIONECTOMY     COLONOSCOPY     LAPAROSCOPY  08/2004   endometriosis - several    TONSILLECTOMY     Social History   Tobacco Use   Smoking status: Never   Smokeless tobacco: Never  Vaping Use   Vaping Use: Never used  Substance Use Topics   Alcohol use: Never    Alcohol/week: 0.0 standard drinks   Drug use: No   Family History  Problem Relation Age of Onset   Hypertension Mother    Thyroid disease Mother    Fibromyalgia Mother    Colon polyps Mother    Diabetes Father    Kidney cancer Maternal Grandmother    Colon cancer Neg Hx    Esophageal cancer Neg Hx    Stomach cancer Neg Hx    Rectal cancer Neg Hx    Allergic rhinitis Neg Hx    Angioedema Neg Hx    Asthma Neg Hx    Atopy Neg Hx    Eczema Neg Hx    Immunodeficiency Neg Hx    Urticaria Neg Hx    Allergies  Allergen Reactions   Amlodipine     Pedal edema    Cetirizine Hcl     REACTION: not effective   Erythromycin     REACTION: stomach hurts   Escitalopram Oxalate     REACTION: no improvement   Lisinopril     REACTION: cough   Loratadine     REACTION: not effective   Nifedipine     REACTION: fatigued and tremor   Sertraline Hcl     REACTION: no help   Vyvanse [Lisdexamfetamine Dimesylate]     Headache and excitablility   Current Outpatient Medications on File Prior to Visit  Medication Sig Dispense Refill   acetaminophen (TYLENOL) 500 MG tablet OTC as directed.     Adalimumab 40 MG/0.8ML PSKT Inject 40 mg into the skin once a week.      ADVIL 200 MG tablet SMARTSIG:1 By Mouth     albuterol (PROVENTIL) (2.5 MG/3ML) 0.083% nebulizer solution Take 3 mLs (2.5 mg total) by nebulization every 4 (four) hours as needed for wheezing or shortness of breath. 75 mL 1   albuterol (VENTOLIN HFA) 108 (90 Base) MCG/ACT inhaler Inhale 2 puffs into the lungs every 6 (six) hours as needed for wheezing or shortness of breath. 18 g 1   BD  VEO INSULIN SYRINGE U/F 31G X 15/64" 1 ML MISC      Black Elderberry,Berry-Flower, 575 MG CAPS Take by mouth.     buPROPion (  WELLBUTRIN XL) 300 MG 24 hr tablet Take 1 tablet by mouth once daily 90 tablet 3   carvedilol (COREG) 6.25 MG tablet Take 1 tablet (6.25 mg total) by mouth 2 (two) times daily with a meal. 180 tablet 3   Cholecalciferol (VITAMIN D3 PO) Take 1 capsule by mouth daily.     CVS TRIPLE MAGNESIUM COMPLEX PO Take 1 capsule by mouth daily.     cyclobenzaprine (FLEXERIL) 10 MG tablet Take 1 tablet (10 mg total) by mouth 3 (three) times daily as needed for muscle spasms. Caution of sedation 30 tablet 0   D-5000 125 MCG (5000 UT) TABS SMARTSIG:1 By Mouth     diphenhydrAMINE (BENADRYL) 25 mg capsule Take 25 mg by mouth as needed. OTC as directed.     esomeprazole (NEXIUM) 20 MG capsule Take 1 capsule (20 mg total) by mouth daily at 12 noon. 90 capsule 3   fluticasone (FLONASE) 50 MCG/ACT nasal spray Place 2 sprays into both nostrils daily.     folic acid (FOLVITE) 1 MG tablet Take 3 mg by mouth 4 (four) times a week.      HUMIRA PEN 40 MG/0.8ML PNKT SMARTSIG:1 SUB-Q     hydrochlorothiazide (MICROZIDE) 12.5 MG capsule Take 1 capsule by mouth once daily 90 capsule 0   Insulin Syringe-Needle U-100 (INSULIN SYRINGE 1CC/31GX5/16") 31G X 5/16" 1 ML MISC See admin instructions.     ketorolac (ACULAR) 0.5 % ophthalmic solution 1 drop 4 (four) times daily.     Lancets (ONETOUCH DELICA PLUS [id number]) MISC SMARTSIG:1 Lancet(s) Topical Twice Daily PRN     leucovorin (WELLCOVORIN) 5 MG tablet Take 5 mg by mouth 2 (two) times a week.     levothyroxine (SYNTHROID) 25 MCG tablet Take 1 tablet (25 mcg total) by mouth daily. 90 tablet 3   magnesium oxide (MAG-OX) 400 MG tablet Take 400 mg by mouth daily.     metFORMIN (GLUCOPHAGE) 1000 MG tablet TAKE 1 TABLET BY MOUTH TWICE DAILY WITH A MEAL 180 tablet 1   methocarbamol (ROBAXIN) 500 MG tablet Take 1 tablet (500 mg total) by mouth every 8 (eight)  hours as needed for muscle spasms. Caution of sedation 30 tablet 1   Methotrexate 25 MG/ML SOSY Inject 25 mg into the skin once a week.     methotrexate 250 MG/10ML injection Inject into the skin.     mometasone (ELOCON) 0.1 % cream Apply topically.     Multiple Vitamins-Minerals (EMERGEN-C IMMUNE PLUS PO) Take 50 mg by mouth 2 times daily at 12 noon and 4 pm.     naproxen (NAPROSYN) 375 MG tablet Take 1 tablet (375 mg total) by mouth 2 (two) times daily as needed. With a meal 30 tablet 1   ondansetron (ZOFRAN-ODT) 4 MG disintegrating tablet Take 1 tablet (4 mg total) by mouth every 8 (eight) hours as needed for nausea or vomiting. 8 tablet 0   prednisoLONE acetate (PRED FORTE) 1 % ophthalmic suspension Place 4 drops into both eyes daily.     rosuvastatin (CRESTOR) 5 MG tablet Take 1 tablet (5 mg total) by mouth every other day. 45 tablet 3   SINGULAIR 10 MG tablet      SYMBICORT 160-4.5 MCG/ACT inhaler Inhale 2 puffs into the lungs 2 (two) times daily. 10.2 g 5   SYMBICORT 80-4.5 MCG/ACT inhaler SMARTSIG:1 Via Inhaler Daily     TYLENOL 325 MG tablet SMARTSIG:1 By Mouth     No current facility-administered medications on file prior to visit.  Review of Systems  Constitutional:  Negative for activity change, appetite change, fatigue, fever and unexpected weight change.  HENT:  Negative for congestion, ear pain, rhinorrhea, sinus pressure and sore throat.   Eyes:  Positive for discharge and visual disturbance. Negative for pain, redness and itching.  Respiratory:  Negative for cough, shortness of breath and wheezing.   Cardiovascular:  Negative for chest pain and palpitations.  Gastrointestinal:  Negative for abdominal pain, blood in stool, constipation and diarrhea.  Endocrine: Negative for polydipsia and polyuria.  Genitourinary:  Negative for dysuria, frequency and urgency.  Musculoskeletal:  Negative for arthralgias, back pain and myalgias.  Skin:  Negative for pallor and rash.   Allergic/Immunologic: Negative for environmental allergies.  Neurological:  Negative for dizziness, syncope and headaches.  Hematological:  Negative for adenopathy. Does not bruise/bleed easily.  Psychiatric/Behavioral:  Negative for decreased concentration and dysphoric mood. The patient is not nervous/anxious.       Objective:   Physical Exam Constitutional:      General: She is not in acute distress.    Appearance: Normal appearance. She is well-developed. She is not ill-appearing or diaphoretic.  HENT:     Head: Normocephalic and atraumatic.     Nose: Nose normal.     Mouth/Throat:     Mouth: Mucous membranes are moist.  Eyes:     General: No scleral icterus.       Right eye: No discharge.        Left eye: No discharge.     Extraocular Movements: Extraocular movements intact.     Conjunctiva/sclera: Conjunctivae normal.     Pupils: Pupils are equal, round, and reactive to light.     Comments: 2-3 mm papule with some white d/c noted on medial lower eyelid of the L eye Scant redness under this with swelling No streaking  Mildly tender   Nl EOMs No change in vision from baseline   Neck:     Thyroid: No thyromegaly.     Vascular: No carotid bruit or JVD.  Cardiovascular:     Rate and Rhythm: Normal rate and regular rhythm.     Heart sounds: Normal heart sounds.    No gallop.  Pulmonary:     Effort: Pulmonary effort is normal. No respiratory distress.     Breath sounds: Normal breath sounds. No wheezing or rales.  Abdominal:     General: Abdomen is protuberant. There is no distension or abdominal bruit.     Palpations: Abdomen is soft. There is no hepatomegaly, splenomegaly, mass or pulsatile mass.     Tenderness: There is no abdominal tenderness. There is no guarding or rebound. Negative signs include Murphy's sign and McBurney's sign.     Hernia: No hernia is present.  Musculoskeletal:     Cervical back: Normal range of motion and neck supple.     Right lower leg: No  edema.     Left lower leg: No edema.  Lymphadenopathy:     Cervical: No cervical adenopathy.  Skin:    General: Skin is warm and dry.     Coloration: Skin is not pale.     Findings: No rash.  Neurological:     Mental Status: She is alert.     Coordination: Coordination normal.     Deep Tendon Reflexes: Reflexes are normal and symmetric. Reflexes normal.  Psychiatric:        Mood and Affect: Mood normal.          Assessment & Plan:  Problem List Items Addressed This Visit       Cardiovascular and Mediastinum   Essential hypertension - Primary    BP: (!) 148/95    Unsure if it is white coat change Plans to re check at home and call back   Taking coreg 6.25 mg bid and hctz 12.5 mg daily  Unable to take ace/arb due to cough Unable to take amlodipine due to pedal edema         Other   Elevated LFTs    Mild elevation of AST at her oph office  On mtx and humira  Had addn labs done  Needs [place] of RUQ Ordered today       Relevant Orders   US ABDOMEN LIMITED RUQ (LIVER/GB)   Hordeolum    R medial lower lid Looks to have burst and draining  Significant pain over cheek in setting of high risk medications for auto imm disease  Also topical prenisolone (? If this could make styles more likely)  Px keflex for this given the above  Watch carefully for s/s of cellulitis or any vision change She will update her ophy specialist as well  Home care discussed

## 2021-05-22 NOTE — Patient Instructions (Addendum)
Check your blood pressure at home when relaxed several times daily /when you can  Notify us in the next week with how it is   Keep the area clean with soap and water (baby shampoo)  Warm compressed Wash your hands /keep clean    If symptoms worsen or fever or redness in face or vision change let us know asap   Let you eye doctor know as well

## 2021-05-22 NOTE — Assessment & Plan Note (Signed)
Mild elevation of AST at her oph office  On mtx and humira  Had addn labs done  Needs [place] of RUQ Ordered today

## 2021-05-22 NOTE — Assessment & Plan Note (Signed)
R medial lower lid Looks to have burst and draining  Significant pain over cheek in setting of high risk medications for auto imm disease  Also topical prenisolone (? If this could make styles more likely)  Px keflex for this given the above  Watch carefully for s/s of cellulitis or any vision change She will update her ophy specialist as well  Home care discussed

## 2021-06-04 ENCOUNTER — Ambulatory Visit
Admission: RE | Admit: 2021-06-04 | Discharge: 2021-06-04 | Disposition: A | Discharge status: Home / Self Care | Payer: Medicare HMO | Source: Ambulatory Visit | Attending: Family Medicine | Admitting: Family Medicine

## 2021-06-04 DIAGNOSIS — R7989 Other specified abnormal findings of blood chemistry: Secondary | ICD-10-CM

## 2021-06-04 DIAGNOSIS — R945 Abnormal results of liver function studies: Secondary | ICD-10-CM | POA: Diagnosis not present

## 2021-06-05 ENCOUNTER — Telehealth: Payer: Self-pay | Admitting: *Deleted

## 2021-06-05 NOTE — Telephone Encounter (Signed)
Left VM requesting pt to call the office back 

## 2021-06-05 NOTE — Telephone Encounter (Signed)
-----   Message from Abner Greenspan, MD sent at 06/04/2021  8:12 PM EDT ----- Ultrasound notes changes consistent with fatty liver  Weight loss is important to get this under control and prevent liver problems  Please work on healthy diet and exercise as able  I would like to re check liver tests here in 2-3 weeks  Please send copy to her ophy doctor

## 2021-06-22 ENCOUNTER — Telehealth: Payer: Self-pay | Admitting: Family Medicine

## 2021-06-22 DIAGNOSIS — R7989 Other specified abnormal findings of blood chemistry: Secondary | ICD-10-CM

## 2021-06-22 NOTE — Telephone Encounter (Signed)
-----   Message from Ellamae Sia sent at 06/09/2021 10:52 AM EDT ----- Regarding: lab orders for Monday, 6.19.23 Lab orders for liver? thanks

## 2021-06-23 ENCOUNTER — Other Ambulatory Visit (INDEPENDENT_AMBULATORY_CARE_PROVIDER_SITE_OTHER): Payer: Medicare HMO

## 2021-06-23 DIAGNOSIS — R7989 Other specified abnormal findings of blood chemistry: Secondary | ICD-10-CM

## 2021-06-23 LAB — HEPATIC FUNCTION PANEL
ALT: 23 U/L (ref 0–35)
AST: 47 U/L — ABNORMAL HIGH (ref 0–37)
Albumin: 4.2 g/dL (ref 3.5–5.2)
Alkaline Phosphatase: 85 U/L (ref 39–117)
Bilirubin, Direct: 0.2 mg/dL (ref 0.0–0.3)
Total Bilirubin: 0.8 mg/dL (ref 0.2–1.2)
Total Protein: 7.4 g/dL (ref 6.0–8.3)

## 2021-07-02 ENCOUNTER — Emergency Department (HOSPITAL_COMMUNITY)
Admission: EM | Admit: 2021-07-02 | Discharge: 2021-07-02 | Disposition: A | Discharge status: Home / Self Care | Payer: Worker's Compensation | Attending: Emergency Medicine | Admitting: Emergency Medicine

## 2021-07-02 ENCOUNTER — Other Ambulatory Visit: Payer: Self-pay

## 2021-07-02 ENCOUNTER — Emergency Department (HOSPITAL_COMMUNITY): Payer: Worker's Compensation

## 2021-07-02 ENCOUNTER — Encounter (HOSPITAL_COMMUNITY): Payer: Self-pay

## 2021-07-02 DIAGNOSIS — S82201A Unspecified fracture of shaft of right tibia, initial encounter for closed fracture: Secondary | ICD-10-CM | POA: Diagnosis not present

## 2021-07-02 DIAGNOSIS — M7989 Other specified soft tissue disorders: Secondary | ICD-10-CM | POA: Diagnosis not present

## 2021-07-02 DIAGNOSIS — S82144A Nondisplaced bicondylar fracture of right tibia, initial encounter for closed fracture: Secondary | ICD-10-CM | POA: Diagnosis not present

## 2021-07-02 DIAGNOSIS — R739 Hyperglycemia, unspecified: Secondary | ICD-10-CM | POA: Diagnosis not present

## 2021-07-02 DIAGNOSIS — I10 Essential (primary) hypertension: Secondary | ICD-10-CM | POA: Diagnosis not present

## 2021-07-02 DIAGNOSIS — R102 Pelvic and perineal pain: Secondary | ICD-10-CM | POA: Diagnosis not present

## 2021-07-02 DIAGNOSIS — M25552 Pain in left hip: Secondary | ICD-10-CM | POA: Diagnosis not present

## 2021-07-02 DIAGNOSIS — S82141A Displaced bicondylar fracture of right tibia, initial encounter for closed fracture: Secondary | ICD-10-CM

## 2021-07-02 DIAGNOSIS — M25561 Pain in right knee: Secondary | ICD-10-CM | POA: Diagnosis not present

## 2021-07-02 DIAGNOSIS — Z743 Need for continuous supervision: Secondary | ICD-10-CM | POA: Diagnosis not present

## 2021-07-02 DIAGNOSIS — R9431 Abnormal electrocardiogram [ECG] [EKG]: Secondary | ICD-10-CM | POA: Diagnosis not present

## 2021-07-02 DIAGNOSIS — M545 Low back pain, unspecified: Secondary | ICD-10-CM | POA: Diagnosis not present

## 2021-07-02 DIAGNOSIS — S8991XA Unspecified injury of right lower leg, initial encounter: Secondary | ICD-10-CM | POA: Diagnosis present

## 2021-07-02 DIAGNOSIS — D72829 Elevated white blood cell count, unspecified: Secondary | ICD-10-CM | POA: Diagnosis not present

## 2021-07-02 DIAGNOSIS — W010XXA Fall on same level from slipping, tripping and stumbling without subsequent striking against object, initial encounter: Secondary | ICD-10-CM | POA: Insufficient documentation

## 2021-07-02 DIAGNOSIS — R52 Pain, unspecified: Secondary | ICD-10-CM | POA: Diagnosis not present

## 2021-07-02 DIAGNOSIS — R609 Edema, unspecified: Secondary | ICD-10-CM | POA: Diagnosis not present

## 2021-07-02 LAB — BASIC METABOLIC PANEL
Anion gap: 11 (ref 5–15)
BUN: 17 mg/dL (ref 6–20)
CO2: 22 mmol/L (ref 22–32)
Calcium: 9.1 mg/dL (ref 8.9–10.3)
Chloride: 103 mmol/L (ref 98–111)
Creatinine, Ser: 0.87 mg/dL (ref 0.44–1.00)
GFR, Estimated: >60 mL/min (ref >60)
Glucose, Bld: 140 mg/dL — ABNORMAL HIGH (ref 70–99)
Potassium: 4.2 mmol/L (ref 3.5–5.1)
Sodium: 136 mmol/L (ref 135–145)

## 2021-07-02 LAB — CBC WITH DIFFERENTIAL/PLATELET
Abs Immature Granulocytes: 0.06 10*3/uL (ref 0.00–0.07)
Basophils Absolute: 0.1 10*3/uL (ref 0.0–0.1)
Basophils Relative: 1 %
Eosinophils Absolute: 0.4 10*3/uL (ref 0.0–0.5)
Eosinophils Relative: 3 %
HCT: 42.9 % (ref 36.0–46.0)
Hemoglobin: 14.2 g/dL (ref 12.0–15.0)
Immature Granulocytes: 0 %
Lymphocytes Relative: 22 %
Lymphs Abs: 3 10*3/uL (ref 0.7–4.0)
MCH: 29.8 pg (ref 26.0–34.0)
MCHC: 33.1 g/dL (ref 30.0–36.0)
MCV: 89.9 fL (ref 80.0–100.0)
Monocytes Absolute: 0.7 10*3/uL (ref 0.1–1.0)
Monocytes Relative: 5 %
Neutro Abs: 9.7 10*3/uL — ABNORMAL HIGH (ref 1.7–7.7)
Neutrophils Relative %: 69 %
Platelets: 259 10*3/uL (ref 150–400)
RBC: 4.77 MIL/uL (ref 3.87–5.11)
RDW: 14.6 % (ref 11.5–15.5)
WBC: 14.1 10*3/uL — ABNORMAL HIGH (ref 4.0–10.5)
nRBC: 0 % (ref 0.0–0.2)

## 2021-07-02 MED ORDER — ONDANSETRON HCL 4 MG/2ML IJ SOLN
4.0000 mg | Freq: Once | INTRAMUSCULAR | Status: AC
  Administered 2021-07-02: 4 mg via INTRAVENOUS
  Filled 2021-07-02: qty 2

## 2021-07-02 MED ORDER — HYDROMORPHONE HCL 1 MG/ML IJ SOLN
0.5000 mg | Freq: Once | INTRAMUSCULAR | Status: AC
  Administered 2021-07-02: 0.5 mg via INTRAVENOUS
  Filled 2021-07-02: qty 0.5

## 2021-07-02 MED ORDER — OXYCODONE-ACETAMINOPHEN 5-325 MG PO TABS: 1.0000 | ORAL_TABLET | ORAL | 0 refills | Status: DC | PRN

## 2021-07-02 NOTE — Discharge Instructions (Signed)
Keep the knee immobilizer in place.  The Ace wrap will help with the swelling of your knee.  NO WEIGHTBEARING OF YOUR RIGHT LEG.  Use the crutches or a wheelchair.  You may call Dr. Doreatha Martin office to arrange an appointment to be seen on Tuesday.

## 2021-07-02 NOTE — ED Notes (Signed)
Pt states her pain is level 8. Will advise RN

## 2021-07-02 NOTE — ED Provider Notes (Signed)
[facility] EMERGENCY DEPARTMENT Provider Note   CSN: [id number] Arrival date & time: 07/02/21  1158     History  Chief Complaint  Patient presents with   [name]    [name] is a 56 y.o. female.   Fall Pertinent negatives include no chest pain and no headaches.      [name] is a 56 y.o. female who presents to the Emergency Department complaining of mechanical fall that occurred earlier today.  She states that she slipped and fell on a wet floor.  Believes that she "did a split with my right knee under me" notes immediate swelling to the lateral knee and upper portion of the lower leg.  Unable to bear weight.  She denies any numbness or tingling of her foot or toes.  She also has some pain of her lower right back, but states she did not fall on her back.  She denies head injury or LOC.  No numbness of her lower extremities.  Home Medications Prior to Admission medications   Medication Sig Start Date End Date Taking? Authorizing Provider  acetaminophen (TYLENOL) 500 MG tablet OTC as directed.    [provider]  Adalimumab 40 MG/0.8ML PSKT Inject 40 mg into the skin once a week.     [provider]  ADVIL 200 MG tablet SMARTSIG:1 By Mouth 04/30/21   [provider]  albuterol (PROVENTIL) (2.5 MG/3ML) 0.083% nebulizer solution Take 3 mLs (2.5 mg total) by nebulization every 4 (four) hours as needed for wheezing or shortness of breath. 02/26/21   Valentina Shaggy, MD  albuterol (VENTOLIN HFA) 108 (90 Base) MCG/ACT inhaler Inhale 2 puffs into the lungs every 6 (six) hours as needed for wheezing or shortness of breath. 02/26/21   Valentina Shaggy, MD  BD VEO INSULIN SYRINGE U/F 31G X 15/64" 1 ML MISC  05/12/21   [provider]  Black Elderberry,Berry-Flower, 575 MG CAPS Take by mouth. 04/30/21   [provider]  buPROPion (WELLBUTRIN XL) 300 MG 24 hr tablet Take 1 tablet by mouth once daily 03/24/21   Tower, Wynelle Fanny, MD   carvedilol (COREG) 6.25 MG tablet Take 1 tablet (6.25 mg total) by mouth 2 (two) times daily with a meal. 10/07/20   Tower, Wynelle Fanny, MD  cephALEXin (KEFLEX) 500 MG capsule Take 1 capsule (500 mg total) by mouth 3 (three) times daily. 05/22/21   Tower, Wynelle Fanny, MD  Cholecalciferol (VITAMIN D3 PO) Take 1 capsule by mouth daily.    [provider]  [employer] TRIPLE MAGNESIUM COMPLEX PO Take 1 capsule by mouth daily.    [provider]  cyclobenzaprine (FLEXERIL) 10 MG tablet Take 1 tablet (10 mg total) by mouth 3 (three) times daily as needed for muscle spasms. Caution of sedation 11/16/18   Tower, Wynelle Fanny, MD  D-5000 125 MCG (5000 UT) TABS SMARTSIG:1 By Mouth 04/30/21   [provider]  diphenhydrAMINE (BENADRYL) 25 mg capsule Take 25 mg by mouth as needed. OTC as directed.    [provider]  esomeprazole (NEXIUM) 20 MG capsule Take 1 capsule (20 mg total) by mouth daily at 12 noon. 04/22/16   Tower, Wynelle Fanny, MD  fluticasone (FLONASE) 50 MCG/ACT nasal spray Place 2 sprays into both nostrils daily.    [provider]  folic acid (FOLVITE) 1 MG tablet Take 3 mg by mouth 4 (four) times a week.     [provider]  glucose blood (ONETOUCH VERIO)  test strip To check glucose daily and prn for diabetes type 2 05/22/21   Tower, Wynelle Fanny, MD  HUMIRA PEN 40 MG/0.8ML PNKT SMARTSIG:1 SUB-Q 04/30/21   [provider]  hydrochlorothiazide (MICROZIDE) 12.5 MG capsule Take 1 capsule by mouth once daily 01/13/21   Tower, Wynelle Fanny, MD  Insulin Syringe-Needle U-100 (INSULIN SYRINGE 1CC/31GX5/16") 31G X 5/16" 1 ML MISC See admin instructions. 05/09/21 05/09/22  [provider]  ketorolac (ACULAR) 0.5 % ophthalmic solution 1 drop 4 (four) times daily. 11/24/18   [provider]  Lancets (ONETOUCH DELICA PLUS [id number]) [place] SMARTSIG:1 Lancet(s) Topical Twice Daily PRN 08/22/19   [provider]  leucovorin (WELLCOVORIN) 5 MG tablet Take 5 mg by mouth 2  (two) times a week.    [provider]  levothyroxine (SYNTHROID) 25 MCG tablet Take 1 tablet (25 mcg total) by mouth daily. 07/10/20   Tower, Wynelle Fanny, MD  magnesium oxide (MAG-OX) 400 MG tablet Take 400 mg by mouth daily.    [provider]  metFORMIN (GLUCOPHAGE) 1000 MG tablet TAKE 1 TABLET BY MOUTH TWICE DAILY WITH A MEAL 10/03/20   Tower, Wynelle Fanny, MD  methocarbamol (ROBAXIN) 500 MG tablet Take 1 tablet (500 mg total) by mouth every 8 (eight) hours as needed for muscle spasms. Caution of sedation 10/07/20   Tower, Wynelle Fanny, MD  Methotrexate 25 MG/ML SOSY Inject 25 mg into the skin once a week.    [provider]  methotrexate 250 MG/10ML injection Inject into the skin. 04/30/21   [provider]  mometasone (ELOCON) 0.1 % cream Apply topically. 04/30/21   [provider]  Multiple Vitamins-Minerals (EMERGEN-C IMMUNE PLUS PO) Take 50 mg by mouth 2 times daily at 12 noon and 4 pm.    [provider]  naproxen (NAPROSYN) 375 MG tablet Take 1 tablet (375 mg total) by mouth 2 (two) times daily as needed. With a meal 10/07/20   Tower, Wynelle Fanny, MD  ondansetron (ZOFRAN-ODT) 4 MG disintegrating tablet Take 1 tablet (4 mg total) by mouth every 8 (eight) hours as needed for nausea or vomiting. 03/26/21   Davonna Belling, MD  prednisoLONE acetate (PRED FORTE) 1 % ophthalmic suspension Place 4 drops into both eyes daily.    [provider]  rosuvastatin (CRESTOR) 5 MG tablet Take 1 tablet (5 mg total) by mouth every other day. 07/10/20   Tower, Wynelle Fanny, MD  SINGULAIR 10 MG tablet  04/30/21   [provider]  SYMBICORT 160-4.5 MCG/ACT inhaler Inhale 2 puffs into the lungs 2 (two) times daily. 02/26/21   Valentina Shaggy, MD  SYMBICORT 80-4.5 MCG/ACT inhaler SMARTSIG:1 Via Inhaler Daily 04/30/21   [provider]  TYLENOL 325 MG tablet SMARTSIG:1 By Mouth 04/30/21   [provider]      Allergies    Amlodipine, Cetirizine hcl,  Erythromycin, Escitalopram oxalate, Lisinopril, Loratadine, Nifedipine, Sertraline hcl, and Vyvanse [lisdexamfetamine dimesylate]    Review of Systems   Review of Systems  Constitutional:  Negative for fever.  Cardiovascular:  Negative for chest pain.  Gastrointestinal:  Negative for nausea and vomiting.  Musculoskeletal:  Positive for arthralgias (Right knee pain and swelling) and back pain. Negative for neck pain.  Neurological:  Negative for dizziness, syncope, numbness and headaches.    Physical Exam Updated Vital Signs BP (!) 159/91 (BP Location: Left Arm)   Pulse 94   Temp 98.2 F (36.8 C) (Oral)   Resp 20   Ht '5\' 8"'$  (  1.727 m)   Wt 93 kg   LMP 08/05/2006   SpO2 100%   BMI 31.17 kg/m  Physical Exam Vitals and nursing note reviewed.  Constitutional:      Appearance: She is not ill-appearing or toxic-appearing.     Comments: Patient appears uncomfortable  HENT:     Head: Atraumatic.  Cardiovascular:     Rate and Rhythm: Normal rate.     Pulses: Normal pulses.  Pulmonary:     Effort: Pulmonary effort is normal.     Breath sounds: Normal breath sounds.  Chest:     Chest wall: No tenderness.  Abdominal:     Palpations: Abdomen is soft.     Tenderness: There is no abdominal tenderness.  Musculoskeletal:        General: Swelling, tenderness and signs of injury present.     Cervical back: Normal range of motion. No tenderness.     Lumbar back: Tenderness present. No bony tenderness. Normal range of motion.     Right knee: Swelling and bony tenderness present. No ecchymosis or lacerations. Decreased range of motion.     Comments: Moderate edema of the right knee and proximal right lower leg.  Tenderness to palpation over the lateral right knee.  Unable to perform range of motion of the knee due to level of pain.  No erythema or obvious bony deformity.  Compartments of the lower extremity are soft.  Good DP pulses bilaterally.  Mild tenderness of the right lumbar  paraspinal muscles.  No midline tenderness or bony step-offs.  Hip flexors extensors intact.  Skin:    General: Skin is warm.     Capillary Refill: Capillary refill takes less than 2 seconds.  Neurological:     General: No focal deficit present.     Mental Status: She is alert.     Sensory: No sensory deficit.     Motor: No weakness.     ED Results / Procedures / Treatments   Labs (all labs ordered are listed, but only abnormal results are displayed) Labs Reviewed  CBC WITH DIFFERENTIAL/PLATELET - Abnormal; Notable for the following components:      Result Value   WBC 14.1 (*)    Neutro Abs 9.7 (*)    All other components within normal limits  BASIC METABOLIC PANEL - Abnormal; Notable for the following components:   Glucose, Bld 140 (*)    All other components within normal limits    EKG None  Radiology CT Knee Right Wo Contrast  Result Date: 07/02/2021 CLINICAL DATA:  Knee trauma.  Tibial plateau fracture. EXAM: CT OF THE RIGHT KNEE WITHOUT CONTRAST TECHNIQUE: Multidetector CT imaging of the right knee was performed according to the standard protocol. Multiplanar CT image reconstructions were also generated. RADIATION DOSE REDUCTION: This exam was performed according to the departmental dose-optimization program which includes automated exposure control, adjustment of the mA and/or kV according to patient size and/or use of iterative reconstruction technique. COMPARISON:  Radiographs same date. FINDINGS: Bones/Joint/Cartilage There is a comminuted and moderately depressed intra-articular fracture of the lateral tibial plateau. This fracture is associated with up to 8 mm of depression of the articular surface centrally. Nondisplaced fracture extends into the proximal tibiofibular articulation. There is medial extension of nondisplaced fractures along the tibial spines with involvement of the medial tibial plateau, best seen on the coronal images. The fibular head, distal femur and  patella appear intact. There is a moderate sized lipohemarthrosis. Ligaments Suboptimally assessed by CT. Muscles and  Tendons The extensor mechanism is intact. No focal muscular abnormalities are identified. Soft tissues Moderate subcutaneous swelling anterolaterally in the proximal lower leg. No focal fluid collection, foreign body or soft tissue emphysema. IMPRESSION: 1. Moderately depressed and comminuted intra-articular fracture of the lateral tibial plateau as described. 2. Medial extension of a nondisplaced fractures across the tibial spines into the medial tibial plateau. 3. Moderate sized lipohemarthrosis. Electronically Signed   By: Richardean Sale M.D.   On: 07/02/2021 15:10   DG Pelvis 1-2 Views  Result Date: 07/02/2021 CLINICAL DATA:  Fall.  Bilateral hip pain. EXAM: PELVIS - 1-2 VIEW COMPARISON:  None Available. FINDINGS: There is no evidence of pelvic fracture or diastasis. No pelvic bone lesions are seen. Lower lumbar degenerative change. Scattered pelvic enthesopathy. IMPRESSION: No evidence of acute fracture or joint malalignment. Electronically Signed   By: Margaretha Sheffield M.D.   On: 07/02/2021 14:51   DG Knee Complete 4 Views Right  Result Date: 07/02/2021 CLINICAL DATA:  RIGHT knee pain and swelling post fall, slipped on water at charge EXAM: RIGHT KNEE - COMPLETE 4+ VIEW COMPARISON:  Compared to 09/29/2013 FINDINGS: Osseous demineralization. Depressed fracture involving the RIGHT lateral tibial plateau. Associated genu valgus. No additional fracture, dislocation, or bone destruction. Regional soft tissue swelling. Joint effusion with fat/fluid level. IMPRESSION: Depressed fracture of the RIGHT lateral tibial plateau with associated lipohemarthrosis. Electronically Signed   By: Lavonia Dana M.D.   On: 07/02/2021 13:02      Procedures Procedures    Medications Ordered in ED Medications  HYDROmorphone (DILAUDID) injection 0.5 mg (has no administration in time range)    ED  Course/ Medical Decision Making/ A&P                          Medical Decision Making Patient here for evaluation of mechanical fall with injury of the right knee.  No reported head injury or LOC.  On exam, patient has diffuse tenderness of the right knee.  There is significant edema of the anterior knee and proximal right lower leg.  Compartments are soft.  Neurovascularly intact.  No bony deformity on exam but patient unable to perform range of motion due to level of pain right hip and ankle are nontender.  Differential would include internal derangement versus fracture  Amount and/or Complexity of Data Reviewed Labs: ordered.    Details: Labs interpreted by me, white count 14,000, felt this is secondary to trauma and likely unrelated to infectious process.  Chemistries mildly elevated blood sugar otherwise unremarkable Radiology: ordered. Decision-making details documented in ED Course.    Details: X-ray of the right knee shows depressed right lateral tibial plateau fracture with lipohemarthrosis. Discussion of management or test interpretation with external provider(s): Discussed x-ray findings with Hilbert Odor, PA-C.  He recommends Ace wrap to the knee, knee immobilizer, CT of the knee and nonweightbearing.  Patient to see orthopedics, Dr. Doreatha Martin on Tuesday.  Patient agreeable to treatment plan  On recheck, patient has required 3 doses of IV pain medication, but now resting comfortably.  Ace wrap and knee immobilizer applied by nursing staff.  Remains neurovascularly intact.  Appears appropriate for discharge home family members at bedside  Risk Prescription drug management.           Final Clinical Impression(s) / ED Diagnoses Final diagnoses:  Closed fracture of right tibial plateau, initial encounter    Rx / DC Orders ED Discharge Orders     None  Kem Parkinson, PA-C 07/04/21 1358    Milton Ferguson, MD 07/04/21 [phone]

## 2021-07-02 NOTE — ED Triage Notes (Signed)
[place] and [place] was a [place] leak and she fell . C/O right knee and lower back pain. Unable to bear weight on right leg.

## 2021-07-03 ENCOUNTER — Encounter: Payer: Self-pay | Admitting: Family Medicine

## 2021-07-04 ENCOUNTER — Ambulatory Visit
Admission: EM | Admit: 2021-07-04 | Discharge: 2021-07-04 | Disposition: A | Discharge status: Home / Self Care | Payer: Worker's Compensation

## 2021-07-04 ENCOUNTER — Encounter (HOSPITAL_COMMUNITY): Payer: Self-pay | Admitting: Emergency Medicine

## 2021-07-04 ENCOUNTER — Other Ambulatory Visit: Payer: Self-pay

## 2021-07-04 ENCOUNTER — Emergency Department (HOSPITAL_COMMUNITY)
Admission: EM | Admit: 2021-07-04 | Discharge: 2021-07-04 | Disposition: A | Discharge status: Home / Self Care | Payer: Worker's Compensation | Attending: Emergency Medicine | Admitting: Emergency Medicine

## 2021-07-04 DIAGNOSIS — I10 Essential (primary) hypertension: Secondary | ICD-10-CM | POA: Diagnosis not present

## 2021-07-04 DIAGNOSIS — R2240 Localized swelling, mass and lump, unspecified lower limb: Secondary | ICD-10-CM | POA: Diagnosis not present

## 2021-07-04 DIAGNOSIS — M7989 Other specified soft tissue disorders: Secondary | ICD-10-CM | POA: Insufficient documentation

## 2021-07-04 DIAGNOSIS — I824Y2 Acute embolism and thrombosis of unspecified deep veins of left proximal lower extremity: Secondary | ICD-10-CM | POA: Diagnosis not present

## 2021-07-04 DIAGNOSIS — Z794 Long term (current) use of insulin: Secondary | ICD-10-CM | POA: Insufficient documentation

## 2021-07-04 DIAGNOSIS — I82402 Acute embolism and thrombosis of unspecified deep veins of left lower extremity: Secondary | ICD-10-CM | POA: Diagnosis not present

## 2021-07-04 DIAGNOSIS — Z79899 Other long term (current) drug therapy: Secondary | ICD-10-CM | POA: Insufficient documentation

## 2021-07-04 MED ORDER — ENOXAPARIN SODIUM 150 MG/ML IJ SOSY
140.0000 mg | PREFILLED_SYRINGE | Freq: Once | INTRAMUSCULAR | Status: DC
  Filled 2021-07-04: qty 0.94

## 2021-07-04 MED ORDER — ENOXAPARIN SODIUM 100 MG/ML IJ SOSY
93.0000 mg | PREFILLED_SYRINGE | Freq: Once | INTRAMUSCULAR | Status: AC
  Administered 2021-07-04: 93 mg via SUBCUTANEOUS
  Filled 2021-07-04: qty 0.93

## 2021-07-04 NOTE — ED Triage Notes (Addendum)
Patient advised to go to ER by urgent care clinic for treatment due to right foot swelling/edema onset yesterday , she fractured her right knee last Wednesday . Hypertensive at triage .

## 2021-07-04 NOTE — Discharge Instructions (Addendum)
He was seen evaluated for right leg swelling.  As we discussed I think this is likely related to your recent injury.  I think that it should improve over time with ibuprofen, Tylenol, rest, ice, compression, elevation.  However as we discussed I cannot rule out a blood clot, and immobilization of the leg does put you at higher risk for blood clot.  We will give you a one-time dose of Lovenox in the emergency department today, please return the emergency department 11 AM tomorrow per your discharge instructions for ultrasound evaluation of the right lower extremity.  In the meantime please return immediately if you begin to have chest pain, shortness of breath.

## 2021-07-04 NOTE — ED Triage Notes (Signed)
Pt presents with swelling to right foot and has pain 3/10, pt states foot wasn't hurting initially and no imaging was done of foot , pain and swelling began after ed visit

## 2021-07-04 NOTE — ED Provider Notes (Signed)
[facility] EMERGENCY DEPARTMENT Provider Note   CSN: [id number] Arrival date & time: 07/04/21  2035     History  Chief Complaint  Patient presents with   Foot Swelling/Edema    Hypertensive    [name] is a 56 y.o. female with past medical history significant for recent evaluation for right tibial plateau fracture 2 days ago with current knee immobilization who presents with concern for right foot swelling that began today.  She was sent from urgent care for evaluation for possible blood clot.  Patient denies any chest pain, shortness of breath.  No previous history of blood clots.  She reports that she did ice, elevation of the affected extremity with no improvement of swelling.  She denies any numbness, tingling.  HPI     Home Medications Prior to Admission medications   Medication Sig Start Date End Date Taking? Authorizing Provider  acetaminophen (TYLENOL) 500 MG tablet OTC as directed.    [provider]  Adalimumab 40 MG/0.8ML PSKT Inject 40 mg into the skin once a week.     [provider]  ADVIL 200 MG tablet SMARTSIG:1 By Mouth 04/30/21   [provider]  albuterol (PROVENTIL) (2.5 MG/3ML) 0.083% nebulizer solution Take 3 mLs (2.5 mg total) by nebulization every 4 (four) hours as needed for wheezing or shortness of breath. 02/26/21   Valentina Shaggy, MD  albuterol (VENTOLIN HFA) 108 (90 Base) MCG/ACT inhaler Inhale 2 puffs into the lungs every 6 (six) hours as needed for wheezing or shortness of breath. 02/26/21   Valentina Shaggy, MD  BD VEO INSULIN SYRINGE U/F 31G X 15/64" 1 ML MISC  05/12/21   [provider]  Black Elderberry,Berry-Flower, 575 MG CAPS Take by mouth. 04/30/21   [provider]  buPROPion (WELLBUTRIN XL) 300 MG 24 hr tablet Take 1 tablet by mouth once daily 03/24/21   Tower, Wynelle Fanny, MD  carvedilol (COREG) 6.25 MG tablet Take 1 tablet (6.25 mg total) by mouth 2 (two) times daily  with a meal. 10/07/20   Tower, Wynelle Fanny, MD  cephALEXin (KEFLEX) 500 MG capsule Take 1 capsule (500 mg total) by mouth 3 (three) times daily. 05/22/21   Tower, Wynelle Fanny, MD  Cholecalciferol (VITAMIN D3 PO) Take 1 capsule by mouth daily.    [provider]  [employer]S TRIPLE MAGNESIUM COMPLEX PO Take 1 capsule by mouth daily.    [provider]  cyclobenzaprine (FLEXERIL) 10 MG tablet Take 1 tablet (10 mg total) by mouth 3 (three) times daily as needed for muscle spasms. Caution of sedation 11/16/18   Tower, Wynelle Fanny, MD  D-5000 125 MCG (5000 UT) TABS SMARTSIG:1 By Mouth 04/30/21   [provider]  diphenhydrAMINE (BENADRYL) 25 mg capsule Take 25 mg by mouth as needed. OTC as directed.    [provider]  esomeprazole (NEXIUM) 20 MG capsule Take 1 capsule (20 mg total) by mouth daily at 12 noon. 04/22/16   Tower, Wynelle Fanny, MD  fluticasone (FLONASE) 50 MCG/ACT nasal spray Place 2 sprays into both nostrils daily.    [provider]  folic acid (FOLVITE) 1 MG tablet Take 3 mg by mouth 4 (four) times a week.     [provider]  glucose blood (ONETOUCH VERIO) test strip To check glucose daily and prn for diabetes type 2 05/22/21   Tower, Wynelle Fanny, MD  HUMIRA PEN 40 MG/0.8ML PNKT SMARTSIG:1 SUB-Q 04/30/21   [provider]  hydrochlorothiazide (MICROZIDE) 12.5 MG capsule Take 1 capsule by mouth once daily 01/13/21   Tower, Wynelle Fanny, MD  Insulin Syringe-Needle U-100 (INSULIN SYRINGE 1CC/31GX5/16") 31G X 5/16" 1 ML MISC See admin instructions. 05/09/21 05/09/22  [provider]  ketorolac (ACULAR) 0.5 % ophthalmic solution 1 drop 4 (four) times daily. 11/24/18   [provider]  Lancets (ONETOUCH DELICA PLUS [id number]) [place] SMARTSIG:1 Lancet(s) Topical Twice Daily PRN 08/22/19   [provider]  leucovorin (WELLCOVORIN) 5 MG tablet Take 5 mg by mouth 2 (two) times a week.    [provider]  levothyroxine (SYNTHROID) 25 MCG tablet Take  1 tablet (25 mcg total) by mouth daily. 07/10/20   Tower, Wynelle Fanny, MD  magnesium oxide (MAG-OX) 400 MG tablet Take 400 mg by mouth daily.    [provider]  metFORMIN (GLUCOPHAGE) 1000 MG tablet TAKE 1 TABLET BY MOUTH TWICE DAILY WITH A MEAL 10/03/20   Tower, Wynelle Fanny, MD  methocarbamol (ROBAXIN) 500 MG tablet Take 1 tablet (500 mg total) by mouth every 8 (eight) hours as needed for muscle spasms. Caution of sedation 10/07/20   Tower, Wynelle Fanny, MD  Methotrexate 25 MG/ML SOSY Inject 25 mg into the skin once a week.    [provider]  methotrexate 250 MG/10ML injection Inject into the skin. 04/30/21   [provider]  mometasone (ELOCON) 0.1 % cream Apply topically. 04/30/21   [provider]  Multiple Vitamins-Minerals (EMERGEN-C IMMUNE PLUS PO) Take 50 mg by mouth 2 times daily at 12 noon and 4 pm.    [provider]  naproxen (NAPROSYN) 375 MG tablet Take 1 tablet (375 mg total) by mouth 2 (two) times daily as needed. With a meal 10/07/20   Tower, Wynelle Fanny, MD  ondansetron (ZOFRAN-ODT) 4 MG disintegrating tablet Take 1 tablet (4 mg total) by mouth every 8 (eight) hours as needed for nausea or vomiting. 03/26/21   Davonna Belling, MD  oxyCODONE-acetaminophen (PERCOCET/ROXICET) 5-325 MG tablet Take 1 tablet by mouth every 4 (four) hours as needed. 07/02/21   Triplett, Tammy, PA-C  prednisoLONE acetate (PRED FORTE) 1 % ophthalmic suspension Place 4 drops into both eyes daily.    [provider]  rosuvastatin (CRESTOR) 5 MG tablet Take 1 tablet (5 mg total) by mouth every other day. 07/10/20   Tower, Wynelle Fanny, MD  SINGULAIR 10 MG tablet  04/30/21   [provider]  SYMBICORT 160-4.5 MCG/ACT inhaler Inhale 2 puffs into the lungs 2 (two) times daily. 02/26/21   Valentina Shaggy, MD  SYMBICORT 80-4.5 MCG/ACT inhaler SMARTSIG:1 Via Inhaler Daily 04/30/21   [provider]  TYLENOL 325 MG tablet SMARTSIG:1 By Mouth 04/30/21   [provider]      Allergies    Amlodipine, Cetirizine hcl, Erythromycin, Escitalopram oxalate, Lisinopril, Loratadine, Nifedipine, Sertraline hcl, and Vyvanse [lisdexamfetamine dimesylate]    Review of Systems   Review of Systems  Musculoskeletal:  Positive for joint swelling.  All other systems reviewed and are negative.   Physical Exam Updated Vital Signs BP (!) 194/109   Pulse (!) 103   Temp 98.8 F (37.1 C) (Oral)   Resp 18   LMP 08/05/2006   SpO2 98%  Physical Exam Vitals and nursing note reviewed.  Constitutional:      General: She is not in acute distress.    Appearance: Normal appearance.  HENT:     Head: Normocephalic and atraumatic.  Eyes:     General:  Right eye: No discharge.        Left eye: No discharge.  Cardiovascular:     Rate and Rhythm: Normal rate and regular rhythm.     Pulses: Normal pulses.  Pulmonary:     Effort: Pulmonary effort is normal. No respiratory distress.  Musculoskeletal:        General: No deformity.     Comments: Patient with 1-2+ swelling of right lower extremity, worse at dorsum of the foot.  No significant tenderness palpation of the ankle, calf.  She is in a knee immobilizer secondary to right tibial plateau fracture.  Skin:    General: Skin is warm and dry.     Capillary Refill: Capillary refill takes less than 2 seconds.  Neurological:     Mental Status: She is alert and oriented to person, place, and time.  Psychiatric:        Mood and Affect: Mood normal.        Behavior: Behavior normal.     ED Results / Procedures / Treatments   Labs (all labs ordered are listed, but only abnormal results are displayed) Labs Reviewed - No data to display  EKG None  Radiology No results found.  Procedures Procedures    Medications Ordered in ED Medications - No data to display  ED Course/ Medical Decision Making/ A&P                           Medical Decision Making  This an overall well-appearing 56 year old female  who presents with concern for right lower extremity swelling, in context of recent right tibial plateau fracture.  She denies any chest pain, shortness of breath, previous history of blood clots.  She was sent from urgent care for evaluation of possible DVT.  On arrival she is very minimally tachycardic, as well as with some hypertension, systolic 725/366.  She denies any chest pain, shortness of breath, difficulty breathing, hemoptysis, vision changes, or headache.  Discussed with patient that I think the swelling is most likely related to her recent tibial plateau fracture and the associated inflammation, but it is impossible to rule out blood clot without any imaging.  As it is past 7 PM today we cannot perform vascular imaging of the right lower extremity to rule out DVT.  Discussed with my attending physician Dr. Pearline Cables, and we agreed to prophylactically treat with Lovenox, and have patient return in the morning for DVT study.  In the meantime encouraged rest, ice, compression, elevation of the affected extremity.  Patient discharged in stable condition at this time, return precautions given. Final Clinical Impression(s) / ED Diagnoses Final diagnoses:  Right leg swelling    Rx / DC Orders ED Discharge Orders          Ordered    [facility]ENOUS       Comments: DVT rule out, fx 6/28 with feet swelling   07/04/21 2104              Anselmo Pickler, PA-C 07/04/21 2114    Wynona Dove A, DO 07/05/21 0017

## 2021-07-05 ENCOUNTER — Ambulatory Visit (HOSPITAL_BASED_OUTPATIENT_CLINIC_OR_DEPARTMENT_OTHER)
Admission: RE | Admit: 2021-07-05 | Discharge: 2021-07-05 | Disposition: A | Discharge status: Home / Self Care | Payer: Worker's Compensation | Source: Ambulatory Visit | Attending: Emergency Medicine | Admitting: Emergency Medicine

## 2021-07-05 ENCOUNTER — Emergency Department (HOSPITAL_COMMUNITY)
Admission: EM | Admit: 2021-07-05 | Discharge: 2021-07-05 | Disposition: A | Discharge status: Home / Self Care | Payer: Worker's Compensation | Attending: Emergency Medicine | Admitting: Emergency Medicine

## 2021-07-05 ENCOUNTER — Other Ambulatory Visit: Payer: Self-pay

## 2021-07-05 ENCOUNTER — Encounter (HOSPITAL_COMMUNITY): Payer: Self-pay

## 2021-07-05 DIAGNOSIS — R52 Pain, unspecified: Secondary | ICD-10-CM | POA: Diagnosis not present

## 2021-07-05 DIAGNOSIS — M7989 Other specified soft tissue disorders: Secondary | ICD-10-CM | POA: Diagnosis not present

## 2021-07-05 DIAGNOSIS — R600 Localized edema: Secondary | ICD-10-CM | POA: Insufficient documentation

## 2021-07-05 DIAGNOSIS — I82401 Acute embolism and thrombosis of unspecified deep veins of right lower extremity: Secondary | ICD-10-CM | POA: Insufficient documentation

## 2021-07-05 DIAGNOSIS — R936 Abnormal findings on diagnostic imaging of limbs: Secondary | ICD-10-CM | POA: Insufficient documentation

## 2021-07-05 DIAGNOSIS — I824Y2 Acute embolism and thrombosis of unspecified deep veins of left proximal lower extremity: Secondary | ICD-10-CM | POA: Diagnosis not present

## 2021-07-05 DIAGNOSIS — I82402 Acute embolism and thrombosis of unspecified deep veins of left lower extremity: Secondary | ICD-10-CM | POA: Diagnosis not present

## 2021-07-05 DIAGNOSIS — Z794 Long term (current) use of insulin: Secondary | ICD-10-CM | POA: Diagnosis not present

## 2021-07-05 DIAGNOSIS — M79604 Pain in right leg: Secondary | ICD-10-CM | POA: Insufficient documentation

## 2021-07-05 DIAGNOSIS — I824Y1 Acute embolism and thrombosis of unspecified deep veins of right proximal lower extremity: Secondary | ICD-10-CM

## 2021-07-05 LAB — CBC
HCT: 39.2 % (ref 36.0–46.0)
Hemoglobin: 13.1 g/dL (ref 12.0–15.0)
MCH: 30.3 pg (ref 26.0–34.0)
MCHC: 33.4 g/dL (ref 30.0–36.0)
MCV: 90.7 fL (ref 80.0–100.0)
Platelets: 252 10*3/uL (ref 150–400)
RBC: 4.32 MIL/uL (ref 3.87–5.11)
RDW: 14.4 % (ref 11.5–15.5)
WBC: 11.7 10*3/uL — ABNORMAL HIGH (ref 4.0–10.5)
nRBC: 0 % (ref 0.0–0.2)

## 2021-07-05 LAB — COMPREHENSIVE METABOLIC PANEL
ALT: 17 U/L (ref 0–44)
AST: 30 U/L (ref 15–41)
Albumin: 3.6 g/dL (ref 3.5–5.0)
Alkaline Phosphatase: 79 U/L (ref 38–126)
Anion gap: 10 (ref 5–15)
BUN: 10 mg/dL (ref 6–20)
CO2: 24 mmol/L (ref 22–32)
Calcium: 9.1 mg/dL (ref 8.9–10.3)
Chloride: 105 mmol/L (ref 98–111)
Creatinine, Ser: 0.86 mg/dL (ref 0.44–1.00)
GFR, Estimated: >60 mL/min (ref >60)
Glucose, Bld: 130 mg/dL — ABNORMAL HIGH (ref 70–99)
Potassium: 4.2 mmol/L (ref 3.5–5.1)
Sodium: 139 mmol/L (ref 135–145)
Total Bilirubin: 0.8 mg/dL (ref 0.3–1.2)
Total Protein: 7.1 g/dL (ref 6.5–8.1)

## 2021-07-05 MED ORDER — RIVAROXABAN (XARELTO) VTE STARTER PACK (15 & 20 MG): ORAL_TABLET | ORAL | 0 refills | Status: DC

## 2021-07-05 MED ORDER — RIVAROXABAN 15 MG PO TABS
15.0000 mg | ORAL_TABLET | Freq: Two times a day (BID) | ORAL | Status: DC
  Administered 2021-07-05: 15 mg via ORAL
  Filled 2021-07-05: qty 1

## 2021-07-05 MED ORDER — RIVAROXABAN 15 MG PO TABS
15.0000 mg | ORAL_TABLET | Freq: Every day | ORAL | Status: DC
  Filled 2021-07-05: qty 1

## 2021-07-05 MED ORDER — RIVAROXABAN (XARELTO) EDUCATION KIT FOR DVT/PE PATIENTS
PACK | Freq: Once | Status: AC
  Filled 2021-07-05: qty 1

## 2021-07-05 MED ORDER — RIVAROXABAN 10 MG PO TABS: 20.0000 mg | ORAL_TABLET | Freq: Every day | ORAL | Status: DC

## 2021-07-05 NOTE — ED Triage Notes (Signed)
Patient sent from vascular after it was determined patient has significant DVT in right leg. Patient had fall this Wednesday and has fracture to right knee. Alert and oriented, no SOB, no distress

## 2021-07-05 NOTE — ED Notes (Signed)
Pt taken to lobby.  Verbalized understanding of discharge instructions and follow-up.

## 2021-07-05 NOTE — ED Provider Notes (Addendum)
[facility] EMERGENCY DEPARTMENT Provider Note   CSN: [id number] Arrival date & time: 07/05/21  1239     History  Chief Complaint  Patient presents with   DVT    [name] is a 56 y.o. female.  Patient presents with recent diagnosis of DVT in the right lower extremity and sent to the ER.  She had a fall 2 days ago sustained a right tibial plateau fracture is scheduled to see orthopedic surgeon on outpatient basis.  She had increased welling to the leg had ultrasound done today consistent with DVT and brought to the ER.  Otherwise denies headache denies chest pain denies shortness of breath denies fevers cough vomiting or diarrhea.       Home Medications Prior to Admission medications   Medication Sig Start Date End Date Taking? Authorizing Provider  acetaminophen (TYLENOL) 500 MG tablet Take 1 tablet (500 mg total) by mouth every 12 (twelve) hours. 07/11/21   [name] Spinner, PA-C  ADVIL 200 MG CAPS Take 400 mg by mouth every 6 (six) hours as needed (for mild pain).    Provider, Historical, [name]  albuterol (PROVENTIL) (2.5 MG/3ML) 0.083% nebulizer solution Take 3 mLs (2.5 mg total) by nebulization every 4 (four) hours as needed for wheezing or shortness of breath. 02/26/21   [name] Shaggy, [name]  albuterol (VENTOLIN HFA) 108 (90 Base) MCG/ACT inhaler Inhale 2 puffs into the lungs every 6 (six) hours as needed for wheezing or shortness of breath. 02/26/21   [name] Shaggy, [name]  BD VEO INSULIN SYRINGE U/F 31G X 15/64" 1 ML MISC  05/12/21   Provider, Historical, [name]  buPROPion (WELLBUTRIN XL) 300 MG 24 hr tablet Take 1 tablet by mouth once daily Patient taking differently: Take 300 mg by mouth daily. 03/24/21   [name]  carvedilol (COREG) 6.25 MG tablet Take 1 tablet (6.25 mg total) by mouth 2 (two) times daily with a meal. 10/07/20   [name]  cetirizine (ZYRTEC) 10 MG tablet Take 10 mg by mouth daily as needed for allergies or rhinitis.     Provider, Historical, [name]  Cholecalciferol (VITAMIN D3 PO) Take 1 capsule by mouth daily.    Provider, Historical, [name]  cyclobenzaprine (FLEXERIL) 10 MG tablet Take 1 tablet (10 mg total) by mouth 3 (three) times daily as needed for muscle spasms. Caution of sedation Patient not taking: Reported on 07/10/2021 11/16/18   [name]  diphenhydrAMINE (BENADRYL) 25 mg capsule Take 25-50 mg by mouth every 6 (six) hours as needed for allergies.    Provider, Historical, [name]  docusate sodium (COLACE) 100 MG capsule Take 1 capsule (100 mg total) by mouth 2 (two) times daily. 07/11/21   [name] Spinner, PA-C  esomeprazole (NEXIUM) 20 MG capsule Take 1 capsule (20 mg total) by mouth daily at 12 noon. Patient taking differently: Take 20 mg by mouth at bedtime. 04/22/16   [name]  folic acid (FOLVITE) 1 MG tablet Take 3 mg by mouth See admin instructions. Take 3 mg by mouth once a day on Wed/Thurs/Fri/Sat    Provider, Historical, [name]  glucose blood (ONETOUCH VERIO) test strip To check glucose daily and prn for diabetes type 2 05/22/21   [name]  HUMIRA PEN 40 MG/0.8ML PNKT Inject 40 mg into the skin every Monday. 04/30/21   Provider, Historical, [name]  hydrochlorothiazide (HYDRODIURIL) 12.5 MG tablet Take 12.5 mg by mouth daily.    Provider,  Historical, [name]  hydrochlorothiazide (MICROZIDE) 12.5 MG capsule Take 1 capsule by mouth once daily Patient not taking: Reported on 07/10/2021 01/13/21   [name]  Insulin Syringe-Needle U-100 (INSULIN SYRINGE 1CC/31GX5/16") 31G X 5/16" 1 ML MISC See admin instructions. 05/09/21 05/09/22  Provider, Historical, [name]  ketorolac (ACULAR) 0.5 % ophthalmic solution Place 1 drop into the left eye 2 (two) times daily. 11/24/18   Provider, Historical, [name]  Lancets (ONETOUCH DELICA PLUS [id number]) [place] SMARTSIG:1 Lancet(s) Topical Twice Daily PRN 08/22/19   Provider, Historical, [name]  leucovorin (WELLCOVORIN) 5 MG tablet Take 5 mg by mouth See admin instructions. Take 5 mg by  mouth once a day on only Sundays and Tuesdays    Provider, Historical, [name]  levothyroxine (SYNTHROID) 25 MCG tablet Take 1 tablet (25 mcg total) by mouth daily. Patient taking differently: Take 25 mcg by mouth daily before breakfast. 07/10/20   [name]  magnesium oxide (MAG-OX) 400 MG tablet Take 400 mg by mouth daily.    Provider, Historical, [name]  metFORMIN (GLUCOPHAGE) 1000 MG tablet TAKE 1 TABLET BY MOUTH TWICE DAILY WITH A MEAL Patient taking differently: Take 1,000 mg by mouth in the morning and at bedtime. 10/03/20   [name]  methocarbamol (ROBAXIN) 500 MG tablet Take 1-2 tablets (500-1,000 mg total) by mouth every 8 (eight) hours as needed for muscle spasms. Caution of sedation 07/11/21   [name] Spinner, PA-C  Methotrexate 25 MG/ML SOSY Inject 17.5 mg into the skin every Monday.    Provider, Historical, [name]  ondansetron (ZOFRAN-ODT) 4 MG disintegrating tablet Take 1 tablet (4 mg total) by mouth every 8 (eight) hours as needed for nausea or vomiting. 03/26/21   [name] Belling, [name]  oxyCODONE-acetaminophen (PERCOCET/ROXICET) 5-325 MG tablet Take 1 tablet by mouth every 4 (four) hours as needed. Patient taking differently: Take 1 tablet by mouth every 4 (four) hours as needed (for pain). 07/02/21   Triplett, Tammy, PA-C  prednisoLONE acetate (PRED FORTE) 1 % ophthalmic suspension Place 4 drops into the left eye 2 (two) times daily.    Provider, Historical, [name]  RIVAROXABAN Alveda Reasons) VTE STARTER PACK (15 & 20 MG) Follow package directions: Take one 67m tablet by mouth twice a day. On day 22, switch to one 260mtablet once a day. Take with food. Patient taking differently: Take 15-20 mg by mouth See admin instructions. Follow package directions: Take one 15 mg tablet by mouth twice a day. On day 22, switch to one 2035mablet once a day. Take with food. 07/05/21   [name]  rosuvastatin (CRESTOR) 5 MG tablet Take 1 tablet (5 mg total) by mouth every other day. Patient taking  differently: Take 5 mg by mouth See admin instructions. Take 5 mg by mouth once a day on Sun/Wed/Fri 07/10/20   [name]  SYMBICORT 160-4.5 MCG/ACT inhaler Inhale 2 puffs into the lungs 2 (two) times daily. Patient taking differently: Inhale 2 puffs into the lungs 2 (two) times daily as needed (for respiratory flares). 02/26/21   GalValentina ShaggyD  traMADol (ULTRAM) 50 MG tablet Take 50 mg by mouth every 6 (six) hours as needed (for fibromyalgia flares).    Provider, Historical, [name]      Allergies    Amlodipine, Cetirizine hcl, Erythromycin, Escitalopram oxalate, Lisinopril, Loratadine, Nifedipine, Sertraline hcl, and Vyvanse [lisdexamfetamine dimesylate]    Review of Systems   Review of Systems  Constitutional:  Negative for fever.  HENT:  Negative for ear pain.   Eyes:  Negative for pain.  Respiratory:  Negative for cough.   Cardiovascular:  Negative for chest pain.  Gastrointestinal:  Negative for abdominal pain.  Genitourinary:  Negative for flank pain.  Musculoskeletal:  Negative for back pain.  Skin:  Negative for rash.  Neurological:  Negative for headaches.    Physical Exam Updated Vital Signs BP (!) 160/97   Pulse 99   Temp 99 F (37.2 C) (Oral)   Resp 18   LMP 08/05/2006   SpO2 97%  Physical Exam Constitutional:      General: She is not in acute distress.    Appearance: Normal appearance.  HENT:     Head: Normocephalic.     Nose: Nose normal.  Eyes:     Extraocular Movements: Extraocular movements intact.  Cardiovascular:     Rate and Rhythm: Normal rate.  Pulmonary:     Effort: Pulmonary effort is normal.  Musculoskeletal:     Cervical back: Normal range of motion.     Comments: Right lower extremity in splint.  Neurovascular intact distally.  Compartments are soft although swollen.  Intact dorsalis pedis pulses.  Wiggles toes without pain.  Neurological:     General: No focal deficit present.     Mental Status: She is alert. Mental status is  at baseline.     ED Results / Procedures / Treatments   Labs (all labs ordered are listed, but only abnormal results are displayed) Labs Reviewed  COMPREHENSIVE METABOLIC PANEL - Abnormal; Notable for the following components:      Result Value   Glucose, Bld 130 (*)    All other components within normal limits  CBC - Abnormal; Notable for the following components:   WBC 11.7 (*)    All other components within normal limits    EKG None  Radiology No results found.  Procedures Procedures    Medications Ordered in ED Medications  rivaroxaban Alveda Reasons) Education Kit for DVT/PE patients ( Does not apply Given by Other 07/05/21 1442)    ED Course/ Medical Decision Making/ A&P                           Medical Decision Making Amount and/or Complexity of Data Reviewed Labs: ordered.  Risk Prescription drug management.   Review of records shows visit yesterday for right lower extremity swelling.  Review of outpatient work-up shows DVT in the right calf.  Patient given Xarelto 15 mg here.  Prescription for additional Xarelto given.  Advised follow-up again with the orthopedist in 3 days.  Advised immediate return for worsening symptoms or any additional concerns.  Patient advised to stop any aspirin or Motrin or Advil or NSAID.  Tylenol would be safe to take with Xarelto.  Advised to return back to the ER immediately if she has a fall or injury especially head injury or any other injury that may lead to increased bleeding propensity.        Final Clinical Impression(s) / ED Diagnoses Final diagnoses:  Acute deep vein thrombosis (DVT) of proximal vein of right lower extremity ([place])    Rx / DC Orders ED Discharge Orders          Ordered    RIVAROXABAN (XARELTO) VTE STARTER PACK (15 & 20 MG)  Status:  Discontinued        07/05/21 1426    RIVAROXABAN (XARELTO) VTE STARTER PACK (15 & 20 MG)  07/05/21 1445              [name] Fuse, [name] 07/05/21  1427    [name] Fuse, [name] 07/17/21 0005

## 2021-07-05 NOTE — Progress Notes (Signed)
VASCULAR LAB    Right lower extremity venous duplex has been performed.  See CV proc for preliminary results.   Kourtlynn Trevor, RVT 07/05/2021, 12:29 PM

## 2021-07-05 NOTE — Discharge Instructions (Addendum)
Take Xarelto as a blood thinning medicine for your clot in your right lower extremity.  This medicine can cause increased bleeding risk.  So if you fall or have a motor vehicle accident or other injury that has high risk for bleeding you need to return immediately to the ER for evaluation.  Follow-up with your orthopedic specialist on Tuesday as scheduled.  Return to the ER if you have fevers worsening symptoms or any additional concerns.  Information on my medicine - XARELTO (rivaroxaban)  This medication education was reviewed with me or my healthcare representative as part of my discharge preparation.  WHY WAS XARELTO PRESCRIBED FOR YOU? Xarelto was prescribed to treat blood clots that may have been found in the veins of your legs (deep vein thrombosis) or in your lungs (pulmonary embolism) and to reduce the risk of them occurring again.  What do you need to know about Xarelto? The starting dose is one 15 mg tablet taken TWICE daily with food for the FIRST 21 DAYS then on the 22nd day the dose is changed to one 20 mg tablet taken ONCE A DAY with your evening meal.  DO NOT stop taking Xarelto without talking to the health care provider who prescribed the medication.  Refill your prescription for 20 mg tablets before you run out.  After discharge, you should have regular check-up appointments with your healthcare provider that is prescribing your Xarelto.  In the future your dose may need to be changed if your kidney function changes by a significant amount.  What do you do if you miss a dose? If you are taking Xarelto TWICE DAILY and you miss a dose, take it as soon as you remember. You may take two 15 mg tablets (total 30 mg) at the same time then resume your regularly scheduled 15 mg twice daily the next day.  If you are taking Xarelto ONCE DAILY and you miss a dose, take it as soon as you remember on the same day then continue your regularly scheduled once daily regimen the next day.  Do not take two doses of Xarelto at the same time.   Important Safety Information Xarelto is a blood thinner medicine that can cause bleeding. You should call your healthcare provider right away if you experience any of the following: Bleeding from an injury or your nose that does not stop. Unusual colored urine (red or dark brown) or unusual colored stools (red or black). Unusual bruising for unknown reasons. A serious fall or if you hit your head (even if there is no bleeding).  Some medicines may interact with Xarelto and might increase your risk of bleeding while on Xarelto. To help avoid this, consult your healthcare provider or pharmacist prior to using any new prescription or non-prescription medications, including herbals, vitamins, non-steroidal anti-inflammatory drugs (NSAIDs) and supplements.  This website has more information on Xarelto: [email or web address]

## 2021-07-09 ENCOUNTER — Encounter (HOSPITAL_COMMUNITY): Payer: Self-pay | Admitting: Orthopedic Surgery

## 2021-07-09 ENCOUNTER — Other Ambulatory Visit: Payer: Self-pay

## 2021-07-09 NOTE — Progress Notes (Signed)
PCP - Dr Loura Pardon Cardiologist - n/a Allergy & Asthma - Dr Salvatore Marvel  Chest x-ray - n/a EKG - 07/02/21 Stress Test - n/a ECHO - n/a Cardiac Cath - n/a  ICD Pacemaker/Loop - n/a  Sleep Study -  Yes CPAP - uses cpap nightly  Do not take Metformin on the morning of surgery.  Patient does not check her blood sugars.     Last dose of Xarelto was on 07/09/21.    STOP now taking any Aspirin (unless otherwise instructed by your surgeon), Aleve, Naproxen, Ibuprofen, Motrin, Advil, Goody's, BC's, all herbal medications, fish oil, and all vitamins.   Coronavirus Screening Do you have any of the following symptoms:  Cough yes/no: No Fever (>100.103F)  yes/no: No Runny nose yes/no: No Sore throat yes/no: No Difficulty breathing/shortness of breath  yes/no: No  Have you traveled in the last 14 days and where? yes/no: No  Patient verbalized understanding of instructions that were given via phone.

## 2021-07-09 NOTE — Anesthesia Preprocedure Evaluation (Signed)
Anesthesia Evaluation  Patient identified by MRN, date of birth, ID band Patient awake    Reviewed: Allergy & Precautions, NPO status , Patient's Chart, lab work & pertinent test results, reviewed documented beta blocker date and time   History of Anesthesia Complications (+) PONV and history of anesthetic complications  Airway Mallampati: III  TM Distance: >3 FB Neck ROM: Full    Dental no notable dental hx. (+) Teeth Intact, Dental Advisory Given   Pulmonary asthma , sleep apnea ,    Pulmonary exam normal breath sounds clear to auscultation       Cardiovascular hypertension, Pt. on medications and Pt. on home beta blockers + DVT  Normal cardiovascular exam Rhythm:Regular Rate:Normal  Doppler 07/07/21 RIGHT:  - Findings consistent with acute deep vein thrombosis involving the right  popliteal vein, right posterior tibial veins, right peroneal veins, and  right gastrocnemius veins.    Neuro/Psych  Headaches, PSYCHIATRIC DISORDERS Anxiety Depression    GI/Hepatic Neg liver ROS, GERD  ,  Endo/Other  diabetes, Type 2, Oral Hypoglycemic AgentsHypothyroidism   Renal/GU negative Renal ROS  negative genitourinary   Musculoskeletal  (+) Arthritis , Fibromyalgia -  Abdominal   Peds  (+) ADHD Hematology  (+) Blood dyscrasia (on xarelto, last dose yesterday PM), ,   Anesthesia Other Findings   Reproductive/Obstetrics                            Anesthesia Physical Anesthesia Plan  ASA: 3  Anesthesia Plan: General   Post-op Pain Management: Tylenol PO (pre-op)* and Ketamine IV*   Induction: Intravenous  PONV Risk Score and Plan: 4 or greater and Midazolam, Dexamethasone, Ondansetron and Scopolamine patch - Pre-op  Airway Management Planned: Oral ETT  Additional Equipment:   Intra-op Plan:   Post-operative Plan: Extubation in OR  Informed Consent: I have reviewed the patients History and  Physical, chart, labs and discussed the procedure including the risks, benefits and alternatives for the proposed anesthesia with the patient or authorized representative who has indicated his/her understanding and acceptance.     Dental advisory given  Plan Discussed with: CRNA  Anesthesia Plan Comments:         Anesthesia Quick Evaluation

## 2021-07-10 ENCOUNTER — Ambulatory Visit (HOSPITAL_COMMUNITY): Payer: Worker's Compensation

## 2021-07-10 ENCOUNTER — Encounter (HOSPITAL_COMMUNITY): Payer: Self-pay | Admitting: Orthopedic Surgery

## 2021-07-10 ENCOUNTER — Ambulatory Visit (HOSPITAL_BASED_OUTPATIENT_CLINIC_OR_DEPARTMENT_OTHER): Payer: Worker's Compensation | Admitting: Anesthesiology

## 2021-07-10 ENCOUNTER — Encounter (HOSPITAL_COMMUNITY)
Admission: RE | Disposition: A | Discharge status: Home / Self Care | Payer: Self-pay | Source: Home / Self Care | Attending: Orthopedic Surgery

## 2021-07-10 ENCOUNTER — Other Ambulatory Visit: Payer: Self-pay

## 2021-07-10 ENCOUNTER — Ambulatory Visit (HOSPITAL_COMMUNITY)
Admission: RE | Admit: 2021-07-10 | Discharge: 2021-07-12 | Disposition: A | Discharge status: Home / Self Care | Payer: Worker's Compensation | Attending: Orthopedic Surgery | Admitting: Orthopedic Surgery

## 2021-07-10 ENCOUNTER — Ambulatory Visit (HOSPITAL_COMMUNITY): Payer: Worker's Compensation | Admitting: Anesthesiology

## 2021-07-10 DIAGNOSIS — F909 Attention-deficit hyperactivity disorder, unspecified type: Secondary | ICD-10-CM | POA: Insufficient documentation

## 2021-07-10 DIAGNOSIS — I10 Essential (primary) hypertension: Secondary | ICD-10-CM | POA: Insufficient documentation

## 2021-07-10 DIAGNOSIS — Z86718 Personal history of other venous thrombosis and embolism: Secondary | ICD-10-CM | POA: Diagnosis present

## 2021-07-10 DIAGNOSIS — F419 Anxiety disorder, unspecified: Secondary | ICD-10-CM | POA: Diagnosis not present

## 2021-07-10 DIAGNOSIS — I82431 Acute embolism and thrombosis of right popliteal vein: Secondary | ICD-10-CM | POA: Insufficient documentation

## 2021-07-10 DIAGNOSIS — F418 Other specified anxiety disorders: Secondary | ICD-10-CM | POA: Diagnosis present

## 2021-07-10 DIAGNOSIS — Z7951 Long term (current) use of inhaled steroids: Secondary | ICD-10-CM | POA: Insufficient documentation

## 2021-07-10 DIAGNOSIS — M797 Fibromyalgia: Secondary | ICD-10-CM | POA: Diagnosis not present

## 2021-07-10 DIAGNOSIS — I82401 Acute embolism and thrombosis of unspecified deep veins of right lower extremity: Secondary | ICD-10-CM | POA: Diagnosis present

## 2021-07-10 DIAGNOSIS — G4730 Sleep apnea, unspecified: Secondary | ICD-10-CM | POA: Insufficient documentation

## 2021-07-10 DIAGNOSIS — S82121A Displaced fracture of lateral condyle of right tibia, initial encounter for closed fracture: Secondary | ICD-10-CM

## 2021-07-10 DIAGNOSIS — S83241A Other tear of medial meniscus, current injury, right knee, initial encounter: Secondary | ICD-10-CM

## 2021-07-10 DIAGNOSIS — E039 Hypothyroidism, unspecified: Secondary | ICD-10-CM | POA: Diagnosis not present

## 2021-07-10 DIAGNOSIS — S83281A Other tear of lateral meniscus, current injury, right knee, initial encounter: Secondary | ICD-10-CM | POA: Insufficient documentation

## 2021-07-10 DIAGNOSIS — S82141A Displaced bicondylar fracture of right tibia, initial encounter for closed fracture: Secondary | ICD-10-CM | POA: Insufficient documentation

## 2021-07-10 DIAGNOSIS — M79621 Pain in right upper arm: Secondary | ICD-10-CM | POA: Diagnosis not present

## 2021-07-10 DIAGNOSIS — F32A Depression, unspecified: Secondary | ICD-10-CM | POA: Diagnosis not present

## 2021-07-10 DIAGNOSIS — Z79899 Other long term (current) drug therapy: Secondary | ICD-10-CM | POA: Insufficient documentation

## 2021-07-10 DIAGNOSIS — F988 Other specified behavioral and emotional disorders with onset usually occurring in childhood and adolescence: Secondary | ICD-10-CM | POA: Diagnosis present

## 2021-07-10 DIAGNOSIS — K219 Gastro-esophageal reflux disease without esophagitis: Secondary | ICD-10-CM | POA: Diagnosis not present

## 2021-07-10 DIAGNOSIS — J4520 Mild intermittent asthma, uncomplicated: Secondary | ICD-10-CM | POA: Diagnosis not present

## 2021-07-10 DIAGNOSIS — E1165 Type 2 diabetes mellitus with hyperglycemia: Secondary | ICD-10-CM | POA: Diagnosis not present

## 2021-07-10 DIAGNOSIS — E119 Type 2 diabetes mellitus without complications: Secondary | ICD-10-CM | POA: Diagnosis present

## 2021-07-10 DIAGNOSIS — Z79890 Hormone replacement therapy (postmenopausal): Secondary | ICD-10-CM | POA: Insufficient documentation

## 2021-07-10 DIAGNOSIS — W1830XA Fall on same level, unspecified, initial encounter: Secondary | ICD-10-CM | POA: Insufficient documentation

## 2021-07-10 DIAGNOSIS — Z4789 Encounter for other orthopedic aftercare: Secondary | ICD-10-CM | POA: Diagnosis not present

## 2021-07-10 DIAGNOSIS — Z7984 Long term (current) use of oral hypoglycemic drugs: Secondary | ICD-10-CM | POA: Diagnosis not present

## 2021-07-10 HISTORY — DX: Fatty (change of) liver, not elsewhere classified: K76.0

## 2021-07-10 HISTORY — DX: Thyrotoxicosis, unspecified without thyrotoxic crisis or storm: E05.90

## 2021-07-10 HISTORY — DX: Anxiety disorder, unspecified: F41.9

## 2021-07-10 HISTORY — DX: Acute embolism and thrombosis of unspecified deep veins of right lower extremity: I82.401

## 2021-07-10 HISTORY — DX: Hypothyroidism, unspecified: E03.9

## 2021-07-10 HISTORY — DX: Other specified postprocedural states: Z98.890

## 2021-07-10 HISTORY — DX: Attention-deficit hyperactivity disorder, unspecified type: F90.9

## 2021-07-10 HISTORY — DX: Nausea with vomiting, unspecified: R11.2

## 2021-07-10 HISTORY — PX: ORIF TIBIA FRACTURE: SHX5416

## 2021-07-10 LAB — VITAMIN D 25 HYDROXY (VIT D DEFICIENCY, FRACTURES): Vit D, 25-Hydroxy: 27.2 ng/mL — ABNORMAL LOW (ref 30–100)

## 2021-07-10 LAB — COMPREHENSIVE METABOLIC PANEL
ALT: 12 U/L (ref 0–44)
AST: 26 U/L (ref 15–41)
Albumin: 3.3 g/dL — ABNORMAL LOW (ref 3.5–5.0)
Alkaline Phosphatase: 77 U/L (ref 38–126)
Anion gap: 12 (ref 5–15)
BUN: 12 mg/dL (ref 6–20)
CO2: 25 mmol/L (ref 22–32)
Calcium: 9.5 mg/dL (ref 8.9–10.3)
Chloride: 102 mmol/L (ref 98–111)
Creatinine, Ser: 0.84 mg/dL (ref 0.44–1.00)
GFR, Estimated: >60 mL/min (ref >60)
Glucose, Bld: 155 mg/dL — ABNORMAL HIGH (ref 70–99)
Potassium: 4.4 mmol/L (ref 3.5–5.1)
Sodium: 139 mmol/L (ref 135–145)
Total Bilirubin: 0.7 mg/dL (ref 0.3–1.2)
Total Protein: 6.9 g/dL (ref 6.5–8.1)

## 2021-07-10 LAB — CBC WITH DIFFERENTIAL/PLATELET
Abs Immature Granulocytes: 0.04 10*3/uL (ref 0.00–0.07)
Basophils Absolute: 0.1 10*3/uL (ref 0.0–0.1)
Basophils Relative: 1 %
Eosinophils Absolute: 0.6 10*3/uL — ABNORMAL HIGH (ref 0.0–0.5)
Eosinophils Relative: 5 %
HCT: 38.3 % (ref 36.0–46.0)
Hemoglobin: 12.3 g/dL (ref 12.0–15.0)
Immature Granulocytes: 0 %
Lymphocytes Relative: 31 %
Lymphs Abs: 3.3 10*3/uL (ref 0.7–4.0)
MCH: 29.1 pg (ref 26.0–34.0)
MCHC: 32.1 g/dL (ref 30.0–36.0)
MCV: 90.8 fL (ref 80.0–100.0)
Monocytes Absolute: 0.8 10*3/uL (ref 0.1–1.0)
Monocytes Relative: 8 %
Neutro Abs: 5.9 10*3/uL (ref 1.7–7.7)
Neutrophils Relative %: 55 %
Platelets: 350 10*3/uL (ref 150–400)
RBC: 4.22 MIL/uL (ref 3.87–5.11)
RDW: 14.5 % (ref 11.5–15.5)
WBC: 10.7 10*3/uL — ABNORMAL HIGH (ref 4.0–10.5)
nRBC: 0 % (ref 0.0–0.2)

## 2021-07-10 LAB — GLUCOSE, CAPILLARY
Glucose-Capillary: 161 mg/dL — ABNORMAL HIGH (ref 70–99)
Glucose-Capillary: 139 mg/dL — ABNORMAL HIGH (ref 70–99)
Glucose-Capillary: 160 mg/dL — ABNORMAL HIGH (ref 70–99)
Glucose-Capillary: 183 mg/dL — ABNORMAL HIGH (ref 70–99)
Glucose-Capillary: 208 mg/dL — ABNORMAL HIGH (ref 70–99)

## 2021-07-10 LAB — SURGICAL PCR SCREEN
MRSA, PCR: NEGATIVE
Staphylococcus aureus: NEGATIVE

## 2021-07-10 LAB — HEMOGLOBIN A1C
Hgb A1c MFr Bld: 7.3 % — ABNORMAL HIGH (ref 4.8–5.6)
Mean Plasma Glucose: 162.81 mg/dL

## 2021-07-10 LAB — PROTIME-INR
INR: 1.8 — ABNORMAL HIGH (ref 0.8–1.2)
Prothrombin Time: 20.9 seconds — ABNORMAL HIGH (ref 11.4–15.2)

## 2021-07-10 SURGERY — OPEN REDUCTION INTERNAL FIXATION (ORIF) TIBIA FRACTURE
Anesthesia: General | Site: Leg Lower | Laterality: Right

## 2021-07-10 MED ORDER — KETAMINE HCL 50 MG/5ML IJ SOSY
PREFILLED_SYRINGE | INTRAMUSCULAR | Status: AC
  Filled 2021-07-10: qty 5

## 2021-07-10 MED ORDER — FENTANYL CITRATE (PF) 100 MCG/2ML IJ SOLN
25.0000 ug | INTRAMUSCULAR | Status: DC | PRN
  Administered 2021-07-10 (×4): 25 ug via INTRAVENOUS

## 2021-07-10 MED ORDER — HYDRALAZINE HCL 20 MG/ML IJ SOLN
10.0000 mg | Freq: Once | INTRAMUSCULAR | Status: AC
  Administered 2021-07-10: 10 mg via INTRAVENOUS
  Filled 2021-07-10: qty 1

## 2021-07-10 MED ORDER — POVIDONE-IODINE 10 % EX SWAB: 2.0000 | Freq: Once | CUTANEOUS | Status: DC

## 2021-07-10 MED ORDER — METOCLOPRAMIDE HCL 5 MG/ML IJ SOLN: 5.0000 mg | Freq: Three times a day (TID) | INTRAMUSCULAR | Status: DC | PRN

## 2021-07-10 MED ORDER — LEVOTHYROXINE SODIUM 25 MCG PO TABS
25.0000 ug | ORAL_TABLET | Freq: Every day | ORAL | Status: DC
  Administered 2021-07-11 – 2021-07-12 (×2): 25 ug via ORAL
  Filled 2021-07-10 (×3): qty 1

## 2021-07-10 MED ORDER — PHENYLEPHRINE 80 MCG/ML (10ML) SYRINGE FOR IV PUSH (FOR BLOOD PRESSURE SUPPORT)
PREFILLED_SYRINGE | INTRAVENOUS | Status: DC | PRN
  Administered 2021-07-10: 160 ug via INTRAVENOUS

## 2021-07-10 MED ORDER — ONDANSETRON HCL 4 MG/2ML IJ SOLN
INTRAMUSCULAR | Status: AC
  Filled 2021-07-10: qty 2

## 2021-07-10 MED ORDER — ACETAMINOPHEN 325 MG PO TABS
325.0000 mg | ORAL_TABLET | Freq: Four times a day (QID) | ORAL | Status: DC | PRN
  Filled 2021-07-10: qty 2

## 2021-07-10 MED ORDER — FENTANYL CITRATE (PF) 100 MCG/2ML IJ SOLN
INTRAMUSCULAR | Status: DC | PRN
  Administered 2021-07-10 (×3): 50 ug via INTRAVENOUS
  Administered 2021-07-10 (×2): 100 ug via INTRAVENOUS

## 2021-07-10 MED ORDER — DOCUSATE SODIUM 100 MG PO CAPS
100.0000 mg | ORAL_CAPSULE | Freq: Two times a day (BID) | ORAL | Status: DC
  Administered 2021-07-10 – 2021-07-12 (×4): 100 mg via ORAL
  Filled 2021-07-10 (×5): qty 1

## 2021-07-10 MED ORDER — METHOCARBAMOL 500 MG PO TABS
1000.0000 mg | ORAL_TABLET | Freq: Three times a day (TID) | ORAL | Status: DC
  Administered 2021-07-10 – 2021-07-12 (×7): 1000 mg via ORAL
  Filled 2021-07-10 (×7): qty 2

## 2021-07-10 MED ORDER — CARVEDILOL 6.25 MG PO TABS
6.2500 mg | ORAL_TABLET | Freq: Two times a day (BID) | ORAL | Status: DC
  Administered 2021-07-10 – 2021-07-12 (×5): 6.25 mg via ORAL
  Filled 2021-07-10 (×5): qty 1

## 2021-07-10 MED ORDER — CEFAZOLIN SODIUM-DEXTROSE 2-4 GM/100ML-% IV SOLN
2.0000 g | Freq: Four times a day (QID) | INTRAVENOUS | Status: AC
  Administered 2021-07-10 – 2021-07-11 (×3): 2 g via INTRAVENOUS
  Filled 2021-07-10 (×3): qty 100

## 2021-07-10 MED ORDER — RIVAROXABAN 15 MG PO TABS
15.0000 mg | ORAL_TABLET | Freq: Two times a day (BID) | ORAL | Status: DC
  Administered 2021-07-10 – 2021-07-12 (×4): 15 mg via ORAL
  Filled 2021-07-10 (×5): qty 1

## 2021-07-10 MED ORDER — ONDANSETRON HCL 4 MG/2ML IJ SOLN
INTRAMUSCULAR | Status: DC | PRN
  Administered 2021-07-10: 4 mg via INTRAVENOUS

## 2021-07-10 MED ORDER — 0.9 % SODIUM CHLORIDE (POUR BTL) OPTIME
TOPICAL | Status: DC | PRN
  Administered 2021-07-10: 1000 mL

## 2021-07-10 MED ORDER — INSULIN ASPART 100 UNIT/ML IJ SOLN
INTRAMUSCULAR | Status: AC
  Filled 2021-07-10: qty 1

## 2021-07-10 MED ORDER — LIDOCAINE 2% (20 MG/ML) 5 ML SYRINGE
INTRAMUSCULAR | Status: DC | PRN
  Administered 2021-07-10: 60 mg via INTRAVENOUS

## 2021-07-10 MED ORDER — ACETAMINOPHEN 500 MG PO TABS
1000.0000 mg | ORAL_TABLET | Freq: Once | ORAL | Status: AC
  Administered 2021-07-10: 1000 mg via ORAL
  Filled 2021-07-10: qty 2

## 2021-07-10 MED ORDER — METHOCARBAMOL 1000 MG/10ML IJ SOLN
500.0000 mg | Freq: Three times a day (TID) | INTRAVENOUS | Status: DC
  Filled 2021-07-10: qty 5

## 2021-07-10 MED ORDER — KETAMINE HCL 10 MG/ML IJ SOLN
INTRAMUSCULAR | Status: DC | PRN
  Administered 2021-07-10: 20 mg via INTRAVENOUS
  Administered 2021-07-10: 30 mg via INTRAVENOUS

## 2021-07-10 MED ORDER — PREDNISOLONE ACETATE 1 % OP SUSP
4.0000 [drp] | Freq: Every day | OPHTHALMIC | Status: DC
  Filled 2021-07-10: qty 5

## 2021-07-10 MED ORDER — FENTANYL CITRATE (PF) 250 MCG/5ML IJ SOLN
INTRAMUSCULAR | Status: AC
  Filled 2021-07-10: qty 5

## 2021-07-10 MED ORDER — DEXAMETHASONE SODIUM PHOSPHATE 10 MG/ML IJ SOLN
INTRAMUSCULAR | Status: AC
  Filled 2021-07-10: qty 1

## 2021-07-10 MED ORDER — MIDAZOLAM HCL 2 MG/2ML IJ SOLN
INTRAMUSCULAR | Status: AC
  Filled 2021-07-10: qty 2

## 2021-07-10 MED ORDER — METOCLOPRAMIDE HCL 5 MG PO TABS: 5.0000 mg | ORAL_TABLET | Freq: Three times a day (TID) | ORAL | Status: DC | PRN

## 2021-07-10 MED ORDER — SUGAMMADEX SODIUM 200 MG/2ML IV SOLN
INTRAVENOUS | Status: DC | PRN
  Administered 2021-07-10: 200 mg via INTRAVENOUS

## 2021-07-10 MED ORDER — HYDRALAZINE HCL 20 MG/ML IJ SOLN: 10.0000 mg | Freq: Three times a day (TID) | INTRAMUSCULAR | Status: DC | PRN

## 2021-07-10 MED ORDER — OXYCODONE HCL 5 MG PO TABS
5.0000 mg | ORAL_TABLET | ORAL | Status: DC | PRN
  Administered 2021-07-10: 5 mg via ORAL
  Administered 2021-07-12: 10 mg via ORAL
  Filled 2021-07-10: qty 1
  Filled 2021-07-10: qty 2

## 2021-07-10 MED ORDER — POTASSIUM CHLORIDE IN NACL 20-0.9 MEQ/L-% IV SOLN
INTRAVENOUS | Status: DC
  Filled 2021-07-10: qty 1000

## 2021-07-10 MED ORDER — DEXAMETHASONE SODIUM PHOSPHATE 10 MG/ML IJ SOLN
INTRAMUSCULAR | Status: DC | PRN
  Administered 2021-07-10: 5 mg via INTRAVENOUS

## 2021-07-10 MED ORDER — HYDROMORPHONE HCL 1 MG/ML IJ SOLN
0.5000 mg | INTRAMUSCULAR | Status: DC | PRN
  Administered 2021-07-10 – 2021-07-11 (×4): 1 mg via INTRAVENOUS
  Filled 2021-07-10 (×4): qty 1

## 2021-07-10 MED ORDER — CEFAZOLIN SODIUM-DEXTROSE 2-4 GM/100ML-% IV SOLN
2.0000 g | INTRAVENOUS | Status: AC
  Administered 2021-07-10: 2 g via INTRAVENOUS
  Filled 2021-07-10: qty 100

## 2021-07-10 MED ORDER — ONDANSETRON HCL 4 MG PO TABS: 4.0000 mg | ORAL_TABLET | Freq: Four times a day (QID) | ORAL | Status: DC | PRN

## 2021-07-10 MED ORDER — ONDANSETRON 4 MG PO TBDP: 4.0000 mg | ORAL_TABLET | Freq: Three times a day (TID) | ORAL | Status: DC | PRN

## 2021-07-10 MED ORDER — LACTATED RINGERS IV SOLN: INTRAVENOUS | Status: DC | PRN

## 2021-07-10 MED ORDER — ACETAMINOPHEN 500 MG PO TABS
1000.0000 mg | ORAL_TABLET | Freq: Four times a day (QID) | ORAL | Status: DC
  Administered 2021-07-10 – 2021-07-12 (×9): 1000 mg via ORAL
  Filled 2021-07-10 (×9): qty 2

## 2021-07-10 MED ORDER — OXYCODONE HCL 5 MG PO TABS
ORAL_TABLET | ORAL | Status: AC
  Filled 2021-07-10: qty 2

## 2021-07-10 MED ORDER — FENTANYL CITRATE (PF) 100 MCG/2ML IJ SOLN
INTRAMUSCULAR | Status: AC
  Filled 2021-07-10: qty 2

## 2021-07-10 MED ORDER — CHLORHEXIDINE GLUCONATE 4 % EX LIQD: 60.0000 mL | Freq: Once | CUTANEOUS | Status: DC

## 2021-07-10 MED ORDER — SCOPOLAMINE 1 MG/3DAYS TD PT72
MEDICATED_PATCH | TRANSDERMAL | Status: DC | PRN
  Administered 2021-07-10: 1 via TRANSDERMAL

## 2021-07-10 MED ORDER — ONDANSETRON HCL 4 MG/2ML IJ SOLN: 4.0000 mg | Freq: Four times a day (QID) | INTRAMUSCULAR | Status: DC | PRN

## 2021-07-10 MED ORDER — INSULIN ASPART 100 UNIT/ML IJ SOLN
0.0000 [IU] | Freq: Three times a day (TID) | INTRAMUSCULAR | Status: DC
  Administered 2021-07-10: 5 [IU] via SUBCUTANEOUS
  Administered 2021-07-11 (×2): 2 [IU] via SUBCUTANEOUS
  Administered 2021-07-11: 5 [IU] via SUBCUTANEOUS
  Administered 2021-07-12 (×2): 3 [IU] via SUBCUTANEOUS

## 2021-07-10 MED ORDER — MIDAZOLAM HCL 2 MG/2ML IJ SOLN
INTRAMUSCULAR | Status: DC | PRN
  Administered 2021-07-10: 2 mg via INTRAVENOUS

## 2021-07-10 MED ORDER — PROPOFOL 10 MG/ML IV BOLUS
INTRAVENOUS | Status: AC
  Filled 2021-07-10: qty 20

## 2021-07-10 MED ORDER — CHLORHEXIDINE GLUCONATE 0.12 % MT SOLN
OROMUCOSAL | Status: AC
  Administered 2021-07-10: 15 mL
  Filled 2021-07-10: qty 15

## 2021-07-10 MED ORDER — OXYCODONE HCL 5 MG PO TABS
10.0000 mg | ORAL_TABLET | ORAL | Status: DC | PRN
  Administered 2021-07-10 – 2021-07-11 (×2): 10 mg via ORAL
  Administered 2021-07-12 (×2): 15 mg via ORAL
  Filled 2021-07-10: qty 3
  Filled 2021-07-10: qty 2
  Filled 2021-07-10: qty 3

## 2021-07-10 MED ORDER — MOMETASONE FURO-FORMOTEROL FUM 200-5 MCG/ACT IN AERO
2.0000 | INHALATION_SPRAY | Freq: Two times a day (BID) | RESPIRATORY_TRACT | Status: DC
  Administered 2021-07-10 – 2021-07-12 (×4): 2 via RESPIRATORY_TRACT
  Filled 2021-07-10: qty 8.8

## 2021-07-10 MED ORDER — ALBUTEROL SULFATE HFA 108 (90 BASE) MCG/ACT IN AERS: 2.0000 | INHALATION_SPRAY | Freq: Four times a day (QID) | RESPIRATORY_TRACT | Status: DC | PRN

## 2021-07-10 MED ORDER — ROCURONIUM BROMIDE 10 MG/ML (PF) SYRINGE
PREFILLED_SYRINGE | INTRAVENOUS | Status: DC | PRN
  Administered 2021-07-10: 100 mg via INTRAVENOUS

## 2021-07-10 MED ORDER — ALBUTEROL SULFATE (2.5 MG/3ML) 0.083% IN NEBU: 2.5000 mg | INHALATION_SOLUTION | RESPIRATORY_TRACT | Status: DC | PRN

## 2021-07-10 MED ORDER — SCOPOLAMINE 1 MG/3DAYS TD PT72
MEDICATED_PATCH | TRANSDERMAL | Status: AC
  Filled 2021-07-10: qty 1

## 2021-07-10 MED ORDER — PROPOFOL 10 MG/ML IV BOLUS
INTRAVENOUS | Status: DC | PRN
  Administered 2021-07-10: 120 mg via INTRAVENOUS

## 2021-07-10 MED ORDER — INSULIN ASPART 100 UNIT/ML IJ SOLN
0.0000 [IU] | INTRAMUSCULAR | Status: DC | PRN
  Administered 2021-07-10: 2 [IU] via SUBCUTANEOUS

## 2021-07-10 MED ORDER — BUPROPION HCL ER (XL) 150 MG PO TB24
300.0000 mg | ORAL_TABLET | Freq: Every day | ORAL | Status: DC
  Administered 2021-07-10 – 2021-07-12 (×3): 300 mg via ORAL
  Filled 2021-07-10 (×3): qty 2

## 2021-07-10 MED ORDER — PANTOPRAZOLE SODIUM 40 MG PO TBEC
40.0000 mg | DELAYED_RELEASE_TABLET | Freq: Every day | ORAL | Status: DC
Start: 2021-07-10 — End: 2021-07-12
  Administered 2021-07-10 – 2021-07-12 (×3): 40 mg via ORAL
  Filled 2021-07-10 (×3): qty 1

## 2021-07-10 SURGICAL SUPPLY — 88 items
BAG COUNTER SPONGE SURGICOUNT (BAG) ×2 IMPLANT
BAG SPNG CNTER NS LX DISP (BAG) ×1
BANDAGE ESMARK 6X9 LF (GAUZE/BANDAGES/DRESSINGS) ×1 IMPLANT
BIT DRILL CAL (BIT) IMPLANT
BLADE CLIPPER SURG (BLADE) IMPLANT
BNDG CMPR 9X6 STRL LF SNTH (GAUZE/BANDAGES/DRESSINGS) ×1
BNDG COHESIVE 4X5 TAN STRL (GAUZE/BANDAGES/DRESSINGS) IMPLANT
BNDG ELASTIC 4X5.8 VLCR STR LF (GAUZE/BANDAGES/DRESSINGS) ×3 IMPLANT
BNDG ELASTIC 6X5.8 VLCR STR LF (GAUZE/BANDAGES/DRESSINGS) ×2 IMPLANT
BNDG ESMARK 6X9 LF (GAUZE/BANDAGES/DRESSINGS) ×2
BNDG GAUZE DERMACEA FLUFF (GAUZE/BANDAGES/DRESSINGS) ×1
BNDG GAUZE DERMACEA FLUFF 4 (GAUZE/BANDAGES/DRESSINGS) IMPLANT
BNDG GAUZE ELAST 4 BULKY (GAUZE/BANDAGES/DRESSINGS) ×2 IMPLANT
BNDG GZE DERMACEA 4 6PLY (GAUZE/BANDAGES/DRESSINGS) ×1
BONE CANC CHIPS 40CC CAN1/2 (Bone Implant) ×2 IMPLANT
BRUSH SCRUB EZ PLAIN DRY (MISCELLANEOUS) ×6 IMPLANT
CHIPS CANC BONE 40CC CAN1/2 (Bone Implant) ×1 IMPLANT
COVER MAYO STAND STRL (DRAPES) ×1 IMPLANT
DRAPE C-ARM 42X72 X-RAY (DRAPES) ×2 IMPLANT
DRAPE C-ARMOR (DRAPES) ×2 IMPLANT
DRAPE HALF SHEET 40X57 (DRAPES) ×4 IMPLANT
DRAPE INCISE IOBAN 66X45 STRL (DRAPES) ×3 IMPLANT
DRAPE U-SHAPE 47X51 STRL (DRAPES) ×2 IMPLANT
DRILL BIT CAL (BIT) ×2
DRSG ADAPTIC 3X8 NADH LF (GAUZE/BANDAGES/DRESSINGS) ×1 IMPLANT
DRSG MEPITEL 4X7.2 (GAUZE/BANDAGES/DRESSINGS) ×1 IMPLANT
DRSG PAD ABDOMINAL 8X10 ST (GAUZE/BANDAGES/DRESSINGS) ×4 IMPLANT
ELECT REM PT RETURN 9FT ADLT (ELECTROSURGICAL)
ELECTRODE REM PT RTRN 9FT ADLT (ELECTROSURGICAL) ×2 IMPLANT
GAUZE SPONGE 4X4 12PLY STRL (GAUZE/BANDAGES/DRESSINGS) ×3 IMPLANT
GLOVE BIO SURGEON STRL SZ7.5 (GLOVE) ×1 IMPLANT
GLOVE BIO SURGEON STRL SZ8 (GLOVE) ×3 IMPLANT
GLOVE BIOGEL PI IND STRL 7.5 (GLOVE) ×2 IMPLANT
GLOVE BIOGEL PI IND STRL 8 (GLOVE) ×1 IMPLANT
GLOVE BIOGEL PI INDICATOR 7.5 (GLOVE) ×2
GLOVE BIOGEL PI INDICATOR 8 (GLOVE) ×1
GLOVE ORTHO TXT STRL SZ7.5 (GLOVE) ×1 IMPLANT
GLOVE SURG ORTHO LTX SZ7.5 (GLOVE) ×4 IMPLANT
GLOVE XGUARD RR 2 7.5 (GLOVE) ×1 IMPLANT
GLOVE XGUARD RR2 7.5 (GLOVE)
GOWN STRL REUS W/ TWL LRG LVL3 (GOWN DISPOSABLE) ×2 IMPLANT
GOWN STRL REUS W/ TWL XL LVL3 (GOWN DISPOSABLE) ×1 IMPLANT
GOWN STRL REUS W/TWL LRG LVL3 (GOWN DISPOSABLE)
GOWN STRL REUS W/TWL XL LVL3 (GOWN DISPOSABLE) ×4
GRAFT BNE CHIP CANC 1-8 40 (Bone Implant) IMPLANT
K-WIRE ACE 1.6X6 (WIRE) ×6
KIT BASIN OR (CUSTOM PROCEDURE TRAY) ×3 IMPLANT
KIT TURNOVER KIT B (KITS) ×2 IMPLANT
KWIRE ACE 1.6X6 (WIRE) IMPLANT
MANIFOLD NEPTUNE II (INSTRUMENTS) ×2 IMPLANT
NDL 1/2 CIR CATGUT .05X1.09 (NEEDLE) IMPLANT
NEEDLE 1/2 CIR CATGUT .05X1.09 (NEEDLE) ×2 IMPLANT
NS IRRIG 1000ML POUR BTL (IV SOLUTION) ×2 IMPLANT
PACK ORTHO EXTREMITY (CUSTOM PROCEDURE TRAY) ×2 IMPLANT
PAD ABD 8X10 STRL (GAUZE/BANDAGES/DRESSINGS) ×2 IMPLANT
PAD ARMBOARD 7.5X6 YLW CONV (MISCELLANEOUS) ×6 IMPLANT
PAD CAST 4YDX4 CTTN HI CHSV (CAST SUPPLIES) ×1 IMPLANT
PADDING CAST COTTON 4X4 STRL (CAST SUPPLIES) ×2
PADDING CAST COTTON 6X4 STRL (CAST SUPPLIES) ×2 IMPLANT
PLATE LOCK 5H STD RT PROX TIB (Plate) ×1 IMPLANT
SCREW LOCK CORT STAR 3.5X60 (Screw) ×1 IMPLANT
SCREW LOCK CORT STAR 3.5X65 (Screw) ×1 IMPLANT
SCREW LOCK CORT STAR 3.5X70 (Screw) ×4 IMPLANT
SCREW LOCK CORT STAR 3.5X75 (Screw) ×1 IMPLANT
SCREW LOW PROF TIS 3.5X28MM (Screw) ×1 IMPLANT
SCREW LOW PROFILE 3.5X30MM TIS (Screw) ×1 IMPLANT
SCREW LP NL T15 3.5X26 (Screw) ×1 IMPLANT
SCREW T15 MD 3.5X60MM NS (Screw) ×1 IMPLANT
SPONGE T-LAP 18X18 ~~LOC~~+RFID (SPONGE) IMPLANT
STAPLER VISISTAT 35W (STAPLE) ×2 IMPLANT
STOCKINETTE IMPERVIOUS LG (DRAPES) IMPLANT
SUCTION FRAZIER HANDLE 10FR (MISCELLANEOUS) ×4
SUCTION TUBE FRAZIER 10FR DISP (MISCELLANEOUS) ×2 IMPLANT
SUT BONE WAX W31G (SUTURE) ×1 IMPLANT
SUT ETHILON 2 0 FS 18 (SUTURE) IMPLANT
SUT PROLENE 0 CT 2 (SUTURE) ×2 IMPLANT
SUT VIC AB 0 CT1 27 (SUTURE) ×8
SUT VIC AB 0 CT1 27XBRD ANBCTR (SUTURE) ×1 IMPLANT
SUT VIC AB 1 CT1 27 (SUTURE)
SUT VIC AB 1 CT1 27XBRD ANBCTR (SUTURE) ×1 IMPLANT
SUT VIC AB 2-0 CT1 27 (SUTURE) ×4
SUT VIC AB 2-0 CT1 TAPERPNT 27 (SUTURE) ×2 IMPLANT
TOWEL GREEN STERILE (TOWEL DISPOSABLE) ×2 IMPLANT
TOWEL GREEN STERILE FF (TOWEL DISPOSABLE) ×3 IMPLANT
TRAY FOLEY MTR SLVR 16FR STAT (SET/KITS/TRAYS/PACK) IMPLANT
TUBE CONNECTING 12X1/4 (SUCTIONS) ×2 IMPLANT
WATER STERILE IRR 1000ML POUR (IV SOLUTION) ×2 IMPLANT
YANKAUER SUCT BULB TIP NO VENT (SUCTIONS) ×1 IMPLANT

## 2021-07-10 NOTE — H&P (Signed)
[facility] H&P  Patient ID: [name] MRN: [id number] DOB/AGE: 1965-05-09 56 y.o.  Chief Complaint: right tibial plateau fracture HPI: [name] is an 56 y.o. female.who sustained ground level fall resulting in right tibial plateau fracture. Subsequent pain and swelling prompted ultrasound which showed DVT from popliteal down. Pain is dull 4/10 worse with motion. No prior injury nor intervention.  Past Medical History:  Diagnosis Date   ADHD (attention deficit hyperactivity disorder)    ADD - no meds   Allergy    allergic rhinitis   Anxiety    Asthma    Chronic headaches    Degenerative disc disease    Depression    Diabetes mellitus without complication ([place])    type 2   Eczema    no current problems as of 07/09/21   Fatty liver    Fibromyalgia    Fx of fibula 2009   GERD (gastroesophageal reflux disease)    Hemorrhoids    Hypertension    Hyperthyroidism    Hypothyroidism    IBS (irritable bowel syndrome)    Iritis, chronic    Macular hole of left eye 06/2017   PONV (postoperative nausea and vomiting)    Sleep apnea    uses CPAP nightly    Past Surgical History:  Procedure Laterality Date   ABDOMINAL HYSTERECTOMY     ANTERIOR CRUCIATE LIGAMENT REPAIR Left 1996   small tear of ACL   BREAST EXCISIONAL BIOPSY Left 1995   BREAST SURGERY     left nipple removed - benign tumor   BUNIONECTOMY Bilateral    COLONOSCOPY     EYE SURGERY     cataracts removed, macular hole repaired left eye- both surgeries done at [facility]  08/2004   endometriosis - several    WISDOM TOOTH EXTRACTION      Family History  Problem Relation Age of Onset   Hypertension Mother    Thyroid disease Mother    Fibromyalgia Mother    Colon polyps Mother    Diabetes Father    Kidney cancer Maternal Grandmother    Colon cancer Neg Hx    Esophageal cancer Neg Hx    Stomach cancer Neg Hx    Rectal cancer Neg Hx    Allergic rhinitis  Neg Hx    Angioedema Neg Hx    Asthma Neg Hx    Atopy Neg Hx    Eczema Neg Hx    Immunodeficiency Neg Hx    Urticaria Neg Hx    Social History:  reports that she has never smoked. She has never used smokeless tobacco. She reports that she does not drink alcohol and does not use drugs.  Allergies:  Allergies  Allergen Reactions   Amlodipine     Pedal edema    Cetirizine Hcl     REACTION: not effective   Erythromycin     REACTION: stomach hurts   Escitalopram Oxalate     REACTION: no improvement   Lisinopril     REACTION: cough   Loratadine     REACTION: not effective   Nifedipine     REACTION: fatigued and tremor   Sertraline Hcl     REACTION: no help   Vyvanse [Lisdexamfetamine Dimesylate]     Headache and excitablility    Medications Prior to Admission  Medication Sig Dispense Refill   acetaminophen (TYLENOL) 500 MG tablet OTC as directed.     Adalimumab 40 MG/0.8ML PSKT Inject  40 mg into the skin once a week.      albuterol (VENTOLIN HFA) 108 (90 Base) MCG/ACT inhaler Inhale 2 puffs into the lungs every 6 (six) hours as needed for wheezing or shortness of breath. 18 g 1   BD VEO INSULIN SYRINGE U/F 31G X 15/64" 1 ML MISC      buPROPion (WELLBUTRIN XL) 300 MG 24 hr tablet Take 1 tablet by mouth once daily 90 tablet 3   carvedilol (COREG) 6.25 MG tablet Take 1 tablet (6.25 mg total) by mouth 2 (two) times daily with a meal. 180 tablet 3   Cholecalciferol (VITAMIN D3 PO) Take 1 capsule by mouth daily.     [employer] TRIPLE MAGNESIUM COMPLEX PO Take 1 capsule by mouth daily.     D-5000 125 MCG (5000 UT) TABS SMARTSIG:1 By Mouth     diphenhydrAMINE (BENADRYL) 25 mg capsule Take 25 mg by mouth as needed. OTC as directed.     esomeprazole (NEXIUM) 20 MG capsule Take 1 capsule (20 mg total) by mouth daily at 12 noon. (Patient taking differently: Take 20 mg by mouth at bedtime.) 90 capsule 3   fluticasone (FLONASE) 50 MCG/ACT nasal spray Place 2 sprays into both nostrils daily.      folic acid (FOLVITE) 1 MG tablet Take 3 mg by mouth 4 (four) times a week.      HUMIRA PEN 40 MG/0.8ML PNKT SMARTSIG:1 SUB-Q     hydrochlorothiazide (MICROZIDE) 12.5 MG capsule Take 1 capsule by mouth once daily 90 capsule 0   ketorolac (ACULAR) 0.5 % ophthalmic solution 1 drop 4 (four) times daily.     leucovorin (WELLCOVORIN) 5 MG tablet Take 5 mg by mouth 2 (two) times a week. Sunday and Tuesday     levothyroxine (SYNTHROID) 25 MCG tablet Take 1 tablet (25 mcg total) by mouth daily. 90 tablet 3   magnesium oxide (MAG-OX) 400 MG tablet Take 400 mg by mouth daily.     metFORMIN (GLUCOPHAGE) 1000 MG tablet TAKE 1 TABLET BY MOUTH TWICE DAILY WITH A MEAL 180 tablet 1   methocarbamol (ROBAXIN) 500 MG tablet Take 1 tablet (500 mg total) by mouth every 8 (eight) hours as needed for muscle spasms. Caution of sedation (Patient taking differently: Take 500 mg by mouth every 8 (eight) hours as needed for muscle spasms. Caution of sedation - tx fibromyalgia) 30 tablet 1   Methotrexate 25 MG/ML SOSY Inject 25 mg into the skin once a week.     methotrexate 250 MG/10ML injection Inject into the skin.     Multiple Vitamins-Minerals (EMERGEN-C IMMUNE PLUS PO) Take 50 mg by mouth 2 times daily at 12 noon and 4 pm.     ondansetron (ZOFRAN-ODT) 4 MG disintegrating tablet Take 1 tablet (4 mg total) by mouth every 8 (eight) hours as needed for nausea or vomiting. 8 tablet 0   oxyCODONE-acetaminophen (PERCOCET/ROXICET) 5-325 MG tablet Take 1 tablet by mouth every 4 (four) hours as needed. 15 tablet 0   prednisoLONE acetate (PRED FORTE) 1 % ophthalmic suspension Place 4 drops into both eyes daily.     RIVAROXABAN (XARELTO) VTE STARTER PACK (15 & 20 MG) Follow package directions: Take one '15mg'$  tablet by mouth twice a day. On day 22, switch to one '20mg'$  tablet once a day. Take with food. 51 each 0   SYMBICORT 160-4.5 MCG/ACT inhaler Inhale 2 puffs into the lungs 2 (two) times daily. 10.2 g 5   SYMBICORT 80-4.5 MCG/ACT  inhaler SMARTSIG:1 Via Inhaler  Daily     TYLENOL 325 MG tablet SMARTSIG:1 By Mouth     albuterol (PROVENTIL) (2.5 MG/3ML) 0.083% nebulizer solution Take 3 mLs (2.5 mg total) by nebulization every 4 (four) hours as needed for wheezing or shortness of breath. 75 mL 1   Black Elderberry,Berry-Flower, 575 MG CAPS Take by mouth.     cephALEXin (KEFLEX) 500 MG capsule Take 1 capsule (500 mg total) by mouth 3 (three) times daily. 21 capsule 0   cyclobenzaprine (FLEXERIL) 10 MG tablet Take 1 tablet (10 mg total) by mouth 3 (three) times daily as needed for muscle spasms. Caution of sedation 30 tablet 0   glucose blood (ONETOUCH VERIO) test strip To check glucose daily and prn for diabetes type 2 100 each 3   Insulin Syringe-Needle U-100 (INSULIN SYRINGE 1CC/31GX5/16") 31G X 5/16" 1 ML MISC See admin instructions.     Lancets (ONETOUCH DELICA PLUS PPIRJJ88C) MISC SMARTSIG:1 Lancet(s) Topical Twice Daily PRN     mometasone (ELOCON) 0.1 % cream Apply topically.     rosuvastatin (CRESTOR) 5 MG tablet Take 1 tablet (5 mg total) by mouth every other day. (Patient taking differently: Take 5 mg by mouth every other day. Sun Wed Fri) 45 tablet 3   SINGULAIR 10 MG tablet       Results for orders placed or performed during the hospital encounter of 07/10/21 (from the past 48 hour(s))  Glucose, capillary     Status: Abnormal   Collection Time: 07/10/21  6:00 AM  Result Value Ref Range   Glucose-Capillary 161 (H) 70 - 99 mg/dL    Comment: Glucose reference range applies only to samples taken after fasting for at least 8 hours.  Surgical pcr screen     Status: None   Collection Time: 07/10/21  6:25 AM   Specimen: Nasal Mucosa; Nasal Swab  Result Value Ref Range   MRSA, PCR NEGATIVE NEGATIVE   Staphylococcus aureus NEGATIVE NEGATIVE    Comment: (NOTE) The Xpert SA Assay (FDA approved for NASAL specimens in patients 52 years of age and older), is one component of a comprehensive surveillance program. It is not  intended to diagnose infection nor to guide or monitor treatment. Performed at [facility], [place] [address], [place], [place] [address]   CBC WITH DIFFERENTIAL     Status: Abnormal   Collection Time: 07/10/21  6:56 AM  Result Value Ref Range   WBC 10.7 (H) 4.0 - 10.5 K/uL   RBC 4.22 3.87 - 5.11 MIL/uL   Hemoglobin 12.3 12.0 - 15.0 g/dL   HCT 38.3 36.0 - 46.0 %   MCV 90.8 80.0 - 100.0 fL   MCH 29.1 26.0 - 34.0 pg   MCHC 32.1 30.0 - 36.0 g/dL   RDW 14.5 11.5 - 15.5 %   Platelets 350 150 - 400 K/uL   nRBC 0.0 0.0 - 0.2 %   Neutrophils Relative % 55 %   Neutro Abs 5.9 1.7 - 7.7 K/uL   Lymphocytes Relative 31 %   Lymphs Abs 3.3 0.7 - 4.0 K/uL   Monocytes Relative 8 %   Monocytes Absolute 0.8 0.1 - 1.0 K/uL   Eosinophils Relative 5 %   Eosinophils Absolute 0.6 (H) 0.0 - 0.5 K/uL   Basophils Relative 1 %   Basophils Absolute 0.1 0.0 - 0.1 K/uL   Immature Granulocytes 0 %   Abs Immature Granulocytes 0.04 0.00 - 0.07 K/uL    Comment: Performed at [facility], [address], [place], [place] [address]  Comprehensive metabolic panel     Status: Abnormal   Collection Time: 07/10/21  6:56 AM  Result Value Ref Range   Sodium 139 135 - 145 mmol/L   Potassium 4.4 3.5 - 5.1 mmol/L   Chloride 102 98 - 111 mmol/L   CO2 25 22 - 32 mmol/L   Glucose, Bld 155 (H) 70 - 99 mg/dL    Comment: Glucose reference range applies only to samples taken after fasting for at least 8 hours.   BUN 12 6 - 20 mg/dL   Creatinine, Ser 0.84 0.44 - 1.00 mg/dL   Calcium 9.5 8.9 - 10.3 mg/dL   Total Protein 6.9 6.5 - 8.1 g/dL   Albumin 3.3 (L) 3.5 - 5.0 g/dL   AST 26 15 - 41 U/L   ALT 12 0 - 44 U/L   Alkaline Phosphatase 77 38 - 126 U/L   Total Bilirubin 0.7 0.3 - 1.2 mg/dL   GFR, Estimated >60 >60 mL/min    Comment: (NOTE) Calculated using the CKD-EPI Creatinine Equation (2021)    Anion gap 12 5 - 15    Comment: Performed at [facility] [address] [place], [place] [address]   Protime-INR     Status: Abnormal   Collection Time: 07/10/21  6:56 AM  Result Value Ref Range   Prothrombin Time 20.9 (H) 11.4 - 15.2 seconds   INR 1.8 (H) 0.8 - 1.2    Comment: (NOTE) INR goal varies based on device and disease states. Performed at [facility], [place] [address] [place], [place] [address]    No results found.  ROS No recent fever, bleeding abnormalities, urologic dysfunction, GI problems, or weight gain. As above.   Blood pressure (!) 181/113, pulse 92, temperature 99 F (37.2 C), temperature source Oral, resp. rate 18, height '5\' 8"'$  (1.727 m), weight 93.6 kg, last menstrual period 08/05/2006, SpO2 98 %. Physical Exam NCAT except for eye issues as above RRR Lungs clear RLE Immobilizer intact, clean, dry  Edema/ swelling controlled  Sens: DPN, SPN, TN intact  Motor: EHL, FHL, and lessor toe ext and flex all intact grossly  Brisk cap refill, warm to touch   Assessment/Plan  Right tibial plateau fracture for ORIF today DM Prior bunion surgery Uveitis  I discussed with the patient the risks and benefits of surgery, including the possibility of infection, nerve injury, vessel injury, wound breakdown, arthritis, symptomatic hardware, DVT/ PE, loss of motion, malunion, nonunion, and need for further surgery among others. She acknowledged these risks and wished to proceed.    Altamese , MD [facility], [facility] [phone]  07/10/2021, 8:08 AM  [facility] [place] [phone] (F)

## 2021-07-10 NOTE — Anesthesia Postprocedure Evaluation (Signed)
Anesthesia Post Note  Patient: [name]  Procedure(s) Performed: OPEN REDUCTION INTERNAL FIXATION (ORIF) TIBIAL PLATEAU FRACTURE (Right: Leg Lower)     Patient location during evaluation: PACU Anesthesia Type: General Level of consciousness: awake and alert Pain management: pain level controlled Vital Signs Assessment: post-procedure vital signs reviewed and stable Respiratory status: spontaneous breathing, nonlabored ventilation, respiratory function stable and patient connected to nasal cannula oxygen Cardiovascular status: blood pressure returned to baseline and stable Postop Assessment: no apparent nausea or vomiting Anesthetic complications: no   No notable events documented.  Last Vitals:  Vitals:   07/10/21 1247 07/10/21 1448  BP: (!) 177/96 (!) 181/95  Pulse: 77 68  Resp: 18 19  Temp: 36.7 C 36.7 C  SpO2: 97% 97%    Last Pain:  Vitals:   07/10/21 1448  TempSrc: Oral  PainSc:                  Kashden Deboy L Nithin Demeo

## 2021-07-10 NOTE — Anesthesia Procedure Notes (Signed)
Procedure Name: Intubation Date/Time: 07/10/2021 8:25 AM  Performed by: Moshe Salisbury, CRNAPre-anesthesia Checklist: Patient identified, Emergency Drugs available, Suction available and Patient being monitored Patient Re-evaluated:Patient Re-evaluated prior to induction Oxygen Delivery Method: Circle System Utilized Preoxygenation: Pre-oxygenation with 100% oxygen Induction Type: IV induction Ventilation: Mask ventilation without difficulty Laryngoscope Size: Mac and 3 Grade View: Grade I Tube type: Oral Tube size: 7.5 mm Number of attempts: 1 Airway Equipment and Method: Stylet Placement Confirmation: ETT inserted through vocal cords under direct vision, positive ETCO2 and breath sounds checked- equal and bilateral Secured at: 21 cm Tube secured with: Tape Dental Injury: Teeth and Oropharynx as per pre-operative assessment

## 2021-07-10 NOTE — Op Note (Signed)
07/10/2021  4:35 PM  PATIENT:  [name]  1965-04-05 female   MEDICAL RECORD NUMBER: [id number]  PRE-OPERATIVE DIAGNOSIS:  RIGHT BICONDYLAR TIBIAL PLATEAU FRACTURE  POST-OPERATIVE DIAGNOSIS:   1. RIGHT BICONDYLAR TIBIAL PLATEAU FRACTURE 2. TEAR OF MENISCOCAPSULAR JUNCTION AT MID BODY  PROCEDURES: 1.  Open reduction internal fixation of right bicondylar tibial plateau. 2.  Arthrotomy with repair of meniscocapsular junction. 3.  Anterior compartment fasciotomy.  SURGEON:  Altamese [place], MD  ASSISTANT:  1. Ainsley Spinner, PA-C; 2. PA student  ANESTHESIA:  General.  COMPLICATIONS:  None.  TOURNIQUET:  None.  ESTIMATED BLOOD LOSS:  100 mL.  SPECIMENS:  None.  DRAINS:  None.  DISPOSITION:  To PACU.  CONDITION:  Stable.  BRIEF SUMMARY AND INDICATIONS FOR PROCEDURE:  The patient is a very pleasant 56 y.o. who sustained tibial plateau fracture in ground level fall resulting in swelling, pain, inability to bear weight.  Subsequent x-rays and CT scan demonstrated a lateral tibial plateau depression and was also associated with clinical instability in full extension on physical examination.  I discussed with the  patient risks and benefits of surgical repair, including the possibility of infection, nerve injury, vessel injury, DVT, PE, particularly given his medical history, as well as malunion, nonunion, symptomatic hardware, heart attack, stroke and other complications.  After acknowledging these risks, the patient provided consent to proceed.  BRIEF SUMMARY OF PROCEDURE:  The patient was taken to the operating room where general anesthesia was induced.  The operative lower extremity was prepped and draped in the usual sterile fashion with chlorhexidine wash, then Betadine scrub and paint.  I made a curvilinear incision over Gerdy's tubercle.  The retinaculum was incised proximal to the joint and then the coronary ligament incised along its base and the knee swung into varus to open up  the lateral compartment for visibility. The lateral meniscus was found to be intact but torn away at the mid body capsular attachment and very amendable to repair. Prolene sutures were passed in vertical mattress technique through the retinaculum and the edge of the lateral meniscus.  Joint was irrigated thoroughly and the joint surface inspected.  This revealed significant depression of the lateral compartment with fracture lines extending through the medial compartment but no significant displacement there. I then went to the metaphysis making a trap door using the curved 1/2-inch osteotome with a posteriorly based hinge.  This was opened up and the bone tamp used in sequential fashion supplemented with both fluoroscopy and direct visualization to get the entirety of the joint elevated to the appropriate height.  This was secured initially with multiple K wires and then the Biomet ALPS plate applied laterally, securing a standard distal screw and the king tong clamp proximally for compression. Subsequent fixation with standard screws proximally and locked ones proximally. Final AP and lateral views showed outstanding reduction and overall knee alignment.  It should be noted that prior to closing the trap door 40 cc's of cancellous chips were impacted into this area below the subchondral bone, which had been elevated into a reduced position and then the trap door was closed before putting down the lateral plate.  Lastly, the long Metzenbaum scissors were used to spread superficial and deep to the anterior compartment fascia and then the scissors were passed 10 cm along the fascia to perform an anterior compartment release to reduce the risk of postoperative compartment syndrome or other complications.  All wounds were irrigated thoroughly and then closed in standard layered  fashion using 0 Prolene vertical mattress for coronary multi-ligament repair, #1 Vicryl for the retinaculum, 2-0 Vicryl and 2-0 nylon for the  subcutaneous and skin.  Sterile gently compressive dressing was applied and a knee immobilizer.  The patient was awakened from anesthesia and transported to the PACU in stable condition.  Ainsley Spinner, PA-C, was present and assisting throughout.  Assistant was absolutely necessary to control the leg for inspection, treatment of a meniscal tear, as well as reduction and provisional definitive fixation of the plateau.  He also assisted with the compartment fasciotomy and closure.  PROGNOSIS:  The patient will have unrestricted range of motion, but will be strictly nonweightbearing for the next 6 weeks with graduated weightbearing thereafter.  DVT treatment will resume with Xarelto. Patient is to ice, elevate, and mobilize frequently, and contact us immediately with any concerns. D/c tomorrow. Return to the office in 2 weeks for removal of sutures.

## 2021-07-10 NOTE — Transfer of Care (Signed)
Immediate Anesthesia Transfer of Care Note  Patient: [name]  Procedure(s) Performed: OPEN REDUCTION INTERNAL FIXATION (ORIF) TIBIAL PLATEAU FRACTURE (Right: Leg Lower)  Patient Location: PACU  Anesthesia Type:General  Level of Consciousness: drowsy and patient cooperative  Airway & Oxygen Therapy: Patient Spontanous Breathing and Patient connected to nasal cannula oxygen  Post-op Assessment: Report given to RN, Post -op Vital signs reviewed and stable and Patient moving all extremities  Post vital signs: Reviewed and stable  Last Vitals:  Vitals Value Taken Time  BP 185/104 07/10/21 1022  Temp    Pulse 90 07/10/21 1024  Resp 19 07/10/21 1024  SpO2 97 % 07/10/21 1024  Vitals shown include unvalidated device data.  Last Pain:  Vitals:   07/10/21 0556  TempSrc: Oral         Complications: No notable events documented.

## 2021-07-10 NOTE — Progress Notes (Signed)
Orthopedic Tech Progress Note Patient Details:  [name] December 02, 1965 [id number] Called in order to [facility] for ROM Knee brace Patient ID: [name], female   DOB: [birth year], 56 y.o.   MRN: [id number]  [name] 07/10/2021, 10:48 AM

## 2021-07-10 NOTE — Progress Notes (Signed)
Received pt from PACU with high BP, between 503'U-882'C systolic. PA was informed via call and was ok with it and will give the betablocker later this 5pm. CBG was also high and instructed to give insulin this dinner.

## 2021-07-11 ENCOUNTER — Encounter (HOSPITAL_COMMUNITY): Payer: Self-pay | Admitting: Orthopedic Surgery

## 2021-07-11 DIAGNOSIS — I82401 Acute embolism and thrombosis of unspecified deep veins of right lower extremity: Secondary | ICD-10-CM | POA: Diagnosis not present

## 2021-07-11 DIAGNOSIS — S82141A Displaced bicondylar fracture of right tibia, initial encounter for closed fracture: Secondary | ICD-10-CM | POA: Diagnosis not present

## 2021-07-11 DIAGNOSIS — Z86718 Personal history of other venous thrombosis and embolism: Secondary | ICD-10-CM | POA: Diagnosis present

## 2021-07-11 LAB — GLUCOSE, CAPILLARY
Glucose-Capillary: 131 mg/dL — ABNORMAL HIGH (ref 70–99)
Glucose-Capillary: 230 mg/dL — ABNORMAL HIGH (ref 70–99)
Glucose-Capillary: 142 mg/dL — ABNORMAL HIGH (ref 70–99)

## 2021-07-11 LAB — CBC
HCT: 34.3 % — ABNORMAL LOW (ref 36.0–46.0)
Hemoglobin: 11.1 g/dL — ABNORMAL LOW (ref 12.0–15.0)
MCH: 29.1 pg (ref 26.0–34.0)
MCHC: 32.4 g/dL (ref 30.0–36.0)
MCV: 90 fL (ref 80.0–100.0)
Platelets: 366 10*3/uL (ref 150–400)
RBC: 3.81 MIL/uL — ABNORMAL LOW (ref 3.87–5.11)
RDW: 14.6 % (ref 11.5–15.5)
WBC: 14.2 10*3/uL — ABNORMAL HIGH (ref 4.0–10.5)
nRBC: 0 % (ref 0.0–0.2)

## 2021-07-11 MED ORDER — ACETAMINOPHEN 500 MG PO TABS: 500.0000 mg | ORAL_TABLET | Freq: Two times a day (BID) | ORAL | 0 refills | Status: DC

## 2021-07-11 MED ORDER — DOCUSATE SODIUM 100 MG PO CAPS: 100.0000 mg | ORAL_CAPSULE | Freq: Two times a day (BID) | ORAL | 0 refills | Status: DC

## 2021-07-11 MED ORDER — METHOCARBAMOL 500 MG PO TABS: 500.0000 mg | ORAL_TABLET | Freq: Three times a day (TID) | ORAL | 0 refills | Status: DC | PRN

## 2021-07-11 NOTE — TOC Initial Note (Addendum)
Transition of Care [facility]) - Initial/Assessment Note    Patient Details  Name: [name] MRN: [id number] Date of Birth: August 02, 1965  Transition of Care [facility]) CM/SW Contact:    [name] Mons, RN Phone Number: 07/11/2021, 12:58 PM  Clinical Narrative:             S/P ORIF, R tibia fx     From home with parents and adult daughter.  Pt states prior to injury independent with ADL's , no DME usage. Shared PT's  recommendation for HHPT. Pt agreeable to home health services. Pt without provider preference. Referral made with [name] and accepted pending MD's order. Referral made with [name] for DME, RW and BSC. Equipment will be delivered to bedside prior to discharge. Pt states family to assist with care once d/c.  TOC team will continue to assist and monnitor needs....   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 Policy # [id number]. NCM spoke with a [name] @ [name] ( office#) [phone]. [name] stated she will call pt to get more information in order to creat claim#. [name] states ADJUSTER INFORMATION WILL NOT BE AVAILABLE UNTIL MONDAY.  PA made aware , states pt will not need home health services.    Expected Discharge Plan: [name] Barriers to Discharge: Continued Medical Work up   Patient Goals and CMS Choice     Choice offered to / list presented to : Patient  Expected Discharge Plan and Services Expected Discharge Plan: [name]   Discharge Planning Services: CM Consult     Expected Discharge Date: 07/12/21               DME Arranged: 3-N-1, Walker rolling DME Agency: [name] Date DME Agency Contacted: 07/11/21 Time DME Agency Contacted: 1017 Representative spoke with at  DME Agency: [name]: [place] Date [place]: 07/11/21 Time [place]: 89 Representative spoke with at [place]: [name]  Prior Living Arrangements/Services   Lives with:: Parents, Adult Children Patient language and need for interpreter reviewed:: Yes Do you feel safe going back to the place where you live?: Yes      Need for Family Participation in Patient Care: Yes (Comment) Care giver support system in place?: Yes (comment)   Criminal Activity/Legal Involvement Pertinent to Current Situation/Hospitalization: No - Comment as needed  Activities of Daily Living Home Assistive Devices/Equipment: None ADL Screening (condition at time of admission) Patient's cognitive ability adequate to safely complete daily activities?: Yes Is the patient deaf or have difficulty hearing?: No Does the patient have difficulty seeing, even when wearing glasses/contacts?: No Does the patient have difficulty concentrating, remembering, or making decisions?: No Patient able to express need for assistance with ADLs?: Yes Does the patient have difficulty dressing or bathing?: No Independently performs ADLs?: Yes (appropriate for developmental age) Does the patient have difficulty walking or climbing stairs?: Yes Weakness of Legs: Right Weakness of Arms/Hands: None  Permission Sought/Granted   Permission granted to share information with : Yes, Verbal Permission Granted  Share Information with NAME: [name]  Mother  [phone]           Emotional Assessment Appearance:: Appears stated age Attitude/Demeanor/Rapport: Gracious Affect (typically observed): Accepting Orientation: : Oriented to Self, Oriented  to Place, Oriented to  Time, Oriented to Situation Alcohol / Substance Use: Not Applicable Psych Involvement: No (comment)  Admission diagnosis:  Closed fracture of right tibial plateau [S82.141A] Patient Active Problem List    Diagnosis Date Noted   Right leg DVT ([place]) 07/11/2021   Closed fracture of right tibial plateau 07/10/2021   Elevated LFTs 05/22/2021   Hordeolum 05/22/2021   Sleep apnea 05/14/2021   Class 1 obesity due to excess calories with body mass index (BMI) of 32.0 to 32.9 in adult 07/10/2020   ETD (eustachian tube dysfunction) 10/09/2019   Hyperlipidemia associated with type 2 diabetes mellitus ([place]) 07/10/2019   Cystoid macular edema of both eyes 06/16/2019   Vitamin D insufficiency 06/16/2019   Type 2 diabetes mellitus with hyperglycemia ([place]) 06/06/2019   Exposure to COVID-19 virus 05/17/2019   Peripheral edema 10/30/2017   Moderate persistent asthma, uncomplicated 71/24/5809   Chronic rhinitis 08/10/2017   Cough 06/06/2017   Welcome to Medicare preventive visit 04/20/2017   Chronic low back pain 12/09/2015   Degeneration of lumbar or lumbosacral intervertebral disc 12/09/2015   Hypothyroid 01/24/2014   Pseudophakia of both eyes 12/15/2013   Nasal septal deviation 01/03/2013   Encounter for Medicare annual wellness exam 11/02/2012   Hydradenitis 11/02/2012   Cystoid macular degeneration of retina 05/13/2012   Macular hole 03/23/2012   Screening-pulmonary TB 03/23/2012   Routine general medical examination at a health care facility 10/02/2011   Iritis, recurrent 10/02/2011   Tachycardia 10/02/2011   ADD (attention deficit disorder) 08/20/2010   DYSHIDROTIC ECZEMA, HANDS 11/12/2009   Obstructive sleep apnea 11/19/2006   Depression with anxiety 09/29/2006   Essential hypertension 09/29/2006   Seasonal and perennial allergic rhinitis 09/29/2006   Asthma, mild intermittent, well-controlled 09/29/2006   GERD 09/29/2006   IBS 09/29/2006   ENDOMETRIOSIS 09/29/2006   FEMALE INFERTILITY 09/29/2006   Fibromyalgia 09/29/2006   INSOMNIA 09/29/2006   PCP:  [name] Greenspan, MD Pharmacy:   [facility] [place] - [name] [place]  [address] Phone: [phone] Fax: [phone]     Social Determinants of Health (SDOH) Interventions    Readmission Risk Interventions     No data to display

## 2021-07-11 NOTE — Evaluation (Signed)
Occupational Therapy Evaluation Patient Details Name: [name] MRN: [id number] DOB: 1965-07-05 Today's Date: 07/11/2021   History of Present Illness 56 y.o. female.who sustained ground level fall resulting in right tibial plateau fracture on 6/28. After DC home, pt came back to ED on 6/30 due to increasing pain/swelling of RLE, found to have DVT. After medical mgmt, pt returned to hospital to undergo ORIF of R tibia on 7/6. PMH: ADHD, DDD, DM2, fibromyalgia, GERD, HTN, macular hole of L eye, sleep apnea   Clinical Impression   PTA, pt lives with family, typically Independent in all daily tasks. Since initial injury, pt has been managing ADLs/mobility while NWB with some difficulty. Pt presents now with post op pain but able to mobilize to/from bathroom using RW with good adherence to NWB R LE. Pt Modified Independent with UB ADLs and Min A for LB ADLs. Educated pt on tub transfer techniques when cleared for showering, use of BSC over toilet to ease transfer abilities, and recommendations for RW for improved stability rather than currently used Rollator. Feel pt will do well without need for OT follow up at DC.      Recommendations for follow up therapy are one component of a multi-disciplinary discharge planning process, led by the attending physician.  Recommendations may be updated based on patient status, additional functional criteria and insurance authorization.   Follow Up Recommendations  No OT follow up    Assistance Recommended at Discharge Intermittent Supervision/Assistance  Patient can return home with the following A little help with bathing/dressing/bathroom;Assistance with cooking/housework;Help with stairs or ramp for entrance;Assist for transportation    Functional Status Assessment  Patient has had a recent decline in their functional status and demonstrates the ability to make significant improvements in function in a reasonable and predictable amount of time.   Equipment Recommendations  BSC/3in1;Other (comment) (RW)    Recommendations for Other Services       Precautions / Restrictions Precautions Precautions: Fall Precaution Comments: unlocked hinge brace Required Braces or Orthoses: Other Brace Restrictions Weight Bearing Restrictions: Yes RLE Weight Bearing: Non weight bearing Other Position/Activity Restrictions: unrestricted ROM      Mobility Bed Mobility               General bed mobility comments: up in chair on entry    Transfers Overall transfer level: Needs assistance Equipment used: Rolling walker (2 wheels) Transfers: Sit to/from Stand Sit to Stand: Min guard           General transfer comment: increased time to rise      Balance Overall balance assessment: Needs assistance Sitting-balance support: No upper extremity supported Sitting balance-Leahy Scale: Good     Standing balance support: Bilateral upper extremity supported, Reliant on assistive device for balance Standing balance-Leahy Scale: Poor Standing balance comment: reliant on RW to maintain NWB during mobility                           ADL either performed or assessed with clinical judgement   ADL Overall ADL's : Needs assistance/impaired Eating/Feeding: Independent   Grooming: Supervision/safety;Standing   Upper Body Bathing: Modified independent;Sitting   Lower Body Bathing: Sitting/lateral leans;Sit to/from stand;Min guard   Upper Body Dressing : Modified independent;Sitting   Lower Body Dressing: Minimal assistance;Sit to/from stand;Sitting/lateral leans Lower Body Dressing Details (indicate cue type and reason): difficulty reaching down to lower strap of hinged brace Toilet Transfer: Min guard;Ambulation;BSC/3in1;Rolling walker (2 wheels);Regular Glass blower/designer  Details (indicate cue type and reason): BSC over toilet with increased ability to push from armrests to come to standing. Toileting- Clothing  Manipulation and Hygiene: Min guard;Sitting/lateral lean;Sit to/from Sales promotion account executive Details (indicate cue type and reason): able to perform hygiene seated on toilet with increased time and cues for problem solving an easier reach. min guard in standing for clothing mgmt     Functional mobility during ADLs: Min guard;Rolling walker (2 wheels) General ADL Comments: good adherence to NWB prec though limited by pain and endurance deficits for household distance mobility using RW. EDucated on use of RW and BSC at home - rec this DME     Vision Ability to See in Adequate Light: 0 Adequate Patient Visual Report: No change from baseline Vision Assessment?: No apparent visual deficits     Perception     Praxis      Pertinent Vitals/Pain Pain Assessment Pain Assessment: Faces Faces Pain Scale: Hurts even more Pain Location: R LE Pain Descriptors / Indicators: Discomfort, Grimacing, Guarding Pain Intervention(s): Monitored during session     Hand Dominance Right   Extremity/Trunk Assessment Upper Extremity Assessment Upper Extremity Assessment: Overall WFL for tasks assessed   Lower Extremity Assessment Lower Extremity Assessment: Defer to PT evaluation RLE Deficits / Details: s/p ORIF R tibia; unable to perform effective quad set 1/5 RLE: Unable to fully assess due to pain   Cervical / Trunk Assessment Cervical / Trunk Assessment: Normal   Communication Communication Communication: No difficulties   Cognition Arousal/Alertness: Awake/alert Behavior During Therapy: WFL for tasks assessed/performed Overall Cognitive Status: Within Functional Limits for tasks assessed                                       General Comments  Mom present and supportive    Exercises     Shoulder Instructions      Home Living Family/patient expects to be discharged to:: Private residence Living Arrangements: Parent Available Help at Discharge:  Family Type of Home: Apartment Home Access: Stairs to enter Technical brewer of Steps: flight Entrance Stairs-Rails: Right;Can reach both;Left Home Layout: One level               Home Equipment: Rollator (4 wheels)          Prior Functioning/Environment Prior Level of Function : Independent/Modified Independent             Mobility Comments: for past week has been using rollator to sit and roll self around the house. Has been bumping up backwards on buttocks to negotiate stairs ADLs Comments: typically independent with ADLs, has been sponge bathing by sitting on edge of tub since injury, washing hair in sink, etc        OT Problem List: Decreased strength;Decreased activity tolerance;Impaired balance (sitting and/or standing);Decreased knowledge of use of DME or AE      OT Treatment/Interventions: Self-care/ADL training;Therapeutic exercise;Energy conservation;DME and/or AE instruction;Therapeutic activities;Patient/family education;Balance training    OT Goals(Current goals can be found in the care plan section) Acute Rehab OT Goals Patient Stated Goal: full recovery OT Goal Formulation: With patient Time For Goal Achievement: 07/25/21 Potential to Achieve Goals: Good  OT Frequency: Min 2X/week    Co-evaluation              AM-PAC OT "6 Clicks" Daily Activity     Outcome Measure Help from another person eating meals?:  None Help from another person taking care of personal grooming?: A Little Help from another person toileting, which includes using toliet, bedpan, or urinal?: A Little Help from another person bathing (including washing, rinsing, drying)?: A Little Help from another person to put on and taking off regular upper body clothing?: A Little Help from another person to put on and taking off regular lower body clothing?: A Little 6 Click Score: 19   End of Session Equipment Utilized During Treatment: Rolling walker (2 wheels)  Activity  Tolerance: Patient tolerated treatment well Patient left: in chair;with call bell/phone within reach  OT Visit Diagnosis: Unsteadiness on feet (R26.81);Other abnormalities of gait and mobility (R26.89);Pain Pain - Right/Left: Right Pain - part of body: Knee;Leg                Time: 2706-2376 OT Time Calculation (min): 22 min Charges:  OT General Charges $OT Visit: 1 Visit OT Evaluation $OT Eval Low Complexity: 1 Low  Malachy Chamber, OTR/L Acute Rehab Services Office: [phone]   Layla Maw 07/11/2021, 2:13 PM

## 2021-07-11 NOTE — Plan of Care (Signed)
  Problem: Skin Integrity: Goal: Risk for impaired skin integrity will decrease Outcome: Progressing   Problem: Education: Goal: Knowledge of General Education information will improve Description: Including pain rating scale, medication(s)/side effects and non-pharmacologic comfort measures Outcome: Progressing   Problem: Clinical Measurements: Goal: Will remain free from infection Outcome: Progressing

## 2021-07-11 NOTE — Evaluation (Signed)
Physical Therapy Evaluation Patient Details Name: [name] MRN: [id number] DOB: Jul 31, 1965 Today's Date: 07/11/2021  History of Present Illness  56 y.o. female.who sustained ground level fall resulting in right tibial plateau fracture on 6/28. After DC home, pt came back to ED on 6/30 due to increasing pain/swelling of RLE, found to have DVT. After medical mgmt, pt returned to hospital to undergo ORIF of R tibia on 7/6. PMH: ADHD, DDD, DM2, fibromyalgia, GERD, HTN, macular hole of L eye, sleep apnea  Clinical Impression  Patient admitted following above procedure. In the past week, patient has been NWB and able to navigate house while sitting on rollator as well as bumping up backwards up flight of stairs to access 2nd floor apartment where she lives with her mother. Patient currently presents with impaired ROM, weakness, impaired balance, and impaired functional mobility. Patient overall mobilizing well while maintaining NWB on R. Educated patient on LE positioning, quad sets, heel slides, and placing chair at top of stairs to ease transition from ground to standing, patient and mother verbalized understanding. Will practice next session. Patient will benefit from skilled PT services during acute stay to address listed deficits. Recommend HHPT at discharge to work on LE strengthening and ROM as well as general mobility around the home.        Recommendations for follow up therapy are one component of a multi-disciplinary discharge planning process, led by the attending physician.  Recommendations may be updated based on patient status, additional functional criteria and insurance authorization.  Follow Up Recommendations Home health PT      Assistance Recommended at Discharge Intermittent Supervision/Assistance  Patient can return home with the following  A little help with walking and/or transfers;Assistance with cooking/housework;Help with stairs or ramp for entrance;Assist for  transportation    Equipment Recommendations Rolling [name] (2 wheels)  Recommendations for Other Services       Functional Status Assessment Patient has had a recent decline in their functional status and demonstrates the ability to make significant improvements in function in a reasonable and predictable amount of time.     Precautions / Restrictions Precautions Precautions: Fall Precaution Comments: unlocked hinge brace Required Braces or Orthoses: Other Brace Restrictions Weight Bearing Restrictions: Yes RLE Weight Bearing: Non weight bearing Other Position/Activity Restrictions: unrestricted ROM      Mobility  Bed Mobility Overal bed mobility: Needs Assistance Bed Mobility: Supine to Sit     Supine to sit: Supervision     General bed mobility comments: increased time to complete but able to utilize UEs to advance R LE towards EOB    Transfers Overall transfer level: Needs assistance Equipment used: Rolling [name] (2 wheels) Transfers: Sit to/from Stand Sit to Stand: Min guard           General transfer comment: increased time and effort to complete. Min guard for safety. Cues for safe hand placement    Ambulation/Gait Ambulation/Gait assistance: Supervision Gait Distance (Feet): 8 Feet Assistive device: Rolling [name] (2 wheels) Gait Pattern/deviations:  (hop to) Gait velocity: decreased     General Gait Details: able to maintain NWB on R adequately during short mobility. Reporting dizziness which limited gait progression.  Stairs Stairs:  (Discussed with patient about placing chair on top of flight of stairs to ease transition from ground to standing while maintaining NWB on R, patient verbalized understanding. Will practice next session)          Wheelchair Mobility    Modified Rankin (Stroke Patients Only)  Balance Overall balance assessment: Needs assistance Sitting-balance support: No upper extremity supported Sitting  balance-Leahy Scale: Good     Standing balance support: Bilateral upper extremity supported, Reliant on assistive device for balance Standing balance-Leahy Scale: Poor Standing balance comment: reliant on RW to maintain NWB during mobility                             Pertinent Vitals/Pain Pain Assessment Pain Assessment: 0-10 Pain Score: 7  Pain Location: R LE Pain Descriptors / Indicators: Discomfort, Grimacing, Guarding Pain Intervention(s): Monitored during session, Repositioned    Home Living Family/patient expects to be discharged to:: Private residence Living Arrangements: Parent Available Help at Discharge: Family Type of Home: Apartment Home Access: Stairs to enter Entrance Stairs-Rails: Right;Can reach Software engineer of Steps: flight   Home Layout: One level Home Equipment: Rollator (4 wheels)      Prior Function Prior Level of Function : Independent/Modified Independent             Mobility Comments: for past week has been using rollator to sit and roll self around the house. Has been bumping up backwards on buttocks to negotiate stairs       Hand Dominance        Extremity/Trunk Assessment   Upper Extremity Assessment Upper Extremity Assessment: Defer to OT evaluation    Lower Extremity Assessment Lower Extremity Assessment: RLE deficits/detail RLE Deficits / Details: s/p ORIF R tibia; unable to perform effective quad set 1/5 RLE: Unable to fully assess due to pain    Cervical / Trunk Assessment Cervical / Trunk Assessment: Normal  Communication   Communication: No difficulties  Cognition Arousal/Alertness: Awake/alert Behavior During Therapy: WFL for tasks assessed/performed Overall Cognitive Status: Within Functional Limits for tasks assessed                                          General Comments General comments (skin integrity, edema, etc.): Mom present and supportive    Exercises  Other Exercises Other Exercises: instructed on quad sets and heel slides as well as LE positioning and pillow placement   Assessment/Plan    PT Assessment Patient needs continued PT services  PT Problem List Decreased strength;Decreased range of motion;Decreased activity tolerance;Decreased balance;Decreased mobility       PT Treatment Interventions Gait training;DME instruction;Stair training;Functional mobility training;Therapeutic activities;Therapeutic exercise;Balance training;Patient/family education    PT Goals (Current goals can be found in the Care Plan section)  Acute Rehab PT Goals Patient Stated Goal: to reduce pain PT Goal Formulation: With patient Time For Goal Achievement: 07/25/21 Potential to Achieve Goals: Good    Frequency Min 5X/week     Co-evaluation               AM-PAC PT "6 Clicks" Mobility  Outcome Measure Help needed turning from your back to your side while in a flat bed without using bedrails?: A Little Help needed moving from lying on your back to sitting on the side of a flat bed without using bedrails?: A Little Help needed moving to and from a bed to a chair (including a wheelchair)?: A Little Help needed standing up from a chair using your arms (e.g., wheelchair or bedside chair)?: A Little Help needed to walk in hospital room?: A Little Help needed climbing 3-5 steps with a railing? : A  Little 6 Click Score: 18    End of Session Equipment Utilized During Treatment: Other (comment) (R hinge brace) Activity Tolerance: Patient tolerated treatment well Patient left: in chair;with call bell/phone within reach;with family/visitor present Nurse Communication: Mobility status PT Visit Diagnosis: Muscle weakness (generalized) (M62.81);Unsteadiness on feet (R26.81);Other abnormalities of gait and mobility (R26.89)    Time: 9794-8016 PT Time Calculation (min) (ACUTE ONLY): 41 min   Charges:   PT Evaluation $PT Eval Low Complexity: 1 Low PT  Treatments $Therapeutic Exercise: 8-22 mins $Therapeutic Activity: 8-22 mins        Devon Kingdon A. Gilford Rile PT, DPT Acute Rehabilitation Services Office [phone]   Linna Hoff 07/11/2021, 10:35 AM

## 2021-07-11 NOTE — Progress Notes (Signed)
Orthopaedic Trauma Service Progress Note  Patient ID: [name] MRN: [id number] DOB/AGE: Mar 06, 1965 56 y.o.  Subjective:  Doing ok  Pretty severe pain over night Pain meds do help  Severe leg muscle spasms   No CP, no SOB No n/v No h/a No dizziness   Lives on 2nd floor   + DVT diagnosed pre-op, on treatment dose xarelto   ROS As above  Objective:   VITALS:   Vitals:   07/10/21 2004 07/10/21 2120 07/10/21 2351 07/11/21 0748  BP: (!) 151/84  (!) 133/91 (!) 147/82  Pulse: (!) 106  84 77  Resp: 17  18   Temp: 98.4 F (36.9 C)  98.6 F (37 C) 98.6 F (37 C)  TempSrc: Oral  Oral Oral  SpO2: 97% 98% 98% 96%  Weight:      Height:        Estimated body mass index is 31.38 kg/m as calculated from the following:   Height as of this encounter: '5\' 8"'$  (1.727 m).   Weight as of this encounter: 93.6 kg.   Intake/Output      07/06 0701 07/07 0700 07/07 0701 07/08 0700   P.O. 600    I.V. (mL/kg) 1296 (13.8)    IV Piggyback 300    Total Intake(mL/kg) 2196 (23.5)    Urine (mL/kg/hr) 2900 (1.3)    Blood 50    Total Output 2950    Net -754           LABS  Results for orders placed or performed during the hospital encounter of 07/10/21 (from the past 24 hour(s))  Glucose, capillary     Status: Abnormal   Collection Time: 07/10/21 10:25 AM  Result Value Ref Range   Glucose-Capillary 160 (H) 70 - 99 mg/dL  Glucose, capillary     Status: Abnormal   Collection Time: 07/10/21 12:56 PM  Result Value Ref Range   Glucose-Capillary 183 (H) 70 - 99 mg/dL  Hemoglobin A1c     Status: Abnormal   Collection Time: 07/10/21  1:27 PM  Result Value Ref Range   Hgb A1c MFr Bld 7.3 (H) 4.8 - 5.6 %   Mean Plasma Glucose 162.81 mg/dL  Glucose, capillary     Status: Abnormal   Collection Time: 07/10/21  5:06 PM  Result Value Ref Range   Glucose-Capillary 208 (H) 70 - 99 mg/dL  CBC     Status:  Abnormal   Collection Time: 07/11/21  1:50 AM  Result Value Ref Range   WBC 14.2 (H) 4.0 - 10.5 K/uL   RBC 3.81 (L) 3.87 - 5.11 MIL/uL   Hemoglobin 11.1 (L) 12.0 - 15.0 g/dL   HCT 34.3 (L) 36.0 - 46.0 %   MCV 90.0 80.0 - 100.0 fL   MCH 29.1 26.0 - 34.0 pg   MCHC 32.4 30.0 - 36.0 g/dL   RDW 14.6 11.5 - 15.5 %   Platelets 366 150 - 400 K/uL   nRBC 0.0 0.0 - 0.2 %  Glucose, capillary     Status: Abnormal   Collection Time: 07/11/21  7:50 AM  Result Value Ref Range   Glucose-Capillary 131 (H) 70 - 99 mg/dL   Comment 1 Document in Chart      PHYSICAL EXAM:   Gen: awake, alert, sitting up in bed, NAD,  about to work with therapy  Lungs: unlabored Cardiac: regular Ext:       Right Lower extremity   Dressing clean, dry and intact  Hinged brace fitting well   Unlocked    Set to hyperextension   Ext warm   Mild swelling   DPN, SPN, TN sensory functions intact  EHL, FHL, lesser toe motor function intact  Ankle flexion, extension, inversion eversion intact  Knee is resting a little bit of flexion  Assessment/Plan: 1 Day Post-Op   Principal Problem:   Closed fracture of right tibial plateau Active Problems:   Depression with anxiety   Essential hypertension   Asthma, mild intermittent, well-controlled   ADD (attention deficit disorder)   Type 2 diabetes mellitus with hyperglycemia (HCC)   Right leg DVT ([place])   Anti-infectives (From admission, onward)    Start     Dose/Rate Route Frequency Ordered Stop   07/10/21 1400  ceFAZolin (ANCEF) IVPB 2g/100 mL premix        2 g 200 mL/hr over 30 Minutes Intravenous Every 6 hours 07/10/21 1241 07/11/21 0222   07/10/21 0600  ceFAZolin (ANCEF) IVPB 2g/100 mL premix        2 g 200 mL/hr over 30 Minutes Intravenous On call to O.R. 07/10/21 5427 07/10/21 0813     .  POD/HD#: 25  56 year old female closed right lateral tibial plateau fracture  -Closed right lateral tibial plateau fracture s/p ORIF ([facility] 2)  NWB R leg x 6  weeks   Mobilize with assistance  Unrestricted ROM R knee  Hinged knee brace   Unlocked for full ROM   Bone foam when in bed to maintain full extension   PT/OT evals  Daily dressing changes starting on 07/13/2021    PT- please teach HEP for right knee ROM- AROM, PROM. Prone exercises as well. No ROM restrictions.  Quad sets, SLR, LAQ, SAQ, heel slides, stretching, prone flexion and extension  Ankle theraband program, heel cord stretching, toe towel curls, etc  No pillows under bend of knee when at rest, ok to place under heel to help work on extension. Can also use zero knee bone foam if available  Hinged knee brace on at all times, unlocked.  Ok to work on [employer] without brace with therapist supervision.  Brace back on after ROM session if removed    - Pain management:  Multimodal: Tylenol, Percocet, Robaxin  - ABL anemia/Hemodynamics  Stable  BPs improved  - Medical issues   Home meds   - DVT/PE prophylaxis:  On treatment dose xarelto  - ID:   Periop abx  - Metabolic Bone Disease:  Vitamin d levels in insufficient range    Continue supplementation - Activity:  As above  - FEN/GI prophylaxis/Foley/Lines:  Carb mod diet   - Impediments to fracture healing:  Diabetes  Vitamin d insufficiency   - Dispo:  Therapy evals   Home tomorrow   [facility] consult for DME needs   Weightbearing: NWB RLE ROM: unrestricted ROM R knee\  No pillows under knee when at rest to prevent contracture   Insicional and dressing care: Daily dressing changes with mepitel/adaptic, 4x4 gauze and tape or compression sock starting on 07/13/2021 Pain management:  multimodal: tylenol, percocet, robaxin Bone Health/Optimization: continue vitamin d supplementation  Orthopedic device(s):  hinged brace, walker Showering: ok to shower and clean wounds with soap and water once incision is dry  VTE prophylaxis: on treatment dose xarelto for acute R leg dvt   Follow -  up plan: 2 weeks   Jari Pigg,  PA-C [phone] (C) 07/11/2021, 9:11 AM  [facility] [place] [address] [phone] Jenetta [phone] (F)    After 5pm and on the weekends please log on to Amion, go to orthopaedics and the look under the [facility] Call for the provider(s) on call. You can also call our office at [phone] and then follow the prompts to be connected to the call team.   Patient ID: [name], female   DOB: 06/13/1965, 56 y.o.   MRN: [id number]

## 2021-07-11 NOTE — Discharge Instructions (Signed)
Orthopaedic Trauma Service Discharge Instructions   General Discharge Instructions  Orthopaedic Injuries:  Right tibial plateau fracture treated with open reduction internal fixation using plate and screws  WEIGHT BEARING STATUS: Nonweightbearing right leg, use walker for ambulation.  RANGE OF MOTION/ACTIVITY: Unrestricted range of motion right knee.  Do not rest with the knee in flexion.  Use bone foam to keep knee fully extended when at rest.  This will help prevent flexion contracture.  Activity as tolerated while maintaining weightbearing restrictions.  Continue to do home exercises that therapy taught you  Bone health: Labs show vitamin D insufficiency continue to take vitamin D supplementation  Review the following resource for additional information regarding bone health  [email or web address]  Wound Care: Daily wound care starting on 07/13/2021.  Please see below Discharge Wound Care Instructions  Do NOT apply any ointments, solutions or lotions to pin sites or surgical wounds.  These prevent needed drainage and even though solutions like hydrogen peroxide kill bacteria, they also damage cells lining the pin sites that help fight infection.  Applying lotions or ointments can keep the wounds moist and can cause them to breakdown and open up as well. This can increase the risk for infection. When in doubt call the office.  Surgical incisions should be dressed daily.  If any drainage is noted, use one layer of adaptic or Mepitel, then gauze, Kerlix, and an ace wrap. You can use a compression sock instead of an ace wrap  [email or web address]  [email or web address].gl  These dressing supplies should be available at local medical supply  stores ([facility] [employer], [facility], etc). They are not usually carried at places like [facility], [facility], [employer], etc  Once the incision is completely dry and without drainage, it may be left open to air out.  Showering may begin 36-48 hours later.  Cleaning gently with soap and water.   Diet: as you were eating previously.  Can use over the counter stool softeners and bowel preparations, such as Miralax, to help with bowel movements.  Narcotics can be constipating.  Be sure to drink plenty of fluids  PAIN MEDICATION USE AND EXPECTATIONS  You have likely been given narcotic medications to help control your pain.  After a traumatic event that results in an fracture (broken bone) with or without surgery, it is ok to use narcotic pain medications to help control one's pain.  We understand that everyone responds to pain differently and each individual patient will be evaluated on a regular basis for the continued need for narcotic medications. Ideally, narcotic medication use should last no more than 6-8 weeks (coinciding with fracture healing).   As a patient it is your responsibility as well to monitor narcotic medication use and report the amount and frequency you use these medications when you come to your office visit.   We would also advise that if you are using narcotic medications, you should take a dose prior to therapy to maximize you participation.  IF YOU ARE ON NARCOTIC MEDICATIONS IT IS NOT PERMISSIBLE TO OPERATE A MOTOR VEHICLE (MOTORCYCLE/CAR/TRUCK/MOPED) OR HEAVY MACHINERY DO NOT MIX NARCOTICS WITH OTHER CNS (CENTRAL NERVOUS SYSTEM) DEPRESSANTS SUCH AS ALCOHOL   POST-OPERATIVE OPIOID TAPER INSTRUCTIONS: It is important to wean off of your opioid medication as soon as possible. If you do not need pain medication after your surgery it is ok to stop day one. Opioids include: Codeine, Hydrocodone(Norco, Vicodin), Oxycodone(Percocet, oxycontin) and hydromorphone amongst others.  Long  term and even short term use of  opiods can cause: Increased pain response Dependence Constipation Depression Respiratory depression And more.  Withdrawal symptoms can include Flu like symptoms Nausea, vomiting And more Techniques to manage these symptoms Hydrate well Eat regular healthy meals Stay active Use relaxation techniques(deep breathing, meditating, yoga) Do Not substitute Alcohol to help with tapering If you have been on opioids for less than two weeks and do not have pain than it is ok to stop all together.  Plan to wean off of opioids This plan should start within one week post op of your fracture surgery  Maintain the same interval or time between taking each dose and first decrease the dose.  Cut the total daily intake of opioids by one tablet each day Next start to increase the time between doses. The last dose that should be eliminated is the evening dose.    STOP SMOKING OR USING NICOTINE PRODUCTS!!!!  As discussed nicotine severely impairs your body's ability to heal surgical and traumatic wounds but also impairs bone healing.  Wounds and bone heal by forming microscopic blood vessels (angiogenesis) and nicotine is a vasoconstrictor (essentially, shrinks blood vessels).  Therefore, if vasoconstriction occurs to these microscopic blood vessels they essentially disappear and are unable to deliver necessary nutrients to the healing tissue.  This is one modifiable factor that you can do to dramatically increase your chances of healing your injury.    (This means no smoking, no nicotine gum, patches, etc)  DO NOT USE NONSTEROIDAL ANTI-INFLAMMATORY DRUGS (NSAID'S)  Using products such as Advil (ibuprofen), Aleve (naproxen), Motrin (ibuprofen) for additional pain control during fracture healing can delay and/or prevent the healing response.  If you would like to take over the counter (OTC) medication, Tylenol (acetaminophen) is ok.  However, some narcotic medications that are  given for pain control contain acetaminophen as well. Therefore, you should not exceed more than 4000 mg of tylenol in a day if you do not have liver disease.  Also note that there are may OTC medicines, such as cold medicines and allergy medicines that my contain tylenol as well.  If you have any questions about medications and/or interactions please ask your doctor/PA or your pharmacist.      ICE AND ELEVATE INJURED/OPERATIVE EXTREMITY  Using ice and elevating the injured extremity above your heart can help with swelling and pain control.  Icing in a pulsatile fashion, such as 20 minutes on and 20 minutes off, can be followed.    Do not place ice directly on skin. Make sure there is a barrier between to skin and the ice pack.    Using frozen items such as frozen peas works well as the conform nicely to the are that needs to be iced.  USE AN ACE WRAP OR TED HOSE FOR SWELLING CONTROL  In addition to icing and elevation, Ace wraps or TED hose are used to help limit and resolve swelling.  It is recommended to use Ace wraps or TED hose until you are informed to stop.    When using Ace Wraps start the wrapping distally (farthest away from the body) and wrap proximally (closer to the body)   Example: If you had surgery on your leg or thing and you do not have a splint on, start the ace wrap at the toes and work your way up to the thigh        If you had surgery on your upper extremity and do not have a splint on, start the ace wrap at your  fingers and work your way up to the upper arm  IF YOU ARE IN A SPLINT OR CAST DO NOT REMOVE IT FOR ANY REASON   If your splint gets wet for any reason please contact the office immediately. You may shower in your splint or cast as long as you keep it dry.  This can be done by wrapping in a cast cover or garbage back (or similar)  Do Not stick any thing down your splint or cast such as pencils, money, or hangers to try and scratch yourself with.  If you feel itchy take  benadryl as prescribed on the bottle for itching  IF YOU ARE IN A CAM BOOT (BLACK BOOT)  You may remove boot periodically. Perform daily dressing changes as noted below.  Wash the liner of the boot regularly and wear a sock when wearing the boot. It is recommended that you sleep in the boot until told otherwise    Call office for the following: Temperature greater than 101F Persistent nausea and vomiting Severe uncontrolled pain Redness, tenderness, or signs of infection (pain, swelling, redness, odor or green/yellow discharge around the site) Difficulty breathing, headache or visual disturbances Hives Persistent dizziness or light-headedness Extreme fatigue Any other questions or concerns you may have after discharge  In an emergency, call 911 or go to an Emergency Department at a nearby hospital  HELPFUL INFORMATION  If you had a block, it will wear off between 8-24 hrs postop typically.  This is period when your pain may go from nearly zero to the pain you would have had postop without the block.  This is an abrupt transition but nothing dangerous is happening.  You may take an extra dose of narcotic when this happens.  You should wean off your narcotic medicines as soon as you are able.  Most patients will be off or using minimal narcotics before their first postop appointment.   We suggest you use the pain medication the first night prior to going to bed, in order to ease any pain when the anesthesia wears off. You should avoid taking pain medications on an empty stomach as it will make you nauseous.  Do not drink alcoholic beverages or take illicit drugs when taking pain medications.  In most states it is against the law to drive while you are in a splint or sling.  And certainly against the law to drive while taking narcotics.  You may return to work/school in the next couple of days when you feel up to it.   Pain medication may make you constipated.  Below are a few  solutions to try in this order: Decrease the amount of pain medication if you aren't having pain. Drink lots of decaffeinated fluids. Drink prune juice and/or each dried prunes  If the first 3 don't work start with additional solutions Take Colace - an over-the-counter stool softener Take Senokot - an over-the-counter laxative Take Miralax - a stronger over-the-counter laxative     CALL THE OFFICE WITH ANY QUESTIONS OR CONCERNS: [phone]   VISIT OUR WEBSITE FOR ADDITIONAL INFORMATION: [email or web address]

## 2021-07-12 ENCOUNTER — Ambulatory Visit (HOSPITAL_COMMUNITY): Payer: Worker's Compensation

## 2021-07-12 DIAGNOSIS — S82141A Displaced bicondylar fracture of right tibia, initial encounter for closed fracture: Secondary | ICD-10-CM | POA: Diagnosis not present

## 2021-07-12 DIAGNOSIS — M79621 Pain in right upper arm: Secondary | ICD-10-CM | POA: Diagnosis not present

## 2021-07-12 LAB — CBC
HCT: 31.4 % — ABNORMAL LOW (ref 36.0–46.0)
Hemoglobin: 10.2 g/dL — ABNORMAL LOW (ref 12.0–15.0)
MCH: 29.4 pg (ref 26.0–34.0)
MCHC: 32.5 g/dL (ref 30.0–36.0)
MCV: 90.5 fL (ref 80.0–100.0)
Platelets: 342 10*3/uL (ref 150–400)
RBC: 3.47 MIL/uL — ABNORMAL LOW (ref 3.87–5.11)
RDW: 14.6 % (ref 11.5–15.5)
WBC: 13.5 10*3/uL — ABNORMAL HIGH (ref 4.0–10.5)
nRBC: 0 % (ref 0.0–0.2)

## 2021-07-12 LAB — GLUCOSE, CAPILLARY
Glucose-Capillary: 182 mg/dL — ABNORMAL HIGH (ref 70–99)
Glucose-Capillary: 164 mg/dL — ABNORMAL HIGH (ref 70–99)

## 2021-07-12 NOTE — Progress Notes (Signed)
Physical Therapy Treatment Patient Details Name: [name] MRN: [id number] DOB: [birth year] Today's Date: 07/12/2021   History of Present Illness 56 y.o. female.who sustained ground level fall resulting in right tibial plateau fracture on 6/28. After DC home, pt came back to ED on 6/30 due to increasing pain/swelling of RLE, found to have DVT. After medical mgmt, pt returned to hospital to undergo ORIF of R tibia on 7/6. PMH: ADHD, DDD, DM2, fibromyalgia, GERD, HTN, macular hole of L eye, sleep apnea    PT Comments    Pt received OOB ambulating from BR. Pt agreeable to session with focus on safe stair negotiation for safe d/c to home. Pt able to demonstrate safe stair ascent/descent in stairwell with controlled stand to sit on bottom on step and ability to scoot up backwards with supervision and scoot back down without fault. PT demonstrating stable hot to gait on RW with good adherence to WB precautions throughout. Educated pt re; how to adjust RW to correct height after d/c (mother had taken RW out of hospital), safe car entry/exit, activity recommendations and importance of continued mobility with pt verbalizing understanding. Anticipate safe discharge with family assistance once medically cleared, will follow acutely. Pt continues to benefit from skilled PT services to progress toward functional mobility goals.      Recommendations for follow up therapy are one component of a multi-disciplinary discharge planning process, led by the attending physician.  Recommendations may be updated based on patient status, additional functional criteria and insurance authorization.  Follow Up Recommendations  Home health PT     Assistance Recommended at Discharge Intermittent Supervision/Assistance  Patient can return home with the following A little help with walking and/or transfers;Assistance with cooking/housework;Help with stairs or ramp for entrance;Assist for transportation   Equipment  Recommendations  Rolling walker (2 wheels)    Recommendations for Other Services       Precautions / Restrictions Precautions Precautions: Fall Precaution Comments: unlocked hinge brace Required Braces or Orthoses: Other Brace Restrictions Weight Bearing Restrictions: Yes RLE Weight Bearing: Non weight bearing Other Position/Activity Restrictions: unrestricted ROM     Mobility  Bed Mobility Overal bed mobility: Needs Assistance             General bed mobility comments: OOB in recliner on arrival    Transfers Overall transfer level: Needs assistance Equipment used: Rolling walker (2 wheels) Transfers: Sit to/from Stand Sit to Stand: Min guard           General transfer comment: increased time to rise    Ambulation/Gait Ambulation/Gait assistance: Supervision Gait Distance (Feet): 15 Feet Assistive device: Rolling walker (2 wheels) Gait Pattern/deviations:  (hop to) Gait velocity: decreased     General Gait Details: able to maintain NWB on R adequately during short mobility for focus on stair negotiation.   Stairs Stairs: Yes Stairs assistance: Min guard Stair Management: No rails, Backwards (on buttocks) Number of Stairs: 5 General stair comments: up and down steps in stairwell on buttocks withotut fault, however pt fatiguing quickly, encouraged adequate rest breaks and supervision from family on return to home,   Wheelchair Mobility    Modified Rankin (Stroke Patients Only)       Balance Overall balance assessment: Needs assistance Sitting-balance support: No upper extremity supported Sitting balance-Leahy Scale: Good     Standing balance support: Bilateral upper extremity supported, Reliant on assistive device for balance Standing balance-Leahy Scale: Poor Standing balance comment: reliant on RW to maintain NWB during mobility  Cognition Arousal/Alertness: Awake/alert Behavior During Therapy: WFL  for tasks assessed/performed Overall Cognitive Status: Within Functional Limits for tasks assessed                                          Exercises General Exercises - Lower Extremity Ankle Circles/Pumps: AROM, Right, Left, 10 reps, Supine Quad Sets: Strengthening, Right, 10 reps, Supine Heel Slides: AAROM, Right, 10 reps, Supine    General Comments General comments (skin integrity, edema, etc.): Mom present and supportive, continued education on positioing and pillow placement to favor extension.      Pertinent Vitals/Pain Pain Assessment Pain Assessment: Faces Faces Pain Scale: Hurts little more Pain Location: R LE Pain Descriptors / Indicators: Discomfort, Grimacing, Guarding Pain Intervention(s): Monitored during session, Limited activity within patient's tolerance    Home Living                          Prior Function            PT Goals (current goals can now be found in the care plan section) Acute Rehab PT Goals Patient Stated Goal: to reduce pain PT Goal Formulation: With patient Time For Goal Achievement: 07/25/21    Frequency    Min 5X/week      PT Plan      Co-evaluation              AM-PAC PT "6 Clicks" Mobility   Outcome Measure  Help needed turning from your back to your side while in a flat bed without using bedrails?: A Little Help needed moving from lying on your back to sitting on the side of a flat bed without using bedrails?: A Little Help needed moving to and from a bed to a chair (including a wheelchair)?: A Little Help needed standing up from a chair using your arms (e.g., wheelchair or bedside chair)?: A Little Help needed to walk in hospital room?: A Little Help needed climbing 3-5 steps with a railing? : A Little 6 Click Score: 18    End of Session Equipment Utilized During Treatment: Other (comment) (R hinge brace) Activity Tolerance: Patient tolerated treatment well Patient left: in  chair;with call bell/phone within reach;with family/visitor present Nurse Communication: Mobility status PT Visit Diagnosis: Muscle weakness (generalized) (M62.81);Unsteadiness on feet (R26.81);Other abnormalities of gait and mobility (R26.89)     Time: 8295-6213 PT Time Calculation (min) (ACUTE ONLY): 22 min  Charges:  $Gait Training: 8-22 mins                     Adyson Vanburen R. PTA Acute Rehabilitation Services Office: [place] 07/12/2021, 10:04 AM

## 2021-07-12 NOTE — Progress Notes (Signed)
Patient discharge teaching given, including activity, diet, follow-up appoints, and medications. Patient verbalized understanding of all discharge instructions. IV access was d/c'd. Vitals are stable. Skin is intact except as charted in most recent assessments. Pt to be escorted out by NT, to be driven home by family. 

## 2021-07-12 NOTE — Discharge Summary (Signed)
Discharge Summary  Patient ID: [name] MRN: [id number] DOB/AGE: 07-04-1965 56 y.o.  Admit date: 07/10/2021 Discharge date: 07/12/2021  Admission Diagnoses:  Closed fracture of right tibial plateau  Discharge Diagnoses:  Principal Problem:   Closed fracture of right tibial plateau Active Problems:   Depression with anxiety   Essential hypertension   Asthma, mild intermittent, well-controlled   ADD (attention deficit disorder)   Type 2 diabetes mellitus with hyperglycemia (HCC)   Right leg DVT (HCC)   Past Medical History:  Diagnosis Date   ADHD (attention deficit hyperactivity disorder)    ADD - no meds   Allergy    allergic rhinitis   Anxiety    Asthma    Chronic headaches    Degenerative disc disease    Depression    Diabetes mellitus without complication ([place])    type 2   Eczema    no current problems as of 07/09/21   Fatty liver    Fibromyalgia    Fx of fibula 2009   GERD (gastroesophageal reflux disease)    Hemorrhoids    Hypertension    Hyperthyroidism    Hypothyroidism    IBS (irritable bowel syndrome)    Iritis, chronic    Macular hole of left eye 06/2017   PONV (postoperative nausea and vomiting)    Right leg DVT ([place])    Sleep apnea    uses CPAP nightly   Vitamin D insufficiency 06/16/2019    Surgeries: Procedure(s): OPEN REDUCTION INTERNAL FIXATION (ORIF) TIBIAL PLATEAU FRACTURE on 07/10/2021   Consultants (if any):   Discharged Condition: Improved  Hospital Course: [name] is an 56 y.o. female who was admitted 07/10/2021 with a diagnosis of Closed fracture of right tibial plateau and went to the operating room on 07/10/2021 and underwent the above named procedures.    She was given perioperative antibiotics:  Anti-infectives (From admission, onward)    Start     Dose/Rate Route Frequency Ordered Stop   07/10/21 1400  ceFAZolin (ANCEF) IVPB 2g/100 mL premix        2 g 200 mL/hr over 30 Minutes Intravenous Every 6 hours 07/10/21 1241  07/11/21 0222   07/10/21 0600  ceFAZolin (ANCEF) IVPB 2g/100 mL premix        2 g 200 mL/hr over 30 Minutes Intravenous On call to O.R. 07/10/21 [id number] 07/10/21 0813     .  She was given sequential compression devices, early ambulation, and *** for DVT prophylaxis.  She benefited maximally from the hospital stay and there were no complications.    Recent vital signs:  Vitals:   07/12/21 0720 07/12/21 1413  BP: (!) 157/83   Pulse: 100 98  Resp: 18 18  Temp: 98 F (36.7 C) 98 F (36.7 C)  SpO2: 96% 99%    Recent laboratory studies:  Lab Results  Component Value Date   HGB 10.2 (L) 07/12/2021   HGB 11.1 (L) 07/11/2021   HGB 12.3 07/10/2021   Lab Results  Component Value Date   WBC 13.5 (H) 07/12/2021   PLT 342 07/12/2021   Lab Results  Component Value Date   INR 1.8 (H) 07/10/2021   Lab Results  Component Value Date   NA 139 07/10/2021   K 4.4 07/10/2021   CL 102 07/10/2021   CO2 25 07/10/2021   BUN 12 07/10/2021   CREATININE 0.84 07/10/2021   GLUCOSE 155 (H) 07/10/2021    Discharge Medications:   Allergies as of 07/12/2021  Reactions   Amlodipine    Pedal edema    Cetirizine Hcl Other (See Comments)   Not effective   Erythromycin Nausea Only, Other (See Comments)   Caused a stomach ache   Escitalopram Oxalate Other (See Comments)   "No improvement"   Lisinopril Cough   Loratadine Other (See Comments)   "Not effective"   Nifedipine Other (See Comments)   "Fatigue and tremors"   Sertraline Hcl Other (See Comments)   "Did not help"   Vyvanse [lisdexamfetamine Dimesylate]    Headache and excitablility        Medication List     STOP taking these medications    cephALEXin 500 MG capsule Commonly known as: KEFLEX       TAKE these medications    acetaminophen 500 MG tablet Commonly known as: TYLENOL Take 1 tablet (500 mg total) by mouth every 12 (twelve) hours.   Advil 200 MG Caps Generic drug: Ibuprofen Take 400 mg by mouth every  6 (six) hours as needed (for mild pain).   albuterol 108 (90 Base) MCG/ACT inhaler Commonly known as: VENTOLIN HFA Inhale 2 puffs into the lungs every 6 (six) hours as needed for wheezing or shortness of breath.   albuterol (2.5 MG/3ML) 0.083% nebulizer solution Commonly known as: PROVENTIL Take 3 mLs (2.5 mg total) by nebulization every 4 (four) hours as needed for wheezing or shortness of breath.   INSULIN SYRINGE 1CC/31GX5/16" 31G X 5/16" 1 ML Misc See admin instructions.   BD Veo Insulin Syringe U/F 31G X 15/64" 1 ML Misc Generic drug: Insulin Syringe-Needle U-100   buPROPion 300 MG 24 hr tablet Commonly known as: WELLBUTRIN XL Take 1 tablet by mouth once daily   carvedilol 6.25 MG tablet Commonly known as: COREG Take 1 tablet (6.25 mg total) by mouth 2 (two) times daily with a meal.   cetirizine 10 MG tablet Commonly known as: ZYRTEC Take 10 mg by mouth daily as needed for allergies or rhinitis.   cyclobenzaprine 10 MG tablet Commonly known as: FLEXERIL Take 1 tablet (10 mg total) by mouth 3 (three) times daily as needed for muscle spasms. Caution of sedation   diphenhydrAMINE 25 mg capsule Commonly known as: BENADRYL Take 25-50 mg by mouth every 6 (six) hours as needed for allergies.   docusate sodium 100 MG capsule Commonly known as: COLACE Take 1 capsule (100 mg total) by mouth 2 (two) times daily.   esomeprazole 20 MG capsule Commonly known as: NEXIUM Take 1 capsule (20 mg total) by mouth daily at 12 noon. What changed: when to take this   folic acid 1 MG tablet Commonly known as: FOLVITE Take 3 mg by mouth See admin instructions. Take 3 mg by mouth once a day on Wed/Thurs/Fri/Sat   Humira Pen 40 MG/0.8ML Pnkt Generic drug: Adalimumab Inject 40 mg into the skin every Monday.   hydrochlorothiazide 12.5 MG tablet Commonly known as: HYDRODIURIL Take 12.5 mg by mouth daily.   hydrochlorothiazide 12.5 MG capsule Commonly known as: MICROZIDE Take 1  capsule by mouth once daily   ketorolac 0.5 % ophthalmic solution Commonly known as: ACULAR Place 1 drop into the left eye 2 (two) times daily.   leucovorin 5 MG tablet Commonly known as: WELLCOVORIN Take 5 mg by mouth See admin instructions. Take 5 mg by mouth once a day on only Sundays and Tuesdays   levothyroxine 25 MCG tablet Commonly known as: SYNTHROID Take 1 tablet (25 mcg total) by mouth daily. What changed: when  to take this   magnesium oxide 400 MG tablet Commonly known as: MAG-OX Take 400 mg by mouth daily.   metFORMIN 1000 MG tablet Commonly known as: GLUCOPHAGE TAKE 1 TABLET BY MOUTH TWICE DAILY WITH A MEAL What changed: when to take this   methocarbamol 500 MG tablet Commonly known as: Robaxin Take 1-2 tablets (500-1,000 mg total) by mouth every 8 (eight) hours as needed for muscle spasms. Caution of sedation What changed: how much to take   Methotrexate 25 MG/ML Sosy Inject 17.5 mg into the skin every Monday.   ondansetron 4 MG disintegrating tablet Commonly known as: ZOFRAN-ODT Take 1 tablet (4 mg total) by mouth every 8 (eight) hours as needed for nausea or vomiting.   OneTouch Delica Plus [id number] Misc SMARTSIG:1 Lancet(s) Topical Twice Daily PRN   OneTouch Verio test strip Generic drug: glucose blood To check glucose daily and prn for diabetes type 2   oxyCODONE-acetaminophen 5-325 MG tablet Commonly known as: PERCOCET/ROXICET Take 1 tablet by mouth every 4 (four) hours as needed. What changed: reasons to take this   prednisoLONE acetate 1 % ophthalmic suspension Commonly known as: PRED FORTE Place 4 drops into the left eye 2 (two) times daily.   Rivaroxaban Stater Pack (15 mg and 20 mg) Commonly known as: XARELTO STARTER PACK Follow package directions: Take one '15mg'$  tablet by mouth twice a day. On day 22, switch to one '20mg'$  tablet once a day. Take with food. What changed:  how much to take how to take this when to take this additional  instructions   rosuvastatin 5 MG tablet Commonly known as: Crestor Take 1 tablet (5 mg total) by mouth every other day. What changed:  when to take this additional instructions   Symbicort 160-4.5 MCG/ACT inhaler Generic drug: budesonide-formoterol Inhale 2 puffs into the lungs 2 (two) times daily. What changed:  when to take this reasons to take this   traMADol 50 MG tablet Commonly known as: ULTRAM Take 50 mg by mouth every 6 (six) hours as needed (for fibromyalgia flares).   VITAMIN D3 PO Take 1 capsule by mouth daily.               Durable Medical Equipment  (From admission, onward)           Start     Ordered   07/11/21 1521  For home use only DME 3 n 1  Once        07/11/21 1601   07/11/21 1520  For home use only DME Walker rolling  Once       Question Answer Comment  Walker: With 5 Inch Wheels   Patient needs a walker to treat with the following condition Gait abnormality      07/11/21 1601              Discharge Care Instructions  (From admission, onward)           Start     Ordered   07/11/21 0000  Non weight bearing       Question Answer Comment  Laterality right   Extremity Lower      07/11/21 0931            Diagnostic Studies: DG Humerus Right  Result Date: 07/12/2021 CLINICAL DATA:  Humerus pain after falling 1.5 weeks ago. EXAM: RIGHT HUMERUS - 2+ VIEW COMPARISON:  None Available. FINDINGS: There is no evidence of fracture or other focal bone lesions. Soft tissues are unremarkable. IMPRESSION: Negative.  Electronically Signed   By: Misty Stanley M.D.   On: 07/12/2021 13:03   DG Knee Right Port  Result Date: 07/10/2021 CLINICAL DATA:  Postop imaging. EXAM: PORTABLE RIGHT KNEE - 1-2 VIEW COMPARISON:  07/02/2021 FINDINGS: Postoperative changes from ORIF with plate and screw fixation of the lateral tibial plateau noted. Medial compartment joint space appears well preserved. No signs of hardware failure. Small joint effusion  with gas noted in the suprapatellar joint space. IMPRESSION: Status post ORIF of lateral tibial plateau fracture. Electronically Signed   By: Kerby Moors M.D.   On: 07/10/2021 11:10   DG Knee Complete 4 Views Right  Result Date: 07/10/2021 CLINICAL DATA:  [id number] ORIF of the fractured tibial plateau EXAM: RIGHT KNEE - COMPLETE 4+ VIEW COMPARISON:  None Available. FINDINGS: There has been interval ORIF changes with plate and screw devices transfix the fractured lateral tibial plateau. The femorotibial joint space is well-maintained. The alignment is near anatomical. Soft tissues are unremarkable. IMPRESSION: Plate and screw devices transfix the fractured tibial plateau. The alignment appears near anatomical. Electronically Signed   By: Frazier Richards M.D.   On: 07/10/2021 10:47   DG C-Arm 1-60 Min-No Report  Result Date: 07/10/2021 Fluoroscopy was utilized by the requesting physician.  No radiographic interpretation.   DG C-Arm 1-60 Min-No Report  Result Date: 07/10/2021 Fluoroscopy was utilized by the requesting physician.  No radiographic interpretation.   LE VENOUS  Result Date: 07/06/2021  Lower Venous DVT Study Patient Name:  SHAR PAEZ [facility]  Date of Exam:   07/05/2021 Medical Rec #: [id number]        Accession #:    [id number] Date of Birth: 02-28-1965        Patient Gender: F Patient Age:   84 years Exam Location:  [facility] Procedure:      VAS [place] LOWER EXTREMITY VENOUS (DVT) Referring Phys: CHRISTIAN PROSPERI --------------------------------------------------------------------------------  Indications: Pain, and Swelling.  Risk Factors: Status post tibial plateau fracture 07/02/21. Comparison Study: No prior study on file Performing Technologist: Sharion Dove RVS  Examination Guidelines: A complete evaluation includes B-mode imaging, spectral Doppler, color Doppler, and power Doppler as needed of all accessible portions of each vessel. Bilateral testing is considered an integral part of a  complete examination. Limited examinations for reoccurring indications may be performed as noted. The reflux portion of the exam is performed with the patient in reverse Trendelenburg.  +---------+---------------+---------+-----------+----------+--------------+ RIGHT    CompressibilityPhasicitySpontaneityPropertiesThrombus Aging +---------+---------------+---------+-----------+----------+--------------+ CFV      Full           Yes      Yes                                 +---------+---------------+---------+-----------+----------+--------------+ SFJ      Full                                                        +---------+---------------+---------+-----------+----------+--------------+ FV Prox  Full                                                        +---------+---------------+---------+-----------+----------+--------------+  FV Mid   Full                                                        +---------+---------------+---------+-----------+----------+--------------+ FV DistalFull                                                        +---------+---------------+---------+-----------+----------+--------------+ PFV      Full                                                        +---------+---------------+---------+-----------+----------+--------------+ POP      Partial        No       No                                  +---------+---------------+---------+-----------+----------+--------------+ PTV      None                                                        +---------+---------------+---------+-----------+----------+--------------+ PERO     None                                                        +---------+---------------+---------+-----------+----------+--------------+ Gastroc  Partial        No       No                                  +---------+---------------+---------+-----------+----------+--------------+    +----+---------------+---------+-----------+----------+--------------+ LEFTCompressibilityPhasicitySpontaneityPropertiesThrombus Aging +----+---------------+---------+-----------+----------+--------------+ CFV Full           Yes      Yes                                 +----+---------------+---------+-----------+----------+--------------+     Summary: RIGHT: - Findings consistent with acute deep vein thrombosis involving the right popliteal vein, right posterior tibial veins, right peroneal veins, and right gastrocnemius veins. - No cystic structure found in the popliteal fossa.  LEFT: - No evidence of common femoral vein obstruction.  *See table(s) above for measurements and observations. Electronically signed by Jamelle Haring on 07/06/2021 at 12:17:46 PM.    Final    CT Knee Right Wo Contrast  Result Date: 07/02/2021 CLINICAL DATA:  Knee trauma.  Tibial plateau fracture. EXAM: CT OF THE RIGHT KNEE WITHOUT CONTRAST TECHNIQUE: Multidetector CT imaging of the right knee was performed according to the standard protocol. Multiplanar CT image reconstructions were also generated. RADIATION DOSE REDUCTION: This  exam was performed according to the departmental dose-optimization program which includes automated exposure control, adjustment of the mA and/or kV according to patient size and/or use of iterative reconstruction technique. COMPARISON:  Radiographs same date. FINDINGS: Bones/Joint/Cartilage There is a comminuted and moderately depressed intra-articular fracture of the lateral tibial plateau. This fracture is associated with up to 8 mm of depression of the articular surface centrally. Nondisplaced fracture extends into the proximal tibiofibular articulation. There is medial extension of nondisplaced fractures along the tibial spines with involvement of the medial tibial plateau, best seen on the coronal images. The fibular head, distal femur and patella appear intact. There is a moderate sized  lipohemarthrosis. Ligaments Suboptimally assessed by CT. Muscles and Tendons The extensor mechanism is intact. No focal muscular abnormalities are identified. Soft tissues Moderate subcutaneous swelling anterolaterally in the proximal lower leg. No focal fluid collection, foreign body or soft tissue emphysema. IMPRESSION: 1. Moderately depressed and comminuted intra-articular fracture of the lateral tibial plateau as described. 2. Medial extension of a nondisplaced fractures across the tibial spines into the medial tibial plateau. 3. Moderate sized lipohemarthrosis. Electronically Signed   By: Richardean Sale M.D.   On: 07/02/2021 15:10   DG Pelvis 1-2 Views  Result Date: 07/02/2021 CLINICAL DATA:  Fall.  Bilateral hip pain. EXAM: PELVIS - 1-2 VIEW COMPARISON:  None Available. FINDINGS: There is no evidence of pelvic fracture or diastasis. No pelvic bone lesions are seen. Lower lumbar degenerative change. Scattered pelvic enthesopathy. IMPRESSION: No evidence of acute fracture or joint malalignment. Electronically Signed   By: Margaretha Sheffield M.D.   On: 07/02/2021 14:51   DG Knee Complete 4 Views Right  Result Date: 07/02/2021 CLINICAL DATA:  RIGHT knee pain and swelling post fall, slipped on water at charge EXAM: RIGHT KNEE - COMPLETE 4+ VIEW COMPARISON:  Compared to 09/29/2013 FINDINGS: Osseous demineralization. Depressed fracture involving the RIGHT lateral tibial plateau. Associated genu valgus. No additional fracture, dislocation, or bone destruction. Regional soft tissue swelling. Joint effusion with fat/fluid level. IMPRESSION: Depressed fracture of the RIGHT lateral tibial plateau with associated lipohemarthrosis. Electronically Signed   By: Lavonia Dana M.D.   On: 07/02/2021 13:02    Disposition: Discharge disposition: 06-Home-Health Care Svc       Discharge Instructions     Amb Referral to Osteoporosis   Complete by: As directed    Low energy tibial plateau fracture   Call MD / Call  [phone]   Complete by: As directed    If you experience chest pain or shortness of breath, CALL 911 and be transported to the hospital emergency room.  If you develope a fever above 101 F, pus (white drainage) or increased drainage or redness at the wound, or calf pain, call your surgeon's office.   Constipation Prevention   Complete by: As directed    Drink plenty of fluids.  Prune juice may be helpful.  You may use a stool softener, such as Colace (over the counter) 100 mg twice a day.  Use MiraLax (over the counter) for constipation as needed.   Diet Carb Modified   Complete by: As directed    Discharge instructions   Complete by: As directed    Orthopaedic Trauma Service Discharge Instructions   General Discharge Instructions  Orthopaedic Injuries:  Right tibial plateau fracture treated with open reduction internal fixation using plate and screws  WEIGHT BEARING STATUS: Nonweightbearing right leg, use walker for ambulation.  RANGE OF MOTION/ACTIVITY: Unrestricted range of motion right knee.  Do not rest with the knee in flexion.  Use bone foam to keep knee fully extended when at rest.  This will help prevent flexion contracture.  Activity as tolerated while maintaining weightbearing restrictions.  Continue to do home exercises that therapy taught you  Bone health: Labs show vitamin D insufficiency continue to take vitamin D supplementation  Review the following resource for additional information regarding bone health  asphaltmakina.com  Wound Care: Daily wound care starting on 07/13/2021.  Please see below Discharge Wound Care Instructions  Do NOT apply any ointments, solutions or lotions to pin sites or surgical wounds.  These prevent needed drainage and even though solutions like hydrogen peroxide kill bacteria, they also damage cells lining the pin sites that help fight infection.  Applying lotions or ointments can keep the wounds moist and can cause them to  breakdown and open up as well. This can increase the risk for infection. When in doubt call the office.  Surgical incisions should be dressed daily.  If any drainage is noted, use one layer of adaptic or Mepitel, then gauze, Kerlix, and an ace wrap. You can use a compression sock instead of an ace wrap  [email or web address].fr [email or web address].com.[email or web address]  CheapWipes.gl  These dressing supplies should be available at local medical supply stores ([facility] [employer], [facility], etc). They are not usually carried at places like [facility], [facility], [employer], etc  Once the incision is completely dry and without drainage, it may be left open to air out.  Showering may begin 36-48 hours later.  Cleaning gently with soap and water.   Diet: as you were eating previously.  Can use over the counter stool softeners and bowel preparations, such as Miralax, to help with bowel movements.  Narcotics can be constipating.  Be sure to drink plenty of fluids  PAIN MEDICATION USE AND EXPECTATIONS  You have likely been given narcotic medications to help control your pain.  After a traumatic event that results in an fracture (broken bone) with or without surgery, it is ok to use narcotic pain medications to help control one's pain.  We understand that everyone responds to pain differently and each individual patient will be evaluated on a regular basis for the continued need for narcotic medications. Ideally, narcotic medication use should last no more than 6-8 weeks (coinciding with fracture healing).   As a patient it is your responsibility as well to monitor narcotic medication use and report the amount and frequency you use these medications when you come to your office visit.   We would also advise that if you  are using narcotic medications, you should take a dose prior to therapy to maximize you participation.  IF YOU ARE ON NARCOTIC MEDICATIONS IT IS NOT PERMISSIBLE TO OPERATE A MOTOR VEHICLE (MOTORCYCLE/CAR/TRUCK/MOPED) OR HEAVY MACHINERY DO NOT MIX NARCOTICS WITH OTHER CNS (CENTRAL NERVOUS SYSTEM) DEPRESSANTS SUCH AS ALCOHOL   POST-OPERATIVE OPIOID TAPER INSTRUCTIONS:  It is important to wean off of your opioid medication as soon as possible. If you do not need pain medication after your surgery it is ok to stop day one.  Opioids include:  o Codeine, Hydrocodone(Norco, Vicodin), Oxycodone(Percocet, oxycontin) and hydromorphone amongst others.   Long term and even short term use of opiods can cause:  o Increased pain response  o Dependence  o Constipation  o Depression  o Respiratory depression  o And more.   Withdrawal symptoms can include  o Flu like symptoms  o Nausea, vomiting  o And more  Techniques  to manage these symptoms  o Hydrate well  o Eat regular healthy meals  o Stay active  o Use relaxation techniques(deep breathing, meditating, yoga)  Do Not substitute Alcohol to help with tapering  If you have been on opioids for less than two weeks and do not have pain than it is ok to stop all together.   Plan to wean off of opioids  o This plan should start within one week post op of your fracture surgery   o Maintain the same interval or time between taking each dose and first decrease the dose.   o Cut the total daily intake of opioids by one tablet each day  o Next start to increase the time between doses.  o The last dose that should be eliminated is the evening dose.    STOP SMOKING OR USING NICOTINE PRODUCTS!!!!  As discussed nicotine severely impairs your body's ability to heal surgical and traumatic wounds but also impairs bone healing.  Wounds and bone heal by forming microscopic blood vessels (angiogenesis) and nicotine is a vasoconstrictor (essentially, shrinks  blood vessels).  Therefore, if vasoconstriction occurs to these microscopic blood vessels they essentially disappear and are unable to deliver necessary nutrients to the healing tissue.  This is one modifiable factor that you can do to dramatically increase your chances of healing your injury.    (This means no smoking, no nicotine gum, patches, etc)  DO NOT USE NONSTEROIDAL ANTI-INFLAMMATORY DRUGS (NSAID'S)  Using products such as Advil (ibuprofen), Aleve (naproxen), Motrin (ibuprofen) for additional pain control during fracture healing can delay and/or prevent the healing response.  If you would like to take over the counter (OTC) medication, Tylenol (acetaminophen) is ok.  However, some narcotic medications that are given for pain control contain acetaminophen as well. Therefore, you should not exceed more than 4000 mg of tylenol in a day if you do not have liver disease.  Also note that there are may OTC medicines, such as cold medicines and allergy medicines that my contain tylenol as well.  If you have any questions about medications and/or interactions please ask your doctor/PA or your pharmacist.      ICE AND ELEVATE INJURED/OPERATIVE EXTREMITY  Using ice and elevating the injured extremity above your heart can help with swelling and pain control.  Icing in a pulsatile fashion, such as 20 minutes on and 20 minutes off, can be followed.    Do not place ice directly on skin. Make sure there is a barrier between to skin and the ice pack.    Using frozen items such as frozen peas works well as the conform nicely to the are that needs to be iced.  USE AN ACE WRAP OR TED HOSE FOR SWELLING CONTROL  In addition to icing and elevation, Ace wraps or TED hose are used to help limit and resolve swelling.  It is recommended to use Ace wraps or TED hose until you are informed to stop.    When using Ace Wraps start the wrapping distally (farthest away from the body) and wrap proximally (closer to the  body)   Example: If you had surgery on your leg or thing and you do not have a splint on, start the ace wrap at the toes and work your way up to the thigh        If you had surgery on your upper extremity and do not have a splint on, start the ace wrap at your fingers and work your  way up to the upper arm  IF YOU ARE IN A SPLINT OR CAST DO NOT REMOVE IT FOR ANY REASON   If your splint gets wet for any reason please contact the office immediately. You may shower in your splint or cast as long as you keep it dry.  This can be done by wrapping in a cast cover or garbage back (or similar)  Do Not stick any thing down your splint or cast such as pencils, money, or hangers to try and scratch yourself with.  If you feel itchy take benadryl as prescribed on the bottle for itching  IF YOU ARE IN A CAM BOOT (BLACK BOOT)  You may remove boot periodically. Perform daily dressing changes as noted below.  Wash the liner of the boot regularly and wear a sock when wearing the boot. It is recommended that you sleep in the boot until told otherwise    Call office for the following: ? Temperature greater than 101F ? Persistent nausea and vomiting ? Severe uncontrolled pain ? Redness, tenderness, or signs of infection (pain, swelling, redness, odor or green/yellow discharge around the site) ? Difficulty breathing, headache or visual disturbances ? Hives ? Persistent dizziness or light-headedness ? Extreme fatigue ? Any other questions or concerns you may have after discharge  In an emergency, call 911 or go to an Emergency Department at a nearby hospital  HELPFUL INFORMATION  ? If you had a block, it will wear off between 8-24 hrs postop typically.  This is period when your pain may go from nearly zero to the pain you would have had postop without the block.  This is an abrupt transition but nothing dangerous is happening.  You may take an extra dose of narcotic when this happens.  ? You should wean off  your narcotic medicines as soon as you are able.  Most patients will be off or using minimal narcotics before their first postop appointment.   ? We suggest you use the pain medication the first night prior to going to bed, in order to ease any pain when the anesthesia wears off. You should avoid taking pain medications on an empty stomach as it will make you nauseous.  ? Do not drink alcoholic beverages or take illicit drugs when taking pain medications.  ? In most states it is against the law to drive while you are in a splint or sling.  And certainly against the law to drive while taking narcotics.  ? You may return to work/school in the next couple of days when you feel up to it.   ? Pain medication may make you constipated.  Below are a few solutions to try in this order:   ? Decrease the amount of pain medication if you aren't having pain.   ? Drink lots of decaffeinated fluids.   ? Drink prune juice and/or each dried prunes   o If the first 3 don't work start with additional solutions   ? Take Colace - an over-the-counter stool softener   ? Take Senokot - an over-the-counter laxative   ? Take Miralax - a stronger over-the-counter laxative     CALL THE OFFICE WITH ANY QUESTIONS OR CONCERNS: [phone]   VISIT OUR WEBSITE FOR ADDITIONAL INFORMATION: [email or web address]   Do not put a pillow under the knee. Place it under the heel.   Complete by: As directed    Driving restrictions   Complete by: As directed    No driving for 9  weeks   Increase activity slowly as tolerated   Complete by: As directed    Non weight bearing   Complete by: As directed    Laterality: right   Extremity: Lower   Post-operative opioid taper instructions:   Complete by: As directed    POST-OPERATIVE OPIOID TAPER INSTRUCTIONS: It is important to wean off of your opioid medication as soon as possible. If you do not need pain medication after your surgery it is ok to stop day one. Opioids  include: Codeine, Hydrocodone(Norco, Vicodin), Oxycodone(Percocet, oxycontin) and hydromorphone amongst others.  Long term and even short term use of opiods can cause: Increased pain response Dependence Constipation Depression Respiratory depression And more.  Withdrawal symptoms can include Flu like symptoms Nausea, vomiting And more Techniques to manage these symptoms Hydrate well Eat regular healthy meals Stay active Use relaxation techniques(deep breathing, meditating, yoga) Do Not substitute Alcohol to help with tapering If you have been on opioids for less than two weeks and do not have pain than it is ok to stop all together.  Plan to wean off of opioids This plan should start within one week post op of your joint replacement. Maintain the same interval or time between taking each dose and first decrease the dose.  Cut the total daily intake of opioids by one tablet each day Next start to increase the time between doses. The last dose that should be eliminated is the evening dose.           Follow-up Information     Altamese Cairo, MD. Schedule an appointment as soon as possible for a visit in 2 week(s).   Specialty: Orthopedic Surgery Contact information: [place] [address] [place], [place] Follow up.   Specialty: [place] Why: home health serviceas will be provided by [facility] information: [address] [place] [address] [phone]                  Signed: Ventura Bruns PA-C 07/12/2021, 2:28 PM

## 2021-07-12 NOTE — Progress Notes (Addendum)
Patient ID: [name] MRN: [id number] DOB/AGE: [id number] y.o.  Subjective:  Doing ok, worried about going home and not having good pain control. She gets spasms which increase her pain. Pain meds do help. Patient complaining of right arm pain, mainly lateral shoulder and mid humerus, worse with movement. States she was supposed to get an x-ray of her right arm but never did. Denies numbness or tingling.  No CP, no SOB No n/v No h/a No dizziness   Lives on 2nd floor, will have to go up flight of stairs when she discharges  + DVT diagnosed pre-op, on treatment dose xarelto   ROS As above  Objective:   VITALS:   Vitals:   07/11/21 2040 07/11/21 2209 07/12/21 0419 07/12/21 0720  BP:  (!) 166/86 (!) 171/95   Pulse:  (!) 106 95   Resp:  18 16   Temp:  99.6 F (37.6 C) 98.4 F (36.9 C)   TempSrc:  Oral Oral   SpO2: 95% 96% 97% 96%  Weight:      Height:        Estimated body mass index is 31.38 kg/m as calculated from the following:   Height as of this encounter: '5\' 8"'$  (1.727 m).   Weight as of this encounter: 93.6 kg.   Intake/Output      07/07 0701 07/08 0700 07/08 0701 07/09 0700   P.O.     I.V. (mL/kg)     IV Piggyback     Total Intake(mL/kg)     Urine (mL/kg/hr) 800 (0.4)    Blood     Total Output 800    Net -800         Urine Occurrence 5 x      LABS  Results for orders placed or performed during the hospital encounter of 07/10/21 (from the past 24 hour(s))  Glucose, capillary     Status: Abnormal   Collection Time: 07/11/21 11:33 AM  Result Value Ref Range   Glucose-Capillary 230 (H) 70 - 99 mg/dL   Comment 1 Document in Chart   Glucose, capillary     Status: Abnormal   Collection Time: 07/11/21  3:31 PM  Result Value Ref Range   Glucose-Capillary 142 (H) 70 - 99 mg/dL  CBC     Status: Abnormal   Collection Time: 07/12/21  1:49 AM  Result Value Ref Range   WBC 13.5 (H) 4.0 - 10.5 K/uL   RBC 3.47 (L) 3.87 - 5.11 MIL/uL    Hemoglobin 10.2 (L) 12.0 - 15.0 g/dL   HCT 31.4 (L) 36.0 - 46.0 %   MCV 90.5 80.0 - 100.0 fL   MCH 29.4 26.0 - 34.0 pg   MCHC 32.5 30.0 - 36.0 g/dL   RDW 14.6 11.5 - 15.5 %   Platelets 342 150 - 400 K/uL   nRBC 0.0 0.0 - 0.2 %     PHYSICAL EXAM:   Gen: awake, alert, sitting up in bed, eating breakfast Lungs: unlabored Cardiac: regular Ext:       Right Lower extremity   Dressing clean, dry and intact  Hinged brace fitting well   Unlocked      Ext warm   Mild swelling   DPN, SPN, TN sensory functions intact  EHL, FHL, lesser toe motor function intact  Ankle flexion, extension, inversion eversion intact  RUE: 0-170 FF right shoulder. No pain with movement at shoulder. No TTP to lateral right shoulder. Full ROM right elbow, wrist, fingers.  Distal sensation intact. Hand warm.   Assessment/Plan: 2 Days Post-Op   Principal Problem:   Closed fracture of right tibial plateau Active Problems:   Depression with anxiety   Essential hypertension   Asthma, mild intermittent, well-controlled   ADD (attention deficit disorder)   Type 2 diabetes mellitus with hyperglycemia (HCC)   Right leg DVT (HCC)   Anti-infectives (From admission, onward)    Start     Dose/Rate Route Frequency Ordered Stop   07/10/21 1400  ceFAZolin (ANCEF) IVPB 2g/100 mL premix        2 g 200 mL/hr over 30 Minutes Intravenous Every 6 hours 07/10/21 1241 07/11/21 0222   07/10/21 0600  ceFAZolin (ANCEF) IVPB 2g/100 mL premix        2 g 200 mL/hr over 30 Minutes Intravenous On call to O.R. 07/10/21 4132 07/10/21 0813     .  POD/HD#: 83  56 year old female closed right lateral tibial plateau fracture  -Closed right lateral tibial plateau fracture s/p ORIF (shatzker 2)  NWB R leg x 6 weeks   Mobilize with assistance  Unrestricted ROM R knee  Hinged knee brace   Unlocked for full ROM   Bone foam when in bed to maintain full extension   PT/OT evals  Daily dressing changes starting on 07/13/2021    PT-  please teach HEP for right knee ROM- AROM, PROM. Prone exercises as well. No ROM restrictions.  Quad sets, SLR, LAQ, SAQ, heel slides, stretching, prone flexion and extension. Ankle theraband program, heel cord stretching, toe towel curls, etc  No pillows under bend of knee when at rest, ok to place under heel to help work on extension. Can also use zero knee bone foam if available. Patient asked about bone foam today, will try to order, but she may not be able to get. Hinged knee brace on at all times, unlocked.  Ok to work on [employer] without brace with therapist supervision.  Brace back on after ROM session if removed  - right arm pain  Patient complaining of right arm pain. States that she was supposed to get an x-ray. X-ray negative, will plan to follow up on this as outpatient, likely contusion vs. Impingement.     - Pain management:  Multimodal: Tylenol, Percocet, Robaxin  - ABL anemia/Hemodynamics  Stable  BPs improved  - Medical issues   Home meds   - DVT/PE prophylaxis:  On treatment dose xarelto  - ID:   Periop abx  - Metabolic Bone Disease:  Vitamin d levels in insufficient range    Continue supplementation - Activity:  As above  - FEN/GI prophylaxis/Foley/Lines:  Carb mod diet   - Impediments to fracture healing:  Diabetes  Vitamin d insufficiency   - Dispo: Discussed with patient, plan to discharge home this afternoon.  Weightbearing: NWB RLE ROM: unrestricted ROM R knee\  No pillows under knee when at rest to prevent contracture   Insicional and dressing care: Daily dressing changes with mepitel/adaptic, 4x4 gauze and tape or compression sock starting on 07/13/2021  if patient still inpatient Pain management:  multimodal: tylenol, percocet, robaxin Bone Health/Optimization: continue vitamin d supplementation  Orthopedic device(s):  hinged brace, walker Showering: ok to shower and clean wounds with soap and water once incision is dry  VTE prophylaxis: on  treatment dose xarelto for acute R leg dvt   Follow - up plan: 2 weeks   Merlene Pulling, PA-C 07/12/2021, 7:52 AM

## 2021-07-14 ENCOUNTER — Encounter: Payer: Self-pay | Admitting: Family Medicine

## 2021-07-23 ENCOUNTER — Other Ambulatory Visit: Payer: Self-pay | Admitting: Family Medicine

## 2021-07-29 NOTE — Progress Notes (Signed)
Note ammended

## 2021-08-22 DIAGNOSIS — R7401 Elevation of levels of liver transaminase levels: Secondary | ICD-10-CM | POA: Diagnosis not present

## 2021-08-22 DIAGNOSIS — H35342 Macular cyst, hole, or pseudohole, left eye: Secondary | ICD-10-CM | POA: Diagnosis not present

## 2021-08-22 DIAGNOSIS — H35353 Cystoid macular degeneration, bilateral: Secondary | ICD-10-CM | POA: Diagnosis not present

## 2021-08-22 DIAGNOSIS — Z961 Presence of intraocular lens: Secondary | ICD-10-CM | POA: Diagnosis not present

## 2021-08-22 DIAGNOSIS — H3023 Posterior cyclitis, bilateral: Secondary | ICD-10-CM | POA: Diagnosis not present

## 2021-08-22 DIAGNOSIS — Z79899 Other long term (current) drug therapy: Secondary | ICD-10-CM | POA: Diagnosis not present

## 2021-08-25 ENCOUNTER — Encounter: Payer: Self-pay | Admitting: Family Medicine

## 2021-08-26 DIAGNOSIS — H10522 Angular blepharoconjunctivitis, left eye: Secondary | ICD-10-CM | POA: Diagnosis not present

## 2021-08-26 DIAGNOSIS — H04222 Epiphora due to insufficient drainage, left lacrimal gland: Secondary | ICD-10-CM | POA: Diagnosis not present

## 2021-08-26 DIAGNOSIS — H02886 Meibomian gland dysfunction of left eye, unspecified eyelid: Secondary | ICD-10-CM | POA: Diagnosis not present

## 2021-08-26 DIAGNOSIS — H02883 Meibomian gland dysfunction of right eye, unspecified eyelid: Secondary | ICD-10-CM | POA: Diagnosis not present

## 2021-08-26 DIAGNOSIS — Z961 Presence of intraocular lens: Secondary | ICD-10-CM | POA: Diagnosis not present

## 2021-08-26 LAB — HM DIABETES EYE EXAM

## 2021-08-27 ENCOUNTER — Ambulatory Visit: Payer: Medicare HMO | Admitting: Allergy & Immunology

## 2021-08-29 ENCOUNTER — Other Ambulatory Visit: Payer: Self-pay | Admitting: Family Medicine

## 2021-09-01 ENCOUNTER — Ambulatory Visit (INDEPENDENT_AMBULATORY_CARE_PROVIDER_SITE_OTHER): Payer: Medicare HMO | Admitting: Family Medicine

## 2021-09-01 ENCOUNTER — Encounter: Payer: Self-pay | Admitting: Family Medicine

## 2021-09-01 VITALS — BP 128/90 | Ht 68.0 in | Wt 206.0 lb

## 2021-09-01 DIAGNOSIS — E559 Vitamin D deficiency, unspecified: Secondary | ICD-10-CM | POA: Diagnosis not present

## 2021-09-01 DIAGNOSIS — S82141S Displaced bicondylar fracture of right tibia, sequela: Secondary | ICD-10-CM | POA: Diagnosis not present

## 2021-09-01 DIAGNOSIS — E2839 Other primary ovarian failure: Secondary | ICD-10-CM

## 2021-09-01 DIAGNOSIS — E039 Hypothyroidism, unspecified: Secondary | ICD-10-CM | POA: Diagnosis not present

## 2021-09-01 MED ORDER — VITAMIN D (ERGOCALCIFEROL) 1.25 MG (50000 UNIT) PO CAPS: 50000.0000 [IU] | ORAL_CAPSULE | ORAL | 0 refills | Status: DC

## 2021-09-01 NOTE — Assessment & Plan Note (Signed)
Acute on chronic in nature. -Counseled on supportive care. -Check TSH, free T3 and free T4.

## 2021-09-01 NOTE — Patient Instructions (Signed)
Nice to meet you I will call with the results from today We'll get the bone density oat [facility] and they should call to schedule   Please send me a message in [place] with any questions or updates.  We'll follow up based on the results. .   --Dr. Raeford Razor

## 2021-09-01 NOTE — Assessment & Plan Note (Signed)
Osteoporosis appears to be the main cause of a contributing factor with her most recent lateral tibial plateau fracture given the low trauma. -Counseled supportive care. -Check PTH and intact calcium, TSH and free T3 and T4. Check path review panel and bone density.

## 2021-09-01 NOTE — Telephone Encounter (Signed)
Pt had an acute appt this year but no recent f/u or CPE and it's been over a year since last TSH, no future appts, please advise

## 2021-09-01 NOTE — Assessment & Plan Note (Addendum)
Has found to be low  -Provided vitamin D. -Check vitamin D.

## 2021-09-01 NOTE — Assessment & Plan Note (Addendum)
Initial injury occurred on 6/28.  Underwent surgery for this low trauma fracture.  Has been taking methotrexate for some time but unclear if contributing to possible osteoporosis.  She does have a family history of osteoporosis. -Counseled on home exercise therapy and supportive care. -Concern for osteoporosis and will check bone density.

## 2021-09-01 NOTE — Telephone Encounter (Signed)
Please schedule cpe and refill until then

## 2021-09-01 NOTE — Progress Notes (Signed)
[name] - 56 y.o. female MRN [id number]  Date of birth: [birth year]  SUBJECTIVE:  Including CC & ROS.  No chief complaint on file.   [name] is a 56 y.o. female that is presenting with questions regarding osteoporosis.  She had a little trauma fall this past summer and found to have a lateral tibial plateau fracture.  She had subsequent surgery.  She also was found to have blood clots and started on Xarelto.  She has a history of iritis and been on methotrexate for 10 years.  No loss of height.  No prednisone use and no smoking.  No history of cancer.  Does have a family history of osteoporosis.   Review of Systems See HPI   HISTORY: Past Medical, Surgical, Social, and Family History Reviewed & Updated per EMR.   Pertinent Historical Findings include:  Past Medical History:  Diagnosis Date   ADHD (attention deficit hyperactivity disorder)    ADD - no meds   Allergy    allergic rhinitis   Anxiety    Asthma    Chronic headaches    Degenerative disc disease    Depression    Diabetes mellitus without complication ([place])    type 2   Eczema    no current problems as of 07/09/21   Fatty liver    Fibromyalgia    Fx of fibula 2009   GERD (gastroesophageal reflux disease)    Hemorrhoids    Hypertension    Hyperthyroidism    Hypothyroidism    IBS (irritable bowel syndrome)    Iritis, chronic    Macular hole of left eye 06/2017   PONV (postoperative nausea and vomiting)    Right leg DVT ([place])    Sleep apnea    uses CPAP nightly   Vitamin D insufficiency 06/16/2019    Past Surgical History:  Procedure Laterality Date   ABDOMINAL HYSTERECTOMY     ANTERIOR CRUCIATE LIGAMENT REPAIR Left 1996   small tear of ACL   BREAST EXCISIONAL BIOPSY Left 1995   BREAST SURGERY     left nipple removed - benign tumor   BUNIONECTOMY Bilateral    COLONOSCOPY     EYE SURGERY     cataracts removed, macular hole repaired left eye- both surgeries done at [facility]  08/2004    endometriosis - several    ORIF TIBIA FRACTURE Right 07/10/2021   Procedure: OPEN REDUCTION INTERNAL FIXATION (ORIF) TIBIAL PLATEAU FRACTURE;  Surgeon: Altamese [place], MD;  Location: [facility];  Service: Orthopedics;  Laterality: Right;   WISDOM TOOTH EXTRACTION       PHYSICAL EXAM:  VS: BP (!) 128/90 (BP Location: Left Arm, Patient Position: Sitting)   Ht '5\' 8"'$  (1.727 m)   Wt 206 lb (93.4 kg)   LMP 08/05/2006   BMI 31.32 kg/m  Physical Exam Gen: NAD, alert, cooperative with exam, well-appearing MSK:  Neurovascularly intact       ASSESSMENT & PLAN:   Hypothyroid Acute on chronic in nature. -Counseled on supportive care. -Check TSH, free T3 and free T4.  Closed fracture of right tibial plateau Initial injury occurred on 6/28.  Underwent surgery for this low trauma fracture.  Has been taking methotrexate for some time but unclear if contributing to possible osteoporosis.  She does have a family history of osteoporosis. -Counseled on home exercise therapy and supportive care. -Concern for osteoporosis and will check bone density.  Vitamin D deficiency Has found to be low  -Provided  vitamin D. -Check vitamin D.  Estrogen deficiency Osteoporosis appears to be the main cause of a contributing factor with her most recent lateral tibial plateau fracture given the low trauma. -Counseled supportive care. -Check PTH and intact calcium, TSH and free T3 and T4. Check path review panel and bone density.

## 2021-09-02 LAB — T4, FREE: Free T4: 1.08 ng/dL (ref 0.82–1.77)

## 2021-09-02 LAB — PTH, INTACT AND CALCIUM
Calcium: 9.8 mg/dL (ref 8.7–10.2)
PTH: 15 pg/mL (ref 15–65)

## 2021-09-02 LAB — T3, FREE: T3, Free: 3.1 pg/mL (ref 2.0–4.4)

## 2021-09-02 LAB — VITAMIN D 25 HYDROXY (VIT D DEFICIENCY, FRACTURES): Vit D, 25-Hydroxy: 37.8 ng/mL (ref 30.0–100.0)

## 2021-09-03 LAB — PATHOLOGIST SMEAR REVIEW
Basophils Absolute: 0.1 10*3/uL (ref 0.0–0.2)
Basos: 1 %
EOS (ABSOLUTE): 0.4 10*3/uL (ref 0.0–0.4)
Eos: 4 %
Hematocrit: 42.4 % (ref 34.0–46.6)
Hemoglobin: 13.5 g/dL (ref 11.1–15.9)
Immature Grans (Abs): 0 10*3/uL (ref 0.0–0.1)
Immature Granulocytes: 0 %
Lymphocytes Absolute: 4.8 10*3/uL — ABNORMAL HIGH (ref 0.7–3.1)
Lymphs: 44 %
MCH: 27.8 pg (ref 26.6–33.0)
MCHC: 31.8 g/dL (ref 31.5–35.7)
MCV: 87 fL (ref 79–97)
Monocytes Absolute: 0.7 10*3/uL (ref 0.1–0.9)
Monocytes: 7 %
Neutrophils Absolute: 4.9 10*3/uL (ref 1.4–7.0)
Neutrophils: 44 %
Path Rev PLTs: NORMAL
Path Rev RBC: NORMAL
Platelets: 288 10*3/uL (ref 150–450)
RBC: 4.85 x10E6/uL (ref 3.77–5.28)
RDW: 14.3 % (ref 11.7–15.4)
WBC: 10.9 10*3/uL — ABNORMAL HIGH (ref 3.4–10.8)

## 2021-09-03 NOTE — Telephone Encounter (Signed)
Please call patient and scheduled CPE. Send back to CMA for refill after appointment is scheduled.

## 2021-09-03 NOTE — Telephone Encounter (Signed)
Patient has been scheduled

## 2021-09-04 ENCOUNTER — Ambulatory Visit: Payer: Medicare HMO | Admitting: Internal Medicine

## 2021-09-05 ENCOUNTER — Telehealth: Payer: Self-pay | Admitting: Family Medicine

## 2021-09-05 NOTE — Telephone Encounter (Signed)
Left VM for patient. If she calls back please have her speak with a nurse/CMA and inform that results show that she has a mild lymphocytosis.  This is a specific type of cell line.  It was recommended to repeat her blood panel in another month or 2.  All of her other lab work was normal.   If any questions then please take the best time and phone number to call and I will try to call her back.   Rosemarie Ax, MD [facility] 09/05/2021, 10:52 AM

## 2021-09-05 NOTE — Telephone Encounter (Signed)
Pt informed of below.  

## 2021-09-16 ENCOUNTER — Ambulatory Visit (HOSPITAL_COMMUNITY)
Admission: RE | Admit: 2021-09-16 | Discharge: 2021-09-16 | Disposition: A | Discharge status: Home / Self Care | Payer: Worker's Compensation | Source: Ambulatory Visit | Attending: Family Medicine | Admitting: Family Medicine

## 2021-09-16 ENCOUNTER — Telehealth: Payer: Self-pay | Admitting: Family Medicine

## 2021-09-16 DIAGNOSIS — E2839 Other primary ovarian failure: Secondary | ICD-10-CM

## 2021-09-16 DIAGNOSIS — M85851 Other specified disorders of bone density and structure, right thigh: Secondary | ICD-10-CM | POA: Diagnosis not present

## 2021-09-16 DIAGNOSIS — E559 Vitamin D deficiency, unspecified: Secondary | ICD-10-CM

## 2021-09-16 DIAGNOSIS — S82141S Displaced bicondylar fracture of right tibia, sequela: Secondary | ICD-10-CM

## 2021-09-16 DIAGNOSIS — E1169 Type 2 diabetes mellitus with other specified complication: Secondary | ICD-10-CM

## 2021-09-16 DIAGNOSIS — I10 Essential (primary) hypertension: Secondary | ICD-10-CM

## 2021-09-16 DIAGNOSIS — Z780 Asymptomatic menopausal state: Secondary | ICD-10-CM | POA: Diagnosis not present

## 2021-09-16 DIAGNOSIS — E039 Hypothyroidism, unspecified: Secondary | ICD-10-CM

## 2021-09-16 DIAGNOSIS — E1165 Type 2 diabetes mellitus with hyperglycemia: Secondary | ICD-10-CM

## 2021-09-16 NOTE — Telephone Encounter (Signed)
-----   Message from Ellamae Sia sent at 09/04/2021  8:42 AM EDT ----- Regarding: Lab orders for Wednesday, 9.13.23 Patient is scheduled for CPX labs, please order future labs, Thanks , Karna Christmas

## 2021-09-16 NOTE — Telephone Encounter (Signed)
Informed of results.   Rosemarie Ax, MD [facility] 09/16/2021, 3:37 PM

## 2021-09-17 ENCOUNTER — Other Ambulatory Visit (INDEPENDENT_AMBULATORY_CARE_PROVIDER_SITE_OTHER): Payer: Medicare HMO

## 2021-09-17 DIAGNOSIS — E559 Vitamin D deficiency, unspecified: Secondary | ICD-10-CM | POA: Diagnosis not present

## 2021-09-17 DIAGNOSIS — I10 Essential (primary) hypertension: Secondary | ICD-10-CM

## 2021-09-17 DIAGNOSIS — E1169 Type 2 diabetes mellitus with other specified complication: Secondary | ICD-10-CM

## 2021-09-17 DIAGNOSIS — E039 Hypothyroidism, unspecified: Secondary | ICD-10-CM | POA: Diagnosis not present

## 2021-09-17 DIAGNOSIS — E785 Hyperlipidemia, unspecified: Secondary | ICD-10-CM

## 2021-09-17 DIAGNOSIS — E1165 Type 2 diabetes mellitus with hyperglycemia: Secondary | ICD-10-CM

## 2021-09-17 LAB — CBC WITH DIFFERENTIAL/PLATELET
Basophils Absolute: 0.1 10*3/uL (ref 0.0–0.1)
Basophils Relative: 1.3 % (ref 0.0–3.0)
Eosinophils Absolute: 0.5 10*3/uL (ref 0.0–0.7)
Eosinophils Relative: 5.6 % — ABNORMAL HIGH (ref 0.0–5.0)
HCT: 40.7 % (ref 36.0–46.0)
Hemoglobin: 13.2 g/dL (ref 12.0–15.0)
Lymphocytes Relative: 48 % — ABNORMAL HIGH (ref 12.0–46.0)
Lymphs Abs: 3.9 10*3/uL (ref 0.7–4.0)
MCHC: 32.6 g/dL (ref 30.0–36.0)
MCV: 87.1 fl (ref 78.0–100.0)
Monocytes Absolute: 0.4 10*3/uL (ref 0.1–1.0)
Monocytes Relative: 5.5 % (ref 3.0–12.0)
Neutro Abs: 3.2 10*3/uL (ref 1.4–7.7)
Neutrophils Relative %: 39.6 % — ABNORMAL LOW (ref 43.0–77.0)
Platelets: 281 10*3/uL (ref 150.0–400.0)
RBC: 4.67 Mil/uL (ref 3.87–5.11)
RDW: 15.7 % — ABNORMAL HIGH (ref 11.5–15.5)
WBC: 8.1 10*3/uL (ref 4.0–10.5)

## 2021-09-17 LAB — COMPREHENSIVE METABOLIC PANEL
ALT: 10 U/L (ref 0–35)
AST: 30 U/L (ref 0–37)
Albumin: 4 g/dL (ref 3.5–5.2)
Alkaline Phosphatase: 102 U/L (ref 39–117)
BUN: 12 mg/dL (ref 6–23)
CO2: 24 mEq/L (ref 19–32)
Calcium: 9.5 mg/dL (ref 8.4–10.5)
Chloride: 101 mEq/L (ref 96–112)
Creatinine, Ser: 0.8 mg/dL (ref 0.40–1.20)
GFR: 83.63 mL/min (ref >60.00)
Glucose, Bld: 102 mg/dL — ABNORMAL HIGH (ref 70–99)
Potassium: 4.4 mEq/L (ref 3.5–5.1)
Sodium: 137 mEq/L (ref 135–145)
Total Bilirubin: 0.6 mg/dL (ref 0.2–1.2)
Total Protein: 7.3 g/dL (ref 6.0–8.3)

## 2021-09-17 LAB — TSH: TSH: 6.41 u[IU]/mL — ABNORMAL HIGH (ref 0.35–5.50)

## 2021-09-17 LAB — LIPID PANEL
Cholesterol: 172 mg/dL (ref 0–200)
HDL: 55.6 mg/dL (ref >39.00)
LDL Cholesterol: 83 mg/dL (ref 0–99)
NonHDL: 116.16
Total CHOL/HDL Ratio: 3
Triglycerides: 164 mg/dL — ABNORMAL HIGH (ref 0.0–149.0)
VLDL: 32.8 mg/dL (ref 0.0–40.0)

## 2021-09-17 LAB — MICROALBUMIN / CREATININE URINE RATIO
Creatinine,U: 85.8 mg/dL
Microalb Creat Ratio: 1.3 mg/g (ref 0.0–30.0)
Microalb, Ur: 1.1 mg/dL (ref 0.0–1.9)

## 2021-09-17 LAB — HEMOGLOBIN A1C: Hgb A1c MFr Bld: 6.4 % (ref 4.6–6.5)

## 2021-09-17 LAB — VITAMIN D 25 HYDROXY (VIT D DEFICIENCY, FRACTURES): VITD: 44.54 ng/mL (ref 30.00–100.00)

## 2021-09-24 ENCOUNTER — Encounter: Payer: Self-pay | Admitting: Family Medicine

## 2021-09-24 ENCOUNTER — Ambulatory Visit (INDEPENDENT_AMBULATORY_CARE_PROVIDER_SITE_OTHER): Payer: Medicare HMO | Admitting: Family Medicine

## 2021-09-24 VITALS — BP 132/88 | HR 75 | Temp 97.2°F | Ht 66.0 in | Wt 190.5 lb

## 2021-09-24 DIAGNOSIS — E1169 Type 2 diabetes mellitus with other specified complication: Secondary | ICD-10-CM

## 2021-09-24 DIAGNOSIS — E039 Hypothyroidism, unspecified: Secondary | ICD-10-CM | POA: Diagnosis not present

## 2021-09-24 DIAGNOSIS — E559 Vitamin D deficiency, unspecified: Secondary | ICD-10-CM

## 2021-09-24 DIAGNOSIS — Z23 Encounter for immunization: Secondary | ICD-10-CM

## 2021-09-24 DIAGNOSIS — Z1231 Encounter for screening mammogram for malignant neoplasm of breast: Secondary | ICD-10-CM | POA: Diagnosis not present

## 2021-09-24 DIAGNOSIS — I10 Essential (primary) hypertension: Secondary | ICD-10-CM

## 2021-09-24 DIAGNOSIS — Z0000 Encounter for general adult medical examination without abnormal findings: Secondary | ICD-10-CM

## 2021-09-24 DIAGNOSIS — Z6830 Body mass index (BMI) 30.0-30.9, adult: Secondary | ICD-10-CM | POA: Diagnosis not present

## 2021-09-24 DIAGNOSIS — E1165 Type 2 diabetes mellitus with hyperglycemia: Secondary | ICD-10-CM

## 2021-09-24 DIAGNOSIS — E785 Hyperlipidemia, unspecified: Secondary | ICD-10-CM | POA: Diagnosis not present

## 2021-09-24 DIAGNOSIS — M8580 Other specified disorders of bone density and structure, unspecified site: Secondary | ICD-10-CM | POA: Diagnosis not present

## 2021-09-24 DIAGNOSIS — F418 Other specified anxiety disorders: Secondary | ICD-10-CM | POA: Diagnosis not present

## 2021-09-24 DIAGNOSIS — M810 Age-related osteoporosis without current pathological fracture: Secondary | ICD-10-CM | POA: Insufficient documentation

## 2021-09-24 MED ORDER — ROSUVASTATIN CALCIUM 5 MG PO TABS: 5.0000 mg | ORAL_TABLET | ORAL | 3 refills | Status: DC

## 2021-09-24 MED ORDER — ESOMEPRAZOLE MAGNESIUM 20 MG PO CPDR: 20.0000 mg | DELAYED_RELEASE_CAPSULE | Freq: Every day | ORAL | 3 refills | Status: DC

## 2021-09-24 MED ORDER — HYDROCHLOROTHIAZIDE 12.5 MG PO CAPS: 12.5000 mg | ORAL_CAPSULE | Freq: Every day | ORAL | 3 refills | Status: DC

## 2021-09-24 MED ORDER — BUPROPION HCL ER (XL) 300 MG PO TB24: 300.0000 mg | ORAL_TABLET | Freq: Every day | ORAL | 3 refills | Status: DC

## 2021-09-24 MED ORDER — CARVEDILOL 6.25 MG PO TABS: 6.2500 mg | ORAL_TABLET | Freq: Two times a day (BID) | ORAL | 3 refills | Status: DC

## 2021-09-24 MED ORDER — METFORMIN HCL 1000 MG PO TABS: 1000.0000 mg | ORAL_TABLET | Freq: Two times a day (BID) | ORAL | 3 refills | Status: DC

## 2021-09-24 NOTE — Assessment & Plan Note (Signed)
Disc goals for lipids and reasons to control them Rev last labs with pt Rev low sat fat diet in detail LDL is 83 Goal below 70 She plans to try crestor 5 mg daily instead of intermittent and let us know how she tolerates

## 2021-09-24 NOTE — Assessment & Plan Note (Signed)
With h/o closed tib plateau fx  Doing well  Watched by ortho Taking vit D

## 2021-09-24 NOTE — Assessment & Plan Note (Signed)
Discussed how this problem influences overall health and the risks it imposes  Reviewed plan for weight loss with lower calorie diet (via better food choices and also portion control or program like weight watchers) and exercise building up to or more than 30 minutes 5 days per week including some aerobic activity   Enc further wt loss

## 2021-09-24 NOTE — Assessment & Plan Note (Signed)
Continues wellbutrin

## 2021-09-24 NOTE — Assessment & Plan Note (Signed)
Hypothyroidism  Pt has no clinical changes No change in energy level/ hair or skin/ edema and no tremor Lab Results  Component Value Date   TSH 6.41 (H) 09/17/2021    She decided to stop levothy 25 mcg since she feels fine  Subclinical Will continue to watch

## 2021-09-24 NOTE — Patient Instructions (Addendum)
You can try the crestor every day If you have side effects- back down to what you are taking   Flu shot today   Keep working on diet and exercise   Great job with weight loss    Please call to schedule your mammogram at the breast center  Please call the location of your choice from the menu below to schedule your Mammogram and/or Bone Density appointment.    [facility]                      Phone:  [phone], [place] [address]                                                             Services: Traditional and 3D Mammogram, [place] Bone Density                 Phone: [phone] [address], [place] [address]    Service: Bone Density ONLY   *this site does NOT perform mammograms  [facility]                        Phone:  [phone] [address], [place] [address]                                            Services:  3D Mammogram and [place] at [facility]   Phone:  [phone]   [place], [place] [address]                                            Services: 3D Mammogram and [place]  [place] at [facility])  Phone:  [phone]   [address] [place], [place] [address]                                              Services:  3D Mammogram and Bone Density

## 2021-09-24 NOTE — Assessment & Plan Note (Signed)
Level of 44.5 Vitamin D level is therapeutic with current supplementation Disc importance of this to bone and overall health

## 2021-09-24 NOTE — Assessment & Plan Note (Signed)
Lab Results  Component Value Date   HGBA1C 6.4 09/17/2021   Well controlled  Wt is down  Continues metformin 1000 mg bid  microalb utd  On statin  Intol of ace or arb Nl foot exam  Eye exams are frequent  F/u 6 mo

## 2021-09-24 NOTE — Assessment & Plan Note (Signed)
bp in fair control at this time  BP Readings from Last 1 Encounters:  09/24/21 132/88   No changes needed Most recent labs reviewed  Disc lifstyle change with low sodium diet and exercise  Plan to continue  Coreg 6.25 mg bid hctz 12.5 mg daily

## 2021-09-24 NOTE — Assessment & Plan Note (Signed)
Reviewed health habits including diet and exercise and skin cancer prevention Reviewed appropriate screening tests for age  Also reviewed health mt list, fam hx and immunization status , as well as social and family history   See HPI Labs reviewed Mammogram ordered-pt will schedule  Eye exam utd  Flu shot given  Colonoscopy utd from 09/2018 Enc vit D intake for bone health and osteopenia

## 2021-09-24 NOTE — Progress Notes (Signed)
Subjective:    Patient ID: [name], female    DOB: 1965/11/13, 56 y.o.   MRN: [id number]  HPI Here for health maintenance exam and to review chronic medical problems    Wt Readings from Last 3 Encounters:  09/24/21 190 lb 8 oz (86.4 kg)  09/01/21 206 lb (93.4 kg)  07/10/21 206 lb 5.6 oz (93.6 kg)   30.75 kg/m  Has lost some weight  Folks are bringing her food  Eating regularly  Healthier  PT- since 2 wk after surgery   Immunization History  Administered Date(s) Administered   Influenza Split 10/29/2010, 10/02/2011   Influenza Whole 10/13/2006, 10/03/2008, 10/09/2009   Influenza,inj,Quad PF,6+ Mos 09/14/2012, 10/11/2013, 09/12/2015, 11/02/2016, 09/17/2017, 09/14/2018, 10/09/2019   Influenza-Unspecified 09/18/2014   PFIZER(Purple Top)SARS-COV-2 Vaccination 09/08/2019, 09/29/2019, 03/20/2020   PPD Test 03/24/2012   Pneumococcal Polysaccharide-23 11/02/2012   Tdap 07/20/2011   Health Maintenance Due  Topic Date Due   HIV Screening  Never done   Hepatitis C Screening  Never done   Zoster Vaccines- Shingrix (1 of 2) Never done   COVID-19 Vaccine (4 - Pfizer risk series) 05/15/2020   FOOT EXAM  10/08/2020   MAMMOGRAM  12/26/2020   OPHTHALMOLOGY EXAM  05/31/2021   TETANUS/TDAP  07/19/2021   INFLUENZA VACCINE  08/05/2021   Mammogram 12/2019 - needs to schedule  Self breast exam   Eye exam  - every 3-4 months   Methotrexate for eye inflammation  Takes folic acid   Flu shot -today   Colonoscopy 09/2018   Had a tibial plateau fx and surgery  Putting weight on it for 3 wk Now problems with her foot  (swollen)   HTN bp is stable today  No cp or palpitations or headaches or edema  No side effects to medicines  BP Readings from Last 3 Encounters:  09/24/21 132/88  09/01/21 (!) 128/90  07/12/21 (!) 157/83     Coreg 6.25 mg bid Hctz 12.5 mg daily   Pulse Readings from Last 3 Encounters:  09/24/21 75  07/12/21 98  07/05/21 99     No ace or arb  due to cough   Hypothyroidism  Pt has no clinical changes No change in energy level/ hair or skin/ edema and no tremor Lab Results  Component Value Date   TSH 6.41 (H) 09/17/2021    Levothyroxine 25 mcg daily - is not taking  She feels fine without it    DM2 Lab Results  Component Value Date   HGBA1C 6.4 09/17/2021   Improved  Weight is down  Metformin 1000 mg bid  Taking statin   Metformin 1000 mg bid   Lab Results  Component Value Date   MICROALBUR 1.1 09/17/2021   MICROALBUR <0.7 10/02/2019     Hyperlipidemia  Lab Results  Component Value Date   CHOL 172 09/17/2021   CHOL 176 07/03/2020   CHOL 151 10/02/2019   Lab Results  Component Value Date   HDL 55.60 09/17/2021   HDL 52.40 07/03/2020   HDL 47.00 10/02/2019   Lab Results  Component Value Date   LDLCALC 83 09/17/2021   LDLCALC 93 07/03/2020   LDLCALC 79 10/02/2019   Lab Results  Component Value Date   TRIG 164.0 (H) 09/17/2021   TRIG 153.0 (H) 07/03/2020   TRIG 125.0 10/02/2019   Lab Results  Component Value Date   CHOLHDL 3 09/17/2021   CHOLHDL 3 07/03/2020   CHOLHDL 3 10/02/2019   No results found for: "  LDLDIRECT"  Crestor 5 mg three days weekly- can tolerate this   Vit D def Level 44.54   Osteopenia 09/2021   Lab Results  Component Value Date   WBC 8.1 09/17/2021   HGB 13.2 09/17/2021   HCT 40.7 09/17/2021   MCV 87.1 09/17/2021   PLT 281.0 09/17/2021   Lab Results  Component Value Date   CREATININE 0.80 09/17/2021   BUN 12 09/17/2021   NA 137 09/17/2021   K 4.4 09/17/2021   CL 101 09/17/2021   CO2 24 09/17/2021   Lab Results  Component Value Date   ALT 10 09/17/2021   AST 30 09/17/2021   ALKPHOS 102 09/17/2021   BILITOT 0.6 09/17/2021    Patient Active Problem List   Diagnosis Date Noted   Encounter for screening mammogram for breast cancer 09/24/2021   Osteopenia 09/24/2021   Estrogen deficiency 09/01/2021   History of DVT (deep vein thrombosis) 07/11/2021    Closed fracture of right tibial plateau 07/10/2021   Hordeolum 05/22/2021   Sleep apnea 05/14/2021   BMI 30.0-30.9,adult 07/10/2020   Hyperlipidemia associated with type 2 diabetes mellitus ([place]) 07/10/2019   Cystoid macular edema of both eyes 06/16/2019   Vitamin D deficiency 06/16/2019   Type 2 diabetes mellitus with hyperglycemia ([place]) 06/06/2019   Peripheral edema 10/30/2017   Moderate persistent asthma, uncomplicated 37/90/2409   Chronic rhinitis 08/10/2017   Cough 06/06/2017   Welcome to Medicare preventive visit 04/20/2017   Chronic low back pain 12/09/2015   Degeneration of lumbar or lumbosacral intervertebral disc 12/09/2015   Hypothyroid 01/24/2014   Pseudophakia of both eyes 12/15/2013   Nasal septal deviation 01/03/2013   Encounter for Medicare annual wellness exam 11/02/2012   Hydradenitis 11/02/2012   Cystoid macular degeneration of retina 05/13/2012   Macular hole 03/23/2012   Screening-pulmonary TB 03/23/2012   Routine general medical examination at a health care facility 10/02/2011   Iritis, recurrent 10/02/2011   Tachycardia 10/02/2011   ADD (attention deficit disorder) 08/20/2010   DYSHIDROTIC ECZEMA, HANDS 11/12/2009   Obstructive sleep apnea 11/19/2006   Depression with anxiety 09/29/2006   Essential hypertension 09/29/2006   Seasonal and perennial allergic rhinitis 09/29/2006   Asthma, mild intermittent, well-controlled 09/29/2006   GERD 09/29/2006   IBS 09/29/2006   ENDOMETRIOSIS 09/29/2006   FEMALE INFERTILITY 09/29/2006   Fibromyalgia 09/29/2006   INSOMNIA 09/29/2006   Past Medical History:  Diagnosis Date   ADHD (attention deficit hyperactivity disorder)    ADD - no meds   Allergy    allergic rhinitis   Anxiety    Asthma    Chronic headaches    Degenerative disc disease    Depression    Diabetes mellitus without complication ([place])    type 2   Eczema    no current problems as of 07/09/21   Fatty liver    Fibromyalgia    Fx of fibula  2009   GERD (gastroesophageal reflux disease)    Hemorrhoids    Hypertension    Hyperthyroidism    Hypothyroidism    IBS (irritable bowel syndrome)    Iritis, chronic    Macular hole of left eye 06/2017   PONV (postoperative nausea and vomiting)    Right leg DVT ([place])    Sleep apnea    uses CPAP nightly   Vitamin D insufficiency 06/16/2019   Past Surgical History:  Procedure Laterality Date   ABDOMINAL HYSTERECTOMY     ANTERIOR CRUCIATE LIGAMENT REPAIR Left 1996   small tear  of ACL   BREAST EXCISIONAL BIOPSY Left 1995   BREAST SURGERY     left nipple removed - benign tumor   BUNIONECTOMY Bilateral    COLONOSCOPY     EYE SURGERY     cataracts removed, macular hole repaired left eye- both surgeries done at [facility]  08/2004   endometriosis - several    ORIF TIBIA FRACTURE Right 07/10/2021   Procedure: OPEN REDUCTION INTERNAL FIXATION (ORIF) TIBIAL PLATEAU FRACTURE;  Surgeon: Altamese [place], MD;  Location: [facility];  Service: Orthopedics;  Laterality: Right;   WISDOM TOOTH EXTRACTION     Social History   Tobacco Use   Smoking status: Never   Smokeless tobacco: Never  Vaping Use   Vaping Use: Never used  Substance Use Topics   Alcohol use: Never    Alcohol/week: 0.0 standard drinks of alcohol   Drug use: No   Family History  Problem Relation Age of Onset   Hypertension Mother    Thyroid disease Mother    Fibromyalgia Mother    Colon polyps Mother    Diabetes Father    Kidney cancer Maternal Grandmother    Colon cancer Neg Hx    Esophageal cancer Neg Hx    Stomach cancer Neg Hx    Rectal cancer Neg Hx    Allergic rhinitis Neg Hx    Angioedema Neg Hx    Asthma Neg Hx    Atopy Neg Hx    Eczema Neg Hx    Immunodeficiency Neg Hx    Urticaria Neg Hx    Allergies  Allergen Reactions   Amlodipine     Pedal edema    Cetirizine Hcl Other (See Comments)    Not effective   Erythromycin Nausea Only and Other (See Comments)    Caused a stomach ache    Escitalopram Oxalate Other (See Comments)    "No improvement"   Lisinopril Cough   Loratadine Other (See Comments)    "Not effective"   Nifedipine Other (See Comments)    "Fatigue and tremors"   Sertraline Hcl Other (See Comments)    "Did not help"   Vyvanse [Lisdexamfetamine Dimesylate]     Headache and excitablility   Current Outpatient Medications on File Prior to Visit  Medication Sig Dispense Refill   acetaminophen (TYLENOL) 500 MG tablet Take 1 tablet (500 mg total) by mouth every 12 (twelve) hours. 60 tablet 0   ADVIL 200 MG CAPS Take 400 mg by mouth every 6 (six) hours as needed (for mild pain).     albuterol (PROVENTIL) (2.5 MG/3ML) 0.083% nebulizer solution Take 3 mLs (2.5 mg total) by nebulization every 4 (four) hours as needed for wheezing or shortness of breath. 75 mL 1   albuterol (VENTOLIN HFA) 108 (90 Base) MCG/ACT inhaler Inhale 2 puffs into the lungs every 6 (six) hours as needed for wheezing or shortness of breath. 18 g 1   BD VEO INSULIN SYRINGE U/F 31G X 15/64" 1 ML MISC      cetirizine (ZYRTEC) 10 MG tablet Take 10 mg by mouth daily as needed for allergies or rhinitis.     Cholecalciferol (VITAMIN D3 PO) Take 1 capsule by mouth daily.     cyclobenzaprine (FLEXERIL) 10 MG tablet Take 1 tablet (10 mg total) by mouth 3 (three) times daily as needed for muscle spasms. Caution of sedation 30 tablet 0   diphenhydrAMINE (BENADRYL) 25 mg capsule Take 25-50 mg by mouth every 6 (six) hours as needed for  allergies.     docusate sodium (COLACE) 100 MG capsule Take 1 capsule (100 mg total) by mouth 2 (two) times daily. 30 capsule 0   folic acid (FOLVITE) 1 MG tablet Take 3 mg by mouth See admin instructions. Take 3 mg by mouth once a day on Wed/Thurs/Fri/Sat     glucose blood (ONETOUCH VERIO) test strip To check glucose daily and prn for diabetes type 2 100 each 3   HUMIRA PEN 40 MG/0.8ML PNKT Inject 40 mg into the skin every Monday.     Insulin Syringe-Needle U-100 (INSULIN  SYRINGE 1CC/31GX5/16") 31G X 5/16" 1 ML MISC See admin instructions.     ketorolac (ACULAR) 0.5 % ophthalmic solution Place 1 drop into the left eye 2 (two) times daily.     Lancets (ONETOUCH DELICA PLUS [id number]) MISC SMARTSIG:1 Lancet(s) Topical Twice Daily PRN     leucovorin (WELLCOVORIN) 5 MG tablet Take 5 mg by mouth See admin instructions. Take 5 mg by mouth once a day on only Sundays and Tuesdays     magnesium oxide (MAG-OX) 400 MG tablet Take 400 mg by mouth daily.     methocarbamol (ROBAXIN) 500 MG tablet Take 1-2 tablets (500-1,000 mg total) by mouth every 8 (eight) hours as needed for muscle spasms. Caution of sedation 60 tablet 0   Methotrexate 25 MG/ML SOSY Inject 17.5 mg into the skin every Monday.     ondansetron (ZOFRAN-ODT) 4 MG disintegrating tablet Take 1 tablet (4 mg total) by mouth every 8 (eight) hours as needed for nausea or vomiting. 8 tablet 0   oxyCODONE-acetaminophen (PERCOCET/ROXICET) 5-325 MG tablet Take 1 tablet by mouth every 4 (four) hours as needed. (Patient taking differently: Take 1 tablet by mouth every 4 (four) hours as needed (for pain).) 15 tablet 0   prednisoLONE acetate (PRED FORTE) 1 % ophthalmic suspension Place 4 drops into the left eye 2 (two) times daily.     RIVAROXABAN (XARELTO) VTE STARTER PACK (15 & 20 MG) Follow package directions: Take one '15mg'$  tablet by mouth twice a day. On day 22, switch to one '20mg'$  tablet once a day. Take with food. (Patient taking differently: Take 15-20 mg by mouth See admin instructions. Follow package directions: Take one 15 mg tablet by mouth twice a day. On day 22, switch to one '20mg'$  tablet once a day. Take with food.) 51 each 0   SYMBICORT 160-4.5 MCG/ACT inhaler Inhale 2 puffs into the lungs 2 (two) times daily. (Patient taking differently: Inhale 2 puffs into the lungs 2 (two) times daily as needed (for respiratory flares).) 10.2 g 5   traMADol (ULTRAM) 50 MG tablet Take 50 mg by mouth every 6 (six) hours as needed (for  fibromyalgia flares).     Vitamin D, Ergocalciferol, (DRISDOL) 1.25 MG (50000 UNIT) CAPS capsule Take 1 capsule (50,000 Units total) by mouth every 7 (seven) days. Take for 8 total doses(weeks) 8 capsule 0   No current facility-administered medications on file prior to visit.     Review of Systems  Constitutional:  Positive for fatigue. Negative for activity change, appetite change, fever and unexpected weight change.  HENT:  Negative for congestion, ear pain, rhinorrhea, sinus pressure and sore throat.   Eyes:  Negative for pain, redness and visual disturbance.  Respiratory:  Negative for cough, shortness of breath and wheezing.   Cardiovascular:  Negative for chest pain and palpitations.  Gastrointestinal:  Negative for abdominal pain, blood in stool, constipation and diarrhea.  Endocrine: Negative for polydipsia  and polyuria.  Genitourinary:  Negative for dysuria, frequency and urgency.  Musculoskeletal:  Positive for arthralgias and myalgias. Negative for back pain.  Skin:  Negative for pallor and rash.  Allergic/Immunologic: Negative for environmental allergies.  Neurological:  Negative for dizziness, syncope and headaches.  Hematological:  Negative for adenopathy. Does not bruise/bleed easily.  Psychiatric/Behavioral:  Negative for decreased concentration and dysphoric mood. The patient is not nervous/anxious.        Objective:   Physical Exam Constitutional:      General: She is not in acute distress.    Appearance: Normal appearance. She is well-developed. She is obese. She is not ill-appearing or diaphoretic.  HENT:     Head: Normocephalic and atraumatic.     Right Ear: Tympanic membrane, ear canal and external ear normal.     Left Ear: Tympanic membrane, ear canal and external ear normal.     Nose: Nose normal. No congestion.     Mouth/Throat:     Mouth: Mucous membranes are moist.     Pharynx: Oropharynx is clear. No posterior oropharyngeal erythema.  Eyes:      General: No scleral icterus.    Extraocular Movements: Extraocular movements intact.     Conjunctiva/sclera: Conjunctivae normal.     Pupils: Pupils are equal, round, and reactive to light.  Neck:     Thyroid: No thyromegaly.     Vascular: No carotid bruit or JVD.  Cardiovascular:     Rate and Rhythm: Normal rate and regular rhythm.     Pulses: Normal pulses.     Heart sounds: Normal heart sounds.     No gallop.  Pulmonary:     Effort: Pulmonary effort is normal. No respiratory distress.     Breath sounds: Normal breath sounds. No wheezing.     Comments: Good air exch Chest:     Chest wall: No tenderness.  Abdominal:     General: Bowel sounds are normal. There is no distension or abdominal bruit.     Palpations: Abdomen is soft. There is no mass.     Tenderness: There is no abdominal tenderness.     Hernia: No hernia is present.  Genitourinary:    Comments: Breast exam: No mass, nodules, thickening, tenderness, bulging, retraction, inflamation, nipple discharge or skin changes noted.  No axillary or clavicular LA.     Musculoskeletal:        General: No tenderness. Normal range of motion.     Cervical back: Normal range of motion and neck supple. No rigidity. No muscular tenderness.     Right lower leg: No edema.     Left lower leg: No edema.     Comments: No kyphosis   Lymphadenopathy:     Cervical: No cervical adenopathy.  Skin:    General: Skin is warm and dry.     Coloration: Skin is not pale.     Findings: No erythema or rash.     Comments: Solar lentigines diffusely   Neurological:     Mental Status: She is alert. Mental status is at baseline.     Cranial Nerves: No cranial nerve deficit.     Motor: No abnormal muscle tone.     Coordination: Coordination normal.     Gait: Gait normal.     Deep Tendon Reflexes: Reflexes are normal and symmetric. Reflexes normal.  Psychiatric:        Mood and Affect: Mood normal.        Cognition and Memory: Cognition and  memory  normal.           Assessment & Plan:   Problem List Items Addressed This Visit       Cardiovascular and Mediastinum   Essential hypertension    bp in fair control at this time  BP Readings from Last 1 Encounters:  09/24/21 132/88  No changes needed Most recent labs reviewed  Disc lifstyle change with low sodium diet and exercise  Plan to continue  Coreg 6.25 mg bid hctz 12.5 mg daily         Relevant Medications   carvedilol (COREG) 6.25 MG tablet   rosuvastatin (CRESTOR) 5 MG tablet   hydrochlorothiazide (MICROZIDE) 12.5 MG capsule     Endocrine   Hyperlipidemia associated with type 2 diabetes mellitus ([place])    Disc goals for lipids and reasons to control them Rev last labs with pt Rev low sat fat diet in detail LDL is 83 Goal below 70 She plans to try crestor 5 mg daily instead of intermittent and let us know how she tolerates        Relevant Medications   carvedilol (COREG) 6.25 MG tablet   metFORMIN (GLUCOPHAGE) 1000 MG tablet   rosuvastatin (CRESTOR) 5 MG tablet   hydrochlorothiazide (MICROZIDE) 12.5 MG capsule   Hypothyroid    Hypothyroidism  Pt has no clinical changes No change in energy level/ hair or skin/ edema and no tremor Lab Results  Component Value Date   TSH 6.41 (H) 09/17/2021    She decided to stop levothy 25 mcg since she feels fine  Subclinical Will continue to watch      Relevant Medications   carvedilol (COREG) 6.25 MG tablet   Type 2 diabetes mellitus with hyperglycemia ([facility])    Lab Results  Component Value Date   HGBA1C 6.4 09/17/2021  Well controlled  Wt is down  Continues metformin 1000 mg bid  microalb utd  On statin  Intol of ace or arb Nl foot exam  Eye exams are frequent  F/u 6 mo      Relevant Medications   metFORMIN (GLUCOPHAGE) 1000 MG tablet   rosuvastatin (CRESTOR) 5 MG tablet     Musculoskeletal and Integument   Osteopenia    With h/o closed tib plateau fx  Doing well  Watched by ortho Taking  vit D        Other   BMI 30.0-30.9,adult    Discussed how this problem influences overall health and the risks it imposes  Reviewed plan for weight loss with lower calorie diet (via better food choices and also portion control or program like weight watchers) and exercise building up to or more than 30 minutes 5 days per week including some aerobic activity   Enc further wt loss       Depression with anxiety    Continues wellbutrin       Relevant Medications   buPROPion (WELLBUTRIN XL) 300 MG 24 hr tablet   Encounter for screening mammogram for breast cancer   Relevant Medications   hydrochlorothiazide (MICROZIDE) 12.5 MG capsule   Other Relevant Orders   MM 3D SCREEN BREAST BILATERAL   Routine general medical examination at a health care facility - Primary    Reviewed health habits including diet and exercise and skin cancer prevention Reviewed appropriate screening tests for age  Also reviewed health mt list, fam hx and immunization status , as well as social and family history   See HPI Labs reviewed Mammogram ordered-pt will  schedule  Eye exam utd  Flu shot given  Colonoscopy utd from 09/2018 Enc vit D intake for bone health and osteopenia       Relevant Orders   Flu Vaccine QUAD 6+ mos PF IM (Fluarix Quad PF) (Completed)   Vitamin D deficiency    Level of 44.5 Vitamin D level is therapeutic with current supplementation Disc importance of this to bone and overall health       Other Visit Diagnoses     Need for influenza vaccination       Relevant Orders   Flu Vaccine QUAD 6+ mos PF IM (Fluarix Quad PF) (Completed)

## 2021-09-29 ENCOUNTER — Ambulatory Visit (INDEPENDENT_AMBULATORY_CARE_PROVIDER_SITE_OTHER): Payer: Medicare HMO | Admitting: Family Medicine

## 2021-09-29 ENCOUNTER — Encounter: Payer: Self-pay | Admitting: Family Medicine

## 2021-09-29 VITALS — BP 160/112 | Ht 66.0 in | Wt 190.0 lb

## 2021-09-29 DIAGNOSIS — M8000XD Age-related osteoporosis with current pathological fracture, unspecified site, subsequent encounter for fracture with routine healing: Secondary | ICD-10-CM

## 2021-09-29 NOTE — Patient Instructions (Signed)
Good to see you We'll run on the benefits for prolia  Please send me a message in MyChart with any questions or updates.  We'll will give you a call about the benefits.   --Dr. Raeford Razor

## 2021-09-29 NOTE — Assessment & Plan Note (Signed)
Her fracture of low trauma qualifies for her osteoporosis with a T score of -2.4 -Counseled on home exercise therapy and supportive care. -Check benefits for Prolia.

## 2021-09-29 NOTE — Progress Notes (Signed)
  [name] - 57 y.o. female MRN [id number]  Date of birth: 10-10-1965  SUBJECTIVE:  Including CC & ROS.  No chief complaint on file.   [name] is a 56 y.o. female that is following up for her osteoporosis.  Her bone density was demonstrating a T score of -2.4 with a history of a low trauma fracture.  She was found to have a vitamin D on the low end of normal.  She is currently in physical therapy for her right knee.   Review of Systems See HPI   HISTORY: Past Medical, Surgical, Social, and Family History Reviewed & Updated per EMR.   Pertinent Historical Findings include:  Past Medical History:  Diagnosis Date   ADHD (attention deficit hyperactivity disorder)    ADD - no meds   Allergy    allergic rhinitis   Anxiety    Asthma    Chronic headaches    Degenerative disc disease    Depression    Diabetes mellitus without complication ([place])    type 2   Eczema    no current problems as of 07/09/21   Fatty liver    Fibromyalgia    Fx of fibula 2009   GERD (gastroesophageal reflux disease)    Hemorrhoids    Hypertension    Hyperthyroidism    Hypothyroidism    IBS (irritable bowel syndrome)    Iritis, chronic    Macular hole of left eye 06/2017   PONV (postoperative nausea and vomiting)    Right leg DVT ([place])    Sleep apnea    uses CPAP nightly   Vitamin D insufficiency 06/16/2019    Past Surgical History:  Procedure Laterality Date   ABDOMINAL HYSTERECTOMY     ANTERIOR CRUCIATE LIGAMENT REPAIR Left 1996   small tear of ACL   BREAST EXCISIONAL BIOPSY Left 1995   BREAST SURGERY     left nipple removed - benign tumor   BUNIONECTOMY Bilateral    COLONOSCOPY     EYE SURGERY     cataracts removed, macular hole repaired left eye- both surgeries done at [facility]  08/2004   endometriosis - several    ORIF TIBIA FRACTURE Right 07/10/2021   Procedure: OPEN REDUCTION INTERNAL FIXATION (ORIF) TIBIAL PLATEAU FRACTURE;  Surgeon: Altamese [place], MD;  Location:  [facility];  Service: Orthopedics;  Laterality: Right;   WISDOM TOOTH EXTRACTION       PHYSICAL EXAM:  VS: BP (!) 160/112 (BP Location: Left Arm, Patient Position: Sitting)   Ht '5\' 6"'$  (1.676 m)   Wt 190 lb (86.2 kg)   LMP 08/05/2006   BMI 30.67 kg/m  Physical Exam Gen: NAD, alert, cooperative with exam, well-appearing MSK:  Neurovascularly intact       ASSESSMENT & PLAN:   Osteoporosis Her fracture of low trauma qualifies for her osteoporosis with a T score of -2.4 -Counseled on home exercise therapy and supportive care. -Check benefits for Prolia.

## 2021-10-10 ENCOUNTER — Telehealth: Payer: Self-pay

## 2021-10-10 NOTE — Telephone Encounter (Signed)
Jasper Loser, CMA  Jasper Loser, CMA Rosemarie Ax, MD  Jasper Loser, CMA  Check benefits for prolia

## 2021-10-10 NOTE — Telephone Encounter (Signed)
Prolia VOB initiated via [email or web address] 

## 2021-10-15 NOTE — Telephone Encounter (Signed)
Prior auth required for PROLIA  PA PROCESS DETAILS: Precertification is required. Call [phone] or complete the Precertification form available at [email or web address] 

## 2021-10-26 NOTE — Telephone Encounter (Signed)
Prior Authorization initiated for [facility] via [employer] Case ID: [id number]  APPROVED PA# [id number] Valid: 10/26/21-10/26/22

## 2021-10-26 NOTE — Telephone Encounter (Signed)
Pt ready for scheduling on or after 10/26/21  Out-of-pocket cost due at time of visit: $301  Primary: [employer] Medicare Adv Prolia co-insurance: 20% (approximately $276) Admin fee co-insurance: 20% (approximately $25)  Secondary: n/a Prolia co-insurance:  Admin fee co-insurance:   Deductible: does not apply  Prior Auth: APPROVED PA# [id number] Valid: 10/26/21-10/26/22  ** This summary of benefits is an estimation of the patient's out-of-pocket cost. Exact cost may vary based on individual plan coverage.

## 2021-10-29 NOTE — Telephone Encounter (Signed)
Left message for patient to call back  

## 2021-10-30 ENCOUNTER — Other Ambulatory Visit: Payer: Self-pay | Admitting: Allergy & Immunology

## 2021-11-12 ENCOUNTER — Ambulatory Visit (INDEPENDENT_AMBULATORY_CARE_PROVIDER_SITE_OTHER): Payer: Medicare HMO | Admitting: Nurse Practitioner

## 2021-11-12 ENCOUNTER — Encounter: Payer: Self-pay | Admitting: Nurse Practitioner

## 2021-11-12 VITALS — BP 154/96 | HR 94 | Temp 98.0°F | Resp 12 | Wt 191.4 lb

## 2021-11-12 DIAGNOSIS — H6501 Acute serous otitis media, right ear: Secondary | ICD-10-CM | POA: Diagnosis not present

## 2021-11-12 DIAGNOSIS — J0100 Acute maxillary sinusitis, unspecified: Secondary | ICD-10-CM | POA: Diagnosis not present

## 2021-11-12 DIAGNOSIS — R051 Acute cough: Secondary | ICD-10-CM

## 2021-11-12 LAB — POC COVID19 BINAXNOW: SARS Coronavirus 2 Ag: NEGATIVE

## 2021-11-12 MED ORDER — AMOXICILLIN-POT CLAVULANATE 875-125 MG PO TABS: 1.0000 | ORAL_TABLET | Freq: Two times a day (BID) | ORAL | 0 refills | Status: AC

## 2021-11-12 NOTE — Progress Notes (Signed)
Acute Office Visit  Subjective:     Patient ID: [name], female    DOB: 05/31/1965, 56 y.o.   MRN: [id number]  Chief Complaint  Patient presents with   Cough    Sx started on 11/08/21-coughing up green phlegm, feels malaise, sinus pain, runny nose, scratchy throat. No fever. Has taking Delsym, Mucinex    Cough Associated symptoms include ear pain, headaches (on sunday and since then ok) and a sore throat (improved to scratchy). Pertinent negatives include no chills, fever, myalgias or shortness of breath.   Patient is in today for cough  Sympotms started approx 4 days Daughter and boyfriend had resp issues No covid test at home Covid vaccines  Flu up to date Mucinex and Delsym cough syrup and nyquill at night. States that the medication helps some.   Review of Systems  Constitutional:  Positive for malaise/fatigue. Negative for chills and fever.       Appetite decreased Fluid intake normal   HENT:  Positive for ear pain, sinus pain and sore throat (improved to scratchy). Negative for ear discharge.   Respiratory:  Positive for cough and sputum production (green). Negative for shortness of breath.   Musculoskeletal:  Negative for joint pain and myalgias.  Neurological:  Positive for headaches (on sunday and since then ok).        Objective:    BP (!) 154/96   Pulse 94   Temp 98 F (36.7 C)   Resp 12   Wt 191 lb 6 oz (86.8 kg)   LMP 08/05/2006   SpO2 97%   BMI 30.89 kg/m    Physical Exam Vitals and nursing note reviewed.  Constitutional:      Appearance: Normal appearance.  HENT:     Right Ear: Ear canal and external ear normal. Tympanic membrane is erythematous.     Left Ear: Tympanic membrane, ear canal and external ear normal.     Nose:     Right Sinus: Maxillary sinus tenderness present.     Left Sinus: Maxillary sinus tenderness present.     Mouth/Throat:     Mouth: Mucous membranes are moist.     Pharynx: Oropharynx is clear.   Cardiovascular:     Rate and Rhythm: Normal rate and regular rhythm.     Heart sounds: Normal heart sounds.  Pulmonary:     Effort: Pulmonary effort is normal.     Breath sounds: Normal breath sounds.  Lymphadenopathy:     Cervical: No cervical adenopathy.  Neurological:     Mental Status: She is alert.     Results for orders placed or performed in visit on 11/12/21  POC COVID-19  Result Value Ref Range   SARS Coronavirus 2 Ag Negative Negative        Assessment & Plan:   Problem List Items Addressed This Visit       Respiratory   Acute non-recurrent maxillary sinusitis    Will cover with Augmentin 875-120 mg twice daily for 7 days.  Patient has not taken methotrexate this week to reach out to rheumatology prior to starting methotrexate as it does have interaction with Augmentin.  Patient acknowledged      Relevant Medications   amoxicillin-clavulanate (AUGMENTIN) 875-125 MG tablet     Nervous and Auditory   Non-recurrent acute serous otitis media of right ear    Beginnings of otitis media on the right side Augmentin for coverage.  Follow-up if no improvement      Relevant  Medications   amoxicillin-clavulanate (AUGMENTIN) 875-125 MG tablet     Other   Cough - Primary    COVID test was negative in office.  Patient continues over-the-counter medications such as Mucinex and Delsym as needed.  Offered to send in prescription cough medication patient politely declined.      Relevant Orders   POC COVID-19 (Completed)    Meds ordered this encounter  Medications   amoxicillin-clavulanate (AUGMENTIN) 875-125 MG tablet    Sig: Take 1 tablet by mouth 2 (two) times daily for 7 days.    Dispense:  14 tablet    Refill:  0    Order Specific Question:   Supervising Provider    Answer:   TOWER, MARNE A [[id number]    Return if symptoms worsen or fail to improve.  Romilda Garret, NP

## 2021-11-12 NOTE — Assessment & Plan Note (Signed)
Will cover with Augmentin 875-120 mg twice daily for 7 days.  Patient has not taken methotrexate this week to reach out to rheumatology prior to starting methotrexate as it does have interaction with Augmentin.  Patient acknowledged

## 2021-11-12 NOTE — Patient Instructions (Addendum)
Nice to see you today Your covid test was negative in office I am going to send in an antibiotic to cover the ear and sinuses.  Follow up if you do not improve or get worse.   Check with rheumatology prior to taking the methotrexate when using the antibiotic

## 2021-11-12 NOTE — Assessment & Plan Note (Signed)
COVID test was negative in office.  Patient continues over-the-counter medications such as Mucinex and Delsym as needed.  Offered to send in prescription cough medication patient politely declined.

## 2021-11-12 NOTE — Assessment & Plan Note (Signed)
Beginnings of otitis media on the right side Augmentin for coverage.  Follow-up if no improvement

## 2021-11-13 ENCOUNTER — Telehealth: Payer: Self-pay | Admitting: Family Medicine

## 2021-11-13 DIAGNOSIS — B379 Candidiasis, unspecified: Secondary | ICD-10-CM

## 2021-11-13 MED ORDER — FLUCONAZOLE 150 MG PO TABS: 150.0000 mg | ORAL_TABLET | Freq: Once | ORAL | 0 refills | Status: AC

## 2021-11-13 NOTE — Telephone Encounter (Signed)
Patient called and stated she forgot to ask for a prescription for yeast infection yesterday at her appointment.

## 2021-11-13 NOTE — Telephone Encounter (Signed)
Medication sent in. 

## 2021-11-14 NOTE — Telephone Encounter (Signed)
Left message for patient to call back  

## 2021-11-15 NOTE — Progress Notes (Signed)
Patient ID: [name], female    DOB: [birth year], 56 y.o.   MRN: [id number]  HPI F followed for obstructive sleep apnea and allergic rhinitis, asthma, complicated by insomnia, GERD, depression. NPSG 10/08/06 AHI 33/hr.  ------------------------------------------   09/02/20- 56 year old female never smoker followed for OSA, complicated by Allergic rhinitis, Asthma, insomnia, GERD, depression, DM2,  chronic iritis/[facility]/methotrexate/ Humira,  -Albuterol hfa, Symbicort 160, Neb albuterol, MTX,  CPAP auto 5-15/Adapt                 Machine replaced in 2019 Download- compliance 100%, AHI 1/hr Body weight today-207 lbs Covid vax-3 Ph[employer] Followed by Asthma and Allergy/ Dr Ernst Bowler -----OSA, using cpap, denies problems Doing well with CPAP. Office Spirometry from her Allergist- mild resstriction of exhaled volume. CXR 05/13/20 IMPRESSION: Minimal chronic bronchitic changes without infiltrate.  11/17/21- 56 year old female never smoker followed for OSA, complicated by Allergic rhinitis, Asthma, insomnia, GERD, depression, DM2,  chronic iritis/[facility]/methotrexate/ Humira,  -Albuterol hfa, Symbicort 160, Neb albuterol, MTX,  CPAP auto 5-15/Adapt                 Machine replaced in 2019 Download- compliance 100%, AHI 1.2/ hr Body weight today 190 lbs Covid vax-3 Phizer Flu vax- had -----No issues with cpap Download reviewed. Sleep is good. On augmentin now for sinusitis. Asthma is managed by her Allergist. Broke R tibia - still doing PT.  Review of Systems- see HPI   + = positive Constitutional:   No-   weight loss, night sweats, fevers, chills, fatigue, lassitude. HEENT:   No-  headaches, difficulty swallowing, tooth/dental problems, sore throat,       No- sneezing, itching, ear ache, +nasal congestion, little post nasal drip,  CV:  No-   chest pain, orthopnea, PND, swelling in lower extremities, anasarca, dizziness, palpitations Resp: No-   shortness of breath with exertion or at  rest.              No-   productive cough, + non-productive cough,  No- coughing up of blood.              No-   change in color of mucus.  No- wheezing.   Skin: No-   rash or lesions. GI:  No-   heartburn, indigestion, abdominal pain, nausea, vomiting,  GU: MS:   Neuro-     nothing unusual Psych:  No- change in mood or affect. No depression or anxiety.  No memory loss.  Objective:   Physical Exam General- Alert, Oriented, Affect-appropriate, Distress- none acute, + Overweight Skin- +keratosis pilaris Lymphadenopathy- none Head- atraumatic            Eyes- Gross vision intact, PERRLA, conjunctivae clear secretions            Ears- Hearing, canals-normal            Nose- mucosa + clear, turbinate edema, +Septal dev,no- polyps, erosion, perforation             Throat- Mallampati II-III , mucosa clear , drainage- none, tonsils- atrophic Neck- flexible , trachea midline, no stridor , thyroid nl, carotid no bruit Chest - symmetrical excursion , unlabored           Heart/CV- RRR , no murmur , no gallop  , no rub, nl s1 s2                           - JVD- none , edema-  none, stasis changes- none, varices- none           Lung- clear to P&A, wheeze- none, cough- none  , dullness-none, rub- none           Chest wall-  Abd-  Br/ Gen/ Rectal- Not done, not indicated Extrem- +healed incision scar R lower leg Neuro- grossly intact to observation

## 2021-11-17 ENCOUNTER — Encounter: Payer: Self-pay | Admitting: Internal Medicine

## 2021-11-17 ENCOUNTER — Ambulatory Visit: Payer: Medicare HMO | Admitting: Internal Medicine

## 2021-11-17 VITALS — BP 128/88 | HR 107 | Ht 68.0 in | Wt 190.0 lb

## 2021-11-17 DIAGNOSIS — G4733 Obstructive sleep apnea (adult) (pediatric): Secondary | ICD-10-CM

## 2021-11-17 DIAGNOSIS — J0100 Acute maxillary sinusitis, unspecified: Secondary | ICD-10-CM | POA: Diagnosis not present

## 2021-11-17 NOTE — Telephone Encounter (Signed)
Pt informed of below.  She declines to start Prolia at this time due to cost. She is requesting to know if she can take something by mouth for $$ savings. Please advise.

## 2021-11-17 NOTE — Patient Instructions (Signed)
We can continue CPAP auto 5-15  Hope you feel well through the holidays- let us know if we can help

## 2021-11-19 ENCOUNTER — Ambulatory Visit: Payer: Medicare HMO | Admitting: Family Medicine

## 2021-11-19 ENCOUNTER — Encounter: Payer: Self-pay | Admitting: Family Medicine

## 2021-11-19 VITALS — BP 168/100 | HR 97 | Temp 97.6°F | Resp 18 | Ht 67.5 in | Wt 192.4 lb

## 2021-11-19 DIAGNOSIS — J310 Chronic rhinitis: Secondary | ICD-10-CM

## 2021-11-19 DIAGNOSIS — D849 Immunodeficiency, unspecified: Secondary | ICD-10-CM | POA: Diagnosis not present

## 2021-11-19 DIAGNOSIS — J4540 Moderate persistent asthma, uncomplicated: Secondary | ICD-10-CM | POA: Diagnosis not present

## 2021-11-19 MED ORDER — DULERA 100-5 MCG/ACT IN AERO: 2.0000 | INHALATION_SPRAY | Freq: Two times a day (BID) | RESPIRATORY_TRACT | 5 refills | Status: DC

## 2021-11-19 NOTE — Patient Instructions (Addendum)
Asthma Continue albuterol 2 puffs once every 4 hours as needed for cough or wheeze You may use albuterol 2 puffs 5-15 minuets before activity to decrease cough or wheeze For asthma flare, begin Dulera 100-2 puffs twice a day for 2 weeks or until cough or wheeze  Chronic rhinitis Continue an over the counter antihistamine once a day as needed for a runny nose or itch Continue Flonase 2 sprays in each nostril once a day as needed for a stuffy nose.  In the right nostril, point the applicator out toward the right ear. In the left nostril, point the applicator out toward the left ear Consider saline nasal rinses as needed for nasal symptoms. Use this before any medicated nasal sprays for best result  Call the clinic if this treatment plan is not working well for you  Follow up in 6 months or sooner if needed.

## 2021-11-19 NOTE — Progress Notes (Signed)
[address] [place] [address] Dept: [phone]  FOLLOW UP NOTE  Patient ID: [name], female    DOB: 01/29/1965  Age: 56 y.o. MRN: [id number] Date of Office Visit: 11/19/2021  Assessment  Chief Complaint: Asthma (Has used nebulizer about 4/5x since her last visit. Has used albuterol sporadically with inhaler )  HPI [name] is a 56 year old female who presents to the clinic for follow-up visit.  She was last seen in this clinic on on 02/26/2021 by Dr. Ernst Bowler for evaluation of asthma and chronic rhinitis.  Her current medical problem list includes obstructive sleep apnea with CPAP use, iritis, and uveitis.  At today's visit, she reports that she is getting over a sinus infection requiring Augmentin and has been experiencing mild cough with productive sputum which began as a green color and is now thinner and clear.  Otherwise, she reports her asthma has been well controlled with no shortness of breath, cough, or wheeze with activity or rest.  She reports that she has used her Symbicort about 10 times over the last year and uses albuterol infrequently as well with relief of symptoms. Chart review reveals her last chest xray was on 05/13/2020 and showed "minimal chronic bronchitic changes without infiltrate". Allergic rhinitis is reported as moderately well controlled with occasional sneeze as the main symptom.  She does report that she is recently had a sinus infection which occurred after exposure to a viral illness.  She continues an antihistamine once a day as needed and occasionally uses Flonase and nasal saline rinses. She has previously been on 2 separate rounds of immunotherapy directed toward pollens, mold, cat, and dust mites.  She continues to utilize CPAP for control of obstructive sleep apnea without issue.  She continues to follow up with [facility] where she continues methotrexate and Humira for control of uveitis and iritis.  Her  current medications are listed in the chart.   Drug Allergies:  Allergies  Allergen Reactions   Amlodipine     Pedal edema    Cetirizine Hcl Other (See Comments)    Not effective   Erythromycin Nausea Only and Other (See Comments)    Caused a stomach ache   Escitalopram Oxalate Other (See Comments)    "No improvement"   Lisinopril Cough   Loratadine Other (See Comments)    "Not effective"   Nifedipine Other (See Comments)    "Fatigue and tremors"   Sertraline Hcl Other (See Comments)    "Did not help"   Vyvanse [Lisdexamfetamine Dimesylate]     Headache and excitablility    Physical Exam: BP (!) 168/100   Pulse 97   Temp 97.6 F (36.4 C)   Resp 18   Ht 5' 7.5" (1.715 m)   Wt 192 lb 6 oz (87.3 kg)   LMP 08/05/2006   SpO2 97%   BMI 29.69 kg/m    Physical Exam Vitals reviewed.  Constitutional:      Appearance: Normal appearance.  HENT:     Head: Normocephalic and atraumatic.     Right Ear: Tympanic membrane normal.     Left Ear: Tympanic membrane normal.     Nose:     Comments: Bilateral nares slightly erythematous with clear nasal drainage noted.  Pharynx normal.  Ears normal.  Eyes normal.    Mouth/Throat:     Pharynx: Oropharynx is clear.  Eyes:     Conjunctiva/sclera: Conjunctivae normal.  Cardiovascular:  Rate and Rhythm: Normal rate and regular rhythm.     Heart sounds: Normal heart sounds. No murmur heard. Pulmonary:     Effort: Pulmonary effort is normal.     Breath sounds: Normal breath sounds.     Comments: Lungs clear to auscultation Musculoskeletal:     Cervical back: Normal range of motion and neck supple.  Neurological:     Mental Status: She is alert.     Diagnostics: FVC 2.84, FEV1 2.15.  Predicted FVC 3.82, predicted FEV1 3.02.  Spirometry indicates mild restriction.  This is consistent with previous spirometry readings.  Assessment and Plan: 1. Moderate persistent asthma, uncomplicated   2. Chronic rhinitis   3.  Immunosuppressed status ([place])     Meds ordered this encounter  Medications   mometasone-formoterol (DULERA) 100-5 MCG/ACT AERO    Sig: Inhale 2 puffs into the lungs 2 (two) times daily.    Dispense:  1 each    Refill:  5    Patient Instructions  Asthma Continue albuterol 2 puffs once every 4 hours as needed for cough or wheeze You may use albuterol 2 puffs 5-15 minuets before activity to decrease cough or wheeze For asthma flare, begin Dulera 100-2 puffs twice a day for 2 weeks or until cough or wheeze  Chronic rhinitis Continue an over the counter antihistamine once a day as needed for a runny nose or itch Continue Flonase 2 sprays in each nostril once a day as needed for a stuffy nose.  In the right nostril, point the applicator out toward the right ear. In the left nostril, point the applicator out toward the left ear Consider saline nasal rinses as needed for nasal symptoms. Use this before any medicated nasal sprays for best result  Call the clinic if this treatment plan is not working well for you  Follow up in 6 months or sooner if needed.   Return in about 6 months (around 05/20/2022), or if symptoms worsen or fail to improve.    Thank you for the opportunity to care for this patient.  Please do not hesitate to contact me with questions.  Gareth Morgan, FNP [facility]

## 2021-11-25 NOTE — Addendum Note (Signed)
Addended by: Norville Haggard on: 11/25/2021 01:11 PM   Modules accepted: Orders

## 2021-11-26 NOTE — Telephone Encounter (Signed)
Pt archived in [email or web address].  Please advise if patient and/or provider wish to proceed with Prolia therpay.  

## 2021-12-04 ENCOUNTER — Encounter: Payer: Self-pay | Admitting: Internal Medicine

## 2021-12-04 NOTE — Assessment & Plan Note (Signed)
On augmentin Plan- f/u with her PCP if fails to clear

## 2021-12-04 NOTE — Assessment & Plan Note (Signed)
Benefits from CPAP with good compliance and control Plan- continue CPAP auto 5-15 

## 2021-12-05 NOTE — Telephone Encounter (Signed)
Left VM for patient. If she calls back please have her speak with a nurse/CMA and inform I can send in fosamax if she would like to try for osteoporosis.   If any questions then please take the best time and phone number to call and I will try to call her back.   Rosemarie Ax, MD [facility] 12/05/2021, 12:32 PM

## 2021-12-26 ENCOUNTER — Ambulatory Visit (INDEPENDENT_AMBULATORY_CARE_PROVIDER_SITE_OTHER): Payer: Worker's Compensation | Admitting: Family Medicine

## 2021-12-26 ENCOUNTER — Telehealth: Payer: Self-pay | Admitting: Family Medicine

## 2021-12-26 ENCOUNTER — Encounter: Payer: Self-pay | Admitting: Family Medicine

## 2021-12-26 VITALS — BP 132/88 | HR 82 | Temp 97.6°F | Ht 67.5 in | Wt 195.5 lb

## 2021-12-26 DIAGNOSIS — M84376S Stress fracture, unspecified foot, sequela: Secondary | ICD-10-CM

## 2021-12-26 DIAGNOSIS — M8000XD Age-related osteoporosis with current pathological fracture, unspecified site, subsequent encounter for fracture with routine healing: Secondary | ICD-10-CM

## 2021-12-26 DIAGNOSIS — F988 Other specified behavioral and emotional disorders with onset usually occurring in childhood and adolescence: Secondary | ICD-10-CM

## 2021-12-26 DIAGNOSIS — R69 Illness, unspecified: Secondary | ICD-10-CM | POA: Diagnosis not present

## 2021-12-26 DIAGNOSIS — M8430XA Stress fracture, unspecified site, initial encounter for fracture: Secondary | ICD-10-CM | POA: Insufficient documentation

## 2021-12-26 MED ORDER — AMPHETAMINE-DEXTROAMPHETAMINE 20 MG PO TABS: 20.0000 mg | ORAL_TABLET | Freq: Two times a day (BID) | ORAL | 0 refills | Status: DC

## 2021-12-26 MED ORDER — ALENDRONATE SODIUM 70 MG PO TABS: 70.0000 mg | ORAL_TABLET | ORAL | 0 refills | Status: DC

## 2021-12-26 NOTE — Telephone Encounter (Signed)
Amanda from [employer] in [place] called and stated wants to know why patient is getting Adderal and she is 56 years old and there is no history. Call back number [phone].

## 2021-12-26 NOTE — Assessment & Plan Note (Addendum)
Both feet, noted stress and pre stress changes on MRI  This followed tx of tibial plateau fx with known OP  Pain with walking (also in setting of myofascial pain syndrome)  Needs help/guidance re: shoes and poss orthotics - will f/u with her podiatrist Dr Paulla Dolly  Rev ortho notes, sport med notes and MRI foot/ankle reports with pt today   Disc tx of OP with alendronate , ca and D  Disc safe exercise options

## 2021-12-26 NOTE — Assessment & Plan Note (Signed)
With fragility fx of tib plat and also stress fx in both feet  Pt is obese with hypothyroidism, long term ppi and low exercise due to fibromyalgia   Disc tx opt Rev dexa -lowest T score -2.4 in fn  Rev sport med notes Rev last labs for renal/ ca  Disc ca and D Disc exercise- unsure what will be tolerated, may try pedaling, is doing some strength training with her PT Med options- will try alendronate, disc poss side eff in detail Isnt to call if not tolerated  Disc dental issues=no antic jaw surgery/extractions or implants planned (disc need to hold med if invasive dental work needed)

## 2021-12-26 NOTE — Progress Notes (Unsigned)
Subjective:    Patient ID: [name], female    DOB: 08-Apr-1965, 56 y.o.   MRN: [id number]  HPI Pt presents for  foot problems , ADHD and dexa results   Wt Readings from Last 3 Encounters:  12/26/21 195 lb 8 oz (88.7 kg)  11/19/21 192 lb 6 oz (87.3 kg)  11/17/21 190 lb (86.2 kg)   30.17 kg/m  When she fell - had more bruising in L lateral foot  Both feet has issues with pain on bottom of feet and ankles  Under R great toe makes her walk on the outside of the foot  They hurt so bad that she cannot walk to go shopping/etc and has to take frequent breaks  Fibromyalgia   Sees Dr Marcelino Scot for orthopedics  Will release her in January Not treating her feet   She plans appt with Dr Regal/podiatry   Sent her to Dr Mariam Dollar for OP in [place] (sports med)   In PT - doing some strength training   Lab Results  Component Value Date   CALCIUM 9.5 09/17/2021   PHOS 4.0 12/08/2017     OP H/o low trauma fx and T score of -2.4   Also recent foot MRI noted some stress fracture changes in metatarsal heads , cuboid and 3rd metatarsal base  Poss calcaneous   Taking mag No calcium or D   ADHD Worse with current schedule Adderall xr 20 mg last thing - feels like she needs to be back on medication  Wants to try the short acting  Vyvanse caused headaches  Also takes wellbutrin for depression   Patient Active Problem List   Diagnosis Date Noted   Fractures, stress 12/26/2021   Immunosuppressed status ([place]) 11/19/2021   Non-recurrent acute serous otitis media of right ear 11/12/2021   Encounter for screening mammogram for breast cancer 09/24/2021   Osteoporosis 09/24/2021   Estrogen deficiency 09/01/2021   History of DVT (deep vein thrombosis) 07/11/2021   Closed fracture of right tibial plateau 07/10/2021   Hordeolum 05/22/2021   Sleep apnea 05/14/2021   BMI 30.0-30.9,adult 07/10/2020   Hyperlipidemia associated with type 2 diabetes mellitus ([place]) 07/10/2019   Cystoid macular  edema of both eyes 06/16/2019   Vitamin D deficiency 06/16/2019   Type 2 diabetes mellitus with hyperglycemia ([place]) 06/06/2019   Peripheral edema 10/30/2017   Moderate persistent asthma, uncomplicated 96/29/5284   Chronic rhinitis 08/10/2017   Cough 06/06/2017   Welcome to Medicare preventive visit 04/20/2017   Acute non-recurrent maxillary sinusitis 03/24/2017   Chronic low back pain 12/09/2015   Degeneration of lumbar or lumbosacral intervertebral disc 12/09/2015   Hypothyroid 01/24/2014   Pseudophakia of both eyes 12/15/2013   Nasal septal deviation 01/03/2013   Encounter for Medicare annual wellness exam 11/02/2012   Hydradenitis 11/02/2012   Cystoid macular degeneration of retina 05/13/2012   Macular hole 03/23/2012   Screening-pulmonary TB 03/23/2012   Routine general medical examination at a health care facility 10/02/2011   Iritis, recurrent 10/02/2011   Tachycardia 10/02/2011   ADD (attention deficit disorder) 08/20/2010   DYSHIDROTIC ECZEMA, HANDS 11/12/2009   Obstructive sleep apnea 11/19/2006   Depression with anxiety 09/29/2006   Essential hypertension 09/29/2006   Seasonal and perennial allergic rhinitis 09/29/2006   Asthma, mild intermittent, well-controlled 09/29/2006   GERD 09/29/2006   IBS 09/29/2006   ENDOMETRIOSIS 09/29/2006   FEMALE INFERTILITY 09/29/2006   Fibromyalgia 09/29/2006   INSOMNIA 09/29/2006   Past Medical History:  Diagnosis Date  ADHD (attention deficit hyperactivity disorder)    ADD - no meds   Allergy    allergic rhinitis   Anxiety    Asthma    Chronic headaches    Degenerative disc disease    Depression    Diabetes mellitus without complication ([place])    type 2   Eczema    no current problems as of 07/09/21   Fatty liver    Fibromyalgia    Fx of fibula 2009   GERD (gastroesophageal reflux disease)    Hemorrhoids    Hypertension    Hyperthyroidism    Hypothyroidism    IBS (irritable bowel syndrome)    Iritis, chronic     Macular hole of left eye 06/2017   PONV (postoperative nausea and vomiting)    Right leg DVT ([place])    Sleep apnea    uses CPAP nightly   Vitamin D insufficiency 06/16/2019   Past Surgical History:  Procedure Laterality Date   ABDOMINAL HYSTERECTOMY     ANTERIOR CRUCIATE LIGAMENT REPAIR Left 1996   small tear of ACL   BREAST EXCISIONAL BIOPSY Left 1995   BREAST SURGERY     left nipple removed - benign tumor   BUNIONECTOMY Bilateral    COLONOSCOPY     EYE SURGERY     cataracts removed, macular hole repaired left eye- both surgeries done at [facility]  08/2004   endometriosis - several    ORIF TIBIA FRACTURE Right 07/10/2021   Procedure: OPEN REDUCTION INTERNAL FIXATION (ORIF) TIBIAL PLATEAU FRACTURE;  Surgeon: Altamese Sargent, MD;  Location: [facility];  Service: Orthopedics;  Laterality: Right;   WISDOM TOOTH EXTRACTION     Social History   Tobacco Use   Smoking status: Never   Smokeless tobacco: Never  Vaping Use   Vaping Use: Never used  Substance Use Topics   Alcohol use: Never    Alcohol/week: 0.0 standard drinks of alcohol   Drug use: No   Family History  Problem Relation Age of Onset   Hypertension Mother    Thyroid disease Mother    Fibromyalgia Mother    Colon polyps Mother    Diabetes Father    Kidney cancer Maternal Grandmother    Colon cancer Neg Hx    Esophageal cancer Neg Hx    Stomach cancer Neg Hx    Rectal cancer Neg Hx    Allergic rhinitis Neg Hx    Angioedema Neg Hx    Asthma Neg Hx    Atopy Neg Hx    Eczema Neg Hx    Immunodeficiency Neg Hx    Urticaria Neg Hx    Allergies  Allergen Reactions   Amlodipine     Pedal edema    Cetirizine Hcl Other (See Comments)    Not effective   Erythromycin Nausea Only and Other (See Comments)    Caused a stomach ache   Escitalopram Oxalate Other (See Comments)    "No improvement"   Lisinopril Cough   Loratadine Other (See Comments)    "Not effective"   Nifedipine Other (See Comments)    "Fatigue  and tremors"   Sertraline Hcl Other (See Comments)    "Did not help"   Vyvanse [Lisdexamfetamine Dimesylate]     Headache and excitablility   Current Outpatient Medications on File Prior to Visit  Medication Sig Dispense Refill   acetaminophen (TYLENOL) 500 MG tablet Take 1 tablet (500 mg total) by mouth every 12 (twelve) hours. 60 tablet 0  ADVIL 200 MG CAPS Take 400 mg by mouth every 6 (six) hours as needed (for mild pain).     albuterol (PROVENTIL) (2.5 MG/3ML) 0.083% nebulizer solution Take 3 mLs (2.5 mg total) by nebulization every 4 (four) hours as needed for wheezing or shortness of breath. 75 mL 1   albuterol (VENTOLIN HFA) 108 (90 Base) MCG/ACT inhaler INHALE 2 PUFFS BY MOUTH EVERY 6 HOURS AS NEEDED FOR WHEEZING OR SHORTNESS OF BREATH 18 g 0   BD VEO INSULIN SYRINGE U/F 31G X 15/64" 1 ML MISC      buPROPion (WELLBUTRIN XL) 300 MG 24 hr tablet Take 1 tablet (300 mg total) by mouth daily. 90 tablet 3   carvedilol (COREG) 6.25 MG tablet Take 1 tablet (6.25 mg total) by mouth 2 (two) times daily with a meal. 180 tablet 3   cetirizine (ZYRTEC) 10 MG tablet Take 10 mg by mouth daily as needed for allergies or rhinitis.     diphenhydrAMINE (BENADRYL) 25 mg capsule Take 25-50 mg by mouth every 6 (six) hours as needed for allergies.     esomeprazole (NEXIUM) 20 MG capsule Take 1 capsule (20 mg total) by mouth at bedtime. 90 capsule 3   folic acid (FOLVITE) 1 MG tablet Take 3 mg by mouth See admin instructions. Take 3 mg by mouth once a day on Wed/Thurs/Fri/Sat     glucose blood (ONETOUCH VERIO) test strip To check glucose daily and prn for diabetes type 2 100 each 3   HUMIRA PEN 40 MG/0.8ML PNKT Inject 40 mg into the skin every Monday.     hydrochlorothiazide (MICROZIDE) 12.5 MG capsule Take 1 capsule (12.5 mg total) by mouth daily. 90 capsule 3   Insulin Syringe-Needle U-100 (INSULIN SYRINGE 1CC/31GX5/16") 31G X 5/16" 1 ML MISC See admin instructions.     ketorolac (ACULAR) 0.5 % ophthalmic  solution Place 1 drop into the left eye 2 (two) times daily.     Lancets (ONETOUCH DELICA PLUS ZOXWRU04V) MISC SMARTSIG:1 Lancet(s) Topical Twice Daily PRN     leucovorin (WELLCOVORIN) 5 MG tablet Take 5 mg by mouth See admin instructions. Take 5 mg by mouth once a day on only Sundays and Tuesdays     magnesium oxide (MAG-OX) 400 MG tablet Take 400 mg by mouth daily.     metFORMIN (GLUCOPHAGE) 1000 MG tablet Take 1 tablet (1,000 mg total) by mouth 2 (two) times daily with a meal. 180 tablet 3   Methotrexate 25 MG/ML SOSY Inject 17.5 mg into the skin every Monday.     mometasone-formoterol (DULERA) 100-5 MCG/ACT AERO Inhale 2 puffs into the lungs 2 (two) times daily. 1 each 5   prednisoLONE acetate (PRED FORTE) 1 % ophthalmic suspension Place 4 drops into the left eye 2 (two) times daily.     rosuvastatin (CRESTOR) 5 MG tablet Take 1 tablet (5 mg total) by mouth every other day. 45 tablet 3   traMADol (ULTRAM) 50 MG tablet Take 50 mg by mouth every 6 (six) hours as needed (for fibromyalgia flares).     No current facility-administered medications on file prior to visit.     Review of Systems  Constitutional:  Negative for activity change, appetite change, fatigue, fever and unexpected weight change.  HENT:  Negative for congestion, ear pain, rhinorrhea, sinus pressure and sore throat.   Eyes:  Negative for pain, redness and visual disturbance.  Respiratory:  Negative for cough, shortness of breath and wheezing.   Cardiovascular:  Negative for chest pain  and palpitations.  Gastrointestinal:  Negative for abdominal pain, blood in stool, constipation and diarrhea.  Endocrine: Negative for polydipsia and polyuria.  Genitourinary:  Negative for dysuria, frequency and urgency.  Musculoskeletal:  Positive for arthralgias. Negative for back pain and myalgias.  Skin:  Negative for pallor and rash.  Allergic/Immunologic: Negative for environmental allergies.  Neurological:  Negative for dizziness,  syncope and headaches.  Hematological:  Negative for adenopathy. Does not bruise/bleed easily.  Psychiatric/Behavioral:  Positive for decreased concentration. Negative for dysphoric mood. The patient is not nervous/anxious.        Objective:   Physical Exam Constitutional:      General: She is not in acute distress.    Appearance: Normal appearance. She is well-developed. She is obese. She is not ill-appearing or diaphoretic.  HENT:     Head: Normocephalic and atraumatic.  Eyes:     Conjunctiva/sclera: Conjunctivae normal.     Pupils: Pupils are equal, round, and reactive to light.  Neck:     Thyroid: No thyromegaly.     Vascular: No carotid bruit or JVD.  Cardiovascular:     Rate and Rhythm: Normal rate and regular rhythm.     Heart sounds: Normal heart sounds.     No gallop.  Pulmonary:     Effort: Pulmonary effort is normal. No respiratory distress.     Breath sounds: Normal breath sounds. No wheezing or rales.  Abdominal:     General: There is no distension or abdominal bruit.     Palpations: Abdomen is soft.  Musculoskeletal:     Cervical back: Normal range of motion and neck supple.     Right lower leg: No edema.     Left lower leg: No edema.     Comments: Tenderness in arches of feet and metatarsal heads  No swelling  No kyphosis   Medium frame   Lymphadenopathy:     Cervical: No cervical adenopathy.  Skin:    General: Skin is warm and dry.     Coloration: Skin is not pale.     Findings: No rash.  Neurological:     Mental Status: She is alert.     Motor: No weakness.     Coordination: Coordination normal.     Deep Tendon Reflexes: Reflexes are normal and symmetric. Reflexes normal.  Psychiatric:        Attention and Perception: Attention normal.        Mood and Affect: Mood normal.        Cognition and Memory: Cognition normal.     Comments: Candidly discusses symptoms and stressors             Assessment & Plan:   Problem List Items Addressed  This Visit       Musculoskeletal and Integument   Fractures, stress    Both feet, noted stress and pre stress changes on MRI  This followed tx of tibial plateau fx with known OP  Pain with walking (also in setting of myofascial pain syndrome)  Needs help/guidance re: shoes and poss orthotics - will f/u with her podiatrist Dr Paulla Dolly  Rev ortho notes, sport med notes and MRI foot/ankle reports with pt today   Disc tx of OP with alendronate , ca and D  Disc safe exercise options      Osteoporosis - Primary    With fragility fx of tib plat and also stress fx in both feet  Pt is obese with hypothyroidism, long term ppi and low  exercise due to fibromyalgia   Disc tx opt Rev dexa -lowest T score -2.4 in fn  Rev sport med notes Rev last labs for renal/ ca  Disc ca and D Disc exercise- unsure what will be tolerated, may try pedaling, is doing some strength training with her PT Med options- will try alendronate, disc poss side eff in detail Isnt to call if not tolerated  Disc dental issues=no antic jaw surgery/extractions or implants planned (disc need to hold med if invasive dental work needed)       Relevant Medications   alendronate (FOSAMAX) 70 MG tablet     Other   ADD (attention deficit disorder)    Struggling more at work with attentiveness and focus  Mood is ok   Wants to try short acting adderall Poss side eff disc Will try 20 mg bid  Disc refill protocol, and inability to fill if lost or stolen  Will update in a mo

## 2021-12-26 NOTE — Telephone Encounter (Signed)
Spoke to [place] advised her of pt's diagnosis and date of last fill for her adderall

## 2021-12-26 NOTE — Assessment & Plan Note (Signed)
Struggling more at work with attentiveness and focus  Mood is ok   Wants to try short acting adderall Poss side eff disc Will try 20 mg bid  Disc refill protocol, and inability to fill if lost or stolen  Will update in a mo

## 2021-12-26 NOTE — Patient Instructions (Addendum)
Try to get 1200-1500 mg of calcium per day with at least 2000 iu of vitamin D - for bone health   If calcium constipates you then just take the vitamin D   Eventually exercise is one of the most important things to do  Weight bearing and strength training    I want to try a 5 year course of fosamax (alendronate)   Follow up with podiatry as planned for your feet  See what shoe and orthotic are recommended   Start short acting adderall 20 mg twice daily  Alert [place] if you get side effects or have problems

## 2022-02-13 ENCOUNTER — Encounter: Payer: Self-pay | Admitting: Family Medicine

## 2022-02-13 ENCOUNTER — Ambulatory Visit (INDEPENDENT_AMBULATORY_CARE_PROVIDER_SITE_OTHER): Payer: Medicare HMO | Admitting: Family Medicine

## 2022-02-13 VITALS — BP 160/92 | HR 80 | Temp 97.6°F | Ht 67.5 in | Wt 195.4 lb

## 2022-02-13 DIAGNOSIS — L089 Local infection of the skin and subcutaneous tissue, unspecified: Secondary | ICD-10-CM

## 2022-02-13 DIAGNOSIS — I10 Essential (primary) hypertension: Secondary | ICD-10-CM | POA: Diagnosis not present

## 2022-02-13 DIAGNOSIS — L723 Sebaceous cyst: Secondary | ICD-10-CM

## 2022-02-13 MED ORDER — AMPHETAMINE-DEXTROAMPHETAMINE 20 MG PO TABS: 20.0000 mg | ORAL_TABLET | Freq: Two times a day (BID) | ORAL | 0 refills | Status: DC

## 2022-02-13 MED ORDER — CEPHALEXIN 500 MG PO CAPS: 500.0000 mg | ORAL_CAPSULE | Freq: Three times a day (TID) | ORAL | 0 refills | Status: DC

## 2022-02-13 MED ORDER — FLUCONAZOLE 150 MG PO TABS: 150.0000 mg | ORAL_TABLET | Freq: Once | ORAL | 0 refills | Status: DC

## 2022-02-13 NOTE — Progress Notes (Unsigned)
Subjective:    Patient ID: [name], female    DOB: 10-10-1965, 57 y.o.   MRN: [id number]  HPI Pt presents for bump on R arm  Needed refill on adderall   Wt Readings from Last 3 Encounters:  02/13/22 195 lb 6 oz (88.6 kg)  12/26/21 195 lb 8 oz (88.7 kg)  11/19/21 192 lb 6 oz (87.3 kg)   30.15 kg/m  Vitals:   02/13/22 0848  BP: (!) 160/92  Pulse: 80  Temp: 97.6 F (36.4 C)  SpO2: 97%   Bump came up on R anterior shoulder a month ago  Started to drain / ? Pus  She expressed it  Looks irritated   Worried about infection   Tender now  Did not bother her before    HTN bp is stable today  No cp or palpitations or headaches or edema  No side effects to medicines  BP Readings from Last 3 Encounters:  02/13/22 (!) 160/92  12/26/21 132/88  11/19/21 (!) 168/100    Coreg 6.25 mg bid  Hctz 12.5 mg daily   Pulse Readings from Last 3 Encounters:  02/13/22 80  12/26/21 82  11/19/21 97     Lab Results  Component Value Date   CREATININE 0.80 09/17/2021   BUN 12 09/17/2021   NA 137 09/17/2021   K 4.4 09/17/2021   CL 101 09/17/2021   CO2 24 09/17/2021  GFR 83  Patient Active Problem List   Diagnosis Date Noted   Infected sebaceous cyst 02/13/2022   Fractures, stress 12/26/2021   Immunosuppressed status ([place]) 11/19/2021   Non-recurrent acute serous otitis media of right ear 11/12/2021   Encounter for screening mammogram for breast cancer 09/24/2021   Osteoporosis 09/24/2021   Estrogen deficiency 09/01/2021   History of DVT (deep vein thrombosis) 07/11/2021   Closed fracture of right tibial plateau 07/10/2021   Sleep apnea 05/14/2021   BMI 30.0-30.9,adult 07/10/2020   Hyperlipidemia associated with type 2 diabetes mellitus ([place]) 07/10/2019   Cystoid macular edema of both eyes 06/16/2019   Vitamin D deficiency 06/16/2019   Type 2 diabetes mellitus with hyperglycemia ([place]) 06/06/2019   Peripheral edema 10/30/2017   Moderate persistent asthma,  uncomplicated [id number]   Chronic rhinitis 08/10/2017   Cough 06/06/2017   Welcome to Medicare preventive visit 04/20/2017   Chronic low back pain 12/09/2015   Degeneration of lumbar or lumbosacral intervertebral disc 12/09/2015   Hypothyroid 01/24/2014   Pseudophakia of both eyes 12/15/2013   Nasal septal deviation 01/03/2013   Encounter for Medicare annual wellness exam 11/02/2012   Hydradenitis 11/02/2012   Cystoid macular degeneration of retina 05/13/2012   Macular hole 03/23/2012   Screening-pulmonary TB 03/23/2012   Routine general medical examination at a health care facility 10/02/2011   Iritis, recurrent 10/02/2011   Tachycardia 10/02/2011   ADD (attention deficit disorder) 08/20/2010   DYSHIDROTIC ECZEMA, HANDS 11/12/2009   Obstructive sleep apnea 11/19/2006   Depression with anxiety 09/29/2006   Essential hypertension 09/29/2006   Seasonal and perennial allergic rhinitis 09/29/2006   Asthma, mild intermittent, well-controlled 09/29/2006   GERD 09/29/2006   IBS 09/29/2006   ENDOMETRIOSIS 09/29/2006   FEMALE INFERTILITY 09/29/2006   Fibromyalgia 09/29/2006   INSOMNIA 09/29/2006   Past Medical History:  Diagnosis Date   ADHD (attention deficit hyperactivity disorder)    ADD - no meds   Allergy    allergic rhinitis   Anxiety    Asthma    Chronic headaches  Degenerative disc disease    Depression    Diabetes mellitus without complication ([place])    type 2   Eczema    no current problems as of 07/09/21   Fatty liver    Fibromyalgia    Fx of fibula 2009   GERD (gastroesophageal reflux disease)    Hemorrhoids    Hypertension    Hyperthyroidism    Hypothyroidism    IBS (irritable bowel syndrome)    Iritis, chronic    Macular hole of left eye 06/2017   PONV (postoperative nausea and vomiting)    Right leg DVT ([place])    Sleep apnea    uses CPAP nightly   Vitamin D insufficiency 06/16/2019   Past Surgical History:  Procedure Laterality Date   ABDOMINAL  HYSTERECTOMY     ANTERIOR CRUCIATE LIGAMENT REPAIR Left 1996   small tear of ACL   BREAST EXCISIONAL BIOPSY Left 1995   BREAST SURGERY     left nipple removed - benign tumor   BUNIONECTOMY Bilateral    COLONOSCOPY     EYE SURGERY     cataracts removed, macular hole repaired left eye- both surgeries done at [facility]  08/2004   endometriosis - several    ORIF TIBIA FRACTURE Right 07/10/2021   Procedure: OPEN REDUCTION INTERNAL FIXATION (ORIF) TIBIAL PLATEAU FRACTURE;  Surgeon: Altamese [place], MD;  Location: [facility];  Service: Orthopedics;  Laterality: Right;   WISDOM TOOTH EXTRACTION     Social History   Tobacco Use   Smoking status: Never   Smokeless tobacco: Never  Vaping Use   Vaping Use: Never used  Substance Use Topics   Alcohol use: Never    Alcohol/week: 0.0 standard drinks of alcohol   Drug use: No   Family History  Problem Relation Age of Onset   Hypertension Mother    Thyroid disease Mother    Fibromyalgia Mother    Colon polyps Mother    Diabetes Father    Kidney cancer Maternal Grandmother    Colon cancer Neg Hx    Esophageal cancer Neg Hx    Stomach cancer Neg Hx    Rectal cancer Neg Hx    Allergic rhinitis Neg Hx    Angioedema Neg Hx    Asthma Neg Hx    Atopy Neg Hx    Eczema Neg Hx    Immunodeficiency Neg Hx    Urticaria Neg Hx    Allergies  Allergen Reactions   Amlodipine     Pedal edema    Cetirizine Hcl Other (See Comments)    Not effective   Erythromycin Nausea Only and Other (See Comments)    Caused a stomach ache   Escitalopram Oxalate Other (See Comments)    "No improvement"   Lisinopril Cough   Loratadine Other (See Comments)    "Not effective"   Nifedipine Other (See Comments)    "Fatigue and tremors"   Sertraline Hcl Other (See Comments)    "Did not help"   Vyvanse [Lisdexamfetamine Dimesylate]     Headache and excitablility   Current Outpatient Medications on File Prior to Visit  Medication Sig Dispense Refill    acetaminophen (TYLENOL) 500 MG tablet Take 1 tablet (500 mg total) by mouth every 12 (twelve) hours. 60 tablet 0   ADVIL 200 MG CAPS Take 400 mg by mouth every 6 (six) hours as needed (for mild pain).     albuterol (PROVENTIL) (2.5 MG/3ML) 0.083% nebulizer solution Take 3 mLs (2.5 mg  total) by nebulization every 4 (four) hours as needed for wheezing or shortness of breath. 75 mL 1   albuterol (VENTOLIN HFA) 108 (90 Base) MCG/ACT inhaler INHALE 2 PUFFS BY MOUTH EVERY 6 HOURS AS NEEDED FOR WHEEZING OR SHORTNESS OF BREATH 18 g 0   alendronate (FOSAMAX) 70 MG tablet Take 1 tablet (70 mg total) by mouth every 7 (seven) days. Take with a full glass of water on an empty stomach. 12 tablet 0   BD VEO INSULIN SYRINGE U/F 31G X 15/64" 1 ML MISC      buPROPion (WELLBUTRIN XL) 300 MG 24 hr tablet Take 1 tablet (300 mg total) by mouth daily. 90 tablet 3   CALCIUM-VITAMIN D PO Take 1 capsule by mouth in the morning and at bedtime.     carvedilol (COREG) 6.25 MG tablet Take 1 tablet (6.25 mg total) by mouth 2 (two) times daily with a meal. 180 tablet 3   cetirizine (ZYRTEC) 10 MG tablet Take 10 mg by mouth daily as needed for allergies or rhinitis.     diphenhydrAMINE (BENADRYL) 25 mg capsule Take 25-50 mg by mouth every 6 (six) hours as needed for allergies.     esomeprazole (NEXIUM) 20 MG capsule Take 1 capsule (20 mg total) by mouth at bedtime. 90 capsule 3   folic acid (FOLVITE) 1 MG tablet Take 3 mg by mouth See admin instructions. Take 3 mg by mouth once a day on Wed/Thurs/Fri/Sat     glucose blood (ONETOUCH VERIO) test strip To check glucose daily and prn for diabetes type 2 100 each 3   HUMIRA PEN 40 MG/0.8ML PNKT Inject 40 mg into the skin every Monday.     hydrochlorothiazide (MICROZIDE) 12.5 MG capsule Take 1 capsule (12.5 mg total) by mouth daily. 90 capsule 3   Insulin Syringe-Needle U-100 (INSULIN SYRINGE 1CC/31GX5/16") 31G X 5/16" 1 ML MISC See admin instructions.     ketorolac (ACULAR) 0.5 %  ophthalmic solution Place 1 drop into the left eye 2 (two) times daily.     Lancets (ONETOUCH DELICA PLUS 123XX123) MISC SMARTSIG:1 Lancet(s) Topical Twice Daily PRN     leucovorin (WELLCOVORIN) 5 MG tablet Take 5 mg by mouth See admin instructions. Take 5 mg by mouth once a day on only Sundays and Tuesdays     MAGNESIUM GLYCINATE PO Take 1 capsule by mouth at bedtime.     metFORMIN (GLUCOPHAGE) 1000 MG tablet Take 1 tablet (1,000 mg total) by mouth 2 (two) times daily with a meal. 180 tablet 3   Methotrexate 25 MG/ML SOSY Inject 17.5 mg into the skin every Monday.     mometasone-formoterol (DULERA) 100-5 MCG/ACT AERO Inhale 2 puffs into the lungs 2 (two) times daily. 1 each 5   prednisoLONE acetate (PRED FORTE) 1 % ophthalmic suspension Place 4 drops into the left eye 2 (two) times daily.     rosuvastatin (CRESTOR) 5 MG tablet Take 1 tablet (5 mg total) by mouth every other day. 45 tablet 3   traMADol (ULTRAM) 50 MG tablet Take 50 mg by mouth every 6 (six) hours as needed (for fibromyalgia flares).     No current facility-administered medications on file prior to visit.     Review of Systems     Objective:   Physical Exam Constitutional:      General: She is not in acute distress.    Appearance: Normal appearance. She is obese. She is not ill-appearing.  Pulmonary:     Effort: Pulmonary effort  is normal. No respiratory distress.     Breath sounds: Normal breath sounds.  Musculoskeletal:     Cervical back: Neck supple.  Lymphadenopathy:     Cervical: No cervical adenopathy.  Skin:    General: Skin is warm and dry.     Comments: 1.5 cm round erythematous bump consistent with cyst on R ant shoulder  After consent obtained- cleaned and expressed small amt of pus (sent for cx) and caseous material  Some relief Pt tolerated well  Dressed with band aid  No other lesions  Solar lentigines diffusely   Neurological:     Mental Status: She is alert.  Psychiatric:        Mood and  Affect: Mood normal.             Assessment & Plan:   Problem List Items Addressed This Visit       Cardiovascular and Mediastinum   Essential hypertension    Bp was up likely due to discomfort  Asked to monitor at home an update        Musculoskeletal and Integument   Infected sebaceous cyst - Primary    Right ant shoulder, 1.5 cm , able to express pus (sent for cx) as well as caseous material  Px keflex 500 mg tid  Inst to call if worse- inc red/size/pain Disc care with soap/water and light covering , abx oint otc is also ok Pend cx result and will taylor tx if needed  Pt is diabetic and also on mtx  Update if not starting to improve in a week or if worsening        Relevant Medications   cephALEXin (KEFLEX) 500 MG capsule   fluconazole (DIFLUCAN) 150 MG tablet   Other Relevant Orders   WOUND CULTURE

## 2022-02-13 NOTE — Patient Instructions (Signed)
Keep area clean with soap and water  Cover lightly if needed   Take keflex as directed If worse = call this number   Watch for increased redness/ swelling/pain   We will alert you when culture returns

## 2022-02-13 NOTE — Assessment & Plan Note (Signed)
Bp was up likely due to discomfort  Asked to monitor at home an update

## 2022-02-13 NOTE — Assessment & Plan Note (Addendum)
Right ant shoulder, 1.5 cm , able to express pus (sent for cx) as well as caseous material  Px keflex 500 mg tid  Inst to call if worse- inc red/size/pain Disc care with soap/water and light covering , abx oint otc is also ok Pend cx result and will taylor tx if needed  Pt is diabetic and also on mtx  Update if not starting to improve in a week or if worsening   Addendum Cx grew out mrsa Sent in doxy and inst to update

## 2022-02-20 LAB — WOUND CULTURE
MICRO NUMBER:: 14558449
SPECIMEN QUALITY:: ADEQUATE

## 2022-02-20 MED ORDER — DOXYCYCLINE HYCLATE 100 MG PO TABS: 100.0000 mg | ORAL_TABLET | Freq: Two times a day (BID) | ORAL | 0 refills | Status: DC

## 2022-02-20 NOTE — Addendum Note (Signed)
Addended by: Loura Pardon A on: 02/20/2022 04:39 PM   Modules accepted: Orders

## 2022-02-24 ENCOUNTER — Other Ambulatory Visit: Payer: Self-pay | Admitting: Family Medicine

## 2022-02-24 NOTE — Telephone Encounter (Signed)
Pt has been on 2 different abx recently, please advise

## 2022-02-27 DIAGNOSIS — Z79899 Other long term (current) drug therapy: Secondary | ICD-10-CM | POA: Diagnosis not present

## 2022-02-27 DIAGNOSIS — H3023 Posterior cyclitis, bilateral: Secondary | ICD-10-CM | POA: Diagnosis not present

## 2022-02-27 DIAGNOSIS — H35353 Cystoid macular degeneration, bilateral: Secondary | ICD-10-CM | POA: Diagnosis not present

## 2022-02-27 DIAGNOSIS — H20023 Recurrent acute iridocyclitis, bilateral: Secondary | ICD-10-CM | POA: Diagnosis not present

## 2022-02-27 DIAGNOSIS — H35342 Macular cyst, hole, or pseudohole, left eye: Secondary | ICD-10-CM | POA: Diagnosis not present

## 2022-03-05 ENCOUNTER — Encounter: Payer: Self-pay | Admitting: Family Medicine

## 2022-03-06 DIAGNOSIS — G4733 Obstructive sleep apnea (adult) (pediatric): Secondary | ICD-10-CM | POA: Diagnosis not present

## 2022-03-11 NOTE — Telephone Encounter (Signed)
Done and in IN box Please let her know

## 2022-03-25 ENCOUNTER — Ambulatory Visit (INDEPENDENT_AMBULATORY_CARE_PROVIDER_SITE_OTHER): Payer: Medicare HMO | Admitting: Family Medicine

## 2022-03-25 ENCOUNTER — Encounter: Payer: Self-pay | Admitting: Family Medicine

## 2022-03-25 ENCOUNTER — Ambulatory Visit (INDEPENDENT_AMBULATORY_CARE_PROVIDER_SITE_OTHER)
Admission: RE | Admit: 2022-03-25 | Discharge: 2022-03-25 | Disposition: A | Discharge status: Home / Self Care | Payer: Medicare HMO | Source: Ambulatory Visit | Attending: Family Medicine | Admitting: Family Medicine

## 2022-03-25 VITALS — BP 145/92 | HR 83 | Temp 97.8°F | Ht 67.5 in | Wt 193.2 lb

## 2022-03-25 DIAGNOSIS — R058 Other specified cough: Secondary | ICD-10-CM

## 2022-03-25 DIAGNOSIS — I10 Essential (primary) hypertension: Secondary | ICD-10-CM | POA: Diagnosis not present

## 2022-03-25 DIAGNOSIS — I700 Atherosclerosis of aorta: Secondary | ICD-10-CM | POA: Diagnosis not present

## 2022-03-25 DIAGNOSIS — E1169 Type 2 diabetes mellitus with other specified complication: Secondary | ICD-10-CM | POA: Diagnosis not present

## 2022-03-25 DIAGNOSIS — E1165 Type 2 diabetes mellitus with hyperglycemia: Secondary | ICD-10-CM

## 2022-03-25 DIAGNOSIS — E785 Hyperlipidemia, unspecified: Secondary | ICD-10-CM | POA: Diagnosis not present

## 2022-03-25 DIAGNOSIS — R059 Cough, unspecified: Secondary | ICD-10-CM | POA: Diagnosis not present

## 2022-03-25 LAB — POCT GLYCOSYLATED HEMOGLOBIN (HGB A1C): Hemoglobin A1C: 7.6 % — AB (ref 4.0–5.6)

## 2022-03-25 MED ORDER — CHLORTHALIDONE 25 MG PO TABS: 25.0000 mg | ORAL_TABLET | Freq: Every day | ORAL | 0 refills | Status: DC

## 2022-03-25 MED ORDER — PROMETHAZINE-DM 6.25-15 MG/5ML PO SYRP: 5.0000 mL | ORAL_SOLUTION | Freq: Three times a day (TID) | ORAL | 0 refills | Status: DC | PRN

## 2022-03-25 MED ORDER — BENZONATATE 200 MG PO CAPS: 200.0000 mg | ORAL_CAPSULE | Freq: Three times a day (TID) | ORAL | 1 refills | Status: DC | PRN

## 2022-03-25 NOTE — Assessment & Plan Note (Signed)
Disc goals for lipids and reasons to control them Rev last labs with pt Rev low sat fat diet in detail LDL is 83 Goal below 70 Now tolerates crestor 5 mg daily   Recent cxr noted aortic atherosclerosis-will watch

## 2022-03-25 NOTE — Progress Notes (Signed)
Subjective:    Patient ID: [name], female    DOB: Nov 08, 1965, 57 y.o.   MRN: [id number]  HPI Pt presents for f/u of chronic health problems and diabetes HTN  Wt Readings from Last 3 Encounters:  03/25/22 193 lb 4 oz (87.7 kg)  02/13/22 195 lb 6 oz (88.6 kg)  12/26/21 195 lb 8 oz (88.7 kg)   29.82 kg/m  Post covid  Lingering cough since covid Sense of taste and smell is off   Non stop cough No full night of sleep in weeks Delsym is  not helping  Occ phlegm - tan to clear  No wheezing or sob  Asthma is ok overall  Is tired also  No headache now  Nose runs but no symptoms of sinus infection   Cxr today  DG Chest 2 View  Result Date: 03/25/2022 CLINICAL DATA:  ongoing cough after covid 19      seldom productive EXAM: CHEST - 2 VIEW COMPARISON:  Chest x-ray 05/13/2020 FINDINGS: The heart and mediastinal contours are within normal limits. Aortic calcification. No focal consolidation. No pulmonary edema. No pleural effusion. No pneumothorax. No acute osseous abnormality. IMPRESSION: 1. No active cardiopulmonary disease. 2.  Aortic Atherosclerosis (ICD10-I70.0). Electronically Signed   By: Iven Finn M.D.   On: 03/25/2022 08:56     Not taking zyrtec  Or flonase    Wt Readings from Last 3 Encounters:  03/25/22 193 lb 4 oz (87.7 kg)  02/13/22 195 lb 6 oz (88.6 kg)  12/26/21 195 lb 8 oz (88.7 kg)   29.82 kg/m  Vitals:   03/25/22 0759  BP: (!) 156/92  Pulse: 83  Temp: 97.8 F (36.6 C)  SpO2: 97%       HTN bp is up on first check today  No cp or palpitations or headaches or edema  No side effects to medicines  BP Readings from Last 3 Encounters:  03/25/22 (!) 145/92  02/13/22 (!) 160/92  12/26/21 132/88     Coreg 6.25 mg bid Hctz 12.5 mg daily   Tried sudafed for congestion - last dose was 18 hours ago   Lab Results  Component Value Date   CREATININE 0.80 09/17/2021   BUN 12 09/17/2021   NA 137 09/17/2021   K 4.4 09/17/2021   CL 101  09/17/2021   CO2 24 [id number]   Last metabolic panel Lab Results  Component Value Date   GLUCOSE 102 (H) 09/17/2021   NA 137 09/17/2021   K 4.4 09/17/2021   CL 101 09/17/2021   CO2 24 09/17/2021   BUN 12 09/17/2021   CREATININE 0.80 09/17/2021   GFRNONAA >60 07/10/2021   CALCIUM 9.5 09/17/2021   PHOS 4.0 12/08/2017   PROT 7.3 09/17/2021   ALBUMIN 4.0 09/17/2021   BILITOT 0.6 09/17/2021   ALKPHOS 102 09/17/2021   AST 30 09/17/2021   ALT 10 09/17/2021   ANIONGAP 12 07/10/2021   Cannot take amlodipine or ace or arb   Pulse Readings from Last 3 Encounters:  03/25/22 83  02/13/22 80  12/26/21 82     DM2 Lab Results  Component Value Date   HGBA1C 6.4 09/17/2021   Today 7.6   Missed 4 days of medication early march due to vomiting from covid  Had covid  Abscess she had prior it much better-that made it go up also   Diet is not optimal  Still cannot taste much from having covid - eating what she can taste some  of  ? How long that will last   No exercise at all right now  When feeling better can do some wwalking      Lab Results  Component Value Date   MICROALBUR 1.1 09/17/2021   MICROALBUR <0.7 10/02/2019     Metformin 1000 mg bid  Takes statin  Intol of ace and arb   Eye exam 08/2021- has frequently for uveitis   Hyperlipidemia Lab Results  Component Value Date   CHOL 172 09/17/2021   HDL 55.60 09/17/2021   LDLCALC 83 09/17/2021   TRIG 164.0 (H) 09/17/2021   CHOLHDL 3 09/17/2021   Crestor 5 mg every other day- this is most statin tolerated   Patient Active Problem List   Diagnosis Date Noted   Post-viral cough syndrome 03/25/2022   Aortic atherosclerosis ([place]) 03/25/2022   Fractures, stress 12/26/2021   Immunosuppressed status ([place]) 11/19/2021   Encounter for screening mammogram for breast cancer 09/24/2021   Osteoporosis 09/24/2021   Estrogen deficiency 09/01/2021   History of DVT (deep vein thrombosis) 07/11/2021   Closed fracture of  right tibial plateau 07/10/2021   Sleep apnea 05/14/2021   BMI 30.0-30.9,adult 07/10/2020   Hyperlipidemia associated with type 2 diabetes mellitus ([place]) 07/10/2019   Cystoid macular edema of both eyes 06/16/2019   Vitamin D deficiency 06/16/2019   Type 2 diabetes mellitus with hyperglycemia ([place]) 06/06/2019   Peripheral edema 10/30/2017   Moderate persistent asthma, uncomplicated [id number]   Chronic rhinitis 08/10/2017   Cough 06/06/2017   Welcome to Medicare preventive visit 04/20/2017   Chronic low back pain 12/09/2015   Degeneration of lumbar or lumbosacral intervertebral disc 12/09/2015   Hypothyroid 01/24/2014   Pseudophakia of both eyes 12/15/2013   Nasal septal deviation 01/03/2013   Encounter for Medicare annual wellness exam 11/02/2012   Hydradenitis 11/02/2012   Cystoid macular degeneration of retina 05/13/2012   Macular hole 03/23/2012   Screening-pulmonary TB 03/23/2012   Routine general medical examination at a health care facility 10/02/2011   Iritis, recurrent 10/02/2011   Tachycardia 10/02/2011   ADD (attention deficit disorder) 08/20/2010   DYSHIDROTIC ECZEMA, HANDS 11/12/2009   Obstructive sleep apnea 11/19/2006   Depression with anxiety 09/29/2006   Essential hypertension 09/29/2006   Seasonal and perennial allergic rhinitis 09/29/2006   Asthma, mild intermittent, well-controlled 09/29/2006   GERD 09/29/2006   IBS 09/29/2006   ENDOMETRIOSIS 09/29/2006   FEMALE INFERTILITY 09/29/2006   Fibromyalgia 09/29/2006   INSOMNIA 09/29/2006   Past Medical History:  Diagnosis Date   ADHD (attention deficit hyperactivity disorder)    ADD - no meds   Allergy    allergic rhinitis   Anxiety    Asthma    Chronic headaches    Degenerative disc disease    Depression    Diabetes mellitus without complication ([place])    type 2   Eczema    no current problems as of 07/09/21   Fatty liver    Fibromyalgia    Fx of fibula 2009   GERD (gastroesophageal reflux disease)     Hemorrhoids    Hypertension    Hyperthyroidism    Hypothyroidism    IBS (irritable bowel syndrome)    Iritis, chronic    Macular hole of left eye 06/2017   PONV (postoperative nausea and vomiting)    Right leg DVT ([place])    Sleep apnea    uses CPAP nightly   Vitamin D insufficiency 06/16/2019   Past Surgical History:  Procedure Laterality Date  ABDOMINAL HYSTERECTOMY     ANTERIOR CRUCIATE LIGAMENT REPAIR Left 1996   small tear of ACL   BREAST EXCISIONAL BIOPSY Left 1995   BREAST SURGERY     left nipple removed - benign tumor   BUNIONECTOMY Bilateral    COLONOSCOPY     EYE SURGERY     cataracts removed, macular hole repaired left eye- both surgeries done at [facility]  08/2004   endometriosis - several    ORIF TIBIA FRACTURE Right 07/10/2021   Procedure: OPEN REDUCTION INTERNAL FIXATION (ORIF) TIBIAL PLATEAU FRACTURE;  Surgeon: Altamese Brookhaven, MD;  Location: [facility];  Service: Orthopedics;  Laterality: Right;   WISDOM TOOTH EXTRACTION     Social History   Tobacco Use   Smoking status: Never   Smokeless tobacco: Never  Vaping Use   Vaping Use: Never used  Substance Use Topics   Alcohol use: Never    Alcohol/week: 0.0 standard drinks of alcohol   Drug use: No   Family History  Problem Relation Age of Onset   Hypertension Mother    Thyroid disease Mother    Fibromyalgia Mother    Colon polyps Mother    Diabetes Father    Kidney cancer Maternal Grandmother    Colon cancer Neg Hx    Esophageal cancer Neg Hx    Stomach cancer Neg Hx    Rectal cancer Neg Hx    Allergic rhinitis Neg Hx    Angioedema Neg Hx    Asthma Neg Hx    Atopy Neg Hx    Eczema Neg Hx    Immunodeficiency Neg Hx    Urticaria Neg Hx    Allergies  Allergen Reactions   Amlodipine     Pedal edema    Cetirizine Hcl Other (See Comments)    Not effective   Erythromycin Nausea Only and Other (See Comments)    Caused a stomach ache   Escitalopram Oxalate Other (See Comments)    "No  improvement"   Lisinopril Cough   Loratadine Other (See Comments)    "Not effective"   Nifedipine Other (See Comments)    "Fatigue and tremors"   Sertraline Hcl Other (See Comments)    "Did not help"   Vyvanse [Lisdexamfetamine Dimesylate]     Headache and excitablility   Current Outpatient Medications on File Prior to Visit  Medication Sig Dispense Refill   acetaminophen (TYLENOL) 500 MG tablet Take 1 tablet (500 mg total) by mouth every 12 (twelve) hours. 60 tablet 0   ADVIL 200 MG CAPS Take 400 mg by mouth every 6 (six) hours as needed (for mild pain).     albuterol (PROVENTIL) (2.5 MG/3ML) 0.083% nebulizer solution Take 3 mLs (2.5 mg total) by nebulization every 4 (four) hours as needed for wheezing or shortness of breath. 75 mL 1   albuterol (VENTOLIN HFA) 108 (90 Base) MCG/ACT inhaler INHALE 2 PUFFS BY MOUTH EVERY 6 HOURS AS NEEDED FOR WHEEZING OR SHORTNESS OF BREATH 18 g 0   alendronate (FOSAMAX) 70 MG tablet Take 1 tablet (70 mg total) by mouth every 7 (seven) days. Take with a full glass of water on an empty stomach. 12 tablet 0   amphetamine-dextroamphetamine (ADDERALL) 20 MG tablet Take 1 tablet (20 mg total) by mouth 2 (two) times daily. 60 tablet 0   BD VEO INSULIN SYRINGE U/F 31G X 15/64" 1 ML MISC      buPROPion (WELLBUTRIN XL) 300 MG 24 hr tablet Take 1 tablet (300 mg  total) by mouth daily. 90 tablet 3   CALCIUM-VITAMIN D PO Take 1 capsule by mouth in the morning and at bedtime.     carvedilol (COREG) 6.25 MG tablet Take 1 tablet (6.25 mg total) by mouth 2 (two) times daily with a meal. 180 tablet 3   cetirizine (ZYRTEC) 10 MG tablet Take 10 mg by mouth daily as needed for allergies or rhinitis.     diphenhydrAMINE (BENADRYL) 25 mg capsule Take 25-50 mg by mouth every 6 (six) hours as needed for allergies.     esomeprazole (NEXIUM) 20 MG capsule Take 1 capsule (20 mg total) by mouth at bedtime. 90 capsule 3   folic acid (FOLVITE) 1 MG tablet Take 3 mg by mouth See admin  instructions. Take 3 mg by mouth once a day on Wed/Thurs/Fri/Sat     glucose blood (ONETOUCH VERIO) test strip To check glucose daily and prn for diabetes type 2 100 each 3   HUMIRA PEN 40 MG/0.8ML PNKT Inject 40 mg into the skin every Monday.     Insulin Syringe-Needle U-100 (INSULIN SYRINGE 1CC/31GX5/16") 31G X 5/16" 1 ML MISC See admin instructions.     ketorolac (ACULAR) 0.5 % ophthalmic solution Place 1 drop into the left eye 2 (two) times daily.     Lancets (ONETOUCH DELICA PLUS 123XX123) MISC SMARTSIG:1 Lancet(s) Topical Twice Daily PRN     leucovorin (WELLCOVORIN) 5 MG tablet Take 5 mg by mouth See admin instructions. Take 5 mg by mouth once a day on only Sundays and Tuesdays     MAGNESIUM GLYCINATE PO Take 1 capsule by mouth at bedtime.     metFORMIN (GLUCOPHAGE) 1000 MG tablet Take 1 tablet (1,000 mg total) by mouth 2 (two) times daily with a meal. 180 tablet 3   Methotrexate 25 MG/ML SOSY Inject 17.5 mg into the skin every Monday.     mometasone-formoterol (DULERA) 100-5 MCG/ACT AERO Inhale 2 puffs into the lungs 2 (two) times daily. 1 each 5   prednisoLONE acetate (PRED FORTE) 1 % ophthalmic suspension Place 4 drops into the left eye 2 (two) times daily.     rosuvastatin (CRESTOR) 5 MG tablet Take 1 tablet (5 mg total) by mouth every other day. 45 tablet 3   traMADol (ULTRAM) 50 MG tablet Take 50 mg by mouth every 6 (six) hours as needed (for fibromyalgia flares).     No current facility-administered medications on file prior to visit.    Review of Systems  Constitutional:  Positive for fatigue. Negative for activity change, appetite change, fever and unexpected weight change.       Loss of taste  HENT:  Positive for congestion and rhinorrhea. Negative for ear pain, sinus pressure and sore throat.   Eyes:  Negative for pain, redness and visual disturbance.  Respiratory:  Positive for cough. Negative for shortness of breath and wheezing.   Cardiovascular:  Negative for chest pain  and palpitations.  Gastrointestinal:  Negative for abdominal pain, blood in stool, constipation and diarrhea.  Endocrine: Negative for polydipsia and polyuria.  Genitourinary:  Negative for dysuria, frequency and urgency.  Musculoskeletal:  Negative for arthralgias, back pain and myalgias.  Skin:  Negative for pallor and rash.  Allergic/Immunologic: Negative for environmental allergies.  Neurological:  Negative for dizziness, syncope and headaches.  Hematological:  Negative for adenopathy. Does not bruise/bleed easily.  Psychiatric/Behavioral:  Negative for decreased concentration and dysphoric mood. The patient is not nervous/anxious.        Objective:  Physical Exam Constitutional:      General: She is not in acute distress.    Appearance: Normal appearance. She is well-developed. She is obese. She is not ill-appearing or diaphoretic.  HENT:     Head: Normocephalic and atraumatic.     Right Ear: Tympanic membrane and ear canal normal.     Left Ear: Tympanic membrane and ear canal normal.     Nose: Rhinorrhea present.     Comments: Boggy nares     Mouth/Throat:     Mouth: Mucous membranes are moist.     Pharynx: No oropharyngeal exudate or posterior oropharyngeal erythema.  Eyes:     General: No scleral icterus.       Right eye: No discharge.        Left eye: No discharge.     Conjunctiva/sclera: Conjunctivae normal.     Pupils: Pupils are equal, round, and reactive to light.  Neck:     Thyroid: No thyromegaly.     Vascular: No carotid bruit or JVD.  Cardiovascular:     Rate and Rhythm: Normal rate and regular rhythm.     Heart sounds: Normal heart sounds.     No gallop.  Pulmonary:     Effort: Pulmonary effort is normal. No respiratory distress.     Breath sounds: Normal breath sounds. No stridor. No wheezing, rhonchi or rales.  Abdominal:     General: There is no distension or abdominal bruit.     Palpations: Abdomen is soft.  Musculoskeletal:     Cervical back:  Normal range of motion and neck supple.     Right lower leg: No edema.     Left lower leg: No edema.  Lymphadenopathy:     Cervical: No cervical adenopathy.  Skin:    General: Skin is warm and dry.     Coloration: Skin is not pale.     Findings: No rash.  Neurological:     Mental Status: She is alert.     Cranial Nerves: No cranial nerve deficit.     Coordination: Coordination normal.     Deep Tendon Reflexes: Reflexes are normal and symmetric. Reflexes normal.  Psychiatric:        Mood and Affect: Mood normal.           Assessment & Plan:   Problem List Items Addressed This Visit       Cardiovascular and Mediastinum   Aortic atherosclerosis ([place])    Incidental finding on cxr today  Working on bp control  Working on cholesterol control- crestor 5 mg daily and diet       Relevant Medications   chlorthalidone (HYGROTON) 25 MG tablet   Essential hypertension    Not at goal BP: (!) 145/92   Will change hctz 12.5 mg to chlorthalidone 25 mg daily  Rev poss side eff Continue coreg 6.25 mg bid (this can be inc later if needed in future also)    Follow up planned with labs Inst to avoid pseudoephedrine  Eat less processed foods  Work on wt loss when feeling better       Relevant Medications   chlorthalidone (HYGROTON) 25 MG tablet     Respiratory   Post-viral cough syndrome - Primary    From covid  Suspect cyclic cough  Reassuring exam Cxr -no infiltrate/ also reassuring   Px prometh dm to help break cough cycle with caution of sedation  Also tessalon pearles  Fluids/rest Update if not starting to improve in  a week or if worsening        Relevant Orders   DG Chest 2 View (Completed)     Endocrine   Hyperlipidemia associated with type 2 diabetes mellitus ([place])    Disc goals for lipids and reasons to control them Rev last labs with pt Rev low sat fat diet in detail LDL is 83 Goal below 70 Now tolerates crestor 5 mg daily   Recent cxr noted aortic  atherosclerosis-will watch        Relevant Medications   chlorthalidone (HYGROTON) 25 MG tablet   Type 2 diabetes mellitus with hyperglycemia ([facility])    Lab Results  Component Value Date   HGBA1C 7.6 (A) 03/25/2022  This is up in setting of recent illness/ covid Also had an abscess  Some missed med temporarily   Plans to work on better diet and exercise when feeling better  Food is a challenge while she waits for sense of taste to return   Continue metfomrin 1000 mg bid  Nl foot exam  Utd eye care   F/u 3 mo         Relevant Orders   POCT glycosylated hemoglobin (Hb A1C) (Completed)

## 2022-03-25 NOTE — Patient Instructions (Addendum)
Stop hctz Start chlorthalidone 25 mg daily instead   For cough Tessalon pearles  Try prometh DM  - this may sedate   Chest xray today  We will call with the result    You can go back to zytec 10 mg daily for runny nose and drip  Also flonase daily   Watch diet the best you can  Folllow up here in 1-2 weeks for blood pressure (we will discuss the diabetes more at that time)   Drink fluids

## 2022-03-25 NOTE — Assessment & Plan Note (Signed)
Incidental finding on cxr today  Working on bp control  Working on cholesterol control- crestor 5 mg daily and diet

## 2022-03-25 NOTE — Assessment & Plan Note (Signed)
Not at goal BP: (!) 145/92   Will change hctz 12.5 mg to chlorthalidone 25 mg daily  Rev poss side eff Continue coreg 6.25 mg bid (this can be inc later if needed in future also)    Follow up planned with labs Inst to avoid pseudoephedrine  Eat less processed foods  Work on wt loss when feeling better

## 2022-03-25 NOTE — Assessment & Plan Note (Signed)
From covid  Suspect cyclic cough  Reassuring exam Cxr -no infiltrate/ also reassuring   Px prometh dm to help break cough cycle with caution of sedation  Also tessalon pearles  Fluids/rest Update if not starting to improve in a week or if worsening

## 2022-03-25 NOTE — Assessment & Plan Note (Signed)
Lab Results  Component Value Date   HGBA1C 7.6 (A) 03/25/2022   This is up in setting of recent illness/ covid Also had an abscess  Some missed med temporarily   Plans to work on better diet and exercise when feeling better  Food is a challenge while she waits for sense of taste to return   Continue metfomrin 1000 mg bid  Nl foot exam  Utd eye care   F/u 3 mo

## 2022-04-01 ENCOUNTER — Ambulatory Visit (INDEPENDENT_AMBULATORY_CARE_PROVIDER_SITE_OTHER): Payer: Medicare HMO | Admitting: Family Medicine

## 2022-04-01 VITALS — BP 151/85 | HR 102 | Temp 97.4°F | Ht 67.5 in | Wt 194.4 lb

## 2022-04-01 DIAGNOSIS — I10 Essential (primary) hypertension: Secondary | ICD-10-CM | POA: Diagnosis not present

## 2022-04-01 DIAGNOSIS — K219 Gastro-esophageal reflux disease without esophagitis: Secondary | ICD-10-CM | POA: Diagnosis not present

## 2022-04-01 DIAGNOSIS — R058 Other specified cough: Secondary | ICD-10-CM | POA: Diagnosis not present

## 2022-04-01 MED ORDER — CARVEDILOL 12.5 MG PO TABS: 12.5000 mg | ORAL_TABLET | Freq: Two times a day (BID) | ORAL | 0 refills | Status: DC

## 2022-04-01 NOTE — Patient Instructions (Addendum)
Go up on your carvedilol from 6.25 twice daily to 12.5 mg twice daily  So double up on what you have- taking 2 in am and 2 in pm  (it this works we will send in the stronger pill next time)  This should lower blood pressure more and also lower heart rate   Take the cough medicine when not working to see if we can stop the cycle   Keep taking the chlorthalidone for now but we may change it   Follow up in approx 7-10 days   Watch sodium  Drink fluids  Take care of yourself   If any side effects-please call

## 2022-04-01 NOTE — Progress Notes (Signed)
Subjective:    Patient ID: [name], female    DOB: [birth year], 57 y.o.   MRN: [id number]  HPI Pt presents for HTN and chronic medical problems   Wt Readings from Last 3 Encounters:  04/01/22 194 lb 6 oz (88.2 kg)  03/25/22 193 lb 4 oz (87.7 kg)  02/13/22 195 lb 6 oz (88.6 kg)   29.99 kg/m  Vitals:   04/01/22 1415  BP: (!) 152/94  Pulse: (!) 102  Temp: (!) 97.4 F (36.3 C)  SpO2: 97%     HTn BP Readings from Last 3 Encounters:  04/01/22 (!) 152/94  03/25/22 (!) 145/92  02/13/22 (!) 160/92    Last time we changed hctz 12.5 mg to chlorthalidone 25 mg daily  Takes coreg 6.25 mg bid  Bp is still very high at home   Pulse Readings from Last 3 Encounters:  04/01/22 (!) 102  03/25/22 83  02/13/22 80     Intol of amlodipine- caused pedal edema Ace caused cough as well as losartan   Lab Results  Component Value Date   CREATININE 0.80 09/17/2021   BUN 12 09/17/2021   NA 137 09/17/2021   K 4.4 09/17/2021   CL 101 09/17/2021   CO2 24 09/17/2021      Post viral cough syndrome Given prometh dm and tessalon -only tolerates at night  Uses tessalon in the day  Is hit or miss   Does not have any tramadol  It also sedates   Improved but still there  Some phlegm  No wheezing    DG Chest 2 View  Result Date: 03/25/2022 CLINICAL DATA:  ongoing cough after covid 19      seldom productive EXAM: CHEST - 2 VIEW COMPARISON:  Chest x-ray 05/13/2020 FINDINGS: The heart and mediastinal contours are within normal limits. Aortic calcification. No focal consolidation. No pulmonary edema. No pleural effusion. No pneumothorax. No acute osseous abnormality. IMPRESSION: 1. No active cardiopulmonary disease. 2.  Aortic Atherosclerosis (ICD10-I70.0). Electronically Signed   By: Iven Finn M.D.   On: 03/25/2022 08:56    Takes nexium 20 mg for GERD Does not feel any heartburn   Allergy meds  Zyrtec Flonase   Avoiding sudafed   Patient Active Problem List    Diagnosis Date Noted   Post-viral cough syndrome 03/25/2022   Aortic atherosclerosis ([place]) 03/25/2022   Fractures, stress 12/26/2021   Immunosuppressed status ([place]) 11/19/2021   Encounter for screening mammogram for breast cancer 09/24/2021   Osteoporosis 09/24/2021   Estrogen deficiency 09/01/2021   History of DVT (deep vein thrombosis) 07/11/2021   Closed fracture of right tibial plateau 07/10/2021   Sleep apnea 05/14/2021   BMI 30.0-30.9,adult 07/10/2020   Hyperlipidemia associated with type 2 diabetes mellitus ([place]) 07/10/2019   Cystoid macular edema of both eyes 06/16/2019   Vitamin D deficiency 06/16/2019   Type 2 diabetes mellitus with hyperglycemia ([place]) 06/06/2019   Peripheral edema 10/30/2017   Moderate persistent asthma, uncomplicated [id number]   Chronic rhinitis 08/10/2017   Cough 06/06/2017   Welcome to Medicare preventive visit 04/20/2017   Chronic low back pain 12/09/2015   Degeneration of lumbar or lumbosacral intervertebral disc 12/09/2015   Hypothyroid 01/24/2014   Pseudophakia of both eyes 12/15/2013   Nasal septal deviation 01/03/2013   Encounter for Medicare annual wellness exam 11/02/2012   Hydradenitis 11/02/2012   Cystoid macular degeneration of retina 05/13/2012   Macular hole 03/23/2012   Screening-pulmonary TB 03/23/2012   Routine general medical examination  at a health care facility 10/02/2011   Iritis, recurrent 10/02/2011   Tachycardia 10/02/2011   ADD (attention deficit disorder) 08/20/2010   DYSHIDROTIC ECZEMA, HANDS 11/12/2009   Obstructive sleep apnea 11/19/2006   Depression with anxiety 09/29/2006   Essential hypertension 09/29/2006   Seasonal and perennial allergic rhinitis 09/29/2006   Asthma, mild intermittent, well-controlled 09/29/2006   GERD 09/29/2006   IBS 09/29/2006   ENDOMETRIOSIS 09/29/2006   FEMALE INFERTILITY 09/29/2006   Fibromyalgia 09/29/2006   INSOMNIA 09/29/2006   Past Medical History:  Diagnosis Date   ADHD  (attention deficit hyperactivity disorder)    ADD - no meds   Allergy    allergic rhinitis   Anxiety    Asthma    Chronic headaches    Degenerative disc disease    Depression    Diabetes mellitus without complication ([place])    type 2   Eczema    no current problems as of 07/09/21   Fatty liver    Fibromyalgia    Fx of fibula 2009   GERD (gastroesophageal reflux disease)    Hemorrhoids    Hypertension    Hyperthyroidism    Hypothyroidism    IBS (irritable bowel syndrome)    Iritis, chronic    Macular hole of left eye 06/2017   PONV (postoperative nausea and vomiting)    Right leg DVT ([place])    Sleep apnea    uses CPAP nightly   Vitamin D insufficiency 06/16/2019   Past Surgical History:  Procedure Laterality Date   ABDOMINAL HYSTERECTOMY     ANTERIOR CRUCIATE LIGAMENT REPAIR Left 1996   small tear of ACL   BREAST EXCISIONAL BIOPSY Left 1995   BREAST SURGERY     left nipple removed - benign tumor   BUNIONECTOMY Bilateral    COLONOSCOPY     EYE SURGERY     cataracts removed, macular hole repaired left eye- both surgeries done at [facility]  08/2004   endometriosis - several    ORIF TIBIA FRACTURE Right 07/10/2021   Procedure: OPEN REDUCTION INTERNAL FIXATION (ORIF) TIBIAL PLATEAU FRACTURE;  Surgeon: Altamese Rose Farm, MD;  Location: [facility];  Service: Orthopedics;  Laterality: Right;   WISDOM TOOTH EXTRACTION     Social History   Tobacco Use   Smoking status: Never   Smokeless tobacco: Never  Vaping Use   Vaping Use: Never used  Substance Use Topics   Alcohol use: Never    Alcohol/week: 0.0 standard drinks of alcohol   Drug use: No   Family History  Problem Relation Age of Onset   Hypertension Mother    Thyroid disease Mother    Fibromyalgia Mother    Colon polyps Mother    Diabetes Father    Kidney cancer Maternal Grandmother    Colon cancer Neg Hx    Esophageal cancer Neg Hx    Stomach cancer Neg Hx    Rectal cancer Neg Hx    Allergic rhinitis Neg Hx     Angioedema Neg Hx    Asthma Neg Hx    Atopy Neg Hx    Eczema Neg Hx    Immunodeficiency Neg Hx    Urticaria Neg Hx    Allergies  Allergen Reactions   Amlodipine     Pedal edema    Cetirizine Hcl Other (See Comments)    Not effective   Erythromycin Nausea Only and Other (See Comments)    Caused a stomach ache   Escitalopram Oxalate Other (See Comments)    "  No improvement"   Lisinopril Cough   Loratadine Other (See Comments)    "Not effective"   Nifedipine Other (See Comments)    "Fatigue and tremors"   Sertraline Hcl Other (See Comments)    "Did not help"   Vyvanse [Lisdexamfetamine Dimesylate]     Headache and excitablility   Current Outpatient Medications on File Prior to Visit  Medication Sig Dispense Refill   acetaminophen (TYLENOL) 500 MG tablet Take 1 tablet (500 mg total) by mouth every 12 (twelve) hours. 60 tablet 0   ADVIL 200 MG CAPS Take 400 mg by mouth every 6 (six) hours as needed (for mild pain).     albuterol (PROVENTIL) (2.5 MG/3ML) 0.083% nebulizer solution Take 3 mLs (2.5 mg total) by nebulization every 4 (four) hours as needed for wheezing or shortness of breath. 75 mL 1   albuterol (VENTOLIN HFA) 108 (90 Base) MCG/ACT inhaler INHALE 2 PUFFS BY MOUTH EVERY 6 HOURS AS NEEDED FOR WHEEZING OR SHORTNESS OF BREATH 18 g 0   alendronate (FOSAMAX) 70 MG tablet Take 1 tablet (70 mg total) by mouth every 7 (seven) days. Take with a full glass of water on an empty stomach. 12 tablet 0   amphetamine-dextroamphetamine (ADDERALL) 20 MG tablet Take 1 tablet (20 mg total) by mouth 2 (two) times daily. 60 tablet 0   BD VEO INSULIN SYRINGE U/F 31G X 15/64" 1 ML MISC      benzonatate (TESSALON) 200 MG capsule Take 1 capsule (200 mg total) by mouth 3 (three) times daily as needed. 30 capsule 1   buPROPion (WELLBUTRIN XL) 300 MG 24 hr tablet Take 1 tablet (300 mg total) by mouth daily. 90 tablet 3   CALCIUM-VITAMIN D PO Take 1 capsule by mouth in the morning and at bedtime.      cetirizine (ZYRTEC) 10 MG tablet Take 10 mg by mouth daily as needed for allergies or rhinitis.     chlorthalidone (HYGROTON) 25 MG tablet Take 1 tablet (25 mg total) by mouth daily. 90 tablet 0   diphenhydrAMINE (BENADRYL) 25 mg capsule Take 25-50 mg by mouth every 6 (six) hours as needed for allergies.     esomeprazole (NEXIUM) 20 MG capsule Take 1 capsule (20 mg total) by mouth at bedtime. 90 capsule 3   folic acid (FOLVITE) 1 MG tablet Take 3 mg by mouth See admin instructions. Take 3 mg by mouth once a day on Wed/Thurs/Fri/Sat     glucose blood (ONETOUCH VERIO) test strip To check glucose daily and prn for diabetes type 2 100 each 3   HUMIRA PEN 40 MG/0.8ML PNKT Inject 40 mg into the skin every Monday.     Insulin Syringe-Needle U-100 (INSULIN SYRINGE 1CC/31GX5/16") 31G X 5/16" 1 ML MISC See admin instructions.     ketorolac (ACULAR) 0.5 % ophthalmic solution Place 1 drop into the left eye 2 (two) times daily.     Lancets (ONETOUCH DELICA PLUS 123XX123) MISC SMARTSIG:1 Lancet(s) Topical Twice Daily PRN     leucovorin (WELLCOVORIN) 5 MG tablet Take 5 mg by mouth See admin instructions. Take 5 mg by mouth once a day on only Sundays and Tuesdays     MAGNESIUM GLYCINATE PO Take 1 capsule by mouth at bedtime.     metFORMIN (GLUCOPHAGE) 1000 MG tablet Take 1 tablet (1,000 mg total) by mouth 2 (two) times daily with a meal. 180 tablet 3   Methotrexate 25 MG/ML SOSY Inject 17.5 mg into the skin every Monday.  mometasone-formoterol (DULERA) 100-5 MCG/ACT AERO Inhale 2 puffs into the lungs 2 (two) times daily. 1 each 5   prednisoLONE acetate (PRED FORTE) 1 % ophthalmic suspension Place 4 drops into the left eye 2 (two) times daily.     promethazine-dextromethorphan (PROMETHAZINE-DM) 6.25-15 MG/5ML syrup Take 5 mLs by mouth 3 (three) times daily as needed for cough. Caution of sedation 118 mL 0   rosuvastatin (CRESTOR) 5 MG tablet Take 1 tablet (5 mg total) by mouth every other day. 45 tablet 3    traMADol (ULTRAM) 50 MG tablet Take 50 mg by mouth every 6 (six) hours as needed (for fibromyalgia flares).     No current facility-administered medications on file prior to visit.    Review of Systems  Constitutional:  Positive for fatigue. Negative for activity change, appetite change, fever and unexpected weight change.  HENT:  Negative for congestion, ear pain, rhinorrhea, sinus pressure and sore throat.   Eyes:  Negative for pain, redness and visual disturbance.  Respiratory:  Positive for cough. Negative for shortness of breath and wheezing.   Cardiovascular:  Negative for chest pain and palpitations.  Gastrointestinal:  Negative for abdominal pain, blood in stool, constipation and diarrhea.  Endocrine: Negative for polydipsia and polyuria.  Genitourinary:  Negative for dysuria, frequency and urgency.  Musculoskeletal:  Positive for arthralgias and myalgias. Negative for back pain.  Skin:  Negative for pallor and rash.  Allergic/Immunologic: Negative for environmental allergies.  Neurological:  Negative for dizziness, syncope and headaches.  Hematological:  Negative for adenopathy. Does not bruise/bleed easily.  Psychiatric/Behavioral:  Negative for decreased concentration and dysphoric mood. The patient is not nervous/anxious.        Objective:   Physical Exam Constitutional:      General: She is not in acute distress.    Appearance: Normal appearance. She is well-developed. She is obese. She is not ill-appearing or diaphoretic.  HENT:     Head: Normocephalic and atraumatic.     Mouth/Throat:     Mouth: Mucous membranes are moist.  Eyes:     Conjunctiva/sclera: Conjunctivae normal.     Pupils: Pupils are equal, round, and reactive to light.  Neck:     Thyroid: No thyromegaly.     Vascular: No carotid bruit or JVD.  Cardiovascular:     Rate and Rhythm: Normal rate and regular rhythm.     Heart sounds: Normal heart sounds.     No gallop.  Pulmonary:     Effort:  Pulmonary effort is normal. No respiratory distress.     Breath sounds: Normal breath sounds. No stridor. No wheezing, rhonchi or rales.     Comments: Good air exch No wheeze Abdominal:     General: There is no distension or abdominal bruit.     Palpations: Abdomen is soft.  Musculoskeletal:     Cervical back: Normal range of motion and neck supple.     Right lower leg: No edema.     Left lower leg: No edema.  Lymphadenopathy:     Cervical: No cervical adenopathy.  Skin:    General: Skin is warm and dry.     Coloration: Skin is not pale.     Findings: No rash.  Neurological:     Mental Status: She is alert.     Coordination: Coordination normal.     Deep Tendon Reflexes: Reflexes are normal and symmetric. Reflexes normal.  Psychiatric:        Mood and Affect: Mood normal.  Assessment & Plan:   Problem List Items Addressed This Visit       Cardiovascular and Mediastinum   Essential hypertension - Primary    BP: (!) 151/85  Struggling with this  Is up more at home- will bring her cuff to next visit to check accuracy Also getting over a post viral cough   Will inc her carvedilol to 12.5 mg bid (doubling her dose)  Watch bp and pulse at home- inst to call if side eff  Continue chlorthalidone 25 mg daily  F/u in 7-10 d  If not improved at that time would consider change to or addn of spironolactone  Disc lifestyle habits  In past Amlodipine caused edema Losartan and lisinopril caused cough        Relevant Medications   carvedilol (COREG) 12.5 MG tablet     Respiratory   Post-viral cough syndrome    Some improvement  Urged to try taking the prometh dm during the day this wkend since she does not have to work, to try and break the cycle         Digestive   GERD    Taking nexium 20  Pt does not think this is cause of cough No heartburn or indigestion

## 2022-04-01 NOTE — Assessment & Plan Note (Signed)
Taking nexium 20  Pt does not think this is cause of cough No heartburn or indigestion

## 2022-04-01 NOTE — Assessment & Plan Note (Signed)
Some improvement  Urged to try taking the prometh dm during the day this wkend since she does not have to work, to try and break the cycle

## 2022-04-01 NOTE — Assessment & Plan Note (Addendum)
BP: (!) 151/85  Struggling with this  Is up more at home- will bring her cuff to next visit to check accuracy Also getting over a post viral cough   Will inc her carvedilol to 12.5 mg bid (doubling her dose)  Watch bp and pulse at home- inst to call if side eff  Continue chlorthalidone 25 mg daily  F/u in 7-10 d  If not improved at that time would consider change to or addn of spironolactone  Disc lifestyle habits  In past Amlodipine caused edema Losartan and lisinopril caused cough

## 2022-04-06 DIAGNOSIS — G4733 Obstructive sleep apnea (adult) (pediatric): Secondary | ICD-10-CM | POA: Diagnosis not present

## 2022-04-10 ENCOUNTER — Ambulatory Visit (INDEPENDENT_AMBULATORY_CARE_PROVIDER_SITE_OTHER): Payer: Medicare HMO | Admitting: Family Medicine

## 2022-04-10 ENCOUNTER — Encounter: Payer: Self-pay | Admitting: Family Medicine

## 2022-04-10 VITALS — BP 135/85 | HR 85 | Temp 97.8°F | Ht 67.5 in | Wt 192.4 lb

## 2022-04-10 DIAGNOSIS — E785 Hyperlipidemia, unspecified: Secondary | ICD-10-CM | POA: Diagnosis not present

## 2022-04-10 DIAGNOSIS — R058 Other specified cough: Secondary | ICD-10-CM | POA: Diagnosis not present

## 2022-04-10 DIAGNOSIS — E1169 Type 2 diabetes mellitus with other specified complication: Secondary | ICD-10-CM

## 2022-04-10 DIAGNOSIS — I10 Essential (primary) hypertension: Secondary | ICD-10-CM

## 2022-04-10 MED ORDER — ROSUVASTATIN CALCIUM 5 MG PO TABS: 5.0000 mg | ORAL_TABLET | Freq: Every day | ORAL | 3 refills | Status: DC

## 2022-04-10 NOTE — Assessment & Plan Note (Signed)
Now tolerating crestor 5 mg daily  Disc goals for lipids and reasons to control them Rev last labs with pt Rev low sat fat diet in detail  Will re check lipid fasting in about a month

## 2022-04-10 NOTE — Assessment & Plan Note (Addendum)
Bp is improving BP: 135/85  Higher at home-plans to try a diff cuff  Pulse is not too low   Plan to continue Carvedilol 12.5 mg bid   (have room to inc this further in the future if needed since pulse is not low)  Chlorthalidone 25 mg daily  Check lab in a mo   Disc lifestyle change  Enc less processed foods for this and wt loss DASH eating handout given

## 2022-04-10 NOTE — Assessment & Plan Note (Signed)
Improving  Uses prometh dm 1/2 dose at night  Reassuring exam Continue to follow

## 2022-04-10 NOTE — Patient Instructions (Addendum)
Blood pressure is improving  Keep watching it  Use manual cuff if possible Always when relaxed Always with arm supported at heart level    Glad the cough is gradually improving  Work on lifestyle change  Cut back on processed foods Try to get most of your carbohydrates from produce (with the exception of white potatoes)  Eat less bread/pasta/rice/snack foods/cereals/sweets and other items from the middle of the grocery store (processed carbs)

## 2022-04-10 NOTE — Progress Notes (Signed)
Subjective:    Patient ID: [name], female    DOB: 10/23/1965, 57 y.o.   MRN: [id number]  HPI Pt presents for f/u of HTN  Post viral cough  cholesterol  Wt Readings from Last 3 Encounters:  04/10/22 192 lb 6 oz (87.3 kg)  04/01/22 194 lb 6 oz (88.2 kg)  03/25/22 193 lb 4 oz (87.7 kg)   29.69 kg/m  Vitals:   04/10/22 1444  BP: (!) 128/90  Pulse: 85  Temp: 97.8 F (36.6 C)  SpO2: 96%   Her cuff runs higher    Last visit we inc carvedilol to 12.5 mg bid (double prev dose)  Continue chlorthalidone 25 mg daily  Disc lifestyle changes    Cannot tolerate amlodipine or lisinopril /losartan   Cough is improved Took prometh DM  Calming down  Can tol 1/2 dose at night and then go to work in am   Thinks this medicine makes her sweat    BP Readings from Last 3 Encounters:  04/10/22 135/85  04/01/22 (!) 151/85  03/25/22 (!) 145/92    Pulse Readings from Last 3 Encounters:  04/10/22 85  04/01/22 (!) 102  03/25/22 83    At home   153/98 201/115 158/55  Pulse rate in 70s-90s   Hyperlipidemia Lab Results  Component Value Date   CHOL 172 09/17/2021   HDL 55.60 09/17/2021   LDLCALC 83 09/17/2021   TRIG 164.0 (H) 09/17/2021   CHOLHDL 3 09/17/2021   We went up on crestor to 5 mg every day Tolerating this Will re check lipids in about a month    LDL goal is below 70 if possible   Patient Active Problem List   Diagnosis Date Noted   Post-viral cough syndrome 03/25/2022   Aortic atherosclerosis 03/25/2022   Fractures, stress 12/26/2021   Immunosuppressed status 11/19/2021   Encounter for screening mammogram for breast cancer 09/24/2021   Osteoporosis 09/24/2021   Estrogen deficiency 09/01/2021   History of DVT (deep vein thrombosis) 07/11/2021   Closed fracture of right tibial plateau 07/10/2021   Sleep apnea 05/14/2021   BMI 30.0-30.9,adult 07/10/2020   Hyperlipidemia associated with type 2 diabetes mellitus 07/10/2019   Cystoid macular  edema of both eyes 06/16/2019   Vitamin D deficiency 06/16/2019   Type 2 diabetes mellitus with hyperglycemia 06/06/2019   Peripheral edema 10/30/2017   Moderate persistent asthma, uncomplicated 08/10/2017   Chronic rhinitis 08/10/2017   Cough 06/06/2017   Welcome to Medicare preventive visit 04/20/2017   Chronic low back pain 12/09/2015   Degeneration of lumbar or lumbosacral intervertebral disc 12/09/2015   Hypothyroid 01/24/2014   Pseudophakia of both eyes 12/15/2013   Nasal septal deviation 01/03/2013   Encounter for Medicare annual wellness exam 11/02/2012   Hydradenitis 11/02/2012   Cystoid macular degeneration of retina 05/13/2012   Macular hole 03/23/2012   Screening-pulmonary TB 03/23/2012   Routine general medical examination at a health care facility 10/02/2011   Iritis, recurrent 10/02/2011   Tachycardia 10/02/2011   ADD (attention deficit disorder) 08/20/2010   DYSHIDROTIC ECZEMA, HANDS 11/12/2009   Obstructive sleep apnea 11/19/2006   Depression with anxiety 09/29/2006   Essential hypertension 09/29/2006   Seasonal and perennial allergic rhinitis 09/29/2006   Asthma, mild intermittent, well-controlled 09/29/2006   GERD 09/29/2006   IBS 09/29/2006   ENDOMETRIOSIS 09/29/2006   FEMALE INFERTILITY 09/29/2006   Fibromyalgia 09/29/2006   INSOMNIA 09/29/2006   Past Medical History:  Diagnosis Date   ADHD (attention deficit hyperactivity  disorder)    ADD - no meds   Allergy    allergic rhinitis   Anxiety    Asthma    Chronic headaches    Degenerative disc disease    Depression    Diabetes mellitus without complication    type 2   Eczema    no current problems as of 07/09/21   Fatty liver    Fibromyalgia    Fx of fibula 2009   GERD (gastroesophageal reflux disease)    Hemorrhoids    Hypertension    Hyperthyroidism    Hypothyroidism    IBS (irritable bowel syndrome)    Iritis, chronic    Macular hole of left eye 06/2017   PONV (postoperative nausea and  vomiting)    Right leg DVT    Sleep apnea    uses CPAP nightly   Vitamin D insufficiency 06/16/2019   Past Surgical History:  Procedure Laterality Date   ABDOMINAL HYSTERECTOMY     ANTERIOR CRUCIATE LIGAMENT REPAIR Left 1996   small tear of ACL   BREAST EXCISIONAL BIOPSY Left 1995   BREAST SURGERY     left nipple removed - benign tumor   BUNIONECTOMY Bilateral    COLONOSCOPY     EYE SURGERY     cataracts removed, macular hole repaired left eye- both surgeries done at [facility]   LAPAROSCOPY  08/2004   endometriosis - several    ORIF TIBIA FRACTURE Right 07/10/2021   Procedure: OPEN REDUCTION INTERNAL FIXATION (ORIF) TIBIAL PLATEAU FRACTURE;  Surgeon: Myrene Galas, MD;  Location: [facility] OR;  Service: Orthopedics;  Laterality: Right;   WISDOM TOOTH EXTRACTION     Social History   Tobacco Use   Smoking status: Never   Smokeless tobacco: Never  Vaping Use   Vaping Use: Never used  Substance Use Topics   Alcohol use: Never    Alcohol/week: 0.0 standard drinks of alcohol   Drug use: No   Family History  Problem Relation Age of Onset   Hypertension Mother    Thyroid disease Mother    Fibromyalgia Mother    Colon polyps Mother    Diabetes Father    Kidney cancer Maternal Grandmother    Colon cancer Neg Hx    Esophageal cancer Neg Hx    Stomach cancer Neg Hx    Rectal cancer Neg Hx    Allergic rhinitis Neg Hx    Angioedema Neg Hx    Asthma Neg Hx    Atopy Neg Hx    Eczema Neg Hx    Immunodeficiency Neg Hx    Urticaria Neg Hx    Allergies  Allergen Reactions   Amlodipine     Pedal edema    Cetirizine Hcl Other (See Comments)    Not effective   Erythromycin Nausea Only and Other (See Comments)    Caused a stomach ache   Escitalopram Oxalate Other (See Comments)    "No improvement"   Lisinopril Cough   Loratadine Other (See Comments)    "Not effective"   Nifedipine Other (See Comments)    "Fatigue and tremors"   Sertraline Hcl Other (See Comments)    "Did not help"    Vyvanse [Lisdexamfetamine Dimesylate]     Headache and excitablility   Current Outpatient Medications on File Prior to Visit  Medication Sig Dispense Refill   acetaminophen (TYLENOL) 500 MG tablet Take 1 tablet (500 mg total) by mouth every 12 (twelve) hours. 60 tablet 0   ADVIL 200 MG CAPS Take  400 mg by mouth every 6 (six) hours as needed (for mild pain).     albuterol (PROVENTIL) (2.5 MG/3ML) 0.083% nebulizer solution Take 3 mLs (2.5 mg total) by nebulization every 4 (four) hours as needed for wheezing or shortness of breath. 75 mL 1   albuterol (VENTOLIN HFA) 108 (90 Base) MCG/ACT inhaler INHALE 2 PUFFS BY MOUTH EVERY 6 HOURS AS NEEDED FOR WHEEZING OR SHORTNESS OF BREATH 18 g 0   alendronate (FOSAMAX) 70 MG tablet Take 1 tablet (70 mg total) by mouth every 7 (seven) days. Take with a full glass of water on an empty stomach. 12 tablet 0   amphetamine-dextroamphetamine (ADDERALL) 20 MG tablet Take 1 tablet (20 mg total) by mouth 2 (two) times daily. 60 tablet 0   BD VEO INSULIN SYRINGE U/F 31G X 15/64" 1 ML MISC      benzonatate (TESSALON) 200 MG capsule Take 1 capsule (200 mg total) by mouth 3 (three) times daily as needed. 30 capsule 1   buPROPion (WELLBUTRIN XL) 300 MG 24 hr tablet Take 1 tablet (300 mg total) by mouth daily. 90 tablet 3   CALCIUM-VITAMIN D PO Take 1 capsule by mouth in the morning and at bedtime.     carvedilol (COREG) 12.5 MG tablet Take 1 tablet (12.5 mg total) by mouth 2 (two) times daily with a meal. 1 tablet 0   cetirizine (ZYRTEC) 10 MG tablet Take 10 mg by mouth daily as needed for allergies or rhinitis.     chlorthalidone (HYGROTON) 25 MG tablet Take 1 tablet (25 mg total) by mouth daily. 90 tablet 0   diphenhydrAMINE (BENADRYL) 25 mg capsule Take 25-50 mg by mouth every 6 (six) hours as needed for allergies.     esomeprazole (NEXIUM) 20 MG capsule Take 1 capsule (20 mg total) by mouth at bedtime. 90 capsule 3   folic acid (FOLVITE) 1 MG tablet Take 3 mg by mouth  See admin instructions. Take 3 mg by mouth once a day on Wed/Thurs/Fri/Sat     glucose blood (ONETOUCH VERIO) test strip To check glucose daily and prn for diabetes type 2 100 each 3   HUMIRA PEN 40 MG/0.8ML PNKT Inject 40 mg into the skin every Monday.     Insulin Syringe-Needle U-100 (INSULIN SYRINGE 1CC/31GX5/16") 31G X 5/16" 1 ML MISC See admin instructions.     ketorolac (ACULAR) 0.5 % ophthalmic solution Place 1 drop into the left eye 2 (two) times daily.     Lancets (ONETOUCH DELICA PLUS LANCET33G) MISC SMARTSIG:1 Lancet(s) Topical Twice Daily PRN     leucovorin (WELLCOVORIN) 5 MG tablet Take 5 mg by mouth See admin instructions. Take 5 mg by mouth once a day on only Sundays and Tuesdays     MAGNESIUM GLYCINATE PO Take 1 capsule by mouth at bedtime.     metFORMIN (GLUCOPHAGE) 1000 MG tablet Take 1 tablet (1,000 mg total) by mouth 2 (two) times daily with a meal. 180 tablet 3   Methotrexate 25 MG/ML SOSY Inject 17.5 mg into the skin every Monday.     mometasone-formoterol (DULERA) 100-5 MCG/ACT AERO Inhale 2 puffs into the lungs 2 (two) times daily. 1 each 5   prednisoLONE acetate (PRED FORTE) 1 % ophthalmic suspension Place 4 drops into the left eye 2 (two) times daily.     promethazine-dextromethorphan (PROMETHAZINE-DM) 6.25-15 MG/5ML syrup Take 5 mLs by mouth 3 (three) times daily as needed for cough. Caution of sedation 118 mL 0   traMADol (ULTRAM) 50  MG tablet Take 50 mg by mouth every 6 (six) hours as needed (for fibromyalgia flares).     No current facility-administered medications on file prior to visit.    Review of Systems  Constitutional:  Positive for fatigue. Negative for activity change, appetite change, fever and unexpected weight change.  HENT:  Negative for congestion, ear pain, rhinorrhea, sinus pressure and sore throat.   Eyes:  Negative for pain, redness and visual disturbance.  Respiratory:  Positive for cough. Negative for shortness of breath and wheezing.    Cardiovascular:  Negative for chest pain, palpitations and leg swelling.  Gastrointestinal:  Negative for abdominal pain, blood in stool, constipation and diarrhea.  Endocrine: Negative for polydipsia and polyuria.  Genitourinary:  Negative for dysuria, frequency and urgency.  Musculoskeletal:  Negative for arthralgias, back pain and myalgias.  Skin:  Negative for pallor and rash.  Allergic/Immunologic: Negative for environmental allergies.  Neurological:  Negative for dizziness, syncope and headaches.  Hematological:  Negative for adenopathy. Does not bruise/bleed easily.  Psychiatric/Behavioral:  Negative for decreased concentration and dysphoric mood. The patient is not nervous/anxious.        Objective:   Physical Exam Constitutional:      General: She is not in acute distress.    Appearance: Normal appearance. She is well-developed. She is obese. She is not ill-appearing or diaphoretic.  HENT:     Head: Normocephalic and atraumatic.     Mouth/Throat:     Mouth: Mucous membranes are moist.  Eyes:     Conjunctiva/sclera: Conjunctivae normal.     Pupils: Pupils are equal, round, and reactive to light.  Neck:     Thyroid: No thyromegaly.     Vascular: No carotid bruit or JVD.  Cardiovascular:     Rate and Rhythm: Normal rate and regular rhythm.     Heart sounds: Normal heart sounds.     No gallop.  Pulmonary:     Effort: Pulmonary effort is normal. No respiratory distress.     Breath sounds: Normal breath sounds. No stridor. No wheezing, rhonchi or rales.     Comments: Good air exch  Occ cough sounds dry Abdominal:     General: There is no distension or abdominal bruit.     Palpations: Abdomen is soft.  Musculoskeletal:     Cervical back: Normal range of motion and neck supple.     Right lower leg: No edema.     Left lower leg: No edema.  Lymphadenopathy:     Cervical: No cervical adenopathy.  Skin:    General: Skin is warm and dry.     Coloration: Skin is not  pale.     Findings: No rash.  Neurological:     Mental Status: She is alert.     Coordination: Coordination normal.     Deep Tendon Reflexes: Reflexes are normal and symmetric. Reflexes normal.  Psychiatric:        Mood and Affect: Mood normal.           Assessment & Plan:   Problem List Items Addressed This Visit       Cardiovascular and Mediastinum   Essential hypertension - Primary    Bp is improving BP: 135/85  Higher at home-plans to try a diff cuff  Pulse is not too low   Plan to continue Carvedilol 12.5 mg bid   (have room to inc this further in the future if needed since pulse is not low)  Chlorthalidone 25 mg daily  Check lab in a mo   Disc lifestyle change  Enc less processed foods for this and wt loss DASH eating handout given      Relevant Medications   rosuvastatin (CRESTOR) 5 MG tablet     Respiratory   Post-viral cough syndrome    Improving  Uses prometh dm 1/2 dose at night  Reassuring exam Continue to follow        Endocrine   Hyperlipidemia associated with type 2 diabetes mellitus    Now tolerating crestor 5 mg daily  Disc goals for lipids and reasons to control them Rev last labs with pt Rev low sat fat diet in detail  Will re check lipid fasting in about a month        Relevant Medications   rosuvastatin (CRESTOR) 5 MG tablet

## 2022-04-20 ENCOUNTER — Encounter: Payer: Self-pay | Admitting: *Deleted

## 2022-05-05 ENCOUNTER — Other Ambulatory Visit: Payer: Self-pay | Admitting: Family Medicine

## 2022-05-06 DIAGNOSIS — G4733 Obstructive sleep apnea (adult) (pediatric): Secondary | ICD-10-CM | POA: Diagnosis not present

## 2022-05-11 ENCOUNTER — Other Ambulatory Visit (INDEPENDENT_AMBULATORY_CARE_PROVIDER_SITE_OTHER): Payer: Medicare HMO

## 2022-05-11 DIAGNOSIS — I10 Essential (primary) hypertension: Secondary | ICD-10-CM

## 2022-05-11 DIAGNOSIS — E785 Hyperlipidemia, unspecified: Secondary | ICD-10-CM | POA: Diagnosis not present

## 2022-05-11 DIAGNOSIS — E1169 Type 2 diabetes mellitus with other specified complication: Secondary | ICD-10-CM

## 2022-05-11 LAB — COMPREHENSIVE METABOLIC PANEL
ALT: 17 U/L (ref 0–35)
AST: 34 U/L (ref 0–37)
Albumin: 3.9 g/dL (ref 3.5–5.2)
Alkaline Phosphatase: 71 U/L (ref 39–117)
BUN: 10 mg/dL (ref 6–23)
CO2: 22 mEq/L (ref 19–32)
Calcium: 9.1 mg/dL (ref 8.4–10.5)
Chloride: 103 mEq/L (ref 96–112)
Creatinine, Ser: 0.84 mg/dL (ref 0.40–1.20)
GFR: 78.52 mL/min (ref >60.00)
Glucose, Bld: 172 mg/dL — ABNORMAL HIGH (ref 70–99)
Potassium: 4 mEq/L (ref 3.5–5.1)
Sodium: 139 mEq/L (ref 135–145)
Total Bilirubin: 0.5 mg/dL (ref 0.2–1.2)
Total Protein: 6.9 g/dL (ref 6.0–8.3)

## 2022-05-11 LAB — LIPID PANEL
Cholesterol: 111 mg/dL (ref 0–200)
HDL: 43.7 mg/dL (ref >39.00)
LDL Cholesterol: 46 mg/dL (ref 0–99)
NonHDL: 67.12
Total CHOL/HDL Ratio: 3
Triglycerides: 105 mg/dL (ref 0.0–149.0)
VLDL: 21 mg/dL (ref 0.0–40.0)

## 2022-05-27 ENCOUNTER — Ambulatory Visit: Payer: Medicare HMO | Admitting: Allergy & Immunology

## 2022-05-27 ENCOUNTER — Other Ambulatory Visit: Payer: Self-pay

## 2022-05-27 ENCOUNTER — Encounter: Payer: Self-pay | Admitting: Allergy & Immunology

## 2022-05-27 VITALS — BP 152/104 | HR 106 | Temp 98.2°F | Resp 16 | Ht 67.25 in | Wt 197.4 lb

## 2022-05-27 DIAGNOSIS — J4540 Moderate persistent asthma, uncomplicated: Secondary | ICD-10-CM | POA: Diagnosis not present

## 2022-05-27 DIAGNOSIS — J310 Chronic rhinitis: Secondary | ICD-10-CM | POA: Diagnosis not present

## 2022-05-27 DIAGNOSIS — D849 Immunodeficiency, unspecified: Secondary | ICD-10-CM | POA: Diagnosis not present

## 2022-05-27 MED ORDER — BUDESONIDE-FORMOTEROL FUMARATE 160-4.5 MCG/ACT IN AERO: 2.0000 | INHALATION_SPRAY | Freq: Two times a day (BID) | RESPIRATORY_TRACT | 5 refills | Status: DC | PRN

## 2022-05-27 NOTE — Patient Instructions (Addendum)
1. Moderate persistent asthma, uncomplicated - Lung testing looks great today. - We are going to send in Symbicort to help with your symptoms for flares.   - I asked to keep it on file.  - Daily controller medication(s): NONE - Prior to physical activity: albuterol 2 puffs 10-15 minutes before physical activity. - Rescue medications: albuterol 4 puffs every 4-6 hours as needed or albuterol nebulizer one vial every 4-6 hours as needed  - During period of respiratory distress: start Symbicort 160/4.51mcg two puffs twice daily with spacer for 1-2 weeks - Asthma control goals:  * Full participation in all desired activities (may need albuterol before activity) * Albuterol use two time or less a week on average (not counting use with activity) * Cough interfering with sleep two time or less a month * Oral steroids no more than once a year * No hospitalizations  2. Chronic rhinitis  - Continue with cetirizine 10mg  daily as needed - Continue with Flonase Sensmist 1-2 sprays per nostril daily as needed.   3. Return in about 1 year (around 05/27/2023).    Please inform us of any Emergency Department visits, hospitalizations, or changes in symptoms. Call us before going to the ED for breathing or allergy symptoms since we might be able to fit you in for a sick visit. Feel free to contact us anytime with any questions, problems, or concerns.  It was a pleasure to see you again today!  Websites that have reliable patient information: 1. American Academy of Asthma, Allergy, and Immunology: www.aaaai.org 2. Food Allergy Research and Education (FARE): foodallergy.org 3. Mothers of Asthmatics: http://www.asthmacommunitynetwork.org 4. American College of Allergy, Asthma, and Immunology: www.acaai.org   COVID-19 Vaccine Information can be found at: [email or web address].[email or web address] For questions related to vaccine distribution or appointments, please email  [email or web address] or call [phone].     "Like" [place] on Facebook and Instagram for our latest updates!        Make sure you are registered to vote! If you have moved or changed any of your contact information, you will need to get this updated before voting!  In some cases, you MAY be able to register to vote online: [email or web address]

## 2022-05-27 NOTE — Progress Notes (Unsigned)
FOLLOW UP  Date of Service/Encounter:  05/27/22   Assessment:   Moderate persistent asthma, uncomplicated   Chronic rhinitis - s/p 2 rounds of allergen immunotherapy    Complicated past medical history including fibromyalgia, uveitis, and panniculitis   Immunosuppressed state (methotrexate and Humira) for treatment of recurrent bilateral iritis and uveitis   Type 2 diabetes   Plan/Recommendations:    Patient Instructions  1. Moderate persistent asthma, uncomplicated - Lung testing looks great today. - We are going to send in Symbicort to help with your symptoms for flares.   - I asked to keep it on file.  - Daily controller medication(s): NONE - Prior to physical activity: albuterol 2 puffs 10-15 minutes before physical activity. - Rescue medications: albuterol 4 puffs every 4-6 hours as needed or albuterol nebulizer one vial every 4-6 hours as needed  - During period of respiratory distress: start Symbicort 160/4.40mcg two puffs twice daily with spacer for 1-2 weeks - Asthma control goals:  * Full participation in all desired activities (may need albuterol before activity) * Albuterol use two time or less a week on average (not counting use with activity) * Cough interfering with sleep two time or less a month * Oral steroids no more than once a year * No hospitalizations  2. Chronic rhinitis  - Continue with cetirizine 10mg  daily as needed - Continue with Flonase Sensmist 1-2 sprays per nostril daily as needed.   3. Return in about 1 year (around 05/27/2023).    Please inform us of any Emergency Department visits, hospitalizations, or changes in symptoms. Call us before going to the ED for breathing or allergy symptoms since we might be able to fit you in for a sick visit. Feel free to contact us anytime with any questions, problems, or concerns.  It was a pleasure to see you again today!  Websites that have reliable patient information: 1. American Academy of  Asthma, Allergy, and Immunology: www.aaaai.org 2. Food Allergy Research and Education (FARE): foodallergy.org 3. Mothers of Asthmatics: http://www.asthmacommunitynetwork.org 4. American College of Allergy, Asthma, and Immunology: www.acaai.org   COVID-19 Vaccine Information can be found at: [email or web address].nl For questions related to vaccine distribution or appointments, please email [email or web address] or call [phone].     "Like" [place] on Facebook and Instagram for our latest updates!        Make sure you are registered to vote! If you have moved or changed any of your contact information, you will need to get this updated before voting!  In some cases, you MAY be able to register to vote online: [email or web address]         Subjective:   [name] is a 57 y.o. female presenting today for follow up of  Chief Complaint  Patient presents with   Follow-up    Had some issues due to Covid in the beginning of march with her breathing but is doing better now.    Allergic Rhinitis     Itchy eyes    [name] has a history of the following: Patient Active Problem List   Diagnosis Date Noted   Post-viral cough syndrome 03/25/2022   Aortic atherosclerosis (HCC) 03/25/2022   Fractures, stress 12/26/2021   Immunosuppressed status (HCC) 11/19/2021   Encounter for screening mammogram for breast cancer 09/24/2021   Osteoporosis 09/24/2021   Estrogen deficiency 09/01/2021   History of DVT (deep vein thrombosis) 07/11/2021   Closed fracture of right tibial plateau 07/10/2021   Sleep apnea 05/14/2021  BMI 30.0-30.9,adult 07/10/2020   Hyperlipidemia associated with type 2 diabetes mellitus (HCC) 07/10/2019   Cystoid macular edema of both eyes 06/16/2019   Vitamin D deficiency 06/16/2019   Type 2 diabetes mellitus with hyperglycemia (HCC) 06/06/2019   Peripheral edema  10/30/2017   Moderate persistent asthma, uncomplicated 08/10/2017   Chronic rhinitis 08/10/2017   Cough 06/06/2017   Welcome to Medicare preventive visit 04/20/2017   Chronic low back pain 12/09/2015   Degeneration of lumbar or lumbosacral intervertebral disc 12/09/2015   Hypothyroid 01/24/2014   Pseudophakia of both eyes 12/15/2013   Nasal septal deviation 01/03/2013   Encounter for Medicare annual wellness exam 11/02/2012   Hydradenitis 11/02/2012   Cystoid macular degeneration of retina 05/13/2012   Macular hole 03/23/2012   Screening-pulmonary TB 03/23/2012   Routine general medical examination at a health care facility 10/02/2011   Iritis, recurrent 10/02/2011   Tachycardia 10/02/2011   ADD (attention deficit disorder) 08/20/2010   DYSHIDROTIC ECZEMA, HANDS 11/12/2009   Obstructive sleep apnea 11/19/2006   Depression with anxiety 09/29/2006   Essential hypertension 09/29/2006   Seasonal and perennial allergic rhinitis 09/29/2006   Asthma, mild intermittent, well-controlled 09/29/2006   GERD 09/29/2006   IBS 09/29/2006   ENDOMETRIOSIS 09/29/2006   FEMALE INFERTILITY 09/29/2006   Fibromyalgia 09/29/2006   INSOMNIA 09/29/2006    History obtained from: chart review and {Persons; PED relatives w/patient:19415::"patient"}.  [name] is a 57 y.o. female presenting for {Blank single:19197::"a food challenge","a drug challenge","skin testing","a sick visit","an evaluation of ***","a follow up visit"}.  She was last seen in November 2023 by Thurston Hole one of our nurse practitioners.  At that time, we continue with albuterol 2 puffs every 4-6 hours as needed with Dulera added during flares.  For her rhinitis, we continue with Flonase as well as nasal saline rinses.  Since last visit,  Asthma/Respiratory Symptom History: She did have some problems in March when she had COVID. Since then she has done well. She just rode it out and did fine. She did not get Paxlovid.  She has not picked up her  Elwin Sleight because her insurance stopped covering it, but she has not needed it at all.   Allergic Rhinitis Symptom History: She has been using cetirizine. She had a couple of nights when her eyes were bad.   {Blank single:19197::"Food Allergy Symptom History: ***"," "}  {Blank single:19197::"Skin Symptom History: ***"," "}  {Blank single:19197::"GERD Symptom History: ***"," "}  She still has uveitis which seems to be stable. She typically does not know that it is bad until she gets this evaluated by the Ophtamologist. She remains on her Humira for her uveitis and MTX. These dose have bene stable.   She is followed by Dr. Everlena Cooper (ophthamologist) and Dr. Kellie Shropshire (Rheumatologist) at [facility].   Otherwise, there have been no changes to her past medical history, surgical history, family history, or social history.    ROS     Objective:   Blood pressure (!) 152/104, pulse (!) 106, temperature 98.2 F (36.8 C), resp. rate 16, height 5' 7.25" (1.708 m), weight 197 lb 6 oz (89.5 kg), last menstrual period 08/05/2006, SpO2 95 %. Body mass index is 30.68 kg/m.    Physical Exam   Diagnostic studies:    Spirometry: results normal (FEV1: 2.38/82%, FVC: 3.07/83%, FEV1/FVC: 78%).    Spirometry consistent with normal pattern. {Blank single:19197::"Albuterol/Atrovent nebulizer","Xopenex/Atrovent nebulizer","Albuterol nebulizer","Albuterol four puffs via MDI","Xopenex four puffs via MDI"} treatment given in clinic with {Blank single:19197::"significant improvement in FEV1  per ATS criteria","significant improvement in FVC per ATS criteria","significant improvement in FEV1 and FVC per ATS criteria","improvement in FEV1, but not significant per ATS criteria","improvement in FVC, but not significant per ATS criteria","improvement in FEV1 and FVC, but not significant per ATS criteria","no improvement"}.  Allergy Studies: {Blank single:19197::"none","labs sent instead"," "}    {Blank  single:19197::"Allergy testing results were read and interpreted by myself, documented by clinical staff."," "}      Malachi Bonds, MD  [facility]

## 2022-05-28 ENCOUNTER — Encounter: Payer: Self-pay | Admitting: Allergy & Immunology

## 2022-06-04 DIAGNOSIS — G4733 Obstructive sleep apnea (adult) (pediatric): Secondary | ICD-10-CM | POA: Diagnosis not present

## 2022-06-11 ENCOUNTER — Telehealth: Payer: Self-pay | Admitting: Pharmacist

## 2022-06-11 DIAGNOSIS — I10 Essential (primary) hypertension: Secondary | ICD-10-CM

## 2022-06-11 DIAGNOSIS — E1165 Type 2 diabetes mellitus with hyperglycemia: Secondary | ICD-10-CM

## 2022-06-11 DIAGNOSIS — J4520 Mild intermittent asthma, uncomplicated: Secondary | ICD-10-CM

## 2022-06-11 DIAGNOSIS — M8000XD Age-related osteoporosis with current pathological fracture, unspecified site, subsequent encounter for fracture with routine healing: Secondary | ICD-10-CM

## 2022-06-11 NOTE — Telephone Encounter (Signed)
PharmD reviewed patient chart to assess eligibility for [facility] services. Patient was determined to be a good candidate for the program given the complexity of the medication regimen and/or overall risk for hospitalization and increased utilization.  Referral entered in order to outreach patient and offer appointment with PharmD. Referral cosigned to PCP.  

## 2022-06-16 ENCOUNTER — Other Ambulatory Visit: Payer: Self-pay | Admitting: Family Medicine

## 2022-06-26 ENCOUNTER — Encounter (HOSPITAL_COMMUNITY): Payer: Self-pay | Admitting: Emergency Medicine

## 2022-06-26 ENCOUNTER — Emergency Department (HOSPITAL_COMMUNITY)
Admission: EM | Admit: 2022-06-26 | Discharge: 2022-06-26 | Disposition: A | Discharge status: Home / Self Care | Payer: Medicare HMO | Attending: Emergency Medicine | Admitting: Emergency Medicine

## 2022-06-26 ENCOUNTER — Other Ambulatory Visit: Payer: Self-pay

## 2022-06-26 ENCOUNTER — Telehealth: Payer: Self-pay | Admitting: Family Medicine

## 2022-06-26 DIAGNOSIS — J4540 Moderate persistent asthma, uncomplicated: Secondary | ICD-10-CM | POA: Diagnosis not present

## 2022-06-26 DIAGNOSIS — E039 Hypothyroidism, unspecified: Secondary | ICD-10-CM | POA: Insufficient documentation

## 2022-06-26 DIAGNOSIS — N390 Urinary tract infection, site not specified: Secondary | ICD-10-CM | POA: Diagnosis not present

## 2022-06-26 DIAGNOSIS — E119 Type 2 diabetes mellitus without complications: Secondary | ICD-10-CM | POA: Diagnosis not present

## 2022-06-26 DIAGNOSIS — R42 Dizziness and giddiness: Secondary | ICD-10-CM | POA: Diagnosis not present

## 2022-06-26 DIAGNOSIS — Z7984 Long term (current) use of oral hypoglycemic drugs: Secondary | ICD-10-CM | POA: Insufficient documentation

## 2022-06-26 DIAGNOSIS — I10 Essential (primary) hypertension: Secondary | ICD-10-CM | POA: Diagnosis not present

## 2022-06-26 DIAGNOSIS — J45909 Unspecified asthma, uncomplicated: Secondary | ICD-10-CM | POA: Insufficient documentation

## 2022-06-26 DIAGNOSIS — E860 Dehydration: Secondary | ICD-10-CM | POA: Diagnosis not present

## 2022-06-26 DIAGNOSIS — Z79899 Other long term (current) drug therapy: Secondary | ICD-10-CM | POA: Insufficient documentation

## 2022-06-26 DIAGNOSIS — Z7951 Long term (current) use of inhaled steroids: Secondary | ICD-10-CM | POA: Diagnosis not present

## 2022-06-26 DIAGNOSIS — J4520 Mild intermittent asthma, uncomplicated: Secondary | ICD-10-CM | POA: Insufficient documentation

## 2022-06-26 LAB — CBC
HCT: 40.8 % (ref 36.0–46.0)
Hemoglobin: 13.3 g/dL (ref 12.0–15.0)
MCH: 28.9 pg (ref 26.0–34.0)
MCHC: 32.6 g/dL (ref 30.0–36.0)
MCV: 88.5 fL (ref 80.0–100.0)
Platelets: 244 10*3/uL (ref 150–400)
RBC: 4.61 MIL/uL (ref 3.87–5.11)
RDW: 14.7 % (ref 11.5–15.5)
WBC: 9 10*3/uL (ref 4.0–10.5)
nRBC: 0.2 % (ref 0.0–0.2)

## 2022-06-26 LAB — URINALYSIS, ROUTINE W REFLEX MICROSCOPIC
Bilirubin Urine: NEGATIVE
Glucose, UA: NEGATIVE mg/dL
Hgb urine dipstick: NEGATIVE
Ketones, ur: NEGATIVE mg/dL
Nitrite: NEGATIVE
Protein, ur: NEGATIVE mg/dL
Specific Gravity, Urine: 1.014 (ref 1.005–1.030)
pH: 5 (ref 5.0–8.0)

## 2022-06-26 LAB — BASIC METABOLIC PANEL
Anion gap: 12 (ref 5–15)
BUN: 15 mg/dL (ref 6–20)
CO2: 22 mmol/L (ref 22–32)
Calcium: 9 mg/dL (ref 8.9–10.3)
Chloride: 102 mmol/L (ref 98–111)
Creatinine, Ser: 1.33 mg/dL — ABNORMAL HIGH (ref 0.44–1.00)
GFR, Estimated: 47 mL/min — ABNORMAL LOW (ref >60)
Glucose, Bld: 203 mg/dL — ABNORMAL HIGH (ref 70–99)
Potassium: 4 mmol/L (ref 3.5–5.1)
Sodium: 136 mmol/L (ref 135–145)

## 2022-06-26 LAB — CBG MONITORING, ED: Glucose-Capillary: 191 mg/dL — ABNORMAL HIGH (ref 70–99)

## 2022-06-26 MED ORDER — FLUCONAZOLE 150 MG PO TABS: 150.0000 mg | ORAL_TABLET | Freq: Once | ORAL | 0 refills | Status: DC | PRN

## 2022-06-26 MED ORDER — CEFDINIR 300 MG PO CAPS: 300.0000 mg | ORAL_CAPSULE | Freq: Two times a day (BID) | ORAL | 0 refills | Status: AC

## 2022-06-26 MED ORDER — SODIUM CHLORIDE 0.9 % IV BOLUS
1000.0000 mL | Freq: Once | INTRAVENOUS | Status: AC
  Administered 2022-06-26: 1000 mL via INTRAVENOUS

## 2022-06-26 NOTE — Telephone Encounter (Signed)
FYI: This call has been transferred to Access Nurse. Once the result note has been entered staff can address the message at that time.  Patient called in with the following symptoms:  Red Word:dizziness  and b/p of 160/101,b/s 150   Please advise at Mobile [phone] (mobile)  Message is routed to Provider Pool and [facility] Triage

## 2022-06-26 NOTE — ED Notes (Signed)
See triage notes. Pt states no longer has headache. States only sx is her dizziness that is worse with closing eye. NIH 0 and pupils perrla. A/o. Mm wet.

## 2022-06-26 NOTE — ED Triage Notes (Signed)
Pt via POV c/o dizziness and hypertension with BP 160/101 at home. Pt called her doctor to schedule an appointment and was advised to come to ER. Pt has a posterior headache relieved with advil, denies blurry vision. No n/v/diaphoresis. PMH includes DM2, HTN, fibromyalgia, asthma.

## 2022-06-26 NOTE — ED Provider Notes (Signed)
[place] [facility]GENC[facility] DEPARTMENT AT [facility] Provider Note  CSN: [id number] Arrival date & time: 06/26/22 1448  Chief Complaint(s) Dizziness and Hypertension  HPI [name] is a 57 y.o. female with history of diabetes, hypertension presenting to the emergency department with lightheadedness.  Patient reports lightheadedness starting today.  She also reports that she had a mild headache in the back of her head earlier today which is resolved after taking some Motrin.  No numbness or tingling.  No vertiginous symptoms such as spinning sensation or abnormal gait.  No nausea or vomiting, chest pain, abdominal pain, back pain.  No focal weakness.  No urinary symptoms or dysuria.  No cough.  Denies similar episode in the past.  She reports that she was going to schedule follow-up with her primary doctor, when she talk to them on the phone the triage nurse advised her to come to the emergency department as her blood pressure was elevated.   Past Medical History Past Medical History:  Diagnosis Date   ADHD (attention deficit hyperactivity disorder)    ADD - no meds   Allergy    allergic rhinitis   Anxiety    Asthma    Chronic headaches    Degenerative disc disease    Depression    Diabetes mellitus without complication (HCC)    type 2   Eczema    no current problems as of 07/09/21   Fatty liver    Fibromyalgia    Fx of fibula 2009   GERD (gastroesophageal reflux disease)    Hemorrhoids    Hypertension    Hyperthyroidism    Hypothyroidism    IBS (irritable bowel syndrome)    Iritis, chronic    Macular hole of left eye 06/2017   PONV (postoperative nausea and vomiting)    Right leg DVT (HCC)    Sleep apnea    uses CPAP nightly   Vitamin D insufficiency 06/16/2019   Patient Active Problem List   Diagnosis Date Noted   Post-viral cough syndrome 03/25/2022   Aortic atherosclerosis (HCC) 03/25/2022   Fractures, stress 12/26/2021   Immunosuppressed status (HCC)  11/19/2021   Encounter for screening mammogram for breast cancer 09/24/2021   Osteoporosis 09/24/2021   Estrogen deficiency 09/01/2021   History of DVT (deep vein thrombosis) 07/11/2021   Closed fracture of right tibial plateau 07/10/2021   Sleep apnea 05/14/2021   BMI 30.0-30.9,adult 07/10/2020   Hyperlipidemia associated with type 2 diabetes mellitus (HCC) 07/10/2019   Cystoid macular edema of both eyes 06/16/2019   Vitamin D deficiency 06/16/2019   Type 2 diabetes mellitus with hyperglycemia (HCC) 06/06/2019   Peripheral edema 10/30/2017   Moderate persistent asthma, uncomplicated 08/10/2017   Chronic rhinitis 08/10/2017   Cough 06/06/2017   Welcome to Medicare preventive visit 04/20/2017   Chronic low back pain 12/09/2015   Degeneration of lumbar or lumbosacral intervertebral disc 12/09/2015   Hypothyroid 01/24/2014   Pseudophakia of both eyes 12/15/2013   Nasal septal deviation 01/03/2013   Encounter for Medicare annual wellness exam 11/02/2012   Hydradenitis 11/02/2012   Cystoid macular degeneration of retina 05/13/2012   Macular hole 03/23/2012   Screening-pulmonary TB 03/23/2012   Routine general medical examination at a health care facility 10/02/2011   Iritis, recurrent 10/02/2011   Tachycardia 10/02/2011   ADD (attention deficit disorder) 08/20/2010   DYSHIDROTIC ECZEMA, HANDS 11/12/2009   Obstructive sleep apnea 11/19/2006   Depression with anxiety 09/29/2006   Essential hypertension 09/29/2006   Seasonal and  perennial allergic rhinitis 09/29/2006   Asthma, mild intermittent, well-controlled 09/29/2006   GERD 09/29/2006   IBS 09/29/2006   ENDOMETRIOSIS 09/29/2006   FEMALE INFERTILITY 09/29/2006   Fibromyalgia 09/29/2006   INSOMNIA 09/29/2006   Home Medication(s) Prior to Admission medications   Medication Sig Start Date End Date Taking? Authorizing Provider  cefdinir (OMNICEF) 300 MG capsule Take 1 capsule (300 mg total) by mouth 2 (two) times daily for 7  days. 06/26/22 07/03/22 Yes Lonell Grandchild, MD  methotrexate 50 MG/2ML injection SMARTSIG:0.9 Milliliter(s) SUB-Q Once a Week 04/30/22  Yes [provider]  acetaminophen (TYLENOL) 500 MG tablet Take 1 tablet (500 mg total) by mouth every 12 (twelve) hours. 07/11/21   Montez Morita, PA-C  ADVIL 200 MG CAPS Take 400 mg by mouth every 6 (six) hours as needed (for mild pain).    [provider]  albuterol (PROVENTIL) (2.5 MG/3ML) 0.083% nebulizer solution Take 3 mLs (2.5 mg total) by nebulization every 4 (four) hours as needed for wheezing or shortness of breath. 02/26/21   Alfonse Spruce, MD  albuterol (VENTOLIN HFA) 108 (90 Base) MCG/ACT inhaler INHALE 2 PUFFS BY MOUTH EVERY 6 HOURS AS NEEDED FOR WHEEZING OR SHORTNESS OF BREATH 10/30/21   Alfonse Spruce, MD  amphetamine-dextroamphetamine (ADDERALL) 20 MG tablet Take 1 tablet (20 mg total) by mouth 2 (two) times daily. 02/13/22   Tower, Audrie Gallus, MD  BD VEO INSULIN SYRINGE U/F 31G X 15/64" 1 ML MISC  05/12/21   [provider]  budesonide-formoterol (SYMBICORT) 160-4.5 MCG/ACT inhaler Inhale 2 puffs into the lungs 2 (two) times daily as needed. 05/27/22   Alfonse Spruce, MD  buPROPion (WELLBUTRIN XL) 300 MG 24 hr tablet Take 1 tablet (300 mg total) by mouth daily. 09/24/21   Tower, Audrie Gallus, MD  CALCIUM-VITAMIN D PO Take 1 capsule by mouth in the morning and at bedtime.    [provider]  carvedilol (COREG) 12.5 MG tablet Take 1 tablet (12.5 mg total) by mouth 2 (two) times daily with a meal. 04/01/22   Tower, Audrie Gallus, MD  cetirizine (ZYRTEC) 10 MG tablet Take 10 mg by mouth daily as needed for allergies or rhinitis.    [provider]  chlorthalidone (HYGROTON) 25 MG tablet Take 1 tablet by mouth once daily 06/16/22   Tower, Audrie Gallus, MD  diphenhydrAMINE (BENADRYL) 25 mg capsule Take 25-50 mg by mouth every 6 (six) hours as needed for allergies.    [provider]  esomeprazole (NEXIUM) 20  MG capsule Take 1 capsule (20 mg total) by mouth at bedtime. 09/24/21   Tower, Audrie Gallus, MD  folic acid (FOLVITE) 1 MG tablet Take 3 mg by mouth See admin instructions. Take 3 mg by mouth once a day on Wed/Thurs/Fri/Sat    [provider]  glucose blood (ONETOUCH VERIO) test strip USE  STRIP TO CHECK GLUCOSE TWICE DAILY AND AS NEEDED FOR DIABETES 05/05/22   Tower, Audrie Gallus, MD  HUMIRA PEN 40 MG/0.8ML PNKT Inject 40 mg into the skin every Monday. 04/30/21   [provider]  ketorolac (ACULAR) 0.5 % ophthalmic solution Place 1 drop into the left eye 2 (two) times daily. 11/24/18   [provider]  Lancets (ONETOUCH DELICA PLUS Collier Bullock) MISC SMARTSIG:1 Lancet(s) Topical Twice Daily PRN 08/22/19   [provider]  leucovorin (WELLCOVORIN) 5 MG tablet Take 5 mg by mouth See admin instructions. Take 5 mg by mouth once a day on only Sundays and  Tuesdays    [provider]  MAGNESIUM GLYCINATE PO Take 1 capsule by mouth at bedtime.    [provider]  metFORMIN (GLUCOPHAGE) 1000 MG tablet Take 1 tablet (1,000 mg total) by mouth 2 (two) times daily with a meal. 09/24/21   Tower, Audrie Gallus, MD  Methotrexate 25 MG/ML SOSY Inject 17.5 mg into the skin every Monday.    [provider]  prednisoLONE acetate (PRED FORTE) 1 % ophthalmic suspension Place 4 drops into the left eye 2 (two) times daily.    [provider]  rosuvastatin (CRESTOR) 5 MG tablet Take 1 tablet (5 mg total) by mouth daily. 04/10/22   Tower, Audrie Gallus, MD  traMADol (ULTRAM) 50 MG tablet Take 50 mg by mouth every 6 (six) hours as needed (for fibromyalgia flares).    [provider]                                                                                                                                    Past Surgical History Past Surgical History:  Procedure Laterality Date   ABDOMINAL HYSTERECTOMY     ANTERIOR CRUCIATE LIGAMENT REPAIR Left 1996   small tear of ACL    BREAST EXCISIONAL BIOPSY Left 1995   BREAST SURGERY     left nipple removed - benign tumor   BUNIONECTOMY Bilateral    COLONOSCOPY     EYE SURGERY     cataracts removed, macular hole repaired left eye- both surgeries done at [facility]   LAPAROSCOPY  08/2004   endometriosis - several    ORIF TIBIA FRACTURE Right 07/10/2021   Procedure: OPEN REDUCTION INTERNAL FIXATION (ORIF) TIBIAL PLATEAU FRACTURE;  Surgeon: Myrene Galas, MD;  Location: [facility] OR;  Service: Orthopedics;  Laterality: Right;   WISDOM TOOTH EXTRACTION     Family History Family History  Problem Relation Age of Onset   Hypertension Mother    Thyroid disease Mother    Fibromyalgia Mother    Colon polyps Mother    Diabetes Father    Kidney cancer Maternal Grandmother    Colon cancer Neg Hx    Esophageal cancer Neg Hx    Stomach cancer Neg Hx    Rectal cancer Neg Hx    Allergic rhinitis Neg Hx    Angioedema Neg Hx    Asthma Neg Hx    Atopy Neg Hx    Eczema Neg Hx    Immunodeficiency Neg Hx    Urticaria Neg Hx     Social History Social History   Tobacco Use   Smoking status: Never   Smokeless tobacco: Never  Vaping Use   Vaping Use: Never used  Substance Use Topics   Alcohol use: Never    Alcohol/week: 0.0 standard drinks of alcohol   Drug use: No   Allergies Amlodipine, Cetirizine hcl, Erythromycin, Escitalopram oxalate, Lisinopril, Loratadine, Nifedipine, Sertraline hcl, and Vyvanse [lisdexamfetamine dimesylate]  Review of  Systems Review of Systems  All other systems reviewed and are negative.   Physical Exam Vital Signs  I have reviewed the triage vital signs BP (!) 156/92   Pulse 75   Temp 99 F (37.2 C)   Resp 14   Ht 5\' 7"  (1.702 m)   Wt 89.4 kg   LMP 08/05/2006   SpO2 97%   BMI 30.85 kg/m  Physical Exam Vitals and nursing note reviewed.  Constitutional:      General: She is not in acute distress.    Appearance: She is well-developed.  HENT:     Head: Normocephalic and atraumatic.      Mouth/Throat:     Mouth: Mucous membranes are moist.  Eyes:     Pupils: Pupils are equal, round, and reactive to light.  Cardiovascular:     Rate and Rhythm: Normal rate and regular rhythm.     Heart sounds: No murmur heard. Pulmonary:     Effort: Pulmonary effort is normal. No respiratory distress.     Breath sounds: Normal breath sounds.  Abdominal:     General: Abdomen is flat.     Palpations: Abdomen is soft.     Tenderness: There is no abdominal tenderness.  Musculoskeletal:        General: No tenderness.     Right lower leg: No edema.     Left lower leg: No edema.  Skin:    General: Skin is warm and dry.  Neurological:     General: No focal deficit present.     Mental Status: She is alert. Mental status is at baseline.     Comments: Cranial nerves II through XII intact, strength 5 out of 5 in the bilateral upper and lower extremities, no sensory deficit to light touch, no dysmetria on finger-nose-finger testing, ambulatory with steady gait.  Psychiatric:        Mood and Affect: Mood normal.        Behavior: Behavior normal.     ED Results and Treatments Labs (all labs ordered are listed, but only abnormal results are displayed) Labs Reviewed  BASIC METABOLIC PANEL - Abnormal; Notable for the following components:      Result Value   Glucose, Bld 203 (*)    Creatinine, Ser 1.33 (*)    GFR, Estimated 47 (*)    All other components within normal limits  URINALYSIS, ROUTINE W REFLEX MICROSCOPIC - Abnormal; Notable for the following components:   APPearance HAZY (*)    Leukocytes,Ua MODERATE (*)    Bacteria, UA RARE (*)    All other components within normal limits  CBG MONITORING, ED - Abnormal; Notable for the following components:   Glucose-Capillary 191 (*)    All other components within normal limits  URINE CULTURE  CBC                                                                                                                          Radiology  No  results found.  Pertinent labs & imaging results that were available during my care of the patient were reviewed by me and considered in my medical decision making (see MDM for details).  Medications Ordered in ED Medications  sodium chloride 0.9 % bolus 1,000 mL (0 mLs Intravenous Stopped 06/26/22 1716)                                                                                                                                     Procedures Procedures  (including critical care time)  Medical Decision Making / ED Course   MDM:  57 year old presenting to the emergency department with lightheadedness and generalized weakness.  Patient overall well-appearing, physical exam reassuring with normal findings including normal neurologic exam.  Symptoms could be due to mild hyperglycemia and dehydration, electrolyte abnormality, so we will check basic labs.  No chest pain and EKG reassuring, extremely low concern for occult cardiopulmonary process.  Lungs clear on exam without focal finding.  Doubt occult infection, no fevers or chills, abdominal pain, dysuria.  Reports mild headache earlier today which is resolved after dose of Motrin and her neurologic exam is unremarkable, extremely low concern for intracranial process such as meningitis, tumor, stroke, bleeding.  She reports lightheadedness but denies any vertiginous symptoms and her cerebellar neurologic exam is normal.  Will give small dose of fluids given lightheadedness, check labs, reassess.  If workup is reassuring anticipate likely discharge with primary care doctor follow-up.  Clinical Course as of 06/26/22 1821  Fri Jun 26, 2022  1546 Creatinine(!): 1.33 Patient does have small AKI. Symptoms most likely due to dehydration. Will re-assess after fluids. [WS]  1819 Urinalysis does show signs of urinary infection.  Patient not specifically having any dysuria but given her generalized weakness and AKI we will treat with cefdinir.  Will  send culture.  If negative likely patient can discontinue antibiotics.  Advise close follow-up with her primary physician for recheck of her laboratory testing.  Suspect ultimate cause of her lightheadedness and generalized weakness is dehydration.  She does feel better after fluids. Will discharge patient to home. All questions answered. Patient comfortable with plan of discharge. Return precautions discussed with patient and specified on the after visit summary.  [WS]    Clinical Course User Index [WS] Lonell Grandchild, MD     Additional history obtained: -External records from outside source obtained and reviewed including: Chart review including previous notes, labs, imaging, consultation notes including triage nursing notes from earlier today   Lab Tests: -I ordered, reviewed, and interpreted labs.   The pertinent results include:   Labs Reviewed  BASIC METABOLIC PANEL - Abnormal; Notable for the following components:      Result Value   Glucose, Bld 203 (*)    Creatinine, Ser 1.33 (*)    GFR, Estimated 47 (*)    All other components within normal limits  URINALYSIS, ROUTINE  W REFLEX MICROSCOPIC - Abnormal; Notable for the following components:   APPearance HAZY (*)    Leukocytes,Ua MODERATE (*)    Bacteria, UA RARE (*)    All other components within normal limits  CBG MONITORING, ED - Abnormal; Notable for the following components:   Glucose-Capillary 191 (*)    All other components within normal limits  URINE CULTURE  CBC    Notable for signs of UTI, mild hyperglycemia, mild AKI c/w dehydration  EKG   EKG Interpretation  Date/Time:  Friday June 26 2022 15:14:25 EDT Ventricular Rate:  82 PR Interval:  133 QRS Duration: 87 QT Interval:  379 QTC Calculation: 443 R Axis:   46 Text Interpretation: Sinus rhythm Probable anteroseptal infarct, old Confirmed by Alvino Blood ([id number]) on 06/26/2022 3:32:54 PM         Medicines ordered and prescription drug  management: Meds ordered this encounter  Medications   sodium chloride 0.9 % bolus 1,000 mL   cefdinir (OMNICEF) 300 MG capsule    Sig: Take 1 capsule (300 mg total) by mouth 2 (two) times daily for 7 days.    Dispense:  14 capsule    Refill:  0    -I have reviewed the patients home medicines and have made adjustments as needed  Cardiac Monitoring: The patient was maintained on a cardiac monitor.  I personally viewed and interpreted the cardiac monitored which showed an underlying rhythm of: NSR  Social Determinants of Health:  Diagnosis or treatment significantly limited by social determinants of health: obesity   Reevaluation: After the interventions noted above, I reevaluated the patient and found that their symptoms have improved  Co morbidities that complicate the patient evaluation  Past Medical History:  Diagnosis Date   ADHD (attention deficit hyperactivity disorder)    ADD - no meds   Allergy    allergic rhinitis   Anxiety    Asthma    Chronic headaches    Degenerative disc disease    Depression    Diabetes mellitus without complication (HCC)    type 2   Eczema    no current problems as of 07/09/21   Fatty liver    Fibromyalgia    Fx of fibula 2009   GERD (gastroesophageal reflux disease)    Hemorrhoids    Hypertension    Hyperthyroidism    Hypothyroidism    IBS (irritable bowel syndrome)    Iritis, chronic    Macular hole of left eye 06/2017   PONV (postoperative nausea and vomiting)    Right leg DVT (HCC)    Sleep apnea    uses CPAP nightly   Vitamin D insufficiency 06/16/2019      Dispostion: Disposition decision including need for hospitalization was considered, and patient discharged from emergency department.    Final Clinical Impression(s) / ED Diagnoses Final diagnoses:  Dehydration  Urinary tract infection without hematuria, site unspecified     This chart was dictated using voice recognition software.  Despite best efforts to  proofread,  errors can occur which can change the documentation meaning.    Lonell Grandchild, MD 06/26/22 [phone]

## 2022-06-26 NOTE — Discharge Instructions (Addendum)
We evaluated you for your weakness and lightheadedness.  Your laboratory testing shows a mild kidney injury, a sign of dehydration.  You received IV fluids in the emergency department.  Your urinalysis showed evidence of urinary infection.  We do not always treat urinary infections without urinary symptoms, but given your weakness and fatigue as well as your dehydration, I think it is best to start antibiotics.  Please be sure to stay hydrated.  Follow-up closely with your primary doctor so they can recheck your laboratory testing.  Please return to the emergency department if you develop any new or worsening symptoms such as loss of consciousness, difficulty breathing, chest pain, abdominal pain, fainting, vomiting, leg swelling, severe headaches, confusion, or any other concerning symptoms.

## 2022-06-26 NOTE — Telephone Encounter (Signed)
Per chart review tab pt is at [facility] ED. Sending note to Dr Milinda Antis.and [employer]

## 2022-06-26 NOTE — Telephone Encounter (Signed)
Aware, will watch for correspondence  

## 2022-06-28 LAB — URINE CULTURE: Culture: <10000 — AB

## 2022-07-15 DIAGNOSIS — L720 Epidermal cyst: Secondary | ICD-10-CM | POA: Diagnosis not present

## 2022-07-15 DIAGNOSIS — L821 Other seborrheic keratosis: Secondary | ICD-10-CM | POA: Diagnosis not present

## 2022-07-15 DIAGNOSIS — D2262 Melanocytic nevi of left upper limb, including shoulder: Secondary | ICD-10-CM | POA: Diagnosis not present

## 2022-07-15 DIAGNOSIS — D2271 Melanocytic nevi of right lower limb, including hip: Secondary | ICD-10-CM | POA: Diagnosis not present

## 2022-07-15 DIAGNOSIS — D2261 Melanocytic nevi of right upper limb, including shoulder: Secondary | ICD-10-CM | POA: Diagnosis not present

## 2022-07-15 DIAGNOSIS — D225 Melanocytic nevi of trunk: Secondary | ICD-10-CM | POA: Diagnosis not present

## 2022-07-15 DIAGNOSIS — Z85828 Personal history of other malignant neoplasm of skin: Secondary | ICD-10-CM | POA: Diagnosis not present

## 2022-07-29 ENCOUNTER — Encounter: Payer: Self-pay | Admitting: Family Medicine

## 2022-07-29 ENCOUNTER — Ambulatory Visit (INDEPENDENT_AMBULATORY_CARE_PROVIDER_SITE_OTHER): Payer: Medicare HMO | Admitting: Family Medicine

## 2022-07-29 VITALS — BP 144/88 | HR 80 | Temp 97.8°F | Ht 67.0 in | Wt 194.5 lb

## 2022-07-29 DIAGNOSIS — M797 Fibromyalgia: Secondary | ICD-10-CM

## 2022-07-29 DIAGNOSIS — M51379 Other intervertebral disc degeneration, lumbosacral region without mention of lumbar back pain or lower extremity pain: Secondary | ICD-10-CM

## 2022-07-29 DIAGNOSIS — G8929 Other chronic pain: Secondary | ICD-10-CM | POA: Diagnosis not present

## 2022-07-29 DIAGNOSIS — M5137 Other intervertebral disc degeneration, lumbosacral region: Secondary | ICD-10-CM

## 2022-07-29 DIAGNOSIS — M7918 Myalgia, other site: Secondary | ICD-10-CM | POA: Insufficient documentation

## 2022-07-29 DIAGNOSIS — M545 Low back pain, unspecified: Secondary | ICD-10-CM

## 2022-07-29 MED ORDER — METHOCARBAMOL 500 MG PO TABS: 500.0000 mg | ORAL_TABLET | Freq: Three times a day (TID) | ORAL | 3 refills | Status: DC | PRN

## 2022-07-29 MED ORDER — TRAMADOL HCL 50 MG PO TABS: 50.0000 mg | ORAL_TABLET | Freq: Four times a day (QID) | ORAL | 0 refills | Status: DC | PRN

## 2022-07-29 MED ORDER — PREDNISONE 20 MG PO TABS
ORAL_TABLET | ORAL | 0 refills | Status: DC
Start: 2022-07-29 — End: 2022-08-18

## 2022-07-29 NOTE — Assessment & Plan Note (Signed)
No neuro symptoms with back pain flare today  Discussed what to watch for

## 2022-07-29 NOTE — Patient Instructions (Addendum)
Take the prednisone taper  Use some gentle heat for 10 minutes at a time  If ice feels good=that is ok as well   I put the referral in for PT  Please let us know if you don't hear in 1 week   Tramadol as needed with caution of sedation and habit  Methocarbamol is ok as needed as well   Take a look at the handouts re : piriformis

## 2022-07-29 NOTE — Assessment & Plan Note (Signed)
Acute flare of left lower back pain (worsened by myofascial pain disorder)  More issues in piriformis / SI area today but no neuro changes  Worst with flexion and left sided lateral bend   Discussed treatment with  Tramadol prn  Heat/ice  PT ordered-eval and treatment  Prednisone 40 mg -taper to 20 mg (6 days)-discussed side effects Handout re: piriformis strain and some rehab stretches   Call back and Er precautions noted in detail today

## 2022-07-29 NOTE — Progress Notes (Signed)
Subjective:    Patient ID: [name], female    DOB: [birth year], 57 y.o.   MRN: [id number]  HPI  Wt Readings from Last 3 Encounters:  07/29/22 194 lb 8 oz (88.2 kg)  06/26/22 197 lb (89.4 kg)  05/27/22 197 lb 6 oz (89.5 kg)   30.46 kg/m  Vitals:   07/29/22 1352 07/29/22 1416  BP: (!) 156/94 (!) 144/88  Pulse: 80   Temp: 97.8 F (36.6 C)   SpO2: 98%     Pt presents to discuss back pain and disability status   Worse back trouble lately , about 3 weeks ago     (caring for mother who had a fracture- she has to help her get to standing and get into wheelchair)  Low back Left side of her buttocks   Not shooting to her leg    No pain relief at worst with oxy and muscle relaxer Tried heat and ice  Some stretching   Worst to stand and walk (seizes up or grabs her)  Cannot lie on left side-wakes her up   Pillow under or between knees does not help  Flex of hips -no improvement   History of chronic back pain/ upper lumbar  DDD Scoliosis  Has episodes of spasm  PT was years ago including aquatic therapy   TENS  Tramadol (out of )   had oxy left over from past knee  Muscle relaxer  Had oxycodone on hand   Also history of OSTEOPOROSIS   Also history of fibromyalgia for which she has disability    Working part time  Works 3 hours at a time  Sitting at [employer] / not heavy lifting   Wants to keep working part time if she can      Patient Active Problem List   Diagnosis Date Noted   Piriformis muscle pain 07/29/2022   Post-viral cough syndrome 03/25/2022   Aortic atherosclerosis (HCC) 03/25/2022   Fractures, stress 12/26/2021   Immunosuppressed status (HCC) 11/19/2021   Encounter for screening mammogram for breast cancer 09/24/2021   Osteoporosis 09/24/2021   Estrogen deficiency 09/01/2021   History of DVT (deep vein thrombosis) 07/11/2021   Closed fracture of right tibial plateau 07/10/2021   Sleep apnea 05/14/2021   BMI 30.0-30.9,adult 07/10/2020    Hyperlipidemia associated with type 2 diabetes mellitus (HCC) 07/10/2019   Cystoid macular edema of both eyes 06/16/2019   Vitamin D deficiency 06/16/2019   Type 2 diabetes mellitus with hyperglycemia (HCC) 06/06/2019   Peripheral edema 10/30/2017   Moderate persistent asthma, uncomplicated 08/10/2017   Chronic rhinitis 08/10/2017   Cough 06/06/2017   Welcome to Medicare preventive visit 04/20/2017   Chronic low back pain 12/09/2015   Degeneration of lumbar or lumbosacral intervertebral disc 12/09/2015   Hypothyroid 01/24/2014   Pseudophakia of both eyes 12/15/2013   Nasal septal deviation 01/03/2013   Encounter for Medicare annual wellness exam 11/02/2012   Hydradenitis 11/02/2012   Cystoid macular degeneration of retina 05/13/2012   Macular hole 03/23/2012   Screening-pulmonary TB 03/23/2012   Routine general medical examination at a health care facility 10/02/2011   Iritis, recurrent 10/02/2011   Tachycardia 10/02/2011   ADD (attention deficit disorder) 08/20/2010   DYSHIDROTIC ECZEMA, HANDS 11/12/2009   Obstructive sleep apnea 11/19/2006   Depression with anxiety 09/29/2006   Essential hypertension 09/29/2006   Seasonal and perennial allergic rhinitis 09/29/2006   Asthma, mild intermittent, well-controlled 09/29/2006   GERD 09/29/2006   IBS 09/29/2006  ENDOMETRIOSIS 09/29/2006   FEMALE INFERTILITY 09/29/2006   Fibromyalgia 09/29/2006   INSOMNIA 09/29/2006   Past Medical History:  Diagnosis Date   ADHD (attention deficit hyperactivity disorder)    ADD - no meds   Allergy    allergic rhinitis   Anxiety    Asthma    Chronic headaches    Degenerative disc disease    Depression    Diabetes mellitus without complication (HCC)    type 2   Eczema    no current problems as of 07/09/21   Fatty liver    Fibromyalgia    Fx of fibula 2009   GERD (gastroesophageal reflux disease)    Hemorrhoids    Hypertension    Hyperthyroidism    Hypothyroidism    IBS (irritable  bowel syndrome)    Iritis, chronic    Macular hole of left eye 06/2017   PONV (postoperative nausea and vomiting)    Right leg DVT (HCC)    Sleep apnea    uses CPAP nightly   Vitamin D insufficiency 06/16/2019   Past Surgical History:  Procedure Laterality Date   ABDOMINAL HYSTERECTOMY     ANTERIOR CRUCIATE LIGAMENT REPAIR Left 1996   small tear of ACL   BREAST EXCISIONAL BIOPSY Left 1995   BREAST SURGERY     left nipple removed - benign tumor   BUNIONECTOMY Bilateral    COLONOSCOPY     EYE SURGERY     cataracts removed, macular hole repaired left eye- both surgeries done at [facility]   LAPAROSCOPY  08/2004   endometriosis - several    ORIF TIBIA FRACTURE Right 07/10/2021   Procedure: OPEN REDUCTION INTERNAL FIXATION (ORIF) TIBIAL PLATEAU FRACTURE;  Surgeon: Myrene Galas, MD;  Location: [facility] OR;  Service: Orthopedics;  Laterality: Right;   WISDOM TOOTH EXTRACTION     Social History   Tobacco Use   Smoking status: Never   Smokeless tobacco: Never  Vaping Use   Vaping status: Never Used  Substance Use Topics   Alcohol use: Never    Alcohol/week: 0.0 standard drinks of alcohol   Drug use: No   Family History  Problem Relation Age of Onset   Hypertension Mother    Thyroid disease Mother    Fibromyalgia Mother    Colon polyps Mother    Diabetes Father    Kidney cancer Maternal Grandmother    Colon cancer Neg Hx    Esophageal cancer Neg Hx    Stomach cancer Neg Hx    Rectal cancer Neg Hx    Allergic rhinitis Neg Hx    Angioedema Neg Hx    Asthma Neg Hx    Atopy Neg Hx    Eczema Neg Hx    Immunodeficiency Neg Hx    Urticaria Neg Hx    Allergies  Allergen Reactions   Amlodipine     Pedal edema    Cetirizine Hcl Other (See Comments)    Not effective   Erythromycin Nausea Only and Other (See Comments)    Caused a stomach ache   Escitalopram Oxalate Other (See Comments)    "No improvement"   Lisinopril Cough   Loratadine Other (See Comments)    "Not effective"    Nifedipine Other (See Comments)    "Fatigue and tremors"   Sertraline Hcl Other (See Comments)    "Did not help"   Vyvanse [Lisdexamfetamine Dimesylate]     Headache and excitablility   Current Outpatient Medications on File Prior to Visit  Medication Sig  Dispense Refill   acetaminophen (TYLENOL) 500 MG tablet Take 1 tablet (500 mg total) by mouth every 12 (twelve) hours. 60 tablet 0   ADVIL 200 MG CAPS Take 400 mg by mouth every 6 (six) hours as needed (for mild pain).     albuterol (PROVENTIL) (2.5 MG/3ML) 0.083% nebulizer solution Take 3 mLs (2.5 mg total) by nebulization every 4 (four) hours as needed for wheezing or shortness of breath. 75 mL 1   albuterol (VENTOLIN HFA) 108 (90 Base) MCG/ACT inhaler INHALE 2 PUFFS BY MOUTH EVERY 6 HOURS AS NEEDED FOR WHEEZING OR SHORTNESS OF BREATH 18 g 0   amphetamine-dextroamphetamine (ADDERALL) 20 MG tablet Take 1 tablet (20 mg total) by mouth 2 (two) times daily. 60 tablet 0   BD VEO INSULIN SYRINGE U/F 31G X 15/64" 1 ML MISC      budesonide-formoterol (SYMBICORT) 160-4.5 MCG/ACT inhaler Inhale 2 puffs into the lungs 2 (two) times daily as needed. 1 each 5   buPROPion (WELLBUTRIN XL) 300 MG 24 hr tablet Take 1 tablet (300 mg total) by mouth daily. 90 tablet 3   CALCIUM-VITAMIN D PO Take 1 capsule by mouth in the morning and at bedtime.     carvedilol (COREG) 12.5 MG tablet Take 1 tablet (12.5 mg total) by mouth 2 (two) times daily with a meal. 1 tablet 0   cetirizine (ZYRTEC) 10 MG tablet Take 10 mg by mouth daily as needed for allergies or rhinitis.     chlorthalidone (HYGROTON) 25 MG tablet Take 1 tablet by mouth once daily 90 tablet 0   diphenhydrAMINE (BENADRYL) 25 mg capsule Take 25-50 mg by mouth every 6 (six) hours as needed for allergies.     esomeprazole (NEXIUM) 20 MG capsule Take 1 capsule (20 mg total) by mouth at bedtime. 90 capsule 3   fluconazole (DIFLUCAN) 150 MG tablet Take 1 tablet (150 mg total) by mouth once as needed for up to 1  dose (yeast infection). 1 tablet 0   folic acid (FOLVITE) 1 MG tablet Take 3 mg by mouth See admin instructions. Take 3 mg by mouth once a day on Wed/Thurs/Fri/Sat     glucose blood (ONETOUCH VERIO) test strip USE  STRIP TO CHECK GLUCOSE TWICE DAILY AND AS NEEDED FOR DIABETES 100 each 1   HUMIRA PEN 40 MG/0.8ML PNKT Inject 40 mg into the skin every Monday.     ketorolac (ACULAR) 0.5 % ophthalmic solution Place 1 drop into the left eye 2 (two) times daily.     Lancets (ONETOUCH DELICA PLUS LANCET33G) MISC SMARTSIG:1 Lancet(s) Topical Twice Daily PRN     leucovorin (WELLCOVORIN) 5 MG tablet Take 5 mg by mouth See admin instructions. Take 5 mg by mouth once a day on only Sundays and Tuesdays     MAGNESIUM GLYCINATE PO Take 1 capsule by mouth at bedtime.     metFORMIN (GLUCOPHAGE) 1000 MG tablet Take 1 tablet (1,000 mg total) by mouth 2 (two) times daily with a meal. 180 tablet 3   Methotrexate 25 MG/ML SOSY Inject 17.5 mg into the skin every Monday.     methotrexate 50 MG/2ML injection SMARTSIG:0.9 Milliliter(s) SUB-Q Once a Week     prednisoLONE acetate (PRED FORTE) 1 % ophthalmic suspension Place 4 drops into the left eye 2 (two) times daily.     rosuvastatin (CRESTOR) 5 MG tablet Take 1 tablet (5 mg total) by mouth daily. 90 tablet 3   No current facility-administered medications on file prior to visit.  Review of Systems  Constitutional:  Positive for fatigue. Negative for activity change, appetite change, fever and unexpected weight change.  HENT:  Negative for congestion, ear pain, rhinorrhea, sinus pressure and sore throat.   Eyes:  Negative for pain, redness and visual disturbance.  Respiratory:  Negative for cough, shortness of breath and wheezing.   Cardiovascular:  Negative for chest pain and palpitations.  Gastrointestinal:  Negative for abdominal pain, blood in stool, constipation and diarrhea.  Endocrine: Negative for polydipsia and polyuria.  Genitourinary:  Negative for  dysuria, frequency and urgency.  Musculoskeletal:  Positive for arthralgias, back pain and myalgias.  Skin:  Negative for pallor and rash.  Allergic/Immunologic: Negative for environmental allergies.  Neurological:  Negative for dizziness, syncope, weakness, numbness and headaches.  Hematological:  Negative for adenopathy. Does not bruise/bleed easily.  Psychiatric/Behavioral:  Negative for decreased concentration and dysphoric mood. The patient is not nervous/anxious.        Objective:   Physical Exam Constitutional:      General: She is not in acute distress.    Appearance: Normal appearance. She is well-developed. She is obese. She is not ill-appearing or diaphoretic.  HENT:     Head: Normocephalic and atraumatic.  Eyes:     General: No scleral icterus.    Conjunctiva/sclera: Conjunctivae normal.     Pupils: Pupils are equal, round, and reactive to light.  Cardiovascular:     Rate and Rhythm: Normal rate and regular rhythm.  Pulmonary:     Effort: Pulmonary effort is normal.     Breath sounds: Normal breath sounds. No wheezing or rales.  Abdominal:     General: Bowel sounds are normal. There is no distension.     Palpations: Abdomen is soft.     Tenderness: There is no abdominal tenderness.  Musculoskeletal:        General: Tenderness present.     Cervical back: Normal range of motion and neck supple.     Lumbar back: Spasms and tenderness present. No swelling, edema or bony tenderness. Decreased range of motion. Negative right straight leg raise test and negative left straight leg raise test. Scoliosis present.     Comments: Mild scoliosis Some tenderness over left SI area  Left lumbar muscles are tight  Flex 30 deg Ext 10 deg  Pain with left lat bend   No bony spinous process tenderness  Some scattered myofascial trigger points   Lymphadenopathy:     Cervical: No cervical adenopathy.  Skin:    General: Skin is warm and dry.     Coloration: Skin is not pale.      Findings: No erythema or rash.  Neurological:     Mental Status: She is alert.     Cranial Nerves: No cranial nerve deficit.     Sensory: No sensory deficit.     Motor: No atrophy or abnormal muscle tone.     Coordination: Coordination normal.     Deep Tendon Reflexes: Reflexes are normal and symmetric.     Comments: Negative SLR  Psychiatric:        Mood and Affect: Mood normal.           Assessment & Plan:   Problem List Items Addressed This Visit       Musculoskeletal and Integument   Degeneration of lumbar or lumbosacral intervertebral disc    No neuro symptoms with back pain flare today  Discussed what to watch for       Relevant Medications  traMADol (ULTRAM) 50 MG tablet   predniSONE (DELTASONE) 20 MG tablet   methocarbamol (ROBAXIN) 500 MG tablet     Other   Piriformis muscle pain    Flared by low back problems Handouts/rehab given Referred to PT See a/p for low back pain       Relevant Orders   Ambulatory referral to Physical Therapy   Fibromyalgia    Continues part time work  Disabled from full time work and heavy lifting       Relevant Medications   traMADol (ULTRAM) 50 MG tablet   predniSONE (DELTASONE) 20 MG tablet   methocarbamol (ROBAXIN) 500 MG tablet   Chronic low back pain - Primary    Acute flare of left lower back pain (worsened by myofascial pain disorder)  More issues in piriformis / SI area today but no neuro changes  Worst with flexion and left sided lateral bend   Discussed treatment with  Tramadol prn  Heat/ice  PT ordered-eval and treatment  Prednisone 40 mg -taper to 20 mg (6 days)-discussed side effects Handout re: piriformis strain and some rehab stretches   Call back and Er precautions noted in detail today         Relevant Medications   traMADol (ULTRAM) 50 MG tablet   predniSONE (DELTASONE) 20 MG tablet   methocarbamol (ROBAXIN) 500 MG tablet   Other Relevant Orders   Ambulatory referral to Physical Therapy

## 2022-07-29 NOTE — Assessment & Plan Note (Signed)
Continues part time work  Disabled from full time work and heavy lifting

## 2022-07-29 NOTE — Assessment & Plan Note (Signed)
Flared by low back problems Handouts/rehab given Referred to PT See a/p for low back pain

## 2022-08-06 ENCOUNTER — Ambulatory Visit: Payer: Medicare HMO

## 2022-08-06 VITALS — Ht 67.5 in | Wt 195.0 lb

## 2022-08-06 DIAGNOSIS — Z1231 Encounter for screening mammogram for malignant neoplasm of breast: Secondary | ICD-10-CM | POA: Diagnosis not present

## 2022-08-06 DIAGNOSIS — Z0000 Encounter for general adult medical examination without abnormal findings: Secondary | ICD-10-CM | POA: Diagnosis not present

## 2022-08-06 NOTE — Patient Instructions (Signed)
Ms. [name] , Thank you for taking time to come for your Medicare Wellness Visit. I appreciate your ongoing commitment to your health goals. Please review the following plan we discussed and let me know if I can assist you in the future.   Referrals/Orders/Follow-Ups/Clinician Recommendations: Aim for 30 minutes of exercise or brisk walking, 6-8 glasses of water, and 5 servings of fruits and vegetables each day.   Managing Pain Without Opioids Opioids are strong medicines used to treat moderate to severe pain. For some people, especially those who have long-term (chronic) pain, opioids may not be the best choice for pain management due to: Side effects like nausea, constipation, and sleepiness. The risk of addiction (opioid use disorder). The longer you take opioids, the greater your risk of addiction. Pain that lasts for more than 3 months is called chronic pain. Managing chronic pain usually requires more than one approach and is often provided by a team of health care providers working together (multidisciplinary approach). Pain management may be done at a pain management center or pain clinic. How to manage pain without the use of opioids Use non-opioid medicines Non-opioid medicines for pain may include: Over-the-counter or prescription non-steroidal anti-inflammatory drugs (NSAIDs). These may be the first medicines used for pain. They work well for muscle and bone pain, and they reduce swelling. Acetaminophen. This over-the-counter medicine may work well for milder pain but not swelling. Antidepressants. These may be used to treat chronic pain. A certain type of antidepressant (tricyclics) is often used. These medicines are given in lower doses for pain than when used for depression. Anticonvulsants. These are usually used to treat seizures but may also reduce nerve (neuropathic) pain. Muscle relaxants. These relieve pain caused by sudden muscle tightening (spasms). You may also use a pain  medicine that is applied to the skin as a patch, cream, or gel (topical analgesic), such as a numbing medicine. These may cause fewer side effects than medicines taken by mouth. Do certain therapies as directed Some therapies can help with pain management. They include: Physical therapy. You will do exercises to gain strength and flexibility. A physical therapist may teach you exercises to move and stretch parts of your body that are weak, stiff, or painful. You can learn these exercises at physical therapy visits and practice them at home. Physical therapy may also involve: Massage. Heat wraps or applying heat or cold to affected areas. Electrical signals that interrupt pain signals (transcutaneous electrical nerve stimulation, TENS). Weak lasers that reduce pain and swelling (low-level laser therapy). Signals from your body that help you learn to regulate pain (biofeedback). Occupational therapy. This helps you to learn ways to function at home and work with less pain. Recreational therapy. This involves trying new activities or hobbies, such as a physical activity or drawing. Mental health therapy, including: Cognitive behavioral therapy (CBT). This helps you learn coping skills for dealing with pain. Acceptance and commitment therapy (ACT) to change the way you think and react to pain. Relaxation therapies, including muscle relaxation exercises and mindfulness-based stress reduction. Pain management counseling. This may be individual, family, or group counseling.  Receive medical treatments Medical treatments for pain management include: Nerve block injections. These may include a pain blocker and anti-inflammatory medicines. You may have injections: Near the spine to relieve chronic back or neck pain. Into joints to relieve back or joint pain. Into nerve areas that supply a painful area to relieve body pain. Into muscles (trigger point injections) to relieve some painful muscle  conditions. A medical device placed near your spine to help block pain signals and relieve nerve pain or chronic back pain (spinal cord stimulation device). Acupuncture. Follow these instructions at home Medicines Take over-the-counter and prescription medicines only as told by your health care provider. If you are taking pain medicine, ask your health care providers about possible side effects to watch out for. Do not drive or use heavy machinery while taking prescription opioid pain medicine. Lifestyle  Do not use drugs or alcohol to reduce pain. If you drink alcohol, limit how much you have to: 0-1 drink a day for women who are not pregnant. 0-2 drinks a day for men. Know how much alcohol is in a drink. In the [place] one drink equals one 12 oz bottle of beer (355 mL), one 5 oz glass of wine (148 mL), or one 1 oz glass of hard liquor (44 mL). Do not use any products that contain nicotine or tobacco. These products include cigarettes, chewing tobacco, and vaping devices, such as e-cigarettes. If you need help quitting, ask your health care provider. Eat a healthy diet and maintain a healthy weight. Poor diet and excess weight may make pain worse. Eat foods that are high in fiber. These include fresh fruits and vegetables, whole grains, and beans. Limit foods that are high in fat and processed sugars, such as fried and sweet foods. Exercise regularly. Exercise lowers stress and may help relieve pain. Ask your health care provider what activities and exercises are safe for you. If your health care provider approves, join an exercise class that combines movement and stress reduction. Examples include yoga and tai chi. Get enough sleep. Lack of sleep may make pain worse. Lower stress as much as possible. Practice stress reduction techniques as told by your therapist. General instructions Work with all your pain management providers to find the treatments that work best for you. You are an  important member of your pain management team. There are many things you can do to reduce pain on your own. Consider joining an online or in-person support group for people who have chronic pain. Keep all follow-up visits. This is important. Where to find more information You can find more information about managing pain without opioids from: American Academy of Pain Medicine: painmed.org Institute for Chronic Pain: instituteforchronicpain.org American Chronic Pain Association: theacpa.org Contact a health care provider if: You have side effects from pain medicine. Your pain gets worse or does not get better with treatments or home therapy. You are struggling with anxiety or depression. Summary Many types of pain can be managed without opioids. Chronic pain may respond better to pain management without opioids. Pain is best managed when you and a team of health care providers work together. Pain management without opioids may include non-opioid medicines, medical treatments, physical therapy, mental health therapy, and lifestyle changes. Tell your health care providers if your pain gets worse or is not being managed well enough. This information is not intended to replace advice given to you by your health care provider. Make sure you discuss any questions you have with your health care provider. Document Revised: 04/03/2020 Document Reviewed: 04/03/2020 Elsevier Patient Education  2024 [employer].   This is a list of the screening recommended for you and due dates:  Health Maintenance  Topic Date Due   HIV Screening  Never done   Hepatitis C Screening  Never done   Mammogram  12/26/2020   DTaP/Tdap/Td vaccine (2 - Td or Tdap) 07/19/2021  Medicare Annual Wellness Visit  05/15/2022   Flu Shot  08/06/2022   COVID-19 Vaccine (4 - 2023-24 season) 08/14/2022*   Zoster (Shingles) Vaccine (1 of 2) 10/29/2022*   Eye exam for diabetics  08/27/2022   Yearly kidney health urinalysis for  diabetes  09/18/2022   Complete foot exam   09/25/2022   Hemoglobin A1C  09/25/2022   Yearly kidney function blood test for diabetes  06/26/2023   Colon Cancer Screening  09/08/2028   HPV Vaccine  Aged Out   Pap Smear  Discontinued  *Topic was postponed. The date shown is not the original due date.    Advanced directives: (Declined) Advance directive discussed with you today. Even though you declined this today, please call our office should you change your mind, and we can give you the proper paperwork for you to fill out.  Next Medicare Annual Wellness Visit scheduled for next year: Yes  Preventive Care 40-64 Years, Female Preventive care refers to lifestyle choices and visits with your health care provider that can promote health and wellness. What does preventive care include? A yearly physical exam. This is also called an annual well check. Dental exams once or twice a year. Routine eye exams. Ask your health care provider how often you should have your eyes checked. Personal lifestyle choices, including: Daily care of your teeth and gums. Regular physical activity. Eating a healthy diet. Avoiding tobacco and drug use. Limiting alcohol use. Practicing safe sex. Taking low-dose aspirin daily starting at age 23. Taking vitamin and mineral supplements as recommended by your health care provider. What happens during an annual well check? The services and screenings done by your health care provider during your annual well check will depend on your age, overall health, lifestyle risk factors, and family history of disease. Counseling  Your health care provider may ask you questions about your: Alcohol use. Tobacco use. Drug use. Emotional well-being. Home and relationship well-being. Sexual activity. Eating habits. Work and work Astron[employer]. Method of birth control. Menstrual cycle. Pregnancy history. Screening  You may have the following tests or measurements: Height,  weight, and BMI. Blood pressure. Lipid and cholesterol levels. These may be checked every 5 years, or more frequently if you are over 5 years old. Skin check. Lung cancer screening. You may have this screening every year starting at age 74 if you have a 30-pack-year history of smoking and currently smoke or have quit within the past 15 years. Fecal occult blood test (FOBT) of the stool. You may have this test every year starting at age 70. Flexible sigmoidoscopy or colonoscopy. You may have a sigmoidoscopy every 5 years or a colonoscopy every 10 years starting at age 35. Hepatitis C blood test. Hepatitis B blood test. Sexually transmitted disease (STD) testing. Diabetes screening. This is done by checking your blood sugar (glucose) after you have not eaten for a while (fasting). You may have this done every 1-3 years. Mammogram. This may be done every 1-2 years. Talk to your health care provider about when you should start having regular mammograms. This may depend on whether you have a family history of breast cancer. BRCA-related cancer screening. This may be done if you have a family history of breast, ovarian, tubal, or peritoneal cancers. Pelvic exam and Pap test. This may be done every 3 years starting at age 26. Starting at age 65, this may be done every 5 years if you have a Pap test in combination with an HPV test. Bone density  scan. This is done to screen for osteoporosis. You may have this scan if you are at high risk for osteoporosis. Discuss your test results, treatment options, and if necessary, the need for more tests with your health care provider. Vaccines  Your health care provider may recommend certain vaccines, such as: Influenza vaccine. This is recommended every year. Tetanus, diphtheria, and acellular pertussis (Tdap, Td) vaccine. You may need a Td booster every 10 years. Zoster vaccine. You may need this after age 65. Pneumococcal 13-valent conjugate (PCV13) vaccine. You  may need this if you have certain conditions and were not previously vaccinated. Pneumococcal polysaccharide (PPSV23) vaccine. You may need one or two doses if you smoke cigarettes or if you have certain conditions. Talk to your health care provider about which screenings and vaccines you need and how often you need them. This information is not intended to replace advice given to you by your health care provider. Make sure you discuss any questions you have with your health care provider. Document Released: 01/18/2015 Document Revised: 09/11/2015 Document Reviewed: 10/23/2014 Elsevier Interactive Patient Education  2017 [employer].    Fall Prevention in the Home Falls can cause injuries. They can happen to people of all ages. There are many things you can do to make your home safe and to help prevent falls. What can I do on the outside of my home? Regularly fix the edges of walkways and driveways and fix any cracks. Remove anything that might make you trip as you walk through a door, such as a raised step or threshold. Trim any bushes or trees on the path to your home. Use bright outdoor lighting. Clear any walking paths of anything that might make someone trip, such as rocks or tools. Regularly check to see if handrails are loose or broken. Make sure that both sides of any steps have handrails. Any raised decks and porches should have guardrails on the edges. Have any leaves, snow, or ice cleared regularly. Use sand or salt on walking paths during winter. Clean up any spills in your garage right away. This includes oil or grease spills. What can I do in the bathroom? Use night lights. Install grab bars by the toilet and in the tub and shower. Do not use towel bars as grab bars. Use non-skid mats or decals in the tub or shower. If you need to sit down in the shower, use a plastic, non-slip stool. Keep the floor dry. Clean up any water that spills on the floor as soon as it  happens. Remove soap buildup in the tub or shower regularly. Attach bath mats securely with double-sided non-slip rug tape. Do not have throw rugs and other things on the floor that can make you trip. What can I do in the bedroom? Use night lights. Make sure that you have a light by your bed that is easy to reach. Do not use any sheets or blankets that are too big for your bed. They should not hang down onto the floor. Have a firm chair that has side arms. You can use this for support while you get dressed. Do not have throw rugs and other things on the floor that can make you trip. What can I do in the kitchen? Clean up any spills right away. Avoid walking on wet floors. Keep items that you use a lot in easy-to-reach places. If you need to reach something above you, use a strong step stool that has a grab bar. Keep electrical  cords out of the way. Do not use floor polish or wax that makes floors slippery. If you must use wax, use non-skid floor wax. Do not have throw rugs and other things on the floor that can make you trip. What can I do with my stairs? Do not leave any items on the stairs. Make sure that there are handrails on both sides of the stairs and use them. Fix handrails that are broken or loose. Make sure that handrails are as long as the stairways. Check any carpeting to make sure that it is firmly attached to the stairs. Fix any carpet that is loose or worn. Avoid having throw rugs at the top or bottom of the stairs. If you do have throw rugs, attach them to the floor with carpet tape. Make sure that you have a light switch at the top of the stairs and the bottom of the stairs. If you do not have them, ask someone to add them for you. What else can I do to help prevent falls? Wear shoes that: Do not have high heels. Have rubber bottoms. Are comfortable and fit you well. Are closed at the toe. Do not wear sandals. If you use a stepladder: Make sure that it is fully opened.  Do not climb a closed stepladder. Make sure that both sides of the stepladder are locked into place. Ask someone to hold it for you, if possible. Clearly mark and make sure that you can see: Any grab bars or handrails. First and last steps. Where the edge of each step is. Use tools that help you move around (mobility aids) if they are needed. These include: Canes. Walkers. Scooters. Crutches. Turn on the lights when you go into a dark area. Replace any light bulbs as soon as they burn out. Set up your furniture so you have a clear path. Avoid moving your furniture around. If any of your floors are uneven, fix them. If there are any pets around you, be aware of where they are. Review your medicines with your doctor. Some medicines can make you feel dizzy. This can increase your chance of falling. Ask your doctor what other things that you can do to help prevent falls. This information is not intended to replace advice given to you by your health care provider. Make sure you discuss any questions you have with your health care provider. Document Released: 10/18/2008 Document Revised: 05/30/2015 Document Reviewed: 01/26/2014 Elsevier Interactive Patient Education  2017 [employer].

## 2022-08-06 NOTE — Progress Notes (Signed)
Subjective:   [name] is a 57 y.o. female who presents for Medicare Annual (Subsequent) preventive examination.  Visit Complete: Virtual  I connected with  [name] on 08/06/22 by a audio enabled telemedicine application and verified that I am speaking with the correct person using two identifiers.  Patient Location: Home  Provider Location: Home Office  I discussed the limitations of evaluation and management by telemedicine. The patient expressed understanding and agreed to proceed.  Patient Medicare AWV questionnaire was completed by the patient on 08/05/22; I have confirmed that all information answered by patient is correct and no changes since this date.  Vital Signs: Unable to obtain new vitals due to this being a telehealth visit.   Review of Systems      Cardiac Risk Factors include: advanced age (>92men, >71 women);sedentary lifestyle;diabetes mellitus;hypertension;dyslipidemia     Objective:    Today's Vitals   08/05/22 2232  Weight: 195 lb (88.5 kg)  Height: 5' 7.5" (1.715 m)  PainSc: 3    Body mass index is 30.09 kg/m.     08/06/2022    1:35 PM 06/26/2022    3:03 PM 07/10/2021    6:20 AM 07/05/2021    1:04 PM 07/04/2021    8:51 PM 07/02/2021   12:20 PM 05/14/2021    1:37 PM  Advanced Directives  Does Patient Have a Medical Advance Directive? No No No No No No No  Would patient like information on creating a medical advance directive? No - Patient declined  No - Patient declined No - Patient declined  No - Patient declined No - Patient declined    Current Medications (verified) Outpatient Encounter Medications as of 08/06/2022  Medication Sig   acetaminophen (TYLENOL) 500 MG tablet Take 1 tablet (500 mg total) by mouth every 12 (twelve) hours.   ADVIL 200 MG CAPS Take 400 mg by mouth every 6 (six) hours as needed (for mild pain).   albuterol (PROVENTIL) (2.5 MG/3ML) 0.083% nebulizer solution Take 3 mLs (2.5 mg total) by nebulization every 4 (four)  hours as needed for wheezing or shortness of breath.   albuterol (VENTOLIN HFA) 108 (90 Base) MCG/ACT inhaler INHALE 2 PUFFS BY MOUTH EVERY 6 HOURS AS NEEDED FOR WHEEZING OR SHORTNESS OF BREATH   amphetamine-dextroamphetamine (ADDERALL) 20 MG tablet Take 1 tablet (20 mg total) by mouth 2 (two) times daily.   BD VEO INSULIN SYRINGE U/F 31G X 15/64" 1 ML MISC    buPROPion (WELLBUTRIN XL) 300 MG 24 hr tablet Take 1 tablet (300 mg total) by mouth daily.   CALCIUM-VITAMIN D PO Take 1 capsule by mouth in the morning and at bedtime.   carvedilol (COREG) 12.5 MG tablet Take 1 tablet (12.5 mg total) by mouth 2 (two) times daily with a meal.   cetirizine (ZYRTEC) 10 MG tablet Take 10 mg by mouth daily as needed for allergies or rhinitis.   chlorthalidone (HYGROTON) 25 MG tablet Take 1 tablet by mouth once daily   esomeprazole (NEXIUM) 20 MG capsule Take 1 capsule (20 mg total) by mouth at bedtime.   folic acid (FOLVITE) 1 MG tablet Take 3 mg by mouth See admin instructions. Take 3 mg by mouth once a day on Wed/Thurs/Fri/Sat   glucose blood (ONETOUCH VERIO) test strip USE  STRIP TO CHECK GLUCOSE TWICE DAILY AND AS NEEDED FOR DIABETES   HUMIRA PEN 40 MG/0.8ML PNKT Inject 40 mg into the skin every Monday.   ketorolac (ACULAR) 0.5 % ophthalmic solution  Place 1 drop into the left eye 2 (two) times daily.   Lancets (ONETOUCH DELICA PLUS LANCET33G) MISC SMARTSIG:1 Lancet(s) Topical Twice Daily PRN   leucovorin (WELLCOVORIN) 5 MG tablet Take 5 mg by mouth See admin instructions. Take 5 mg by mouth once a day on only Sundays and Tuesdays   MAGNESIUM GLYCINATE PO Take 1 capsule by mouth at bedtime.   metFORMIN (GLUCOPHAGE) 1000 MG tablet Take 1 tablet (1,000 mg total) by mouth 2 (two) times daily with a meal.   methocarbamol (ROBAXIN) 500 MG tablet Take 1 tablet (500 mg total) by mouth every 8 (eight) hours as needed for muscle spasms (back pain). Caution of sedation   methotrexate 50 MG/2ML injection SMARTSIG:0.9  Milliliter(s) SUB-Q Once a Week   prednisoLONE acetate (PRED FORTE) 1 % ophthalmic suspension Place 4 drops into the left eye 2 (two) times daily.   rosuvastatin (CRESTOR) 5 MG tablet Take 1 tablet (5 mg total) by mouth daily.   traMADol (ULTRAM) 50 MG tablet Take 1 tablet (50 mg total) by mouth every 6 (six) hours as needed for severe pain (for fibromyalgia flares and back pain).   budesonide-formoterol (SYMBICORT) 160-4.5 MCG/ACT inhaler Inhale 2 puffs into the lungs 2 (two) times daily as needed. (Patient not taking: Reported on 08/06/2022)   diphenhydrAMINE (BENADRYL) 25 mg capsule Take 25-50 mg by mouth every 6 (six) hours as needed for allergies. (Patient not taking: Reported on 08/06/2022)   fluconazole (DIFLUCAN) 150 MG tablet Take 1 tablet (150 mg total) by mouth once as needed for up to 1 dose (yeast infection). (Patient not taking: Reported on 08/06/2022)   Methotrexate 25 MG/ML SOSY Inject 17.5 mg into the skin every Monday. (Patient not taking: Reported on 08/06/2022)   predniSONE (DELTASONE) 20 MG tablet Take 2 pills by mouth with food daily for 3 days then 1 pill daily for 3 days (Patient not taking: Reported on 08/06/2022)   No facility-administered encounter medications on file as of 08/06/2022.    Allergies (verified) Amlodipine, Cetirizine hcl, Erythromycin, Escitalopram oxalate, Lisinopril, Loratadine, Nifedipine, Sertraline hcl, and Vyvanse [lisdexamfetamine dimesylate]   History: Past Medical History:  Diagnosis Date   ADHD (attention deficit hyperactivity disorder)    ADD - no meds   Allergy    allergic rhinitis   Anxiety    Asthma    Chronic headaches    Degenerative disc disease    Depression    Diabetes mellitus without complication (HCC)    type 2   Eczema    no current problems as of 07/09/21   Fatty liver    Fibromyalgia    Fx of fibula 2009   GERD (gastroesophageal reflux disease)    Hemorrhoids    Hypertension    Hyperthyroidism    Hypothyroidism    IBS  (irritable bowel syndrome)    Iritis, chronic    Macular hole of left eye 06/2017   PONV (postoperative nausea and vomiting)    Right leg DVT (HCC)    Sleep apnea    uses CPAP nightly   Vitamin D insufficiency 06/16/2019   Past Surgical History:  Procedure Laterality Date   ABDOMINAL HYSTERECTOMY     ANTERIOR CRUCIATE LIGAMENT REPAIR Left 1996   small tear of ACL   BREAST EXCISIONAL BIOPSY Left 1995   BREAST SURGERY     left nipple removed - benign tumor   BUNIONECTOMY Bilateral    COLONOSCOPY     EYE SURGERY     cataracts removed, macular hole  repaired left eye- both surgeries done at [facility]   LAPAROSCOPY  08/2004   endometriosis - several    ORIF TIBIA FRACTURE Right 07/10/2021   Procedure: OPEN REDUCTION INTERNAL FIXATION (ORIF) TIBIAL PLATEAU FRACTURE;  Surgeon: Myrene Galas, MD;  Location: [facility] OR;  Service: Orthopedics;  Laterality: Right;   WISDOM TOOTH EXTRACTION     Family History  Problem Relation Age of Onset   Hypertension Mother    Thyroid disease Mother    Fibromyalgia Mother    Colon polyps Mother    Diabetes Father    Kidney cancer Maternal Grandmother    Colon cancer Neg Hx    Esophageal cancer Neg Hx    Stomach cancer Neg Hx    Rectal cancer Neg Hx    Allergic rhinitis Neg Hx    Angioedema Neg Hx    Asthma Neg Hx    Atopy Neg Hx    Eczema Neg Hx    Immunodeficiency Neg Hx    Urticaria Neg Hx    Social History   Socioeconomic History   Marital status: Divorced    Spouse name: Not on file   Number of children: 1   Years of education: Not on file   Highest education level: Associate degree: occupational, Scientist, product/process development, or vocational program  Occupational History   Occupation: Disabled due to fibromyalgia    Employer: UNEMPLOYED  Tobacco Use   Smoking status: Never   Smokeless tobacco: Never  Vaping Use   Vaping status: Never Used  Substance and Sexual Activity   Alcohol use: Never    Alcohol/week: 0.0 standard drinks of alcohol   Drug use: No    Sexual activity: Not Currently    Birth control/protection: None  Other Topics Concern   Not on file  Social History Narrative   1 caffeine drink daily    Social Determinants of Health   Financial Resource Strain: Low Risk  (08/06/2022)   Overall Financial Resource Strain (CARDIA)    Difficulty of Paying Living Expenses: Not hard at all  Food Insecurity: No Food Insecurity (08/06/2022)   Hunger Vital Sign    Worried About Running Out of Food in the Last Year: Never true    Ran Out of Food in the Last Year: Never true  Transportation Needs: No Transportation Needs (08/06/2022)   PRAPARE - Administrator, Civil Service (Medical): No    Lack of Transportation (Non-Medical): No  Physical Activity: Inactive (08/06/2022)   Exercise Vital Sign    Days of Exercise per Week: 0 days    Minutes of Exercise per Session: 0 min  Stress: No Stress Concern Present (08/06/2022)   [employer] of Occupational Health - Occupational Stress Questionnaire    Feeling of Stress : Not at all  Social Connections: Moderately Integrated (08/06/2022)   Social Connection and Isolation Panel [NHANES]    Frequency of Communication with Friends and Family: More than three times a week    Frequency of Social Gatherings with Friends and Family: More than three times a week    Attends Religious Services: More than 4 times per year    Active Member of [employer] or Organizations: Yes    Attends Engineer, structural: More than 4 times per year    Marital Status: Divorced    Tobacco Counseling Counseling given: Not Answered   Clinical Intake:  Pre-visit preparation completed: Yes  Pain : 0-10 Pain Score: 3  Pain Type: Chronic pain Pain Location: Generalized Pain Descriptors /  Indicators: Archie Patten Pain Onset: More than a month ago Pain Frequency: Intermittent     BMI - recorded: 30.09 Nutritional Status: BMI > 30  Obese Nutritional Risks: None Diabetes: Yes CBG done?: No Did pt. bring  in CBG monitor from home?: No  How often do you need to have someone help you when you read instructions, pamphlets, or other written materials from your doctor or pharmacy?: 1 - Never  Interpreter Needed?: No  Information entered by :: C.Vernie Vinciguerra LPN   Activities of Daily Living    08/05/2022   10:32 PM  In your present state of health, do you have any difficulty performing the following activities:  Hearing? 1  Comment left ear hearing loss  Vision? 0  Difficulty concentrating or making decisions? 0  Walking or climbing stairs? 0  Dressing or bathing? 0  Doing errands, shopping? 0  Preparing Food and eating ? N  Using the Toilet? N  In the past six months, have you accidently leaked urine? N  Do you have problems with loss of bowel control? N  Managing your Medications? N  Managing your Finances? N  Housekeeping or managing your Housekeeping? N    Patient Care Team: Tower, Audrie Gallus, MD as PCP - General (Family Medicine) Waymon Budge, MD as Consulting Physician (Pulmonary Disease) Sharee Pimple, MD as Referring Physician (Ophthalmology) Randall Hiss, MD as Referring Physician (Internal Medicine) Elise Benne, MD as Consulting Physician (Ophthalmology) Dellis Anes Hetty Ely, MD as Consulting Physician (Allergy and Immunology)  Indicate any recent Medical Services you may have received from other than [facility] providers in the past year (date may be approximate).     Assessment:   This is a routine wellness examination for Neal.  Hearing/Vision screen Hearing Screening - Comments:: Hearing loss in left ear Vision Screening - Comments:: Readers - [facility] Dr.G. Jaffe  - UTD on exams  Dietary issues and exercise activities discussed:     Goals Addressed             This Visit's Progress    Patient Stated       Drink more water.       Depression Screen    08/06/2022    1:35 PM 03/25/2022    8:09 AM 02/13/2022    9:01 AM 05/14/2021    1:36 PM  10/07/2020    2:33 PM 02/27/2020    4:41 PM 08/22/2019    3:38 PM  PHQ 2/9 Scores  PHQ - 2 Score 0 1 0 0 0 2 0  PHQ- 9 Score  1 4  4 11      Fall Risk    08/06/2022    1:37 PM 08/05/2022   10:32 PM 03/25/2022    8:09 AM 02/13/2022    9:01 AM 05/14/2021    1:38 PM  Fall Risk   Falls in the past year? 0 0 1 1 0  Number falls in past yr: 0  0 0 0  Injury with Fall? 0  1 1 0  Risk for fall due to : No Fall Risks  History of fall(s) History of fall(s) No Fall Risks  Follow up Falls prevention discussed;Falls evaluation completed  Falls evaluation completed Falls evaluation completed Falls evaluation completed    MEDICARE RISK AT HOME:  Medicare Risk at Home - 08/05/22 2232     Home free of loose throw rugs in walkways, pet beds, electrical cords, etc? Yes  TIMED UP AND GO:  Was the test performed?  No    Cognitive Function:    04/10/2016   10:32 AM 03/20/2015   11:15 AM  MMSE - Mini Mental State Exam  Orientation to time 5 5  Orientation to Place 5 5  Registration 3 3  Attention/ Calculation 0 5  Recall 3 3  Language- name 2 objects 0 0  Language- repeat 1 1  Language- follow 3 step command 3 3  Language- read & follow direction 0 1  Write a sentence 0 0  Copy design 0 0  Total score 20 26        08/06/2022    1:38 PM 05/14/2021    1:41 PM  6CIT Screen  What Year? 0 points 0 points  What month? 0 points 0 points  What time? 0 points 0 points  Count back from 20 0 points 0 points  Months in reverse 0 points 0 points  Repeat phrase 2 points 0 points  Total Score 2 points 0 points    Immunizations Immunization History  Administered Date(s) Administered   Influenza Split 10/29/2010, 10/02/2011   Influenza Whole 10/13/2006, 10/03/2008, 10/09/2009   Influenza,inj,Quad PF,6+ Mos 09/14/2012, 10/11/2013, 09/12/2015, 11/02/2016, 09/17/2017, 09/14/2018, 10/09/2019, 09/24/2021   Influenza-Unspecified 09/18/2014   PFIZER(Purple Top)SARS-COV-2 Vaccination  09/08/2019, 09/29/2019, 03/20/2020   PPD Test 03/24/2012   Pneumococcal Polysaccharide-23 11/02/2012   Tdap 07/20/2011    TDAP status: Due, Education has been provided regarding the importance of this vaccine. Advised may receive this vaccine at local pharmacy or Health Dept. Aware to provide a copy of the vaccination record if obtained from local pharmacy or Health Dept. Verbalized acceptance and understanding.  Flu Vaccine status: Due, Education has been provided regarding the importance of this vaccine. Advised may receive this vaccine at local pharmacy or Health Dept. Aware to provide a copy of the vaccination record if obtained from local pharmacy or Health Dept. Verbalized acceptance and understanding.  Pneumococcal vaccine status: Due, Education has been provided regarding the importance of this vaccine. Advised may receive this vaccine at local pharmacy or Health Dept. Aware to provide a copy of the vaccination record if obtained from local pharmacy or Health Dept. Verbalized acceptance and understanding.  Covid-19 vaccine status: Information provided on how to obtain vaccines.   Qualifies for Shingles Vaccine? Yes   Zostavax completed No   Shingrix Completed?: No.    Education has been provided regarding the importance of this vaccine. Patient has been advised to call insurance company to determine out of pocket expense if they have not yet received this vaccine. Advised may also receive vaccine at local pharmacy or Health Dept. Verbalized acceptance and understanding.  Screening Tests Health Maintenance  Topic Date Due   HIV Screening  Never done   Hepatitis C Screening  Never done   MAMMOGRAM  12/26/2020   DTaP/Tdap/Td (2 - Td or Tdap) 07/19/2021   INFLUENZA VACCINE  08/06/2022   COVID-19 Vaccine (4 - 2023-24 season) 08/14/2022 (Originally 09/05/2021)   Zoster Vaccines- Shingrix (1 of 2) 10/29/2022 (Originally 06/19/1986)   OPHTHALMOLOGY EXAM  08/27/2022   Diabetic kidney  evaluation - Urine ACR  09/18/2022   FOOT EXAM  09/25/2022   HEMOGLOBIN A1C  09/25/2022   Diabetic kidney evaluation - eGFR measurement  06/26/2023   Medicare Annual Wellness (AWV)  08/06/2023   Colonoscopy  09/08/2028   HPV VACCINES  Aged Out   PAP SMEAR-Modifier  Discontinued    Health Maintenance  Health  Maintenance Due  Topic Date Due   HIV Screening  Never done   Hepatitis C Screening  Never done   MAMMOGRAM  12/26/2020   DTaP/Tdap/Td (2 - Td or Tdap) 07/19/2021   INFLUENZA VACCINE  08/06/2022    Colorectal cancer screening: Type of screening: Colonoscopy. Completed 09/09/18. Repeat every 10 years  Mammogram status: Ordered 08/06/22. Pt provided with contact info and advised to call to schedule appt.     Lung Cancer Screening: (Low Dose CT Chest recommended if Age 80-80 years, 20 pack-year currently smoking OR have quit w/in 15years.) does not qualify.   Lung Cancer Screening Referral: no  Additional Screening:  Hepatitis C Screening: does qualify; Completed DUE  Vision Screening: Recommended annual ophthalmology exams for early detection of glaucoma and other disorders of the eye. Is the patient up to date with their annual eye exam?  Yes  Who is the provider or what is the name of the office in which the patient attends annual eye exams? [facility] If pt is not established with a provider, would they like to be referred to a provider to establish care? Yes .   Dental Screening: Recommended annual dental exams for proper oral hygiene  Diabetic Foot Exam: Diabetic Foot Exam: Completed 09/24/21  Community Resource Referral / Chronic Care Management: CRR required this visit?  No   CCM required this visit?  No     Plan:     I have personally reviewed and noted the following in the patient's chart:   Medical and social history Use of alcohol, tobacco or illicit drugs  Current medications and supplements including opioid prescriptions. Patient is currently taking  opioid prescriptions. Information provided to patient regarding non-opioid alternatives. Patient advised to discuss non-opioid treatment plan with their provider. Functional ability and status Nutritional status Physical activity Advanced directives List of other physicians Hospitalizations, surgeries, and ER visits in previous 12 months Vitals Screenings to include cognitive, depression, and falls Referrals and appointments  In addition, I have reviewed and discussed with patient certain preventive protocols, quality metrics, and best practice recommendations. A written personalized care plan for preventive services as well as general preventive health recommendations were provided to patient.     Maryan Puls, LPN   4/0/9811   After Visit Summary: (MyChart) Due to this being a telephonic visit, the after visit summary with patients personalized plan was offered to patient via MyChart   Nurse Notes: Order placed for mammogram.

## 2022-08-18 ENCOUNTER — Ambulatory Visit (INDEPENDENT_AMBULATORY_CARE_PROVIDER_SITE_OTHER): Payer: Medicare HMO | Admitting: Family Medicine

## 2022-08-18 ENCOUNTER — Encounter: Payer: Self-pay | Admitting: Family Medicine

## 2022-08-18 ENCOUNTER — Other Ambulatory Visit: Payer: Self-pay | Admitting: Family Medicine

## 2022-08-18 VITALS — BP 131/80 | HR 82 | Temp 97.7°F | Ht 67.5 in | Wt 192.2 lb

## 2022-08-18 DIAGNOSIS — Z7984 Long term (current) use of oral hypoglycemic drugs: Secondary | ICD-10-CM

## 2022-08-18 DIAGNOSIS — B3731 Acute candidiasis of vulva and vagina: Secondary | ICD-10-CM | POA: Diagnosis not present

## 2022-08-18 DIAGNOSIS — I10 Essential (primary) hypertension: Secondary | ICD-10-CM

## 2022-08-18 DIAGNOSIS — D849 Immunodeficiency, unspecified: Secondary | ICD-10-CM

## 2022-08-18 DIAGNOSIS — E1165 Type 2 diabetes mellitus with hyperglycemia: Secondary | ICD-10-CM

## 2022-08-18 LAB — POCT GLYCOSYLATED HEMOGLOBIN (HGB A1C): Hemoglobin A1C: 8.9 % — AB (ref 4.0–5.6)

## 2022-08-18 MED ORDER — OZEMPIC (0.25 OR 0.5 MG/DOSE) 2 MG/3ML ~~LOC~~ SOPN
0.2500 mg | PEN_INJECTOR | SUBCUTANEOUS | 0 refills | Status: DC
Start: 2022-08-18 — End: 2022-09-28

## 2022-08-18 MED ORDER — ONETOUCH DELICA PLUS LANCET33G MISC: 1 refills | Status: DC

## 2022-08-18 MED ORDER — FLUCONAZOLE 150 MG PO TABS: 150.0000 mg | ORAL_TABLET | Freq: Once | ORAL | 2 refills | Status: AC

## 2022-08-18 NOTE — Patient Instructions (Addendum)
I sent prescription for generic ozempic to your pharmacy  We will see what drug in this class is covered and available   Continue low glycemic eating  Try to get most of your carbohydrates from produce (with the exception of white potatoes) and whole grains Eat less bread/pasta/rice/snack foods/cereals/sweets and other items from the middle of the grocery store (processed carbs)   Fit in exercise any chance you can   Follow up in sept as planned   Take diflucan as needed for yeast infection

## 2022-08-18 NOTE — Assessment & Plan Note (Signed)
Pt has recurrent ones 3 in a row   Just had diflucan dose 10 d ago and no symptoms at all  No doubt high glucose adds to this  Also recent prednisone Also methotrexate and humira (she plans to discuss with oph)   Will let us know if symptoms return  Given prescription for diflucan with refill

## 2022-08-18 NOTE — Assessment & Plan Note (Signed)
No doubt adding to frequent yeast infections   On methotrexate and humira for eye condition  Had recent prednisone for back problem  Prior to that several uris and an abscess

## 2022-08-18 NOTE — Progress Notes (Signed)
Subjective:    Patient ID: [name], female    DOB: 08/11/1965, 57 y.o.   MRN: [id number]  HPI  Wt Readings from Last 3 Encounters:  08/18/22 192 lb 4 oz (87.2 kg)  08/05/22 195 lb (88.5 kg)  07/29/22 194 lb 8 oz (88.2 kg)   29.67 kg/m  Vitals:   08/18/22 0814 08/18/22 0844  BP: (!) 146/92 131/80  Pulse: 82   Temp: 97.7 F (36.5 C)   SpO2: 97%    Pt presents for elevated glucose/ DM with frequent yeast vaginitis  Has had 3 in a row  Did just take a diflucan  Lab Results  Component Value Date   HGBA1C 8.9 (A) 08/18/2022   Today 8.9 - up significantly   Glucose is averging in the 170s  As high as 307 Metformin 1000 mg bid   Eating really well  Chicken and salad and veggies  Seldom sweets   No pasta  No rice  Some bread but not a lot    Has to take methotrexate and humira for  iritis    Was on prednisone (low dose) at the end of July for back pain  Had uti in June Had abscess in the winter and uri in may   Exercise is limited by myofascial pain and chronic ortho issues  She works and cares for parents and household       HTN bp is stable today  No cp or palpitations or headaches or edema  No side effects to medicines  BP Readings from Last 3 Encounters:  08/18/22 131/80  07/29/22 (!) 144/88  06/26/22 (!) 156/92    Carvedilol 12.5 mg bid  Chlorthalidone 25 mg daily   Pulse Readings from Last 3 Encounters:  08/18/22 82  07/29/22 80  06/26/22 75   Lab Results  Component Value Date   NA 136 06/26/2022   K 4.0 06/26/2022   CO2 22 06/26/2022   GLUCOSE 203 (H) 06/26/2022   BUN 15 06/26/2022   CREATININE 1.33 (H) 06/26/2022   CALCIUM 9.0 06/26/2022   GFR 78.52 05/11/2022   GFRNONAA 47 (L) 06/26/2022        Patient Active Problem List   Diagnosis Date Noted   Piriformis muscle pain 07/29/2022   Post-viral cough syndrome 03/25/2022   Aortic atherosclerosis (HCC) 03/25/2022   Fractures, stress 12/26/2021   Immunosuppressed  status (HCC) 11/19/2021   Encounter for screening mammogram for breast cancer 09/24/2021   Osteoporosis 09/24/2021   Estrogen deficiency 09/01/2021   History of DVT (deep vein thrombosis) 07/11/2021   Closed fracture of right tibial plateau 07/10/2021   Sleep apnea 05/14/2021   BMI 30.0-30.9,adult 07/10/2020   Hyperlipidemia associated with type 2 diabetes mellitus (HCC) 07/10/2019   Cystoid macular edema of both eyes 06/16/2019   Vitamin D deficiency 06/16/2019   Type 2 diabetes mellitus with hyperglycemia (HCC) 06/06/2019   Yeast vaginitis 05/17/2019   Peripheral edema 10/30/2017   Moderate persistent asthma, uncomplicated 08/10/2017   Chronic rhinitis 08/10/2017   Cough 06/06/2017   Welcome to Medicare preventive visit 04/20/2017   Chronic low back pain 12/09/2015   Degeneration of lumbar or lumbosacral intervertebral disc 12/09/2015   Hypothyroid 01/24/2014   Pseudophakia of both eyes 12/15/2013   Nasal septal deviation 01/03/2013   Encounter for Medicare annual wellness exam 11/02/2012   Hydradenitis 11/02/2012   Cystoid macular degeneration of retina 05/13/2012   Macular hole 03/23/2012   Screening-pulmonary TB 03/23/2012   Routine general medical  examination at a health care facility 10/02/2011   Iritis, recurrent 10/02/2011   Tachycardia 10/02/2011   ADD (attention deficit disorder) 08/20/2010   DYSHIDROTIC ECZEMA, HANDS 11/12/2009   Obstructive sleep apnea 11/19/2006   Depression with anxiety 09/29/2006   Essential hypertension 09/29/2006   Seasonal and perennial allergic rhinitis 09/29/2006   Asthma, mild intermittent, well-controlled 09/29/2006   GERD 09/29/2006   IBS 09/29/2006   ENDOMETRIOSIS 09/29/2006   FEMALE INFERTILITY 09/29/2006   Fibromyalgia 09/29/2006   INSOMNIA 09/29/2006   Past Medical History:  Diagnosis Date   ADHD (attention deficit hyperactivity disorder)    ADD - no meds   Allergy    allergic rhinitis   Anxiety    Asthma    Chronic  headaches    Degenerative disc disease    Depression    Diabetes mellitus without complication (HCC)    type 2   Eczema    no current problems as of 07/09/21   Fatty liver    Fibromyalgia    Fx of fibula 2009   GERD (gastroesophageal reflux disease)    Hemorrhoids    Hypertension    Hyperthyroidism    Hypothyroidism    IBS (irritable bowel syndrome)    Iritis, chronic    Macular hole of left eye 06/2017   PONV (postoperative nausea and vomiting)    Right leg DVT (HCC)    Sleep apnea    uses CPAP nightly   Vitamin D insufficiency 06/16/2019   Past Surgical History:  Procedure Laterality Date   ABDOMINAL HYSTERECTOMY     ANTERIOR CRUCIATE LIGAMENT REPAIR Left 1996   small tear of ACL   BREAST EXCISIONAL BIOPSY Left 1995   BREAST SURGERY     left nipple removed - benign tumor   BUNIONECTOMY Bilateral    COLONOSCOPY     EYE SURGERY     cataracts removed, macular hole repaired left eye- both surgeries done at [facility]   LAPAROSCOPY  08/2004   endometriosis - several    ORIF TIBIA FRACTURE Right 07/10/2021   Procedure: OPEN REDUCTION INTERNAL FIXATION (ORIF) TIBIAL PLATEAU FRACTURE;  Surgeon: Myrene Galas, MD;  Location: [facility] OR;  Service: Orthopedics;  Laterality: Right;   WISDOM TOOTH EXTRACTION     Social History   Tobacco Use   Smoking status: Never   Smokeless tobacco: Never  Vaping Use   Vaping status: Never Used  Substance Use Topics   Alcohol use: Never    Alcohol/week: 0.0 standard drinks of alcohol   Drug use: No   Family History  Problem Relation Age of Onset   Hypertension Mother    Thyroid disease Mother    Fibromyalgia Mother    Colon polyps Mother    Diabetes Father    Kidney cancer Maternal Grandmother    Colon cancer Neg Hx    Esophageal cancer Neg Hx    Stomach cancer Neg Hx    Rectal cancer Neg Hx    Allergic rhinitis Neg Hx    Angioedema Neg Hx    Asthma Neg Hx    Atopy Neg Hx    Eczema Neg Hx    Immunodeficiency Neg Hx    Urticaria Neg Hx     Allergies  Allergen Reactions   Amlodipine     Pedal edema    Cetirizine Hcl Other (See Comments)    Not effective   Erythromycin Nausea Only and Other (See Comments)    Caused a stomach ache   Escitalopram Oxalate Other (See  Comments)    "No improvement"   Lisinopril Cough   Loratadine Other (See Comments)    "Not effective"   Nifedipine Other (See Comments)    "Fatigue and tremors"   Sertraline Hcl Other (See Comments)    "Did not help"   Vyvanse [Lisdexamfetamine Dimesylate]     Headache and excitablility   Current Outpatient Medications on File Prior to Visit  Medication Sig Dispense Refill   acetaminophen (TYLENOL) 500 MG tablet Take 1 tablet (500 mg total) by mouth every 12 (twelve) hours. 60 tablet 0   ADVIL 200 MG CAPS Take 400 mg by mouth every 6 (six) hours as needed (for mild pain).     albuterol (PROVENTIL) (2.5 MG/3ML) 0.083% nebulizer solution Take 3 mLs (2.5 mg total) by nebulization every 4 (four) hours as needed for wheezing or shortness of breath. 75 mL 1   albuterol (VENTOLIN HFA) 108 (90 Base) MCG/ACT inhaler INHALE 2 PUFFS BY MOUTH EVERY 6 HOURS AS NEEDED FOR WHEEZING OR SHORTNESS OF BREATH 18 g 0   amphetamine-dextroamphetamine (ADDERALL) 20 MG tablet Take 1 tablet (20 mg total) by mouth 2 (two) times daily. 60 tablet 0   BD VEO INSULIN SYRINGE U/F 31G X 15/64" 1 ML MISC      budesonide-formoterol (SYMBICORT) 160-4.5 MCG/ACT inhaler Inhale 2 puffs into the lungs 2 (two) times daily as needed. 1 each 5   buPROPion (WELLBUTRIN XL) 300 MG 24 hr tablet Take 1 tablet (300 mg total) by mouth daily. 90 tablet 3   CALCIUM-VITAMIN D PO Take 1 capsule by mouth in the morning and at bedtime.     carvedilol (COREG) 6.25 MG tablet Take 6.25 mg by mouth 2 (two) times daily.     cetirizine (ZYRTEC) 10 MG tablet Take 10 mg by mouth daily as needed for allergies or rhinitis.     chlorthalidone (HYGROTON) 25 MG tablet Take 1 tablet by mouth once daily 90 tablet 0    diphenhydrAMINE (BENADRYL) 25 mg capsule Take 25-50 mg by mouth every 6 (six) hours as needed for allergies.     esomeprazole (NEXIUM) 20 MG capsule Take 1 capsule (20 mg total) by mouth at bedtime. 90 capsule 3   folic acid (FOLVITE) 1 MG tablet Take 3 mg by mouth See admin instructions. Take 3 mg by mouth once a day on Wed/Thurs/Fri/Sat     glucose blood (ONETOUCH VERIO) test strip USE  STRIP TO CHECK GLUCOSE TWICE DAILY AND AS NEEDED FOR DIABETES 100 each 1   HUMIRA PEN 40 MG/0.8ML PNKT Inject 40 mg into the skin every Monday.     ketorolac (ACULAR) 0.5 % ophthalmic solution Place 1 drop into the left eye 2 (two) times daily.     Lancets (ONETOUCH DELICA PLUS LANCET33G) MISC SMARTSIG:1 Lancet(s) Topical Twice Daily PRN     leucovorin (WELLCOVORIN) 5 MG tablet Take 5 mg by mouth See admin instructions. Take 5 mg by mouth once a day on only Sundays and Tuesdays     MAGNESIUM GLYCINATE PO Take 1 capsule by mouth at bedtime.     metFORMIN (GLUCOPHAGE) 1000 MG tablet Take 1 tablet (1,000 mg total) by mouth 2 (two) times daily with a meal. 180 tablet 3   methocarbamol (ROBAXIN) 500 MG tablet Take 1 tablet (500 mg total) by mouth every 8 (eight) hours as needed for muscle spasms (back pain). Caution of sedation 30 tablet 3   Methotrexate 25 MG/ML SOSY Inject 17.5 mg into the skin every Monday.  methotrexate 50 MG/2ML injection SMARTSIG:0.9 Milliliter(s) SUB-Q Once a Week     prednisoLONE acetate (PRED FORTE) 1 % ophthalmic suspension Place 4 drops into the left eye 2 (two) times daily.     rosuvastatin (CRESTOR) 5 MG tablet Take 1 tablet (5 mg total) by mouth daily. 90 tablet 3   traMADol (ULTRAM) 50 MG tablet Take 1 tablet (50 mg total) by mouth every 6 (six) hours as needed for severe pain (for fibromyalgia flares and back pain). 60 tablet 0   No current facility-administered medications on file prior to visit.    Review of Systems  Constitutional:  Positive for fatigue. Negative for activity  change, appetite change, fever and unexpected weight change.  HENT:  Negative for congestion, rhinorrhea, sore throat and trouble swallowing.   Eyes:  Negative for pain, redness, itching and visual disturbance.  Respiratory:  Negative for cough, chest tightness, shortness of breath and wheezing.   Cardiovascular:  Negative for chest pain and palpitations.  Gastrointestinal:  Negative for abdominal pain, blood in stool, constipation, diarrhea and nausea.  Endocrine: Negative for cold intolerance, heat intolerance, polydipsia and polyuria.  Genitourinary:  Negative for difficulty urinating, dysuria, frequency and urgency.       Frequent yeast vaginitis with itching / burning  None now /recently treated   Musculoskeletal:  Negative for arthralgias, joint swelling and myalgias.  Skin:  Negative for pallor and rash.  Neurological:  Negative for dizziness, tremors, weakness, numbness and headaches.  Hematological:  Negative for adenopathy. Does not bruise/bleed easily.  Psychiatric/Behavioral:  Negative for decreased concentration and dysphoric mood. The patient is not nervous/anxious.        Caregiving Stressful situation        Objective:   Physical Exam Constitutional:      General: She is not in acute distress.    Appearance: Normal appearance. She is well-developed. She is obese.  HENT:     Head: Normocephalic and atraumatic.  Eyes:     Conjunctiva/sclera: Conjunctivae normal.     Pupils: Pupils are equal, round, and reactive to light.  Neck:     Thyroid: No thyromegaly.     Vascular: No carotid bruit or JVD.  Cardiovascular:     Rate and Rhythm: Normal rate and regular rhythm.     Heart sounds: Normal heart sounds.     No gallop.  Pulmonary:     Effort: Pulmonary effort is normal. No respiratory distress.     Breath sounds: Normal breath sounds. No wheezing or rales.  Abdominal:     General: There is no distension or abdominal bruit.     Palpations: Abdomen is soft.   Musculoskeletal:     Cervical back: Normal range of motion and neck supple.     Right lower leg: No edema.     Left lower leg: No edema.  Lymphadenopathy:     Cervical: No cervical adenopathy.  Skin:    General: Skin is warm and dry.     Coloration: Skin is not pale.     Findings: No rash.  Neurological:     Mental Status: She is alert.     Coordination: Coordination normal.     Deep Tendon Reflexes: Reflexes are normal and symmetric. Reflexes normal.  Psychiatric:        Mood and Affect: Mood normal.           Assessment & Plan:   Problem List Items Addressed This Visit       Cardiovascular and  Mediastinum   Essential hypertension    Bp is improving BP: 131/80  Higher at home-plans to try a diff cuff  Pulse is not too low   Plan to continue Carvedilol 12.5 mg bid   (have room to inc this further in the future if needed since pulse is not low)  Chlorthalidone 25 mg daily   Labs and follow up /annual upcoming in sept  Disc lifestyle change  Enc less processed foods for this and wt loss       Relevant Medications   carvedilol (COREG) 6.25 MG tablet     Endocrine   Type 2 diabetes mellitus with hyperglycemia (HCC) - Primary    Worse control with multiple infections and course of prednisone for back pain  Lab Results  Component Value Date   HGBA1C 8.9 (A) 08/18/2022   Eating well for the most part  Continues metformin 1000 mg bid (watching renal fxn) Is a good candidate for GLP-1  Disc option of GLP medication including possible side effects like GI intolerance and risk of thyroid and endocrine cancer, pancreatitis and gallstones, kidney problems and diabetic retinopathy Sent ozempic prescription 0.25 mg to pharmacy - will discuss monthly once starting how glucose is and tolerability  No doubt if this is tolerated will help with weight loss  Handout given  Little time for self care- taking care of mother and household and working / also myofascial pain    No doubt high glucose is adding to frequent yeast infections       Relevant Medications   Semaglutide,0.25 or 0.5MG /DOS, (OZEMPIC, 0.25 OR 0.5 MG/DOSE,) 2 MG/3ML SOPN   Other Relevant Orders   POCT glycosylated hemoglobin (Hb A1C) (Completed)     Genitourinary   Yeast vaginitis    Pt has recurrent ones 3 in a row   Just had diflucan dose 10 d ago and no symptoms at all  No doubt high glucose adds to this  Also recent prednisone Also methotrexate and humira (she plans to discuss with oph)   Will let us know if symptoms return  Given prescription for diflucan with refill        Relevant Medications   fluconazole (DIFLUCAN) 150 MG tablet     Other   Immunosuppressed status (HCC)    No doubt adding to frequent yeast infections   On methotrexate and humira for eye condition  Had recent prednisone for back problem  Prior to that several uris and an abscess

## 2022-08-18 NOTE — Assessment & Plan Note (Addendum)
Bp is improving BP: 131/80  Higher at home-plans to try a diff cuff  Pulse is not too low   Plan to continue Carvedilol 12.5 mg bid   (have room to inc this further in the future if needed since pulse is not low)  Chlorthalidone 25 mg daily   Labs and follow up /annual upcoming in sept  Disc lifestyle change  Enc less processed foods for this and wt loss

## 2022-08-18 NOTE — Assessment & Plan Note (Addendum)
Worse control with multiple infections and course of prednisone for back pain  Lab Results  Component Value Date   HGBA1C 8.9 (A) 08/18/2022   Eating well for the most part  Continues metformin 1000 mg bid (watching renal fxn) Is a good candidate for GLP-1  Disc option of GLP medication including possible side effects like GI intolerance and risk of thyroid and endocrine cancer, pancreatitis and gallstones, kidney problems and diabetic retinopathy Sent ozempic prescription 0.25 mg to pharmacy - will discuss monthly once starting how glucose is and tolerability  No doubt if this is tolerated will help with weight loss  Handout given  Little time for self care- taking care of mother and household and working / also myofascial pain   No doubt high glucose is adding to frequent yeast infections

## 2022-08-20 ENCOUNTER — Encounter (INDEPENDENT_AMBULATORY_CARE_PROVIDER_SITE_OTHER): Payer: Self-pay

## 2022-08-21 DIAGNOSIS — H3023 Posterior cyclitis, bilateral: Secondary | ICD-10-CM | POA: Diagnosis not present

## 2022-08-21 DIAGNOSIS — H20023 Recurrent acute iridocyclitis, bilateral: Secondary | ICD-10-CM | POA: Diagnosis not present

## 2022-08-21 DIAGNOSIS — Z79899 Other long term (current) drug therapy: Secondary | ICD-10-CM | POA: Diagnosis not present

## 2022-08-21 DIAGNOSIS — H35342 Macular cyst, hole, or pseudohole, left eye: Secondary | ICD-10-CM | POA: Diagnosis not present

## 2022-08-21 DIAGNOSIS — H35353 Cystoid macular degeneration, bilateral: Secondary | ICD-10-CM | POA: Diagnosis not present

## 2022-08-21 LAB — HM DIABETES EYE EXAM

## 2022-09-20 ENCOUNTER — Telehealth: Payer: Self-pay | Admitting: Family Medicine

## 2022-09-20 DIAGNOSIS — E1169 Type 2 diabetes mellitus with other specified complication: Secondary | ICD-10-CM

## 2022-09-20 DIAGNOSIS — E1165 Type 2 diabetes mellitus with hyperglycemia: Secondary | ICD-10-CM

## 2022-09-20 DIAGNOSIS — M8000XD Age-related osteoporosis with current pathological fracture, unspecified site, subsequent encounter for fracture with routine healing: Secondary | ICD-10-CM

## 2022-09-20 DIAGNOSIS — I10 Essential (primary) hypertension: Secondary | ICD-10-CM

## 2022-09-20 DIAGNOSIS — Z79899 Other long term (current) drug therapy: Secondary | ICD-10-CM | POA: Insufficient documentation

## 2022-09-20 DIAGNOSIS — E559 Vitamin D deficiency, unspecified: Secondary | ICD-10-CM

## 2022-09-20 DIAGNOSIS — E039 Hypothyroidism, unspecified: Secondary | ICD-10-CM

## 2022-09-20 NOTE — Telephone Encounter (Signed)
-----   Message from Lovena Neighbours sent at 09/01/2022  3:24 PM EDT ----- Regarding: Fasting labs for 9.16.24 Please put fasting physical lab orders in future. Thank you, Denny Peon

## 2022-09-21 ENCOUNTER — Other Ambulatory Visit (INDEPENDENT_AMBULATORY_CARE_PROVIDER_SITE_OTHER): Payer: Medicare HMO

## 2022-09-21 ENCOUNTER — Telehealth: Payer: Self-pay

## 2022-09-21 DIAGNOSIS — E1169 Type 2 diabetes mellitus with other specified complication: Secondary | ICD-10-CM

## 2022-09-21 DIAGNOSIS — E559 Vitamin D deficiency, unspecified: Secondary | ICD-10-CM

## 2022-09-21 DIAGNOSIS — E785 Hyperlipidemia, unspecified: Secondary | ICD-10-CM

## 2022-09-21 DIAGNOSIS — I10 Essential (primary) hypertension: Secondary | ICD-10-CM | POA: Diagnosis not present

## 2022-09-21 DIAGNOSIS — Z79899 Other long term (current) drug therapy: Secondary | ICD-10-CM

## 2022-09-21 DIAGNOSIS — E1165 Type 2 diabetes mellitus with hyperglycemia: Secondary | ICD-10-CM

## 2022-09-21 DIAGNOSIS — E039 Hypothyroidism, unspecified: Secondary | ICD-10-CM

## 2022-09-21 LAB — TSH: TSH: 6.82 u[IU]/mL — ABNORMAL HIGH (ref 0.35–5.50)

## 2022-09-21 LAB — COMPREHENSIVE METABOLIC PANEL
ALT: 17 U/L (ref 0–35)
AST: 45 U/L — ABNORMAL HIGH (ref 0–37)
Albumin: 4.1 g/dL (ref 3.5–5.2)
Alkaline Phosphatase: 68 U/L (ref 39–117)
BUN: 12 mg/dL (ref 6–23)
CO2: 21 meq/L (ref 19–32)
Calcium: 9.6 mg/dL (ref 8.4–10.5)
Chloride: 100 meq/L (ref 96–112)
Creatinine, Ser: 0.91 mg/dL (ref 0.40–1.20)
GFR: 71.15 mL/min (ref >60.00)
Glucose, Bld: 117 mg/dL — ABNORMAL HIGH (ref 70–99)
Potassium: 4 meq/L (ref 3.5–5.1)
Sodium: 137 meq/L (ref 135–145)
Total Bilirubin: 0.7 mg/dL (ref 0.2–1.2)
Total Protein: 7.1 g/dL (ref 6.0–8.3)

## 2022-09-21 LAB — VITAMIN D 25 HYDROXY (VIT D DEFICIENCY, FRACTURES): VITD: 47.43 ng/mL (ref 30.00–100.00)

## 2022-09-21 LAB — MICROALBUMIN / CREATININE URINE RATIO
Creatinine,U: 145.7 mg/dL
Microalb Creat Ratio: 1.5 mg/g (ref 0.0–30.0)
Microalb, Ur: 2.2 mg/dL — ABNORMAL HIGH (ref 0.0–1.9)

## 2022-09-21 LAB — LIPID PANEL
Cholesterol: 118 mg/dL (ref 0–200)
HDL: 52.7 mg/dL (ref >39.00)
LDL Cholesterol: 40 mg/dL (ref 0–99)
NonHDL: 65.25
Total CHOL/HDL Ratio: 2
Triglycerides: 126 mg/dL (ref 0.0–149.0)
VLDL: 25.2 mg/dL (ref 0.0–40.0)

## 2022-09-21 LAB — VITAMIN B12: Vitamin B-12: 214 pg/mL (ref 211–911)

## 2022-09-21 NOTE — Telephone Encounter (Signed)
Called pt needs to come back in for a redraw on lab work no charge.

## 2022-09-22 ENCOUNTER — Other Ambulatory Visit (INDEPENDENT_AMBULATORY_CARE_PROVIDER_SITE_OTHER): Payer: Medicare HMO

## 2022-09-22 DIAGNOSIS — I10 Essential (primary) hypertension: Secondary | ICD-10-CM

## 2022-09-22 LAB — CBC WITH DIFFERENTIAL/PLATELET
Basophils Absolute: 0.1 10*3/uL (ref 0.0–0.1)
Basophils Relative: 1 % (ref 0.0–3.0)
Eosinophils Absolute: 0.5 10*3/uL (ref 0.0–0.7)
Eosinophils Relative: 5.5 % — ABNORMAL HIGH (ref 0.0–5.0)
HCT: 42.1 % (ref 36.0–46.0)
Hemoglobin: 13.6 g/dL (ref 12.0–15.0)
Lymphocytes Relative: 48.1 % — ABNORMAL HIGH (ref 12.0–46.0)
Lymphs Abs: 4.8 10*3/uL — ABNORMAL HIGH (ref 0.7–4.0)
MCHC: 32.3 g/dL (ref 30.0–36.0)
MCV: 87.2 fl (ref 78.0–100.0)
Monocytes Absolute: 0.7 10*3/uL (ref 0.1–1.0)
Monocytes Relative: 6.5 % (ref 3.0–12.0)
Neutro Abs: 3.9 10*3/uL (ref 1.4–7.7)
Neutrophils Relative %: 38.9 % — ABNORMAL LOW (ref 43.0–77.0)
Platelets: 260 10*3/uL (ref 150.0–400.0)
RBC: 4.83 Mil/uL (ref 3.87–5.11)
RDW: 15 % (ref 11.5–15.5)
WBC: 10 10*3/uL (ref 4.0–10.5)

## 2022-09-28 ENCOUNTER — Encounter: Payer: Self-pay | Admitting: Family Medicine

## 2022-09-28 ENCOUNTER — Ambulatory Visit: Payer: Medicare HMO | Admitting: Family Medicine

## 2022-09-28 VITALS — BP 128/84 | HR 90 | Temp 97.9°F | Ht 65.75 in | Wt 178.4 lb

## 2022-09-28 DIAGNOSIS — D849 Immunodeficiency, unspecified: Secondary | ICD-10-CM | POA: Diagnosis not present

## 2022-09-28 DIAGNOSIS — M8000XD Age-related osteoporosis with current pathological fracture, unspecified site, subsequent encounter for fracture with routine healing: Secondary | ICD-10-CM

## 2022-09-28 DIAGNOSIS — Z23 Encounter for immunization: Secondary | ICD-10-CM

## 2022-09-28 DIAGNOSIS — H20021 Recurrent acute iridocyclitis, right eye: Secondary | ICD-10-CM

## 2022-09-28 DIAGNOSIS — E1165 Type 2 diabetes mellitus with hyperglycemia: Secondary | ICD-10-CM | POA: Diagnosis not present

## 2022-09-28 DIAGNOSIS — E1169 Type 2 diabetes mellitus with other specified complication: Secondary | ICD-10-CM

## 2022-09-28 DIAGNOSIS — Z0000 Encounter for general adult medical examination without abnormal findings: Secondary | ICD-10-CM

## 2022-09-28 DIAGNOSIS — Z7985 Long-term (current) use of injectable non-insulin antidiabetic drugs: Secondary | ICD-10-CM | POA: Diagnosis not present

## 2022-09-28 DIAGNOSIS — Z6830 Body mass index (BMI) 30.0-30.9, adult: Secondary | ICD-10-CM

## 2022-09-28 DIAGNOSIS — F988 Other specified behavioral and emotional disorders with onset usually occurring in childhood and adolescence: Secondary | ICD-10-CM | POA: Diagnosis not present

## 2022-09-28 DIAGNOSIS — K219 Gastro-esophageal reflux disease without esophagitis: Secondary | ICD-10-CM

## 2022-09-28 DIAGNOSIS — E559 Vitamin D deficiency, unspecified: Secondary | ICD-10-CM

## 2022-09-28 DIAGNOSIS — F418 Other specified anxiety disorders: Secondary | ICD-10-CM

## 2022-09-28 DIAGNOSIS — J4520 Mild intermittent asthma, uncomplicated: Secondary | ICD-10-CM

## 2022-09-28 DIAGNOSIS — E785 Hyperlipidemia, unspecified: Secondary | ICD-10-CM | POA: Diagnosis not present

## 2022-09-28 DIAGNOSIS — I700 Atherosclerosis of aorta: Secondary | ICD-10-CM

## 2022-09-28 DIAGNOSIS — Z79899 Other long term (current) drug therapy: Secondary | ICD-10-CM

## 2022-09-28 DIAGNOSIS — I10 Essential (primary) hypertension: Secondary | ICD-10-CM

## 2022-09-28 MED ORDER — OZEMPIC (0.25 OR 0.5 MG/DOSE) 2 MG/3ML ~~LOC~~ SOPN: 0.5000 mg | PEN_INJECTOR | SUBCUTANEOUS | 1 refills | Status: DC

## 2022-09-28 NOTE — Assessment & Plan Note (Signed)
Bp is improving BP: 128/84  =  Plan to continue Carvedilol 12.5 mg bid   (have room to inc this further in the future if needed since pulse is not low)  Chlorthalidone 25 mg daily  Encouraged weight loss and healthy lifestyle

## 2022-09-28 NOTE — Assessment & Plan Note (Signed)
Reviewed health habits including diet and exercise and skin cancer prevention Reviewed appropriate screening tests for age  Also reviewed health mt list, fam hx and immunization status , as well as social and family history   See HPI Labs reviewed and ordered Eye exam utd  Will check with pharmacy re: tetanus shot and shingrix  Flu shot and prevnar 20 given today  Colonoscopy utd 2020  Mammogram scheduled nov 2024 No new falls or fracture Discussed fall prevention, supplements and exercise for bone density  PHQ 0

## 2022-09-28 NOTE — Assessment & Plan Note (Signed)
Prenar 20 vaccine updated  Continues symbicort

## 2022-09-28 NOTE — Assessment & Plan Note (Signed)
Continues humira and methotrexate

## 2022-09-28 NOTE — Assessment & Plan Note (Signed)
Continues humira and methotrexate from oph  Updated flu and prevnar vaccines today

## 2022-09-28 NOTE — Assessment & Plan Note (Signed)
Last vitamin D Lab Results  Component Value Date   VD25OH 47.43 09/21/2022   Vitamin D level is therapeutic with current supplementation Disc importance of this to bone and overall health

## 2022-09-28 NOTE — Assessment & Plan Note (Signed)
Lab Results  Component Value Date   HGBA1C 8.9 (A) 08/18/2022   Now on semaglutide 0.5 mg weekly  Some nausea- wants to try it longer Is losing weight  Eye exam utd Microalb utd  Metformin 1000 mg bid  Will continue to check blood sugars

## 2022-09-28 NOTE — Assessment & Plan Note (Signed)
Continues adderall 20 mg bid  Working well

## 2022-09-28 NOTE — Progress Notes (Signed)
Subjective:    Patient ID: [name], female    DOB: 11-01-1965, 57 y.o.   MRN: [id number]  HPI  Here for health maintenance exam and to review chronic medical problems   Wt Readings from Last 3 Encounters:  09/28/22 178 lb 6 oz (80.9 kg)  08/18/22 192 lb 4 oz (87.2 kg)  08/05/22 195 lb (88.5 kg)   29.01 kg/m  Vitals:   09/28/22 1419  BP: 128/84  Pulse: 90  Temp: 97.9 F (36.6 C)  SpO2: 97%    Immunization History  Administered Date(s) Administered   Influenza Split 10/29/2010, 10/02/2011   Influenza Whole 10/13/2006, 10/03/2008, 10/09/2009   Influenza, Seasonal, Injecte, Preservative Fre 09/28/2022   Influenza,inj,Quad PF,6+ Mos 09/14/2012, 10/11/2013, 09/12/2015, 11/02/2016, 09/17/2017, 09/14/2018, 10/09/2019, 09/24/2021   Influenza-Unspecified 09/18/2014   PFIZER(Purple Top)SARS-COV-2 Vaccination 09/08/2019, 09/29/2019, 03/20/2020   PNEUMOCOCCAL CONJUGATE-20 09/28/2022   PPD Test 03/24/2012   Pneumococcal Polysaccharide-23 11/02/2012   Tdap 07/20/2011    Health Maintenance Due  Topic Date Due   HIV Screening  Never done   Hepatitis C Screening  Never done   MAMMOGRAM  12/26/2020   DTaP/Tdap/Td (2 - Td or Tdap) 07/19/2021   COVID-19 Vaccine (4 - 2023-24 season) 09/06/2022   FOOT EXAM  09/25/2022   Eye exam 08/21/22  Has them frequently for iritis  No DM retinop   Tetanus shot -will check with pharmacy   Flu shot -wants to get today   Shingrix -interested if covered    Mammogram- scheduled for nov 8th  Self breast exam-no lumps   Gyn health Hysterectomy     Colon cancer screening   colonoscopy 09/2018   Bone health  Dexa 09/2021 -osteopenia  Osteoporosis -dx in light of fragility fractures  Holding off on medication due to dental work needed  Took 2-3 pills of fosamax / dentist told her to stop  [employer]  Last vitamin D Lab Results  Component Value Date   VD25OH 47.43 09/21/2022    Exercise : not  a lot with myofascial pain disorder      Mood    08/06/2022    1:35 PM 03/25/2022    8:09 AM 02/13/2022    9:01 AM 05/14/2021    1:36 PM 10/07/2020    2:33 PM  Depression screen PHQ 2/9  Decreased Interest 0 1 0 0 0  Down, Depressed, Hopeless 0 0 0 0 0  PHQ - 2 Score 0 1 0 0 0  Altered sleeping  0 1  0  Tired, decreased energy  0 1  1  Change in appetite  0 0  0  Feeling bad or failure about yourself   0 0  0  Trouble concentrating  0 2  3  Moving slowly or fidgety/restless  0 0  0  Suicidal thoughts  0 0  0  PHQ-9 Score  1 4  4   Difficult doing work/chores  Not difficult at all Not difficult at all  Not difficult at all  Continues wellbutrin for depression   Takes methotrexate Also humira  For iritis    HTN bp is stable today  No cp or palpitations or headaches or edema  No side effects to medicines  BP Readings from Last 3 Encounters:  09/28/22 128/84  08/18/22 131/80  07/29/22 (!) 144/88    Carvedilol 12.5 mg bid  Chlorthalidone 25 mg daily   Lab Results  Component Value Date   NA 137 09/21/2022  K 4.0 09/21/2022   CO2 21 09/21/2022   GLUCOSE 117 (H) 09/21/2022   BUN 12 09/21/2022   CREATININE 0.91 09/21/2022   CALCIUM 9.6 09/21/2022   GFR 71.15 09/21/2022   GFRNONAA 47 (L) 06/26/2022   Lab Results  Component Value Date   ALT 17 09/21/2022   AST 45 (H) 09/21/2022   ALKPHOS 68 09/21/2022   BILITOT 0.7 09/21/2022   Lab Results  Component Value Date   WBC 10.0 09/22/2022   HGB 13.6 09/22/2022   HCT 42.1 09/22/2022   MCV 87.2 09/22/2022   PLT 260.0 09/22/2022    Takes nexium for GERD Lab Results  Component Value Date   VITAMINB12 214 09/21/2022    DM2 Lab Results  Component Value Date   HGBA1C 8.9 (A) 08/18/2022   Metformin 1000 mg bid  Discussed GLP -started semaglutide 0.5 mg weekly  Has some nausea with it (no vomiting)  Has had 6 doses (middle of the 0.5)   Glucose readings are improved  Dropped below 100 once   This has helped  appetite and sugar craving     Lab Results  Component Value Date   MICROALBUR 2.2 (H) 09/21/2022   MICROALBUR 1.1 09/17/2021     Hyperlipidemia Lab Results  Component Value Date   CHOL 118 09/21/2022   CHOL 111 05/11/2022   CHOL 172 09/17/2021   Lab Results  Component Value Date   HDL 52.70 09/21/2022   HDL 43.70 05/11/2022   HDL 55.60 09/17/2021   Lab Results  Component Value Date   LDLCALC 40 09/21/2022   LDLCALC 46 05/11/2022   LDLCALC 83 09/17/2021   Lab Results  Component Value Date   TRIG 126.0 09/21/2022   TRIG 105.0 05/11/2022   TRIG 164.0 (H) 09/17/2021   Lab Results  Component Value Date   CHOLHDL 2 09/21/2022   CHOLHDL 3 05/11/2022   CHOLHDL 3 09/17/2021   No results found for: "LDLDIRECT" Crestor 5 mg daily   Hypothyroidism  Pt has no clinical changes No change in energy level/ hair or skin/ edema and no tremor Lab Results  Component Value Date   TSH 6.82 (H) 09/21/2022     Last time decided to stop levothyroxine due to no symptoms     Patient Active Problem List   Diagnosis Date Noted   Current use of proton pump inhibitor 09/20/2022   Piriformis muscle pain 07/29/2022   Post-viral cough syndrome 03/25/2022   Aortic atherosclerosis (HCC) 03/25/2022   Fractures, stress 12/26/2021   Immunosuppressed status (HCC) 11/19/2021   Encounter for screening mammogram for breast cancer 09/24/2021   Osteoporosis 09/24/2021   Estrogen deficiency 09/01/2021   History of DVT (deep vein thrombosis) 07/11/2021   Closed fracture of right tibial plateau 07/10/2021   Sleep apnea 05/14/2021   BMI 30.0-30.9,adult 07/10/2020   Hyperlipidemia associated with type 2 diabetes mellitus (HCC) 07/10/2019   Cystoid macular edema of both eyes 06/16/2019   Vitamin D deficiency 06/16/2019   Type 2 diabetes mellitus with hyperglycemia (HCC) 06/06/2019   Peripheral edema 10/30/2017   Chronic rhinitis 08/10/2017   Cough 06/06/2017   Welcome to Medicare preventive  visit 04/20/2017   Chronic low back pain 12/09/2015   Degeneration of lumbar or lumbosacral intervertebral disc 12/09/2015   Hypothyroid 01/24/2014   Pseudophakia of both eyes 12/15/2013   Nasal septal deviation 01/03/2013   Encounter for Medicare annual wellness exam 11/02/2012   Hydradenitis 11/02/2012   Cystoid macular degeneration of retina 05/13/2012  Macular hole 03/23/2012   Screening-pulmonary TB 03/23/2012   Routine general medical examination at a health care facility 10/02/2011   Iritis, recurrent 10/02/2011   Tachycardia 10/02/2011   ADD (attention deficit disorder) 08/20/2010   DYSHIDROTIC ECZEMA, HANDS 11/12/2009   Obstructive sleep apnea 11/19/2006   Depression with anxiety 09/29/2006   Essential hypertension 09/29/2006   Seasonal and perennial allergic rhinitis 09/29/2006   Asthma, mild intermittent, well-controlled 09/29/2006   GERD 09/29/2006   IBS 09/29/2006   ENDOMETRIOSIS 09/29/2006   FEMALE INFERTILITY 09/29/2006   Fibromyalgia 09/29/2006   INSOMNIA 09/29/2006   Past Medical History:  Diagnosis Date   ADHD (attention deficit hyperactivity disorder)    ADD - no meds   Allergy    allergic rhinitis   Anxiety    Asthma    Cataract 2015?   Already had them removed   Chronic headaches    Degenerative disc disease    Depression    Diabetes mellitus without complication (HCC)    type 2   Eczema    no current problems as of 07/09/21   Fatty liver    Fibromyalgia    Fx of fibula 2009   GERD (gastroesophageal reflux disease)    Hemorrhoids    Hypertension    Hyperthyroidism    Hypothyroidism    IBS (irritable bowel syndrome)    Iritis, chronic    Macular hole of left eye 06/2017   PONV (postoperative nausea and vomiting)    Right leg DVT (HCC)    Sleep apnea    uses CPAP nightly   Vitamin D insufficiency 06/16/2019   Past Surgical History:  Procedure Laterality Date   ABDOMINAL HYSTERECTOMY     ANTERIOR CRUCIATE LIGAMENT REPAIR Left 1996    small tear of ACL   BREAST EXCISIONAL BIOPSY Left 1995   BREAST SURGERY     left nipple removed - benign tumor   BUNIONECTOMY Bilateral    COLONOSCOPY     EYE SURGERY     cataracts removed, macular hole repaired left eye- both surgeries [facility]   FRACTURE SURGERY  July 2023   [place] plateau   LAPAROSCOPY  08/2004   endometriosis - several    ORIF TIBIA FRACTURE Right 07/10/2021   Procedure: OPEN REDUCTION INTERNAL FIXATION (ORIF) TIBIAL PLATEAU FRACTURE;  Surgeon: Myrene Galas, MD;  Location: [facility] OR;  Service: Orthopedics;  Laterality: Right;   WISDOM TOOTH EXTRACTION     Social History   Tobacco Use   Smoking status: Never   Smokeless tobacco: Never  Vaping Use   Vaping status: Never Used  Substance Use Topics   Alcohol use: Never   Drug use: Never   Family History  Problem Relation Age of Onset   Hypertension Mother    Thyroid disease Mother    Fibromyalgia Mother    Colon polyps Mother    Arthritis Mother    Diabetes Mother    Diabetes Father    Stroke Father    Kidney cancer Maternal Grandmother    Colon cancer Neg Hx    Esophageal cancer Neg Hx    Stomach cancer Neg Hx    Rectal cancer Neg Hx    Allergic rhinitis Neg Hx    Angioedema Neg Hx    Asthma Neg Hx    Atopy Neg Hx    Eczema Neg Hx    Immunodeficiency Neg Hx    Urticaria Neg Hx    Allergies  Allergen Reactions   Amlodipine  Pedal edema    Cetirizine Hcl Other (See Comments)    Not effective   Erythromycin Nausea Only and Other (See Comments)    Caused a stomach ache   Escitalopram Oxalate Other (See Comments)    "No improvement"   Lisinopril Cough   Loratadine Other (See Comments)    "Not effective"   Nifedipine Other (See Comments)    "Fatigue and tremors"   Sertraline Hcl Other (See Comments)    "Did not help"   Vyvanse [Lisdexamfetamine Dimesylate]     Headache and excitablility   Current Outpatient Medications on File Prior to Visit  Medication Sig Dispense Refill    acetaminophen (TYLENOL) 500 MG tablet Take 1 tablet (500 mg total) by mouth every 12 (twelve) hours. 60 tablet 0   ADVIL 200 MG CAPS Take 400 mg by mouth every 6 (six) hours as needed (for mild pain).     albuterol (PROVENTIL) (2.5 MG/3ML) 0.083% nebulizer solution Take 3 mLs (2.5 mg total) by nebulization every 4 (four) hours as needed for wheezing or shortness of breath. 75 mL 1   albuterol (VENTOLIN HFA) 108 (90 Base) MCG/ACT inhaler INHALE 2 PUFFS BY MOUTH EVERY 6 HOURS AS NEEDED FOR WHEEZING OR SHORTNESS OF BREATH 18 g 0   amphetamine-dextroamphetamine (ADDERALL) 20 MG tablet Take 1 tablet (20 mg total) by mouth 2 (two) times daily. 60 tablet 0   BD VEO INSULIN SYRINGE U/F 31G X 15/64" 1 ML MISC      budesonide-formoterol (SYMBICORT) 160-4.5 MCG/ACT inhaler Inhale 2 puffs into the lungs 2 (two) times daily as needed. 1 each 5   buPROPion (WELLBUTRIN XL) 300 MG 24 hr tablet Take 1 tablet (300 mg total) by mouth daily. 90 tablet 3   CALCIUM-VITAMIN D PO Take 1 capsule by mouth in the morning and at bedtime.     carvedilol (COREG) 6.25 MG tablet Take 6.25 mg by mouth 2 (two) times daily.     cetirizine (ZYRTEC) 10 MG tablet Take 10 mg by mouth daily as needed for allergies or rhinitis.     chlorthalidone (HYGROTON) 25 MG tablet Take 1 tablet by mouth once daily 90 tablet 0   diphenhydrAMINE (BENADRYL) 25 mg capsule Take 25-50 mg by mouth every 6 (six) hours as needed for allergies.     esomeprazole (NEXIUM) 20 MG capsule Take 1 capsule (20 mg total) by mouth at bedtime. 90 capsule 3   folic acid (FOLVITE) 1 MG tablet Take 3 mg by mouth See admin instructions. Take 3 mg by mouth once a day on Wed/Thurs/Fri/Sat     glucose blood (ONETOUCH VERIO) test strip USE  STRIP TO CHECK GLUCOSE TWICE DAILY AND AS NEEDED FOR DIABETES 100 each 1   HUMIRA PEN 40 MG/0.8ML PNKT Inject 40 mg into the skin every Monday.     ketorolac (ACULAR) 0.5 % ophthalmic solution Place 1 drop into the left eye 2 (two) times  daily.     Lancets (ONETOUCH DELICA PLUS LANCET33G) MISC Use to check blood sugar twice daily (dx. E11.65) 100 each 1   leucovorin (WELLCOVORIN) 5 MG tablet Take 5 mg by mouth See admin instructions. Take 5 mg by mouth once a day on only Sundays and Tuesdays     MAGNESIUM GLYCINATE PO Take 1 capsule by mouth at bedtime.     metFORMIN (GLUCOPHAGE) 1000 MG tablet Take 1 tablet (1,000 mg total) by mouth 2 (two) times daily with a meal. 180 tablet 3   methocarbamol (ROBAXIN) 500 MG  tablet Take 1 tablet (500 mg total) by mouth every 8 (eight) hours as needed for muscle spasms (back pain). Caution of sedation 30 tablet 3   Methotrexate 25 MG/ML SOSY Inject 17.5 mg into the skin every Monday.     methotrexate 50 MG/2ML injection SMARTSIG:0.9 Milliliter(s) SUB-Q Once a Week     prednisoLONE acetate (PRED FORTE) 1 % ophthalmic suspension Place 4 drops into the left eye 2 (two) times daily.     rosuvastatin (CRESTOR) 5 MG tablet Take 1 tablet (5 mg total) by mouth daily. 90 tablet 3   traMADol (ULTRAM) 50 MG tablet Take 1 tablet (50 mg total) by mouth every 6 (six) hours as needed for severe pain (for fibromyalgia flares and back pain). 60 tablet 0   No current facility-administered medications on file prior to visit.    Review of Systems  Constitutional:  Positive for fatigue. Negative for activity change, appetite change, fever and unexpected weight change.  HENT:  Negative for congestion, ear pain, rhinorrhea, sinus pressure and sore throat.   Eyes:  Negative for pain, redness and visual disturbance.  Respiratory:  Negative for cough, shortness of breath and wheezing.   Cardiovascular:  Negative for chest pain and palpitations.  Gastrointestinal:  Negative for abdominal pain, blood in stool, constipation and diarrhea.  Endocrine: Negative for polydipsia and polyuria.  Genitourinary:  Negative for dysuria, frequency and urgency.  Musculoskeletal:  Positive for arthralgias and myalgias. Negative for  back pain.  Skin:  Negative for pallor and rash.  Allergic/Immunologic: Negative for environmental allergies.  Neurological:  Negative for dizziness, syncope and headaches.  Hematological:  Negative for adenopathy. Does not bruise/bleed easily.  Psychiatric/Behavioral:  Negative for decreased concentration and dysphoric mood. The patient is not nervous/anxious.        Objective:   Physical Exam Constitutional:      General: She is not in acute distress.    Appearance: Normal appearance. She is well-developed. She is obese. She is not ill-appearing or diaphoretic.  HENT:     Head: Normocephalic and atraumatic.     Right Ear: Tympanic membrane, ear canal and external ear normal.     Left Ear: Tympanic membrane, ear canal and external ear normal.     Nose: Nose normal. No congestion.     Mouth/Throat:     Mouth: Mucous membranes are moist.     Pharynx: Oropharynx is clear. No posterior oropharyngeal erythema.  Eyes:     General: No scleral icterus.    Extraocular Movements: Extraocular movements intact.     Conjunctiva/sclera: Conjunctivae normal.     Pupils: Pupils are equal, round, and reactive to light.  Neck:     Thyroid: No thyromegaly.     Vascular: No carotid bruit or JVD.  Cardiovascular:     Rate and Rhythm: Normal rate and regular rhythm.     Pulses: Normal pulses.     Heart sounds: Normal heart sounds.     No gallop.  Pulmonary:     Effort: Pulmonary effort is normal. No respiratory distress.     Breath sounds: Normal breath sounds. No wheezing.     Comments: Good air exch Chest:     Chest wall: No tenderness.  Abdominal:     General: Bowel sounds are normal. There is no distension or abdominal bruit.     Palpations: Abdomen is soft. There is no mass.     Tenderness: There is no abdominal tenderness.     Hernia: No hernia is  present.  Genitourinary:    Comments: Breast exam: No mass, nodules, thickening, tenderness, bulging, retraction, inflamation, nipple  discharge or skin changes noted.  No axillary or clavicular LA.     Musculoskeletal:        General: No tenderness. Normal range of motion.     Cervical back: Normal range of motion and neck supple. No rigidity. No muscular tenderness.     Right lower leg: No edema.     Left lower leg: No edema.     Comments: No kyphosis   Lymphadenopathy:     Cervical: No cervical adenopathy.  Skin:    General: Skin is warm and dry.     Coloration: Skin is not pale.     Findings: No erythema or rash.     Comments: Solar lentigines diffusely   Neurological:     Mental Status: She is alert. Mental status is at baseline.     Cranial Nerves: No cranial nerve deficit.     Motor: No abnormal muscle tone.     Coordination: Coordination normal.     Gait: Gait normal.     Deep Tendon Reflexes: Reflexes are normal and symmetric. Reflexes normal.  Psychiatric:        Mood and Affect: Mood normal.        Cognition and Memory: Cognition and memory normal.           Assessment & Plan:   Problem List Items Addressed This Visit       Cardiovascular and Mediastinum   Aortic atherosclerosis (HCC)    Continue to aim for good HTN and lipid control  No symptoms /clinical changes       Essential hypertension    Bp is improving BP: 128/84  =  Plan to continue Carvedilol 12.5 mg bid   (have room to inc this further in the future if needed since pulse is not low)  Chlorthalidone 25 mg daily  Encouraged weight loss and healthy lifestyle          Respiratory   Asthma, mild intermittent, well-controlled    Prenar 20 vaccine updated  Continues symbicort         Digestive   GERD    Continues nexium 20  Following renal fxn and vit levels         Endocrine   Hyperlipidemia associated with type 2 diabetes mellitus (HCC)    Disc goals for lipids and reasons to control them Rev last labs with pt Rev low sat fat diet in detail  Crestor 5 mg daily   LLD Is 40- well controlled        Relevant Medications   Semaglutide,0.25 or 0.5MG /DOS, (OZEMPIC, 0.25 OR 0.5 MG/DOSE,) 2 MG/3ML SOPN   Type 2 diabetes mellitus with hyperglycemia (HCC)    Lab Results  Component Value Date   HGBA1C 8.9 (A) 08/18/2022   Now on semaglutide 0.5 mg weekly  Some nausea- wants to try it longer Is losing weight  Eye exam utd Microalb utd  Metformin 1000 mg bid  Will continue to check blood sugars       Relevant Medications   Semaglutide,0.25 or 0.5MG /DOS, (OZEMPIC, 0.25 OR 0.5 MG/DOSE,) 2 MG/3ML SOPN     Musculoskeletal and Integument   Osteoporosis    Was told by dentist to hold aldendronate until dental issues are resolved Will let us know when re starting No new falls or fracture   Discussed fall prevention, supplements and exercise for bone density  Other   ADD (attention deficit disorder)    Continues adderall 20 mg bid Working well       BMI 30.0-30.9,adult    Discussed how this problem influences overall health and the risks it imposes  Reviewed plan for weight loss with lower calorie diet (via better food choices (lower glycemic and portion control) along with exercise building up to or more than 30 minutes 5 days per week including some aerobic activity and strength training    Semaglutide is helping weight loss       Current use of proton pump inhibitor    Lab Results  Component Value Date   VITAMINB12 214 09/21/2022   Low normal - will continue to watch  Last vitamin D Lab Results  Component Value Date   VD25OH 47.43 09/21/2022         Depression with anxiety    Continues wellbutrin  Wants to continue this Encouraged good self care       Immunosuppressed status (HCC)    Continues humira and methotrexate from oph  Updated flu and prevnar vaccines today      Iritis, recurrent    Continues humira and methotrexate       Routine general medical examination at a health care facility - Primary    Reviewed health habits including diet  and exercise and skin cancer prevention Reviewed appropriate screening tests for age  Also reviewed health mt list, fam hx and immunization status , as well as social and family history   See HPI Labs reviewed and ordered Eye exam utd  Will check with pharmacy re: tetanus shot and shingrix  Flu shot and prevnar 20 given today  Colonoscopy utd 2020  Mammogram scheduled nov 2024 No new falls or fracture Discussed fall prevention, supplements and exercise for bone density  PHQ 0        Vitamin D deficiency    Last vitamin D Lab Results  Component Value Date   VD25OH 47.43 09/21/2022   Vitamin D level is therapeutic with current supplementation Disc importance of this to bone and overall health       Other Visit Diagnoses     Need for pneumococcal 20-valent conjugate vaccination       Relevant Orders   Pneumococcal conjugate vaccine 20-valent (Prevnar 20) (Completed)   Need for influenza vaccination       Relevant Orders   Flu vaccine trivalent PF, 6mos and older(Flulaval,Afluria,Fluarix,Fluzone) (Completed)

## 2022-09-28 NOTE — Assessment & Plan Note (Signed)
Continues nexium 20  Following renal fxn and vit levels

## 2022-09-28 NOTE — Assessment & Plan Note (Signed)
Was told by dentist to hold aldendronate until dental issues are resolved Will let us know when re starting No new falls or fracture   Discussed fall prevention, supplements and exercise for bone density

## 2022-09-28 NOTE — Assessment & Plan Note (Signed)
Disc goals for lipids and reasons to control them Rev last labs with pt Rev low sat fat diet in detail  Crestor 5 mg daily   LLD Is 40- well controlled

## 2022-09-28 NOTE — Patient Instructions (Addendum)
Keep me posted re: nausea with semaglutide  Let  me know if nausea does not improve with the 0.5 mg   If you get low glucose readings (below 80) - let me know   Don't skip meals / eat less and eat better  Try to get most of your carbohydrates from produce (with the exception of white potatoes) and whole grains Eat less bread/pasta/rice/snack foods/cereals/sweets and other items from the middle of the grocery store (processed carbs)  If you are interested in the shingles vaccine series (Shingrix), call your insurance or pharmacy to check on coverage and location it must be given.  If affordable - you can schedule it here or at your pharmacy depending on coverage  You can ask eye doctor also  I would separate by other vaccines by a month    You are due for a tetanus shot the next time you are at the pharmacy   Today- flu shot and pneumonia shot   Get your dental problems taken care of  Then get back on alendronate if you can   If able Add some strength training to your routine, this is important for bone and brain health and can reduce your risk of falls and help your body use insulin properly and regulate weight  Light weights, exercise bands , and internet videos are a good way to start  Yoga (chair or regular), machines , floor exercises or a gym with machines are also good options      Schedule follow up after nov 13th

## 2022-09-28 NOTE — Assessment & Plan Note (Signed)
Continues wellbutrin  Wants to continue this Encouraged good self care

## 2022-09-28 NOTE — Assessment & Plan Note (Signed)
Continue to aim for good HTN and lipid control  No symptoms /clinical changes

## 2022-09-28 NOTE — Assessment & Plan Note (Signed)
Lab Results  Component Value Date   VITAMINB12 214 09/21/2022   Low normal - will continue to watch  Last vitamin D Lab Results  Component Value Date   VD25OH 47.43 09/21/2022

## 2022-09-28 NOTE — Assessment & Plan Note (Signed)
Discussed how this problem influences overall health and the risks it imposes  Reviewed plan for weight loss with lower calorie diet (via better food choices (lower glycemic and portion control) along with exercise building up to or more than 30 minutes 5 days per week including some aerobic activity and strength training    Semaglutide is helping weight loss

## 2022-09-29 ENCOUNTER — Inpatient Hospital Stay: Admission: RE | Admit: 2022-09-29 | Payer: Medicare HMO | Source: Ambulatory Visit

## 2022-10-02 DIAGNOSIS — G4733 Obstructive sleep apnea (adult) (pediatric): Secondary | ICD-10-CM | POA: Diagnosis not present

## 2022-10-22 ENCOUNTER — Other Ambulatory Visit: Payer: Self-pay | Admitting: Family Medicine

## 2022-10-22 ENCOUNTER — Encounter: Payer: Self-pay | Admitting: Family Medicine

## 2022-10-22 MED ORDER — SEMAGLUTIDE (1 MG/DOSE) 4 MG/3ML ~~LOC~~ SOPN: 1.0000 mg | PEN_INJECTOR | SUBCUTANEOUS | 0 refills | Status: DC

## 2022-10-23 ENCOUNTER — Other Ambulatory Visit: Payer: Self-pay | Admitting: Family Medicine

## 2022-10-26 ENCOUNTER — Other Ambulatory Visit: Payer: Self-pay | Admitting: Family Medicine

## 2022-11-01 DIAGNOSIS — G4733 Obstructive sleep apnea (adult) (pediatric): Secondary | ICD-10-CM | POA: Diagnosis not present

## 2022-11-13 ENCOUNTER — Other Ambulatory Visit: Payer: Self-pay | Admitting: Family Medicine

## 2022-11-13 MED ORDER — SEMAGLUTIDE (2 MG/DOSE) 8 MG/3ML ~~LOC~~ SOPN: 2.0000 mg | PEN_INJECTOR | SUBCUTANEOUS | 3 refills | Status: DC

## 2022-11-13 NOTE — Telephone Encounter (Signed)
Sent mychart message letting pt know Dr. Tower's comments  

## 2022-11-13 NOTE — Telephone Encounter (Signed)
I sent in the 2 mg  This is the max dose  Alert [place] if any issues

## 2022-11-17 ENCOUNTER — Other Ambulatory Visit: Payer: Self-pay | Admitting: Family Medicine

## 2022-11-17 DIAGNOSIS — Z1231 Encounter for screening mammogram for malignant neoplasm of breast: Secondary | ICD-10-CM

## 2022-11-18 NOTE — Progress Notes (Unsigned)
Patient ID: [name], female    DOB: Jun 23, 1965, 57 y.o.   MRN: [id number]  HPI F followed for obstructive sleep apnea and allergic rhinitis, asthma, complicated by insomnia, GERD, depression. NPSG 10/08/06 AHI 33/hr.  ------------------------------------------  11/17/21- 57 year old female never smoker followed for OSA, complicated by Allergic rhinitis, Asthma, insomnia, GERD, depression, DM2,  chronic iritis/[facility]/methotrexate/ Humira,  -Albuterol hfa, Symbicort 160, Neb albuterol, MTX,  CPAP auto 5-15/[facility]                 Machine replaced in 2019 Download- compliance 100%, AHI 1.2/ hr Body weight today 190 lbs Covid vax-3 Phizer Flu vax- had -----No issues with cpap Download reviewed. Sleep is good. On augmentin now for sinusitis. Asthma is managed by her Allergist. Broke R tibia - still doing PT.  11/19/22- 11/17/21- 57 year old female never smoker followed for OSA, complicated by Allergic rhinitis, Asthma (Dr Dellis Anes), insomnia, GERD, depression, DM2, Aortic Atherosclerosis, Sclerosing Mesenteritis, Uveitis, Chronic Iritis/[facility]/methotrexate/ Humira,  - Adderall 20, Albuterol hfa, Symbicort 160, Neb albuterol, MTX,  CPAP auto 5-15/[facility]                 Machine replaced in 2019 Download- compliance 100%, AHI 1.6/hr Body weight today 179 lbs Had flu vax.  CXR 03/25/22-  IMPRESSION: 1. No active cardiopulmonary disease. 2.  Aortic Atherosclerosis (ICD10-I70.0).  Review of Systems- see HPI   + = positive Constitutional:   No-   weight loss, night sweats, fevers, chills, fatigue, lassitude. HEENT:   No-  headaches, difficulty swallowing, tooth/dental problems, sore throat,       No- sneezing, itching, ear ache, +nasal congestion, little post nasal drip,  CV:  No-   chest pain, orthopnea, PND, swelling in lower extremities, anasarca, dizziness, palpitations Resp: No-   shortness of breath with exertion or at rest.              No-   productive cough, + non-productive  cough,  No- coughing up of blood.              No-   change in color of mucus.  No- wheezing.   Skin: No-   rash or lesions. GI:  No-   heartburn, indigestion, abdominal pain, nausea, vomiting,  GU: MS:   Neuro-     nothing unusual Psych:  No- change in mood or affect. No depression or anxiety.  No memory loss.  Objective:   Physical Exam General- Alert, Oriented, Affect-appropriate, Distress- none acute, + Overweight Skin- +keratosis pilaris Lymphadenopathy- none Head- atraumatic            Eyes- Gross vision intact, PERRLA, conjunctivae clear secretions            Ears- Hearing, canals-normal            Nose- mucosa + clear, turbinate edema, +Septal dev,no- polyps, erosion, perforation             Throat- Mallampati II-III , mucosa clear , drainage- none, tonsils- atrophic Neck- flexible , trachea midline, no stridor , thyroid nl, carotid no bruit Chest - symmetrical excursion , unlabored           Heart/CV- RRR , no murmur , no gallop  , no rub, nl s1 s2                           - JVD- none , edema- none, stasis changes- none, varices- none  Lung- clear to P&A, wheeze- none, cough- none  , dullness-none, rub- none           Chest wall-  Abd-  Br/ Gen/ Rectal- Not done, not indicated Extrem- +healed incision scar R lower leg Neuro- grossly intact to observation

## 2022-11-19 ENCOUNTER — Ambulatory Visit: Payer: Medicare HMO | Admitting: Internal Medicine

## 2022-11-19 ENCOUNTER — Encounter: Payer: Self-pay | Admitting: Internal Medicine

## 2022-11-19 VITALS — BP 118/74 | HR 91 | Ht 67.5 in | Wt 179.8 lb

## 2022-11-19 DIAGNOSIS — G4733 Obstructive sleep apnea (adult) (pediatric): Secondary | ICD-10-CM | POA: Diagnosis not present

## 2022-11-19 DIAGNOSIS — J4520 Mild intermittent asthma, uncomplicated: Secondary | ICD-10-CM | POA: Diagnosis not present

## 2022-11-19 NOTE — Patient Instructions (Signed)
You are doing great with CPAP   Order- DME [facility]- please replace old CPAP machine, continue auto 5-15, mask of choice, humidifier, supplies, Airview/ card

## 2022-11-23 ENCOUNTER — Ambulatory Visit
Admission: RE | Admit: 2022-11-23 | Discharge: 2022-11-23 | Disposition: A | Discharge status: Home / Self Care | Payer: Medicare HMO | Source: Ambulatory Visit | Attending: Family Medicine | Admitting: Family Medicine

## 2022-11-23 DIAGNOSIS — Z1231 Encounter for screening mammogram for malignant neoplasm of breast: Secondary | ICD-10-CM | POA: Diagnosis not present

## 2022-11-30 ENCOUNTER — Ambulatory Visit: Payer: Medicare HMO | Admitting: Family Medicine

## 2022-11-30 ENCOUNTER — Encounter: Payer: Self-pay | Admitting: Family Medicine

## 2022-11-30 VITALS — BP 118/74 | HR 84 | Temp 98.6°F | Ht 67.5 in | Wt 173.2 lb

## 2022-11-30 DIAGNOSIS — E119 Type 2 diabetes mellitus without complications: Secondary | ICD-10-CM | POA: Diagnosis not present

## 2022-11-30 DIAGNOSIS — E785 Hyperlipidemia, unspecified: Secondary | ICD-10-CM

## 2022-11-30 DIAGNOSIS — H9190 Unspecified hearing loss, unspecified ear: Secondary | ICD-10-CM | POA: Insufficient documentation

## 2022-11-30 DIAGNOSIS — E1165 Type 2 diabetes mellitus with hyperglycemia: Secondary | ICD-10-CM

## 2022-11-30 DIAGNOSIS — I10 Essential (primary) hypertension: Secondary | ICD-10-CM

## 2022-11-30 DIAGNOSIS — E1169 Type 2 diabetes mellitus with other specified complication: Secondary | ICD-10-CM

## 2022-11-30 DIAGNOSIS — H9193 Unspecified hearing loss, bilateral: Secondary | ICD-10-CM | POA: Diagnosis not present

## 2022-11-30 DIAGNOSIS — F988 Other specified behavioral and emotional disorders with onset usually occurring in childhood and adolescence: Secondary | ICD-10-CM

## 2022-11-30 DIAGNOSIS — K219 Gastro-esophageal reflux disease without esophagitis: Secondary | ICD-10-CM

## 2022-11-30 DIAGNOSIS — Z7985 Long-term (current) use of injectable non-insulin antidiabetic drugs: Secondary | ICD-10-CM

## 2022-11-30 LAB — POCT GLYCOSYLATED HEMOGLOBIN (HGB A1C): Hemoglobin A1C: 6.4 % — AB (ref 4.0–5.6)

## 2022-11-30 NOTE — Patient Instructions (Addendum)
Add some strength training to your routine, this is important for bone and brain health and can reduce your risk of falls and help your body use insulin properly and regulate weight  Light weights, exercise bands , and internet videos are a good way to start  Yoga (chair or regular), machines , floor exercises or a gym with machines are also good options    Ask pharmacist about tetanus and shingrix shots    Keep eating well  Try to get most of your carbohydrates from produce (with the exception of white potatoes) and whole grains Eat less bread/pasta/rice/snack foods/cereals/sweets and other items from the middle of the grocery store (processed carbs)  A1c and weight are better  Take care of yourself

## 2022-11-30 NOTE — Assessment & Plan Note (Signed)
Bilateral but worse in left ear  Using hearing aides now-very helpful Was told to see ENT for this  Normal exam today   She plans to make an appointment with ENT and let us know if a referral needs to be sent

## 2022-11-30 NOTE — Assessment & Plan Note (Signed)
Disc goals for lipids and reasons to control them Rev last labs with pt Rev low sat fat diet in detail  Crestor 5 mg daily   LLD Is 40- well controlled

## 2022-11-30 NOTE — Assessment & Plan Note (Signed)
Bp is improving BP: 118/74 Down further with weight loss Plan to continue Carvedilol 12.5 mg bid    Chlorthalidone 25 mg daily  Encouraged weight loss and healthy lifestyle

## 2022-11-30 NOTE — Progress Notes (Signed)
Subjective:    Patient ID: [name], female    DOB: 21-May-1965, 57 y.o.   MRN: [id number]  HPI  Wt Readings from Last 3 Encounters:  11/30/22 173 lb 3.2 oz (78.6 kg)  11/19/22 179 lb 12.8 oz (81.6 kg)  09/28/22 178 lb 6 oz (80.9 kg)   26.73 kg/m  Vitals:   11/30/22 0749  BP: 118/74  Pulse: 84  Temp: 98.6 F (37 C)  SpO2: 97%    Pt presents for follow up of DM2, HTN and chronic medical problems  Also hearing problem   HTN bp is stable today  No cp or palpitations or headaches or edema  No side effects to medicines  BP Readings from Last 3 Encounters:  11/30/22 118/74  11/19/22 118/74  09/28/22 128/84    Chlorthalidone 25 mg daily carvedilol 12.5 mg bid   DM2 Lab Results  Component Value Date   HGBA1C 6.4 (A) 11/30/2022   Improved from 8.9 Metformin 1000 mg bid  Semaglutide 2 mg weekly  No problems on it - 2nd dose Friday   Eating less  Gets full faster  No more nausea- is used to it now   Trying to get more protein   Since glucose is lower-less less yeast infections   Lowest glucose was 90    Lab Results  Component Value Date   MICROALBUR 2.2 (H) 09/21/2022   MICROALBUR 1.1 09/17/2021   Hyperlipidemia Lab Results  Component Value Date   CHOL 118 09/21/2022   HDL 52.70 09/21/2022   LDLCALC 40 09/21/2022   TRIG 126.0 09/21/2022   CHOLHDL 2 09/21/2022   Crestor 5 mg daily    Saw audiology Has hearing aides-they help a lot  Some concern re: left ear -worse than it should be  No pain  ? If fluid  Will need to see ENT      Patient Active Problem List   Diagnosis Date Noted   Hearing loss 11/30/2022   Current use of proton pump inhibitor 09/20/2022   Piriformis muscle pain 07/29/2022   Aortic atherosclerosis (HCC) 03/25/2022   Fractures, stress 12/26/2021   Immunosuppressed status (HCC) 11/19/2021   Encounter for screening mammogram for breast cancer 09/24/2021   Osteoporosis 09/24/2021   Estrogen deficiency 09/01/2021    History of DVT (deep vein thrombosis) 07/11/2021   Closed fracture of right tibial plateau 07/10/2021   Sleep apnea 05/14/2021   BMI 30.0-30.9,adult 07/10/2020   Hyperlipidemia associated with type 2 diabetes mellitus (HCC) 07/10/2019   Cystoid macular edema of both eyes 06/16/2019   Vitamin D deficiency 06/16/2019   Diabetes mellitus treated with injections of non-insulin medication (HCC) 06/06/2019   Peripheral edema 10/30/2017   Chronic rhinitis 08/10/2017   Cough 06/06/2017   Welcome to Medicare preventive visit 04/20/2017   Chronic low back pain 12/09/2015   Degeneration of lumbar or lumbosacral intervertebral disc 12/09/2015   Hypothyroid 01/24/2014   Pseudophakia of both eyes 12/15/2013   Nasal septal deviation 01/03/2013   Encounter for Medicare annual wellness exam 11/02/2012   Hydradenitis 11/02/2012   Cystoid macular degeneration of retina 05/13/2012   Macular hole 03/23/2012   Screening-pulmonary TB 03/23/2012   Routine general medical examination at a health care facility 10/02/2011   Iritis, recurrent 10/02/2011   Tachycardia 10/02/2011   ADD (attention deficit disorder) 08/20/2010   DYSHIDROTIC ECZEMA, HANDS 11/12/2009   Obstructive sleep apnea 11/19/2006   Depression with anxiety 09/29/2006   Essential hypertension 09/29/2006   Seasonal and perennial  allergic rhinitis 09/29/2006   Asthma, mild intermittent, well-controlled 09/29/2006   GERD 09/29/2006   IBS 09/29/2006   ENDOMETRIOSIS 09/29/2006   FEMALE INFERTILITY 09/29/2006   Fibromyalgia 09/29/2006   INSOMNIA 09/29/2006   Past Medical History:  Diagnosis Date   ADHD (attention deficit hyperactivity disorder)    ADD - no meds   Allergy    allergic rhinitis   Anxiety    Asthma    Cataract 2015?   Already had them removed   Chronic headaches    Degenerative disc disease    Depression    Diabetes mellitus without complication (HCC)    type 2   Eczema    no current problems as of 07/09/21   Fatty  liver    Fibromyalgia    Fx of fibula 2009   GERD (gastroesophageal reflux disease)    Hemorrhoids    Hypertension    Hyperthyroidism    Hypothyroidism    IBS (irritable bowel syndrome)    Iritis, chronic    Macular hole of left eye 06/2017   PONV (postoperative nausea and vomiting)    Right leg DVT (HCC)    Sleep apnea    uses CPAP nightly   Vitamin D insufficiency 06/16/2019   Past Surgical History:  Procedure Laterality Date   ABDOMINAL HYSTERECTOMY     ANTERIOR CRUCIATE LIGAMENT REPAIR Left 1996   small tear of ACL   BREAST EXCISIONAL BIOPSY Left 1995   BREAST SURGERY     left nipple removed - benign tumor   BUNIONECTOMY Bilateral    COLONOSCOPY     EYE SURGERY     cataracts removed, macular hole repaired left eye- both surgeries done at [facility]   FRACTURE SURGERY  July 2023   Tibia plateau   LAPAROSCOPY  08/2004   endometriosis - several    ORIF TIBIA FRACTURE Right 07/10/2021   Procedure: OPEN REDUCTION INTERNAL FIXATION (ORIF) TIBIAL PLATEAU FRACTURE;  Surgeon: Myrene Galas, MD;  Location: [facility] OR;  Service: Orthopedics;  Laterality: Right;   WISDOM TOOTH EXTRACTION     Social History   Tobacco Use   Smoking status: Never   Smokeless tobacco: Never  Vaping Use   Vaping status: Never Used  Substance Use Topics   Alcohol use: Never   Drug use: Never   Family History  Problem Relation Age of Onset   Hypertension Mother    Thyroid disease Mother    Fibromyalgia Mother    Colon polyps Mother    Arthritis Mother    Diabetes Mother    Diabetes Father    Stroke Father    Kidney cancer Maternal Grandmother    Colon cancer Neg Hx    Esophageal cancer Neg Hx    Stomach cancer Neg Hx    Rectal cancer Neg Hx    Allergic rhinitis Neg Hx    Angioedema Neg Hx    Asthma Neg Hx    Atopy Neg Hx    Eczema Neg Hx    Immunodeficiency Neg Hx    Urticaria Neg Hx    Breast cancer Neg Hx    Allergies  Allergen Reactions   Amlodipine     Pedal edema    Cetirizine  Hcl Other (See Comments)    Not effective   Erythromycin Nausea Only and Other (See Comments)    Caused a stomach ache   Escitalopram Oxalate Other (See Comments)    "No improvement"   Lisinopril Cough   Loratadine Other (See Comments)    "  Not effective"   Nifedipine Other (See Comments)    "Fatigue and tremors"   Sertraline Hcl Other (See Comments)    "Did not help"   Vyvanse [Lisdexamfetamine Dimesylate]     Headache and excitablility   Current Outpatient Medications on File Prior to Visit  Medication Sig Dispense Refill   acetaminophen (TYLENOL) 500 MG tablet Take 1 tablet (500 mg total) by mouth every 12 (twelve) hours. 60 tablet 0   ADVIL 200 MG CAPS Take 400 mg by mouth every 6 (six) hours as needed (for mild pain).     albuterol (PROVENTIL) (2.5 MG/3ML) 0.083% nebulizer solution Take 3 mLs (2.5 mg total) by nebulization every 4 (four) hours as needed for wheezing or shortness of breath. 75 mL 1   albuterol (VENTOLIN HFA) 108 (90 Base) MCG/ACT inhaler INHALE 2 PUFFS BY MOUTH EVERY 6 HOURS AS NEEDED FOR WHEEZING OR SHORTNESS OF BREATH 18 g 0   amphetamine-dextroamphetamine (ADDERALL) 20 MG tablet Take 1 tablet (20 mg total) by mouth 2 (two) times daily. 60 tablet 0   BD VEO INSULIN SYRINGE U/F 31G X 15/64" 1 ML MISC      budesonide-formoterol (SYMBICORT) 160-4.5 MCG/ACT inhaler Inhale 2 puffs into the lungs 2 (two) times daily as needed. 1 each 5   buPROPion (WELLBUTRIN XL) 300 MG 24 hr tablet Take 1 tablet by mouth once daily 90 tablet 3   CALCIUM-VITAMIN D PO Take 1 capsule by mouth in the morning and at bedtime.     carvedilol (COREG) 6.25 MG tablet TAKE 1 TABLET BY MOUTH TWICE DAILY WITH A MEAL 180 tablet 0   cetirizine (ZYRTEC) 10 MG tablet Take 10 mg by mouth daily as needed for allergies or rhinitis.     chlorthalidone (HYGROTON) 25 MG tablet Take 1 tablet by mouth once daily 90 tablet 0   diphenhydrAMINE (BENADRYL) 25 mg capsule Take 25-50 mg by mouth every 6 (six) hours as  needed for allergies.     esomeprazole (NEXIUM) 20 MG capsule Take 1 capsule by mouth at bedtime 90 capsule 0   folic acid (FOLVITE) 1 MG tablet Take 3 mg by mouth See admin instructions. Take 3 mg by mouth once a day on Wed/Thurs/Fri/Sat     glucose blood (ONETOUCH VERIO) test strip USE  STRIP TO CHECK GLUCOSE TWICE DAILY AND AS NEEDED FOR DIABETES 100 each 0   HUMIRA PEN 40 MG/0.8ML PNKT Inject 40 mg into the skin every Monday.     ketorolac (ACULAR) 0.5 % ophthalmic solution Place 1 drop into the left eye 2 (two) times daily.     Lancets (ONETOUCH DELICA PLUS LANCET33G) MISC USE 1 LANCET TO CHECK GLUCOSE TWICE DAILY 100 each 3   leucovorin (WELLCOVORIN) 5 MG tablet Take 5 mg by mouth See admin instructions. Take 5 mg by mouth once a day on only Sundays and Tuesdays     MAGNESIUM GLYCINATE PO Take 1 capsule by mouth at bedtime.     metFORMIN (GLUCOPHAGE) 1000 MG tablet TAKE 1 TABLET BY MOUTH TWICE DAILY WITH A MEAL 180 tablet 0   methocarbamol (ROBAXIN) 500 MG tablet Take 1 tablet (500 mg total) by mouth every 8 (eight) hours as needed for muscle spasms (back pain). Caution of sedation 30 tablet 3   Methotrexate 25 MG/ML SOSY Inject 17.5 mg into the skin every Monday.     methotrexate 50 MG/2ML injection SMARTSIG:0.9 Milliliter(s) SUB-Q Once a Week     prednisoLONE acetate (PRED FORTE) 1 % ophthalmic suspension  Place 4 drops into the left eye 2 (two) times daily.     rosuvastatin (CRESTOR) 5 MG tablet Take 1 tablet (5 mg total) by mouth daily. 90 tablet 3   Semaglutide, 2 MG/DOSE, 8 MG/3ML SOPN Inject 2 mg as directed once a week. 3 mL 3   traMADol (ULTRAM) 50 MG tablet Take 1 tablet (50 mg total) by mouth every 6 (six) hours as needed for severe pain (for fibromyalgia flares and back pain). 60 tablet 0   No current facility-administered medications on file prior to visit.    Review of Systems  Constitutional:  Negative for activity change, appetite change, fatigue, fever and unexpected  weight change.  HENT:  Negative for congestion, ear pain, rhinorrhea, sinus pressure and sore throat.   Eyes:  Negative for pain, redness and visual disturbance.  Respiratory:  Negative for cough, shortness of breath and wheezing.   Cardiovascular:  Negative for chest pain and palpitations.  Gastrointestinal:  Negative for abdominal pain, blood in stool, constipation and diarrhea.  Endocrine: Negative for polydipsia and polyuria.  Genitourinary:  Negative for dysuria, frequency and urgency.  Musculoskeletal:  Negative for arthralgias, back pain and myalgias.  Skin:  Negative for pallor and rash.  Allergic/Immunologic: Negative for environmental allergies.  Neurological:  Negative for dizziness, syncope and headaches.  Hematological:  Negative for adenopathy. Does not bruise/bleed easily.  Psychiatric/Behavioral:  Negative for decreased concentration and dysphoric mood. The patient is not nervous/anxious.        Objective:   Physical Exam Constitutional:      General: She is not in acute distress.    Appearance: Normal appearance. She is well-developed and normal weight. She is not ill-appearing or diaphoretic.  HENT:     Head: Normocephalic and atraumatic.     Right Ear: Tympanic membrane, ear canal and external ear normal.     Left Ear: Tympanic membrane and external ear normal.  Eyes:     Conjunctiva/sclera: Conjunctivae normal.     Pupils: Pupils are equal, round, and reactive to light.  Neck:     Thyroid: No thyromegaly.     Vascular: No carotid bruit or JVD.  Cardiovascular:     Rate and Rhythm: Normal rate and regular rhythm.     Heart sounds: Normal heart sounds.     No gallop.  Pulmonary:     Effort: Pulmonary effort is normal. No respiratory distress.     Breath sounds: Normal breath sounds. No wheezing or rales.  Abdominal:     General: There is no distension or abdominal bruit.     Palpations: Abdomen is soft.  Musculoskeletal:     Cervical back: Normal range of  motion and neck supple.     Right lower leg: No edema.     Left lower leg: No edema.  Lymphadenopathy:     Cervical: No cervical adenopathy.  Skin:    General: Skin is warm and dry.     Coloration: Skin is not pale.     Findings: No rash.  Neurological:     Mental Status: She is alert.     Coordination: Coordination normal.     Deep Tendon Reflexes: Reflexes are normal and symmetric. Reflexes normal.  Psychiatric:        Mood and Affect: Mood normal.           Assessment & Plan:   Problem List Items Addressed This Visit       Cardiovascular and Mediastinum   Essential hypertension  Bp is improving BP: 118/74 Down further with weight loss Plan to continue Carvedilol 12.5 mg bid    Chlorthalidone 25 mg daily  Encouraged weight loss and healthy lifestyle           Endocrine   Hyperlipidemia associated with type 2 diabetes mellitus (HCC)    Disc goals for lipids and reasons to control them Rev last labs with pt Rev low sat fat diet in detail  Crestor 5 mg daily   LLD Is 40- well controlled       Diabetes mellitus treated with injections of non-insulin medication (HCC) - Primary    Lab Results  Component Value Date   HGBA1C 6.4 (A) 11/30/2022   This is down from 8.9  Weight loss noted with semaglutide Pt feels good /tolerating well Plan to continue Semaglutide 2 mg weekly  Metformin 1000 mg bid   Encouraged to add some muscle building exercise if possible Micrlalb utd Normal foot exam  Follow up 6 mo        Nervous and Auditory   Hearing loss    Bilateral but worse in left ear  Using hearing aides now-very helpful Was told to see ENT for this  Normal exam today   She plans to make an appointment with ENT and let us know if a referral needs to be sent

## 2022-11-30 NOTE — Assessment & Plan Note (Signed)
Lab Results  Component Value Date   HGBA1C 6.4 (A) 11/30/2022   This is down from 8.9  Weight loss noted with semaglutide Pt feels good /tolerating well Plan to continue Semaglutide 2 mg weekly  Metformin 1000 mg bid   Encouraged to add some muscle building exercise if possible Micrlalb utd Normal foot exam  Follow up 6 mo

## 2022-12-07 DIAGNOSIS — G4733 Obstructive sleep apnea (adult) (pediatric): Secondary | ICD-10-CM | POA: Diagnosis not present

## 2022-12-08 ENCOUNTER — Encounter: Payer: Self-pay | Admitting: Family Medicine

## 2022-12-08 DIAGNOSIS — H9193 Unspecified hearing loss, bilateral: Secondary | ICD-10-CM

## 2022-12-08 NOTE — Telephone Encounter (Signed)
Not sure who she means by [facility]  I have pended the order Thanks

## 2022-12-09 NOTE — Telephone Encounter (Signed)
Order sent to [facility]

## 2022-12-11 ENCOUNTER — Encounter (INDEPENDENT_AMBULATORY_CARE_PROVIDER_SITE_OTHER): Payer: Self-pay | Admitting: Otolaryngology

## 2022-12-17 ENCOUNTER — Other Ambulatory Visit: Payer: Self-pay | Admitting: Allergy & Immunology

## 2022-12-19 ENCOUNTER — Ambulatory Visit
Admission: EM | Admit: 2022-12-19 | Discharge: 2022-12-19 | Disposition: A | Discharge status: Home / Self Care | Payer: Medicare HMO | Attending: Family Medicine | Admitting: Family Medicine

## 2022-12-19 DIAGNOSIS — J4521 Mild intermittent asthma with (acute) exacerbation: Secondary | ICD-10-CM | POA: Diagnosis not present

## 2022-12-19 DIAGNOSIS — J069 Acute upper respiratory infection, unspecified: Secondary | ICD-10-CM | POA: Diagnosis not present

## 2022-12-19 LAB — POC COVID19/FLU A&B COMBO
Covid Antigen, POC: NEGATIVE
Influenza A Antigen, POC: NEGATIVE
Influenza B Antigen, POC: NEGATIVE

## 2022-12-19 MED ORDER — PROMETHAZINE-DM 6.25-15 MG/5ML PO SYRP
5.0000 mL | ORAL_SOLUTION | Freq: Four times a day (QID) | ORAL | 0 refills | Status: DC | PRN
Start: 2022-12-19 — End: 2023-03-23

## 2022-12-19 MED ORDER — DEXAMETHASONE SODIUM PHOSPHATE 10 MG/ML IJ SOLN
10.0000 mg | Freq: Once | INTRAMUSCULAR | Status: AC
  Administered 2022-12-19: 10 mg via INTRAMUSCULAR

## 2022-12-19 NOTE — ED Triage Notes (Signed)
Pt reports she has a runny nose, nasal congestion, head pressure, "scalp hurts", cough with green mucus, and no appetite x 2 days    Took sudafed

## 2022-12-19 NOTE — ED Provider Notes (Signed)
[facility]    CSN: [id number] Arrival date & time: 12/19/22  1112      History   Chief Complaint No chief complaint on file.   HPI [name] is a 57 y.o. female.   Patient presenting today with 2-day history of nasal congestion, sinus pressure, body aches, cough, and states had an asthma attack 1 day ago ultimately improved with her albuterol inhaler.  She denies fever, chest pain, ongoing shortness of breath, abdominal pain, nausea vomiting or diarrhea.  Taking Sudafed in addition to her inhalers with minimal relief.    Past Medical History:  Diagnosis Date   ADHD (attention deficit hyperactivity disorder)    ADD - no meds   Allergy    allergic rhinitis   Anxiety    Asthma    Cataract 2015?   Already had them removed   Chronic headaches    Degenerative disc disease    Depression    Diabetes mellitus without complication (HCC)    type 2   Eczema    no current problems as of 07/09/21   Fatty liver    Fibromyalgia    Fx of fibula 2009   GERD (gastroesophageal reflux disease)    Hemorrhoids    Hypertension    Hyperthyroidism    Hypothyroidism    IBS (irritable bowel syndrome)    Iritis, chronic    Macular hole of left eye 06/2017   PONV (postoperative nausea and vomiting)    Right leg DVT (HCC)    Sleep apnea    uses CPAP nightly   Vitamin D insufficiency 06/16/2019    Patient Active Problem List   Diagnosis Date Noted   Hearing loss 11/30/2022   Current use of proton pump inhibitor 09/20/2022   Piriformis muscle pain 07/29/2022   Aortic atherosclerosis (HCC) 03/25/2022   Fractures, stress 12/26/2021   Immunosuppressed status (HCC) 11/19/2021   Encounter for screening mammogram for breast cancer 09/24/2021   Osteoporosis 09/24/2021   Estrogen deficiency 09/01/2021   History of DVT (deep vein thrombosis) 07/11/2021   Sleep apnea 05/14/2021   Hyperlipidemia associated with type 2 diabetes mellitus (HCC) 07/10/2019   Cystoid macular  edema of both eyes 06/16/2019   Vitamin D deficiency 06/16/2019   Diabetes mellitus treated with injections of non-insulin medication (HCC) 06/06/2019   Peripheral edema 10/30/2017   Chronic rhinitis 08/10/2017   Welcome to Medicare preventive visit 04/20/2017   Chronic low back pain 12/09/2015   Degeneration of lumbar or lumbosacral intervertebral disc 12/09/2015   Hypothyroid 01/24/2014   Pseudophakia of both eyes 12/15/2013   Nasal septal deviation 01/03/2013   Encounter for Medicare annual wellness exam 11/02/2012   Hydradenitis 11/02/2012   Cystoid macular degeneration of retina 05/13/2012   Macular hole 03/23/2012   Screening-pulmonary TB 03/23/2012   Routine general medical examination at a health care facility 10/02/2011   Iritis, recurrent 10/02/2011   Tachycardia 10/02/2011   ADD (attention deficit disorder) 08/20/2010   DYSHIDROTIC ECZEMA, HANDS 11/12/2009   Obstructive sleep apnea 11/19/2006   Depression with anxiety 09/29/2006   Essential hypertension 09/29/2006   Seasonal and perennial allergic rhinitis 09/29/2006   Asthma, mild intermittent, well-controlled 09/29/2006   GERD 09/29/2006   IBS 09/29/2006   ENDOMETRIOSIS 09/29/2006   FEMALE INFERTILITY 09/29/2006   Fibromyalgia 09/29/2006   INSOMNIA 09/29/2006    Past Surgical History:  Procedure Laterality Date   ABDOMINAL HYSTERECTOMY     ANTERIOR CRUCIATE LIGAMENT REPAIR Left 1996   small tear of  ACL   BREAST EXCISIONAL BIOPSY Left 1995   BREAST SURGERY     left nipple removed - benign tumor   BUNIONECTOMY Bilateral    COLONOSCOPY     EYE SURGERY     cataracts removed, macular hole repaired left eye- both surgeries done at [facility]   FRACTURE SURGERY  July 2023   [place] plateau   LAPAROSCOPY  08/2004   endometriosis - several    ORIF TIBIA FRACTURE Right 07/10/2021   Procedure: OPEN REDUCTION INTERNAL FIXATION (ORIF) TIBIAL PLATEAU FRACTURE;  Surgeon: Myrene Galas, MD;  Location: [facility] OR;  Service:  Orthopedics;  Laterality: Right;   WISDOM TOOTH EXTRACTION      OB History   No obstetric history on file.      Home Medications    Prior to Admission medications   Medication Sig Start Date End Date Taking? Authorizing Provider  promethazine-dextromethorphan (PROMETHAZINE-DM) 6.25-15 MG/5ML syrup Take 5 mLs by mouth 4 (four) times daily as needed. 12/19/22  Yes Particia Nearing, PA-C  acetaminophen (TYLENOL) 500 MG tablet Take 1 tablet (500 mg total) by mouth every 12 (twelve) hours. 07/11/21   Montez Morita, PA-C  ADVIL 200 MG CAPS Take 400 mg by mouth every 6 (six) hours as needed (for mild pain).    [provider]  albuterol (PROVENTIL) (2.5 MG/3ML) 0.083% nebulizer solution Take 3 mLs (2.5 mg total) by nebulization every 4 (four) hours as needed for wheezing or shortness of breath. 02/26/21   Alfonse Spruce, MD  albuterol (VENTOLIN HFA) 108 (90 Base) MCG/ACT inhaler INHALE 2 PUFFS BY MOUTH EVERY 6 HOURS AS NEEDED FOR WHEEZING OR SHORTNESS OF BREATH 12/17/22   Alfonse Spruce, MD  amphetamine-dextroamphetamine (ADDERALL) 20 MG tablet Take 1 tablet (20 mg total) by mouth 2 (two) times daily. 02/13/22   Tower, Audrie Gallus, MD  BD VEO INSULIN SYRINGE U/F 31G X 15/64" 1 ML MISC  05/12/21   [provider]  budesonide-formoterol (SYMBICORT) 160-4.5 MCG/ACT inhaler Inhale 2 puffs into the lungs 2 (two) times daily as needed. 05/27/22   Alfonse Spruce, MD  buPROPion (WELLBUTRIN XL) 300 MG 24 hr tablet Take 1 tablet by mouth once daily 10/26/22   Tower, Audrie Gallus, MD  CALCIUM-VITAMIN D PO Take 1 capsule by mouth in the morning and at bedtime.    [provider]  carvedilol (COREG) 6.25 MG tablet TAKE 1 TABLET BY MOUTH TWICE DAILY WITH A MEAL 11/18/22   Tower, Audrie Gallus, MD  cetirizine (ZYRTEC) 10 MG tablet Take 10 mg by mouth daily as needed for allergies or rhinitis.    [provider]  chlorthalidone (HYGROTON) 25 MG tablet Take 1 tablet by mouth  once daily 11/18/22   Tower, Whiteside A, MD  diphenhydrAMINE (BENADRYL) 25 mg capsule Take 25-50 mg by mouth every 6 (six) hours as needed for allergies.    [provider]  esomeprazole (NEXIUM) 20 MG capsule Take 1 capsule by mouth at bedtime 10/22/22   Tower, Audrie Gallus, MD  folic acid (FOLVITE) 1 MG tablet Take 3 mg by mouth See admin instructions. Take 3 mg by mouth once a day on Wed/Thurs/Fri/Sat    [provider]  glucose blood (ONETOUCH VERIO) test strip USE  STRIP TO CHECK GLUCOSE TWICE DAILY AND AS NEEDED FOR DIABETES 11/18/22   Tower, Audrie Gallus, MD  HUMIRA PEN 40 MG/0.8ML PNKT Inject 40 mg into the skin every Monday. 04/30/21   [provider]  ketorolac (ACULAR) 0.5 %  ophthalmic solution Place 1 drop into the left eye 2 (two) times daily. 11/24/18   [provider]  Lancets (ONETOUCH DELICA PLUS LANCET33G) MISC USE 1 LANCET TO CHECK GLUCOSE TWICE DAILY 10/26/22   Tower, Audrie Gallus, MD  leucovorin (WELLCOVORIN) 5 MG tablet Take 5 mg by mouth See admin instructions. Take 5 mg by mouth once a day on only Sundays and Tuesdays    [provider]  MAGNESIUM GLYCINATE PO Take 1 capsule by mouth at bedtime.    [provider]  metFORMIN (GLUCOPHAGE) 1000 MG tablet TAKE 1 TABLET BY MOUTH TWICE DAILY WITH A MEAL 11/18/22   Tower, Audrie Gallus, MD  methocarbamol (ROBAXIN) 500 MG tablet Take 1 tablet (500 mg total) by mouth every 8 (eight) hours as needed for muscle spasms (back pain). Caution of sedation 07/29/22   Tower, Audrie Gallus, MD  Methotrexate 25 MG/ML SOSY Inject 17.5 mg into the skin every Monday.    [provider]  methotrexate 50 MG/2ML injection SMARTSIG:0.9 Milliliter(s) SUB-Q Once a Week 04/30/22   [provider]  prednisoLONE acetate (PRED FORTE) 1 % ophthalmic suspension Place 4 drops into the left eye 2 (two) times daily.    [provider]  rosuvastatin (CRESTOR) 5 MG tablet Take 1 tablet (5 mg total) by mouth daily.  04/10/22   Tower, Audrie Gallus, MD  Semaglutide, 2 MG/DOSE, 8 MG/3ML SOPN Inject 2 mg as directed once a week. 11/13/22   Tower, Audrie Gallus, MD  traMADol (ULTRAM) 50 MG tablet Take 1 tablet (50 mg total) by mouth every 6 (six) hours as needed for severe pain (for fibromyalgia flares and back pain). 07/29/22   Tower, Audrie Gallus, MD    Family History Family History  Problem Relation Age of Onset   Hypertension Mother    Thyroid disease Mother    Fibromyalgia Mother    Colon polyps Mother    Arthritis Mother    Diabetes Mother    Diabetes Father    Stroke Father    Kidney cancer Maternal Grandmother    Colon cancer Neg Hx    Esophageal cancer Neg Hx    Stomach cancer Neg Hx    Rectal cancer Neg Hx    Allergic rhinitis Neg Hx    Angioedema Neg Hx    Asthma Neg Hx    Atopy Neg Hx    Eczema Neg Hx    Immunodeficiency Neg Hx    Urticaria Neg Hx    Breast cancer Neg Hx     Social History Social History   Tobacco Use   Smoking status: Never   Smokeless tobacco: Never  Vaping Use   Vaping status: Never Used  Substance Use Topics   Alcohol use: Never   Drug use: Never     Allergies   Amlodipine, Cetirizine hcl, Erythromycin, Escitalopram oxalate, Lisinopril, Loratadine, Nifedipine, Sertraline hcl, and Vyvanse [lisdexamfetamine dimesylate]   Review of Systems Review of Systems Per HPI  Physical Exam Triage Vital Signs ED Triage Vitals  Encounter Vitals Group     BP 12/19/22 1126 (!) 171/117     Systolic BP Percentile --      Diastolic BP Percentile --      Pulse Rate 12/19/22 1126 (!) 107     Resp 12/19/22 1126 (!) 22     Temp 12/19/22 1126 97.9 F (36.6 C)     Temp Source 12/19/22 1126 Oral     SpO2 12/19/22 1126 98 %  Weight --      Height --      Head Circumference --      Peak Flow --      Pain Score 12/19/22 1128 3     Pain Loc --      Pain Education --      Exclude from Growth Chart --    No data found.  Updated Vital Signs BP (!) 171/117 (BP Location:  Right Arm)   Pulse (!) 107   Temp 97.9 F (36.6 C) (Oral)   Resp (!) 22   LMP 08/05/2006   SpO2 98%   Visual Acuity Right Eye Distance:   Left Eye Distance:   Bilateral Distance:    Right Eye Near:   Left Eye Near:    Bilateral Near:     Physical Exam Vitals and nursing note reviewed.  Constitutional:      Appearance: Normal appearance.  HENT:     Head: Atraumatic.     Right Ear: Tympanic membrane and external ear normal.     Left Ear: Tympanic membrane and external ear normal.     Nose: Rhinorrhea present.     Mouth/Throat:     Mouth: Mucous membranes are moist.     Pharynx: Posterior oropharyngeal erythema present.  Eyes:     Extraocular Movements: Extraocular movements intact.     Conjunctiva/sclera: Conjunctivae normal.  Cardiovascular:     Rate and Rhythm: Normal rate and regular rhythm.     Heart sounds: Normal heart sounds.  Pulmonary:     Effort: Pulmonary effort is normal.     Breath sounds: Normal breath sounds. No wheezing or rales.  Musculoskeletal:        General: Normal range of motion.     Cervical back: Normal range of motion and neck supple.  Skin:    General: Skin is warm and dry.  Neurological:     Mental Status: She is alert and oriented to person, place, and time.  Psychiatric:        Mood and Affect: Mood normal.        Thought Content: Thought content normal.      UC Treatments / Results  Labs (all labs ordered are listed, but only abnormal results are displayed) Labs Reviewed  POC COVID19/FLU A&B COMBO    EKG   Radiology No results found.  Procedures Procedures (including critical care time)  Medications Ordered in UC Medications  dexamethasone (DECADRON) injection 10 mg (has no administration in time range)    Initial Impression / Assessment and Plan / UC Course  I have reviewed the triage vital signs and the nursing notes.  Pertinent labs & imaging results that were available during my care of the patient were  reviewed by me and considered in my medical decision making (see chart for details).     Hypertensive in triage likely secondary to use of Sudafed.  Discussed better alternatives such as Coricidin HBP, Flonase for her congestion.  IM Decadron given for asthma exacerbation, continue inhaler regimen, Phenergan DM, supportive over-the-counter medications and home care.  Return for worsening symptoms.  COVID and flu negative.  Final Clinical Impressions(s) / UC Diagnoses   Final diagnoses:  Viral URI with cough  Mild intermittent asthma with acute exacerbation     Discharge Instructions      We have given you a steroid shot today and prescribed a good cough syrup.  Continue your inhaler regimen, and you may take medicines such as plain Mucinex,  Coricidin HBP, Flonase nasal spray twice daily, humidifiers, sinus rinses to help additionally with symptoms.    ED Prescriptions     Medication Sig Dispense Auth. Provider   promethazine-dextromethorphan (PROMETHAZINE-DM) 6.25-15 MG/5ML syrup Take 5 mLs by mouth 4 (four) times daily as needed. 100 mL Particia Nearing, [place]      PDMP not reviewed this encounter.   Particia Nearing, [place] 12/19/22 1259

## 2022-12-19 NOTE — Discharge Instructions (Signed)
We have given you a steroid shot today and prescribed a good cough syrup.  Continue your inhaler regimen, and you may take medicines such as plain Mucinex, Coricidin HBP, Flonase nasal spray twice daily, humidifiers, sinus rinses to help additionally with symptoms.

## 2022-12-20 ENCOUNTER — Ambulatory Visit: Payer: Medicare HMO

## 2022-12-24 ENCOUNTER — Other Ambulatory Visit: Payer: Self-pay | Admitting: Family Medicine

## 2023-01-01 DIAGNOSIS — G4733 Obstructive sleep apnea (adult) (pediatric): Secondary | ICD-10-CM | POA: Diagnosis not present

## 2023-01-06 ENCOUNTER — Encounter: Payer: Self-pay | Admitting: Internal Medicine

## 2023-01-06 NOTE — Assessment & Plan Note (Signed)
 Uncomplicated, managed by her allergist

## 2023-01-06 NOTE — Assessment & Plan Note (Signed)
Benefits from CPAP with good compliance and control. ?Plan-replace old machine, auto 5-15 ?

## 2023-01-07 DIAGNOSIS — G4733 Obstructive sleep apnea (adult) (pediatric): Secondary | ICD-10-CM | POA: Diagnosis not present

## 2023-01-11 ENCOUNTER — Encounter: Payer: Self-pay | Admitting: Family Medicine

## 2023-01-11 DIAGNOSIS — Z7985 Long-term (current) use of injectable non-insulin antidiabetic drugs: Secondary | ICD-10-CM

## 2023-01-12 MED ORDER — SEMAGLUTIDE (1 MG/DOSE) 4 MG/3ML ~~LOC~~ SOPN: 1.0000 mg | PEN_INJECTOR | SUBCUTANEOUS | 3 refills | Status: DC

## 2023-01-12 NOTE — Assessment & Plan Note (Signed)
 Did not tolerate semaglutide 2 mg (GI) We dropped back to 1

## 2023-01-20 ENCOUNTER — Encounter: Payer: Self-pay | Admitting: Emergency Medicine

## 2023-01-20 ENCOUNTER — Ambulatory Visit
Admission: EM | Admit: 2023-01-20 | Discharge: 2023-01-20 | Disposition: A | Discharge status: Home / Self Care | Payer: Medicare HMO | Attending: Family Medicine | Admitting: Family Medicine

## 2023-01-20 DIAGNOSIS — N390 Urinary tract infection, site not specified: Secondary | ICD-10-CM

## 2023-01-20 DIAGNOSIS — N898 Other specified noninflammatory disorders of vagina: Secondary | ICD-10-CM | POA: Insufficient documentation

## 2023-01-20 DIAGNOSIS — Z113 Encounter for screening for infections with a predominantly sexual mode of transmission: Secondary | ICD-10-CM | POA: Diagnosis present

## 2023-01-20 LAB — POCT URINALYSIS DIP (MANUAL ENTRY)
Glucose, UA: NEGATIVE mg/dL
Ketones, POC UA: NEGATIVE mg/dL
Nitrite, UA: NEGATIVE
Protein Ur, POC: 100 mg/dL — AB
Spec Grav, UA: >=1.03 — AB (ref 1.010–1.025)
Urobilinogen, UA: 0.2 U/dL
pH, UA: 5.5 (ref 5.0–8.0)

## 2023-01-20 MED ORDER — CEPHALEXIN 500 MG PO CAPS: 500.0000 mg | ORAL_CAPSULE | Freq: Two times a day (BID) | ORAL | 0 refills | Status: DC

## 2023-01-20 MED ORDER — PHENAZOPYRIDINE HCL 100 MG PO TABS: 100.0000 mg | ORAL_TABLET | Freq: Three times a day (TID) | ORAL | 0 refills | Status: DC | PRN

## 2023-01-20 NOTE — ED Triage Notes (Signed)
 Urinary urgency and painful urination since yesterday.  States she is also having some discharge that is dark in color.

## 2023-01-20 NOTE — ED Provider Notes (Signed)
[facility]    CSN: [id number] Arrival date & time: 01/20/23  0802      History   Chief Complaint No chief complaint on file.   HPI [name] is a 58 y.o. female.   Patient presenting today with 1 day history of dysuria, urinary frequency, dark urine.  She is also having some dark vaginal discharge from time to time.  Denies fever, chills, flank pain, abdominal pain, nausea vomiting or diarrhea.  No concern for STIs today.  Status post hysterectomy.    Past Medical History:  Diagnosis Date   ADHD (attention deficit hyperactivity disorder)    ADD - no meds   Allergy    allergic rhinitis   Anxiety    Asthma    Cataract 2015?   Already had them removed   Chronic headaches    Degenerative disc disease    Depression    Diabetes mellitus without complication (HCC)    type 2   Eczema    no current problems as of 07/09/21   Fatty liver    Fibromyalgia    Fx of fibula 2009   GERD (gastroesophageal reflux disease)    Hemorrhoids    Hypertension    Hyperthyroidism    Hypothyroidism    IBS (irritable bowel syndrome)    Iritis, chronic    Macular hole of left eye 06/2017   PONV (postoperative nausea and vomiting)    Right leg DVT (HCC)    Sleep apnea    uses CPAP nightly   Vitamin D insufficiency 06/16/2019    Patient Active Problem List   Diagnosis Date Noted   Hearing loss 11/30/2022   Current use of proton pump inhibitor 09/20/2022   Piriformis muscle pain 07/29/2022   Aortic atherosclerosis (HCC) 03/25/2022   Fractures, stress 12/26/2021   Immunosuppressed status (HCC) 11/19/2021   Encounter for screening mammogram for breast cancer 09/24/2021   Osteoporosis 09/24/2021   Estrogen deficiency 09/01/2021   History of DVT (deep vein thrombosis) 07/11/2021   Sleep apnea 05/14/2021   Hyperlipidemia associated with type 2 diabetes mellitus (HCC) 07/10/2019   Cystoid macular edema of both eyes 06/16/2019   Vitamin D deficiency 06/16/2019    Diabetes mellitus treated with injections of non-insulin medication (HCC) 06/06/2019   Peripheral edema 10/30/2017   Chronic rhinitis 08/10/2017   Welcome to Medicare preventive visit 04/20/2017   Chronic low back pain 12/09/2015   Degeneration of lumbar or lumbosacral intervertebral disc 12/09/2015   Hypothyroid 01/24/2014   Pseudophakia of both eyes 12/15/2013   Nasal septal deviation 01/03/2013   Encounter for Medicare annual wellness exam 11/02/2012   Hydradenitis 11/02/2012   Cystoid macular degeneration of retina 05/13/2012   Macular hole 03/23/2012   Screening-pulmonary TB 03/23/2012   Routine general medical examination at a health care facility 10/02/2011   Iritis, recurrent 10/02/2011   Tachycardia 10/02/2011   ADD (attention deficit disorder) 08/20/2010   DYSHIDROTIC ECZEMA, HANDS 11/12/2009   Obstructive sleep apnea 11/19/2006   Depression with anxiety 09/29/2006   Essential hypertension 09/29/2006   Seasonal and perennial allergic rhinitis 09/29/2006   Asthma, mild intermittent, well-controlled 09/29/2006   GERD 09/29/2006   IBS 09/29/2006   ENDOMETRIOSIS 09/29/2006   FEMALE INFERTILITY 09/29/2006   Fibromyalgia 09/29/2006   INSOMNIA 09/29/2006    Past Surgical History:  Procedure Laterality Date   ABDOMINAL HYSTERECTOMY     ANTERIOR CRUCIATE LIGAMENT REPAIR Left 1996   small tear of ACL   BREAST EXCISIONAL BIOPSY Left 1995  BREAST SURGERY     left nipple removed - benign tumor   BUNIONECTOMY Bilateral    COLONOSCOPY     EYE SURGERY     cataracts removed, macular hole repaired left eye- both surgeries done at [facility]   FRACTURE SURGERY  July 2023   [place] plateau   LAPAROSCOPY  08/2004   endometriosis - several    ORIF TIBIA FRACTURE Right 07/10/2021   Procedure: OPEN REDUCTION INTERNAL FIXATION (ORIF) TIBIAL PLATEAU FRACTURE;  Surgeon: Myrene Galas, MD;  Location: [facility] OR;  Service: Orthopedics;  Laterality: Right;   WISDOM TOOTH EXTRACTION      OB  History   No obstetric history on file.      Home Medications    Prior to Admission medications   Medication Sig Start Date End Date Taking? Authorizing Provider  cephALEXin (KEFLEX) 500 MG capsule Take 1 capsule (500 mg total) by mouth 2 (two) times daily. 01/20/23  Yes Particia Nearing, PA-C  phenazopyridine (PYRIDIUM) 100 MG tablet Take 1 tablet (100 mg total) by mouth 3 (three) times daily as needed for pain. 01/20/23  Yes Particia Nearing, PA-C  acetaminophen (TYLENOL) 500 MG tablet Take 1 tablet (500 mg total) by mouth every 12 (twelve) hours. 07/11/21   Montez Morita, PA-C  ADVIL 200 MG CAPS Take 400 mg by mouth every 6 (six) hours as needed (for mild pain).    [provider]  albuterol (PROVENTIL) (2.5 MG/3ML) 0.083% nebulizer solution Take 3 mLs (2.5 mg total) by nebulization every 4 (four) hours as needed for wheezing or shortness of breath. 02/26/21   Alfonse Spruce, MD  albuterol (VENTOLIN HFA) 108 (90 Base) MCG/ACT inhaler INHALE 2 PUFFS BY MOUTH EVERY 6 HOURS AS NEEDED FOR WHEEZING OR SHORTNESS OF BREATH 12/17/22   Alfonse Spruce, MD  amphetamine-dextroamphetamine (ADDERALL) 20 MG tablet Take 1 tablet (20 mg total) by mouth 2 (two) times daily. 02/13/22   Tower, Audrie Gallus, MD  BD VEO INSULIN SYRINGE U/F 31G X 15/64" 1 ML MISC  05/12/21   [provider]  budesonide-formoterol (SYMBICORT) 160-4.5 MCG/ACT inhaler Inhale 2 puffs into the lungs 2 (two) times daily as needed. 05/27/22   Alfonse Spruce, MD  buPROPion (WELLBUTRIN XL) 300 MG 24 hr tablet Take 1 tablet by mouth once daily 10/26/22   Tower, Audrie Gallus, MD  CALCIUM-VITAMIN D PO Take 1 capsule by mouth in the morning and at bedtime.    [provider]  carvedilol (COREG) 6.25 MG tablet TAKE 1 TABLET BY MOUTH TWICE DAILY WITH A MEAL 11/18/22   Tower, Audrie Gallus, MD  cetirizine (ZYRTEC) 10 MG tablet Take 10 mg by mouth daily as needed for allergies or rhinitis.    [provider]  chlorthalidone (HYGROTON) 25 MG tablet Take 1 tablet by mouth once daily 11/18/22   Tower, [place] A, MD  diphenhydrAMINE (BENADRYL) 25 mg capsule Take 25-50 mg by mouth every 6 (six) hours as needed for allergies.    [provider]  esomeprazole (NEXIUM) 20 MG capsule Take 1 capsule by mouth at bedtime 10/22/22   Tower, Audrie Gallus, MD  folic acid (FOLVITE) 1 MG tablet Take 3 mg by mouth See admin instructions. Take 3 mg by mouth once a day on Wed/Thurs/Fri/Sat    [provider]  glucose blood (ONETOUCH VERIO) test strip USE 1 STRIP TO CHECK GLUCOSE TWICE DAILY AND AS NEEDED FOR DIABETES 12/24/22   Tower, Audrie Gallus, MD  HUMIRA PEN 40  MG/0.8ML PNKT Inject 40 mg into the skin every Monday. 04/30/21   [provider]  ketorolac (ACULAR) 0.5 % ophthalmic solution Place 1 drop into the left eye 2 (two) times daily. 11/24/18   [provider]  Lancets (ONETOUCH DELICA PLUS LANCET33G) MISC USE 1 LANCET TO CHECK GLUCOSE TWICE DAILY 10/26/22   Tower, Audrie Gallus, MD  leucovorin (WELLCOVORIN) 5 MG tablet Take 5 mg by mouth See admin instructions. Take 5 mg by mouth once a day on only Sundays and Tuesdays    [provider]  MAGNESIUM GLYCINATE PO Take 1 capsule by mouth at bedtime.    [provider]  metFORMIN (GLUCOPHAGE) 1000 MG tablet TAKE 1 TABLET BY MOUTH TWICE DAILY WITH A MEAL 11/18/22   Tower, Audrie Gallus, MD  methocarbamol (ROBAXIN) 500 MG tablet Take 1 tablet (500 mg total) by mouth every 8 (eight) hours as needed for muscle spasms (back pain). Caution of sedation 07/29/22   Tower, Audrie Gallus, MD  Methotrexate 25 MG/ML SOSY Inject 17.5 mg into the skin every Monday.    [provider]  methotrexate 50 MG/2ML injection SMARTSIG:0.9 Milliliter(s) SUB-Q Once a Week 04/30/22   [provider]  prednisoLONE acetate (PRED FORTE) 1 % ophthalmic suspension Place 4 drops into the left eye 2 (two) times daily.    [provider]   promethazine-dextromethorphan (PROMETHAZINE-DM) 6.25-15 MG/5ML syrup Take 5 mLs by mouth 4 (four) times daily as needed. 12/19/22   Particia Nearing, PA-C  rosuvastatin (CRESTOR) 5 MG tablet Take 1 tablet (5 mg total) by mouth daily. 04/10/22   Tower, Audrie Gallus, MD  Semaglutide, 1 MG/DOSE, 4 MG/3ML SOPN Inject 1 mg as directed once a week. 01/12/23   Tower, Audrie Gallus, MD  traMADol (ULTRAM) 50 MG tablet Take 1 tablet (50 mg total) by mouth every 6 (six) hours as needed for severe pain (for fibromyalgia flares and back pain). 07/29/22   Tower, Audrie Gallus, MD    Family History Family History  Problem Relation Age of Onset   Hypertension Mother    Thyroid disease Mother    Fibromyalgia Mother    Colon polyps Mother    Arthritis Mother    Diabetes Mother    Diabetes Father    Stroke Father    Kidney cancer Maternal Grandmother    Colon cancer Neg Hx    Esophageal cancer Neg Hx    Stomach cancer Neg Hx    Rectal cancer Neg Hx    Allergic rhinitis Neg Hx    Angioedema Neg Hx    Asthma Neg Hx    Atopy Neg Hx    Eczema Neg Hx    Immunodeficiency Neg Hx    Urticaria Neg Hx    Breast cancer Neg Hx     Social History Social History   Tobacco Use   Smoking status: Never   Smokeless tobacco: Never  Vaping Use   Vaping status: Never Used  Substance Use Topics   Alcohol use: Never   Drug use: Never     Allergies   Amlodipine, Cetirizine hcl, Erythromycin, Escitalopram oxalate, Lisinopril, Loratadine, Nifedipine, Sertraline hcl, and Vyvanse [lisdexamfetamine dimesylate]   Review of Systems Review of Systems PER HPI  Physical Exam Triage Vital Signs ED Triage Vitals  Encounter Vitals Group     BP 01/20/23 0814 (!) 161/96     Systolic BP Percentile --      Diastolic BP Percentile --      Pulse Rate 01/20/23  0814 96     Resp 01/20/23 0814 18     Temp 01/20/23 0814 98 F (36.7 C)     Temp Source 01/20/23 0814 Oral     SpO2 01/20/23 0814 98 %     Weight --      Height --       Head Circumference --      Peak Flow --      Pain Score 01/20/23 0815 0     Pain Loc --      Pain Education --      Exclude from Growth Chart --    No data found.  Updated Vital Signs BP (!) 161/96 (BP Location: Right Arm)   Pulse 96   Temp 98 F (36.7 C) (Oral)   Resp 18   LMP 08/05/2006   SpO2 98%   Visual Acuity Right Eye Distance:   Left Eye Distance:   Bilateral Distance:    Right Eye Near:   Left Eye Near:    Bilateral Near:     Physical Exam Vitals and nursing note reviewed.  Constitutional:      Appearance: Normal appearance. She is not ill-appearing.  HENT:     Head: Atraumatic.     Mouth/Throat:     Mouth: Mucous membranes are moist.  Eyes:     Extraocular Movements: Extraocular movements intact.     Conjunctiva/sclera: Conjunctivae normal.  Cardiovascular:     Rate and Rhythm: Normal rate and regular rhythm.     Heart sounds: Normal heart sounds.  Pulmonary:     Effort: Pulmonary effort is normal.     Breath sounds: Normal breath sounds.  Abdominal:     General: Bowel sounds are normal. There is no distension.     Palpations: Abdomen is soft.     Tenderness: There is no abdominal tenderness. There is no right CVA tenderness, left CVA tenderness or guarding.  Musculoskeletal:        General: Normal range of motion.     Cervical back: Normal range of motion and neck supple.  Skin:    General: Skin is warm and dry.  Neurological:     Mental Status: She is alert and oriented to person, place, and time.  Psychiatric:        Mood and Affect: Mood normal.        Thought Content: Thought content normal.        Judgment: Judgment normal.      UC Treatments / Results  Labs (all labs ordered are listed, but only abnormal results are displayed) Labs Reviewed  POCT URINALYSIS DIP (MANUAL ENTRY) - Abnormal; Notable for the following components:      Result Value   Bilirubin, UA small (*)    Spec Grav, UA >=1.030 (*)    Blood, UA trace-intact (*)     Protein Ur, POC =100 (*)    Leukocytes, UA Small (1+) (*)    All other components within normal limits  URINE CULTURE  CERVICOVAGINAL ANCILLARY ONLY    EKG   Radiology No results found.  Procedures Procedures (including critical care time)  Medications Ordered in UC Medications - No data to display  Initial Impression / Assessment and Plan / UC Course  I have reviewed the triage vital signs and the nursing notes.  Pertinent labs & imaging results that were available during my care of the patient were reviewed by me and considered in my medical decision making (see chart for details).  Exam very reassuring today, urinalysis with evidence of a possible urinary tract infection.  Will treat.  Clean with Keflex, Pyridium and await urine culture.  Adjust if needed.  She is requesting a vaginal swab today as she is having some mild discharge additionally.  Treat based on results for this as well.  Final Clinical Impressions(s) / UC Diagnoses   Final diagnoses:  Acute lower UTI   Discharge Instructions   None    ED Prescriptions     Medication Sig Dispense Auth. Provider   cephALEXin (KEFLEX) 500 MG capsule Take 1 capsule (500 mg total) by mouth 2 (two) times daily. 10 capsule Particia Nearing, [place]   phenazopyridine (PYRIDIUM) 100 MG tablet Take 1 tablet (100 mg total) by mouth 3 (three) times daily as needed for pain. 10 tablet Particia Nearing, [place]      PDMP not reviewed this encounter.   Particia Nearing, [place] 01/21/23 1230

## 2023-01-21 LAB — URINE CULTURE: Culture: <10000 — AB

## 2023-01-21 LAB — CERVICOVAGINAL ANCILLARY ONLY
Bacterial Vaginitis (gardnerella): NEGATIVE
Candida Glabrata: NEGATIVE
Candida Vaginitis: NEGATIVE
Comment: NEGATIVE
Comment: NEGATIVE
Comment: NEGATIVE

## 2023-01-29 ENCOUNTER — Telehealth (INDEPENDENT_AMBULATORY_CARE_PROVIDER_SITE_OTHER): Payer: Self-pay | Admitting: Otolaryngology

## 2023-01-29 NOTE — Telephone Encounter (Signed)
LVM to confirm appt & location [id number] afm

## 2023-02-01 ENCOUNTER — Ambulatory Visit (INDEPENDENT_AMBULATORY_CARE_PROVIDER_SITE_OTHER): Payer: Medicare HMO | Admitting: Audiology

## 2023-02-01 ENCOUNTER — Ambulatory Visit (INDEPENDENT_AMBULATORY_CARE_PROVIDER_SITE_OTHER): Payer: Medicare HMO | Admitting: Otolaryngology

## 2023-02-01 VITALS — BP 153/89 | HR 89 | Ht 68.0 in | Wt 177.0 lb

## 2023-02-01 DIAGNOSIS — H90A32 Mixed conductive and sensorineural hearing loss, unilateral, left ear with restricted hearing on the contralateral side: Secondary | ICD-10-CM | POA: Diagnosis not present

## 2023-02-01 DIAGNOSIS — H90A21 Sensorineural hearing loss, unilateral, right ear, with restricted hearing on the contralateral side: Secondary | ICD-10-CM | POA: Diagnosis not present

## 2023-02-01 DIAGNOSIS — J342 Deviated nasal septum: Secondary | ICD-10-CM | POA: Diagnosis not present

## 2023-02-01 DIAGNOSIS — H9012 Conductive hearing loss, unilateral, left ear, with unrestricted hearing on the contralateral side: Secondary | ICD-10-CM | POA: Diagnosis not present

## 2023-02-01 DIAGNOSIS — H6982 Other specified disorders of Eustachian tube, left ear: Secondary | ICD-10-CM

## 2023-02-01 DIAGNOSIS — R0981 Nasal congestion: Secondary | ICD-10-CM | POA: Diagnosis not present

## 2023-02-01 DIAGNOSIS — J343 Hypertrophy of nasal turbinates: Secondary | ICD-10-CM

## 2023-02-01 DIAGNOSIS — J310 Chronic rhinitis: Secondary | ICD-10-CM

## 2023-02-01 DIAGNOSIS — H6522 Chronic serous otitis media, left ear: Secondary | ICD-10-CM

## 2023-02-01 NOTE — Progress Notes (Signed)
,    [address] [place], [place] [address] [phone]  Audiological Evaluation    Name: [name]     DOB:   1965-12-26      MRN:   [id number]                                                                                     Service Date: 02/01/2023     Accompanied by: unaccompanied    Patient comes today after Dr. Suszanne Conners, ENT sent a referral for a hearing evaluation due to concerns with hearing loss.   Symptoms Yes Details  Hearing loss  [x]  Left sided hearing loss. Reports she had a hearing test in a booth several weeks ago She did not have the test with her and we weren't able to find it attached to the referral.  Tinnitus  []    Ear pain/ Ear infections  [x]  Audiologist who saw her suspected she had fluid in her left ear.  Balance problems  []    Noise exposure  []    Previous ear surgeries  []    Family history  []    Amplification  [x]  Patient has a set of Phonak hearing aids.   Other  []      Otoscopy: Right ear: Clear external ear canals and notable landmarks visualized on the tympanic membrane. Left ear:  Clear external ear canals and notable landmarks visualized on the tympanic membrane.  Tympanometry: Right ear: Type A- Normal external ear canal volume with normal middle ear pressure and tympanic membrane compliance. Left ear: Type B- Normal external ear canal volume with no middle ear pressure and tympanic membrane compliance.  Pure tone Audiometry: Right ear- Normal hearing from [phone], then mild sensorineural hearing loss from 4000 Hz - 8000 Hz. Left ear-  Mild to moderate mixed hearing loss from 250 Hz - 8000 Hz.  The hearing test results were completed under headphones and results are deemed to be of good reliability. Test technique:  conventional     Speech Audiometry: Right ear- Speech Reception Threshold (SRT) was obtained at 15 dBHL Left ear-Speech Reception Threshold (SRT) was obtained at 50 dBHL   Word Recognition Score Tested  using NU-6 (MLV) Right ear: 92% was obtained at a presentation level of 55 dBHL with contralateral masking which is deemed as  excellent Left ear: 100% was obtained at a presentation level of 80 dBHL with contralateral masking which is deemed as  excellent     Recommendations: Follow up with ENT as scheduled for today. Repeat audiogram after medical care.   [name], AUD

## 2023-02-02 DIAGNOSIS — H6522 Chronic serous otitis media, left ear: Secondary | ICD-10-CM | POA: Insufficient documentation

## 2023-02-02 DIAGNOSIS — H9012 Conductive hearing loss, unilateral, left ear, with unrestricted hearing on the contralateral side: Secondary | ICD-10-CM | POA: Insufficient documentation

## 2023-02-02 DIAGNOSIS — J343 Hypertrophy of nasal turbinates: Secondary | ICD-10-CM | POA: Insufficient documentation

## 2023-02-02 DIAGNOSIS — H6982 Other specified disorders of Eustachian tube, left ear: Secondary | ICD-10-CM | POA: Insufficient documentation

## 2023-02-02 NOTE — Progress Notes (Signed)
Patient ID: [name], female   DOB: 10/30/1965, 58 y.o.   MRN: [id number]  CC: Left ear hearing loss, clogging sensation in the left ear  HPI:  [name] is a 58 y.o. female who presents today complaining of left ear hearing loss for the past 6 months.  She has also noted a constant clogging sensation in the left ear.  The patient denies any otalgia or otorrhea.  She has a history of environmental allergies.  She currently uses Zyrtec and Flonase nasal spray as needed.  The patient has no previous ENT surgery.  She has no recent known otitis media or otitis externa.  She also denies any recent sinusitis.  Past Medical History:  Diagnosis Date   ADHD (attention deficit hyperactivity disorder)    ADD - no meds   Allergy    allergic rhinitis   Anxiety    Asthma    Cataract 2015?   Already had them removed   Chronic headaches    Degenerative disc disease    Depression    Diabetes mellitus without complication (HCC)    type 2   Eczema    no current problems as of 07/09/21   Fatty liver    Fibromyalgia    Fx of fibula 2009   GERD (gastroesophageal reflux disease)    Hemorrhoids    Hypertension    Hyperthyroidism    Hypothyroidism    IBS (irritable bowel syndrome)    Iritis, chronic    Macular hole of left eye 06/2017   PONV (postoperative nausea and vomiting)    Right leg DVT (HCC)    Sleep apnea    uses CPAP nightly   Vitamin D insufficiency 06/16/2019    Past Surgical History:  Procedure Laterality Date   ABDOMINAL HYSTERECTOMY     ANTERIOR CRUCIATE LIGAMENT REPAIR Left 1996   small tear of ACL   BREAST EXCISIONAL BIOPSY Left 1995   BREAST SURGERY     left nipple removed - benign tumor   BUNIONECTOMY Bilateral    COLONOSCOPY     EYE SURGERY     cataracts removed, macular hole repaired left eye- both surgeries done at [facility]   FRACTURE SURGERY  July 2023   [place]ibia plateau   LAPAROSCOPY  08/2004   endometriosis - several    ORIF TIBIA FRACTURE Right 07/10/2021    Procedure: OPEN REDUCTION INTERNAL FIXATION (ORIF) TIBIAL PLATEAU FRACTURE;  Surgeon: Myrene Galas, MD;  Location: [facility] OR;  Service: Orthopedics;  Laterality: Right;   WISDOM TOOTH EXTRACTION      Family History  Problem Relation Age of Onset   Hypertension Mother    Thyroid disease Mother    Fibromyalgia Mother    Colon polyps Mother    Arthritis Mother    Diabetes Mother    Diabetes Father    Stroke Father    Kidney cancer Maternal Grandmother    Colon cancer Neg Hx    Esophageal cancer Neg Hx    Stomach cancer Neg Hx    Rectal cancer Neg Hx    Allergic rhinitis Neg Hx    Angioedema Neg Hx    Asthma Neg Hx    Atopy Neg Hx    Eczema Neg Hx    Immunodeficiency Neg Hx    Urticaria Neg Hx    Breast cancer Neg Hx     Social History:  reports that she has never smoked. She has never used smokeless tobacco. She reports that she does not  drink alcohol and does not use drugs.  Allergies:  Allergies  Allergen Reactions   Amlodipine     Pedal edema    Cetirizine Hcl Other (See Comments)    Not effective   Erythromycin Nausea Only and Other (See Comments)    Caused a stomach ache   Escitalopram Oxalate Other (See Comments)    "No improvement"   Lisinopril Cough   Loratadine Other (See Comments)    "Not effective"   Nifedipine Other (See Comments)    "Fatigue and tremors"   Sertraline Hcl Other (See Comments)    "Did not help"   Vyvanse [Lisdexamfetamine Dimesylate]     Headache and excitablility    Prior to Admission medications   Medication Sig Start Date End Date Taking? Authorizing Provider  BD VEO INSULIN SYRINGE U/F 31G X 15/64" 1 ML MISC  05/12/21  Yes [provider]  budesonide-formoterol (SYMBICORT) 160-4.5 MCG/ACT inhaler Inhale 2 puffs into the lungs 2 (two) times daily as needed. 05/27/22  Yes Alfonse Spruce, MD  buPROPion (WELLBUTRIN XL) 300 MG 24 hr tablet Take 1 tablet by mouth once daily 10/26/22  Yes Tower, Audrie Gallus, MD  CALCIUM-VITAMIN  D PO Take 1 capsule by mouth in the morning and at bedtime.   Yes [provider]  carvedilol (COREG) 6.25 MG tablet TAKE 1 TABLET BY MOUTH TWICE DAILY WITH A MEAL 11/18/22  Yes Tower, Audrie Gallus, MD  chlorthalidone (HYGROTON) 25 MG tablet Take 1 tablet by mouth once daily 11/18/22  Yes Tower, Audrie Gallus, MD  esomeprazole (NEXIUM) 20 MG capsule Take 1 capsule by mouth at bedtime 10/22/22  Yes Tower, Audrie Gallus, MD  folic acid (FOLVITE) 1 MG tablet Take 3 mg by mouth See admin instructions. Take 3 mg by mouth once a day on Wed/Thurs/Fri/Sat   Yes [provider]  glucose blood (ONETOUCH VERIO) test strip USE 1 STRIP TO CHECK GLUCOSE TWICE DAILY AND AS NEEDED FOR DIABETES 12/24/22  Yes Tower, Audrie Gallus, MD  HUMIRA PEN 40 MG/0.8ML PNKT Inject 40 mg into the skin every Monday. 04/30/21  Yes [provider]  ketorolac (ACULAR) 0.5 % ophthalmic solution Place 1 drop into the left eye 2 (two) times daily. 11/24/18  Yes [provider]  Lancets (ONETOUCH DELICA PLUS LANCET33G) MISC USE 1 LANCET TO CHECK GLUCOSE TWICE DAILY 10/26/22  Yes Tower, Audrie Gallus, MD  leucovorin (WELLCOVORIN) 5 MG tablet Take 5 mg by mouth See admin instructions. Take 5 mg by mouth once a day on only Sundays and Tuesdays   Yes [provider]  MAGNESIUM GLYCINATE PO Take 1 capsule by mouth at bedtime.   Yes [provider]  metFORMIN (GLUCOPHAGE) 1000 MG tablet TAKE 1 TABLET BY MOUTH TWICE DAILY WITH A MEAL 11/18/22  Yes Tower, Audrie Gallus, MD  methocarbamol (ROBAXIN) 500 MG tablet Take 1 tablet (500 mg total) by mouth every 8 (eight) hours as needed for muscle spasms (back pain). Caution of sedation 07/29/22  Yes Tower, Audrie Gallus, MD  Methotrexate 25 MG/ML SOSY Inject 17.5 mg into the skin every Monday.   Yes [provider]  methotrexate 50 MG/2ML injection SMARTSIG:0.9 Milliliter(s) SUB-Q Once a Week 04/30/22  Yes [provider]  prednisoLONE acetate (PRED FORTE) 1 % ophthalmic  suspension Place 4 drops into the left eye 2 (two) times daily.   Yes [provider]  rosuvastatin (CRESTOR) 5 MG tablet Take 1 tablet (5 mg total) by mouth daily. 04/10/22  Yes Tower,  Audrie Gallus, MD  Semaglutide, 1 MG/DOSE, 4 MG/3ML SOPN Inject 1 mg as directed once a week. 01/12/23  Yes Tower, Audrie Gallus, MD  acetaminophen (TYLENOL) 500 MG tablet Take 1 tablet (500 mg total) by mouth every 12 (twelve) hours. Patient not taking: Reported on 02/01/2023 07/11/21   Montez Morita, PA-C  ADVIL 200 MG CAPS Take 400 mg by mouth every 6 (six) hours as needed (for mild pain). Patient not taking: Reported on 02/01/2023    [provider]  albuterol (PROVENTIL) (2.5 MG/3ML) 0.083% nebulizer solution Take 3 mLs (2.5 mg total) by nebulization every 4 (four) hours as needed for wheezing or shortness of breath. Patient not taking: Reported on 02/01/2023 02/26/21   Alfonse Spruce, MD  albuterol (VENTOLIN HFA) 108 (90 Base) MCG/ACT inhaler INHALE 2 PUFFS BY MOUTH EVERY 6 HOURS AS NEEDED FOR WHEEZING OR SHORTNESS OF BREATH Patient not taking: Reported on 02/01/2023 12/17/22   Alfonse Spruce, MD  amphetamine-dextroamphetamine (ADDERALL) 20 MG tablet Take 1 tablet (20 mg total) by mouth 2 (two) times daily. Patient not taking: Reported on 02/01/2023 02/13/22   Tower, Audrie Gallus, MD  cephALEXin (KEFLEX) 500 MG capsule Take 1 capsule (500 mg total) by mouth 2 (two) times daily. 01/20/23   Particia Nearing, PA-C  cetirizine (ZYRTEC) 10 MG tablet Take 10 mg by mouth daily as needed for allergies or rhinitis. Patient not taking: Reported on 02/01/2023    [provider]  diphenhydrAMINE (BENADRYL) 25 mg capsule Take 25-50 mg by mouth every 6 (six) hours as needed for allergies. Patient not taking: Reported on 02/01/2023    [provider]  phenazopyridine (PYRIDIUM) 100 MG tablet Take 1 tablet (100 mg total) by mouth 3 (three) times daily as needed for pain. 01/20/23   Particia Nearing,  PA-C  promethazine-dextromethorphan (PROMETHAZINE-DM) 6.25-15 MG/5ML syrup Take 5 mLs by mouth 4 (four) times daily as needed. 12/19/22   Particia Nearing, PA-C  traMADol (ULTRAM) 50 MG tablet Take 1 tablet (50 mg total) by mouth every 6 (six) hours as needed for severe pain (for fibromyalgia flares and back pain). Patient not taking: Reported on 02/01/2023 07/29/22   Tower, Audrie Gallus, MD    Blood pressure (!) 153/89, pulse 89, height 5\' 8"  (1.727 m), weight 177 lb (80.3 kg), last menstrual period 08/05/2006, SpO2 97%. Exam: General: Communicates without difficulty, well nourished, no acute distress. Head: Normocephalic, no evidence injury, no tenderness, facial buttresses intact without stepoff. Face/sinus: No tenderness to palpation and percussion. Facial movement is normal and symmetric. Eyes: PERRL, EOMI. No scleral icterus, conjunctivae clear. Neuro: CN II exam reveals vision grossly intact.  No nystagmus at any point of gaze. Ears: Auricles well formed without lesions.  Ear canals are intact without mass or lesion.  No erythema or edema is appreciated.  The TMs are intact with left middle ear effusion. Nose: External evaluation reveals normal support and skin without lesions.  Dorsum is intact.  Anterior rhinoscopy reveals congested mucosa over anterior aspect of inferior turbinates and intact septum.  No purulence noted. Oral:  Oral cavity and oropharynx are intact, symmetric, without erythema or edema.  Mucosa is moist without lesions. Neck: Full range of motion without pain.  There is no significant lymphadenopathy.  No masses palpable.  Thyroid bed within normal limits to palpation.  Parotid glands and submandibular glands equal bilaterally without mass.  Trachea is midline. Neuro:  CN 2-12 grossly intact.   Procedure:  Flexible Nasal Endoscopy: Description: Risks,  benefits, and alternatives of flexible endoscopy were explained to the patient.  Specific mention was made of the risk of throat  numbness with difficulty swallowing, possible bleeding from the nose and mouth, and pain from the procedure.  The patient gave oral consent to proceed.  The flexible scope was inserted into the right nasal cavity.  Endoscopy of the interior nasal cavity, superior, inferior, and middle meatus was performed. The sphenoid-ethmoid recess was examined. Edematous mucosa was noted.  No polyp, mass, or lesion was appreciated. Nasal septal deviation noted. Olfactory cleft was clear.  Nasopharynx was clear.  Turbinates were hypertrophied but without mass.  The procedure was repeated on the contralateral side with similar findings.  The patient tolerated the procedure well.   Her hearing test shows asymmetric left ear conductive hearing loss.  Assessment: 1.  Left middle ear effusion, resulting in left ear conductive hearing loss. 2.  Left ear eustachian tube dysfunction. 3.  Chronic rhinitis with nasal mucosal congestion, nasal septal deviation, and bilateral inferior turbinate hypertrophy.  No nasopharyngeal mass or purulent drainage is noted today.  Plan: 1.  The physical exam and nasal endoscopy findings are reviewed with the patient. 2.  The patient is reassured and no acute infection is noted today. 3.  Flonase nasal spray 2 sprays each nostril daily.  The importance of consistent daily use is discussed. 4.  Valsalva exercise multiple times a day. 5.  The patient will return for reevaluation in 6 weeks.  If she continues to be symptomatic, she may benefit from left myringotomy and tube placement.  Grayson White W Khadeeja Elden 02/02/2023, 11:38 AM

## 2023-02-07 DIAGNOSIS — G4733 Obstructive sleep apnea (adult) (pediatric): Secondary | ICD-10-CM | POA: Diagnosis not present

## 2023-02-21 ENCOUNTER — Other Ambulatory Visit: Payer: Self-pay | Admitting: Family Medicine

## 2023-02-26 ENCOUNTER — Other Ambulatory Visit: Payer: Self-pay | Admitting: *Deleted

## 2023-02-26 MED ORDER — ROSUVASTATIN CALCIUM 5 MG PO TABS: 5.0000 mg | ORAL_TABLET | Freq: Every day | ORAL | 0 refills | Status: DC

## 2023-03-07 DIAGNOSIS — G4733 Obstructive sleep apnea (adult) (pediatric): Secondary | ICD-10-CM | POA: Diagnosis not present

## 2023-03-16 ENCOUNTER — Encounter (INDEPENDENT_AMBULATORY_CARE_PROVIDER_SITE_OTHER): Payer: Self-pay

## 2023-03-16 ENCOUNTER — Ambulatory Visit (INDEPENDENT_AMBULATORY_CARE_PROVIDER_SITE_OTHER): Payer: Medicare HMO | Admitting: Otolaryngology

## 2023-03-16 VITALS — BP 142/86 | HR 94 | Ht 67.0 in | Wt 175.0 lb

## 2023-03-16 DIAGNOSIS — J310 Chronic rhinitis: Secondary | ICD-10-CM | POA: Diagnosis not present

## 2023-03-16 DIAGNOSIS — R0981 Nasal congestion: Secondary | ICD-10-CM | POA: Diagnosis not present

## 2023-03-16 DIAGNOSIS — H6982 Other specified disorders of Eustachian tube, left ear: Secondary | ICD-10-CM | POA: Diagnosis not present

## 2023-03-16 DIAGNOSIS — R058 Other specified cough: Secondary | ICD-10-CM

## 2023-03-16 DIAGNOSIS — J343 Hypertrophy of nasal turbinates: Secondary | ICD-10-CM

## 2023-03-16 DIAGNOSIS — J342 Deviated nasal septum: Secondary | ICD-10-CM

## 2023-03-16 DIAGNOSIS — H6522 Chronic serous otitis media, left ear: Secondary | ICD-10-CM

## 2023-03-16 DIAGNOSIS — H9012 Conductive hearing loss, unilateral, left ear, with unrestricted hearing on the contralateral side: Secondary | ICD-10-CM | POA: Diagnosis not present

## 2023-03-17 NOTE — Progress Notes (Signed)
 Patient ID: [name], female   DOB: December 26, 1965, 58 y.o.   MRN: [id number]  Follow-up: Left middle ear effusion, left ear eustachian tube dysfunction, left ear conductive hearing loss  HPI: The patient is a 58 year old female who returns today for her follow-up evaluation.  The patient was last seen 6 weeks ago.  At that time, she was complaining of clogging sensation in her left ear and left ear hearing loss.  She was noted to have left middle ear effusion and left ear eustachian tube dysfunction.  Significant left ear conductive hearing loss was noted.  The patient was treated with Flonase nasal spray and Valsalva exercise.  In addition, she was also noted to have nasal mucosal congestion, nasal septal deviation, and bilateral inferior turbinate hypertrophy.  The patient returns today complaining of persistent clogging sensation and muffled hearing on the left side.  She denies any otalgia, otorrhea, or vertigo.  Exam: General: Communicates without difficulty, well nourished, no acute distress. Head: Normocephalic, no evidence injury, no tenderness, facial buttresses intact without stepoff. Face/sinus: No tenderness to palpation and percussion. Facial movement is normal and symmetric. Eyes: PERRL, EOMI. No scleral icterus, conjunctivae clear. Neuro: CN II exam reveals vision grossly intact.  No nystagmus at any point of gaze. Ears: Auricles well formed without lesions.  Ear canals are intact without mass or lesion.  No erythema or edema is appreciated.  The TMs are intact without fluid. Nose: External evaluation reveals normal support and skin without lesions.  Dorsum is intact.  Anterior rhinoscopy reveals congested mucosa over anterior aspect of inferior turbinates and deviated septum.  No purulence noted. Oral:  Oral cavity and oropharynx are intact, symmetric, without erythema or edema.  Mucosa is moist without lesions. Neck: Full range of motion without pain.  There is no significant  lymphadenopathy.  No masses palpable.  Thyroid bed within normal limits to palpation.  Parotid glands and submandibular glands equal bilaterally without mass.  Trachea is midline. Neuro:  CN 2-12 grossly intact.    Assessment: 1.  The patient's left middle ear effusion has clinically improved.  No significant fluid is noted today. 2.  Persistent eustachian tube dysfunction, with clogging sensation in the left ear and left ear conductive hearing loss. 3.  Chronic rhinitis with nasal mucosal congestion, nasal septal deviation, and bilateral inferior turbinate hypertrophy.  Plan: 1.  The physical exam findings are reviewed with the patient. 2.  The options of continuing medical treatment versus surgical intervention with left myringotomy and tube placement are discussed with the patient. 3.  The patient will like to continue with medical treatment.  She will continue with Flonase nasal spray 2 sprays each nostril daily. 4.  The patient will return for reevaluation in 6 weeks.

## 2023-03-19 DIAGNOSIS — Z79899 Other long term (current) drug therapy: Secondary | ICD-10-CM | POA: Diagnosis not present

## 2023-03-19 DIAGNOSIS — H35342 Macular cyst, hole, or pseudohole, left eye: Secondary | ICD-10-CM | POA: Diagnosis not present

## 2023-03-19 DIAGNOSIS — H3023 Posterior cyclitis, bilateral: Secondary | ICD-10-CM | POA: Diagnosis not present

## 2023-03-19 DIAGNOSIS — H35353 Cystoid macular degeneration, bilateral: Secondary | ICD-10-CM | POA: Diagnosis not present

## 2023-03-23 ENCOUNTER — Encounter (HOSPITAL_COMMUNITY): Payer: Self-pay | Admitting: *Deleted

## 2023-03-23 ENCOUNTER — Other Ambulatory Visit (HOSPITAL_COMMUNITY): Payer: Self-pay

## 2023-03-23 ENCOUNTER — Emergency Department (HOSPITAL_COMMUNITY)

## 2023-03-23 ENCOUNTER — Telehealth (HOSPITAL_COMMUNITY): Payer: Self-pay | Admitting: Pharmacy Technician

## 2023-03-23 ENCOUNTER — Other Ambulatory Visit: Payer: Self-pay

## 2023-03-23 ENCOUNTER — Observation Stay (HOSPITAL_COMMUNITY)
Admission: EM | Admit: 2023-03-23 | Discharge: 2023-03-24 | Disposition: A | Discharge status: Home / Self Care | Attending: Internal Medicine | Admitting: Internal Medicine

## 2023-03-23 ENCOUNTER — Ambulatory Visit: Payer: Self-pay | Admitting: Family Medicine

## 2023-03-23 DIAGNOSIS — E119 Type 2 diabetes mellitus without complications: Secondary | ICD-10-CM | POA: Diagnosis not present

## 2023-03-23 DIAGNOSIS — Z1152 Encounter for screening for COVID-19: Secondary | ICD-10-CM | POA: Insufficient documentation

## 2023-03-23 DIAGNOSIS — M797 Fibromyalgia: Secondary | ICD-10-CM | POA: Insufficient documentation

## 2023-03-23 DIAGNOSIS — Z794 Long term (current) use of insulin: Secondary | ICD-10-CM | POA: Diagnosis not present

## 2023-03-23 DIAGNOSIS — R112 Nausea with vomiting, unspecified: Secondary | ICD-10-CM

## 2023-03-23 DIAGNOSIS — Z86718 Personal history of other venous thrombosis and embolism: Secondary | ICD-10-CM | POA: Insufficient documentation

## 2023-03-23 DIAGNOSIS — R002 Palpitations: Secondary | ICD-10-CM | POA: Diagnosis not present

## 2023-03-23 DIAGNOSIS — E039 Hypothyroidism, unspecified: Secondary | ICD-10-CM | POA: Diagnosis not present

## 2023-03-23 DIAGNOSIS — R000 Tachycardia, unspecified: Secondary | ICD-10-CM | POA: Diagnosis present

## 2023-03-23 DIAGNOSIS — I4891 Unspecified atrial fibrillation: Secondary | ICD-10-CM | POA: Diagnosis not present

## 2023-03-23 DIAGNOSIS — R197 Diarrhea, unspecified: Secondary | ICD-10-CM

## 2023-03-23 DIAGNOSIS — Z7985 Long-term (current) use of injectable non-insulin antidiabetic drugs: Secondary | ICD-10-CM | POA: Diagnosis not present

## 2023-03-23 DIAGNOSIS — E785 Hyperlipidemia, unspecified: Secondary | ICD-10-CM | POA: Diagnosis not present

## 2023-03-23 DIAGNOSIS — G4733 Obstructive sleep apnea (adult) (pediatric): Secondary | ICD-10-CM | POA: Insufficient documentation

## 2023-03-23 DIAGNOSIS — J449 Chronic obstructive pulmonary disease, unspecified: Secondary | ICD-10-CM | POA: Insufficient documentation

## 2023-03-23 DIAGNOSIS — Z7984 Long term (current) use of oral hypoglycemic drugs: Secondary | ICD-10-CM | POA: Diagnosis not present

## 2023-03-23 DIAGNOSIS — K219 Gastro-esophageal reflux disease without esophagitis: Secondary | ICD-10-CM | POA: Diagnosis not present

## 2023-03-23 DIAGNOSIS — I10 Essential (primary) hypertension: Secondary | ICD-10-CM | POA: Insufficient documentation

## 2023-03-23 DIAGNOSIS — Z79899 Other long term (current) drug therapy: Secondary | ICD-10-CM | POA: Insufficient documentation

## 2023-03-23 DIAGNOSIS — F341 Dysthymic disorder: Secondary | ICD-10-CM | POA: Diagnosis not present

## 2023-03-23 LAB — HEMOGLOBIN A1C
Hgb A1c MFr Bld: 7 % — ABNORMAL HIGH (ref 4.8–5.6)
Mean Plasma Glucose: 154.2 mg/dL

## 2023-03-23 LAB — GLUCOSE, CAPILLARY
Glucose-Capillary: 104 mg/dL — ABNORMAL HIGH (ref 70–99)
Glucose-Capillary: 101 mg/dL — ABNORMAL HIGH (ref 70–99)
Glucose-Capillary: 131 mg/dL — ABNORMAL HIGH (ref 70–99)

## 2023-03-23 LAB — RESP PANEL BY RT-PCR (RSV, FLU A&B, COVID)  RVPGX2
Influenza A by PCR: NEGATIVE
Influenza B by PCR: NEGATIVE
Resp Syncytial Virus by PCR: NEGATIVE
SARS Coronavirus 2 by RT PCR: NEGATIVE

## 2023-03-23 LAB — CBC
HCT: 48.4 % — ABNORMAL HIGH (ref 36.0–46.0)
Hemoglobin: 15.8 g/dL — ABNORMAL HIGH (ref 12.0–15.0)
MCH: 28.6 pg (ref 26.0–34.0)
MCHC: 32.6 g/dL (ref 30.0–36.0)
MCV: 87.7 fL (ref 80.0–100.0)
Platelets: 370 10*3/uL (ref 150–400)
RBC: 5.52 MIL/uL — ABNORMAL HIGH (ref 3.87–5.11)
RDW: 14.2 % (ref 11.5–15.5)
WBC: 12.7 10*3/uL — ABNORMAL HIGH (ref 4.0–10.5)
nRBC: 0 % (ref 0.0–0.2)

## 2023-03-23 LAB — BASIC METABOLIC PANEL
Anion gap: 15 (ref 5–15)
BUN: 12 mg/dL (ref 6–20)
CO2: 22 mmol/L (ref 22–32)
Calcium: 9.8 mg/dL (ref 8.9–10.3)
Chloride: 103 mmol/L (ref 98–111)
Creatinine, Ser: 0.85 mg/dL (ref 0.44–1.00)
GFR, Estimated: >60 mL/min (ref >60)
Glucose, Bld: 148 mg/dL — ABNORMAL HIGH (ref 70–99)
Potassium: 4.5 mmol/L (ref 3.5–5.1)
Sodium: 140 mmol/L (ref 135–145)

## 2023-03-23 LAB — MAGNESIUM: Magnesium: 2 mg/dL (ref 1.7–2.4)

## 2023-03-23 LAB — T4, FREE: Free T4: 0.77 ng/dL (ref 0.61–1.12)

## 2023-03-23 LAB — TSH: TSH: 5.1 u[IU]/mL — ABNORMAL HIGH (ref 0.350–4.500)

## 2023-03-23 LAB — MRSA NEXT GEN BY PCR, NASAL: MRSA by PCR Next Gen: NOT DETECTED

## 2023-03-23 MED ORDER — ONDANSETRON HCL 4 MG/2ML IJ SOLN: 4.0000 mg | Freq: Four times a day (QID) | INTRAMUSCULAR | Status: DC | PRN

## 2023-03-23 MED ORDER — ADENOSINE 6 MG/2ML IV SOLN: 6.0000 mg | Freq: Once | INTRAVENOUS | Status: AC

## 2023-03-23 MED ORDER — APIXABAN 5 MG PO TABS
5.0000 mg | ORAL_TABLET | Freq: Two times a day (BID) | ORAL | Status: DC
  Administered 2023-03-23 – 2023-03-24 (×3): 5 mg via ORAL
  Filled 2023-03-23 (×3): qty 1

## 2023-03-23 MED ORDER — INSULIN ASPART 100 UNIT/ML IJ SOLN: 0.0000 [IU] | Freq: Every day | INTRAMUSCULAR | Status: DC

## 2023-03-23 MED ORDER — PREDNISOLONE ACETATE 1 % OP SUSP: 1.0000 [drp] | OPHTHALMIC | Status: DC

## 2023-03-23 MED ORDER — PANTOPRAZOLE SODIUM 40 MG PO TBEC
40.0000 mg | DELAYED_RELEASE_TABLET | Freq: Every day | ORAL | Status: DC
  Administered 2023-03-24: 40 mg via ORAL
  Filled 2023-03-23: qty 1

## 2023-03-23 MED ORDER — LACTATED RINGERS IV BOLUS
1000.0000 mL | Freq: Once | INTRAVENOUS | Status: AC
  Administered 2023-03-23: 1000 mL via INTRAVENOUS

## 2023-03-23 MED ORDER — PREDNISOLONE ACETATE 1 % OP SUSP: 4.0000 [drp] | Freq: Two times a day (BID) | OPHTHALMIC | Status: DC

## 2023-03-23 MED ORDER — INSULIN ASPART 100 UNIT/ML IJ SOLN
0.0000 [IU] | Freq: Three times a day (TID) | INTRAMUSCULAR | Status: DC
  Administered 2023-03-24 (×2): 1 [IU] via SUBCUTANEOUS

## 2023-03-23 MED ORDER — KETOROLAC TROMETHAMINE 0.5 % OP SOLN
1.0000 [drp] | Freq: Two times a day (BID) | OPHTHALMIC | Status: DC
  Administered 2023-03-23 – 2023-03-24 (×2): 1 [drp] via OPHTHALMIC
  Filled 2023-03-23: qty 3

## 2023-03-23 MED ORDER — DILTIAZEM LOAD VIA INFUSION
20.0000 mg | Freq: Once | INTRAVENOUS | Status: AC
  Administered 2023-03-23: 20 mg via INTRAVENOUS
  Filled 2023-03-23: qty 20

## 2023-03-23 MED ORDER — PREDNISOLONE ACETATE 1 % OP SUSP
1.0000 [drp] | Freq: Four times a day (QID) | OPHTHALMIC | Status: DC
  Filled 2023-03-23: qty 1

## 2023-03-23 MED ORDER — PREDNISOLONE ACETATE 1 % OP SUSP
1.0000 [drp] | OPHTHALMIC | Status: DC
  Administered 2023-03-23 – 2023-03-24 (×9): 1 [drp] via OPHTHALMIC
  Filled 2023-03-23: qty 1

## 2023-03-23 MED ORDER — BUPROPION HCL ER (XL) 150 MG PO TB24
300.0000 mg | ORAL_TABLET | Freq: Every day | ORAL | Status: DC
  Administered 2023-03-24: 300 mg via ORAL
  Filled 2023-03-23: qty 2

## 2023-03-23 MED ORDER — ADENOSINE 6 MG/2ML IV SOLN
INTRAVENOUS | Status: AC
  Administered 2023-03-23: 6 mg via INTRAVENOUS
  Filled 2023-03-23: qty 8

## 2023-03-23 MED ORDER — ACETAMINOPHEN 325 MG PO TABS: 650.0000 mg | ORAL_TABLET | ORAL | Status: DC | PRN

## 2023-03-23 MED ORDER — DILTIAZEM HCL-DEXTROSE 125-5 MG/125ML-% IV SOLN (PREMIX)
5.0000 mg/h | INTRAVENOUS | Status: DC
  Administered 2023-03-23 (×2): 5 mg/h via INTRAVENOUS
  Filled 2023-03-23 (×2): qty 125

## 2023-03-23 MED ORDER — PREDNISOLONE ACETATE 1 % OP SUSP
1.0000 [drp] | OPHTHALMIC | Status: DC
  Filled 2023-03-23: qty 1

## 2023-03-23 MED ORDER — FOLIC ACID 1 MG PO TABS
3.0000 mg | ORAL_TABLET | ORAL | Status: DC
Start: 2023-03-24 — End: 2023-03-24
  Administered 2023-03-24: 3 mg via ORAL
  Filled 2023-03-23: qty 3

## 2023-03-23 MED ORDER — ROSUVASTATIN CALCIUM 5 MG PO TABS
5.0000 mg | ORAL_TABLET | Freq: Every day | ORAL | Status: DC
  Administered 2023-03-24: 5 mg via ORAL
  Filled 2023-03-23: qty 1

## 2023-03-23 MED ORDER — MOMETASONE FURO-FORMOTEROL FUM 200-5 MCG/ACT IN AERO
2.0000 | INHALATION_SPRAY | Freq: Two times a day (BID) | RESPIRATORY_TRACT | Status: DC
  Administered 2023-03-23 – 2023-03-24 (×2): 2 via RESPIRATORY_TRACT
  Filled 2023-03-23: qty 8.8

## 2023-03-23 MED ORDER — ADENOSINE 6 MG/2ML IV SOLN
6.0000 mg | Freq: Once | INTRAVENOUS | Status: DC
Start: 2023-03-23 — End: 2023-03-23

## 2023-03-23 MED ORDER — CHLORHEXIDINE GLUCONATE CLOTH 2 % EX PADS
6.0000 | MEDICATED_PAD | Freq: Every day | CUTANEOUS | Status: DC
  Administered 2023-03-23 – 2023-03-24 (×2): 6 via TOPICAL

## 2023-03-23 MED ORDER — METOPROLOL TARTRATE 25 MG PO TABS
25.0000 mg | ORAL_TABLET | Freq: Two times a day (BID) | ORAL | Status: DC
  Administered 2023-03-23 – 2023-03-24 (×3): 25 mg via ORAL
  Filled 2023-03-23 (×3): qty 1

## 2023-03-23 NOTE — Plan of Care (Signed)
  Problem: Education: Goal: Knowledge of General Education information will improve Description: Including pain rating scale, medication(s)/side effects and non-pharmacologic comfort measures Outcome: Progressing   Problem: Health Behavior/Discharge Planning: Goal: Ability to manage health-related needs will improve Outcome: Progressing   Problem: Clinical Measurements: Goal: Ability to maintain clinical measurements within normal limits will improve Outcome: Progressing Goal: Will remain free from infection Outcome: Progressing Goal: Diagnostic test results will improve Outcome: Progressing Goal: Respiratory complications will improve Outcome: Progressing Goal: Cardiovascular complication will be avoided Outcome: Progressing   Problem: Activity: Goal: Risk for activity intolerance will decrease Outcome: Progressing   Problem: Nutrition: Goal: Adequate nutrition will be maintained Outcome: Progressing   Problem: Coping: Goal: Level of anxiety will decrease Outcome: Progressing   Problem: Elimination: Goal: Will not experience complications related to bowel motility Outcome: Progressing Goal: Will not experience complications related to urinary retention Outcome: Progressing   Problem: Pain Managment: Goal: General experience of comfort will improve and/or be controlled Outcome: Progressing   Problem: Skin Integrity: Goal: Risk for impaired skin integrity will decrease Outcome: Progressing   Problem: Education: Goal: Ability to describe self-care measures that may prevent or decrease complications (Diabetes Survival Skills Education) will improve Outcome: Progressing Goal: Individualized Educational Video(s) Outcome: Progressing   Problem: Coping: Goal: Ability to adjust to condition or change in health will improve Outcome: Progressing   Problem: Fluid Volume: Goal: Ability to maintain a balanced intake and output will improve Outcome: Progressing    Problem: Health Behavior/Discharge Planning: Goal: Ability to identify and utilize available resources and services will improve Outcome: Progressing Goal: Ability to manage health-related needs will improve Outcome: Progressing   Problem: Metabolic: Goal: Ability to maintain appropriate glucose levels will improve Outcome: Progressing   Problem: Nutritional: Goal: Maintenance of adequate nutrition will improve Outcome: Progressing Goal: Progress toward achieving an optimal weight will improve Outcome: Progressing   Problem: Skin Integrity: Goal: Risk for impaired skin integrity will decrease Outcome: Progressing   Problem: Tissue Perfusion: Goal: Adequacy of tissue perfusion will improve Outcome: Progressing   Problem: Education: Goal: Knowledge of disease or condition will improve Outcome: Progressing Goal: Understanding of medication regimen will improve Outcome: Progressing Goal: Individualized Educational Video(s) Outcome: Progressing   Problem: Activity: Goal: Ability to tolerate increased activity will improve Outcome: Progressing   Problem: Cardiac: Goal: Ability to achieve and maintain adequate cardiopulmonary perfusion will improve Outcome: Progressing   Problem: Health Behavior/Discharge Planning: Goal: Ability to safely manage health-related needs after discharge will improve Outcome: Progressing

## 2023-03-23 NOTE — Telephone Encounter (Signed)
 Patient Product/process development scientist completed.    The patient is insured through [employer]. Patient has Medicare and is not eligible for a copay card, but may be able to apply for patient assistance or Medicare RX Payment Plan (Patient Must reach out to their plan, if eligible for payment plan), if available.    Ran test claim for Eliquis 5 mg and the current 30 day co-pay is $152.71 due to a deductible.   This test claim was processed through [facility]- copay amounts may vary at other pharmacies due to pharmacy/plan contracts, or as the patient moves through the different stages of their insurance plan.     Roland Earl, CPHT Pharmacy Technician III Certified Patient Advocate [facility] Patient Advocate Team Direct Number: [phone]  Fax: [phone]

## 2023-03-23 NOTE — Progress Notes (Signed)
 Patient has home CPAP unit to use tonight. Safety check performed, plugged into red outlet and unit ready for use. Patient able to use independently.

## 2023-03-23 NOTE — Care Management Obs Status (Signed)
 MEDICARE OBSERVATION STATUS NOTIFICATION   Patient Details  Name: [name] MRN: [id number] Date of Birth: 01/09/1965   Medicare Observation Status Notification Given:  Yes    Barron Alvine, RN 03/23/2023, 8:10 PM

## 2023-03-23 NOTE — H&P (Signed)
 History and Physical    Patient: [name] GNF:[id number] DOB: 12/22/1965 DOA: 03/23/2023 DOS: the patient was seen and examined on 03/23/2023 PCP: Judy Pimple, MD  Patient coming from: Home  Chief Complaint:  Chief Complaint  Patient presents with   Tachycardia   HPI: [name] is a 58 y.o. female with medical history significant of hypertension, type 2 diabetes mellitus, fibromyalgia, GERD, hypothyroidism and history of obstructive sleep apnea; who presented to the hospital secondary to palpitations.  Patient reports symptoms presented suddenly after she woke up this morning with associated symptoms of nausea/vomiting and diarrhea; and subsequent perception of feeling her heart racing.  Patient reports never experiencing similar symptoms before.  Patient denies any fever, shortness of breath, focal neurologic deficit, dysuria, hematuria, melena, hematochezia, abdominal pain or any other complaints.    Of note, patient daughter was sick with the flu last week but tested negative during nasal swab.  In the ED patient received adenosine without suppression and was started on Cardizem drip.  Cardiology service consulted and [facility] contacted to place patient in the hospital for further evaluation and management.  Review of Systems: As mentioned in the history of present illness. All other systems reviewed and are negative. Past Medical History:  Diagnosis Date   ADHD (attention deficit hyperactivity disorder)    ADD - no meds   Allergy    allergic rhinitis   Anxiety    Asthma    Cataract 2015?   Already had them removed   Chronic headaches    Degenerative disc disease    Depression    Diabetes mellitus without complication (HCC)    type 2   Eczema    no current problems as of 07/09/21   Fatty liver    Fibromyalgia    Fx of fibula 2009   GERD (gastroesophageal reflux disease)    Hemorrhoids    Hypertension    Hyperthyroidism    Hypothyroidism    IBS (irritable  bowel syndrome)    Iritis, chronic    Macular hole of left eye 06/2017   PONV (postoperative nausea and vomiting)    Right leg DVT (HCC)    Sleep apnea    uses CPAP nightly   Vitamin D insufficiency 06/16/2019   Past Surgical History:  Procedure Laterality Date   ABDOMINAL HYSTERECTOMY     ANTERIOR CRUCIATE LIGAMENT REPAIR Left 1996   small tear of ACL   BREAST EXCISIONAL BIOPSY Left 1995   BREAST SURGERY     left nipple removed - benign tumor   BUNIONECTOMY Bilateral    COLONOSCOPY     EYE SURGERY     cataracts removed, macular hole repaired left eye- both surgeries done at [facility]   FRACTURE SURGERY  July 2023   [place] plateau   LAPAROSCOPY  08/2004   endometriosis - several    ORIF TIBIA FRACTURE Right 07/10/2021   Procedure: OPEN REDUCTION INTERNAL FIXATION (ORIF) TIBIAL PLATEAU FRACTURE;  Surgeon: Myrene Galas, MD;  Location: [facility] OR;  Service: Orthopedics;  Laterality: Right;   WISDOM TOOTH EXTRACTION     Social History:  reports that she has never smoked. She has never used smokeless tobacco. She reports that she does not drink alcohol and does not use drugs.  Allergies  Allergen Reactions   Amlodipine     Pedal edema    Cetirizine Hcl Other (See Comments)    Not effective   Erythromycin Nausea Only and Other (See Comments)    Caused  a stomach ache   Escitalopram Oxalate Other (See Comments)    "No improvement"   Lisinopril Cough   Loratadine Other (See Comments)    "Not effective"   Nifedipine Other (See Comments)    "Fatigue and tremors"   Sertraline Hcl Other (See Comments)    "Did not help"   Vyvanse [Lisdexamfetamine Dimesylate]     Headache and excitablility    Family History  Problem Relation Age of Onset   Hypertension Mother    Thyroid disease Mother    Fibromyalgia Mother    Colon polyps Mother    Arthritis Mother    Diabetes Mother    Diabetes Father    Stroke Father    Kidney cancer Maternal Grandmother    Colon cancer Neg Hx     Esophageal cancer Neg Hx    Stomach cancer Neg Hx    Rectal cancer Neg Hx    Allergic rhinitis Neg Hx    Angioedema Neg Hx    Asthma Neg Hx    Atopy Neg Hx    Eczema Neg Hx    Immunodeficiency Neg Hx    Urticaria Neg Hx    Breast cancer Neg Hx     Prior to Admission medications   Medication Sig Start Date End Date Taking? Authorizing Provider  acetaminophen (TYLENOL) 500 MG tablet Take 1 tablet (500 mg total) by mouth every 12 (twelve) hours. Patient not taking: Reported on 03/16/2023 07/11/21   Montez Morita, PA-C  ADVIL 200 MG CAPS Take 400 mg by mouth every 6 (six) hours as needed (for mild pain). Patient not taking: Reported on 02/01/2023    [provider]  albuterol (PROVENTIL) (2.5 MG/3ML) 0.083% nebulizer solution Take 3 mLs (2.5 mg total) by nebulization every 4 (four) hours as needed for wheezing or shortness of breath. Patient not taking: Reported on 02/01/2023 02/26/21   Alfonse Spruce, MD  albuterol (VENTOLIN HFA) 108 (90 Base) MCG/ACT inhaler INHALE 2 PUFFS BY MOUTH EVERY 6 HOURS AS NEEDED FOR WHEEZING OR SHORTNESS OF BREATH Patient not taking: Reported on 03/16/2023 12/17/22   Alfonse Spruce, MD  amphetamine-dextroamphetamine (ADDERALL) 20 MG tablet Take 1 tablet (20 mg total) by mouth 2 (two) times daily. Patient not taking: Reported on 03/16/2023 02/13/22   Judy Pimple, MD  BD VEO INSULIN SYRINGE U/F 31G X 15/64" 1 ML MISC  05/12/21   [provider]  budesonide-formoterol (SYMBICORT) 160-4.5 MCG/ACT inhaler Inhale 2 puffs into the lungs 2 (two) times daily as needed. 05/27/22   Alfonse Spruce, MD  buPROPion (WELLBUTRIN XL) 300 MG 24 hr tablet Take 1 tablet by mouth once daily 10/26/22   Tower, Audrie Gallus, MD  CALCIUM-VITAMIN D PO Take 1 capsule by mouth in the morning and at bedtime.    [provider]  carvedilol (COREG) 6.25 MG tablet TAKE 1 TABLET BY MOUTH TWICE DAILY WITH A MEAL 02/23/23   Tower, Audrie Gallus, MD  cetirizine (ZYRTEC) 10  MG tablet Take 10 mg by mouth daily as needed for allergies or rhinitis.    [provider]  chlorthalidone (HYGROTON) 25 MG tablet Take 1 tablet by mouth once daily 02/23/23   Tower, Audrie Gallus, MD  esomeprazole (NEXIUM) 20 MG capsule Take 1 capsule by mouth at bedtime 02/23/23   Tower, Audrie Gallus, MD  folic acid (FOLVITE) 1 MG tablet Take 3 mg by mouth See admin instructions. Take 3 mg by mouth once a day on Wed/Thurs/Fri/Sat    [provider]  glucose blood (ONETOUCH VERIO) test strip USE 1 STRIP TO CHECK GLUCOSE TWICE DAILY AND AS NEEDED FOR DIABETES 12/24/22   Tower, Audrie Gallus, MD  HUMIRA PEN 40 MG/0.8ML PNKT Inject 40 mg into the skin every Monday. 04/30/21   [provider]  ketorolac (ACULAR) 0.5 % ophthalmic solution Place 1 drop into the left eye 2 (two) times daily. 11/24/18   [provider]  Lancets (ONETOUCH DELICA PLUS LANCET33G) MISC USE 1 LANCET TO CHECK GLUCOSE TWICE DAILY 10/26/22   Tower, Audrie Gallus, MD  leucovorin (WELLCOVORIN) 5 MG tablet Take 5 mg by mouth See admin instructions. Take 5 mg by mouth once a day on only Sundays and Tuesdays    [provider]  MAGNESIUM GLYCINATE PO Take 1 capsule by mouth at bedtime.    [provider]  metFORMIN (GLUCOPHAGE) 1000 MG tablet TAKE 1 TABLET BY MOUTH TWICE DAILY WITH A MEAL 02/23/23   Tower, Audrie Gallus, MD  methocarbamol (ROBAXIN) 500 MG tablet Take 1 tablet (500 mg total) by mouth every 8 (eight) hours as needed for muscle spasms (back pain). Caution of sedation 07/29/22   Tower, Audrie Gallus, MD  Methotrexate 25 MG/ML SOSY Inject 17.5 mg into the skin every Monday.    [provider]  methotrexate 50 MG/2ML injection SMARTSIG:0.9 Milliliter(s) SUB-Q Once a Week 04/30/22   [provider]  phenazopyridine (PYRIDIUM) 100 MG tablet Take 1 tablet (100 mg total) by mouth 3 (three) times daily as needed for pain. 01/20/23   Particia Nearing, PA-C  prednisoLONE acetate (PRED FORTE) 1 %  ophthalmic suspension Place 4 drops into the left eye 2 (two) times daily.    [provider]  promethazine-dextromethorphan (PROMETHAZINE-DM) 6.25-15 MG/5ML syrup Take 5 mLs by mouth 4 (four) times daily as needed. 12/19/22   Particia Nearing, PA-C  rosuvastatin (CRESTOR) 5 MG tablet Take 1 tablet (5 mg total) by mouth daily. 02/26/23   Tower, Audrie Gallus, MD  Semaglutide, 1 MG/DOSE, 4 MG/3ML SOPN Inject 1 mg as directed once a week. 01/12/23   Tower, Audrie Gallus, MD  traMADol (ULTRAM) 50 MG tablet Take 1 tablet (50 mg total) by mouth every 6 (six) hours as needed for severe pain (for fibromyalgia flares and back pain). Patient not taking: Reported on 03/16/2023 07/29/22   Judy Pimple, MD    Physical Exam: Vitals:   03/23/23 1015 03/23/23 1017 03/23/23 1030 03/23/23 1100  BP:   118/83 (!) 133/94  Pulse: 90 91 78 93  Resp: 14 19 14 17   SpO2: 97% 96%  98%  Weight:      Height:       General exam: Alert, awake, oriented x 3; no chest pain. Respiratory system: Good saturation on room air. Cardiovascular system: Irregular, no rubs, no gallops, no JVD. Gastrointestinal system: Abdomen is obese, nondistended, soft and nontender. No organomegaly or masses felt. Normal bowel sounds heard. Central nervous system: Alert and oriented. No focal neurological deficits. Extremities: No cyanosis or clubbing. Skin: No petechiae. Psychiatry: Judgement and insight appear normal. Mood & affect appropriate.   Data Reviewed: TSH: 5.100 Free T4: Pending A1c 7.0 Magnesium 2.0 Respiratory panel: Negative for COVID, influenza and RSV CBC: WBC 12.7, hemoglobin 15.8 and platelet count 370K Basic metabolic panel: Sodium 140, potassium 4.5, chloride 103, bicarb 22, BUN 12, creatinine 0.85 and GFR >60   Assessment and Plan: 1-atrial fibrillation with RVR -New diagnosis -Continue diltiazem and metoprolol -Follow electrolytes and replete as  needed -2D echo once rate controlled -Continue  Eliquis -Follow clinical response and follow further recommendation by cardiology service. -CHADS2VASC  score 3  2-hypertension -Stable and well-controlled -Follow vital signs especially with addition of Cardizem  3-type 2 diabetes -A1c 7.0 -Holding oral hypoglycemic agents while inpatient -Sliding scale insulin has been ordered and will adjust treatment based on CBG fluctuation.  4-fibromyalgia -Continue home analgesic therapy.  5-obstructive sleep apnea -Continue CPAP nightly  6-GERD/GI prophylaxis -Continue PPI  7-hyperlipidemia -Continue statin.  8-nausea/vomiting and diarrhea -Isolated episodes and had no further experience any symptoms -Continue supportive care -Questionable reaction to food poisoning.  9-depression/anxiety -No suicidal ideation hallucination -Continue the use of Wellbutrin.    Advance Care Planning:   Code Status: Full Code   Consults: Cardiology service  Family Communication: No family at bedside.  Severity of Illness: The appropriate patient status for this patient is OBSERVATION. Observation status is judged to be reasonable and necessary in order to provide the required intensity of service to ensure the patient's safety. The patient's presenting symptoms, physical exam findings, and initial radiographic and laboratory data in the context of their medical condition is felt to place them at decreased risk for further clinical deterioration. Furthermore, it is anticipated that the patient will be medically stable for discharge from the hospital within 2 midnights of admission.   Author: Vassie Loll, MD 03/23/2023 12:00 PM  For on call review [email or web address].

## 2023-03-23 NOTE — Progress Notes (Signed)
 Patient requests not to be disturbed after night med pass. Explained to patient out assessment outline for patient care and frequency of assessments based on her current medical condition. Patient verbalizes understanding and continues to request not to be disturbed during the night. Patient also requests to have her morning labs collected last to allow her to try and rest. This has been communicated to care team involved this shift

## 2023-03-23 NOTE — ED Triage Notes (Signed)
 Pt reports this morning at 0400 she woke up with 1 episode of diarrhea and vomiting. Ever since then she has felt as if her heart rate was very high. She called her doctor and they sent her to the ED. No hx of same.

## 2023-03-23 NOTE — Consult Note (Addendum)
 Cardiology Consultation   Patient ID: [name] MRN: [id number]; DOB: 01-01-1966  Admit date: 03/23/2023 Date of Consult: 03/23/2023  PCP:  Judy Pimple, MD   [facility] Providers Cardiologist:  None        Patient Profile:   [name] is a 58 y.o. female with a hx of with history of HTN, DM2, fibromyalgia, borderline hypothyroid. who is being seen 03/23/2023 for the evaluation of Afib with RVR at the request of Dr. Denton Lank.  History of Present Illness:   [name] with above history woke at 4 am with nausea/vomiting/diarrhea and then felt her heart racing. Never had before. Daughter sick with flu like symptoms(URI) last week but tested negative yesterday. Patient had [employer] for dinner. Found to be in Afib with RVR didn't change with adenosine and now on IV diltiazem. Has been borderline hypothyroid and didn't feel any differnet with treatment so stopped it. TSH 5.1 today. Other labs stable except WBC up 12.1.   Past Medical History:  Diagnosis Date   ADHD (attention deficit hyperactivity disorder)    ADD - no meds   Allergy    allergic rhinitis   Anxiety    Asthma    Cataract 2015?   Already had them removed   Chronic headaches    Degenerative disc disease    Depression    Diabetes mellitus without complication (HCC)    type 2   Eczema    no current problems as of 07/09/21   Fatty liver    Fibromyalgia    Fx of fibula 2009   GERD (gastroesophageal reflux disease)    Hemorrhoids    Hypertension    Hyperthyroidism    Hypothyroidism    IBS (irritable bowel syndrome)    Iritis, chronic    Macular hole of left eye 06/2017   PONV (postoperative nausea and vomiting)    Right leg DVT (HCC)    Sleep apnea    uses CPAP nightly   Vitamin D insufficiency 06/16/2019    Past Surgical History:  Procedure Laterality Date   ABDOMINAL HYSTERECTOMY     ANTERIOR CRUCIATE LIGAMENT REPAIR Left 1996   small tear of ACL   BREAST EXCISIONAL BIOPSY  Left 1995   BREAST SURGERY     left nipple removed - benign tumor   BUNIONECTOMY Bilateral    COLONOSCOPY     EYE SURGERY     cataracts removed, macular hole repaired left eye- both surgeries done at [facility]   FRACTURE SURGERY  July 2023   [place] plateau   LAPAROSCOPY  08/2004   endometriosis - several    ORIF TIBIA FRACTURE Right 07/10/2021   Procedure: OPEN REDUCTION INTERNAL FIXATION (ORIF) TIBIAL PLATEAU FRACTURE;  Surgeon: Myrene Galas, MD;  Location: [facility] OR;  Service: Orthopedics;  Laterality: Right;   WISDOM TOOTH EXTRACTION       Home Medications:  Prior to Admission medications   Medication Sig Start Date End Date Taking? Authorizing Provider  acetaminophen (TYLENOL) 500 MG tablet Take 1 tablet (500 mg total) by mouth every 12 (twelve) hours. Patient not taking: Reported on 03/16/2023 07/11/21   Montez Morita, PA-C  ADVIL 200 MG CAPS Take 400 mg by mouth every 6 (six) hours as needed (for mild pain). Patient not taking: Reported on 02/01/2023    [provider]  albuterol (PROVENTIL) (2.5 MG/3ML) 0.083% nebulizer solution Take 3 mLs (2.5 mg total) by nebulization every 4 (four) hours as needed for wheezing or  shortness of breath. Patient not taking: Reported on 02/01/2023 02/26/21   Alfonse Spruce, MD  albuterol (VENTOLIN HFA) 108 (90 Base) MCG/ACT inhaler INHALE 2 PUFFS BY MOUTH EVERY 6 HOURS AS NEEDED FOR WHEEZING OR SHORTNESS OF BREATH Patient not taking: Reported on 03/16/2023 12/17/22   Alfonse Spruce, MD  amphetamine-dextroamphetamine (ADDERALL) 20 MG tablet Take 1 tablet (20 mg total) by mouth 2 (two) times daily. Patient not taking: Reported on 03/16/2023 02/13/22   Judy Pimple, MD  BD VEO INSULIN SYRINGE U/F 31G X 15/64" 1 ML MISC  05/12/21   [provider]  budesonide-formoterol (SYMBICORT) 160-4.5 MCG/ACT inhaler Inhale 2 puffs into the lungs 2 (two) times daily as needed. 05/27/22   Alfonse Spruce, MD  buPROPion (WELLBUTRIN XL) 300 MG 24 hr  tablet Take 1 tablet by mouth once daily 10/26/22   Tower, Audrie Gallus, MD  CALCIUM-VITAMIN D PO Take 1 capsule by mouth in the morning and at bedtime.    [provider]  carvedilol (COREG) 6.25 MG tablet TAKE 1 TABLET BY MOUTH TWICE DAILY WITH A MEAL 02/23/23   Tower, Idamae Schuller A, MD  cephALEXin (KEFLEX) 500 MG capsule Take 1 capsule (500 mg total) by mouth 2 (two) times daily. Patient not taking: Reported on 03/16/2023 01/20/23   Particia Nearing, PA-C  cetirizine (ZYRTEC) 10 MG tablet Take 10 mg by mouth daily as needed for allergies or rhinitis.    [provider]  chlorthalidone (HYGROTON) 25 MG tablet Take 1 tablet by mouth once daily 02/23/23   Tower, Audrie Gallus, MD  diphenhydrAMINE (BENADRYL) 25 mg capsule Take 25-50 mg by mouth every 6 (six) hours as needed for allergies. Patient not taking: Reported on 02/01/2023    [provider]  esomeprazole (NEXIUM) 20 MG capsule Take 1 capsule by mouth at bedtime 02/23/23   Tower, Audrie Gallus, MD  folic acid (FOLVITE) 1 MG tablet Take 3 mg by mouth See admin instructions. Take 3 mg by mouth once a day on Wed/Thurs/Fri/Sat    [provider]  glucose blood (ONETOUCH VERIO) test strip USE 1 STRIP TO CHECK GLUCOSE TWICE DAILY AND AS NEEDED FOR DIABETES 12/24/22   Tower, Audrie Gallus, MD  HUMIRA PEN 40 MG/0.8ML PNKT Inject 40 mg into the skin every Monday. 04/30/21   [provider]  ketorolac (ACULAR) 0.5 % ophthalmic solution Place 1 drop into the left eye 2 (two) times daily. 11/24/18   [provider]  Lancets (ONETOUCH DELICA PLUS LANCET33G) MISC USE 1 LANCET TO CHECK GLUCOSE TWICE DAILY 10/26/22   Tower, Audrie Gallus, MD  leucovorin (WELLCOVORIN) 5 MG tablet Take 5 mg by mouth See admin instructions. Take 5 mg by mouth once a day on only Sundays and Tuesdays    [provider]  MAGNESIUM GLYCINATE PO Take 1 capsule by mouth at bedtime.    [provider]  metFORMIN (GLUCOPHAGE) 1000 MG tablet TAKE 1  TABLET BY MOUTH TWICE DAILY WITH A MEAL 02/23/23   Tower, Audrie Gallus, MD  methocarbamol (ROBAXIN) 500 MG tablet Take 1 tablet (500 mg total) by mouth every 8 (eight) hours as needed for muscle spasms (back pain). Caution of sedation 07/29/22   Tower, Audrie Gallus, MD  Methotrexate 25 MG/ML SOSY Inject 17.5 mg into the skin every Monday.    [provider]  methotrexate 50 MG/2ML injection SMARTSIG:0.9 Milliliter(s) SUB-Q Once a Week 04/30/22   [provider]  phenazopyridine (PYRIDIUM) 100 MG tablet Take 1  tablet (100 mg total) by mouth 3 (three) times daily as needed for pain. 01/20/23   Particia Nearing, PA-C  prednisoLONE acetate (PRED FORTE) 1 % ophthalmic suspension Place 4 drops into the left eye 2 (two) times daily.    [provider]  promethazine-dextromethorphan (PROMETHAZINE-DM) 6.25-15 MG/5ML syrup Take 5 mLs by mouth 4 (four) times daily as needed. 12/19/22   Particia Nearing, PA-C  rosuvastatin (CRESTOR) 5 MG tablet Take 1 tablet (5 mg total) by mouth daily. 02/26/23   Tower, Audrie Gallus, MD  Semaglutide, 1 MG/DOSE, 4 MG/3ML SOPN Inject 1 mg as directed once a week. 01/12/23   Tower, Audrie Gallus, MD  traMADol (ULTRAM) 50 MG tablet Take 1 tablet (50 mg total) by mouth every 6 (six) hours as needed for severe pain (for fibromyalgia flares and back pain). Patient not taking: Reported on 03/16/2023 07/29/22   Judy Pimple, MD    Inpatient Medications: Scheduled Meds:  apixaban  5 mg Oral BID   insulin aspart  0-5 Units Subcutaneous QHS   insulin aspart  0-9 Units Subcutaneous TID WC   metoprolol tartrate  25 mg Oral BID   Continuous Infusions:  diltiazem (CARDIZEM) infusion 5 mg/hr (03/23/23 0952)   PRN Meds: acetaminophen, ondansetron (ZOFRAN) IV  Allergies:    Allergies  Allergen Reactions   Amlodipine     Pedal edema    Cetirizine Hcl Other (See Comments)    Not effective   Erythromycin Nausea Only and Other (See Comments)    Caused a stomach ache    Escitalopram Oxalate Other (See Comments)    "No improvement"   Lisinopril Cough   Loratadine Other (See Comments)    "Not effective"   Nifedipine Other (See Comments)    "Fatigue and tremors"   Sertraline Hcl Other (See Comments)    "Did not help"   Vyvanse [Lisdexamfetamine Dimesylate]     Headache and excitablility    Social History:   Social History   Socioeconomic History   Marital status: Divorced    Spouse name: Not on file   Number of children: 1   Years of education: Not on file   Highest education level: Associate degree: occupational, Scientist, product/process development, or vocational program  Occupational History   Occupation: Disabled due to fibromyalgia    Employer: UNEMPLOYED  Tobacco Use   Smoking status: Never   Smokeless tobacco: Never  Vaping Use   Vaping status: Never Used  Substance and Sexual Activity   Alcohol use: Never   Drug use: Never   Sexual activity: Not Currently    Birth control/protection: None  Other Topics Concern   Not on file  Social History Narrative   1 caffeine drink daily    Social Drivers of Health   Financial Resource Strain: Low Risk  (11/29/2022)   Overall Financial Resource Strain (CARDIA)    Difficulty of Paying Living Expenses: Not hard at all  Food Insecurity: No Food Insecurity (11/29/2022)   Hunger Vital Sign    Worried About Running Out of Food in the Last Year: Never true    Ran Out of Food in the Last Year: Never true  Transportation Needs: No Transportation Needs (11/29/2022)   PRAPARE - Administrator, Civil Service (Medical): No    Lack of Transportation (Non-Medical): No  Physical Activity: Unknown (11/29/2022)   Exercise Vital Sign    Days of Exercise per Week: Patient declined    Minutes of Exercise per Session: 0 min  Stress: No Stress Concern Present (11/29/2022)   [employer] of Occupational Health - Occupational Stress Questionnaire    Feeling of Stress : Only a little  Social Connections: Moderately  Integrated (11/29/2022)   Social Connection and Isolation Panel [NHANES]    Frequency of Communication with Friends and Family: Twice a week    Frequency of Social Gatherings with Friends and Family: More than three times a week    Attends Religious Services: More than 4 times per year    Active Member of [employer] or Organizations: Yes    Attends Engineer, structural: More than 4 times per year    Marital Status: Divorced  Intimate Partner Violence: Not At Risk (08/06/2022)   Humiliation, Afraid, Rape, and Kick questionnaire    Fear of Current or Ex-Partner: No    Emotionally Abused: No    Physically Abused: No    Sexually Abused: No    Family History:     Family History  Problem Relation Age of Onset   Hypertension Mother    Thyroid disease Mother    Fibromyalgia Mother    Colon polyps Mother    Arthritis Mother    Diabetes Mother    Diabetes Father    Stroke Father    Kidney cancer Maternal Grandmother    Colon cancer Neg Hx    Esophageal cancer Neg Hx    Stomach cancer Neg Hx    Rectal cancer Neg Hx    Allergic rhinitis Neg Hx    Angioedema Neg Hx    Asthma Neg Hx    Atopy Neg Hx    Eczema Neg Hx    Immunodeficiency Neg Hx    Urticaria Neg Hx    Breast cancer Neg Hx      ROS:  Please see the history of present illness.  Review of Systems  Cardiovascular:  Positive for irregular heartbeat and palpitations.  Musculoskeletal:  Positive for myalgias.  Gastrointestinal:  Positive for diarrhea, nausea and vomiting.  Neurological:  Positive for weakness.    All other ROS reviewed and negative.     Physical Exam/Data:   Vitals:   03/23/23 1015 03/23/23 1017 03/23/23 1030 03/23/23 1100  BP:   118/83 (!) 133/94  Pulse: 90 91 78 93  Resp: 14 19 14 17   SpO2: 97% 96%  98%  Weight:      Height:       No intake or output data in the 24 hours ending 03/23/23 1141    03/23/2023    9:17 AM 03/16/2023    2:45 PM 02/01/2023    2:23 PM  Last 3 Weights  Weight  (lbs) 175 lb 175 lb 177 lb  Weight (kg) 79.379 kg 79.379 kg 80.287 kg     Body mass index is 27 kg/m.  General:  Well nourished, well developed, in no acute distress  HEENT: normal Neck: no JVD Vascular: No carotid bruits; Distal pulses 2+ bilaterally Cardiac:  normal S1, S2; irreg irreg; no murmur   Lungs:  clear to auscultation bilaterally, no wheezing, rhonchi or rales  Abd: soft, nontender, no hepatomegaly  Ext: no edema Musculoskeletal:  No deformities, BUE and BLE strength normal and equal Skin: warm and dry  Neuro:  CNs 2-12 intact, no focal abnormalities noted Psych:  Normal affect   EKG:  The EKG was personally reviewed and demonstrates:  initial EKG looks like SVT-narrow complex tachycardia 170/m f/u Afib 107/m Telemetry:  Telemetry was personally reviewed and demonstrates:  Afib ~  110/m  Relevant CV Studies:    Laboratory Data:  High Sensitivity Troponin:  No results for input(s): "TROPONINIHS" in the last 720 hours.   Chemistry Recent Labs  Lab 03/23/23 0938  NA 140  K 4.5  CL 103  CO2 22  GLUCOSE 148*  BUN 12  CREATININE 0.85  CALCIUM 9.8  GFRNONAA >60  ANIONGAP 15    No results for input(s): "PROT", "ALBUMIN", "AST", "ALT", "ALKPHOS", "BILITOT" in the last 168 hours. Lipids No results for input(s): "CHOL", "TRIG", "HDL", "LABVLDL", "LDLCALC", "CHOLHDL" in the last 168 hours.  Hematology Recent Labs  Lab 03/23/23 0938  WBC 12.7*  RBC 5.52*  HGB 15.8*  HCT 48.4*  MCV 87.7  MCH 28.6  MCHC 32.6  RDW 14.2  PLT 370   Thyroid  Recent Labs  Lab 03/23/23 0938  TSH 5.100*    BNPNo results for input(s): "BNP", "PROBNP" in the last 168 hours.  DDimer No results for input(s): "DDIMER" in the last 168 hours.   Radiology/Studies:  No results found.   Assessment and Plan:   Afib with RVR new diagnosis for patient, slowing down with IV diltiazem, metoprolol 25 mg bid, eliquis 5 mg bid. Check echo once HR slows down  N/V/D ? Viral illness. Will  check flu panel(daughter sick last wee)  HTN PTA coreg 6.25 mg bid, hygroton 25 mg daily  DM2 per primary team.  HLD on crestor  Risk Assessment/Risk Scores:   CHA2DS2-VASc Score =   3  This indicates a  % annual risk of stroke. The patient's score is based upon:       For questions or updates, please contact [facility] Please consult [email or web address] for contact info under    Signed, [name] Reedy, PA-C  03/23/2023 11:41 AM   Pt seen and examined   I agree with findings as noted by [name] above Pt is a 58 yo with hx of HTN (years), T2DM (several years), OSA (on CPAP)   Woke up this morning at about 4 AM with heart racing   Never had before     Came [facility]   Initially felt to be SVT  Given adenosine and slowed to atrial fibrillation   She was placed on IV diltiazem   Still in afib with controlled rates   Note that pt does not sense anything abnormal now     On exam,  Comfortable in bed Neck   JVP is normal  No bruits  Lungsare CTA Cardiac Irreg irreg No S3  No murmurs  Abd is supple  No hepatomegaly Ext 2+ pulses  No edema    Atrial fib   New dx for pt    She was symptomatic when going fast but does not sense now   Not completely clear then when it started      REcomm   Agree with diltiazem   Can transition to po CHADsVASc score is 3     Needs Eliquis or Xarelto     Follow HR   IF controlled then would plan DCCV in 4 wks   If unable to control when active then will need TEE/DCCV  Echo ordered     Thyroid panel pending         2  HTN  Follow with addition of diltiazem       May need to adjust other meds   3  T2 DM  A1C of 7   PT says she is not careful  on carbs  discussed importance of low carb diet, lots of vegetables Needs tighter control  4  OSA  Continue CPAP     [name] Pates MD

## 2023-03-23 NOTE — Telephone Encounter (Signed)
  Chief Complaint: tachycardia Symptoms: 1 episode of vomiting, 1 episode of diarrhea, heart rate 187 Frequency: x 4 hours Pertinent Negatives: Patient denies chest pain, SOB, lightheaded, sweating. Disposition: [x] ED /[] Urgent Care (no appt availability in office) / [] Appointment(In office/virtual)/ []  [facility]/ [] Home Care/ [] Refused Recommended Disposition /[] [facility]/ []  Follow-up with PCP Additional Notes: Patient states she woke up this morning around 0400 and felt unwell, had an episode of vomiting and diarrhea. She states she put on her apple watch and her heart rate was 160-200. At this time her heart rate is 187. She states it feels like it is beating rapidly. Patient verbalizes understanding that if she feels lightheaded/dizzy/passes out/begins sweating to call 911 immediately, refused to call 911 at this time. Patient states she lives 10 minutes from the ED and has someone that can drive her immediately to ED.  Copied from CRM [id number]. Topic: Clinical - Red Word Triage >> Mar 23, 2023  8:04 AM Pascal Lux wrote: Red Word that prompted transfer to Nurse Triage: Patient woke up not feeling well, throwing up with diarrhea and heart rate 160-200. Reason for Disposition  [1] Heart beating very rapidly (e.g., > 140 / minute) AND [2] present now  (Exception: During exercise.)  Answer Assessment - Initial Assessment Questions 1. DESCRIPTION: "Please describe your heart rate or heartbeat that you are having" (e.g., fast/slow, regular/irregular, skipped or extra beats, "palpitations")     Feels like heart beat is really fast.  2. ONSET: "When did it start?" (Minutes, hours or days)      This morning around 0400.  3. DURATION: "How long does it last" (e.g., seconds, minutes, hours)     Ongoing since 0400, about 4 hours.  4. PATTERN "Does it come and go, or has it been constant since it started?"  "Does it get worse with exertion?"   "Are you feeling it now?"      Constant .  5. TAP: "Using your hand, can you tap out what you are feeling on a chair or table in front of you, so that I can hear?" (Note: not all patients can do this)       N/A.  6. HEART RATE: "Can you tell me your heart rate?" "How many beats in 15 seconds?"  (Note: not all patients can do this)       Currently heart rate is 187.  7. RECURRENT SYMPTOM: "Have you ever had this before?" If Yes, ask: "When was the last time?" and "What happened that time?"      Denies.  8. CAUSE: "What do you think is causing the palpitations?"     Unsure.  9. CARDIAC HISTORY: "Do you have any history of heart disease?" (e.g., heart attack, angina, bypass surgery, angioplasty, arrhythmia)      High blood pressure.  10. OTHER SYMPTOMS: "Do you have any other symptoms?" (e.g., dizziness, chest pain, sweating, difficulty breathing)       Diarrhea x 1 episode, vomiting x 1 episode.  11. PREGNANCY: "Is there any chance you are pregnant?" "When was your last menstrual period?"        N/A.  Protocols used: Heart Rate and Heartbeat Questions-A-AH

## 2023-03-23 NOTE — ED Provider Notes (Addendum)
 [place] EMERGENCY DEPARTMENT AT [facility] Provider Note   CSN: [id number] Arrival date & time: 03/23/23  0846     History  Chief Complaint  Patient presents with   Tachycardia    [name] is a 58 y.o. female.  Patient presents with palpitations. Patient indicates awoke early this AM, had an episodes of nvd (non bloody) and then was aware that her heart rate was fast. No chest pain or discomfort. No sob. No hx same. No hx svt, afib, or heart disease. Symptoms constant since onset. No syncope. No recent change in meds or new meds. Adderall listed as a med but has not taken lately. No excessive caffeine use. No drug or other stimulant use. No heavy etoh use.  No recent wt loss, heat intolerance, or sweats. No fever or chills.   The history is provided by the patient and medical records.       Home Medications Prior to Admission medications   Medication Sig Start Date End Date Taking? Authorizing Provider  acetaminophen (TYLENOL) 500 MG tablet Take 1 tablet (500 mg total) by mouth every 12 (twelve) hours. Patient not taking: Reported on 03/16/2023 07/11/21   Montez Morita, PA-C  ADVIL 200 MG CAPS Take 400 mg by mouth every 6 (six) hours as needed (for mild pain). Patient not taking: Reported on 02/01/2023    [provider]  albuterol (PROVENTIL) (2.5 MG/3ML) 0.083% nebulizer solution Take 3 mLs (2.5 mg total) by nebulization every 4 (four) hours as needed for wheezing or shortness of breath. Patient not taking: Reported on 02/01/2023 02/26/21   Alfonse Spruce, MD  albuterol (VENTOLIN HFA) 108 (90 Base) MCG/ACT inhaler INHALE 2 PUFFS BY MOUTH EVERY 6 HOURS AS NEEDED FOR WHEEZING OR SHORTNESS OF BREATH Patient not taking: Reported on 03/16/2023 12/17/22   Alfonse Spruce, MD  amphetamine-dextroamphetamine (ADDERALL) 20 MG tablet Take 1 tablet (20 mg total) by mouth 2 (two) times daily. Patient not taking: Reported on 03/16/2023 02/13/22   Judy Pimple,  MD  BD VEO INSULIN SYRINGE U/F 31G X 15/64" 1 ML MISC  05/12/21   [provider]  budesonide-formoterol (SYMBICORT) 160-4.5 MCG/ACT inhaler Inhale 2 puffs into the lungs 2 (two) times daily as needed. 05/27/22   Alfonse Spruce, MD  buPROPion (WELLBUTRIN XL) 300 MG 24 hr tablet Take 1 tablet by mouth once daily 10/26/22   Tower, Audrie Gallus, MD  CALCIUM-VITAMIN D PO Take 1 capsule by mouth in the morning and at bedtime.    [provider]  carvedilol (COREG) 6.25 MG tablet TAKE 1 TABLET BY MOUTH TWICE DAILY WITH A MEAL 02/23/23   Tower, Idamae Schuller A, MD  cephALEXin (KEFLEX) 500 MG capsule Take 1 capsule (500 mg total) by mouth 2 (two) times daily. Patient not taking: Reported on 03/16/2023 01/20/23   Particia Nearing, PA-C  cetirizine (ZYRTEC) 10 MG tablet Take 10 mg by mouth daily as needed for allergies or rhinitis.    [provider]  chlorthalidone (HYGROTON) 25 MG tablet Take 1 tablet by mouth once daily 02/23/23   Tower, Audrie Gallus, MD  diphenhydrAMINE (BENADRYL) 25 mg capsule Take 25-50 mg by mouth every 6 (six) hours as needed for allergies. Patient not taking: Reported on 02/01/2023    [provider]  esomeprazole (NEXIUM) 20 MG capsule Take 1 capsule by mouth at bedtime 02/23/23   Tower, Audrie Gallus, MD  folic acid (FOLVITE) 1 MG tablet Take 3 mg by mouth See  admin instructions. Take 3 mg by mouth once a day on Wed/Thurs/Fri/Sat    [provider]  glucose blood (ONETOUCH VERIO) test strip USE 1 STRIP TO CHECK GLUCOSE TWICE DAILY AND AS NEEDED FOR DIABETES 12/24/22   Tower, Audrie Gallus, MD  HUMIRA PEN 40 MG/0.8ML PNKT Inject 40 mg into the skin every Monday. 04/30/21   [provider]  ketorolac (ACULAR) 0.5 % ophthalmic solution Place 1 drop into the left eye 2 (two) times daily. 11/24/18   [provider]  Lancets (ONETOUCH DELICA PLUS LANCET33G) MISC USE 1 LANCET TO CHECK GLUCOSE TWICE DAILY 10/26/22   Tower, Audrie Gallus, MD  leucovorin  (WELLCOVORIN) 5 MG tablet Take 5 mg by mouth See admin instructions. Take 5 mg by mouth once a day on only Sundays and Tuesdays    [provider]  MAGNESIUM GLYCINATE PO Take 1 capsule by mouth at bedtime.    [provider]  metFORMIN (GLUCOPHAGE) 1000 MG tablet TAKE 1 TABLET BY MOUTH TWICE DAILY WITH A MEAL 02/23/23   Tower, Audrie Gallus, MD  methocarbamol (ROBAXIN) 500 MG tablet Take 1 tablet (500 mg total) by mouth every 8 (eight) hours as needed for muscle spasms (back pain). Caution of sedation 07/29/22   Tower, Audrie Gallus, MD  Methotrexate 25 MG/ML SOSY Inject 17.5 mg into the skin every Monday.    [provider]  methotrexate 50 MG/2ML injection SMARTSIG:0.9 Milliliter(s) SUB-Q Once a Week 04/30/22   [provider]  phenazopyridine (PYRIDIUM) 100 MG tablet Take 1 tablet (100 mg total) by mouth 3 (three) times daily as needed for pain. 01/20/23   Particia Nearing, PA-C  prednisoLONE acetate (PRED FORTE) 1 % ophthalmic suspension Place 4 drops into the left eye 2 (two) times daily.    [provider]  promethazine-dextromethorphan (PROMETHAZINE-DM) 6.25-15 MG/5ML syrup Take 5 mLs by mouth 4 (four) times daily as needed. 12/19/22   Particia Nearing, PA-C  rosuvastatin (CRESTOR) 5 MG tablet Take 1 tablet (5 mg total) by mouth daily. 02/26/23   Tower, Audrie Gallus, MD  Semaglutide, 1 MG/DOSE, 4 MG/3ML SOPN Inject 1 mg as directed once a week. 01/12/23   Tower, Audrie Gallus, MD  traMADol (ULTRAM) 50 MG tablet Take 1 tablet (50 mg total) by mouth every 6 (six) hours as needed for severe pain (for fibromyalgia flares and back pain). Patient not taking: Reported on 03/16/2023 07/29/22   Judy Pimple, MD      Allergies    Amlodipine, Cetirizine hcl, Erythromycin, Escitalopram oxalate, Lisinopril, Loratadine, Nifedipine, Sertraline hcl, and Vyvanse [lisdexamfetamine dimesylate]    Review of Systems   Review of Systems  Constitutional:  Negative for fever.  HENT:   Negative for sore throat.   Respiratory:  Negative for cough and shortness of breath.   Cardiovascular:  Positive for palpitations. Negative for chest pain and leg swelling.  Gastrointestinal:  Negative for abdominal pain and blood in stool.  Genitourinary:  Negative for dysuria and flank pain.  Musculoskeletal:  Negative for back pain and neck pain.  Skin:  Negative for rash.  Neurological:  Negative for syncope and headaches.    Physical Exam Updated Vital Signs BP 113/80   Pulse 91   Resp 19   Ht 1.715 m (5' 7.5")   Wt 79.4 kg   LMP 08/05/2006   SpO2 96%   BMI 27.00 kg/m  Physical Exam Vitals and nursing note reviewed.  Constitutional:      Appearance: Normal appearance.  She is well-developed.  HENT:     Head: Atraumatic.     Nose: Nose normal.     Mouth/Throat:     Mouth: Mucous membranes are moist.  Eyes:     General: No scleral icterus.    Conjunctiva/sclera: Conjunctivae normal.  Neck:     Trachea: No tracheal deviation.     Comments: Trachea midline. Thyroid not grossly enlarged or tender.  Cardiovascular:     Rate and Rhythm: Regular rhythm. Tachycardia present.     Pulses: Normal pulses.     Heart sounds: Normal heart sounds. No murmur heard.    No friction rub. No gallop.  Pulmonary:     Effort: Pulmonary effort is normal. No respiratory distress.     Breath sounds: Normal breath sounds.  Abdominal:     General: There is no distension.     Palpations: Abdomen is soft.     Tenderness: There is no abdominal tenderness.  Musculoskeletal:        General: No swelling or tenderness.     Cervical back: Normal range of motion and neck supple. No rigidity. No muscular tenderness.     Right lower leg: No edema.     Left lower leg: No edema.  Skin:    General: Skin is warm and dry.     Findings: No rash.  Neurological:     Mental Status: She is alert.     Comments: Alert, speech normal.   Psychiatric:        Mood and Affect: Mood normal.     ED Results  / Procedures / Treatments   Labs (all labs ordered are listed, but only abnormal results are displayed) Results for orders placed or performed during the hospital encounter of 03/23/23  Basic metabolic panel   Collection Time: 03/23/23  9:38 AM  Result Value Ref Range   Sodium 140 135 - 145 mmol/L   Potassium 4.5 3.5 - 5.1 mmol/L   Chloride 103 98 - 111 mmol/L   CO2 22 22 - 32 mmol/L   Glucose, Bld 148 (H) 70 - 99 mg/dL   BUN 12 6 - 20 mg/dL   Creatinine, Ser 1.61 0.44 - 1.00 mg/dL   Calcium 9.8 8.9 - 09.6 mg/dL   GFR, Estimated >04 >54 mL/min   Anion gap 15 5 - 15  CBC   Collection Time: 03/23/23  9:38 AM  Result Value Ref Range   WBC 12.7 (H) 4.0 - 10.5 K/uL   RBC 5.52 (H) 3.87 - 5.11 MIL/uL   Hemoglobin 15.8 (H) 12.0 - 15.0 g/dL   HCT 09.8 (H) 11.9 - 14.7 %   MCV 87.7 80.0 - 100.0 fL   MCH 28.6 26.0 - 34.0 pg   MCHC 32.6 30.0 - 36.0 g/dL   RDW 82.9 56.2 - 13.0 %   Platelets 370 150 - 400 K/uL   nRBC 0.0 0.0 - 0.2 %  TSH   Collection Time: 03/23/23  9:38 AM  Result Value Ref Range   TSH 5.100 (H) 0.350 - 4.500 uIU/mL    EKG EKG Interpretation Date/Time:  Tuesday March 23 2023 09:22:57 EDT Ventricular Rate:  170 PR Interval:    QRS Duration:  75 QT Interval:  284 QTC Calculation: 482 R Axis:   53  Text Interpretation: Supraventricular tachycardia Confirmed by Cathren Laine ([id number]) on 03/23/2023 9:40:31 AM  Radiology No results found.  Procedures Procedures    Medications Ordered in ED Medications  diltiazem (CARDIZEM) 1 mg/mL load via  infusion 20 mg (20 mg Intravenous Bolus from Bag 03/23/23 0956)    And  diltiazem (CARDIZEM) 125 mg in dextrose 5% 125 mL (1 mg/mL) infusion (5 mg/hr Intravenous New Bag/Given 03/23/23 0952)  adenosine (ADENOCARD) 6 MG/2ML injection 6 mg (6 mg Intravenous Given 03/23/23 0944)  lactated ringers bolus 1,000 mL (1,000 mLs Intravenous Bolus 03/23/23 0957)    ED Course/ Medical Decision Making/ A&P                                  Medical Decision Making Problems Addressed: Atrial fibrillation with RVR [facility]): acute illness or injury with systemic symptoms that poses a threat to life or bodily functions Nausea vomiting and diarrhea: acute illness or injury with systemic symptoms New onset atrial fibrillation [facility]): acute illness or injury with systemic symptoms that poses a threat to life or bodily functions  Amount and/or Complexity of Data Reviewed External Data Reviewed: notes. Labs: ordered. Decision-making details documented in ED Course. Radiology: ordered and independent interpretation performed. Decision-making details documented in ED Course. ECG/medicine tests: ordered and independent interpretation performed. Decision-making details documented in ED Course. Discussion of management or test interpretation with external provider(s): Cardiology, medicine  Risk Prescription drug management. Parenteral controlled substances. Decision regarding hospitalization.   Iv ns. Continuous pulse ox and cardiac monitoring. Labs ordered/sent. Imaging ordered.   Differential diagnosis includes svt, afib w rvr, etc. Dispo decision including potential need for admission considered - will get labs and imaging and reassess.   Reviewed nursing notes and prior charts for additional history. External reports reviewed.   Initially narrow complex tachycardia, fairly regular - no change w vagal maneuvers. Adenosine 6 mg rapid ivp, with transient av block/block in rhythm - appears underlying afib, and rvr rapidly returned, hr 170. Cardizem iv bolus/gtt. LR bolus. Cardiology consulted - discussed pt with Dr Tenny Craw - she indicates admit to medicine, echo, they will see in consult.   Cardiac monitor: narrow complex tachy, rate 170.   Labs reviewed/interpreted by me - wbc 12, hgb 15. Chem unremarkable.   CRITICAL CARE RE: new onset afib w rvr required adenosine/parenteral cardizem therapy Performed by: Suzi Roots Total critical  care time: 40 minutes Critical care time was exclusive of separately billable procedures and treating other patients. Critical care was necessary to treat or prevent imminent or life-threatening deterioration. Critical care was time spent personally by me on the following activities: development of treatment plan with patient and/or surrogate as well as nursing, discussions with consultants, evaluation of patient's response to treatment, examination of patient, obtaining history from patient or surrogate, ordering and performing treatments and interventions, ordering and review of laboratory studies, ordering and review of radiographic studies, pulse oximetry and re-evaluation of patient's condition.          Final Clinical Impression(s) / ED Diagnoses Final diagnoses:  None    Rx / DC Orders ED Discharge Orders     None           Cathren Laine, MD 03/23/23 1153

## 2023-03-23 NOTE — Telephone Encounter (Signed)
 Aware, will watch for correspondence Agree with this disposition as she likely needs fluids

## 2023-03-23 NOTE — Discharge Instructions (Signed)

## 2023-03-23 NOTE — TOC Initial Note (Signed)
 Transition of Care [facility]) - Initial/Assessment Note    Patient Details  Name: [name] MRN: [id number] Date of Birth: 04/15/1965  Transition of Care [facility]) CM/SW Contact:    Barron Alvine, RN Phone Number: 03/23/2023, 8:12 PM  Clinical Narrative:                 Transition of Care Department [facility]) has reviewed patient and no other TOC needs have been identified at this time. We will continue to monitor patient advancement through interdisciplinary progression rounds. If new patient transition needs arise, please place a TOC consult.      Patient Goals and CMS Choice          Expected Discharge Plan and Services                                              Prior Living Arrangements/Services                       Activities of Daily Living   ADL Screening (condition at time of admission) Independently performs ADLs?: Yes (appropriate for developmental age) Is the patient deaf or have difficulty hearing?: No Does the patient have difficulty seeing, even when wearing glasses/contacts?: No Does the patient have difficulty concentrating, remembering, or making decisions?: No  Permission Sought/Granted                  Emotional Assessment              Admission diagnosis:  New onset atrial fibrillation (HCC) [I48.91] Atrial fibrillation with RVR (HCC) [I48.91] Nausea vomiting and diarrhea [R11.2, R19.7] Patient Active Problem List   Diagnosis Date Noted   Atrial fibrillation with RVR (HCC) 03/23/2023   Conductive hearing loss in left ear 02/02/2023   Left chronic serous otitis media 02/02/2023   Hypertrophy of nasal turbinates 02/02/2023   Other specified disorders of eustachian tube, left ear 02/02/2023   Hearing loss 11/30/2022   Current use of proton pump inhibitor 09/20/2022   Piriformis muscle pain 07/29/2022   Aortic atherosclerosis (HCC) 03/25/2022   Fractures, stress 12/26/2021   Immunosuppressed status (HCC) 11/19/2021    Encounter for screening mammogram for breast cancer 09/24/2021   Osteoporosis 09/24/2021   Estrogen deficiency 09/01/2021   History of DVT (deep vein thrombosis) 07/11/2021   Sleep apnea 05/14/2021   Hyperlipidemia associated with type 2 diabetes mellitus (HCC) 07/10/2019   Cystoid macular edema of both eyes 06/16/2019   Vitamin D deficiency 06/16/2019   Diabetes mellitus treated with injections of non-insulin medication (HCC) 06/06/2019   Peripheral edema 10/30/2017   Chronic rhinitis 08/10/2017   Welcome to Medicare preventive visit 04/20/2017   Chronic low back pain 12/09/2015   Degeneration of lumbar or lumbosacral intervertebral disc 12/09/2015   Hypothyroid 01/24/2014   Pseudophakia of both eyes 12/15/2013   Deviated nasal septum 01/03/2013   Encounter for Medicare annual wellness exam 11/02/2012   Hydradenitis 11/02/2012   Cystoid macular degeneration of retina 05/13/2012   Macular hole 03/23/2012   Screening-pulmonary TB 03/23/2012   Routine general medical examination at a health care facility 10/02/2011   Iritis, recurrent 10/02/2011   Tachycardia 10/02/2011   ADD (attention deficit disorder) 08/20/2010   DYSHIDROTIC ECZEMA, HANDS 11/12/2009   Obstructive sleep apnea 11/19/2006   Depression with anxiety 09/29/2006   Essential hypertension 09/29/2006  Seasonal and perennial allergic rhinitis 09/29/2006   Asthma, mild intermittent, well-controlled 09/29/2006   GERD 09/29/2006   IBS 09/29/2006   ENDOMETRIOSIS 09/29/2006   FEMALE INFERTILITY 09/29/2006   Fibromyalgia 09/29/2006   INSOMNIA 09/29/2006   PCP:  Judy Pimple, MD Pharmacy:   [facility] [address] [place] - [address] [place] [address] [place] [address] [place] [address] Phone: [phone] Fax: [phone]     Social Drivers of Health (SDOH) Social History: SDOH Screenings   Food Insecurity: No Food Insecurity (03/23/2023)  Housing: Low Risk  (03/23/2023)  Transportation Needs: No  Transportation Needs (03/23/2023)  Utilities: Not At Risk (03/23/2023)  Alcohol Screen: Low Risk  (08/06/2022)  Depression (PHQ2-9): Low Risk  (11/30/2022)  Financial Resource Strain: Low Risk  (11/29/2022)  Physical Activity: Unknown (11/29/2022)  Social Connections: Moderately Integrated (11/29/2022)  Stress: No Stress Concern Present (11/29/2022)  Tobacco Use: Low Risk  (03/23/2023)  Health Literacy: Adequate Health Literacy (08/06/2022)   SDOH Interventions:     Readmission Risk Interventions     No data to display

## 2023-03-24 ENCOUNTER — Observation Stay (HOSPITAL_BASED_OUTPATIENT_CLINIC_OR_DEPARTMENT_OTHER)

## 2023-03-24 ENCOUNTER — Other Ambulatory Visit (HOSPITAL_COMMUNITY): Payer: Self-pay | Admitting: *Deleted

## 2023-03-24 DIAGNOSIS — I4891 Unspecified atrial fibrillation: Secondary | ICD-10-CM | POA: Diagnosis not present

## 2023-03-24 DIAGNOSIS — R197 Diarrhea, unspecified: Secondary | ICD-10-CM | POA: Diagnosis not present

## 2023-03-24 DIAGNOSIS — R112 Nausea with vomiting, unspecified: Secondary | ICD-10-CM | POA: Diagnosis not present

## 2023-03-24 LAB — CBC
HCT: 44 % (ref 36.0–46.0)
Hemoglobin: 13.9 g/dL (ref 12.0–15.0)
MCH: 27.7 pg (ref 26.0–34.0)
MCHC: 31.6 g/dL (ref 30.0–36.0)
MCV: 87.8 fL (ref 80.0–100.0)
Platelets: 303 10*3/uL (ref 150–400)
RBC: 5.01 MIL/uL (ref 3.87–5.11)
RDW: 14.1 % (ref 11.5–15.5)
WBC: 11.3 10*3/uL — ABNORMAL HIGH (ref 4.0–10.5)
nRBC: 0 % (ref 0.0–0.2)

## 2023-03-24 LAB — BASIC METABOLIC PANEL
Anion gap: 9 (ref 5–15)
BUN: 12 mg/dL (ref 6–20)
CO2: 23 mmol/L (ref 22–32)
Calcium: 9 mg/dL (ref 8.9–10.3)
Chloride: 106 mmol/L (ref 98–111)
Creatinine, Ser: 0.81 mg/dL (ref 0.44–1.00)
GFR, Estimated: >60 mL/min (ref >60)
Glucose, Bld: 129 mg/dL — ABNORMAL HIGH (ref 70–99)
Potassium: 3.8 mmol/L (ref 3.5–5.1)
Sodium: 138 mmol/L (ref 135–145)

## 2023-03-24 LAB — GLUCOSE, CAPILLARY
Glucose-Capillary: 135 mg/dL — ABNORMAL HIGH (ref 70–99)
Glucose-Capillary: 134 mg/dL — ABNORMAL HIGH (ref 70–99)
Glucose-Capillary: 135 mg/dL — ABNORMAL HIGH (ref 70–99)

## 2023-03-24 LAB — ECHOCARDIOGRAM COMPLETE
Area-P 1/2: 4.68 cm2
Height: 67 in
S' Lateral: 2.3 cm
Weight: 2839.52 [oz_av]

## 2023-03-24 LAB — HIV ANTIBODY (ROUTINE TESTING W REFLEX): HIV Screen 4th Generation wRfx: NONREACTIVE

## 2023-03-24 MED ORDER — KETOROLAC TROMETHAMINE 0.5 % OP SOLN
1.0000 [drp] | Freq: Four times a day (QID) | OPHTHALMIC | Status: DC
  Administered 2023-03-24 (×2): 1 [drp] via OPHTHALMIC
  Filled 2023-03-24: qty 3

## 2023-03-24 MED ORDER — METOPROLOL TARTRATE 50 MG PO TABS: 50.0000 mg | ORAL_TABLET | Freq: Two times a day (BID) | ORAL | Status: DC

## 2023-03-24 MED ORDER — METOPROLOL TARTRATE 50 MG PO TABS: 50.0000 mg | ORAL_TABLET | Freq: Two times a day (BID) | ORAL | 2 refills | Status: DC

## 2023-03-24 MED ORDER — APIXABAN 5 MG PO TABS: 5.0000 mg | ORAL_TABLET | Freq: Two times a day (BID) | ORAL | 2 refills | Status: DC

## 2023-03-24 NOTE — Progress Notes (Signed)
   Progress Note  Patient Name: [name] Date of Encounter: 03/24/2023  Primary Cardiologist: Dietrich Pates, MD  Interval Summary   Chart reviewed.  Patient remains in atrial fibrillation with improved heart rate.  She reports no palpitations or chest pain at rest this morning.  Remains on intravenous diltiazem 5 mg/h.  Vital Signs    Vitals:   03/24/23 0530 03/24/23 0600 03/24/23 0811 03/24/23 0844  BP:  124/81    Pulse:  80    Resp:  14    Temp: 98.6 F (37 C)  98.4 F (36.9 C)   TempSrc:   Oral   SpO2:  95%  97%  Weight:      Height:        Intake/Output Summary (Last 24 hours) at 03/24/2023 0905 Last data filed at 03/24/2023 0811 Gross per 24 hour  Intake 99.95 ml  Output 1600 ml  Net -1500.05 ml   Filed Weights   03/23/23 0917 03/23/23 1235 03/24/23 0500  Weight: 79.4 kg 81 kg 80.5 kg    Physical Exam   GEN: No acute distress.   Neck: No JVD. Cardiac: Irregular irregular without gallop.  Respiratory: Nonlabored. Clear to auscultation bilaterally. GI: Soft, nontender, bowel sounds present. MS: No edema. Neuro:  Nonfocal. Psych: Alert and oriented x 3. Normal affect.  ECG/Telemetry    Telemetry reviewed showing atrial fibrillation.  Labs    Chemistry Recent Labs  Lab 03/23/23 0938 03/24/23 0529  NA 140 138  K 4.5 3.8  CL 103 106  CO2 22 23  GLUCOSE 148* 129*  BUN 12 12  CREATININE 0.85 0.81  CALCIUM 9.8 9.0  GFRNONAA >60 >60  ANIONGAP 15 9    Hematology Recent Labs  Lab 03/23/23 0938 03/24/23 0529  WBC 12.7* 11.3*  RBC 5.52* 5.01  HGB 15.8* 13.9  HCT 48.4* 44.0  MCV 87.7 87.8  MCH 28.6 27.7  MCHC 32.6 31.6  RDW 14.2 14.1  PLT 370 303    Cardiac Studies   Echocardiogram pending.  Assessment & Plan   1.  Newly documented atrial fibrillation presenting with RVR, CHA2DS2-VASc score is 3.  Patient presented clinically with question of viral illness (nausea, vomiting, diarrhea).  Viral respiratory panel negative.   Symptomatically improved this morning with better heart rate control.  2.  OSA on CPAP.  3.  Primary hypertension.  4.  Type 2 diabetes mellitus.  5.  Mixed hyperlipidemia.  Discussed with patient and family member.  Plan to discontinue intravenous diltiazem, increase Lopressor to 50 mg twice daily, and obtain echocardiogram.  Continue Eliquis 5 mg twice daily.  Unless there are significant cardiac structural abnormalities that require further workup, potentially can be discharged later in the afternoon if her heart rate remains controlled.  In that case she would need to have follow-up to determine if she spontaneously converts or whether an outpatient cardioversion needs to be arranged.  For questions or updates, please contact [facility] Please consult [email or web address] for contact info under   Signed, Nona Dell, MD  03/24/2023, 9:05 AM

## 2023-03-24 NOTE — Discharge Summary (Signed)
 Physician Discharge Summary   Patient: [name] MRN: [id number] DOB: 04-29-1965  Admit date:     03/23/2023  Discharge date: 03/24/23  Discharge Physician: Vassie Loll   PCP: Judy Pimple, MD   Recommendations at discharge:  Repeat basic metabolic panel to follow electrolytes and renal function Reassess blood pressure and adjust antihypertensive treatment as needed. Continue to follow CBG fluctuation and further adjust hypoglycemic regimen as required.  Discharge Diagnoses: Principal Problem:   Atrial fibrillation with RVR (HCC) Hypertension Type 2 diabetes Obstructive sleep apnea Fibromyalgia GERD/GI prophylaxis Hyperlipidemia Depression/anxiety COPD/asthma Nausea/vomiting and diarrhea.  Brief Hospital admission narrative:  [name] is a 58 y.o. female with medical history significant of hypertension, type 2 diabetes mellitus, fibromyalgia, GERD, hypothyroidism and history of obstructive sleep apnea; who presented to the hospital secondary to palpitations.  Patient reports symptoms presented suddenly after she woke up this morning with associated symptoms of nausea/vomiting and diarrhea; and subsequent perception of feeling her heart racing.  Patient reports never experiencing similar symptoms before.   Patient denies any fever, shortness of breath, focal neurologic deficit, dysuria, hematuria, melena, hematochezia, abdominal pain or any other complaints.     Of note, patient daughter was sick with the flu last week but tested negative during nasal swab.   In the ED patient received adenosine without suppression and was started on Cardizem drip.  Cardiology service consulted and [facility] contacted to place patient in the hospital for further evaluation and management.  Assessment and Plan: 1-atrial fibrillation with RVR -New diagnosis -TSH within normal limits -2D echo demonstrating preserved ejection fraction, no wall motion abnormalities and no significant  valvular disorder -Following cardiology service recommendation patient has been discharged home on metoprolol 50 mg twice a day and the use of Eliquis for secondary prevention -Patient will follow-up in the next 3 weeks or so for further evaluation and decision at that time regarding cardioversion. -CHA2DS2-VASc score of 3  2-hypertension -Stable and well-controlled -Continue the use of home antihypertensive agents except for transition of carvedilol to metoprolol at discharge. -Heart healthy/low-sodium diet discussed with patient.  3-type 2 diabetes mellitus -A1c 7.0 -Resume home hypoglycemic regimen and continue to follow-up with PCP for further medication dose adjustment.  4-gastroesophageal reflux disease/GI prophylaxis -Continue PPI.  5-obstructive sleep apnea -Continue CPAP nightly.  6-fibromyalgia -Continue home analgesic therapy.  7-hyperlipidemia  -continue statin  8-nausea/vomiting and diarrhea -Isolated episode and completely resolved at time of discharge -Presumed associated with food poisoning. -Patient advised to maintain adequate hydration.  9-depression/anxiety -No suicidal ideation appreciated -Continue the use of Wellbutrin.  10-COPD/asthma -No acute exacerbation currently appreciated -Resume home bronchodilator management.  Consultants: Cardiology service Procedures performed: See below for x-ray report; 2D echo. Disposition: Home Diet recommendation: Heart healthy/low-sodium diet.  DISCHARGE MEDICATION: Allergies as of 03/24/2023       Reactions   Amlodipine    Pedal edema    Cetirizine Hcl Other (See Comments)   Not effective   Erythromycin Nausea Only, Other (See Comments)   Caused a stomach ache   Escitalopram Oxalate Other (See Comments)   "No improvement"   Lisinopril Cough   Loratadine Other (See Comments)   "Not effective"   Nifedipine Other (See Comments)   "Fatigue and tremors"   Sertraline Hcl Other (See Comments)   "Did not  help"   Vyvanse [lisdexamfetamine Dimesylate]    Headache and excitablility        Medication List     STOP taking these medications  carvedilol 6.25 MG tablet Commonly known as: COREG       TAKE these medications    albuterol (2.5 MG/3ML) 0.083% nebulizer solution Commonly known as: PROVENTIL Take 3 mLs (2.5 mg total) by nebulization every 4 (four) hours as needed for wheezing or shortness of breath.   albuterol 108 (90 Base) MCG/ACT inhaler Commonly known as: VENTOLIN HFA INHALE 2 PUFFS BY MOUTH EVERY 6 HOURS AS NEEDED FOR WHEEZING OR SHORTNESS OF BREATH   amphetamine-dextroamphetamine 20 MG tablet Commonly known as: ADDERALL Take 1 tablet (20 mg total) by mouth 2 (two) times daily.   apixaban 5 MG Tabs tablet Commonly known as: ELIQUIS Take 1 tablet (5 mg total) by mouth 2 (two) times daily.   budesonide-formoterol 160-4.5 MCG/ACT inhaler Commonly known as: Symbicort Inhale 2 puffs into the lungs 2 (two) times daily as needed.   buPROPion 300 MG 24 hr tablet Commonly known as: WELLBUTRIN XL Take 1 tablet by mouth once daily   CALCIUM-VITAMIN D PO Take 1 capsule by mouth in the morning and at bedtime.   cetirizine 10 MG tablet Commonly known as: ZYRTEC Take 10 mg by mouth daily as needed for allergies or rhinitis.   chlorthalidone 25 MG tablet Commonly known as: HYGROTON Take 1 tablet by mouth once daily   esomeprazole 20 MG capsule Commonly known as: NEXIUM Take 1 capsule by mouth at bedtime   folic acid 1 MG tablet Commonly known as: FOLVITE Take 3 mg by mouth See admin instructions. Take 3 mg by mouth once a day on Wed/Thurs/Fri/Sat   Humira Pen 40 MG/0.8ML Pnkt Generic drug: Adalimumab Inject 40 mg into the skin every Monday.   ketorolac 0.5 % ophthalmic solution Commonly known as: ACULAR Place 1 drop into the left eye 2 (two) times daily.   leucovorin 5 MG tablet Commonly known as: WELLCOVORIN Take 5 mg by mouth See admin instructions.  Take 5 mg by mouth once a day on only Sundays and Tuesdays   MAGNESIUM GLYCINATE PO Take 1 capsule by mouth at bedtime.   metFORMIN 1000 MG tablet Commonly known as: GLUCOPHAGE TAKE 1 TABLET BY MOUTH TWICE DAILY WITH A MEAL   methocarbamol 500 MG tablet Commonly known as: ROBAXIN Take 1 tablet (500 mg total) by mouth every 8 (eight) hours as needed for muscle spasms (back pain). Caution of sedation   Methotrexate 25 MG/ML Sosy Inject 17.5 mg into the skin every Monday.   metoprolol tartrate 50 MG tablet Commonly known as: LOPRESSOR Take 1 tablet (50 mg total) by mouth 2 (two) times daily.   prednisoLONE acetate 1 % ophthalmic suspension Commonly known as: PRED FORTE Place 4 drops into the left eye 2 (two) times daily.   rosuvastatin 5 MG tablet Commonly known as: Crestor Take 1 tablet (5 mg total) by mouth daily.   Semaglutide (1 MG/DOSE) 4 MG/3ML Sopn Inject 1 mg as directed once a week.   traMADol 50 MG tablet Commonly known as: ULTRAM Take 1 tablet (50 mg total) by mouth every 6 (six) hours as needed for severe pain (for fibromyalgia flares and back pain).        Follow-up Information     Ellsworth Lennox, PA-C Follow up on 04/16/2023.   Specialties: Cardiology, Cardiology Why: Cardiology Hospital Follow-up on 04/16/2023 at 2:30 PM. Contact information: [address] [place] [address] [phone]                Discharge Exam: Ceasar Mons Weights   03/23/23 [phone]  03/23/23 1235 03/24/23 0500  Weight: 79.4 kg 81 kg 80.5 kg   General exam: Alert, awake, oriented x 3 Respiratory system: Clear to auscultation. Respiratory effort normal. Cardiovascular system:RRR. No murmurs, rubs, gallops. Gastrointestinal system: Abdomen is nondistended, soft and nontender. No organomegaly or masses felt. Normal bowel sounds heard. Central nervous system: Alert and oriented. No focal neurological deficits. Extremities: No C/C/E, +pedal pulses Skin: No rashes, lesions or  ulcers Psychiatry: Judgement and insight appear normal. Mood & affect appropriate.    Condition at discharge: Stable and improved.  The results of significant diagnostics from this hospitalization (including imaging, microbiology, ancillary and laboratory) are listed below for reference.   Imaging Studies: ECHOCARDIOGRAM COMPLETE Result Date: 03/24/2023    ECHOCARDIOGRAM REPORT   Patient Name:   [name] [facility] Date of Exam: 03/24/2023 Medical Rec #:  [id number]       Height:       67.0 in Accession #:    [id number]      Weight:       177.5 lb Date of Birth:  [birth year]       BSA:          1.922 m Patient Age:    57 years        BP:           136/95 mmHg Patient Gender: F               HR:           90 bpm. [facility] Procedure: 2D Echo, Cardiac Doppler and Color Doppler (Both Spectral and Color            Flow Doppler were utilized during procedure). Indications:    Atrial Fibrillation I48.91  History:        Patient has no prior history of Echocardiogram examinations.                 Arrythmias:Atrial Fibrillation; Risk Factors:Hypertension,                 Diabetes and Dyslipidemia.  Sonographer:    Celesta Gentile RCS Referring Phys: [phone] SAMUEL G MCDOWELL IMPRESSIONS  1. Left ventricular ejection fraction, by estimation, is 60 to 65%. The left ventricle has normal function. The left ventricle has no regional wall motion abnormalities. There is mild concentric left ventricular hypertrophy. Left ventricular diastolic parameters are indeterminate.  2. Right ventricular systolic function is normal. The right ventricular size is normal. There is normal pulmonary artery systolic pressure. The estimated right ventricular systolic pressure is 17.6 mmHg.  3. The mitral valve is grossly normal. No evidence of mitral valve regurgitation.  4. The aortic valve is tricuspid. Aortic valve regurgitation is not visualized.  5. The inferior vena cava is normal in size with greater than 50% respiratory  variability, suggesting right atrial pressure of 3 mmHg. Comparison(s): No prior Echocardiogram. FINDINGS  Left Ventricle: Left ventricular ejection fraction, by estimation, is 60 to 65%. The left ventricle has normal function. The left ventricle has no regional wall motion abnormalities. The left ventricular internal cavity size was normal in size. There is  mild concentric left ventricular hypertrophy. Left ventricular diastolic function could not be evaluated due to atrial fibrillation. Left ventricular diastolic parameters are indeterminate. Right Ventricle: The right ventricular size is normal. No increase in right ventricular wall thickness. Right ventricular systolic function is normal. There is normal pulmonary artery systolic pressure. The tricuspid regurgitant velocity is 1.91 m/s, and  with  an assumed right atrial pressure of 3 mmHg, the estimated right ventricular systolic pressure is 17.6 mmHg. Left Atrium: Left atrial size was normal in size. Right Atrium: Right atrial size was normal in size. Pericardium: There is no evidence of pericardial effusion. Presence of epicardial fat layer. Mitral Valve: The mitral valve is grossly normal. No evidence of mitral valve regurgitation. Tricuspid Valve: The tricuspid valve is grossly normal. Tricuspid valve regurgitation is trivial. Aortic Valve: The aortic valve is tricuspid. There is mild aortic valve annular calcification. Aortic valve regurgitation is not visualized. Pulmonic Valve: The pulmonic valve was grossly normal. Pulmonic valve regurgitation is trivial. Aorta: The aortic root is normal in size and structure. Venous: The inferior vena cava is normal in size with greater than 50% respiratory variability, suggesting right atrial pressure of 3 mmHg. IAS/Shunts: No atrial level shunt detected by color flow Doppler. Additional Comments: 3D was performed not requiring image post processing on an independent workstation and was indeterminate.  LEFT VENTRICLE  PLAX 2D LVIDd:         3.40 cm LVIDs:         2.30 cm LV PW:         1.00 cm LV IVS:        1.10 cm LVOT diam:     1.80 cm LV SV:         50 LV SV Index:   26 LVOT Area:     2.54 cm  RIGHT VENTRICLE TAPSE (M-mode): 2.1 cm LEFT ATRIUM             Index        RIGHT ATRIUM           Index LA diam:        3.60 cm 1.87 cm/m   RA Area:     15.60 cm LA Vol (A2C):   36.4 ml 18.94 ml/m  RA Volume:   34.40 ml  17.90 ml/m LA Vol (A4C):   38.8 ml 20.19 ml/m LA Biplane Vol: 37.6 ml 19.56 ml/m  AORTIC VALVE LVOT Vmax:   107.60 cm/s LVOT Vmean:  77.500 cm/s LVOT VTI:    0.195 m  AORTA Ao Root diam: 3.20 cm MITRAL VALVE               TRICUSPID VALVE MV Area (PHT): 4.68 cm    TR Peak grad:   14.6 mmHg MV Decel Time: 162 msec    TR Vmax:        191.00 cm/s MV E velocity: 97.70 cm/s                            SHUNTS                            Systemic VTI:  0.20 m                            Systemic Diam: 1.80 cm Nona Dell MD Electronically signed by Nona Dell MD Signature Date/Time: 03/24/2023/5:00:32 PM    Final    DG Chest Port 1 View Result Date: 03/23/2023 CLINICAL DATA:  Atrial fibrillation, palpitations. EXAM: PORTABLE CHEST 1 VIEW COMPARISON:  March 25, 2022. FINDINGS: The heart size and mediastinal contours are within normal limits. Both lungs are clear. The visualized skeletal structures are unremarkable. IMPRESSION: No active disease. Electronically Signed  By: Lupita Raider M.D.   On: 03/23/2023 11:46    Microbiology: Results for orders placed or performed during the hospital encounter of 03/23/23  Resp panel by RT-PCR (RSV, Flu A&B, Covid) Anterior Nasal Swab     Status: None   Collection Time: 03/23/23 11:00 AM   Specimen: Anterior Nasal Swab  Result Value Ref Range Status   SARS Coronavirus 2 by RT PCR NEGATIVE NEGATIVE Final    Comment: (NOTE) SARS-CoV-2 target nucleic acids are NOT DETECTED.  The SARS-CoV-2 RNA is generally detectable in upper respiratory specimens during the  acute phase of infection. The lowest concentration of SARS-CoV-2 viral copies this assay can detect is 138 copies/mL. A negative result does not preclude SARS-Cov-2 infection and should not be used as the sole basis for treatment or other patient management decisions. A negative result may occur with  improper specimen collection/handling, submission of specimen other than nasopharyngeal swab, presence of viral mutation(s) within the areas targeted by this assay, and inadequate number of viral copies(<138 copies/mL). A negative result must be combined with clinical observations, patient history, and epidemiological information. The expected result is Negative.  Fact Sheet for Patients:  BloggerCourse.com  Fact Sheet for Healthcare Providers:  SeriousBroker.it  This test is no t yet approved or cleared by the [place] FDA and  has been authorized for detection and/or diagnosis of SARS-CoV-2 by FDA under an Emergency Use Authorization (EUA). This EUA will remain  in effect (meaning this test can be used) for the duration of the COVID-19 declaration under Section 564(b)(1) of the Act, 21 U.S.C.section 360bbb-3(b)(1), unless the authorization is terminated  or revoked sooner.       Influenza A by PCR NEGATIVE NEGATIVE Final   Influenza B by PCR NEGATIVE NEGATIVE Final    Comment: (NOTE) The Xpert Xpress SARS-CoV-2/FLU/RSV plus assay is intended as an aid in the diagnosis of influenza from Nasopharyngeal swab specimens and should not be used as a sole basis for treatment. Nasal washings and aspirates are unacceptable for Xpert Xpress SARS-CoV-2/FLU/RSV testing.  Fact Sheet for Patients: BloggerCourse.com  Fact Sheet for Healthcare Providers: SeriousBroker.it  This test is not yet approved or cleared by the [place] FDA and has been authorized for detection and/or  diagnosis of SARS-CoV-2 by FDA under an Emergency Use Authorization (EUA). This EUA will remain in effect (meaning this test can be used) for the duration of the COVID-19 declaration under Section 564(b)(1) of the Act, 21 U.S.C. section 360bbb-3(b)(1), unless the authorization is terminated or revoked.     Resp Syncytial Virus by PCR NEGATIVE NEGATIVE Final    Comment: (NOTE) Fact Sheet for Patients: BloggerCourse.com  Fact Sheet for Healthcare Providers: SeriousBroker.it  This test is not yet approved or cleared by the [place] FDA and has been authorized for detection and/or diagnosis of SARS-CoV-2 by FDA under an Emergency Use Authorization (EUA). This EUA will remain in effect (meaning this test can be used) for the duration of the COVID-19 declaration under Section 564(b)(1) of the Act, 21 U.S.C. section 360bbb-3(b)(1), unless the authorization is terminated or revoked.  Performed at [facility], [address], [place], [place] [address]   MRSA Next Gen by PCR, Nasal     Status: None   Collection Time: 03/23/23 12:37 PM   Specimen: Nasal Mucosa; Nasal Swab  Result Value Ref Range Status   MRSA by PCR Next Gen NOT DETECTED NOT DETECTED Final    Comment: (NOTE) The GeneXpert MRSA Assay (FDA  approved for NASAL specimens only), is one component of a comprehensive MRSA colonization surveillance program. It is not intended to diagnose MRSA infection nor to guide or monitor treatment for MRSA infections. Test performance is not FDA approved in patients less than 2 years old. Performed at [facility], [address] [place], [place] [address]     Labs: CBC: Recent Labs  Lab 03/23/23 [phone] 03/24/23 0529  WBC 12.7* 11.3*  HGB 15.8* 13.9  HCT 48.4* 44.0  MCV 87.7 87.8  PLT 370 303   Basic Metabolic Panel: Recent Labs  Lab 03/23/23 0938 03/24/23 0529  NA 140 138  K 4.5 3.8  CL 103 106  CO2 22 23  GLUCOSE  148* 129*  BUN 12 12  CREATININE 0.85 0.81  CALCIUM 9.8 9.0  MG 2.0  --    CBG: Recent Labs  Lab 03/23/23 1551 03/23/23 2127 03/24/23 0743 03/24/23 1154 03/24/23 1600  GLUCAP 101* 131* 135* 134* 135*    Discharge time spent: greater than 30 minutes.  Signed: Vassie Loll, MD [facility] 03/24/2023

## 2023-03-24 NOTE — Progress Notes (Signed)
*  PRELIMINARY RESULTS* Echocardiogram 2D Echocardiogram has been performed.  Stacey Drain 03/24/2023, 4:43 PM

## 2023-03-31 ENCOUNTER — Ambulatory Visit: Admitting: Family Medicine

## 2023-03-31 ENCOUNTER — Encounter: Payer: Self-pay | Admitting: Family Medicine

## 2023-03-31 ENCOUNTER — Ambulatory Visit (INDEPENDENT_AMBULATORY_CARE_PROVIDER_SITE_OTHER)
Admission: RE | Admit: 2023-03-31 | Discharge: 2023-03-31 | Disposition: A | Discharge status: Home / Self Care | Source: Ambulatory Visit | Attending: Family Medicine | Admitting: Family Medicine

## 2023-03-31 VITALS — BP 149/94 | HR 93 | Temp 100.2°F | Ht 67.0 in | Wt 174.1 lb

## 2023-03-31 DIAGNOSIS — I10 Essential (primary) hypertension: Secondary | ICD-10-CM

## 2023-03-31 DIAGNOSIS — R051 Acute cough: Secondary | ICD-10-CM

## 2023-03-31 DIAGNOSIS — I4891 Unspecified atrial fibrillation: Secondary | ICD-10-CM

## 2023-03-31 DIAGNOSIS — R059 Cough, unspecified: Secondary | ICD-10-CM

## 2023-03-31 DIAGNOSIS — R509 Fever, unspecified: Secondary | ICD-10-CM | POA: Insufficient documentation

## 2023-03-31 LAB — POCT INFLUENZA A/B
Influenza A, POC: NEGATIVE
Influenza B, POC: NEGATIVE

## 2023-03-31 LAB — POC COVID19 BINAXNOW: SARS Coronavirus 2 Ag: NEGATIVE

## 2023-03-31 MED ORDER — BENZONATATE 200 MG PO CAPS: 200.0000 mg | ORAL_CAPSULE | Freq: Three times a day (TID) | ORAL | 1 refills | Status: DC | PRN

## 2023-03-31 NOTE — Patient Instructions (Addendum)
 Chest xray now  Keep treating your symptoms  Try the tessalon for cough  We will reach out with results   If suddenly worse or short of breath- go to the hospital    Blood pressure and pulse are up today-possibly from your virus Maia Plan   Keep watching your pulse at home    Follow up with cardiology as planned  Continue current medicines

## 2023-03-31 NOTE — Assessment & Plan Note (Signed)
 Negative covid and flu tests  Cxr clear Reassuring exam   Suspect viral uri  Disc symptomatic care - see instructions on AVS Tessalon pearles sent to pharmacy   Update if not starting to improve in a week or if worsening  Call back and Er precautions noted in detail today

## 2023-03-31 NOTE — Assessment & Plan Note (Signed)
 Reviewed hospital records, lab results and studies in detail  On exam today rhythm is regular  Mildly high pulse but pt also has a fever and elevated blood pressure  Continues eliquis 5 mg bid Metoprolol 50 mg bid   Will need to re eval blood pressure when not febrile and determine if BB needs to be increased  Has follow up with cardiology planned on 4/11  Call back and Er precautions noted in detail today

## 2023-03-31 NOTE — Progress Notes (Signed)
 Subjective:    Patient ID: [name], female    DOB: 12/05/1965, 58 y.o.   MRN: [id number]  HPI  Wt Readings from Last 3 Encounters:  03/31/23 174 lb 2 oz (79 kg)  03/24/23 177 lb 7.5 oz (80.5 kg)  03/16/23 175 lb (79.4 kg)   27.27 kg/m  Vitals:   03/31/23 1141 03/31/23 1212  BP: (!) 156/92 (!) 149/94  Pulse: 93   Temp: 100.2 F (37.9 C)   SpO2: 96%    Pt presents for c/o  Uri symptoms and fever  Follow up from hospital   Symptoms started Sunday  Temp as high as 101.9  Nausea  -no vomiting  Headache Coughing started today -non productive  Sounds junky  No wheeze  No shortness of breath  Not a lot of nasal symptoms  Ears are popping  Throat is scratchy from cough    Covid and flu neg today   Over the counter Tylenol Ny quil last night     Hosp from 3/18 to 3/19 for a fib with RVR Woke up with symptoms the am of admission  Had n/v/d as well    From disch summary  Assessment and Plan: 1-atrial fibrillation with RVR -New diagnosis -TSH within normal limits -2D echo demonstrating preserved ejection fraction, no wall motion abnormalities and no significant valvular disorder -Following cardiology service recommendation patient has been discharged home on metoprolol 50 mg twice a day and the use of Eliquis for secondary prevention -Patient will follow-up in the next 3 weeks or so for further evaluation and decision at that time regarding cardioversion. -CHA2DS2-VASc score of 3   2-hypertension -Stable and well-controlled -Continue the use of home antihypertensive agents except for transition of carvedilol to metoprolol at discharge. -Heart healthy/low-sodium diet discussed with patient.   3-type 2 diabetes mellitus -A1c 7.0 -Resume home hypoglycemic regimen and continue to follow-up with PCP for further medication dose adjustment.   4-gastroesophageal reflux disease/GI prophylaxis -Continue PPI.   5-obstructive sleep apnea -Continue CPAP  nightly.   6-fibromyalgia -Continue home analgesic therapy.   7-hyperlipidemia  -continue statin   8-nausea/vomiting and diarrhea -Isolated episode and completely resolved at time of discharge -Presumed associated with food poisoning. -Patient advised to maintain adequate hydration.   9-depression/anxiety -No suicidal ideation appreciated -Continue the use of Wellbutrin.   10-COPD/asthma -No acute exacerbation currently appreciated -Resume home bronchodilator management.   Has appointment with cardiology on 4/11   Watching HR at home  Doing well  No palpitations  Avg 90-95   Cxr today    EXAM: CHEST - 2 VIEW   COMPARISON:  03/23/2023 and 03/25/2022   FINDINGS: Both lungs are clear. Heart and mediastinum are within normal limits. Trachea is midline. No pleural effusions. Curvature in the lower thoracic spine. No acute bone abnormality.   IMPRESSION: No active cardiopulmonary disease.   Patient Active Problem List   Diagnosis Date Noted   Cough with fever 03/31/2023   Atrial fibrillation with RVR (HCC) 03/23/2023   Conductive hearing loss in left ear 02/02/2023   Left chronic serous otitis media 02/02/2023   Hypertrophy of nasal turbinates 02/02/2023   Other specified disorders of eustachian tube, left ear 02/02/2023   Hearing loss 11/30/2022   Current use of proton pump inhibitor 09/20/2022   Piriformis muscle pain 07/29/2022   Aortic atherosclerosis (HCC) 03/25/2022   Fractures, stress 12/26/2021   Immunosuppressed status (HCC) 11/19/2021   Encounter for screening mammogram for breast cancer 09/24/2021   Osteoporosis  09/24/2021   Estrogen deficiency 09/01/2021   History of DVT (deep vein thrombosis) 07/11/2021   Sleep apnea 05/14/2021   Hyperlipidemia associated with type 2 diabetes mellitus (HCC) 07/10/2019   Cystoid macular edema of both eyes 06/16/2019   Vitamin D deficiency 06/16/2019   Diabetes mellitus treated with injections of non-insulin  medication (HCC) 06/06/2019   Peripheral edema 10/30/2017   Chronic rhinitis 08/10/2017   Welcome to Medicare preventive visit 04/20/2017   Chronic low back pain 12/09/2015   Degeneration of lumbar or lumbosacral intervertebral disc 12/09/2015   Hypothyroid 01/24/2014   Pseudophakia of both eyes 12/15/2013   Deviated nasal septum 01/03/2013   Encounter for Medicare annual wellness exam 11/02/2012   Hydradenitis 11/02/2012   Cystoid macular degeneration of retina 05/13/2012   Macular hole 03/23/2012   Screening-pulmonary TB 03/23/2012   Routine general medical examination at a health care facility 10/02/2011   Iritis, recurrent 10/02/2011   Tachycardia 10/02/2011   ADD (attention deficit disorder) 08/20/2010   DYSHIDROTIC ECZEMA, HANDS 11/12/2009   Obstructive sleep apnea 11/19/2006   Depression with anxiety 09/29/2006   Essential hypertension 09/29/2006   Seasonal and perennial allergic rhinitis 09/29/2006   Asthma, mild intermittent, well-controlled 09/29/2006   GERD 09/29/2006   IBS 09/29/2006   ENDOMETRIOSIS 09/29/2006   FEMALE INFERTILITY 09/29/2006   Fibromyalgia 09/29/2006   INSOMNIA 09/29/2006   Past Medical History:  Diagnosis Date   ADHD (attention deficit hyperactivity disorder)    ADD - no meds   Allergy    allergic rhinitis   Anxiety    Asthma    Cataract 2015?   Already had them removed   Chronic headaches    Degenerative disc disease    Depression    Diabetes mellitus without complication (HCC)    type 2   Eczema    no current problems as of 07/09/21   Fatty liver    Fibromyalgia    Fx of fibula 2009   GERD (gastroesophageal reflux disease)    Hemorrhoids    Hypertension    Hyperthyroidism    Hypothyroidism    IBS (irritable bowel syndrome)    Iritis, chronic    Macular hole of left eye 06/2017   PONV (postoperative nausea and vomiting)    Right leg DVT (HCC)    Sleep apnea    uses CPAP nightly   Vitamin D insufficiency 06/16/2019   Past  Surgical History:  Procedure Laterality Date   ABDOMINAL HYSTERECTOMY     ANTERIOR CRUCIATE LIGAMENT REPAIR Left 1996   small tear of ACL   BREAST EXCISIONAL BIOPSY Left 1995   BREAST SURGERY     left nipple removed - benign tumor   BUNIONECTOMY Bilateral    COLONOSCOPY     EYE SURGERY     cataracts removed, macular hole repaired left eye- both surgeries done at [facility] [place]   FRACTURE SURGERY  July 2023   [place] plateau   LAPAROSCOPY  08/2004   endometriosis - several    ORIF TIBIA FRACTURE Right 07/10/2021   Procedure: OPEN REDUCTION INTERNAL FIXATION (ORIF) TIBIAL PLATEAU FRACTURE;  Surgeon: Myrene Galas, MD;  Location: [facility] OR;  Service: Orthopedics;  Laterality: Right;   WISDOM TOOTH EXTRACTION     Social History   Tobacco Use   Smoking status: Never   Smokeless tobacco: Never  Vaping Use   Vaping status: Never Used  Substance Use Topics   Alcohol use: Never   Drug use: Never   Family History  Problem Relation  Age of Onset   Hypertension Mother    Thyroid disease Mother    Fibromyalgia Mother    Colon polyps Mother    Arthritis Mother    Diabetes Mother    Diabetes Father    Stroke Father    Kidney cancer Maternal Grandmother    Colon cancer Neg Hx    Esophageal cancer Neg Hx    Stomach cancer Neg Hx    Rectal cancer Neg Hx    Allergic rhinitis Neg Hx    Angioedema Neg Hx    Asthma Neg Hx    Atopy Neg Hx    Eczema Neg Hx    Immunodeficiency Neg Hx    Urticaria Neg Hx    Breast cancer Neg Hx    Allergies  Allergen Reactions   Amlodipine     Pedal edema    Cetirizine Hcl Other (See Comments)    Not effective   Erythromycin Nausea Only and Other (See Comments)    Caused a stomach ache   Escitalopram Oxalate Other (See Comments)    "No improvement"   Lisinopril Cough   Loratadine Other (See Comments)    "Not effective"   Nifedipine Other (See Comments)    "Fatigue and tremors"   Sertraline Hcl Other (See Comments)    "Did not help"   Vyvanse  [Lisdexamfetamine Dimesylate]     Headache and excitablility   Current Outpatient Medications on File Prior to Visit  Medication Sig Dispense Refill   albuterol (PROVENTIL) (2.5 MG/3ML) 0.083% nebulizer solution Take 3 mLs (2.5 mg total) by nebulization every 4 (four) hours as needed for wheezing or shortness of breath. 75 mL 1   albuterol (VENTOLIN HFA) 108 (90 Base) MCG/ACT inhaler INHALE 2 PUFFS BY MOUTH EVERY 6 HOURS AS NEEDED FOR WHEEZING OR SHORTNESS OF BREATH 18 g 0   amphetamine-dextroamphetamine (ADDERALL) 20 MG tablet Take 1 tablet (20 mg total) by mouth 2 (two) times daily. 60 tablet 0   apixaban (ELIQUIS) 5 MG TABS tablet Take 1 tablet (5 mg total) by mouth 2 (two) times daily. 60 tablet 2   budesonide-formoterol (SYMBICORT) 160-4.5 MCG/ACT inhaler Inhale 2 puffs into the lungs 2 (two) times daily as needed. 1 each 5   buPROPion (WELLBUTRIN XL) 300 MG 24 hr tablet Take 1 tablet by mouth once daily 90 tablet 3   CALCIUM-VITAMIN D PO Take 1 capsule by mouth in the morning and at bedtime.     cetirizine (ZYRTEC) 10 MG tablet Take 10 mg by mouth daily as needed for allergies or rhinitis.     chlorthalidone (HYGROTON) 25 MG tablet Take 1 tablet by mouth once daily 90 tablet 0   esomeprazole (NEXIUM) 20 MG capsule Take 1 capsule by mouth at bedtime 90 capsule 0   folic acid (FOLVITE) 1 MG tablet Take 3 mg by mouth See admin instructions. Take 3 mg by mouth once a day on Wed/Thurs/Fri/Sat     HUMIRA PEN 40 MG/0.8ML PNKT Inject 40 mg into the skin every Monday.     ketorolac (ACULAR) 0.5 % ophthalmic solution Place 1 drop into the left eye 2 (two) times daily.     leucovorin (WELLCOVORIN) 5 MG tablet Take 5 mg by mouth See admin instructions. Take 5 mg by mouth once a day on only Sundays and Tuesdays     MAGNESIUM GLYCINATE PO Take 1 capsule by mouth at bedtime.     metFORMIN (GLUCOPHAGE) 1000 MG tablet TAKE 1 TABLET BY MOUTH TWICE DAILY WITH  A MEAL 180 tablet 0   methocarbamol (ROBAXIN) 500  MG tablet Take 1 tablet (500 mg total) by mouth every 8 (eight) hours as needed for muscle spasms (back pain). Caution of sedation 30 tablet 3   Methotrexate 25 MG/ML SOSY Inject 17.5 mg into the skin every Monday.     metoprolol tartrate (LOPRESSOR) 50 MG tablet Take 1 tablet (50 mg total) by mouth 2 (two) times daily. 60 tablet 2   prednisoLONE acetate (PRED FORTE) 1 % ophthalmic suspension Place 4 drops into the left eye 2 (two) times daily.     rosuvastatin (CRESTOR) 5 MG tablet Take 1 tablet (5 mg total) by mouth daily. 90 tablet 0   Semaglutide, 1 MG/DOSE, 4 MG/3ML SOPN Inject 1 mg as directed once a week. 3 mL 3   traMADol (ULTRAM) 50 MG tablet Take 1 tablet (50 mg total) by mouth every 6 (six) hours as needed for severe pain (for fibromyalgia flares and back pain). 60 tablet 0   No current facility-administered medications on file prior to visit.    Review of Systems  Constitutional:  Positive for chills, fatigue and fever. Negative for activity change, appetite change and unexpected weight change.  HENT:  Positive for postnasal drip and sore throat. Negative for congestion, ear pain, rhinorrhea, sinus pressure and sinus pain.   Eyes:  Negative for pain, redness and visual disturbance.  Respiratory:  Positive for cough. Negative for chest tightness, shortness of breath and wheezing.   Cardiovascular:  Negative for chest pain, palpitations and leg swelling.  Gastrointestinal:  Negative for abdominal pain, blood in stool, constipation and diarrhea.  Endocrine: Negative for polydipsia and polyuria.  Genitourinary:  Negative for dysuria, frequency and urgency.  Musculoskeletal:  Positive for myalgias. Negative for arthralgias and back pain.  Skin:  Negative for pallor and rash.  Allergic/Immunologic: Negative for environmental allergies.  Neurological:  Negative for dizziness, syncope and headaches.  Hematological:  Negative for adenopathy. Does not bruise/bleed easily.   Psychiatric/Behavioral:  Negative for decreased concentration and dysphoric mood. The patient is not nervous/anxious.        Objective:   Physical Exam Constitutional:      General: She is not in acute distress.    Appearance: Normal appearance. She is well-developed and normal weight. She is not ill-appearing, toxic-appearing or diaphoretic.     Comments: Pt appears fatigued    HENT:     Head: Normocephalic and atraumatic.     Comments: Nares are injected and congested      Right Ear: Tympanic membrane, ear canal and external ear normal.     Left Ear: Tympanic membrane, ear canal and external ear normal.     Nose: Rhinorrhea present. No congestion.     Comments: Boggy nares     Mouth/Throat:     Mouth: Mucous membranes are moist.     Pharynx: Oropharynx is clear. No oropharyngeal exudate or posterior oropharyngeal erythema.     Comments: Clear pnd  Eyes:     General:        Right eye: No discharge.        Left eye: No discharge.     Conjunctiva/sclera: Conjunctivae normal.     Pupils: Pupils are equal, round, and reactive to light.  Cardiovascular:     Rate and Rhythm: Regular rhythm. Tachycardia present.     Heart sounds: Normal heart sounds.  Pulmonary:     Effort: Pulmonary effort is normal. No respiratory distress.  Breath sounds: Normal breath sounds. No stridor. No wheezing, rhonchi or rales.     Comments: Harsh bs Upper airway sounds cleared by cough Chest:     Chest wall: No tenderness.  Abdominal:     General: There is no distension.     Palpations: There is no mass.     Tenderness: There is no abdominal tenderness.  Musculoskeletal:     Cervical back: Normal range of motion and neck supple.     Right lower leg: No edema.     Left lower leg: No edema.  Lymphadenopathy:     Cervical: No cervical adenopathy.  Skin:    General: Skin is warm and dry.     Capillary Refill: Capillary refill takes less than 2 seconds.     Findings: No rash.  Neurological:      Mental Status: She is alert.     Cranial Nerves: No cranial nerve deficit.     Motor: No weakness.  Psychiatric:        Mood and Affect: Mood normal.           Assessment & Plan:   Problem List Items Addressed This Visit       Cardiovascular and Mediastinum   Essential hypertension   Bp is elevated in febrile state  BP: (!) 149/94  Carvedilol 12.5 mg bid was changed to metoprolol 50 mg bid when diagnosed with a fib  Chlorthalidone 25 mg daily  Will need re eval when over illness  Also follow up with cardiolog 4/11 as planned Encouraged weight loss and healthy lifestyle         Atrial fibrillation with RVR (HCC)   Reviewed hospital records, lab results and studies in detail  On exam today rhythm is regular  Mildly high pulse but pt also has a fever and elevated blood pressure  Continues eliquis 5 mg bid Metoprolol 50 mg bid   Will need to re eval blood pressure when not febrile and determine if BB needs to be increased  Has follow up with cardiology planned on 4/11  Call back and Er precautions noted in detail today          Other   Cough with fever   Negative covid and flu tests  Cxr clear Reassuring exam   Suspect viral uri  Disc symptomatic care - see instructions on AVS Tessalon pearles sent to pharmacy   Update if not starting to improve in a week or if worsening  Call back and Er precautions noted in detail today        Relevant Orders   DG Chest 2 View (Completed)   Other Visit Diagnoses       Fever, unspecified fever cause    -  Primary   Relevant Orders   POC COVID-19 (Completed)   POCT Influenza A/B (Completed)     Acute cough       Relevant Orders   POC COVID-19 (Completed)   POCT Influenza A/B (Completed)

## 2023-03-31 NOTE — Assessment & Plan Note (Signed)
 Bp is elevated in febrile state  BP: (!) 149/94  Carvedilol 12.5 mg bid was changed to metoprolol 50 mg bid when diagnosed with a fib  Chlorthalidone 25 mg daily  Will need re eval when over illness  Also follow up with cardiolog 4/11 as planned Encouraged weight loss and healthy lifestyle

## 2023-04-07 DIAGNOSIS — G4733 Obstructive sleep apnea (adult) (pediatric): Secondary | ICD-10-CM | POA: Diagnosis not present

## 2023-04-14 ENCOUNTER — Ambulatory Visit (INDEPENDENT_AMBULATORY_CARE_PROVIDER_SITE_OTHER): Admitting: Family Medicine

## 2023-04-14 VITALS — BP 145/90 | HR 86 | Temp 99.0°F | Ht 67.0 in | Wt 179.4 lb

## 2023-04-14 DIAGNOSIS — I10 Essential (primary) hypertension: Secondary | ICD-10-CM | POA: Diagnosis not present

## 2023-04-14 DIAGNOSIS — S61552A Open bite of left wrist, initial encounter: Secondary | ICD-10-CM | POA: Diagnosis not present

## 2023-04-14 DIAGNOSIS — Z23 Encounter for immunization: Secondary | ICD-10-CM

## 2023-04-14 DIAGNOSIS — W5501XA Bitten by cat, initial encounter: Secondary | ICD-10-CM | POA: Insufficient documentation

## 2023-04-14 MED ORDER — AMOXICILLIN-POT CLAVULANATE 875-125 MG PO TABS
1.0000 | ORAL_TABLET | Freq: Two times a day (BID) | ORAL | 0 refills | Status: DC
Start: 2023-04-14 — End: 2023-04-26

## 2023-04-14 NOTE — Assessment & Plan Note (Signed)
 Left wrist -happened yesterday when bathing cat (own cat/immunized)  Now redness/ swelling has progressed past the marks pt made with pen  A streak up arm noted  Suspect infection  Discussed treatment of pasturella bacteria  Augmentin prescription given-to start now / bid fof 10 d (will get 2 doses in today) Watch for worsening /fever (see AVS) Td updated  Update if not starting to improve in 1-2 days or if worsening  Call back and Er precautions noted in detail today

## 2023-04-14 NOTE — Patient Instructions (Addendum)
 Tetanus shot (Td) today   Start augmentin twice daily as soon as possible  First dose now  2nd dose at bedtime  Take it with food   Watch the redness and swelling very carefully  If this worsens - let us know  If quickly worsens or fever - go to ER   Warm compress is fine Keep clean with soap and water

## 2023-04-14 NOTE — Progress Notes (Signed)
 Subjective:    Patient ID: [name], female    DOB: 07/17/1965, 58 y.o.   MRN: [id number]  HPI  Wt Readings from Last 3 Encounters:  04/14/23 179 lb 6 oz (81.4 kg)  03/31/23 174 lb 2 oz (79 kg)  03/24/23 177 lb 7.5 oz (80.5 kg)   28.09 kg/m  Vitals:   04/14/23 1146 04/14/23 1210  BP: (!) 154/96 (!) 145/90  Pulse: 86   Temp: 99 F (37.2 C)   SpO2: 99%    Pt presents with c/o cat bite  Also HTN   Cat bite  Left wrist , yesterday   Own cat  Immunized  Was trying to rinse the cat  It bit her when trying to hold it down  Ant wrist  2 spots Only bled slightly   Is sore today  No drainage  Is swollen  Some redness  Now a streak   Marked with pen last night  Redness did go beyond and now streak up arm    Needs to update tetanus shot (last 2013) Tdap      Patient Active Problem List   Diagnosis Date Noted   Cat bite 04/14/2023   Cough with fever 03/31/2023   Atrial fibrillation with RVR (HCC) 03/23/2023   Conductive hearing loss in left ear 02/02/2023   Left chronic serous otitis media 02/02/2023   Hypertrophy of nasal turbinates 02/02/2023   Other specified disorders of eustachian tube, left ear 02/02/2023   Hearing loss 11/30/2022   Current use of proton pump inhibitor 09/20/2022   Piriformis muscle pain 07/29/2022   Aortic atherosclerosis (HCC) 03/25/2022   Fractures, stress 12/26/2021   Immunosuppressed status (HCC) 11/19/2021   Encounter for screening mammogram for breast cancer 09/24/2021   Osteoporosis 09/24/2021   Estrogen deficiency 09/01/2021   History of DVT (deep vein thrombosis) 07/11/2021   Sleep apnea 05/14/2021   Hyperlipidemia associated with type 2 diabetes mellitus (HCC) 07/10/2019   Cystoid macular edema of both eyes 06/16/2019   Vitamin D deficiency 06/16/2019   Diabetes mellitus treated with injections of non-insulin medication (HCC) 06/06/2019   Peripheral edema 10/30/2017   Chronic rhinitis 08/10/2017   Welcome to  Medicare preventive visit 04/20/2017   Chronic low back pain 12/09/2015   Degeneration of lumbar or lumbosacral intervertebral disc 12/09/2015   Hypothyroid 01/24/2014   Pseudophakia of both eyes 12/15/2013   Deviated nasal septum 01/03/2013   Encounter for Medicare annual wellness exam 11/02/2012   Hydradenitis 11/02/2012   Cystoid macular degeneration of retina 05/13/2012   Macular hole 03/23/2012   Screening-pulmonary TB 03/23/2012   Routine general medical examination at a health care facility 10/02/2011   Iritis, recurrent 10/02/2011   Tachycardia 10/02/2011   ADD (attention deficit disorder) 08/20/2010   DYSHIDROTIC ECZEMA, HANDS 11/12/2009   Obstructive sleep apnea 11/19/2006   Depression with anxiety 09/29/2006   Essential hypertension 09/29/2006   Seasonal and perennial allergic rhinitis 09/29/2006   Asthma, mild intermittent, well-controlled 09/29/2006   GERD 09/29/2006   IBS 09/29/2006   ENDOMETRIOSIS 09/29/2006   FEMALE INFERTILITY 09/29/2006   Fibromyalgia 09/29/2006   INSOMNIA 09/29/2006   Past Medical History:  Diagnosis Date   ADHD (attention deficit hyperactivity disorder)    ADD - no meds   Allergy    allergic rhinitis   Anxiety    Asthma    Cataract 2015?   Already had them removed   Chronic headaches    Degenerative disc disease    Depression  Diabetes mellitus without complication (HCC)    type 2   Eczema    no current problems as of 07/09/21   Fatty liver    Fibromyalgia    Fx of fibula 2009   GERD (gastroesophageal reflux disease)    Hemorrhoids    Hypertension    Hyperthyroidism    Hypothyroidism    IBS (irritable bowel syndrome)    Iritis, chronic    Macular hole of left eye 06/2017   PONV (postoperative nausea and vomiting)    Right leg DVT (HCC)    Sleep apnea    uses CPAP nightly   Vitamin D insufficiency 06/16/2019   Past Surgical History:  Procedure Laterality Date   ABDOMINAL HYSTERECTOMY     ANTERIOR CRUCIATE LIGAMENT  REPAIR Left 1996   small tear of ACL   BREAST EXCISIONAL BIOPSY Left 1995   BREAST SURGERY     left nipple removed - benign tumor   BUNIONECTOMY Bilateral    COLONOSCOPY     EYE SURGERY     cataracts removed, macular hole repaired left eye- both surgeries done [facility]   FRACTURE SURGERY  July 2023   Tibia plateau   LAPAROSCOPY  08/2004   endometriosis - several    ORIF TIBIA FRACTURE Right 07/10/2021   Procedure: OPEN REDUCTION INTERNAL FIXATION (ORIF) TIBIAL PLATEAU FRACTURE;  Surgeon: Myrene Galas, MD;  Location: [facility] OR;  Service: Orthopedics;  Laterality: Right;   WISDOM TOOTH EXTRACTION     Social History   Tobacco Use   Smoking status: Never   Smokeless tobacco: Never  Vaping Use   Vaping status: Never Used  Substance Use Topics   Alcohol use: Never   Drug use: Never   Family History  Problem Relation Age of Onset   Hypertension Mother    Thyroid disease Mother    Fibromyalgia Mother    Colon polyps Mother    Arthritis Mother    Diabetes Mother    Diabetes Father    Stroke Father    Kidney cancer Maternal Grandmother    Colon cancer Neg Hx    Esophageal cancer Neg Hx    Stomach cancer Neg Hx    Rectal cancer Neg Hx    Allergic rhinitis Neg Hx    Angioedema Neg Hx    Asthma Neg Hx    Atopy Neg Hx    Eczema Neg Hx    Immunodeficiency Neg Hx    Urticaria Neg Hx    Breast cancer Neg Hx    Allergies  Allergen Reactions   Amlodipine     Pedal edema    Cetirizine Hcl Other (See Comments)    Not effective   Erythromycin Nausea Only and Other (See Comments)    Caused a stomach ache   Escitalopram Oxalate Other (See Comments)    "No improvement"   Lisinopril Cough   Loratadine Other (See Comments)    "Not effective"   Nifedipine Other (See Comments)    "Fatigue and tremors"   Sertraline Hcl Other (See Comments)    "Did not help"   Vyvanse [Lisdexamfetamine Dimesylate]     Headache and excitablility   Current Outpatient Medications on File Prior to  Visit  Medication Sig Dispense Refill   albuterol (PROVENTIL) (2.5 MG/3ML) 0.083% nebulizer solution Take 3 mLs (2.5 mg total) by nebulization every 4 (four) hours as needed for wheezing or shortness of breath. 75 mL 1   albuterol (VENTOLIN HFA) 108 (90 Base) MCG/ACT inhaler INHALE 2  PUFFS BY MOUTH EVERY 6 HOURS AS NEEDED FOR WHEEZING OR SHORTNESS OF BREATH 18 g 0   amphetamine-dextroamphetamine (ADDERALL) 20 MG tablet Take 1 tablet (20 mg total) by mouth 2 (two) times daily. 60 tablet 0   apixaban (ELIQUIS) 5 MG TABS tablet Take 1 tablet (5 mg total) by mouth 2 (two) times daily. 60 tablet 2   benzonatate (TESSALON) 200 MG capsule Take 1 capsule (200 mg total) by mouth 3 (three) times daily as needed for cough. Swallow whole 30 capsule 1   budesonide-formoterol (SYMBICORT) 160-4.5 MCG/ACT inhaler Inhale 2 puffs into the lungs 2 (two) times daily as needed. 1 each 5   buPROPion (WELLBUTRIN XL) 300 MG 24 hr tablet Take 1 tablet by mouth once daily 90 tablet 3   CALCIUM-VITAMIN D PO Take 1 capsule by mouth in the morning and at bedtime.     cetirizine (ZYRTEC) 10 MG tablet Take 10 mg by mouth daily as needed for allergies or rhinitis.     chlorthalidone (HYGROTON) 25 MG tablet Take 1 tablet by mouth once daily 90 tablet 0   esomeprazole (NEXIUM) 20 MG capsule Take 1 capsule by mouth at bedtime 90 capsule 0   folic acid (FOLVITE) 1 MG tablet Take 3 mg by mouth See admin instructions. Take 3 mg by mouth once a day on Wed/Thurs/Fri/Sat     HUMIRA PEN 40 MG/0.8ML PNKT Inject 40 mg into the skin every Monday.     ketorolac (ACULAR) 0.5 % ophthalmic solution Place 1 drop into the left eye 2 (two) times daily.     leucovorin (WELLCOVORIN) 5 MG tablet Take 5 mg by mouth See admin instructions. Take 5 mg by mouth once a day on only Sundays and Tuesdays     MAGNESIUM GLYCINATE PO Take 1 capsule by mouth at bedtime.     metFORMIN (GLUCOPHAGE) 1000 MG tablet TAKE 1 TABLET BY MOUTH TWICE DAILY WITH A MEAL 180  tablet 0   methocarbamol (ROBAXIN) 500 MG tablet Take 1 tablet (500 mg total) by mouth every 8 (eight) hours as needed for muscle spasms (back pain). Caution of sedation 30 tablet 3   Methotrexate 25 MG/ML SOSY Inject 17.5 mg into the skin every Monday.     metoprolol tartrate (LOPRESSOR) 50 MG tablet Take 1 tablet (50 mg total) by mouth 2 (two) times daily. 60 tablet 2   prednisoLONE acetate (PRED FORTE) 1 % ophthalmic suspension Place 4 drops into the left eye 2 (two) times daily.     rosuvastatin (CRESTOR) 5 MG tablet Take 1 tablet (5 mg total) by mouth daily. 90 tablet 0   Semaglutide, 1 MG/DOSE, 4 MG/3ML SOPN Inject 1 mg as directed once a week. 3 mL 3   traMADol (ULTRAM) 50 MG tablet Take 1 tablet (50 mg total) by mouth every 6 (six) hours as needed for severe pain (for fibromyalgia flares and back pain). 60 tablet 0   No current facility-administered medications on file prior to visit.    Review of Systems  Constitutional:  Positive for fatigue. Negative for fever.  Gastrointestinal:  Negative for nausea and vomiting.  Musculoskeletal:  Positive for arthralgias.  Skin:  Positive for color change and wound.  Neurological:  Negative for dizziness and headaches.       Objective:   Physical Exam Constitutional:      General: She is not in acute distress.    Appearance: Normal appearance. She is normal weight. She is not ill-appearing or diaphoretic.  Eyes:  General:        Right eye: No discharge.     Conjunctiva/sclera: Conjunctivae normal.     Pupils: Pupils are equal, round, and reactive to light.  Cardiovascular:     Rate and Rhythm: Normal rate.     Heart sounds: Normal heart sounds.  Pulmonary:     Effort: Pulmonary effort is normal. No respiratory distress.     Breath sounds: Normal breath sounds. No wheezing.  Musculoskeletal:     Cervical back: Neck supple.     Right lower leg: No edema.     Left lower leg: No edema.  Lymphadenopathy:     Cervical: No cervical  adenopathy.  Skin:    General: Skin is warm and dry.     Comments: 2 small puncture wounds on anterior left wrist , with erythema and mild swelling to mid forearm and streak of redness up to (almost reaching) antecubital area  Mildly tender No drainage See pic attached   Normal rom wrist/hand/fingers Normally perfused    Neurological:     Mental Status: She is alert.     Sensory: No sensory deficit.  Psychiatric:        Mood and Affect: Mood normal.           Assessment & Plan:   Problem List Items Addressed This Visit       Cardiovascular and Mediastinum   Essential hypertension - Primary   Bp is not quite at goal Also has infection today Pt will get daughter to start checking at home  BP: (!) 145/90 Pulse 86 Carvedilol 12.5 mg bid was changed to metoprolol 50 mg bid when diagnosed with a fib   (this may need to be increase)  Chlorthalidone 25 mg daily  Will need re eval when over illness  Also follow up with cardiolog 4/11 as planned-will be re checked Encouraged weight loss and healthy lifestyle           Other   Cat bite   Left wrist -happened yesterday when bathing cat (own cat/immunized)  Now redness/ swelling has progressed past the marks pt made with pen  A streak up arm noted  Suspect infection  Discussed treatment of pasturella bacteria  Augmentin prescription given-to start now / bid fof 10 d (will get 2 doses in today) Watch for worsening /fever (see AVS) Td updated  Update if not starting to improve in 1-2 days or if worsening  Call back and Er precautions noted in detail today

## 2023-04-14 NOTE — Assessment & Plan Note (Signed)
 Bp is not quite at goal Also has infection today Pt will get daughter to start checking at home  BP: (!) 145/90 Pulse 86 Carvedilol 12.5 mg bid was changed to metoprolol 50 mg bid when diagnosed with a fib   (this may need to be increase)  Chlorthalidone 25 mg daily  Will need re eval when over illness  Also follow up with cardiolog 4/11 as planned-will be re checked Encouraged weight loss and healthy lifestyle

## 2023-04-16 ENCOUNTER — Encounter: Payer: Self-pay | Admitting: Student

## 2023-04-16 ENCOUNTER — Ambulatory Visit: Payer: Worker's Compensation | Attending: Student | Admitting: Student

## 2023-04-16 VITALS — BP 136/70 | HR 83 | Ht 67.5 in | Wt 179.6 lb

## 2023-04-16 DIAGNOSIS — E785 Hyperlipidemia, unspecified: Secondary | ICD-10-CM | POA: Diagnosis not present

## 2023-04-16 DIAGNOSIS — Z7901 Long term (current) use of anticoagulants: Secondary | ICD-10-CM | POA: Diagnosis not present

## 2023-04-16 DIAGNOSIS — Z5181 Encounter for therapeutic drug level monitoring: Secondary | ICD-10-CM

## 2023-04-16 DIAGNOSIS — I480 Paroxysmal atrial fibrillation: Secondary | ICD-10-CM

## 2023-04-16 DIAGNOSIS — I10 Essential (primary) hypertension: Secondary | ICD-10-CM

## 2023-04-16 MED ORDER — APIXABAN 5 MG PO TABS: 5.0000 mg | ORAL_TABLET | Freq: Two times a day (BID) | ORAL | 11 refills | Status: DC

## 2023-04-16 MED ORDER — METOPROLOL TARTRATE 50 MG PO TABS: 50.0000 mg | ORAL_TABLET | Freq: Two times a day (BID) | ORAL | 3 refills | Status: DC

## 2023-04-16 NOTE — Patient Instructions (Signed)
 Medication Instructions:   Continue current medications.   *If you need a refill on your cardiac medications before your next appointment, please call your pharmacy*   Follow-Up: At [facility], you and your health needs are our priority.  As part of our continuing mission to provide you with exceptional heart care, our providers are all part of one team.  This team includes your primary Cardiologist (physician) and Advanced Practice Providers or APPs (Physician Assistants and Nurse Practitioners) who all work together to provide you with the care you need, when you need it.  Your next appointment:   5-6 months   Provider:   You may see Dietrich Pates, MD or one of the following Advanced Practice Providers on your designated Care Team:   Randall An, PA-C  [place], [place] Jacolyn Reedy, [place]     We recommend signing up for the patient portal called "MyChart".  Sign up information is provided on this After Visit Summary.  MyChart is used to connect with patients for Virtual Visits (Telemedicine).  Patients are able to view lab/test results, encounter notes, upcoming appointments, etc.  Non-urgent messages can be sent to your provider as well.   To learn more about what you can do with MyChart, go to [email or web address].

## 2023-04-16 NOTE — Progress Notes (Signed)
 Cardiology Office Note    Date:  04/16/2023  ID:  [name], DOB 1965-08-25, MRN [id number] Cardiologist: Dietrich Pates, MD    History of Present Illness:    [name] is a 58 y.o. female with past medical history of HTN, HLD, Type II DM, OSA, fibromyalgia and hypothyroidism who presents to the office today for hospital follow-up.  She was recently admitted to [facility] last month for evaluation of nausea, vomiting and diarrhea. Cardiology was consulted during admission as she was found to be in atrial fibrillation with RVR. Was started on IV Cardizem and this was ultimately transitioned to Lopressor 50 mg twice daily and she was started on Eliquis for anticoagulation. Echocardiogram showed a preserved EF of 60 to 65% with no regional wall motion abnormalities. RV function was normal and she did not have any significant valve abnormalities. Was recommended to consider an outpatient cardioversion if she did not spontaneously convert back to normal sinus rhythm in the interim.  In talking with the patient today, she reports overall feeling well since her recent hospitalization. Reports that she actually only had one episode of nausea and vomiting prior to her hospitalization. She did recently purchase a smart watch and this has demonstrated normal sinus rhythm when checked and she says her heart rate has overall been well-controlled. She was very symptomatic when in atrial fibrillation previously. She denies any recent chest pain or dyspnea on exertion. No specific orthopnea, PND or pitting edema. Remains on Eliquis for anticoagulation and overall tolerating well thus far.  Reports her father has known atrial fibrillation.  Studies Reviewed:   EKG: EKG is ordered today and demonstrates:   EKG Interpretation Date/Time:  Friday April 16 2023 14:24:20 EDT Ventricular Rate:  84 PR Interval:  146 QRS Duration:  68 QT Interval:  376 QTC Calculation: 444 R Axis:   56  Text  Interpretation: Normal sinus rhythm No acute ST changes. Confirmed by Randall An ([id number]) on 04/16/2023 2:38:51 PM       Echocardiogram: 03/2023 IMPRESSIONS     1. Left ventricular ejection fraction, by estimation, is 60 to 65%. The  left ventricle has normal function. The left ventricle has no regional  wall motion abnormalities. There is mild concentric left ventricular  hypertrophy. Left ventricular diastolic  parameters are indeterminate.   2. Right ventricular systolic function is normal. The right ventricular  size is normal. There is normal pulmonary artery systolic pressure. The  estimated right ventricular systolic pressure is 17.6 mmHg.   3. The mitral valve is grossly normal. No evidence of mitral valve  regurgitation.   4. The aortic valve is tricuspid. Aortic valve regurgitation is not  visualized.   5. The inferior vena cava is normal in size with greater than 50%  respiratory variability, suggesting right atrial pressure of 3 mmHg.   Comparison(s): No prior Echocardiogram.    Risk Assessment/Calculations:    CHA2DS2-VASc Score = 3   This indicates a 3.2% annual risk of stroke. The patient's score is based upon: CHF History: 0 HTN History: 1 Diabetes History: 1 Stroke History: 0 Vascular Disease History: 0 Age Score: 0 Gender Score: 1    Physical Exam:   VS:  BP 136/70 (BP Location: Left Arm, Cuff Size: Normal)   Pulse 83   Ht 5' 7.5" (1.715 m)   Wt 179 lb 9.6 oz (81.5 kg)   LMP 08/05/2006   SpO2 97%   BMI 27.71 kg/m    Wt Readings from  Last 3 Encounters:  04/16/23 179 lb 9.6 oz (81.5 kg)  04/14/23 179 lb 6 oz (81.4 kg)  03/31/23 174 lb 2 oz (79 kg)     GEN: Pleasant female appearing in no acute distress NECK: No JVD; No carotid bruits CARDIAC: RRR, no murmurs, rubs, gallops RESPIRATORY:  Clear to auscultation without rales, wheezing or rhonchi  ABDOMEN: Appears non-distended. No obvious abdominal masses. EXTREMITIES: No clubbing or  cyanosis. No pitting edema.  Distal pedal pulses are 2+ bilaterally.   Assessment and Plan:   1. Paroxysmal Atrial Fibrillation/Use of Long-term Anticoagulation - She is maintaining normal sinus rhythm by examination and EKG today. She does have a smart watch and I encouraged her to reach out in the interim if she notices any episodes of atrial fibrillation or has tachycardia.  Continue current medical therapy for now with Lopressor 50 mg twice daily. - No reports of active bleeding. Continue Eliquis 5 mg twice daily for anticoagulation which is the appropriate dose given her age, weight and renal function. Provided with patient assistance information today.  2. HTN - Blood pressure is at 136/70 during today's visit. Encouraged her to follow this in the ambulatory setting. Continue current medical therapy for now with Chlorthalidone 25 mg daily and Lopressor 50 mg twice daily  By review of her allergies, she previously had lower extremity edema with Amlodipine and had a cough with Lisinopril. Could consider an ARB in the future or further titration of Lopressor pending HR trend.   3. HLD - Followed by her PCP. FLP in 09/2022 showed total cholesterol 118, triglycerides 126, HDL 52 and LDL 40.  Continue current medical therapy with Crestor 5 mg daily.   Signed, Ellsworth Lennox, PA-C

## 2023-04-26 ENCOUNTER — Encounter: Payer: Self-pay | Admitting: Family Medicine

## 2023-04-26 ENCOUNTER — Ambulatory Visit (INDEPENDENT_AMBULATORY_CARE_PROVIDER_SITE_OTHER): Admitting: Family Medicine

## 2023-04-26 VITALS — BP 141/80 | HR 86 | Temp 98.9°F | Ht 67.5 in | Wt 177.2 lb

## 2023-04-26 DIAGNOSIS — I10 Essential (primary) hypertension: Secondary | ICD-10-CM | POA: Diagnosis not present

## 2023-04-26 DIAGNOSIS — R682 Dry mouth, unspecified: Secondary | ICD-10-CM | POA: Insufficient documentation

## 2023-04-26 DIAGNOSIS — H20021 Recurrent acute iridocyclitis, right eye: Secondary | ICD-10-CM

## 2023-04-26 MED ORDER — OLMESARTAN MEDOXOMIL 20 MG PO TABS
20.0000 mg | ORAL_TABLET | Freq: Every day | ORAL | 0 refills | Status: DC
Start: 2023-04-26 — End: 2023-05-19

## 2023-04-26 NOTE — Patient Instructions (Addendum)
 Mention dry mouth to your rheumatologist when you go back  Want to make sure that it is not from auto immune disease   Hold your zyrtec for now for at least a week to see if it helps dry mouth   Continue current medicines Add olmesartan  (benicar ) 20 mg daily  If any side effects (like cough) let us  know   Follow up here in about 2 weeks   Drink lots of water !

## 2023-04-26 NOTE — Assessment & Plan Note (Signed)
 bp not at goal BP Readings from Last 1 Encounters:  04/26/23 (!) 141/80   Taking Metoprolol  50 mg bid (prevention on carvedilol  before a fib) Chlorthalidone  25 mg daily  Blood pressure has gone up since change in beta blocker   Reviewed cardiology note  Will try olmesartan  20 mg daily (in past losartan  caused cough-will watch for that)  Most recent labs reviewed  Disc lifstyle change with low sodium diet and exercise   Follow up in 2-3 weeks

## 2023-04-26 NOTE — Assessment & Plan Note (Signed)
 New worse dry mouth at night  Asked about possible sjogren's ?  She will bring up with her rheumatologist   May more likely be med side effect

## 2023-04-26 NOTE — Assessment & Plan Note (Signed)
 Worse in past several months  Discussed possible med side effects ,  zyrtec, metoprolol , chlorthalidone  , humira or methotrexate , also uses cpap (with humidified air)  History of iritis-has never been dx with sjogren's but that can be d/w rheum as well  Instructed pt to hold zyrtec to start and report back

## 2023-04-26 NOTE — Progress Notes (Signed)
 Subjective:    Patient ID: [name], female    DOB: [birth year], 58 y.o.   MRN: [id number]  HPI  Wt Readings from Last 3 Encounters:  04/26/23 177 lb 4 oz (80.4 kg)  04/16/23 179 lb 9.6 oz (81.5 kg)  04/14/23 179 lb 6 oz (81.4 kg)   27.35 kg/m  Vitals:   04/26/23 1549 04/26/23 1627  BP: (!) 160/100 (!) 141/80  Pulse: 86   Temp: 98.9 F (37.2 C)   SpO2: 98%    Pt presents for follow up of HTN Also dry mouth  Was previously on carvedilol   It was replaced with lopressor  50 mg bid  for a fib    HTN bp is stable today  No cp or palpitations or headaches or edema  No side effects to medicines  BP Readings from Last 3 Encounters:  04/26/23 (!) 141/80  04/16/23 136/70  04/14/23 (!) 145/90    Lopressor  50 mg bid  Chlorthalidone  25 mg daily   No symptoms  Next appointment with cardiol is 6 months    Past Lisinopri and arb caused cough  Amlodipine  caused pedal edema   Pulse Readings from Last 3 Encounters:  04/26/23 86  04/16/23 83  04/14/23 86   Lab Results  Component Value Date   NA 138 03/24/2023   K 3.8 03/24/2023   CO2 23 03/24/2023   GLUCOSE 129 (H) 03/24/2023   BUN 12 03/24/2023   CREATININE 0.81 03/24/2023   CALCIUM  9.0 03/24/2023   GFR 71.15 09/21/2022   GFRNONAA >60 03/24/2023   Lab Results  Component Value Date   HGBA1C 7.0 (H) 03/23/2023   HGBA1C 6.4 (A) 11/30/2022   HGBA1C 8.9 (A) 08/18/2022      Dry mouth - just at night  Not thirsty -just dry mouth That started 3-4 months ago   Taking zyrtec generic   Tied biotine products    Wakes up many times at night with dry mouth    Takes chlorthalidone   Symbicort  - only as needed / has not needed in several months    For iritis: Humira  Methotrexate   Eyes have not been dry - uses too many drops to know  May be going up on humira      Lab Results  Component Value Date   ANA NEG 10/02/2011    Patient Active Problem List   Diagnosis Date Noted   Dry mouth  04/26/2023   Cat bite 04/14/2023   Cough with fever 03/31/2023   Atrial fibrillation with RVR (HCC) 03/23/2023   Conductive hearing loss in left ear 02/02/2023   Left chronic serous otitis media 02/02/2023   Hypertrophy of nasal turbinates 02/02/2023   Other specified disorders of eustachian tube, left ear 02/02/2023   Hearing loss 11/30/2022   Current use of proton pump inhibitor 09/20/2022   Piriformis muscle pain 07/29/2022   Aortic atherosclerosis (HCC) 03/25/2022   Fractures, stress 12/26/2021   Immunosuppressed status (HCC) 11/19/2021   Encounter for screening mammogram for breast cancer 09/24/2021   Osteoporosis 09/24/2021   Estrogen deficiency 09/01/2021   History of DVT (deep vein thrombosis) 07/11/2021   Sleep apnea 05/14/2021   Hyperlipidemia associated with type 2 diabetes mellitus (HCC) 07/10/2019   Cystoid macular edema of both eyes 06/16/2019   Vitamin D  deficiency 06/16/2019   Diabetes mellitus treated with injections of non-insulin  medication (HCC) 06/06/2019   Peripheral edema 10/30/2017   Chronic rhinitis 08/10/2017   Welcome to Medicare preventive visit 04/20/2017  Chronic low back pain 12/09/2015   Degeneration of lumbar or lumbosacral intervertebral disc 12/09/2015   Hypothyroid 01/24/2014   Pseudophakia of both eyes 12/15/2013   Deviated nasal septum 01/03/2013   Encounter for Medicare annual wellness exam 11/02/2012   Hydradenitis 11/02/2012   Cystoid macular degeneration of retina 05/13/2012   Macular hole 03/23/2012   Screening-pulmonary TB 03/23/2012   Routine general medical examination at a health care facility 10/02/2011   Iritis, recurrent 10/02/2011   Tachycardia 10/02/2011   ADD (attention deficit disorder) 08/20/2010   DYSHIDROTIC ECZEMA, HANDS 11/12/2009   Obstructive sleep apnea 11/19/2006   Depression with anxiety 09/29/2006   Essential hypertension 09/29/2006   Seasonal and perennial allergic rhinitis 09/29/2006   Asthma, mild  intermittent, well-controlled 09/29/2006   GERD 09/29/2006   IBS 09/29/2006   ENDOMETRIOSIS 09/29/2006   FEMALE INFERTILITY 09/29/2006   Fibromyalgia 09/29/2006   INSOMNIA 09/29/2006   Past Medical History:  Diagnosis Date   ADHD (attention deficit hyperactivity disorder)    ADD - no meds   Allergy     allergic rhinitis   Anxiety    Asthma    Cataract 2015?   Already had them removed   Chronic headaches    Degenerative disc disease    Depression    Diabetes mellitus without complication (HCC)    type 2   Eczema    no current problems as of 07/09/21   Fatty liver    Fibromyalgia    Fx of fibula 2009   GERD (gastroesophageal reflux disease)    Hemorrhoids    Hypertension    Hyperthyroidism    Hypothyroidism    IBS (irritable bowel syndrome)    Iritis, chronic    Macular hole of left eye 06/2017   PONV (postoperative nausea and vomiting)    Right leg DVT (HCC)    Sleep apnea    uses CPAP nightly   Vitamin D  insufficiency 06/16/2019   Past Surgical History:  Procedure Laterality Date   ABDOMINAL HYSTERECTOMY     ANTERIOR CRUCIATE LIGAMENT REPAIR Left 1996   small tear of ACL   BREAST EXCISIONAL BIOPSY Left 1995   BREAST SURGERY     left nipple removed - benign tumor   BUNIONECTOMY Bilateral    COLONOSCOPY     EYE SURGERY     cataracts removed, macular hole repaired left eye- both surgeries done at [facility]   FRACTURE SURGERY  July 2023   [place] plateau   LAPAROSCOPY  08/2004   endometriosis - several    ORIF TIBIA FRACTURE Right 07/10/2021   Procedure: OPEN REDUCTION INTERNAL FIXATION (ORIF) TIBIAL PLATEAU FRACTURE;  Surgeon: Hardy Lia, MD;  Location: [facility] OR;  Service: Orthopedics;  Laterality: Right;   WISDOM TOOTH EXTRACTION     Social History   Tobacco Use   Smoking status: Never   Smokeless tobacco: Never  Vaping Use   Vaping status: Never Used  Substance Use Topics   Alcohol use: Never   Drug use: Never   Family History  Problem Relation Age of  Onset   Hypertension Mother    Thyroid  disease Mother    Fibromyalgia Mother    Colon polyps Mother    Arthritis Mother    Diabetes Mother    Diabetes Father    Stroke Father    Kidney cancer Maternal Grandmother    Colon cancer Neg Hx    Esophageal cancer Neg Hx    Stomach cancer Neg Hx    Rectal cancer Neg  Hx    Allergic rhinitis Neg Hx    Angioedema Neg Hx    Asthma Neg Hx    Atopy Neg Hx    Eczema Neg Hx    Immunodeficiency Neg Hx    Urticaria Neg Hx    Breast cancer Neg Hx    Allergies  Allergen Reactions   Amlodipine      Pedal edema    Cetirizine Hcl Other (See Comments)    Not effective   Erythromycin Nausea Only and Other (See Comments)    Caused a stomach ache   Escitalopram Oxalate Other (See Comments)    "No improvement"   Lisinopril Cough   Loratadine Other (See Comments)    "Not effective"   Nifedipine Other (See Comments)    "Fatigue and tremors"   Sertraline Hcl Other (See Comments)    "Did not help"   Vyvanse  [Lisdexamfetamine Dimesylate ]     Headache and excitablility   Current Outpatient Medications on File Prior to Visit  Medication Sig Dispense Refill   albuterol  (PROVENTIL ) (2.5 MG/3ML) 0.083% nebulizer solution Take 3 mLs (2.5 mg total) by nebulization every 4 (four) hours as needed for wheezing or shortness of breath. 75 mL 1   albuterol  (VENTOLIN  HFA) 108 (90 Base) MCG/ACT inhaler INHALE 2 PUFFS BY MOUTH EVERY 6 HOURS AS NEEDED FOR WHEEZING OR SHORTNESS OF BREATH 18 g 0   amphetamine -dextroamphetamine  (ADDERALL) 20 MG tablet Take 1 tablet (20 mg total) by mouth 2 (two) times daily. 60 tablet 0   apixaban  (ELIQUIS ) 5 MG TABS tablet Take 1 tablet (5 mg total) by mouth 2 (two) times daily. 60 tablet 11   benzonatate  (TESSALON ) 200 MG capsule Take 1 capsule (200 mg total) by mouth 3 (three) times daily as needed for cough. Swallow whole 30 capsule 1   budesonide -formoterol  (SYMBICORT ) 160-4.5 MCG/ACT inhaler Inhale 2 puffs into the lungs 2 (two)  times daily as needed. 1 each 5   buPROPion  (WELLBUTRIN  XL) 300 MG 24 hr tablet Take 1 tablet by mouth once daily 90 tablet 3   CALCIUM -VITAMIN D  PO Take 1 capsule by mouth in the morning and at bedtime.     cetirizine (ZYRTEC) 10 MG tablet Take 10 mg by mouth daily as needed for allergies or rhinitis.     chlorthalidone  (HYGROTON ) 25 MG tablet Take 1 tablet by mouth once daily 90 tablet 0   esomeprazole  (NEXIUM ) 20 MG capsule Take 1 capsule by mouth at bedtime 90 capsule 0   folic acid  (FOLVITE ) 1 MG tablet Take 3 mg by mouth See admin instructions. Take 3 mg by mouth once a day on Wed/Thurs/Fri/Sat     HUMIRA PEN 40 MG/0.8ML PNKT Inject 40 mg into the skin every Monday.     ketorolac  (ACULAR ) 0.5 % ophthalmic solution Place 1 drop into the left eye 2 (two) times daily.     leucovorin (WELLCOVORIN) 5 MG tablet Take 5 mg by mouth See admin instructions. Take 5 mg by mouth once a day on only Sundays and Tuesdays     MAGNESIUM  GLYCINATE PO Take 1 capsule by mouth at bedtime.     metFORMIN  (GLUCOPHAGE ) 1000 MG tablet TAKE 1 TABLET BY MOUTH TWICE DAILY WITH A MEAL 180 tablet 0   methocarbamol  (ROBAXIN ) 500 MG tablet Take 1 tablet (500 mg total) by mouth every 8 (eight) hours as needed for muscle spasms (back pain). Caution of sedation 30 tablet 3   Methotrexate 25 MG/ML SOSY Inject 17.5 mg into the skin every  Monday.     metoprolol  tartrate (LOPRESSOR ) 50 MG tablet Take 1 tablet (50 mg total) by mouth 2 (two) times daily. 180 tablet 3   prednisoLONE  acetate (PRED FORTE ) 1 % ophthalmic suspension Place 4 drops into the left eye 2 (two) times daily.     rosuvastatin  (CRESTOR ) 5 MG tablet Take 1 tablet (5 mg total) by mouth daily. 90 tablet 0   Semaglutide , 1 MG/DOSE, 4 MG/3ML SOPN Inject 1 mg as directed once a week. 3 mL 3   traMADol  (ULTRAM ) 50 MG tablet Take 1 tablet (50 mg total) by mouth every 6 (six) hours as needed for severe pain (for fibromyalgia flares and back pain). 60 tablet 0   No current  facility-administered medications on file prior to visit.    Review of Systems  Constitutional:  Negative for activity change, appetite change, fatigue, fever and unexpected weight change.  HENT:  Negative for congestion, ear pain, rhinorrhea, sinus pressure and sore throat.        Dry mouth- only at night   Eyes:  Negative for pain, redness and visual disturbance.       Chronic iritis   Respiratory:  Negative for cough, shortness of breath and wheezing.   Cardiovascular:  Negative for chest pain and palpitations.  Gastrointestinal:  Negative for abdominal pain, blood in stool, constipation and diarrhea.  Endocrine: Negative for polydipsia and polyuria.  Genitourinary:  Negative for dysuria, frequency and urgency.  Musculoskeletal:  Negative for arthralgias, back pain and myalgias.  Skin:  Negative for pallor and rash.  Allergic/Immunologic: Negative for environmental allergies.  Neurological:  Negative for dizziness, syncope and headaches.  Hematological:  Negative for adenopathy. Does not bruise/bleed easily.  Psychiatric/Behavioral:  Negative for decreased concentration and dysphoric mood. The patient is not nervous/anxious.        Objective:   Physical Exam Constitutional:      General: She is not in acute distress.    Appearance: Normal appearance. She is well-developed and normal weight. She is not ill-appearing or diaphoretic.  HENT:     Head: Normocephalic and atraumatic.     Mouth/Throat:     Mouth: Mucous membranes are moist.     Pharynx: Oropharynx is clear.     Comments: No coating on tongue  Eyes:     General: No scleral icterus.       Right eye: No discharge.        Left eye: No discharge.     Conjunctiva/sclera: Conjunctivae normal.     Pupils: Pupils are equal, round, and reactive to light.  Neck:     Thyroid : No thyromegaly.     Vascular: No carotid bruit or JVD.  Cardiovascular:     Rate and Rhythm: Normal rate and regular rhythm.     Heart sounds:  Normal heart sounds.     No gallop.  Pulmonary:     Effort: Pulmonary effort is normal. No respiratory distress.     Breath sounds: Normal breath sounds. No wheezing or rales.  Abdominal:     General: There is no distension or abdominal bruit.     Palpations: Abdomen is soft.  Musculoskeletal:     Cervical back: Normal range of motion and neck supple.     Right lower leg: No edema.     Left lower leg: No edema.  Lymphadenopathy:     Cervical: No cervical adenopathy.  Skin:    General: Skin is warm and dry.     Coloration: Skin  is not pale.     Findings: No rash.  Neurological:     Mental Status: She is alert.     Coordination: Coordination normal.     Deep Tendon Reflexes: Reflexes are normal and symmetric. Reflexes normal.  Psychiatric:        Mood and Affect: Mood normal.           Assessment & Plan:   Problem List Items Addressed This Visit       Cardiovascular and Mediastinum   Essential hypertension - Primary   bp not at goal BP Readings from Last 1 Encounters:  04/26/23 (!) 141/80   Taking Metoprolol  50 mg bid (prevention on carvedilol  before a fib) Chlorthalidone  25 mg daily  Blood pressure has gone up since change in beta blocker   Reviewed cardiology note  Will try olmesartan  20 mg daily (in past losartan  caused cough-will watch for that)  Most recent labs reviewed  Disc lifstyle change with low sodium diet and exercise   Follow up in 2-3 weeks        Relevant Medications   olmesartan  (BENICAR ) 20 MG tablet     Other   Iritis, recurrent   New worse dry mouth at night  Asked about possible sjogren's ?  She will bring up with her rheumatologist   May more likely be med side effect      Dry mouth   Worse in past several months  Discussed possible med side effects ,  zyrtec, metoprolol , chlorthalidone  , humira or methotrexate , also uses cpap (with humidified air)  History of iritis-has never been dx with sjogren's but that can be d/w rheum  as well  Instructed pt to hold zyrtec to start and report back

## 2023-05-05 ENCOUNTER — Ambulatory Visit (INDEPENDENT_AMBULATORY_CARE_PROVIDER_SITE_OTHER): Admitting: Otolaryngology

## 2023-05-05 VITALS — Ht 67.5 in | Wt 177.0 lb

## 2023-05-05 DIAGNOSIS — J343 Hypertrophy of nasal turbinates: Secondary | ICD-10-CM

## 2023-05-05 DIAGNOSIS — R0981 Nasal congestion: Secondary | ICD-10-CM | POA: Diagnosis not present

## 2023-05-05 DIAGNOSIS — J342 Deviated nasal septum: Secondary | ICD-10-CM

## 2023-05-05 DIAGNOSIS — J310 Chronic rhinitis: Secondary | ICD-10-CM

## 2023-05-05 DIAGNOSIS — H6982 Other specified disorders of Eustachian tube, left ear: Secondary | ICD-10-CM | POA: Diagnosis not present

## 2023-05-06 ENCOUNTER — Encounter: Payer: Self-pay | Admitting: Family Medicine

## 2023-05-06 DIAGNOSIS — G4733 Obstructive sleep apnea (adult) (pediatric): Secondary | ICD-10-CM | POA: Diagnosis not present

## 2023-05-06 NOTE — Progress Notes (Signed)
 Patient ID: [name], female   DOB: March 08, 1965, 58 y.o.   MRN: [id number]  Follow-up: Left eustachian tube dysfunction, left middle ear effusion  HPI: The patient is a 58 year old female who returns today for follow-up evaluation.  The patient has a history of left ear eustachian tube dysfunction, left middle ear effusion, and left ear conductive hearing loss.  She was also noted to have nasal mucosal congestion, nasal septal deviation, and bilateral inferior turbinate hypertrophy.  She was treated with Flonase nasal spray and Valsalva exercise.  At her last visit in March 2025, her eustachian tube dysfunction has clinically improved.  Her left middle ear effusion had resolved.  The patient was continue on her nasal spray.  She returns today reporting continuing improvement in her left ear symptoms.  Currently she denies any otalgia, otorrhea, or vertigo.  She has noted only occasional clogging sensation in the left ear.  Exam: General: Communicates without difficulty, well nourished, no acute distress. Head: Normocephalic, no evidence injury, no tenderness, facial buttresses intact without stepoff. Face/sinus: No tenderness to palpation and percussion. Facial movement is normal and symmetric. Eyes: PERRL, EOMI. No scleral icterus, conjunctivae clear. Neuro: CN II exam reveals vision grossly intact.  No nystagmus at any point of gaze. Ears: Auricles well formed without lesions.  Ear canals are intact without mass or lesion.  No erythema or edema is appreciated.  The TMs are intact without fluid. Nose: External evaluation reveals normal support and skin without lesions.  Dorsum is intact.  Anterior rhinoscopy reveals congested mucosa over anterior aspect of inferior turbinates and deviated septum.  No purulence noted. Oral:  Oral cavity and oropharynx are intact, symmetric, without erythema or edema.  Mucosa is moist without lesions. Neck: Full range of motion without pain.  There is no significant  lymphadenopathy.  No masses palpable.  Thyroid  bed within normal limits to palpation.  Parotid glands and submandibular glands equal bilaterally without mass.  Trachea is midline. Neuro:  CN 2-12 grossly intact.     Assessment: 1.  Clinically improved left ear eustachian tube dysfunction. 2.  No middle ear effusion is noted today. 3.  Chronic rhinitis with nasal mucosal congestion, nasal septal deviation, and bilateral inferior turbinate hypertrophy.   Plan: 1.  The physical exam findings are reviewed with the patient. 2.  Continue with Flonase nasal spray 2 sprays each nostril daily. 3.  Valsalva exercise as needed. 4.  The patient will return for reevaluation in 3 to 4 months.

## 2023-05-07 DIAGNOSIS — G4733 Obstructive sleep apnea (adult) (pediatric): Secondary | ICD-10-CM | POA: Diagnosis not present

## 2023-05-12 ENCOUNTER — Encounter (HOSPITAL_COMMUNITY): Payer: Self-pay

## 2023-05-19 ENCOUNTER — Ambulatory Visit (INDEPENDENT_AMBULATORY_CARE_PROVIDER_SITE_OTHER): Admitting: Family Medicine

## 2023-05-19 ENCOUNTER — Encounter: Payer: Self-pay | Admitting: Family Medicine

## 2023-05-19 VITALS — BP 141/79 | HR 82 | Temp 98.6°F | Ht 67.5 in | Wt 179.0 lb

## 2023-05-19 DIAGNOSIS — I10 Essential (primary) hypertension: Secondary | ICD-10-CM | POA: Diagnosis not present

## 2023-05-19 DIAGNOSIS — K143 Hypertrophy of tongue papillae: Secondary | ICD-10-CM | POA: Diagnosis not present

## 2023-05-19 DIAGNOSIS — R682 Dry mouth, unspecified: Secondary | ICD-10-CM | POA: Diagnosis not present

## 2023-05-19 MED ORDER — METOPROLOL TARTRATE 100 MG PO TABS: 100.0000 mg | ORAL_TABLET | Freq: Two times a day (BID) | ORAL | 0 refills | Status: DC

## 2023-05-19 MED ORDER — OLMESARTAN MEDOXOMIL 20 MG PO TABS: 20.0000 mg | ORAL_TABLET | Freq: Every day | ORAL | 0 refills | Status: DC

## 2023-05-19 NOTE — Patient Instructions (Addendum)
 Go up on your metoprolol  to 100 mg twice daily (you take 50 mg twice daily now)  Alert us  if side effects  Blood pressure below 9050  Pulse below 50   Continue other medicines If not improved blood pressure next time we may increase the olmesartan    Talk to your rheumatologist about tongue and dry mouth   El lysine over the counter helps mouth ulcers-unsure if it would help this tongue lesion  You can also see your dentist and talk about it   Avoid very salty foods and very acidic foods and beverages for mouth and tongue    Follow up in 2-4 weeks  Check blood pressure at home when you get a chance

## 2023-05-19 NOTE — Assessment & Plan Note (Signed)
 Worse in past several months  Discussed possible med side effects ,  zyrtec, metoprolol , chlorthalidone  , humira or methotrexate , also uses cpap (with humidified air)  History of iritis-has never been dx with sjogren's but that can be d/w rheum as well  No change today  Holding zyrtec did not improve it  Will follow up with rheumatology   ? If possible sjogren's candidate ?

## 2023-05-19 NOTE — Assessment & Plan Note (Signed)
 One enlarged/inflammed papillae on mid tongue (2 days) Encouraged to avoid trauma/ acidic or salty foods  Follow up if no improvement

## 2023-05-19 NOTE — Progress Notes (Signed)
 Subjective:    Patient ID: [name], female    DOB: [birth year], 58 y.o.   MRN: [id number]  HPI  Wt Readings from Last 3 Encounters:  05/19/23 179 lb (81.2 kg)  05/05/23 177 lb (80.3 kg)  04/26/23 177 lb 4 oz (80.4 kg)   27.62 kg/m  Vitals:   05/19/23 1357 05/19/23 1416  BP: (!) 146/88 (!) 141/79  Pulse: 82   Temp: 98.6 F (37 C)   SpO2: 97%      Here for follow up of HTN  Dry mouth continues Spot on tongue=sore at times/ gets these frequently / like a swollen taste bud   bp is stable today  No cp or palpitations or headaches or edema  No side effects to medicines  BP Readings from Last 3 Encounters:  05/19/23 (!) 141/79  04/26/23 (!) 141/80  04/16/23 136/70    Pulse Readings from Last 3 Encounters:  05/19/23 82  04/26/23 86  04/16/23 83    Metoprolol  50 mg bid (prevention on carvedilol  and blood pressure was better on that, was changed for better rate control with a fib)  Chlorthalidone  25 mg daily   We added olmesartan  20 mg daily   (in past losartan  caused cough-hoping this would not)  Tolerating it  Checked blood pressure once -not as high as it was   In past amlodipine  caused pedal edema   Stopped zyrtec No improvement in dry mouth Encouraged to bring this up with provider treating her iritis  She has appointment early June      Lab Results  Component Value Date   NA 138 03/24/2023   K 3.8 03/24/2023   CO2 23 03/24/2023   GLUCOSE 129 (H) 03/24/2023   BUN 12 03/24/2023   CREATININE 0.81 03/24/2023   CALCIUM  9.0 03/24/2023   GFR 71.15 09/21/2022   GFRNONAA >60 03/24/2023   Lab Results  Component Value Date   ALT 17 09/21/2022   AST 45 (H) 09/21/2022   ALKPHOS 68 09/21/2022   BILITOT 0.7 09/21/2022     Patient Active Problem List   Diagnosis Date Noted   Tongue papillae hypertrophy 05/19/2023   Dry mouth 04/26/2023   Cat bite 04/14/2023   Cough with fever 03/31/2023   Atrial fibrillation with RVR (HCC) 03/23/2023    Conductive hearing loss in left ear 02/02/2023   Left chronic serous otitis media 02/02/2023   Hypertrophy of nasal turbinates 02/02/2023   Other specified disorders of eustachian tube, left ear 02/02/2023   Hearing loss 11/30/2022   Current use of proton pump inhibitor 09/20/2022   Piriformis muscle pain 07/29/2022   Aortic atherosclerosis (HCC) 03/25/2022   Fractures, stress 12/26/2021   Immunosuppressed status (HCC) 11/19/2021   Encounter for screening mammogram for breast cancer 09/24/2021   Osteoporosis 09/24/2021   Estrogen deficiency 09/01/2021   History of DVT (deep vein thrombosis) 07/11/2021   Sleep apnea 05/14/2021   Hyperlipidemia associated with type 2 diabetes mellitus (HCC) 07/10/2019   Cystoid macular edema of both eyes 06/16/2019   Vitamin D  deficiency 06/16/2019   Diabetes mellitus treated with injections of non-insulin  medication (HCC) 06/06/2019   Peripheral edema 10/30/2017   Chronic rhinitis 08/10/2017   Welcome to Medicare preventive visit 04/20/2017   Chronic low back pain 12/09/2015   Degeneration of lumbar or lumbosacral intervertebral disc 12/09/2015   Hypothyroid 01/24/2014   Pseudophakia of both eyes 12/15/2013   Deviated nasal septum 01/03/2013   Encounter for Medicare annual wellness exam 11/02/2012  Hydradenitis 11/02/2012   Cystoid macular degeneration of retina 05/13/2012   Macular hole 03/23/2012   Screening-pulmonary TB 03/23/2012   Routine general medical examination at a health care facility 10/02/2011   Iritis, recurrent 10/02/2011   Tachycardia 10/02/2011   ADD (attention deficit disorder) 08/20/2010   DYSHIDROTIC ECZEMA, HANDS 11/12/2009   Obstructive sleep apnea 11/19/2006   Depression with anxiety 09/29/2006   Essential hypertension 09/29/2006   Seasonal and perennial allergic rhinitis 09/29/2006   Asthma, mild intermittent, well-controlled 09/29/2006   GERD 09/29/2006   IBS 09/29/2006   ENDOMETRIOSIS 09/29/2006   FEMALE  INFERTILITY 09/29/2006   Fibromyalgia 09/29/2006   INSOMNIA 09/29/2006   Past Medical History:  Diagnosis Date   ADHD (attention deficit hyperactivity disorder)    ADD - no meds   Allergy     allergic rhinitis   Anxiety    Asthma    Cataract 2015?   Already had them removed   Chronic headaches    Degenerative disc disease    Depression    Diabetes mellitus without complication (HCC)    type 2   Eczema    no current problems as of 07/09/21   Fatty liver    Fibromyalgia    Fx of fibula 2009   GERD (gastroesophageal reflux disease)    Hemorrhoids    Hypertension    Hyperthyroidism    Hypothyroidism    IBS (irritable bowel syndrome)    Iritis, chronic    Macular hole of left eye 06/2017   PONV (postoperative nausea and vomiting)    Right leg DVT (HCC)    Sleep apnea    uses CPAP nightly   Vitamin D  insufficiency 06/16/2019   Past Surgical History:  Procedure Laterality Date   ABDOMINAL HYSTERECTOMY     ANTERIOR CRUCIATE LIGAMENT REPAIR Left 1996   small tear of ACL   BREAST EXCISIONAL BIOPSY Left 1995   BREAST SURGERY     left nipple removed - benign tumor   BUNIONECTOMY Bilateral    COLONOSCOPY     EYE SURGERY     cataracts removed, macular hole repaired left eye- both surgeries [facility]   FRACTURE SURGERY  July 2023   [place] plateau   LAPAROSCOPY  08/2004   endometriosis - several    ORIF TIBIA FRACTURE Right 07/10/2021   Procedure: OPEN REDUCTION INTERNAL FIXATION (ORIF) TIBIAL PLATEAU FRACTURE;  Surgeon: Hardy Lia, MD;  Location: [facility] OR;  Service: Orthopedics;  Laterality: Right;   WISDOM TOOTH EXTRACTION     Social History   Tobacco Use   Smoking status: Never   Smokeless tobacco: Never  Vaping Use   Vaping status: Never Used  Substance Use Topics   Alcohol use: Never   Drug use: Never   Family History  Problem Relation Age of Onset   Hypertension Mother    Thyroid  disease Mother    Fibromyalgia Mother    Colon polyps Mother    Arthritis  Mother    Diabetes Mother    Diabetes Father    Stroke Father    Kidney cancer Maternal Grandmother    Colon cancer Neg Hx    Esophageal cancer Neg Hx    Stomach cancer Neg Hx    Rectal cancer Neg Hx    Allergic rhinitis Neg Hx    Angioedema Neg Hx    Asthma Neg Hx    Atopy Neg Hx    Eczema Neg Hx    Immunodeficiency Neg Hx    Urticaria Neg  Hx    Breast cancer Neg Hx    Allergies  Allergen Reactions   Amlodipine      Pedal edema    Cetirizine Hcl Other (See Comments)    Not effective   Erythromycin Nausea Only and Other (See Comments)    Caused a stomach ache   Escitalopram Oxalate Other (See Comments)    "No improvement"   Lisinopril Cough   Loratadine Other (See Comments)    "Not effective"   Nifedipine Other (See Comments)    "Fatigue and tremors"   Sertraline Hcl Other (See Comments)    "Did not help"   Vyvanse  [Lisdexamfetamine Dimesylate ]     Headache and excitablility   Current Outpatient Medications on File Prior to Visit  Medication Sig Dispense Refill   albuterol  (PROVENTIL ) (2.5 MG/3ML) 0.083% nebulizer solution Take 3 mLs (2.5 mg total) by nebulization every 4 (four) hours as needed for wheezing or shortness of breath. 75 mL 1   albuterol  (VENTOLIN  HFA) 108 (90 Base) MCG/ACT inhaler INHALE 2 PUFFS BY MOUTH EVERY 6 HOURS AS NEEDED FOR WHEEZING OR SHORTNESS OF BREATH 18 g 0   amphetamine -dextroamphetamine  (ADDERALL) 20 MG tablet Take 1 tablet (20 mg total) by mouth 2 (two) times daily. 60 tablet 0   apixaban  (ELIQUIS ) 5 MG TABS tablet Take 1 tablet (5 mg total) by mouth 2 (two) times daily. 60 tablet 11   benzonatate  (TESSALON ) 200 MG capsule Take 1 capsule (200 mg total) by mouth 3 (three) times daily as needed for cough. Swallow whole 30 capsule 1   budesonide -formoterol  (SYMBICORT ) 160-4.5 MCG/ACT inhaler Inhale 2 puffs into the lungs 2 (two) times daily as needed. 1 each 5   buPROPion  (WELLBUTRIN  XL) 300 MG 24 hr tablet Take 1 tablet by mouth once daily 90  tablet 3   CALCIUM -VITAMIN D  PO Take 1 capsule by mouth in the morning and at bedtime.     cetirizine (ZYRTEC) 10 MG tablet Take 10 mg by mouth daily as needed for allergies or rhinitis.     chlorthalidone  (HYGROTON ) 25 MG tablet Take 1 tablet by mouth once daily 90 tablet 0   esomeprazole  (NEXIUM ) 20 MG capsule Take 1 capsule by mouth at bedtime 90 capsule 0   folic acid  (FOLVITE ) 1 MG tablet Take 3 mg by mouth See admin instructions. Take 3 mg by mouth once a day on Wed/Thurs/Fri/Sat     HUMIRA PEN 40 MG/0.8ML PNKT Inject 40 mg into the skin every Monday.     ketorolac  (ACULAR ) 0.5 % ophthalmic solution Place 1 drop into the left eye 2 (two) times daily.     leucovorin (WELLCOVORIN) 5 MG tablet Take 5 mg by mouth See admin instructions. Take 5 mg by mouth once a day on only Sundays and Tuesdays     MAGNESIUM  GLYCINATE PO Take 1 capsule by mouth at bedtime.     metFORMIN  (GLUCOPHAGE ) 1000 MG tablet TAKE 1 TABLET BY MOUTH TWICE DAILY WITH A MEAL 180 tablet 0   methocarbamol  (ROBAXIN ) 500 MG tablet Take 1 tablet (500 mg total) by mouth every 8 (eight) hours as needed for muscle spasms (back pain). Caution of sedation 30 tablet 3   Methotrexate 25 MG/ML SOSY Inject 17.5 mg into the skin every Monday.     prednisoLONE  acetate (PRED FORTE ) 1 % ophthalmic suspension Place 4 drops into the left eye 2 (two) times daily.     rosuvastatin  (CRESTOR ) 5 MG tablet Take 1 tablet (5 mg total) by mouth daily. 90  tablet 0   Semaglutide , 1 MG/DOSE, 4 MG/3ML SOPN Inject 1 mg as directed once a week. 3 mL 3   traMADol  (ULTRAM ) 50 MG tablet Take 1 tablet (50 mg total) by mouth every 6 (six) hours as needed for severe pain (for fibromyalgia flares and back pain). 60 tablet 0   No current facility-administered medications on file prior to visit.    Review of Systems  Constitutional:  Negative for activity change, appetite change, fatigue, fever and unexpected weight change.  HENT:  Negative for congestion, ear pain,  rhinorrhea, sinus pressure and sore throat.        Baseline dry mouth  Has bump on tongue (not unusual for her)   Eyes:  Negative for pain, redness and visual disturbance.  Respiratory:  Negative for cough, shortness of breath and wheezing.   Cardiovascular:  Negative for chest pain and palpitations.  Gastrointestinal:  Negative for abdominal pain, blood in stool, constipation and diarrhea.  Endocrine: Negative for polydipsia and polyuria.  Genitourinary:  Negative for dysuria, frequency and urgency.  Musculoskeletal:  Positive for arthralgias. Negative for back pain and myalgias.  Skin:  Negative for pallor and rash.  Allergic/Immunologic: Negative for environmental allergies.  Neurological:  Negative for dizziness, syncope and headaches.  Hematological:  Negative for adenopathy. Does not bruise/bleed easily.  Psychiatric/Behavioral:  Negative for decreased concentration and dysphoric mood. The patient is not nervous/anxious.        Objective:   Physical Exam Constitutional:      General: She is not in acute distress.    Appearance: Normal appearance. She is well-developed and normal weight. She is not ill-appearing or diaphoretic.  HENT:     Head: Normocephalic and atraumatic.     Mouth/Throat:     Mouth: Mucous membranes are moist.     Pharynx: No oropharyngeal exudate or posterior oropharyngeal erythema.     Comments: One enlarged/inflamed papillae on mid tongue   Eyes:     General: No scleral icterus.       Right eye: No discharge.        Left eye: No discharge.     Conjunctiva/sclera: Conjunctivae normal.     Pupils: Pupils are equal, round, and reactive to light.  Neck:     Thyroid : No thyromegaly.     Vascular: No carotid bruit or JVD.  Cardiovascular:     Rate and Rhythm: Normal rate and regular rhythm.     Heart sounds: Normal heart sounds.     No gallop.  Pulmonary:     Effort: Pulmonary effort is normal. No respiratory distress.     Breath sounds: Normal  breath sounds. No wheezing or rales.  Abdominal:     General: There is no distension or abdominal bruit.     Palpations: Abdomen is soft.  Musculoskeletal:     Cervical back: Normal range of motion and neck supple.     Right lower leg: No edema.     Left lower leg: No edema.  Lymphadenopathy:     Cervical: No cervical adenopathy.  Skin:    General: Skin is warm and dry.     Coloration: Skin is not pale.     Findings: No rash.  Neurological:     Mental Status: She is alert.     Coordination: Coordination normal.     Deep Tendon Reflexes: Reflexes are normal and symmetric. Reflexes normal.  Psychiatric:        Mood and Affect: Mood normal.  Assessment & Plan:   Problem List Items Addressed This Visit       Cardiovascular and Mediastinum   Essential hypertension - Primary   Blood pressure is still not at boal  BP Readings from Last 1 Encounters:  05/19/23 (!) 141/79   No changes needed Most recent labs reviewed  Disc lifstyle change with low sodium diet and exercise   Taking Chlorthalidone  25 mg daily  Olmesartan  20 mg daily   Will increase metoprolol  to 100 mg bid watching for low blood pressure or pulse  Follow up planned for visit and labs       Relevant Medications   metoprolol  tartrate (LOPRESSOR ) 100 MG tablet   olmesartan  (BENICAR ) 20 MG tablet     Digestive   Tongue papillae hypertrophy   One enlarged/inflammed papillae on mid tongue (2 days) Encouraged to avoid trauma/ acidic or salty foods  Follow up if no improvement         Other   Dry mouth   Worse in past several months  Discussed possible med side effects ,  zyrtec, metoprolol , chlorthalidone  , humira or methotrexate , also uses cpap (with humidified air)  History of iritis-has never been dx with sjogren's but that can be d/w rheum as well  No change today  Holding zyrtec did not improve it  Will follow up with rheumatology   ? If possible sjogren's candidate ?

## 2023-05-19 NOTE — Assessment & Plan Note (Signed)
 Blood pressure is still not at boal  BP Readings from Last 1 Encounters:  05/19/23 (!) 141/79   No changes needed Most recent labs reviewed  Disc lifstyle change with low sodium diet and exercise   Taking Chlorthalidone  25 mg daily  Olmesartan  20 mg daily   Will increase metoprolol  to 100 mg bid watching for low blood pressure or pulse  Follow up planned for visit and labs

## 2023-06-01 ENCOUNTER — Ambulatory Visit: Payer: Medicare HMO | Admitting: Family Medicine

## 2023-06-02 ENCOUNTER — Telehealth: Payer: Self-pay | Admitting: Pharmacy Technician

## 2023-06-02 NOTE — Telephone Encounter (Signed)
 PAP: Application for Eliquis has been submitted to [employer] ([employer]), via fax

## 2023-06-07 ENCOUNTER — Ambulatory Visit (INDEPENDENT_AMBULATORY_CARE_PROVIDER_SITE_OTHER): Payer: Medicare HMO | Admitting: Family Medicine

## 2023-06-07 ENCOUNTER — Encounter: Payer: Self-pay | Admitting: Family Medicine

## 2023-06-07 VITALS — BP 119/75 | HR 79 | Temp 98.3°F | Ht 67.5 in | Wt 177.4 lb

## 2023-06-07 DIAGNOSIS — I10 Essential (primary) hypertension: Secondary | ICD-10-CM | POA: Diagnosis not present

## 2023-06-07 DIAGNOSIS — E1169 Type 2 diabetes mellitus with other specified complication: Secondary | ICD-10-CM | POA: Diagnosis not present

## 2023-06-07 DIAGNOSIS — Z79899 Other long term (current) drug therapy: Secondary | ICD-10-CM

## 2023-06-07 DIAGNOSIS — Z7985 Long-term (current) use of injectable non-insulin antidiabetic drugs: Secondary | ICD-10-CM

## 2023-06-07 DIAGNOSIS — E039 Hypothyroidism, unspecified: Secondary | ICD-10-CM | POA: Diagnosis not present

## 2023-06-07 DIAGNOSIS — E1165 Type 2 diabetes mellitus with hyperglycemia: Secondary | ICD-10-CM

## 2023-06-07 DIAGNOSIS — M797 Fibromyalgia: Secondary | ICD-10-CM

## 2023-06-07 DIAGNOSIS — E785 Hyperlipidemia, unspecified: Secondary | ICD-10-CM | POA: Diagnosis not present

## 2023-06-07 DIAGNOSIS — E559 Vitamin D deficiency, unspecified: Secondary | ICD-10-CM

## 2023-06-07 DIAGNOSIS — E119 Type 2 diabetes mellitus without complications: Secondary | ICD-10-CM | POA: Diagnosis not present

## 2023-06-07 DIAGNOSIS — G4733 Obstructive sleep apnea (adult) (pediatric): Secondary | ICD-10-CM | POA: Diagnosis not present

## 2023-06-07 DIAGNOSIS — K219 Gastro-esophageal reflux disease without esophagitis: Secondary | ICD-10-CM

## 2023-06-07 MED ORDER — TRAMADOL HCL 50 MG PO TABS: 50.0000 mg | ORAL_TABLET | Freq: Four times a day (QID) | ORAL | 0 refills | Status: AC | PRN

## 2023-06-07 MED ORDER — METHOCARBAMOL 500 MG PO TABS: 500.0000 mg | ORAL_TABLET | Freq: Three times a day (TID) | ORAL | 3 refills | Status: AC | PRN

## 2023-06-07 NOTE — Assessment & Plan Note (Signed)
 bp in fair control at this time  BP Readings from Last 1 Encounters:  06/07/23 119/75   No changes needed Most recent labs reviewed  Disc lifstyle change with low sodium diet and exercise   Chlorthalidone  25 mg daily  Olmesartan  20 mg daily  Metoprolol  100 mg bid-tolerating well

## 2023-06-07 NOTE — Assessment & Plan Note (Signed)
 Hypothyroidism -subclinical  Pt has no clinical changes No change in energy level/ hair or skin/ edema and no tremor Lab Results  Component Value Date   TSH 5.100 (H) 03/23/2023    Off levothyroxine   Prefers to be off medication

## 2023-06-07 NOTE — Progress Notes (Signed)
 Subjective:    Patient ID: [name], female    DOB: Jul 05, 1965, 58 y.o.   MRN: [id number]  HPI  Wt Readings from Last 3 Encounters:  06/07/23 177 lb 6 oz (80.5 kg)  05/19/23 179 lb (81.2 kg)  05/05/23 177 lb (80.3 kg)   27.37 kg/m  Vitals:   06/07/23 0757 06/07/23 0814  BP: 138/86 119/75  Pulse: 79   Temp: 98.3 F (36.8 C)   SpO2: 97%     Pt presents for follow up of HTN and DM2 and chronic medical problems   HTN bp is stable today  No cp or palpitations or headaches or edema  No side effects to medicines  BP Readings from Last 3 Encounters:  06/07/23 119/75  05/19/23 (!) 141/79  04/26/23 (!) 141/80     Chlorthalidone  25 mg daily  Olmesartan  20 mg daily  Last visit increased metoprolol  to 100 mg bid (no problems)   Pulse Readings from Last 3 Encounters:  06/07/23 79  05/19/23 82  04/26/23 86   DM2 Lab Results  Component Value Date   HGBA1C 7.0 (H) 03/23/2023   HGBA1C 6.4 (A) 11/30/2022   HGBA1C 8.9 (A) 08/18/2022   Metformin  1000 mg bid  Semaglutide  1 mg weekly   Dry mouth is not as bad  Goes to [facility] on Friday    Hyperlipidemia Lab Results  Component Value Date   CHOL 118 09/21/2022   HDL 52.70 09/21/2022   LDLCALC 40 09/21/2022   TRIG 126.0 09/21/2022   CHOLHDL 2 09/21/2022   Crestor  5 mg daily   Hypothyroidism  Pt has no clinical changes No change in energy level/ hair or skin/ edema and no tremor Lab Results  Component Value Date   TSH 5.100 (H) 03/23/2023    Subclinical Pt stopped levothyroxine  25 mcg   Needs refill  Tramadol   Methocarnamol    Patient Active Problem List   Diagnosis Date Noted   Tongue papillae hypertrophy 05/19/2023   Dry mouth 04/26/2023   Cat bite 04/14/2023   Cough with fever 03/31/2023   Atrial fibrillation with RVR (HCC) 03/23/2023   Conductive hearing loss in left ear 02/02/2023   Left chronic serous otitis media 02/02/2023   Hypertrophy of nasal turbinates 02/02/2023   Other specified  disorders of eustachian tube, left ear 02/02/2023   Hearing loss 11/30/2022   Current use of proton pump inhibitor 09/20/2022   Piriformis muscle pain 07/29/2022   Aortic atherosclerosis (HCC) 03/25/2022   Fractures, stress 12/26/2021   Immunosuppressed status (HCC) 11/19/2021   Encounter for screening mammogram for breast cancer 09/24/2021   Osteoporosis 09/24/2021   Estrogen deficiency 09/01/2021   History of DVT (deep vein thrombosis) 07/11/2021   Sleep apnea 05/14/2021   Hyperlipidemia associated with type 2 diabetes mellitus (HCC) 07/10/2019   Cystoid macular edema of both eyes 06/16/2019   Vitamin D  deficiency 06/16/2019   Diabetes mellitus treated with injections of non-insulin  medication (HCC) 06/06/2019   Peripheral edema 10/30/2017   Chronic rhinitis 08/10/2017   Welcome to Medicare preventive visit 04/20/2017   Chronic low back pain 12/09/2015   Degeneration of lumbar or lumbosacral intervertebral disc 12/09/2015   Hypothyroid 01/24/2014   Pseudophakia of both eyes 12/15/2013   Deviated nasal septum 01/03/2013   Encounter for Medicare annual wellness exam 11/02/2012   Hydradenitis 11/02/2012   Cystoid macular degeneration of retina 05/13/2012   Macular hole 03/23/2012   Screening-pulmonary TB 03/23/2012   Routine general medical examination at a health  care facility 10/02/2011   Iritis, recurrent 10/02/2011   Tachycardia 10/02/2011   ADD (attention deficit disorder) 08/20/2010   DYSHIDROTIC ECZEMA, HANDS 11/12/2009   Obstructive sleep apnea 11/19/2006   Depression with anxiety 09/29/2006   Essential hypertension 09/29/2006   Seasonal and perennial allergic rhinitis 09/29/2006   Asthma, mild intermittent, well-controlled 09/29/2006   GERD 09/29/2006   IBS 09/29/2006   ENDOMETRIOSIS 09/29/2006   FEMALE INFERTILITY 09/29/2006   Fibromyalgia 09/29/2006   INSOMNIA 09/29/2006   Past Medical History:  Diagnosis Date   ADHD (attention deficit hyperactivity disorder)     ADD - no meds   Allergy     allergic rhinitis   Anxiety    Asthma    Cataract 2015?   Already had them removed   Chronic headaches    Degenerative disc disease    Depression    Diabetes mellitus without complication (HCC)    type 2   Eczema    no current problems as of 07/09/21   Fatty liver    Fibromyalgia    Fx of fibula 2009   GERD (gastroesophageal reflux disease)    Hemorrhoids    Hypertension    Hyperthyroidism    Hypothyroidism    IBS (irritable bowel syndrome)    Iritis, chronic    Macular hole of left eye 06/2017   PONV (postoperative nausea and vomiting)    Right leg DVT (HCC)    Sleep apnea    uses CPAP nightly   Vitamin D  insufficiency 06/16/2019   Past Surgical History:  Procedure Laterality Date   ABDOMINAL HYSTERECTOMY     ANTERIOR CRUCIATE LIGAMENT REPAIR Left 1996   small tear of ACL   BREAST EXCISIONAL BIOPSY Left 1995   BREAST SURGERY     left nipple removed - benign tumor   BUNIONECTOMY Bilateral    COLONOSCOPY     EYE SURGERY     cataracts removed, macular hole repaired left eye- both surgeries done at [facility]   FRACTURE SURGERY  July 2023   [place] plateau   LAPAROSCOPY  08/2004   endometriosis - several    ORIF TIBIA FRACTURE Right 07/10/2021   Procedure: OPEN REDUCTION INTERNAL FIXATION (ORIF) TIBIAL PLATEAU FRACTURE;  Surgeon: Hardy Lia, MD;  Location: [facility] OR;  Service: Orthopedics;  Laterality: Right;   WISDOM TOOTH EXTRACTION     Social History   Tobacco Use   Smoking status: Never   Smokeless tobacco: Never  Vaping Use   Vaping status: Never Used  Substance Use Topics   Alcohol use: Never   Drug use: Never   Family History  Problem Relation Age of Onset   Hypertension Mother    Thyroid  disease Mother    Fibromyalgia Mother    Colon polyps Mother    Arthritis Mother    Diabetes Mother    Diabetes Father    Stroke Father    Kidney cancer Maternal Grandmother    Colon cancer Neg Hx    Esophageal cancer Neg Hx     Stomach cancer Neg Hx    Rectal cancer Neg Hx    Allergic rhinitis Neg Hx    Angioedema Neg Hx    Asthma Neg Hx    Atopy Neg Hx    Eczema Neg Hx    Immunodeficiency Neg Hx    Urticaria Neg Hx    Breast cancer Neg Hx    Allergies  Allergen Reactions   Amlodipine      Pedal edema    Cetirizine Hcl  Other (See Comments)    Not effective   Erythromycin Nausea Only and Other (See Comments)    Caused a stomach ache   Escitalopram Oxalate Other (See Comments)    "No improvement"   Lisinopril Cough   Loratadine Other (See Comments)    "Not effective"   Nifedipine Other (See Comments)    "Fatigue and tremors"   Sertraline Hcl Other (See Comments)    "Did not help"   Vyvanse  [Lisdexamfetamine Dimesylate ]     Headache and excitablility   Current Outpatient Medications on File Prior to Visit  Medication Sig Dispense Refill   albuterol  (PROVENTIL ) (2.5 MG/3ML) 0.083% nebulizer solution Take 3 mLs (2.5 mg total) by nebulization every 4 (four) hours as needed for wheezing or shortness of breath. 75 mL 1   albuterol  (VENTOLIN  HFA) 108 (90 Base) MCG/ACT inhaler INHALE 2 PUFFS BY MOUTH EVERY 6 HOURS AS NEEDED FOR WHEEZING OR SHORTNESS OF BREATH 18 g 0   amphetamine -dextroamphetamine  (ADDERALL) 20 MG tablet Take 1 tablet (20 mg total) by mouth 2 (two) times daily. 60 tablet 0   apixaban  (ELIQUIS ) 5 MG TABS tablet Take 1 tablet (5 mg total) by mouth 2 (two) times daily. 60 tablet 11   benzonatate  (TESSALON ) 200 MG capsule Take 1 capsule (200 mg total) by mouth 3 (three) times daily as needed for cough. Swallow whole 30 capsule 1   budesonide -formoterol  (SYMBICORT ) 160-4.5 MCG/ACT inhaler Inhale 2 puffs into the lungs 2 (two) times daily as needed. 1 each 5   buPROPion  (WELLBUTRIN  XL) 300 MG 24 hr tablet Take 1 tablet by mouth once daily 90 tablet 3   CALCIUM -VITAMIN D  PO Take 1 capsule by mouth in the morning and at bedtime.     cetirizine (ZYRTEC) 10 MG tablet Take 10 mg by mouth daily as needed  for allergies or rhinitis.     chlorthalidone  (HYGROTON ) 25 MG tablet Take 1 tablet by mouth once daily 90 tablet 0   esomeprazole  (NEXIUM ) 20 MG capsule Take 1 capsule by mouth at bedtime 90 capsule 0   folic acid  (FOLVITE ) 1 MG tablet Take 3 mg by mouth See admin instructions. Take 3 mg by mouth once a day on Wed/Thurs/Fri/Sat     HUMIRA PEN 40 MG/0.8ML PNKT Inject 40 mg into the skin every Monday.     ketorolac  (ACULAR ) 0.5 % ophthalmic solution Place 1 drop into the left eye 2 (two) times daily.     leucovorin (WELLCOVORIN) 5 MG tablet Take 5 mg by mouth See admin instructions. Take 5 mg by mouth once a day on only Sundays and Tuesdays     MAGNESIUM  GLYCINATE PO Take 1 capsule by mouth at bedtime.     metFORMIN  (GLUCOPHAGE ) 1000 MG tablet TAKE 1 TABLET BY MOUTH TWICE DAILY WITH A MEAL 180 tablet 0   Methotrexate 25 MG/ML SOSY Inject 17.5 mg into the skin every Monday.     metoprolol  tartrate (LOPRESSOR ) 100 MG tablet Take 1 tablet (100 mg total) by mouth 2 (two) times daily. 180 tablet 0   olmesartan  (BENICAR ) 20 MG tablet Take 1 tablet (20 mg total) by mouth daily. 90 tablet 0   prednisoLONE  acetate (PRED FORTE ) 1 % ophthalmic suspension Place 4 drops into the left eye 2 (two) times daily.     rosuvastatin  (CRESTOR ) 5 MG tablet Take 1 tablet (5 mg total) by mouth daily. 90 tablet 0   Semaglutide , 1 MG/DOSE, 4 MG/3ML SOPN Inject 1 mg as directed once a week. 3  mL 3   No current facility-administered medications on file prior to visit.    Review of Systems  Constitutional:  Negative for activity change, appetite change, fatigue, fever and unexpected weight change.  HENT:  Negative for congestion, ear pain, rhinorrhea, sinus pressure and sore throat.   Eyes:  Negative for pain, redness and visual disturbance.  Respiratory:  Negative for cough, shortness of breath and wheezing.   Cardiovascular:  Negative for chest pain and palpitations.  Gastrointestinal:  Negative for abdominal pain,  blood in stool, constipation and diarrhea.  Endocrine: Positive for polyphagia. Negative for polydipsia and polyuria.  Genitourinary:  Negative for dysuria, frequency and urgency.  Musculoskeletal:  Positive for arthralgias and myalgias. Negative for back pain.  Skin:  Negative for pallor and rash.  Allergic/Immunologic: Negative for environmental allergies.  Neurological:  Negative for dizziness, syncope and headaches.  Hematological:  Negative for adenopathy. Does not bruise/bleed easily.  Psychiatric/Behavioral:  Negative for decreased concentration and dysphoric mood. The patient is not nervous/anxious.        Objective:   Physical Exam Constitutional:      General: She is not in acute distress.    Appearance: Normal appearance. She is well-developed.     Comments: Over weight   HENT:     Head: Normocephalic and atraumatic.  Eyes:     General:        Right eye: No discharge.        Left eye: No discharge.     Conjunctiva/sclera: Conjunctivae normal.     Pupils: Pupils are equal, round, and reactive to light.  Neck:     Thyroid : No thyromegaly.     Vascular: No carotid bruit or JVD.  Cardiovascular:     Rate and Rhythm: Normal rate and regular rhythm.     Heart sounds: Normal heart sounds.     No gallop.  Pulmonary:     Effort: Pulmonary effort is normal. No respiratory distress.     Breath sounds: Normal breath sounds. No wheezing or rales.  Abdominal:     General: There is no distension or abdominal bruit.     Palpations: Abdomen is soft.  Musculoskeletal:     Cervical back: Normal range of motion and neck supple.     Right lower leg: No edema.     Left lower leg: No edema.  Lymphadenopathy:     Cervical: No cervical adenopathy.  Skin:    General: Skin is warm and dry.     Coloration: Skin is not pale.     Findings: No rash.  Neurological:     Mental Status: She is alert.     Coordination: Coordination normal.     Deep Tendon Reflexes: Reflexes are normal and  symmetric. Reflexes normal.  Psychiatric:        Mood and Affect: Mood normal.           Assessment & Plan:   Problem List Items Addressed This Visit       Cardiovascular and Mediastinum   Essential hypertension - Primary   bp in fair control at this time  BP Readings from Last 1 Encounters:  06/07/23 119/75   No changes needed Most recent labs reviewed  Disc lifstyle change with low sodium diet and exercise   Chlorthalidone  25 mg daily  Olmesartan  20 mg daily  Metoprolol  100 mg bid-tolerating well          Endocrine   Hypothyroid   Hypothyroidism -subclinical  Pt has no  clinical changes No change in energy level/ hair or skin/ edema and no tremor Lab Results  Component Value Date   TSH 5.100 (H) 03/23/2023    Off levothyroxine   Prefers to be off medication       Hyperlipidemia associated with type 2 diabetes mellitus (HCC)   Disc goals for lipids and reasons to control them Rev last labs with pt Rev low sat fat diet in detail  Crestor  5 mg daily   LLD Is 40- well controlled       Diabetes mellitus treated with injections of non-insulin  medication (HCC)   Lab Results  Component Value Date   HGBA1C 7.0 (H) 03/23/2023   HGBA1C 6.4 (A) 11/30/2022   HGBA1C 8.9 (A) 08/18/2022  Next A1c due after 6/18-ordered Metformin  1000 mg bid  Semaglutide  1 mg weekly (? Not helping cravings but a little less appetite) Perhaps mounjaro would be more affective? -pt will check on ins coverage and let us  know   Better diet         Other   Fibromyalgia   Refilled tramadol  and methocarbamol  today Tolerates well  Uses prn       Relevant Medications   methocarbamol  (ROBAXIN ) 500 MG tablet   traMADol  (ULTRAM ) 50 MG tablet   Other Visit Diagnoses       Type 2 diabetes mellitus with hyperglycemia, without long-term current use of insulin  (HCC)

## 2023-06-07 NOTE — Patient Instructions (Addendum)
 Check with your pharmacist to see if the generic for mounjaro is covered (or the name brand) and let me know  It may be worth a try  ? If would help cravings   Blood pressure is much better  Continue current medicines   Schedule labs after June 18 th   Take care of yourself

## 2023-06-07 NOTE — Assessment & Plan Note (Signed)
 Disc goals for lipids and reasons to control them Rev last labs with pt Rev low sat fat diet in detail  Crestor 5 mg daily   LLD Is 40- well controlled

## 2023-06-07 NOTE — Assessment & Plan Note (Signed)
 Refilled tramadol  and methocarbamol  today Tolerates well  Uses prn

## 2023-06-07 NOTE — Assessment & Plan Note (Signed)
 Lab Results  Component Value Date   HGBA1C 7.0 (H) 03/23/2023   HGBA1C 6.4 (A) 11/30/2022   HGBA1C 8.9 (A) 08/18/2022  Next A1c due after 6/18-ordered Metformin  1000 mg bid  Semaglutide  1 mg weekly (? Not helping cravings but a little less appetite) Perhaps mounjaro would be more affective? -pt will check on ins coverage and let us  know   Better diet

## 2023-06-08 ENCOUNTER — Other Ambulatory Visit (HOSPITAL_COMMUNITY): Payer: Self-pay

## 2023-06-08 NOTE — Telephone Encounter (Signed)
   Per test claims: Eliquis  is 152.71 for 1 month Xarelto  brand is 150.63 for 1 month Generic xarelto  is not on formulary Generic pradaxa is 59.27 for 1 month Warfarin is 2.00    Sent the patient a message

## 2023-06-09 ENCOUNTER — Ambulatory Visit: Admitting: Family Medicine

## 2023-06-09 NOTE — Telephone Encounter (Signed)
 Tried to call the patient and vm is full

## 2023-06-10 ENCOUNTER — Telehealth: Payer: Self-pay

## 2023-06-10 ENCOUNTER — Other Ambulatory Visit (HOSPITAL_COMMUNITY): Payer: Self-pay

## 2023-06-10 ENCOUNTER — Other Ambulatory Visit: Payer: Self-pay | Admitting: Family Medicine

## 2023-06-10 MED ORDER — DABIGATRAN ETEXILATE MESYLATE 150 MG PO CAPS: 150.0000 mg | ORAL_CAPSULE | Freq: Two times a day (BID) | ORAL | 5 refills | Status: DC

## 2023-06-10 NOTE — Telephone Encounter (Signed)
 Lmom for patient to call back

## 2023-06-10 NOTE — Telephone Encounter (Signed)
 Hi,  I spoke to the patient and they are asking if she can be changed to the generic pradaxa instead. The generic pradaxa would be around 59.27 for 1 month and that would be affordable. They said she has about 4 tablets left of eliquis . They said if that is agreeable, can a 30 day supply of the generic pradaxa be sent to their [facility] pharmacy? Thank you!

## 2023-06-10 NOTE — Telephone Encounter (Signed)
 Patient notified provider okay with switch to Pradaxa (generic) and was sent to [facility] per pt's request.    Patient had no further questions or concerns at this time.

## 2023-06-10 NOTE — Telephone Encounter (Signed)
 Pt was refer from [facility],pt needing pt's assistance on OZEMPIC , gave pt a call to see if pt wants the PAP to be mail out or if we can do it online,pt gave consent to do her application online, we submitted PAP today and have fax provider portion today.will follow up.

## 2023-06-11 DIAGNOSIS — H3023 Posterior cyclitis, bilateral: Secondary | ICD-10-CM | POA: Diagnosis not present

## 2023-06-11 DIAGNOSIS — F419 Anxiety disorder, unspecified: Secondary | ICD-10-CM | POA: Diagnosis not present

## 2023-06-11 DIAGNOSIS — H20023 Recurrent acute iridocyclitis, bilateral: Secondary | ICD-10-CM | POA: Diagnosis not present

## 2023-06-11 DIAGNOSIS — R682 Dry mouth, unspecified: Secondary | ICD-10-CM | POA: Diagnosis not present

## 2023-06-11 DIAGNOSIS — H35342 Macular cyst, hole, or pseudohole, left eye: Secondary | ICD-10-CM | POA: Diagnosis not present

## 2023-06-11 DIAGNOSIS — H35353 Cystoid macular degeneration, bilateral: Secondary | ICD-10-CM | POA: Diagnosis not present

## 2023-06-11 DIAGNOSIS — Z79899 Other long term (current) drug therapy: Secondary | ICD-10-CM | POA: Diagnosis not present

## 2023-06-11 DIAGNOSIS — I480 Paroxysmal atrial fibrillation: Secondary | ICD-10-CM | POA: Diagnosis not present

## 2023-06-15 NOTE — Telephone Encounter (Signed)
 Re sending PAP [employer] Ozempic  fax to provider office,still have not received provider portion.

## 2023-06-19 ENCOUNTER — Other Ambulatory Visit: Payer: Self-pay | Admitting: Family Medicine

## 2023-06-21 NOTE — Telephone Encounter (Signed)
 Last filled on 01/12/23 # 3 mL/ 3 refills   CPE scheduled on 09/30/23

## 2023-06-21 NOTE — Telephone Encounter (Signed)
 Does she want to go up on medicine one more time to 2 mg weekly?

## 2023-06-22 NOTE — Telephone Encounter (Signed)
 Received APPROVAL letter from [employer] on Ozempisc 1 mg approval letter has been index left a HIPAA VM at pt number.

## 2023-06-22 NOTE — Telephone Encounter (Signed)
 Pt called back she was approved for our pt assistance program so they will mail the 1 mg dose directly to our office she wants to stay on that dose since that's what was approved. Rx declined from [facility] since pt was approved through our pharmacy. She still has one shot left to use on Tuesday so she has time to get it. Rx declined from [facility] and she will await the med from our pharmacy   FYI to PCP

## 2023-06-22 NOTE — Telephone Encounter (Signed)
 Left VM requesting pt to call the office back

## 2023-06-29 ENCOUNTER — Other Ambulatory Visit (INDEPENDENT_AMBULATORY_CARE_PROVIDER_SITE_OTHER)

## 2023-06-29 ENCOUNTER — Ambulatory Visit: Payer: Self-pay | Admitting: Family Medicine

## 2023-06-29 ENCOUNTER — Telehealth: Payer: Self-pay | Admitting: Family Medicine

## 2023-06-29 DIAGNOSIS — E1169 Type 2 diabetes mellitus with other specified complication: Secondary | ICD-10-CM | POA: Diagnosis not present

## 2023-06-29 DIAGNOSIS — E119 Type 2 diabetes mellitus without complications: Secondary | ICD-10-CM

## 2023-06-29 DIAGNOSIS — E785 Hyperlipidemia, unspecified: Secondary | ICD-10-CM

## 2023-06-29 DIAGNOSIS — I10 Essential (primary) hypertension: Secondary | ICD-10-CM

## 2023-06-29 DIAGNOSIS — Z7985 Long-term (current) use of injectable non-insulin antidiabetic drugs: Secondary | ICD-10-CM | POA: Diagnosis not present

## 2023-06-29 LAB — COMPREHENSIVE METABOLIC PANEL WITH GFR
ALT: 8 U/L (ref 0–35)
AST: 19 U/L (ref 0–37)
Albumin: 4 g/dL (ref 3.5–5.2)
Alkaline Phosphatase: 73 U/L (ref 39–117)
BUN: 12 mg/dL (ref 6–23)
CO2: 30 meq/L (ref 19–32)
Calcium: 9.3 mg/dL (ref 8.4–10.5)
Chloride: 99 meq/L (ref 96–112)
Creatinine, Ser: 0.83 mg/dL (ref 0.40–1.20)
GFR: 79.03 mL/min (ref >60.00)
Glucose, Bld: 114 mg/dL — ABNORMAL HIGH (ref 70–99)
Potassium: 4.1 meq/L (ref 3.5–5.1)
Sodium: 136 meq/L (ref 135–145)
Total Bilirubin: 0.9 mg/dL (ref 0.2–1.2)
Total Protein: 7.2 g/dL (ref 6.0–8.3)

## 2023-06-29 LAB — LIPID PANEL
Cholesterol: 143 mg/dL (ref 0–200)
HDL: 52.3 mg/dL (ref >39.00)
LDL Cholesterol: 56 mg/dL (ref 0–99)
NonHDL: 90.29
Total CHOL/HDL Ratio: 3
Triglycerides: 170 mg/dL — ABNORMAL HIGH (ref 0.0–149.0)
VLDL: 34 mg/dL (ref 0.0–40.0)

## 2023-06-29 LAB — HEMOGLOBIN A1C: Hgb A1c MFr Bld: 7 % — ABNORMAL HIGH (ref 4.6–6.5)

## 2023-06-29 NOTE — Telephone Encounter (Signed)
 Is this something you can help with Manuelita?  Copied from CRM [id number]. Topic: General - Call Back - No Documentation >> Jun 29, 2023 12:28 PM [place] J wrote: Reason for CRM: Patient was referred from [facility], with needing patient assistance on OZEMPIC . She received a call but there wasn't a call back number she could reach out to. She would like if this message could be routed over to the proper department and if she could receive a call back.

## 2023-07-02 MED ORDER — SEMAGLUTIDE (1 MG/DOSE) 4 MG/3ML ~~LOC~~ SOPN: 1.0000 mg | PEN_INJECTOR | SUBCUTANEOUS | 0 refills | Status: DC

## 2023-07-02 MED ORDER — PANTOPRAZOLE SODIUM 40 MG PO TBEC: 40.0000 mg | DELAYED_RELEASE_TABLET | Freq: Every day | ORAL | 2 refills | Status: AC

## 2023-07-07 DIAGNOSIS — G4733 Obstructive sleep apnea (adult) (pediatric): Secondary | ICD-10-CM | POA: Diagnosis not present

## 2023-07-08 NOTE — Telephone Encounter (Signed)
 Received 4 pens of ozempic  4mg /12mL Lot #[id number] Exp date 11/04/25  Pt notified med here and ready for pick up. FYI back to pharmacy

## 2023-07-28 NOTE — Telephone Encounter (Signed)
 Patient picked up medication

## 2023-08-07 DIAGNOSIS — G4733 Obstructive sleep apnea (adult) (pediatric): Secondary | ICD-10-CM | POA: Diagnosis not present

## 2023-08-11 ENCOUNTER — Ambulatory Visit (INDEPENDENT_AMBULATORY_CARE_PROVIDER_SITE_OTHER): Payer: Medicare HMO

## 2023-08-11 VITALS — Ht 67.5 in | Wt 177.0 lb

## 2023-08-11 DIAGNOSIS — Z0000 Encounter for general adult medical examination without abnormal findings: Secondary | ICD-10-CM | POA: Diagnosis not present

## 2023-08-11 NOTE — Progress Notes (Signed)
 Subjective:   [name] is a 58 y.o. who presents for a Medicare Wellness preventive visit.  As a reminder, Annual Wellness Visits don't include a physical exam, and some assessments may be limited, especially if this visit is performed virtually. We may recommend an in-person follow-up visit with your provider if needed.  Visit Complete: Virtual I connected with  [name] on 08/11/23 by a audio enabled telemedicine application and verified that I am speaking with the correct person using two identifiers.  Patient Location: Home  Provider Location: Office/Clinic  I discussed the limitations of evaluation and management by telemedicine. The patient expressed understanding and agreed to proceed.  Vital Signs: Because this visit was a virtual/telehealth visit, some criteria may be missing or patient reported. Any vitals not documented were not able to be obtained and vitals that have been documented are patient reported.  VideoDeclined- This patient declined Librarian, academic. Therefore the visit was completed with audio only.  Persons Participating in Visit: Patient.  AWV Questionnaire: No: Patient Medicare AWV questionnaire was not completed prior to this visit.  Cardiac Risk Factors include: advanced age (>96men, >18 women);diabetes mellitus;dyslipidemia;sedentary lifestyle;hypertension     Objective:    Today's Vitals   08/11/23 1506  Weight: 177 lb (80.3 kg)  Height: 5' 7.5 (1.715 m)   Body mass index is 27.31 kg/m.     08/11/2023    3:15 PM 03/23/2023   12:02 PM 03/23/2023    9:18 AM 08/06/2022    1:35 PM 06/26/2022    3:03 PM 07/10/2021    6:20 AM 07/05/2021    1:04 PM  Advanced Directives  Does Patient Have a Medical Advance Directive? No No No No No No No  Would patient like information on creating a medical advance directive?  No - Patient declined Yes (ED - Information included in AVS) No - Patient declined  No - Patient  declined No - Patient declined    Current Medications (verified) Outpatient Encounter Medications as of 08/11/2023  Medication Sig   albuterol  (PROVENTIL ) (2.5 MG/3ML) 0.083% nebulizer solution Take 3 mLs (2.5 mg total) by nebulization every 4 (four) hours as needed for wheezing or shortness of breath.   albuterol  (VENTOLIN  HFA) 108 (90 Base) MCG/ACT inhaler INHALE 2 PUFFS BY MOUTH EVERY 6 HOURS AS NEEDED FOR WHEEZING OR SHORTNESS OF BREATH   amphetamine -dextroamphetamine  (ADDERALL) 20 MG tablet Take 1 tablet (20 mg total) by mouth 2 (two) times daily.   budesonide -formoterol  (SYMBICORT ) 160-4.5 MCG/ACT inhaler Inhale 2 puffs into the lungs 2 (two) times daily as needed.   buPROPion  (WELLBUTRIN  XL) 300 MG 24 hr tablet Take 1 tablet by mouth once daily   CALCIUM -VITAMIN D  PO Take 1 capsule by mouth in the morning and at bedtime.   cetirizine (ZYRTEC) 10 MG tablet Take 10 mg by mouth daily as needed for allergies or rhinitis.   chlorthalidone  (HYGROTON ) 25 MG tablet Take 1 tablet by mouth once daily   dabigatran  (PRADAXA ) 150 MG CAPS capsule Take 1 capsule (150 mg total) by mouth 2 (two) times daily.   folic acid  (FOLVITE ) 1 MG tablet Take 3 mg by mouth See admin instructions. Take 3 mg by mouth once a day on Wed/Thurs/Fri/Sat   HUMIRA PEN 40 MG/0.8ML PNKT Inject 40 mg into the skin every Monday.   ketorolac  (ACULAR ) 0.5 % ophthalmic solution Place 1 drop into the left eye 2 (two) times daily.   leucovorin (WELLCOVORIN) 5 MG tablet Take 5  mg by mouth See admin instructions. Take 5 mg by mouth once a day on only Sundays and Tuesdays   MAGNESIUM  GLYCINATE PO Take 1 capsule by mouth at bedtime.   metFORMIN  (GLUCOPHAGE ) 1000 MG tablet TAKE 1 TABLET BY MOUTH TWICE DAILY WITH A MEAL   methocarbamol  (ROBAXIN ) 500 MG tablet Take 1 tablet (500 mg total) by mouth every 8 (eight) hours as needed for muscle spasms (back pain). Caution of sedation   Methotrexate 25 MG/ML SOSY Inject 17.5 mg into the skin every  Monday.   metoprolol  tartrate (LOPRESSOR ) 100 MG tablet Take 1 tablet (100 mg total) by mouth 2 (two) times daily.   olmesartan  (BENICAR ) 20 MG tablet Take 1 tablet (20 mg total) by mouth daily.   pantoprazole  (PROTONIX ) 40 MG tablet Take 1 tablet (40 mg total) by mouth daily.   prednisoLONE  acetate (PRED FORTE ) 1 % ophthalmic suspension Place 4 drops into the left eye 2 (two) times daily.   rosuvastatin  (CRESTOR ) 5 MG tablet Take 1 tablet by mouth once daily   Semaglutide , 1 MG/DOSE, 4 MG/3ML SOPN Inject 1 mg as directed once a week.   traMADol  (ULTRAM ) 50 MG tablet Take 1 tablet (50 mg total) by mouth every 6 (six) hours as needed for severe pain (pain score 7-10) (for fibromyalgia flares and back pain).   benzonatate  (TESSALON ) 200 MG capsule Take 1 capsule (200 mg total) by mouth 3 (three) times daily as needed for cough. Swallow whole (Patient not taking: Reported on 08/11/2023)   No facility-administered encounter medications on file as of 08/11/2023.    Allergies (verified) Amlodipine , Cetirizine hcl, Erythromycin, Escitalopram oxalate, Lisinopril, Loratadine, Nifedipine, Sertraline hcl, and Vyvanse  [lisdexamfetamine dimesylate ]   History: Past Medical History:  Diagnosis Date   ADHD (attention deficit hyperactivity disorder)    ADD - no meds   Allergy     allergic rhinitis   Anxiety    Asthma    Cataract 2015?   Already had them removed   Chronic headaches    Degenerative disc disease    Depression    Diabetes mellitus without complication (HCC)    type 2   Eczema    no current problems as of 07/09/21   Fatty liver    Fibromyalgia    Fx of fibula 2009   GERD (gastroesophageal reflux disease)    Hemorrhoids    Hypertension    Hyperthyroidism    Hypothyroidism    IBS (irritable bowel syndrome)    Iritis, chronic    Macular hole of left eye 06/2017   PONV (postoperative nausea and vomiting)    Right leg DVT (HCC)    Sleep apnea    uses CPAP nightly   Vitamin D   insufficiency 06/16/2019   Past Surgical History:  Procedure Laterality Date   ABDOMINAL HYSTERECTOMY     ANTERIOR CRUCIATE LIGAMENT REPAIR Left 1996   small tear of ACL   BREAST EXCISIONAL BIOPSY Left 1995   BREAST SURGERY     left nipple removed - benign tumor   BUNIONECTOMY Bilateral    COLONOSCOPY     EYE SURGERY     cataracts removed, macular hole repaired left eye- both surgeries done at [facility]   FRACTURE SURGERY  July 2023   [place]ia plateau   LAPAROSCOPY  08/2004   endometriosis - several    ORIF TIBIA FRACTURE Right 07/10/2021   Procedure: OPEN REDUCTION INTERNAL FIXATION (ORIF) TIBIAL PLATEAU FRACTURE;  Surgeon: Celena Sharper, MD;  Location: [facility] OR;  Service: Orthopedics;  Laterality: Right;   WISDOM TOOTH EXTRACTION     Family History  Problem Relation Age of Onset   Hypertension Mother    Thyroid  disease Mother    Fibromyalgia Mother    Colon polyps Mother    Arthritis Mother    Diabetes Mother    Diabetes Father    Stroke Father    Kidney cancer Maternal Grandmother    Colon cancer Neg Hx    Esophageal cancer Neg Hx    Stomach cancer Neg Hx    Rectal cancer Neg Hx    Allergic rhinitis Neg Hx    Angioedema Neg Hx    Asthma Neg Hx    Atopy Neg Hx    Eczema Neg Hx    Immunodeficiency Neg Hx    Urticaria Neg Hx    Breast cancer Neg Hx    Social History   Socioeconomic History   Marital status: Divorced    Spouse name: Not on file   Number of children: 1   Years of education: Not on file   Highest education level: Associate degree: occupational, Scientist, product/process development, or vocational program  Occupational History   Occupation: Disabled due to fibromyalgia    Employer: UNEMPLOYED  Tobacco Use   Smoking status: Never   Smokeless tobacco: Never  Vaping Use   Vaping status: Never Used  Substance and Sexual Activity   Alcohol use: Never   Drug use: Never   Sexual activity: Not Currently    Birth control/protection: None  Other Topics Concern   Not on file  Social  History Narrative   1 caffeine drink daily    Social Drivers of Health   Financial Resource Strain: Low Risk  (08/11/2023)   Overall Financial Resource Strain (CARDIA)    Difficulty of Paying Living Expenses: Not hard at all  Food Insecurity: No Food Insecurity (08/11/2023)   Hunger Vital Sign    Worried About Running Out of Food in the Last Year: Never true    Ran Out of Food in the Last Year: Never true  Transportation Needs: No Transportation Needs (08/11/2023)   PRAPARE - Administrator, Civil Service (Medical): No    Lack of Transportation (Non-Medical): No  Physical Activity: Inactive (08/11/2023)   Exercise Vital Sign    Days of Exercise per Week: 0 days    Minutes of Exercise per Session: 0 min  Stress: No Stress Concern Present (08/11/2023)   [employer] of Occupational Health - Occupational Stress Questionnaire    Feeling of Stress: Not at all  Social Connections: Moderately Integrated (08/11/2023)   Social Connection and Isolation Panel    Frequency of Communication with Friends and Family: More than three times a week    Frequency of Social Gatherings with Friends and Family: Once a week    Attends Religious Services: More than 4 times per year    Active Member of [employer] or Organizations: Yes    Attends Engineer, structural: More than 4 times per year    Marital Status: Divorced    Tobacco Counseling Counseling given: Not Answered    Clinical Intake:  Pre-visit preparation completed: Yes  Pain : No/denies pain     BMI - recorded: 27.31 Nutritional Status: BMI 25 -29 Overweight Nutritional Risks: None Diabetes: Yes CBG done?: No Did pt. bring in CBG monitor from home?: No  Lab Results  Component Value Date   HGBA1C 7.0 (H) 06/29/2023   HGBA1C 7.0 (H) 03/23/2023   HGBA1C 6.4 (  A) 11/30/2022     How often do you need to have someone help you when you read instructions, pamphlets, or other written materials from your doctor or pharmacy?:  1 - Never  Interpreter Needed?: No  Comments: lives with daughter and parents Information entered by :: B.Paxon Propes,LPN   Activities of Daily Living     08/11/2023    3:15 PM 03/23/2023   12:02 PM  In your present state of health, do you have any difficulty performing the following activities:  Hearing? 0 0  Vision? 0 0  Difficulty concentrating or making decisions? 0 0  Walking or climbing stairs? 0   Dressing or bathing? 0   Doing errands, shopping? 0 0  Preparing Food and eating ? N   Using the Toilet? N   In the past six months, have you accidently leaked urine? N   Do you have problems with loss of bowel control? N   Managing your Medications? N   Managing your Finances? N   Housekeeping or managing your Housekeeping? N     Patient Care Team: Tower, Laine LABOR, MD as PCP - General (Family Medicine) Okey Vina GAILS, MD as PCP - Cardiology (Cardiology) Neysa Reggy BIRCH, MD as Consulting Physician (Pulmonary Disease) Skeet Aran, MD as Referring Physician (Ophthalmology) Leanor Charleston, MD as Referring Physician (Internal Medicine) Robinson Idol, MD as Consulting Physician (Ophthalmology) Iva Marty Saltness, MD as Consulting Physician (Allergy  and Immunology)  I have updated your Care Teams any recent Medical Services you may have received from other providers in the past year.     Assessment:   This is a routine wellness examination for [name].  Hearing/Vision screen Hearing Screening - Comments:: Pt says her hearing is goodwith hearing aids Vision Screening - Comments:: Pt says her vision is good;readers only Dr KANDICE Skeet   Goals Addressed             This Visit's Progress    COMPLETED: DIET - EAT MORE FRUITS AND VEGETABLES       COMPLETED: Increase water intake       Starting 04/10/2016, I will continue to drink at least 64 oz of water daily.      Patient Stated       08/11/23-still working on water intake/Drink more water.     COMPLETED: Reduce sugar intake  to X grams per day       Starting 03/20/2015, I will continue to decrease intake of sugar to less than 6 tsps per day.        Depression Screen     08/11/2023    3:12 PM 11/30/2022    8:33 AM 08/06/2022    1:35 PM 03/25/2022    8:09 AM 02/13/2022    9:01 AM 05/14/2021    1:36 PM 10/07/2020    2:33 PM  PHQ 2/9 Scores  PHQ - 2 Score 0 0 0 1 0 0 0  PHQ- 9 Score  4  1 4  4     Fall Risk     08/11/2023    3:10 PM 06/07/2023    8:00 AM 11/30/2022    8:33 AM 08/06/2022    1:37 PM 08/05/2022   10:32 PM  Fall Risk   Falls in the past year? 0 0 0 0 0  Number falls in past yr: 0 0 0 0   Injury with Fall? 0 0 0 0   Risk for fall due to : No Fall Risks No Fall Risks No Fall  Risks No Fall Risks   Follow up Education provided;Falls prevention discussed Falls evaluation completed Falls evaluation completed;Education provided Falls prevention discussed;Falls evaluation completed     MEDICARE RISK AT HOME:  Medicare Risk at Home Any stairs in or around the home?: Yes If so, are there any without handrails?: Yes Home free of loose throw rugs in walkways, pet beds, electrical cords, etc?: Yes Adequate lighting in your home to reduce risk of falls?: Yes Life alert?: No Use of a cane, walker or w/c?: No Grab bars in the bathroom?: No Shower chair or bench in shower?: No Elevated toilet seat or a handicapped toilet?: Yes  TIMED UP AND GO:  Was the test performed?  No  Cognitive Function: 6CIT completed    04/10/2016   10:32 AM 03/20/2015   11:15 AM  MMSE - Mini Mental State Exam  Orientation to time 5  5   Orientation to Place 5  5   Registration 3  3   Attention/ Calculation 0  5   Recall 3  3   Language- name 2 objects 0  0   Language- repeat 1 1  Language- follow 3 step command 3  3   Language- read & follow direction 0  1   Write a sentence 0  0   Copy design 0  0   Total score 20  26      Data saved with a previous flowsheet row definition        08/11/2023    3:33 PM 08/06/2022     1:38 PM 05/14/2021    1:41 PM  6CIT Screen  What Year? 0 points 0 points 0 points  What month? 0 points 0 points 0 points  What time? 0 points 0 points 0 points  Count back from 20 0 points 0 points 0 points  Months in reverse 0 points 0 points 0 points  Repeat phrase 0 points 2 points 0 points  Total Score 0 points 2 points 0 points    Immunizations Immunization History  Administered Date(s) Administered   Influenza Split 10/29/2010, 10/02/2011   Influenza Whole 10/13/2006, 10/03/2008, 10/09/2009   Influenza, Seasonal, Injecte, Preservative Fre 09/28/2022   Influenza,inj,Quad PF,6+ Mos 09/14/2012, 10/11/2013, 09/12/2015, 11/02/2016, 09/17/2017, 09/14/2018, 10/09/2019, 09/24/2021   Influenza-Unspecified 09/18/2014   PFIZER(Purple Top)SARS-COV-2 Vaccination 09/08/2019, 09/29/2019, 03/20/2020   PNEUMOCOCCAL CONJUGATE-20 09/28/2022   PPD Test 03/24/2012   Pneumococcal Polysaccharide-23 11/02/2012   Td 04/14/2023   Tdap 07/20/2011    Screening Tests Health Maintenance  Topic Date Due   Diabetic kidney evaluation - Urine ACR  Never done   Hepatitis C Screening  Never done   Hepatitis B Vaccines (1 of 3 - 19+ 3-dose series) Never done   Zoster Vaccines- Shingrix (1 of 2) Never done   FOOT EXAM  09/25/2022   INFLUENZA VACCINE  08/06/2023   Cervical Cancer Screening (HPV/Pap Cotest)  09/28/2023 (Originally 05/05/2009)   COVID-19 Vaccine (4 - 2024-25 season) 04/15/2024 (Originally 09/06/2022)   OPHTHALMOLOGY EXAM  08/21/2023   MAMMOGRAM  11/23/2023   HEMOGLOBIN A1C  12/29/2023   Diabetic kidney evaluation - eGFR measurement  06/28/2024   Medicare Annual Wellness (AWV)  08/10/2024   Colonoscopy  09/08/2028   DTaP/Tdap/Td (3 - Td or Tdap) 04/13/2033   Pneumococcal Vaccine: 19-49 Years  Completed   Pneumococcal Vaccine: 50+ Years  Completed   HIV Screening  Completed   HPV VACCINES  Aged Out   Meningococcal B Vaccine  Aged Out  Health Maintenance  Health Maintenance Due   Topic Date Due   Diabetic kidney evaluation - Urine ACR  Never done   Hepatitis C Screening  Never done   Hepatitis B Vaccines (1 of 3 - 19+ 3-dose series) Never done   Zoster Vaccines- Shingrix (1 of 2) Never done   FOOT EXAM  09/25/2022   INFLUENZA VACCINE  08/06/2023   Health Maintenance Items Addressed: None at this time:pt will get Shingles vaccine at her pharmacy  Additional Screening:  Vision Screening: Recommended annual ophthalmology exams for early detection of glaucoma and other disorders of the eye. Would you like a referral to an eye doctor? No    Dental Screening: Recommended annual dental exams for proper oral hygiene  Community Resource Referral / Chronic Care Management: CRR required this visit?  No   CCM required this visit?  Appt scheduled with PCPpt had to r/s   Plan:    I have personally reviewed and noted the following in the patient's chart:   Medical and social history Use of alcohol, tobacco or illicit drugs  Current medications and supplements including opioid prescriptions. Patient is currently taking opioid prescriptions. Information provided to patient regarding non-opioid alternatives. Patient advised to discuss non-opioid treatment plan with their provider. Functional ability and status Nutritional status Physical activity Advanced directives List of other physicians Hospitalizations, surgeries, and ER visits in previous 12 months Vitals Screenings to include cognitive, depression, and falls Referrals and appointments  In addition, I have reviewed and discussed with patient certain preventive protocols, quality metrics, and best practice recommendations. A written personalized care plan for preventive services as well as general preventive health recommendations were provided to patient.   Erminio LITTIE Saris, LPN   01/08/7972   After Visit Summary: (MyChart) Due to this being a telephonic visit, the after visit summary with patients  personalized plan was offered to patient via MyChart   Notes: Nothing significant to report at this time.

## 2023-08-11 NOTE — Patient Instructions (Signed)
 Ms. Pinela , Thank you for taking time out of your busy schedule to complete your Annual Wellness Visit with me. I enjoyed our conversation and look forward to speaking with you again next year. I, as well as your care team,  appreciate your ongoing commitment to your health goals. Please review the following plan we discussed and let me know if I can assist you in the future. Your Game plan/ To Do List    Referrals: If you haven't heard from the office you've been referred to, please reach out to them at the phone provided.   Follow up Visits: We will see or speak with you next year for your Next Medicare AWV with our clinical staff Have you seen your provider in the last 6 months (3 months if uncontrolled diabetes)?   Clinician Recommendations:  Aim for 30 minutes of exercise or brisk walking, 6-8 glasses of water, and 5 servings of fruits and vegetables each day.       This is a list of the screenings recommended for you:  Health Maintenance  Topic Date Due   Yearly kidney health urinalysis for diabetes  Never done   Hepatitis C Screening  Never done   Hepatitis B Vaccine (1 of 3 - 19+ 3-dose series) Never done   Zoster (Shingles) Vaccine (1 of 2) Never done   Complete foot exam   09/25/2022   Flu Shot  08/06/2023   Pap with HPV screening  09/28/2023*   COVID-19 Vaccine (4 - 2024-25 season) 04/15/2024*   Eye exam for diabetics  08/21/2023   Mammogram  11/23/2023   Hemoglobin A1C  12/29/2023   Yearly kidney function blood test for diabetes  06/28/2024   Medicare Annual Wellness Visit  08/10/2024   Colon Cancer Screening  09/08/2028   DTaP/Tdap/Td vaccine (3 - Td or Tdap) 04/13/2033   Pneumococcal Vaccine for high risk medical condition  Completed   Pneumococcal Vaccine for age over 11  Completed   HIV Screening  Completed   HPV Vaccine  Aged Out   Meningitis B Vaccine  Aged Out  *Topic was postponed. The date shown is not the original due date.    Advanced directives:  (Declined) Advance directive discussed with you today. Even though you declined this today, please call our office should you change your mind, and we can give you the proper paperwork for you to fill out. Advance Care Planning is important because it:  [x]  Makes sure you receive the medical care that is consistent with your values, goals, and preferences  [x]  It provides guidance to your family and loved ones and reduces their decisional burden about whether or not they are making the right decisions based on your wishes.  Follow the link provided in your after visit summary or read over the paperwork we have mailed to you to help you started getting your Advance Directives in place. If you need assistance in completing these, please reach out to us  so that we can help you!

## 2023-09-07 DIAGNOSIS — G4733 Obstructive sleep apnea (adult) (pediatric): Secondary | ICD-10-CM | POA: Diagnosis not present

## 2023-09-14 ENCOUNTER — Encounter (INDEPENDENT_AMBULATORY_CARE_PROVIDER_SITE_OTHER): Payer: Self-pay | Admitting: Otolaryngology

## 2023-09-14 ENCOUNTER — Ambulatory Visit (INDEPENDENT_AMBULATORY_CARE_PROVIDER_SITE_OTHER): Admitting: Otolaryngology

## 2023-09-14 VITALS — BP 128/85 | HR 82

## 2023-09-14 DIAGNOSIS — H6982 Other specified disorders of Eustachian tube, left ear: Secondary | ICD-10-CM | POA: Diagnosis not present

## 2023-09-14 DIAGNOSIS — H9012 Conductive hearing loss, unilateral, left ear, with unrestricted hearing on the contralateral side: Secondary | ICD-10-CM

## 2023-09-14 DIAGNOSIS — J310 Chronic rhinitis: Secondary | ICD-10-CM

## 2023-09-14 DIAGNOSIS — R0981 Nasal congestion: Secondary | ICD-10-CM

## 2023-09-14 DIAGNOSIS — J343 Hypertrophy of nasal turbinates: Secondary | ICD-10-CM

## 2023-09-14 DIAGNOSIS — J342 Deviated nasal septum: Secondary | ICD-10-CM | POA: Diagnosis not present

## 2023-09-14 NOTE — Progress Notes (Signed)
 Patient ID: [name], female   DOB: 09-Apr-1965, 58 y.o.   MRN: [id number]  Follow-up: Left eustachian tube dysfunction, left middle ear effusion   HPI: The patient is a 58 year old female who returns today for follow-up evaluation.  The patient has a history of left ear eustachian tube dysfunction, left middle ear effusion, and left ear conductive hearing loss.  She was also noted to have nasal mucosal congestion, nasal septal deviation, and bilateral inferior turbinate hypertrophy.  She was treated with Flonase nasal spray and Valsalva exercise.  At her last visit in April 2025, the middle ear effusion had resolved.  However, she continued to have symptom of left eustachian tube dysfunction.  She was continued on her Flonase and Valsalva exercise.  The patient returns today complaining of intermittent left ear pain.  She continues to have clogging sensation in her left ear.  She has difficulty performing the Valsalva exercise to auto insufflate her left middle ear space.  Exam: General: Communicates without difficulty, well nourished, no acute distress. Head: Normocephalic, no evidence injury, no tenderness, facial buttresses intact without stepoff. Face/sinus: No tenderness to palpation and percussion. Facial movement is normal and symmetric. Eyes: PERRL, EOMI. No scleral icterus, conjunctivae clear. Neuro: CN II exam reveals vision grossly intact.  No nystagmus at any point of gaze. Ears: Auricles well formed without lesions.  Ear canals are intact without mass or lesion.  No erythema or edema is appreciated.  The TMs are intact without fluid. Nose: External evaluation reveals normal support and skin without lesions.  Dorsum is intact.  Anterior rhinoscopy reveals congested mucosa over anterior aspect of inferior turbinates and deviated septum.  No purulence noted. Oral:  Oral cavity and oropharynx are intact, symmetric, without erythema or edema.  Mucosa is moist without lesions. Neck: Full range of  motion without pain.  There is no significant lymphadenopathy.  No masses palpable.  Thyroid  bed within normal limits to palpation.  Parotid glands and submandibular glands equal bilaterally without mass.  Trachea is midline. Neuro:  CN 2-12 grossly intact.   Assessment: 1.  Persistent left ear eustachian tube dysfunction. 2.  Chronic rhinitis with nasal mucosal congestion, nasal septal deviation, and bilateral inferior turbinate hypertrophy. 3.  No middle ear effusion is noted.  Plan: 1.  The physical exam findings are reviewed with the patient. 2.  Continue with Flonase and Valsalva exercise daily. 3.  The option of myringotomy and tube placement to treat her persistent left eustachian tube dysfunction is discussed.  Questions are invited and answered. 4.  The patient would like to proceed with conservative observation for now.  She will return for reevaluation in 6 months.

## 2023-09-22 ENCOUNTER — Telehealth: Payer: Self-pay

## 2023-09-22 ENCOUNTER — Encounter: Payer: Self-pay | Admitting: Student

## 2023-09-22 ENCOUNTER — Ambulatory Visit: Attending: Student | Admitting: Student

## 2023-09-22 VITALS — BP 118/70 | HR 88 | Ht 67.5 in | Wt 181.0 lb

## 2023-09-22 DIAGNOSIS — I10 Essential (primary) hypertension: Secondary | ICD-10-CM

## 2023-09-22 DIAGNOSIS — E785 Hyperlipidemia, unspecified: Secondary | ICD-10-CM

## 2023-09-22 DIAGNOSIS — I480 Paroxysmal atrial fibrillation: Secondary | ICD-10-CM | POA: Diagnosis not present

## 2023-09-22 NOTE — Patient Instructions (Signed)
 Medication Instructions:  Your physician recommends that you continue on your current medications as directed. Please refer to the Current Medication list given to you today.  *If you need a refill on your cardiac medications before your next appointment, please call your pharmacy*  Lab Work: NONE   If you have labs (blood work) drawn today and your tests are completely normal, you will receive your results only by: MyChart Message (if you have MyChart) OR A paper copy in the mail If you have any lab test that is abnormal or we need to change your treatment, we will call you to review the results.  Testing/Procedures: NONE   Follow-Up: At [facility], you and your health needs are our priority.  As part of our continuing mission to provide you with exceptional heart care, our providers are all part of one team.  This team includes your primary Cardiologist (physician) and Advanced Practice Providers or APPs (Physician Assistants and Nurse Practitioners) who all work together to provide you with the care you need, when you need it.  Your next appointment:   6 month(s)  Provider:   Vina Gull, MD or Laymon Qua, PA-C    We recommend signing up for the patient portal called MyChart.  Sign up information is provided on this After Visit Summary.  MyChart is used to connect with patients for Virtual Visits (Telemedicine).  Patients are able to view lab/test results, encounter notes, upcoming appointments, etc.  Non-urgent messages can be sent to your provider as well.   To learn more about what you can do with MyChart, go to [email or web address].   Other Instructions Thank you for choosing [facility]!

## 2023-09-22 NOTE — Progress Notes (Signed)
 Cardiology Office Note    Date:  09/22/2023  ID:  [name], DOB 1965/12/20, MRN [id number] Cardiologist: Vina Gull, MD Cardiology APP:  Johnson Laymon HERO, PA-C { :  History of Present Illness:    [name] is a 58 y.o. female with past medical history of paroxysmal atrial fibrillation (diagnosed during admission in 03/2023), HTN, HLD, Type II DM, fibromyalgia, hypothyroidism and OSA who presents to the office today for 49-month follow-up.  She was last examined by myself in 04/2023 following a recent admission for nausea, vomiting and diarrhea as she was found to be in atrial fibrillation with RVR during admission. At the time of follow-up, she reported overall feeling well and she had purchased a smart watch which had shown she was in normal sinus rhythm. Confirmed by EKG at the time of her visit. She was continued on current medical therapy at that time with Lopressor  50 mg twice daily and Eliquis  5 mg twice daily for anticoagulation given her CHA2DS2-VASc score of 3.  In talking with the patient today, she reports overall feeling well since her last office visit. She denies any recent chest pain or palpitations. No recent dyspnea on exertion, orthopnea, PND or pitting edema. 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 Does consume fast food regularly and we reviewed the importance of limiting sodium intake. Lopressor  was titrated by her PCP since her last office visit due to elevated BP. She remains on Eliquis  with no reports of active bleeding.   Studies Reviewed:   EKG: EKG is not ordered today.   Echocardiogram: 03/2023 IMPRESSIONS     1. Left ventricular ejection fraction, by estimation, is 60 to 65%. The  left ventricle has normal function. The left ventricle has no regional  wall motion abnormalities. There is mild concentric left ventricular  hypertrophy. Left ventricular diastolic  parameters are  indeterminate.   2. Right ventricular systolic function is normal. The right ventricular  size is normal. There is normal pulmonary artery systolic pressure. The  estimated right ventricular systolic pressure is 17.6 mmHg.   3. The mitral valve is grossly normal. No evidence of mitral valve  regurgitation.   4. The aortic valve is tricuspid. Aortic valve regurgitation is not  visualized.   5. The inferior vena cava is normal in size with greater than 50%  respiratory variability, suggesting right atrial pressure of 3 mmHg.   Comparison(s): No prior Echocardiogram.    Risk Assessment/Calculations:    CHA2DS2-VASc Score = 3  This indicates a 3.2% annual risk of stroke. The patient's score is based upon: CHF History: 0 HTN History: 1 Diabetes History: 1 Stroke History: 0 Vascular Disease History: 0 Age Score: 0 Gender Score: 1    Physical Exam:   VS:  BP 118/70   Pulse 88   Ht 5' 7.5 (1.715 m)   Wt 181 lb (82.1 kg)   LMP 08/05/2006   SpO2 97%   BMI 27.93 kg/m    Wt Readings from Last 3 Encounters:  09/22/23 181 lb (82.1 kg)  08/11/23 177 lb (80.3 kg)  06/07/23 177 lb 6 oz (80.5 kg)     GEN: Well nourished, well developed female appearing in no acute distress NECK: No JVD; No carotid bruits CARDIAC: RRR, no murmurs, rubs, gallops RESPIRATORY:  Clear to auscultation without rales, wheezing or rhonchi  ABDOMEN: Appears non-distended. No obvious abdominal masses. EXTREMITIES: No clubbing or cyanosis. No  pitting edema.  Distal pedal pulses are 2+ bilaterally.   Assessment and Plan:   1. PAF (paroxysmal atrial fibrillation) (HCC) - She denies any recent palpitations and has been in normal sinus rhythm with well-controlled heart rate by review of her smart watch data. Continue Lopressor  100 mg twice daily for rate-control. - Reviewed indications for Eliquis  including risks and benefits of anticoagulation and will continue Eliquis  5 mg twice daily at this time which is  the appropriate dose given her current age, weight and renal function. CBC in 06/2023 showed hemoglobin was stable at 13.8 and platelets at 297 K.  2. Essential hypertension - BP is well-controlled at 118/70 during today's visit. Continue Chlorthalidone  25 mg daily, Lopressor  100 mg twice daily and Benicar  20 mg daily.  3. HLD - Followed by PCP. FLP in 06/2023 showed total cholesterol 143, triglycerides 170, HDL 52 and LDL 56.  Continue current medical therapy with Crestor  5 mg daily.  Signed, Laymon CHRISTELLA Qua, PA-C

## 2023-09-22 NOTE — Telephone Encounter (Signed)
 Received refill reorder form from [facility] requesting refill on Ozempic ,fill and faxed provider portion to sign and date, can be fax to [employer] or fax back to [phone].

## 2023-09-23 ENCOUNTER — Other Ambulatory Visit

## 2023-09-24 DIAGNOSIS — H20023 Recurrent acute iridocyclitis, bilateral: Secondary | ICD-10-CM | POA: Diagnosis not present

## 2023-09-24 DIAGNOSIS — I480 Paroxysmal atrial fibrillation: Secondary | ICD-10-CM | POA: Diagnosis not present

## 2023-09-24 DIAGNOSIS — R682 Dry mouth, unspecified: Secondary | ICD-10-CM | POA: Diagnosis not present

## 2023-09-24 DIAGNOSIS — H35353 Cystoid macular degeneration, bilateral: Secondary | ICD-10-CM | POA: Diagnosis not present

## 2023-09-24 DIAGNOSIS — H3023 Posterior cyclitis, bilateral: Secondary | ICD-10-CM | POA: Diagnosis not present

## 2023-09-24 DIAGNOSIS — Z79899 Other long term (current) drug therapy: Secondary | ICD-10-CM | POA: Diagnosis not present

## 2023-09-25 ENCOUNTER — Other Ambulatory Visit: Payer: Self-pay | Admitting: Family Medicine

## 2023-09-25 DIAGNOSIS — I10 Essential (primary) hypertension: Secondary | ICD-10-CM

## 2023-09-28 ENCOUNTER — Encounter: Payer: Self-pay | Admitting: Family Medicine

## 2023-09-28 NOTE — Telephone Encounter (Signed)
 Received a call from pt saying she wants to increase on Ozempic  to 2 mg,pt explain she had already spoke with provider on her last appt , will resubmitt provider portion with new medication for provider to sign and date and will call [facility] to cancel Ozempic  1 mg.reorder form can be fax to [employer] of fax back to [phone].

## 2023-09-28 NOTE — Telephone Encounter (Signed)
 Received provider portion on [facility] Ozempic  ,faxed to [facility] today.

## 2023-09-29 MED ORDER — SEMAGLUTIDE (2 MG/DOSE) 8 MG/3ML ~~LOC~~ SOPN: 2.0000 mg | PEN_INJECTOR | SUBCUTANEOUS | 0 refills | Status: AC

## 2023-09-30 ENCOUNTER — Encounter: Admitting: Family Medicine

## 2023-10-01 ENCOUNTER — Telehealth: Payer: Self-pay

## 2023-10-01 NOTE — Telephone Encounter (Signed)
 Received provider portion of reorder form on increased to Ozempic  2 mg,faxed to [facility] today and will follow up in a few days.

## 2023-10-01 NOTE — Telephone Encounter (Signed)
 Form re-faxed

## 2023-10-01 NOTE — Telephone Encounter (Signed)
 Copied from CRM [id number]. Topic: Clinical - Prescription Issue >> Oct 01, 2023  8:28 AM Amy B wrote: Reason for CRM: [name] with pharmacy states they did not receive the entire fax and only received the first two pages regarding the patient's Ozempic  medication.  She asks that the request be refaxed to [phone].

## 2023-10-04 ENCOUNTER — Telehealth: Payer: Self-pay | Admitting: Student

## 2023-10-04 ENCOUNTER — Telehealth: Payer: Self-pay | Admitting: Family Medicine

## 2023-10-04 DIAGNOSIS — E039 Hypothyroidism, unspecified: Secondary | ICD-10-CM

## 2023-10-04 DIAGNOSIS — E559 Vitamin D deficiency, unspecified: Secondary | ICD-10-CM

## 2023-10-04 DIAGNOSIS — I10 Essential (primary) hypertension: Secondary | ICD-10-CM

## 2023-10-04 DIAGNOSIS — M8000XD Age-related osteoporosis with current pathological fracture, unspecified site, subsequent encounter for fracture with routine healing: Secondary | ICD-10-CM

## 2023-10-04 DIAGNOSIS — Z79899 Other long term (current) drug therapy: Secondary | ICD-10-CM

## 2023-10-04 DIAGNOSIS — E1169 Type 2 diabetes mellitus with other specified complication: Secondary | ICD-10-CM

## 2023-10-04 DIAGNOSIS — Z7985 Long-term (current) use of injectable non-insulin antidiabetic drugs: Secondary | ICD-10-CM

## 2023-10-04 NOTE — Telephone Encounter (Signed)
 Received provider portion and faxed to [facility] on Ozmepic 2 mg today.

## 2023-10-04 NOTE — Telephone Encounter (Signed)
-----   Message from Veva JINNY Ferrari sent at 09/20/2023  3:54 PM EDT ----- Regarding: Lab orders for Wed, 10.1.25 Patient is scheduled for CPX labs, please order future labs, Thanks , Veva

## 2023-10-04 NOTE — Telephone Encounter (Signed)
 Attempt to reach, no answer,voicemail full   I sent secure chat to C.Pavero,RPH,he does not see why she cannot have procedure done and there is no time to hold eliquis  anyway.

## 2023-10-04 NOTE — Telephone Encounter (Signed)
 Patient is having a crown place tomorrow, and is going to get her gum scraped. She wants to know if it's okay to have that done.

## 2023-10-05 NOTE — Telephone Encounter (Signed)
 I was able to reach patient this morning.She says the dentist told her she had excess gum tissue around the tooth getting the crown today

## 2023-10-05 NOTE — Telephone Encounter (Signed)
 Rwceived approval letter on Ozempic  2 mg approval letter was index thru 01/05/2024

## 2023-10-05 NOTE — Progress Notes (Signed)
 TANZA PELLOT                                          MRN: [id number]   10/05/2023   The [facility] Quality Team Specialist reviewed this patient medical record for the purposes of chart review for care gap closure. The following were reviewed: chart review for care gap closure-kidney health evaluation for diabetes:eGFR  and uACR.    [facility] Quality Team

## 2023-10-06 ENCOUNTER — Other Ambulatory Visit (INDEPENDENT_AMBULATORY_CARE_PROVIDER_SITE_OTHER)

## 2023-10-06 DIAGNOSIS — E785 Hyperlipidemia, unspecified: Secondary | ICD-10-CM | POA: Diagnosis not present

## 2023-10-06 DIAGNOSIS — E039 Hypothyroidism, unspecified: Secondary | ICD-10-CM

## 2023-10-06 DIAGNOSIS — E119 Type 2 diabetes mellitus without complications: Secondary | ICD-10-CM | POA: Diagnosis not present

## 2023-10-06 DIAGNOSIS — Z79899 Other long term (current) drug therapy: Secondary | ICD-10-CM | POA: Diagnosis not present

## 2023-10-06 DIAGNOSIS — Z7985 Long-term (current) use of injectable non-insulin antidiabetic drugs: Secondary | ICD-10-CM | POA: Diagnosis not present

## 2023-10-06 DIAGNOSIS — I10 Essential (primary) hypertension: Secondary | ICD-10-CM | POA: Diagnosis not present

## 2023-10-06 DIAGNOSIS — E559 Vitamin D deficiency, unspecified: Secondary | ICD-10-CM | POA: Diagnosis not present

## 2023-10-06 DIAGNOSIS — E1169 Type 2 diabetes mellitus with other specified complication: Secondary | ICD-10-CM

## 2023-10-06 LAB — COMPREHENSIVE METABOLIC PANEL WITH GFR
ALT: 7 U/L (ref 0–35)
AST: 16 U/L (ref 0–37)
Albumin: 3.9 g/dL (ref 3.5–5.2)
Alkaline Phosphatase: 66 U/L (ref 39–117)
BUN: 13 mg/dL (ref 6–23)
CO2: 30 meq/L (ref 19–32)
Calcium: 9.5 mg/dL (ref 8.4–10.5)
Chloride: 100 meq/L (ref 96–112)
Creatinine, Ser: 0.92 mg/dL (ref 0.40–1.20)
GFR: 69.71 mL/min (ref >60.00)
Glucose, Bld: 128 mg/dL — ABNORMAL HIGH (ref 70–99)
Potassium: 3.9 meq/L (ref 3.5–5.1)
Sodium: 139 meq/L (ref 135–145)
Total Bilirubin: 0.8 mg/dL (ref 0.2–1.2)
Total Protein: 6.7 g/dL (ref 6.0–8.3)

## 2023-10-06 LAB — CBC WITH DIFFERENTIAL/PLATELET
Basophils Absolute: 0.1 K/uL (ref 0.0–0.1)
Basophils Relative: 1.4 % (ref 0.0–3.0)
Eosinophils Absolute: 0.4 K/uL (ref 0.0–0.7)
Eosinophils Relative: 5.1 % — ABNORMAL HIGH (ref 0.0–5.0)
HCT: 39.9 % (ref 36.0–46.0)
Hemoglobin: 13.3 g/dL (ref 12.0–15.0)
Lymphocytes Relative: 50.7 % — ABNORMAL HIGH (ref 12.0–46.0)
Lymphs Abs: 4.5 K/uL — ABNORMAL HIGH (ref 0.7–4.0)
MCHC: 33.4 g/dL (ref 30.0–36.0)
MCV: 86.9 fl (ref 78.0–100.0)
Monocytes Absolute: 0.6 K/uL (ref 0.1–1.0)
Monocytes Relative: 6.5 % (ref 3.0–12.0)
Neutro Abs: 3.2 K/uL (ref 1.4–7.7)
Neutrophils Relative %: 36.3 % — ABNORMAL LOW (ref 43.0–77.0)
Platelets: 265 K/uL (ref 150.0–400.0)
RBC: 4.59 Mil/uL (ref 3.87–5.11)
RDW: 14.5 % (ref 11.5–15.5)
WBC: 8.8 K/uL (ref 4.0–10.5)

## 2023-10-06 LAB — VITAMIN D 25 HYDROXY (VIT D DEFICIENCY, FRACTURES): VITD: 31.47 ng/mL (ref 30.00–100.00)

## 2023-10-06 LAB — LIPID PANEL
Cholesterol: 132 mg/dL (ref 0–200)
HDL: 55.8 mg/dL (ref >39.00)
LDL Cholesterol: 50 mg/dL (ref 0–99)
NonHDL: 76.31
Total CHOL/HDL Ratio: 2
Triglycerides: 132 mg/dL (ref 0.0–149.0)
VLDL: 26.4 mg/dL (ref 0.0–40.0)

## 2023-10-06 LAB — HEMOGLOBIN A1C: Hgb A1c MFr Bld: 7.3 % — ABNORMAL HIGH (ref 4.6–6.5)

## 2023-10-06 LAB — MICROALBUMIN / CREATININE URINE RATIO
Creatinine,U: 75.1 mg/dL
Microalb Creat Ratio: UNDETERMINED mg/g (ref 0.0–30.0)
Microalb, Ur: <0.7 mg/dL

## 2023-10-06 LAB — TSH: TSH: 5.77 u[IU]/mL — ABNORMAL HIGH (ref 0.35–5.50)

## 2023-10-06 LAB — VITAMIN B12: Vitamin B-12: 181 pg/mL — ABNORMAL LOW (ref 211–911)

## 2023-10-07 ENCOUNTER — Ambulatory Visit: Payer: Self-pay | Admitting: Family Medicine

## 2023-10-11 ENCOUNTER — Encounter: Payer: Self-pay | Admitting: Pharmacist

## 2023-10-15 ENCOUNTER — Encounter: Payer: Self-pay | Admitting: Family Medicine

## 2023-10-15 ENCOUNTER — Ambulatory Visit (INDEPENDENT_AMBULATORY_CARE_PROVIDER_SITE_OTHER): Admitting: Family Medicine

## 2023-10-15 ENCOUNTER — Encounter: Payer: Self-pay | Admitting: Pharmacist

## 2023-10-15 VITALS — BP 134/78 | HR 79 | Temp 98.1°F | Ht 66.0 in | Wt 181.0 lb

## 2023-10-15 DIAGNOSIS — Z7985 Long-term (current) use of injectable non-insulin antidiabetic drugs: Secondary | ICD-10-CM | POA: Diagnosis not present

## 2023-10-15 DIAGNOSIS — E785 Hyperlipidemia, unspecified: Secondary | ICD-10-CM | POA: Diagnosis not present

## 2023-10-15 DIAGNOSIS — M8000XD Age-related osteoporosis with current pathological fracture, unspecified site, subsequent encounter for fracture with routine healing: Secondary | ICD-10-CM

## 2023-10-15 DIAGNOSIS — E538 Deficiency of other specified B group vitamins: Secondary | ICD-10-CM

## 2023-10-15 DIAGNOSIS — Z23 Encounter for immunization: Secondary | ICD-10-CM | POA: Diagnosis not present

## 2023-10-15 DIAGNOSIS — F418 Other specified anxiety disorders: Secondary | ICD-10-CM

## 2023-10-15 DIAGNOSIS — K219 Gastro-esophageal reflux disease without esophagitis: Secondary | ICD-10-CM

## 2023-10-15 DIAGNOSIS — E559 Vitamin D deficiency, unspecified: Secondary | ICD-10-CM | POA: Diagnosis not present

## 2023-10-15 DIAGNOSIS — E039 Hypothyroidism, unspecified: Secondary | ICD-10-CM

## 2023-10-15 DIAGNOSIS — I10 Essential (primary) hypertension: Secondary | ICD-10-CM | POA: Diagnosis not present

## 2023-10-15 DIAGNOSIS — E1169 Type 2 diabetes mellitus with other specified complication: Secondary | ICD-10-CM

## 2023-10-15 DIAGNOSIS — Z0000 Encounter for general adult medical examination without abnormal findings: Secondary | ICD-10-CM | POA: Diagnosis not present

## 2023-10-15 DIAGNOSIS — Z79899 Other long term (current) drug therapy: Secondary | ICD-10-CM

## 2023-10-15 MED ORDER — CYANOCOBALAMIN 1000 MCG/ML IJ SOLN
1000.0000 ug | Freq: Once | INTRAMUSCULAR | Status: AC
  Administered 2023-10-15: 1000 ug via INTRAMUSCULAR

## 2023-10-15 MED ORDER — ALENDRONATE SODIUM 70 MG PO TABS: 70.0000 mg | ORAL_TABLET | ORAL | 3 refills | Status: AC

## 2023-10-15 NOTE — Progress Notes (Signed)
 Chart Review Reason: Drug information Question - Patient assistance  Summary: Patient was expecting Ozempic  from [facility], though no record of shipment at this time.   Per Mia tab, Ozempic  2.0 approved through [employer] on 10/04/23. Order processing can take a couple of days, then shipment is expected within 10-14 business days.   Therefore, would expect shipment by ~10/26/23.   Order status can be tracked via calling [facility] and using the automated system.   [name], PharmD Clinical Pharmacist [facility] [phone]

## 2023-10-15 NOTE — Progress Notes (Signed)
 Subjective:    Patient ID: [name], female    DOB: [birth year], 58 y.o.   MRN: [id number]  HPI  Here for health maintenance exam and to review chronic medical problems   Wt Readings from Last 3 Encounters:  10/15/23 181 lb (82.1 kg)  09/22/23 181 lb (82.1 kg)  08/11/23 177 lb (80.3 kg)   29.21 kg/m  Vitals:   10/15/23 1400  BP: 134/78  Pulse: 79  Temp: 98.1 F (36.7 C)  SpO2: 97%    Immunization History  Administered Date(s) Administered   Influenza Split 10/29/2010, 10/02/2011   Influenza Whole 10/13/2006, 10/03/2008, 10/09/2009   Influenza, Seasonal, Injecte, Preservative Fre 09/28/2022, 10/15/2023   Influenza,inj,Quad PF,6+ Mos 09/14/2012, 10/11/2013, 09/12/2015, 11/02/2016, 09/17/2017, 09/14/2018, 10/09/2019, 09/24/2021   Influenza-Unspecified 09/18/2014   PFIZER(Purple Top)SARS-COV-2 Vaccination 09/08/2019, 09/29/2019, 03/20/2020   PNEUMOCOCCAL CONJUGATE-20 09/28/2022   PPD Test 03/24/2012   Pneumococcal Polysaccharide-23 11/02/2012   Td 04/14/2023   Tdap 07/20/2011    Health Maintenance Due  Topic Date Due   Hepatitis C Screening  Never done   Hepatitis B Vaccines 19-59 Average Risk (1 of 3 - 19+ 3-dose series) Never done   Zoster Vaccines- Shingrix (1 of 2) Never done   Cervical Cancer Screening (HPV/Pap Cotest)  05/05/2009   FOOT EXAM  09/25/2022   Mammogram  11/23/2023   Flu shot -today   Mammogram 11/2022 - has it scheduled for December  Self breast exam- no lumps   Gyn health Hysterectomy   Colon cancer screening  09/2018   Bone health  Dexa 09/2021 ([place])   Was previously on alendronat [place]-none  Fractures-none  Supplements -none  Last vitamin D  Lab Results  Component Value Date   VD25OH 31.47 10/06/2023    Exercise  Active job- walking at work    Mood    10/15/2023    2:04 PM 08/11/2023    3:12 PM 11/30/2022    8:33 AM 08/06/2022    1:35 PM 03/25/2022    8:09 AM  Depression screen PHQ 2/9  Decreased Interest 0 0  0 0 1  Down, Depressed, Hopeless 0 0 0 0 0  PHQ - 2 Score 0 0 0 0 1  Altered sleeping 2  2  0  Tired, decreased energy 1  1  0  Change in appetite 0  1  0  Feeling bad or failure about yourself  0  0  0  Trouble concentrating 1  0  0  Moving slowly or fidgety/restless 0  0  0  Suicidal thoughts 0  0  0  PHQ-9 Score 4  4  1   Difficult doing work/chores Not difficult at all  Not difficult at all  Not difficult at all   Wellbutrin  xr 300 mg daily  Doing fine with this    HTN  (with a fib)  bp is stable today  No cp or palpitations or headaches or edema  No side effects to medicines  BP Readings from Last 3 Encounters:  10/15/23 134/78  09/22/23 118/70  09/14/23 128/85    Chlorthalidone  25 mg daily  Olmesartan  20 mg daily  Metoprolol  100 mg bid   Pulse Readings from Last 3 Encounters:  10/15/23 79  09/22/23 88  09/14/23 82   On eliquis  for another 6 months from cardiology   Lab Results  Component Value Date   NA 139 10/06/2023   K 3.9 10/06/2023   CO2 30 10/06/2023   GLUCOSE 128 (H) 10/06/2023  BUN 13 10/06/2023   CREATININE 0.92 10/06/2023   CALCIUM  9.5 10/06/2023   GFR 69.71 10/06/2023   GFRNONAA >60 03/24/2023   Hypothyroidism  Pt has no clinical changes No change in energy level/ hair or skin/ edema and no tremor Lab Results  Component Value Date   TSH 5.77 (H) 10/06/2023     Off levothyroxine  Feels fine without it    GERD Protonix  40 mg daily  Lab Results  Component Value Date   VITAMINB12 181 (L) 10/06/2023  Some fatigue   DM2 Lab Results  Component Value Date   HGBA1C 7.3 (H) 10/06/2023   HGBA1C 7.0 (H) 06/29/2023   HGBA1C 7.0 (H) 03/23/2023   Metformin  1000 mg bid  Semaglutide  2 mg weekly-waiting for delivery for pt assist (she has very high copay)   Lab Results  Component Value Date   MICROALBUR <0.7 10/06/2023   Eating has not been good   Lab Results  Component Value Date   WBC 8.8 10/06/2023   HGB 13.3 10/06/2023   HCT  39.9 10/06/2023   MCV 86.9 10/06/2023   PLT 265.0 10/06/2023   Lab Results  Component Value Date   ALT 7 10/06/2023   AST 16 10/06/2023   ALKPHOS 66 10/06/2023   BILITOT 0.8 10/06/2023     Cholesterol Lab Results  Component Value Date   CHOL 132 10/06/2023   CHOL 143 06/29/2023   CHOL 118 09/21/2022   Lab Results  Component Value Date   HDL 55.80 10/06/2023   HDL 52.30 06/29/2023   HDL 52.70 09/21/2022   Lab Results  Component Value Date   LDLCALC 50 10/06/2023   LDLCALC 56 06/29/2023   LDLCALC 40 09/21/2022   Lab Results  Component Value Date   TRIG 132.0 10/06/2023   TRIG 170.0 (H) 06/29/2023   TRIG 126.0 09/21/2022   Lab Results  Component Value Date   CHOLHDL 2 10/06/2023   CHOLHDL 3 06/29/2023   CHOLHDL 2 09/21/2022   No results found for: LDLDIRECT Crestor  5 mg daily    Patient Active Problem List   Diagnosis Date Noted   Vitamin B12 deficiency 10/15/2023   Tongue papillae hypertrophy 05/19/2023   Dry mouth 04/26/2023   Atrial fibrillation with RVR (HCC) 03/23/2023   Conductive hearing loss in left ear 02/02/2023   Left chronic serous otitis media 02/02/2023   Hypertrophy of nasal turbinates 02/02/2023   Other specified disorders of eustachian tube, left ear 02/02/2023   Hearing loss 11/30/2022   Current use of proton pump inhibitor 09/20/2022   Piriformis muscle pain 07/29/2022   Aortic atherosclerosis 03/25/2022   Fractures, stress 12/26/2021   Immunosuppressed status 11/19/2021   Encounter for screening mammogram for breast cancer 09/24/2021   Osteoporosis 09/24/2021   History of DVT (deep vein thrombosis) 07/11/2021   Sleep apnea 05/14/2021   Hyperlipidemia associated with type 2 diabetes mellitus (HCC) 07/10/2019   Cystoid macular edema of both eyes 06/16/2019   Vitamin D  deficiency 06/16/2019   Diabetes mellitus treated with injections of non-insulin  medication (HCC) 06/06/2019   Peripheral edema 10/30/2017   Chronic rhinitis  08/10/2017   Welcome to Medicare preventive visit 04/20/2017   Chronic low back pain 12/09/2015   Degeneration of lumbar or lumbosacral intervertebral disc 12/09/2015   Hypothyroid 01/24/2014   Pseudophakia of both eyes 12/15/2013   Deviated nasal septum 01/03/2013   Hydradenitis 11/02/2012   Cystoid macular degeneration of retina 05/13/2012   Macular hole 03/23/2012   Screening-pulmonary TB 03/23/2012  Routine general medical examination at a health care facility 10/02/2011   Iritis, recurrent 10/02/2011   Tachycardia 10/02/2011   ADD (attention deficit disorder) 08/20/2010   DYSHIDROTIC ECZEMA, HANDS 11/12/2009   Obstructive sleep apnea 11/19/2006   Depression with anxiety 09/29/2006   Essential hypertension 09/29/2006   Seasonal and perennial allergic rhinitis 09/29/2006   Asthma, mild intermittent, well-controlled 09/29/2006   GERD 09/29/2006   IBS 09/29/2006   ENDOMETRIOSIS 09/29/2006   FEMALE INFERTILITY 09/29/2006   Fibromyalgia 09/29/2006   INSOMNIA 09/29/2006   Past Medical History:  Diagnosis Date   ADHD (attention deficit hyperactivity disorder)    ADD - no meds   Allergy     allergic rhinitis   Anxiety    Arthritis    Asthma    Cataract 2015?   Already had them removed   Chronic headaches    Degenerative disc disease    Depression    Diabetes mellitus without complication (HCC)    type 2   Eczema    no current problems as of 07/09/21   Fatty liver    Fibromyalgia    Fx of fibula 2009   GERD (gastroesophageal reflux disease)    Hemorrhoids    Hypertension    Hyperthyroidism    Hypothyroidism    IBS (irritable bowel syndrome)    Iritis, chronic    Macular hole of left eye 06/2017   PONV (postoperative nausea and vomiting)    Right leg DVT (HCC)    Sleep apnea    uses CPAP nightly   Vitamin D  insufficiency 06/16/2019   Past Surgical History:  Procedure Laterality Date   ABDOMINAL HYSTERECTOMY  2008   ANTERIOR CRUCIATE LIGAMENT REPAIR Left 1996    small tear of ACL   BREAST EXCISIONAL BIOPSY Left 1995   BREAST SURGERY     left nipple removed - benign tumor   BUNIONECTOMY Bilateral    COLONOSCOPY     EYE SURGERY     cataracts removed, macular hole repaired left eye- both surgeries done at [facility]   FRACTURE SURGERY  July 2023   [place] plateau   LAPAROSCOPY  08/2004   endometriosis - several    ORIF TIBIA FRACTURE Right 07/10/2021   Procedure: OPEN REDUCTION INTERNAL FIXATION (ORIF) TIBIAL PLATEAU FRACTURE;  Surgeon: Celena Sharper, MD;  Location: [facility] OR;  Service: Orthopedics;  Laterality: Right;   WISDOM TOOTH EXTRACTION     Social History   Tobacco Use   Smoking status: Never   Smokeless tobacco: Never  Vaping Use   Vaping status: Never Used  Substance Use Topics   Alcohol use: Never   Drug use: Never   Family History  Problem Relation Age of Onset   Hypertension Mother    Thyroid  disease Mother    Fibromyalgia Mother    Colon polyps Mother    Arthritis Mother    Diabetes Mother    Diabetes Father    Stroke Father    Kidney cancer Maternal Grandmother    Colon cancer Neg Hx    Esophageal cancer Neg Hx    Stomach cancer Neg Hx    Rectal cancer Neg Hx    Allergic rhinitis Neg Hx    Angioedema Neg Hx    Asthma Neg Hx    Atopy Neg Hx    Eczema Neg Hx    Immunodeficiency Neg Hx    Urticaria Neg Hx    Breast cancer Neg Hx    Allergies  Allergen Reactions   Amlodipine   Pedal edema    Cetirizine Hcl Other (See Comments)    Not effective   Erythromycin Nausea Only and Other (See Comments)    Caused a stomach ache   Escitalopram Oxalate Other (See Comments)    No improvement   Lisinopril Cough   Loratadine Other (See Comments)    Not effective   Nifedipine Other (See Comments)    Fatigue and tremors   Sertraline Hcl Other (See Comments)    Did not help   Vyvanse  [Lisdexamfetamine Dimesylate ]     Headache and excitablility   Current Outpatient Medications on File Prior to Visit  Medication  Sig Dispense Refill   albuterol  (PROVENTIL ) (2.5 MG/3ML) 0.083% nebulizer solution Take 3 mLs (2.5 mg total) by nebulization every 4 (four) hours as needed for wheezing or shortness of breath. 75 mL 1   albuterol  (VENTOLIN  HFA) 108 (90 Base) MCG/ACT inhaler INHALE 2 PUFFS BY MOUTH EVERY 6 HOURS AS NEEDED FOR WHEEZING OR SHORTNESS OF BREATH 18 g 0   amphetamine -dextroamphetamine  (ADDERALL) 20 MG tablet Take 1 tablet (20 mg total) by mouth 2 (two) times daily. 60 tablet 0   budesonide -formoterol  (SYMBICORT ) 160-4.5 MCG/ACT inhaler Inhale 2 puffs into the lungs 2 (two) times daily as needed. 1 each 5   buPROPion  (WELLBUTRIN  XL) 300 MG 24 hr tablet Take 1 tablet by mouth once daily 90 tablet 3   CALCIUM -VITAMIN D  PO Take 1 capsule by mouth in the morning and at bedtime.     cetirizine (ZYRTEC) 10 MG tablet Take 10 mg by mouth daily as needed for allergies or rhinitis.     chlorthalidone  (HYGROTON ) 25 MG tablet Take 1 tablet by mouth once daily 90 tablet 1   ELIQUIS  5 MG TABS tablet Take 5 mg by mouth 2 (two) times daily.     folic acid  (FOLVITE ) 1 MG tablet Take 3 mg by mouth See admin instructions. Take 3 mg by mouth once a day on Wed/Thurs/Fri/Sat     HUMIRA PEN 40 MG/0.8ML PNKT Inject 40 mg into the skin every Monday.     ketorolac  (ACULAR ) 0.5 % ophthalmic solution Place 1 drop into the left eye 2 (two) times daily.     leucovorin (WELLCOVORIN) 5 MG tablet Take 5 mg by mouth See admin instructions. Take 5 mg by mouth once a day on only Sundays and Tuesdays     MAGNESIUM  GLYCINATE PO Take 1 capsule by mouth at bedtime.     metFORMIN  (GLUCOPHAGE ) 1000 MG tablet TAKE 1 TABLET BY MOUTH TWICE DAILY WITH A MEAL 180 tablet 1   methocarbamol  (ROBAXIN ) 500 MG tablet Take 1 tablet (500 mg total) by mouth every 8 (eight) hours as needed for muscle spasms (back pain). Caution of sedation 30 tablet 3   Methotrexate 25 MG/ML SOSY Inject 17.5 mg into the skin every Monday.     metoprolol  tartrate (LOPRESSOR ) 100  MG tablet Take 1 tablet (100 mg total) by mouth 2 (two) times daily. 180 tablet 0   olmesartan  (BENICAR ) 20 MG tablet Take 1 tablet by mouth once daily 90 tablet 0   pantoprazole  (PROTONIX ) 40 MG tablet Take 1 tablet (40 mg total) by mouth daily. 90 tablet 2   prednisoLONE  acetate (PRED FORTE ) 1 % ophthalmic suspension Place 4 drops into the left eye 2 (two) times daily.     rosuvastatin  (CRESTOR ) 5 MG tablet Take 1 tablet by mouth once daily 90 tablet 1   Semaglutide , 2 MG/DOSE, 8 MG/3ML SOPN Inject 2 mg as  directed once a week. 3 mL 0   traMADol  (ULTRAM ) 50 MG tablet Take 1 tablet (50 mg total) by mouth every 6 (six) hours as needed for severe pain (pain score 7-10) (for fibromyalgia flares and back pain). 60 tablet 0   No current facility-administered medications on file prior to visit.    Review of Systems  Constitutional:  Positive for fatigue. Negative for activity change, appetite change, fever and unexpected weight change.  HENT:  Negative for congestion, ear pain, rhinorrhea, sinus pressure and sore throat.   Eyes:  Negative for pain, redness and visual disturbance.  Respiratory:  Negative for cough, shortness of breath and wheezing.   Cardiovascular:  Negative for chest pain and palpitations.  Gastrointestinal:  Negative for abdominal pain, blood in stool, constipation and diarrhea.  Endocrine: Negative for polydipsia and polyuria.  Genitourinary:  Negative for dysuria, frequency and urgency.  Musculoskeletal:  Positive for arthralgias, back pain and myalgias.  Skin:  Negative for pallor and rash.  Allergic/Immunologic: Negative for environmental allergies.  Neurological:  Negative for dizziness, syncope and headaches.  Hematological:  Negative for adenopathy. Does not bruise/bleed easily.  Psychiatric/Behavioral:  Negative for decreased concentration and dysphoric mood. The patient is not nervous/anxious.        Objective:   Physical Exam Constitutional:      General: She  is not in acute distress.    Appearance: Normal appearance. She is well-developed. She is not ill-appearing or diaphoretic.  HENT:     Head: Normocephalic and atraumatic.     Right Ear: Tympanic membrane, ear canal and external ear normal.     Left Ear: Tympanic membrane, ear canal and external ear normal.     Nose: Nose normal. No congestion.     Mouth/Throat:     Mouth: Mucous membranes are moist.     Pharynx: Oropharynx is clear. No posterior oropharyngeal erythema.  Eyes:     General: No scleral icterus.    Extraocular Movements: Extraocular movements intact.     Conjunctiva/sclera: Conjunctivae normal.     Pupils: Pupils are equal, round, and reactive to light.  Neck:     Thyroid : No thyromegaly.     Vascular: No carotid bruit or JVD.  Cardiovascular:     Rate and Rhythm: Normal rate and regular rhythm.     Pulses: Normal pulses.     Heart sounds: Normal heart sounds.     No gallop.  Pulmonary:     Effort: Pulmonary effort is normal. No respiratory distress.     Breath sounds: Normal breath sounds. No wheezing.     Comments: Good air exch Chest:     Chest wall: No tenderness.  Abdominal:     General: Bowel sounds are normal. There is no distension or abdominal bruit.     Palpations: Abdomen is soft. There is no mass.     Tenderness: There is no abdominal tenderness.     Hernia: No hernia is present.  Genitourinary:    Comments: Breast exam: No mass, nodules, thickening, tenderness, bulging, retraction, inflamation, nipple discharge or skin changes noted.  No axillary or clavicular LA.     Musculoskeletal:        General: No tenderness. Normal range of motion.     Cervical back: Normal range of motion and neck supple. No rigidity. No muscular tenderness.     Right lower leg: No edema.     Left lower leg: No edema.     Comments: No kyphosis  Lymphadenopathy:     Cervical: No cervical adenopathy.  Skin:    General: Skin is warm and dry.     Coloration: Skin is not  pale.     Findings: No erythema or rash.     Comments: Solar lentigines diffusely   Neurological:     Mental Status: She is alert. Mental status is at baseline.     Cranial Nerves: No cranial nerve deficit.     Motor: No abnormal muscle tone.     Coordination: Coordination normal.     Gait: Gait normal.     Deep Tendon Reflexes: Reflexes are normal and symmetric.  Psychiatric:        Mood and Affect: Mood normal.        Cognition and Memory: Cognition and memory normal.           Assessment & Plan:   Problem List Items Addressed This Visit       Cardiovascular and Mediastinum   Essential hypertension   bp in fair control at this time  BP Readings from Last 1 Encounters:  10/15/23 134/78   No changes needed Most recent labs reviewed  Disc lifstyle change with low sodium diet and exercise   Chlorthalidone  25 mg daily  Olmesartan  20 mg daily  Metoprolol  100 mg bid-tolerating well          Digestive   GERD   Protonix  40 mg daily  Well controlled         Endocrine   Hypothyroid   Hypothyroidism -subclinical  Pt has no clinical changes No change in energy level/ hair or skin/ edema and no tremor Lab Results  Component Value Date   TSH 5.77 (H) 10/06/2023    Off levothyroxine   Prefers to be off medication       Hyperlipidemia associated with type 2 diabetes mellitus (HCC)   Disc goals for lipids and reasons to control them Rev last labs with pt Rev low sat fat diet in detail Crestor  5 mg daily        Diabetes mellitus treated with injections of non-insulin  medication (HCC)   Lab Results  Component Value Date   HGBA1C 7.3 (H) 10/06/2023   HGBA1C 7.0 (H) 06/29/2023   HGBA1C 7.0 (H) 03/23/2023  Up  Metformin  1000 mg bid  Semaglutide  2 mg weekly (waiting on mail/pt assist)  This will unfortunately run out  Not compliant with low glycemic diet Discussed this Microalb utd  Discussed exercise options Follow up 3 mo         Musculoskeletal and  Integument   Osteoporosis   Will start back on alendronate  (hold for dental work on future) No falls or fractures  Dexa ordered Discussed fall prevention, supplements and exercise for bone density        Relevant Medications   alendronate  (FOSAMAX ) 70 MG tablet   Other Relevant Orders   DG Bone Density     Other   Vitamin D  deficiency   Last vitamin D  Lab Results  Component Value Date   VD25OH 31.47 10/06/2023   Vitamin D  level is therapeutic with current supplementation Disc importance of this to bone and overall health       Vitamin B12 deficiency   New Lab Results  Component Value Date   VITAMINB12 181 (L) 10/06/2023   Shot today  Instructed to start 1000 mcg daily over the counter Re check at 3 mo visit       Routine general medical examination at a  health care facility - Primary   Reviewed health habits including diet and exercise and skin cancer prevention Reviewed appropriate screening tests for age  Also reviewed health mt list, fam hx and immunization status , as well as social and family history   See HPI Labs reviewed and ordered Health Maintenance  Topic Date Due   Hepatitis C Screening  Never done   Hepatitis B Vaccine (1 of 3 - 19+ 3-dose series) Never done   Zoster (Shingles) Vaccine (1 of 2) Never done   Pap with HPV screening  05/05/2009   Complete foot exam   09/25/2022   Breast Cancer Screening  11/23/2023   COVID-19 Vaccine (4 - 2025-26 season) 10/30/2025*   Hemoglobin A1C  04/05/2024   Medicare Annual Wellness Visit  08/10/2024   Eye exam for diabetics  09/23/2024   Yearly kidney function blood test for diabetes  10/05/2024   Yearly kidney health urinalysis for diabetes  10/05/2024   Colon Cancer Screening  09/08/2028   DTaP/Tdap/Td vaccine (3 - Td or Tdap) 04/13/2033   Pneumococcal Vaccine for age over 51  Completed   Flu Shot  Completed   HIV Screening  Completed   HPV Vaccine  Aged Out   Meningitis B Vaccine  Aged Out  *Topic was  postponed. The date shown is not the original due date.    Flu shot today  Mammogram scheduled for December Dexa ordered  Discussed fall prevention, supplements and exercise for bone density  PHQ 4 with treatment        Depression with anxiety   Doing well with wellbutrin  xr 300 mg daily  Thinks she is doing well      Current use of proton pump inhibitor   Other Visit Diagnoses       Need for influenza vaccination       Relevant Orders   Flu vaccine trivalent PF, 6mos and older(Flulaval,Afluria,Fluarix,Fluzone) (Completed)

## 2023-10-15 NOTE — Patient Instructions (Addendum)
 I put the order in for DEXA - at [facility] can call them to schedule   Get over the counter vitamin D  3 and take 2000 international units daily   Stay active Walking  Add some strength training to your routine, this is important for bone and brain health and can reduce your risk of falls and help your body use insulin  properly and regulate weight  Light weights, exercise bands , and internet videos are a good way to start  Yoga (chair or regular), machines , floor exercises or a gym with machines are also good options  This is especially important with the GLP medicine   Get started back on alendronate  weekly for bone health  Hold for any major dental/jaw procedures   You are low on vitamin B12  Get over the counter b12 and take 1000 mcg daily  Also a B12 shot today   Follow up in 3 months for diabetes

## 2023-10-16 NOTE — Assessment & Plan Note (Signed)
 Protonix  40 mg daily  Well controlled

## 2023-10-16 NOTE — Assessment & Plan Note (Signed)
 Last vitamin D  Lab Results  Component Value Date   VD25OH 31.47 10/06/2023   Vitamin D  level is therapeutic with current supplementation Disc importance of this to bone and overall health

## 2023-10-16 NOTE — Assessment & Plan Note (Signed)
 bp in fair control at this time  BP Readings from Last 1 Encounters:  10/15/23 134/78   No changes needed Most recent labs reviewed  Disc lifstyle change with low sodium diet and exercise   Chlorthalidone  25 mg daily  Olmesartan  20 mg daily  Metoprolol  100 mg bid-tolerating well

## 2023-10-16 NOTE — Assessment & Plan Note (Signed)
 New Lab Results  Component Value Date   VITAMINB12 181 (L) 10/06/2023   Shot today  Instructed to start 1000 mcg daily over the counter Re check at 3 mo visit

## 2023-10-16 NOTE — Assessment & Plan Note (Signed)
 Lab Results  Component Value Date   HGBA1C 7.3 (H) 10/06/2023   HGBA1C 7.0 (H) 06/29/2023   HGBA1C 7.0 (H) 03/23/2023  Up  Metformin  1000 mg bid  Semaglutide  2 mg weekly (waiting on mail/pt assist)  This will unfortunately run out  Not compliant with low glycemic diet Discussed this Microalb utd  Discussed exercise options Follow up 3 mo

## 2023-10-16 NOTE — Assessment & Plan Note (Signed)
 Will start back on alendronate  (hold for dental work on future) No falls or fractures  Dexa ordered Discussed fall prevention, supplements and exercise for bone density

## 2023-10-16 NOTE — Assessment & Plan Note (Signed)
 Hypothyroidism -subclinical  Pt has no clinical changes No change in energy level/ hair or skin/ edema and no tremor Lab Results  Component Value Date   TSH 5.77 (H) 10/06/2023    Off levothyroxine   Prefers to be off medication

## 2023-10-16 NOTE — Assessment & Plan Note (Signed)
 Doing well with wellbutrin  xr 300 mg daily  Thinks she is doing well

## 2023-10-16 NOTE — Assessment & Plan Note (Addendum)
 Reviewed health habits including diet and exercise and skin cancer prevention Reviewed appropriate screening tests for age  Also reviewed health mt list, fam hx and immunization status , as well as social and family history   See HPI Labs reviewed and ordered Health Maintenance  Topic Date Due   Hepatitis C Screening  Never done   Hepatitis B Vaccine (1 of 3 - 19+ 3-dose series) Never done   Zoster (Shingles) Vaccine (1 of 2) Never done   Pap with HPV screening  05/05/2009   Complete foot exam   09/25/2022   Breast Cancer Screening  11/23/2023   COVID-19 Vaccine (4 - 2025-26 season) 10/30/2025*   Hemoglobin A1C  04/05/2024   Medicare Annual Wellness Visit  08/10/2024   Eye exam for diabetics  09/23/2024   Yearly kidney function blood test for diabetes  10/05/2024   Yearly kidney health urinalysis for diabetes  10/05/2024   Colon Cancer Screening  09/08/2028   DTaP/Tdap/Td vaccine (3 - Td or Tdap) 04/13/2033   Pneumococcal Vaccine for age over 35  Completed   Flu Shot  Completed   HIV Screening  Completed   HPV Vaccine  Aged Out   Meningitis B Vaccine  Aged Out  *Topic was postponed. The date shown is not the original due date.    Flu shot today  Mammogram scheduled for December Dexa ordered  Discussed fall prevention, supplements and exercise for bone density  PHQ 4 with treatment

## 2023-10-16 NOTE — Assessment & Plan Note (Signed)
Disc goals for lipids and reasons to control them Rev last labs with pt Rev low sat fat diet in detail Crestor 5 mg daily

## 2023-10-19 ENCOUNTER — Telehealth: Payer: Self-pay | Admitting: *Deleted

## 2023-10-19 NOTE — Telephone Encounter (Signed)
 Received 4 boxes of ozempic   8mg /54mL Lot: [id number], exp date 05/05/26  Left VM letting pt know med here for pick up  Fyi to pharmacy dpt

## 2023-11-02 DIAGNOSIS — Z20822 Contact with and (suspected) exposure to covid-19: Secondary | ICD-10-CM | POA: Diagnosis not present

## 2023-11-02 DIAGNOSIS — J020 Streptococcal pharyngitis: Secondary | ICD-10-CM | POA: Diagnosis not present

## 2023-11-02 DIAGNOSIS — Z13220 Encounter for screening for lipoid disorders: Secondary | ICD-10-CM | POA: Diagnosis not present

## 2023-11-02 DIAGNOSIS — E785 Hyperlipidemia, unspecified: Secondary | ICD-10-CM | POA: Diagnosis not present

## 2023-11-17 NOTE — Progress Notes (Signed)
 Patient ID: [name], female    DOB: 11/01/1965, 58 y.o.   MRN: [id number]  HPI F followed for obstructive sleep apnea and allergic rhinitis, asthma, complicated by insomnia, GERD, depression. NPSG 10/08/06 AHI 33/hr.  ================================================================================================================   11/19/22- - 58 year old female never smoker followed for OSA, complicated by Allergic rhinitis, Asthma (Dr Iva), insomnia, GERD, depression, DM2, Aortic Atherosclerosis, Sclerosing Mesenteritis, Uveitis, Chronic Iritis/[facility]/methotrexate/ Humira,  - Adderall 20, Albuterol  hfa, Symbicort  160, Neb albuterol , MTX,  CPAP auto 5-15/Adapt                 Machine replaced in 2019 Download- compliance 100%, AHI 1.6/hr Body weight today 179 lbs Had flu vax. Continues  MTX/Humira for uveitis. Allergist treats asthma/ rhinitis.Download reviewed. Due for replacement machine. CXR 03/25/22-  IMPRESSION: 1. No active cardiopulmonary disease. 2.  Aortic Atherosclerosis (ICD10-I70.0).   11/18/23- 58 year old female never smoker followed for OSA, complicated by AFib, Allergic rhinitis, Asthma (Dr Iva), insomnia, GERD, depression, DM2, Aortic Atherosclerosis, Sclerosing Mesenteritis, Uveitis, Chronic Iritis/[facility]/methotrexate/ Humira,  - Adderall 20(Dr Tower), Albuterol  hfa, Symbicort  160, Neb albuterol , MTX,  CPAP auto 5-15/Adapt                 Machine replaced in Nov, 2024 Download- compliance - 97%, AHI 1.6/hr Body weight today 182 lbs Continues  MTX/Humira for uveitis. Allergist treats asthma/ rhinitis      Sleep is good.  Using CPAP nightly. Discussed the use of AI scribe software for clinical note transcription with the patient, who gave verbal consent to proceed.  History of Present Illness   The patient is a 58 year old with obstructive sleep apnea and recent onset atrial fibrillation who presents for follow-up.  She uses her CPAP machine  regularly with less than two breakthrough apneas per hour. Her sleep quality varies, and she has a history of insomnia but is not on medication for it. She experienced issues with her CPAP humidifier and temporarily switched machines without improvement, but continues using the new CPAP machine. Her asthma is managed by her allergist and is not currently a breathing problem. She takes Adderall from her PCP as needed for concentration at work.     Assessment and Plan:    Obstructive sleep apnea with history of atrial fibrillation Obstructive sleep apnea well-managed with CPAP.  - Continue CPAP therapy. - Continue Eliquis  for anticoagulation. - Contact home care company for CPAP issues if needed.   Atrial fibrillation -managed by cardiology in sinus rhythm, no recent episodes. On Eliquis , potential discontinuation if stable for 5-6 months.   Insomnia-primary Ok for now on no Rx conservative management      Review of Systems- see HPI   + = positive Constitutional:   No-   weight loss, night sweats, fevers, chills, fatigue, lassitude. HEENT:   No-  headaches, difficulty swallowing, tooth/dental problems, sore throat,       No- sneezing, itching, ear ache, +nasal congestion, little post nasal drip,  CV:  No-   chest pain, orthopnea, PND, swelling in lower extremities, anasarca, dizziness, palpitations Resp: No-   shortness of breath with exertion or at rest.              No-   productive cough, + non-productive cough,  No- coughing up of blood.              No-   change in color of mucus.  No- wheezing.   Skin: No-   rash or lesions. GI:  No-   heartburn, indigestion, abdominal pain, nausea, vomiting,  GU: MS:   Neuro-     nothing unusual Psych:  No- change in mood or affect. No depression or anxiety.  No memory loss.  Objective:   Physical Exam General- Alert, Oriented, Affect-appropriate, Distress- none acute, + Overweight Skin- +keratosis pilaris Lymphadenopathy- none Head-  atraumatic            Eyes- Gross vision intact, PERRLA, conjunctivae clear secretions            Ears- Hearing, canals-normal            Nose- mucosa + clear, turbinate edema, +Septal dev,no- polyps, erosion, perforation             Throat- Mallampati II-III , mucosa clear , drainage- none, tonsils- atrophic Neck- flexible , trachea midline, no stridor , thyroid  nl, carotid no bruit Chest - symmetrical excursion , unlabored           Heart/CV- RRR , no murmur , no gallop  , no rub, nl s1 s2                           - JVD- none , edema- none, stasis changes- none, varices- none           Lung- clear to P&A, wheeze- none, cough- none  , dullness-none, rub- none           Chest wall-  Abd-  Br/ Gen/ Rectal- Not done, not indicated Extrem- +healed incision scar R lower leg Neuro- grossly intact to observation

## 2023-11-18 ENCOUNTER — Encounter: Payer: Self-pay | Admitting: Internal Medicine

## 2023-11-18 ENCOUNTER — Ambulatory Visit: Admitting: Internal Medicine

## 2023-11-18 VITALS — BP 140/92 | HR 82 | Temp 98.1°F | Ht 67.0 in | Wt 182.6 lb

## 2023-11-18 DIAGNOSIS — G4733 Obstructive sleep apnea (adult) (pediatric): Secondary | ICD-10-CM | POA: Diagnosis not present

## 2023-11-18 DIAGNOSIS — F5101 Primary insomnia: Secondary | ICD-10-CM

## 2023-11-18 DIAGNOSIS — I4891 Unspecified atrial fibrillation: Secondary | ICD-10-CM

## 2023-11-18 NOTE — Patient Instructions (Signed)
 Glad the CPAP is doing well. We can continue auto 5-15  When you check out, ask them to bring you back with a sleep doctor

## 2023-11-19 ENCOUNTER — Ambulatory Visit: Payer: Medicare HMO | Admitting: Internal Medicine

## 2023-11-26 NOTE — Progress Notes (Signed)
 CAMILLIA MARCY                                          MRN: [id number]   11/26/2023   The [facility] Quality Team Specialist reviewed this patient medical record for the purposes of chart review for care gap closure. The following were reviewed: abstraction for care gap closure-kidney health evaluation for diabetes:eGFR  and uACR.    [facility] Quality Team

## 2023-12-01 ENCOUNTER — Other Ambulatory Visit: Payer: Self-pay | Admitting: Family Medicine

## 2023-12-01 DIAGNOSIS — Z1231 Encounter for screening mammogram for malignant neoplasm of breast: Secondary | ICD-10-CM

## 2023-12-05 ENCOUNTER — Other Ambulatory Visit: Payer: Self-pay | Admitting: Medical Genetics

## 2023-12-07 ENCOUNTER — Ambulatory Visit
Admission: RE | Admit: 2023-12-07 | Discharge: 2023-12-07 | Disposition: A | Source: Ambulatory Visit | Attending: Family Medicine | Admitting: Family Medicine

## 2023-12-07 DIAGNOSIS — Z1231 Encounter for screening mammogram for malignant neoplasm of breast: Secondary | ICD-10-CM

## 2023-12-09 ENCOUNTER — Inpatient Hospital Stay (HOSPITAL_COMMUNITY): Admission: RE | Admit: 2023-12-09 | Discharge: 2023-12-09 | Payer: Self-pay | Attending: Oncology | Admitting: Oncology

## 2023-12-10 ENCOUNTER — Encounter: Payer: Self-pay | Admitting: Allergy & Immunology

## 2023-12-10 ENCOUNTER — Ambulatory Visit: Admitting: Allergy & Immunology

## 2023-12-10 VITALS — BP 142/92 | HR 87 | Temp 97.8°F | Resp 12 | Ht 66.14 in | Wt 180.0 lb

## 2023-12-10 DIAGNOSIS — J4531 Mild persistent asthma with (acute) exacerbation: Secondary | ICD-10-CM | POA: Diagnosis not present

## 2023-12-10 DIAGNOSIS — D849 Immunodeficiency, unspecified: Secondary | ICD-10-CM

## 2023-12-10 DIAGNOSIS — J310 Chronic rhinitis: Secondary | ICD-10-CM | POA: Diagnosis not present

## 2023-12-10 MED ORDER — METHYLPREDNISOLONE ACETATE 40 MG/ML IJ SUSP
40.0000 mg | Freq: Once | INTRAMUSCULAR | Status: AC
  Administered 2023-12-10: 40 mg via INTRAMUSCULAR

## 2023-12-10 MED ORDER — BUDESONIDE-FORMOTEROL FUMARATE 160-4.5 MCG/ACT IN AERO: 2.0000 | INHALATION_SPRAY | Freq: Two times a day (BID) | RESPIRATORY_TRACT | 5 refills | Status: DC

## 2023-12-10 MED ORDER — PREDNISONE 10 MG PO TABS: ORAL_TABLET | ORAL | 0 refills | Status: AC

## 2023-12-10 MED ORDER — BUDESONIDE-FORMOTEROL FUMARATE 160-4.5 MCG/ACT IN AERO: INHALATION_SPRAY | RESPIRATORY_TRACT | 2 refills | Status: AC

## 2023-12-10 MED ORDER — ALBUTEROL SULFATE (2.5 MG/3ML) 0.083% IN NEBU: 2.5000 mg | INHALATION_SOLUTION | RESPIRATORY_TRACT | 1 refills | Status: AC | PRN

## 2023-12-10 MED ORDER — ALBUTEROL SULFATE HFA 108 (90 BASE) MCG/ACT IN AERS: 2.0000 | INHALATION_SPRAY | RESPIRATORY_TRACT | 1 refills | Status: AC | PRN

## 2023-12-10 NOTE — Patient Instructions (Addendum)
 1. Moderate persistent asthma, uncomplicated - Lung testing looked low today, but it did improve drastically with the albuterol  puffs. - DepoMedrol injection given today.  - We are going to start you on a prednisone  taper to get your symptoms under better control.  - We are going to send in Symbicort  to use two puffs every 4 hours during illnesses.  - I asked to keep it on file.  - Daily controller medication(s): NONE - Prior to physical activity: Symbicort  160/4.5 two puffs 10-15 minutes before physical activity. - Rescue medications: albuterol  4 puffs every 4-6 hours as needed or albuterol  nebulizer one vial every 4-6 hours as needed  - During period of respiratory distress: start Symbicort  160/4.25mcg two puffs every 4-6 hours as needed for 1-2 weeks - Asthma control goals:  * Full participation in all desired activities (may need albuterol  before activity) * Albuterol  use two time or less a week on average (not counting use with activity) * Cough interfering with sleep two time or less a month * Oral steroids no more than once a year * No hospitalizations  2. Chronic rhinitis  - Continue with cetirizine 10mg  daily as needed - Continue with Flonase Sensmist 1-2 sprays per nostril daily as needed.  - Let's get some blood work to look for environmental allergies and we can follow that up with skin testing.  - We can follow up with skin testing in the future which is more sensitive.   3. Return in about 4 weeks (around 01/07/2024). You can have the follow up appointment with Dr. Iva or a Nurse Practicioner (our Nurse Practitioners are excellent and always have Physician oversight!).    Please inform us  of any Emergency Department visits, hospitalizations, or changes in symptoms. Call us  before going to the ED for breathing or allergy  symptoms since we might be able to fit you in for a sick visit. Feel free to contact us  anytime with any questions, problems, or concerns.  It was a  pleasure to see you again today!  Websites that have reliable patient information: 1. American Academy of Asthma, Allergy , and Immunology: www.aaaai.org 2. Food Allergy  Research and Education (FARE): foodallergy.org 3. Mothers of Asthmatics: http://www.asthmacommunitynetwork.org 4. [employer] of Allergy , Asthma, and Immunology: www.acaai.org      "Like" us  on Facebook and Instagram for our latest updates!      A healthy democracy works best when [employer] participate! Make sure you are registered to vote! If you have moved or changed any of your contact information, you will need to get this updated before voting! Scan the QR codes below to learn more!

## 2023-12-10 NOTE — Progress Notes (Signed)
 FOLLOW UP  Date of Service/Encounter:  12/10/23   Assessment:   Moderate persistent asthma with acute exacerbation   Chronic rhinitis - s/p 2 rounds of allergen immunotherapy in the distant past (retesting today)    Complicated past medical history including fibromyalgia, uveitis, and panniculitis   Immunosuppressed state (methotrexate and Humira) for treatment of recurrent bilateral iritis and uveitis   Type 2 diabetes   Plan/Recommendations:   1. Moderate persistent asthma, uncomplicated - Lung testing looked low today, but it did improve drastically with the albuterol  puffs. - DepoMedrol injection given today.  - We are going to start you on a prednisone  taper to get your symptoms under better control.  - We are going to send in Symbicort  to use two puffs every 4 hours during illnesses.  - I asked to keep it on file.  - Daily controller medication(s): NONE - Prior to physical activity: Symbicort  160/4.5 two puffs 10-15 minutes before physical activity. - Rescue medications: albuterol  4 puffs every 4-6 hours as needed or albuterol  nebulizer one vial every 4-6 hours as needed  - During period of respiratory distress: start Symbicort  160/4.46mcg two puffs every 4-6 hours as needed for 1-2 weeks - Asthma control goals:  * Full participation in all desired activities (may need albuterol  before activity) * Albuterol  use two time or less a week on average (not counting use with activity) * Cough interfering with sleep two time or less a month * Oral steroids no more than once a year * No hospitalizations  2. Chronic rhinitis  - Continue with cetirizine 10mg  daily as needed - Continue with Flonase Sensmist 1-2 sprays per nostril daily as needed.  - Let's get some blood work to look for environmental allergies and we can follow that up with skin testing.  - We can follow up with skin testing in the future which is more sensitive.   3. Return in about 4 weeks (around 01/07/2024).  You can have the follow up appointment with Dr. Iva or a Nurse Practicioner (our Nurse Practitioners are excellent and always have Physician oversight!).   Subjective:   [name] is a 58 y.o. female presenting today for follow up of  Chief Complaint  Patient presents with   Follow-up   Cough   Shortness of Breath   Medication Refill    [name] has a history of the following: Patient Active Problem List   Diagnosis Date Noted   Vitamin B12 deficiency 10/15/2023   Tongue papillae hypertrophy 05/19/2023   Dry mouth 04/26/2023   Atrial fibrillation with RVR (HCC) 03/23/2023   Conductive hearing loss in left ear 02/02/2023   Left chronic serous otitis media 02/02/2023   Hypertrophy of nasal turbinates 02/02/2023   Other specified disorders of eustachian tube, left ear 02/02/2023   Hearing loss 11/30/2022   Current use of proton pump inhibitor 09/20/2022   Piriformis muscle pain 07/29/2022   Aortic atherosclerosis 03/25/2022   Fractures, stress 12/26/2021   Immunosuppressed status 11/19/2021   Encounter for screening mammogram for breast cancer 09/24/2021   Osteoporosis 09/24/2021   History of DVT (deep vein thrombosis) 07/11/2021   Sleep apnea 05/14/2021   Hyperlipidemia associated with type 2 diabetes mellitus (HCC) 07/10/2019   Cystoid macular edema of both eyes 06/16/2019   Vitamin D  deficiency 06/16/2019   Diabetes mellitus treated with injections of non-insulin  medication (HCC) 06/06/2019   Peripheral edema 10/30/2017   Chronic rhinitis 08/10/2017   Welcome to Medicare preventive visit 04/20/2017  Chronic low back pain 12/09/2015   Degeneration of lumbar or lumbosacral intervertebral disc 12/09/2015   Hypothyroid 01/24/2014   Pseudophakia of both eyes 12/15/2013   Deviated nasal septum 01/03/2013   Hydradenitis 11/02/2012   Cystoid macular degeneration of retina 05/13/2012   Macular hole 03/23/2012   Screening-pulmonary TB 03/23/2012   Routine  general medical examination at a health care facility 10/02/2011   Iritis, recurrent 10/02/2011   Tachycardia 10/02/2011   ADD (attention deficit disorder) 08/20/2010   DYSHIDROTIC ECZEMA, HANDS 11/12/2009   Obstructive sleep apnea 11/19/2006   Depression with anxiety 09/29/2006   Essential hypertension 09/29/2006   Seasonal and perennial allergic rhinitis 09/29/2006   Asthma, mild intermittent, well-controlled 09/29/2006   GERD 09/29/2006   IBS 09/29/2006   ENDOMETRIOSIS 09/29/2006   FEMALE INFERTILITY 09/29/2006   Fibromyalgia 09/29/2006   INSOMNIA 09/29/2006    History obtained from: chart review and patient.  Discussed the use of AI scribe software for clinical note transcription with the patient and/or guardian, who gave verbal consent to proceed.  [name] is a 59 y.o. female presenting for a sick visit.  She was last seen in 2024..  We sent in Symbicort  to use as needed during flares.  For her rhinitis, we will continue with cetirizine and Flonase Sensimist.  Since last visit, she has mostly done well.   She got sick four weeks ago.   She has been experiencing respiratory symptoms for approximately four weeks, characterized by persistent coughing and occasional wheezing. The cough is severe and has been consistent since onset. Her asthma is typically triggered by respiratory viruses and exercise. She has not undergone any COVID testing during this illness.  Asthma/Respiratory Symptom History: She has a history of using Symbicort  as needed but has not used it recently. She attempted to use albuterol  in a nebulizer, but it was expired. Ventolin  provided some relief but was insufficient to alleviate her symptoms. She has not had a chest x-ray or visited the hospital for this episode. Nighttime coughing has been present for the past four weeks, but she was symptom-free for about a year prior to this episode.  In general, her asthma symptoms are worse in the fall and winter.    Allergic Rhinitis Symptom History: She has a history of allergy  shots from a previous provider, Dr. Neysa, but has not had recent allergy  testing or blood work for environmental allergens. She is open to retesting to see if she might have developed some new sensitizations.   She is currently on disability due to fibromyalgia and resides in [place]. She uses Psychologist, forensic for her medications. She has a history of uveitis, which has been recurrent and affects both eyes. She has used prednisone  in the past and tolerates it, although she does not prefer it.   Otherwise, there have been no changes to her past medical history, surgical history, family history, or social history.    Review of systems otherwise negative other than that mentioned in the HPI.    Objective:   Blood pressure (!) 142/92, pulse 87, temperature 97.8 F (36.6 C), resp. rate 12, height 5' 6.14 (1.68 m), weight 180 lb (81.6 kg), last menstrual period 08/05/2006, SpO2 96%. Body mass index is 28.93 kg/m.    Physical Exam Vitals reviewed.  Constitutional:      Appearance: She is well-developed.     Comments: Very pleasant female. Talkative.  Very lovely.  HENT:     Head: Normocephalic and atraumatic.     Right Ear:  Tympanic membrane, ear canal and external ear normal.     Left Ear: Tympanic membrane, ear canal and external ear normal.     Nose: No nasal deformity, septal deviation, mucosal edema or rhinorrhea.     Right Turbinates: Enlarged, swollen and pale.     Left Turbinates: Enlarged, swollen and pale.     Right Sinus: No maxillary sinus tenderness or frontal sinus tenderness.     Left Sinus: No maxillary sinus tenderness or frontal sinus tenderness.     Comments: No polyps noted.     Mouth/Throat:     Mouth: Mucous membranes are not pale and not dry.     Pharynx: Uvula midline.  Eyes:     General: Lids are normal. Allergic shiner present.        Right eye: No discharge.        Left eye: No  discharge.     Conjunctiva/sclera: Conjunctivae normal.     Right eye: Right conjunctiva is not injected. No chemosis.    Left eye: Left conjunctiva is not injected. No chemosis.    Pupils: Pupils are equal, round, and reactive to light.  Cardiovascular:     Rate and Rhythm: Normal rate and regular rhythm.     Heart sounds: Normal heart sounds.  Pulmonary:     Effort: Pulmonary effort is normal. No tachypnea, accessory muscle usage or respiratory distress.     Breath sounds: Normal breath sounds. No wheezing, rhonchi or rales.  Chest:     Chest wall: No tenderness.  Lymphadenopathy:     Cervical: No cervical adenopathy.  Skin:    General: Skin is warm.     Capillary Refill: Capillary refill takes less than 2 seconds.     Coloration: Skin is not pale.     Findings: No abrasion, erythema, petechiae or rash. Rash is not papular, urticarial or vesicular.  Neurological:     Mental Status: She is alert.  Psychiatric:        Behavior: Behavior is cooperative.      Diagnostic studies:    Spirometry: results abnormal (FEV1: 1.99/70%, FVC: 2.38/66%, FEV1/FVC: 84%).    Spirometry consistent with possible restrictive disease. Xopenex four puffs via MDI treatment given in clinic with significant improvement in FVC per ATS criteria.  Allergy  Studies: labs sent instead        Marty Shaggy, MD  [facility] 

## 2023-12-12 LAB — ALLERGENS W/COMP RFLX AREA 2

## 2023-12-14 ENCOUNTER — Ambulatory Visit: Payer: Self-pay | Admitting: Family Medicine

## 2023-12-14 ENCOUNTER — Ambulatory Visit: Payer: Self-pay | Admitting: Allergy & Immunology

## 2023-12-14 LAB — ALLERGENS W/COMP RFLX AREA 2
Can f 4(e229) IgE: <0.1 kU/L
Cedar, Mountain IgE: 0.26 kU/L — AB
Cottonwood IgE: 0.37 kU/L — AB
D Farinae IgE: <0.1 kU/L
D Pteronyssinus IgE: <0.1 kU/L
E001-IgE Cat Dander: 13.4 kU/L — AB
E005-IgE Dog Dander: <0.1 kU/L
E094-IgE Fel d 1: 2.57 kU/L — AB
E102-IgE Can f 2: <0.1 kU/L
E220-IgE Fel d 2: 0.18 kU/L — AB
E221-IgE Can f 3: <0.1 kU/L
E230-IGE CAN F 6: <0.1 kU/L
IgE (Immunoglobulin E), Serum: 237 [IU]/mL (ref 6–495)
Johnson Grass IgE: 0.15 kU/L — AB
Johnson Grass IgE: 0.68 kU/L — AB
Maple/Box Elder IgE: 0.34 kU/L — AB
Mouse Urine IgE: <0.1 kU/L
Penicillium Chrysogen IgE: 0.14 kU/L — AB
Penicillium Chrysogen IgE: <0.1 kU/L
Pigweed, Rough IgE: 0.84 kU/L — AB
Ragweed, Short IgE: 0.3 kU/L — AB
Sheep Sorrel IgE Qn: 0.13 kU/L — AB
Timothy Grass IgE: 0.15 kU/L — AB
Timothy Grass IgE: 0.17 kU/L — AB
White Mulberry IgE: <0.1 kU/L

## 2023-12-14 LAB — PANEL 606686
Can f 4(e229) IgE: <0.1 kU/L
E101-IgE Can f 1: 1.09 kU/L — AB
E102-IgE Can f 2: 1.65 kU/L — AB
E221-IgE Can f 3: <0.1 kU/L
E226-IgE Can f 5: <0.1 kU/L
E230-IGE CAN F 6: 1.71 kU/L — AB

## 2023-12-14 LAB — PANEL 606660
E094-IgE Fel d 1: 9.39 kU/L — AB
E220-IgE Fel d 2: <0.1 kU/L
E228-IgE Fel d 4: 13.2 kU/L — AB
E231-IgE Fel d 7: 2.87 kU/L — AB

## 2023-12-14 LAB — ALLERGEN COMPONENT COMMENTS

## 2023-12-19 LAB — GENECONNECT MOLECULAR SCREEN: Genetic Analysis Overall Interpretation: NEGATIVE

## 2023-12-27 ENCOUNTER — Other Ambulatory Visit: Payer: Self-pay | Admitting: Family Medicine

## 2023-12-27 DIAGNOSIS — I10 Essential (primary) hypertension: Secondary | ICD-10-CM

## 2024-01-03 ENCOUNTER — Ambulatory Visit (HOSPITAL_COMMUNITY)
Admission: RE | Admit: 2024-01-03 | Discharge: 2024-01-03 | Disposition: A | Source: Ambulatory Visit | Attending: Family Medicine | Admitting: Family Medicine

## 2024-01-03 ENCOUNTER — Ambulatory Visit: Payer: Self-pay | Admitting: Family Medicine

## 2024-01-03 DIAGNOSIS — M8589 Other specified disorders of bone density and structure, multiple sites: Secondary | ICD-10-CM | POA: Diagnosis not present

## 2024-01-03 DIAGNOSIS — M8000XD Age-related osteoporosis with current pathological fracture, unspecified site, subsequent encounter for fracture with routine healing: Secondary | ICD-10-CM | POA: Diagnosis present

## 2024-01-06 NOTE — Progress Notes (Signed)
 "  [address] [place] [address] Dept: [phone]  FOLLOW UP NOTE  Patient ID: CHYANN AMBROCIO, female    DOB: 1965-12-01  Age: 59 y.o. MRN: [id number] Date of Office Visit: 01/07/2024  Assessment  Chief Complaint: Asthma, Allergic Rhinitis , and Follow-up  HPI DANIELLY ACKERLEY is a 59 year old female who presents to clinic for follow-up visit.  She was last seen in this clinic on 12/10/2023 by Dr. Iva for evaluation of asthma and allergic rhinitis.   Discussed the use of AI scribe software for clinical note transcription with the patient, who gave verbal consent to proceed.  History of Present Illness SHERANDA SEABROOKS is a 59 year old female with asthma and chronic uveitis who presents for follow-up of her respiratory and allergy  symptoms.  At today's visit, she reports her respiratory symptoms have improved since the last visit. She has not experienced recent shortness of breath, wheezing, or coughing. She continues to use Symbicort  160 two puffs twice a day, and albuterol  as needed, though she hasn't needed albuterol  in the past four to five days.  Allergic rhinitis is reported as moderately well-controlled with occasional nasal symptoms for which she continues cetirizine 10 mg once a day and occasionally takes Benadryl for breakthrough symptoms.  She is not currently using Flonase or nasal saline rinses.  Her last environmental allergy  testing via lab on 12/10/2023 was positive to cat, dog, grass pollen, tree pollen, weed pollen, and ragweed pollen.  We discussed possible follow-up with skin testing to discover any environmental allergies not revealed via lab testing, however, she feels at this point that her allergies have been well-controlled.  She does understand that she can come for skin testing if this becomes more of a problem.  She does report itchy eyes which occurred on Monday.  She continues Pred Forte  for control of uveitis and occasionally uses a  lubricating eyedrop with relief of symptoms.  She continues on methotrexate and Humira.   Her current medications are listed in the chart.  Drug Allergies:  Allergies[1]  Physical Exam: BP 124/78 (BP Location: Left Arm, Patient Position: Sitting, Cuff Size: Normal)   Pulse 90   Temp 98.2 F (36.8 C) (Temporal)   LMP 08/05/2006   SpO2 96%    Physical Exam Vitals reviewed.  Constitutional:      Appearance: Normal appearance.  HENT:     Head: Normocephalic and atraumatic.     Right Ear: Tympanic membrane normal.     Left Ear: Tympanic membrane normal.     Nose:     Comments: Bilateral nares normal.  Pharynx normal.  Ears normal.  Eyes normal. Eyes:     Conjunctiva/sclera: Conjunctivae normal.  Cardiovascular:     Rate and Rhythm: Normal rate and regular rhythm.     Heart sounds: Normal heart sounds. No murmur heard. Pulmonary:     Effort: Pulmonary effort is normal.     Breath sounds: Normal breath sounds.     Comments: Lungs clear to auscultation Musculoskeletal:        General: Normal range of motion.     Cervical back: Normal range of motion and neck supple.  Skin:    General: Skin is warm and dry.  Neurological:     Mental Status: She is alert and oriented to person, place, and time.  Psychiatric:        Mood and Affect: Mood normal.        Behavior: Behavior normal.  Thought Content: Thought content normal.        Judgment: Judgment normal.     Assessment and Plan: 1. Moderate persistent asthma, uncomplicated   2. Immunosuppressed status   3. Seasonal and perennial allergic rhinitis     Patient Instructions  Asthma - Daily controller medication(s): Continue Symbicort  2 puffs twice a day for the next 4 weeks, then, if you are cough and wheeze free, stop Symbicort  - Prior to physical activity: albuterol  2 puffs 10-15 minutes before physical activity. - Rescue medications: albuterol  4 puffs every 4-6 hours as needed or albuterol  nebulizer one vial every  4-6 hours as needed  - During period of respiratory distress: start Symbicort  160/4.46mcg two puffs twice daily with spacer for 1-2 weeks - Asthma control goals:  * Full participation in all desired activities (may need albuterol  before activity) * Albuterol  use two time or less a week on average (not counting use with activity) * Cough interfering with sleep two time or less a month * Oral steroids no more than once a year * No hospitalizations  Allergic rhinitis Continue allergen avoidance measures directed toward cats, dog, grass pollen, tree pollen, weed pollen, and ragweed pollen as listed below - Continue with cetirizine 10mg  daily as needed - Continue with Flonase Sensmist 1-2 sprays per nostril daily as needed.  Consider saline nasal rinses as needed for nasal symptoms. Use this before any medicated nasal sprays for best result Consider allergen immunotherapy if your symptoms are not well-controlled with the treatment plan as listed above  Call the clinic if this treatment plan is not working well for you.  Follow up in 6 months or sooner if needed.  Return in about 6 months (around 07/06/2024), or if symptoms worsen or fail to improve.    Thank you for the opportunity to care for this patient.  Please do not hesitate to contact me with questions.  Arlean Mutter, FNP [facility]          [1]  Allergies Allergen Reactions   Amlodipine      Pedal edema    Cetirizine Hcl Other (See Comments)    Not effective   Erythromycin Nausea Only and Other (See Comments)    Caused a stomach ache   Escitalopram Oxalate Other (See Comments)    No improvement   Lisinopril Cough   Loratadine Other (See Comments)    Not effective   Nifedipine Other (See Comments)    Fatigue and tremors   Sertraline Hcl Other (See Comments)    Did not help   Vyvanse  [Lisdexamfetamine  Dimesylate]     Headache and excitablility   "

## 2024-01-06 NOTE — Patient Instructions (Signed)
 Asthma - Daily controller medication(s): NONE - Prior to physical activity: albuterol  2 puffs 10-15 minutes before physical activity. - Rescue medications: albuterol  4 puffs every 4-6 hours as needed or albuterol  nebulizer one vial every 4-6 hours as needed  - During period of respiratory distress: start Symbicort  160/4.31mcg two puffs twice daily with spacer for 1-2 weeks - Asthma control goals:  * Full participation in all desired activities (may need albuterol  before activity) * Albuterol  use two time or less a week on average (not counting use with activity) * Cough interfering with sleep two time or less a month * Oral steroids no more than once a year * No hospitalizations  Allergic rhinitis Continue allergen avoidance measures directed toward cats, dog, grass pollen, tree pollen, weed pollen, and ragweed pollen as listed below - Continue with cetirizine 10mg  daily as needed - Continue with Flonase Sensmist 1-2 sprays per nostril daily as needed.  Consider saline nasal rinses as needed for nasal symptoms. Use this before any medicated nasal sprays for best result Consider allergen immunotherapy if your symptoms are not well-controlled with the treatment plan as listed above  Call the clinic if this treatment plan is not working well for you.  Follow up in *** or sooner if needed.  Control of Dog or Cat Allergen Avoidance is the best way to manage a dog or cat allergy . If you have a dog or cat and are allergic to dog or cats, consider removing the dog or cat from the home. If you have a dog or cat but dont want to find it a new home, or if your family wants a pet even though someone in the household is allergic, here are some strategies that may help keep symptoms at bay:  Keep the pet out of your bedroom and restrict it to only a few rooms. Be advised that keeping the dog or cat in only one room will not limit the allergens to that room. Dont pet, hug or kiss the dog or cat; if you  do, wash your hands with soap and water. High-efficiency particulate air (HEPA) cleaners run continuously in a bedroom or living room can reduce allergen levels over time. Regular use of a high-efficiency vacuum cleaner or a central vacuum can reduce allergen levels. Giving your dog or cat a bath at least once a week can reduce airborne allergen.  Reducing Pollen Exposure The American Academy of Allergy , Asthma and Immun[employer] suggests the following steps to reduce your exposure to pollen during allergy  seasons. Do not hang sheets or clothing out to dry; pollen may collect on these items. Do not mow lawns or spend time around freshly cut grass; mowing stirs up pollen. Keep windows closed at night.  Keep car windows closed while driving. Minimize morning activities outdoors, a time when pollen counts are usually at their highest. Stay indoors as much as possible when pollen counts or humidity is high and on windy days when pollen tends to remain in the air longer. Use air conditioning when possible.  Many air conditioners have filters that trap the pollen spores. Use a HEPA room air filter to remove pollen form the indoor air you breathe.       Make sure you are registered to vote! If you have moved or changed any of your contact information, you will need to get this updated before voting!  In some cases, you MAY be able to register to vote online: [email or web address]

## 2024-01-07 ENCOUNTER — Encounter: Payer: Self-pay | Admitting: Family Medicine

## 2024-01-07 ENCOUNTER — Ambulatory Visit: Admitting: Family Medicine

## 2024-01-07 ENCOUNTER — Other Ambulatory Visit: Payer: Self-pay

## 2024-01-07 VITALS — BP 124/78 | HR 90 | Temp 98.2°F

## 2024-01-07 DIAGNOSIS — J3089 Other allergic rhinitis: Secondary | ICD-10-CM

## 2024-01-07 DIAGNOSIS — J302 Other seasonal allergic rhinitis: Secondary | ICD-10-CM | POA: Diagnosis not present

## 2024-01-07 DIAGNOSIS — D849 Immunodeficiency, unspecified: Secondary | ICD-10-CM | POA: Diagnosis not present

## 2024-01-07 DIAGNOSIS — J4540 Moderate persistent asthma, uncomplicated: Secondary | ICD-10-CM

## 2024-01-09 ENCOUNTER — Encounter: Payer: Self-pay | Admitting: Family Medicine

## 2024-01-17 ENCOUNTER — Ambulatory Visit: Admitting: Family Medicine

## 2024-01-17 VITALS — BP 139/85 | HR 85 | Temp 98.4°F | Ht 66.0 in | Wt 181.0 lb

## 2024-01-17 DIAGNOSIS — E1165 Type 2 diabetes mellitus with hyperglycemia: Secondary | ICD-10-CM | POA: Diagnosis not present

## 2024-01-17 DIAGNOSIS — M797 Fibromyalgia: Secondary | ICD-10-CM

## 2024-01-17 DIAGNOSIS — I10 Essential (primary) hypertension: Secondary | ICD-10-CM | POA: Diagnosis not present

## 2024-01-17 DIAGNOSIS — E785 Hyperlipidemia, unspecified: Secondary | ICD-10-CM | POA: Diagnosis not present

## 2024-01-17 DIAGNOSIS — J4540 Moderate persistent asthma, uncomplicated: Secondary | ICD-10-CM

## 2024-01-17 DIAGNOSIS — E1169 Type 2 diabetes mellitus with other specified complication: Secondary | ICD-10-CM

## 2024-01-17 DIAGNOSIS — Z7985 Long-term (current) use of injectable non-insulin antidiabetic drugs: Secondary | ICD-10-CM | POA: Diagnosis not present

## 2024-01-17 LAB — POCT GLYCOSYLATED HEMOGLOBIN (HGB A1C): Hemoglobin A1C: 7 % — AB (ref 4.0–5.6)

## 2024-01-17 MED ORDER — AMPHETAMINE-DEXTROAMPHETAMINE 20 MG PO TABS: 20.0000 mg | ORAL_TABLET | Freq: Two times a day (BID) | ORAL | 0 refills | Status: AC

## 2024-01-17 MED ORDER — GABAPENTIN 100 MG PO CAPS: 100.0000 mg | ORAL_CAPSULE | Freq: Every day | ORAL | 0 refills | Status: AC

## 2024-01-17 NOTE — Assessment & Plan Note (Signed)
 Disc goals for lipids and reasons to control them Rev last labs with pt Rev low sat fat diet in detail Crestor  5 mg daily   Last LDL 50 -at goal

## 2024-01-17 NOTE — Assessment & Plan Note (Signed)
 Giving her more issues sleeping   Will try gabapentin  100 mg at bedtime- can increase with time if well tolerated Will report back  Noted possible side effects

## 2024-01-17 NOTE — Assessment & Plan Note (Signed)
 bp in fair control at this time -up from last check however  May be lower at home with less stress  Encouraged to check blood pressure a home when relaxed and update us  with results   BP Readings from Last 1 Encounters:  01/17/24 139/85   No changes needed Most recent labs reviewed  Disc lifstyle change with low sodium diet and exercise   Chlorthalidone  25 mg daily  Olmesartan  20 mg daily  Metoprolol  100 mg bid-tolerating well

## 2024-01-17 NOTE — Patient Instructions (Addendum)
 Any exercise possible that you tolerate-do it  Water exercise is a great option   Add some strength training to your routine, this is important for bone and brain health and can reduce your risk of falls and help your body use insulin  properly and regulate weight  Light weights, exercise bands , and internet videos are a good way to start  Yoga (chair or regular), machines , floor exercises or a gym with machines are also good options     Avoid added sugars in your diet when you can  Try to get most of your carbohydrates from produce (with the exception of white potatoes) and whole grains Eat less bread/pasta/rice/snack foods/cereals/sweets and other items from the middle of the grocery store (processed carbs)  Try gabapentin  100 mg at bedtime for fibromyalgia/sleep  Let us  know if helpful/ can increase dose with time  Check some blood pressure readings at home when relaxed     Follow up in 3 months

## 2024-01-17 NOTE — Progress Notes (Signed)
 "  Subjective:    Patient ID: [name], female    DOB: 1965/06/17, 59 y.o.   MRN: [id number]  HPI  Wt Readings from Last 3 Encounters:  01/17/24 181 lb (82.1 kg)  12/10/23 180 lb (81.6 kg)  11/18/23 182 lb 9.6 oz (82.8 kg)   29.21 kg/m  Vitals:   01/17/24 1354 01/17/24 1423  BP: (!) 152/90 139/85  Pulse: 85   Temp: 98.4 F (36.9 C)   SpO2: 98%     Pt presents for follow up of chronic health problems  DM2 HTN Lipids   Did have a recent asthma flare last month  Doing better now   HTN bp is stable today  No cp or palpitations or headaches or edema  No side effects to medicines  BP Readings from Last 3 Encounters:  01/17/24 139/85  01/07/24 124/78  12/10/23 (!) 142/92    Chlorthalidone  25 mg daily  Olmesartan  20 mg daily  Metoprolol  100 mg bid-tolerating well  No issues outside office    Lab Results  Component Value Date   NA 139 10/06/2023   K 3.9 10/06/2023   CO2 30 10/06/2023   GLUCOSE 128 (H) 10/06/2023   BUN 13 10/06/2023   CREATININE 0.92 10/06/2023   CALCIUM  9.5 10/06/2023   GFR 69.71 10/06/2023   GFRNONAA >60 03/24/2023   DM2 7.0 today  Lab Results  Component Value Date   HGBA1C 7.0 (A) 01/17/2024   HGBA1C 7.3 (H) 10/06/2023   HGBA1C 7.0 (H) 06/29/2023   Lab Results  Component Value Date   MICROALBUR <0.7 10/06/2023    Metformin  1000 mg bid Semaglutide  2 mg weekly -is able to get it but at end of mo will have a large deductible so unsure if she will be able to afford  Will let us  know at the time   Eating  Not optimal  Eats less with glp-1   Motivation issue and cravings are barrier to eating better    Exercise-gets some walking in    Hyperlipidemia Lab Results  Component Value Date   CHOL 132 10/06/2023   HDL 55.80 10/06/2023   LDLCALC 50 10/06/2023   TRIG 132.0 10/06/2023   CHOLHDL 2 10/06/2023   Crestor  5 mg daily   Subcinical hypothy Lab Results  Component Value Date   TSH 5.77 (H) 10/06/2023   No  clinical changes    Having a hard time going to sleep/staying asleep  Fibromyalgia  Tired all the time from that      Patient Active Problem List   Diagnosis Date Noted   Vitamin B12 deficiency 10/15/2023   Tongue papillae hypertrophy 05/19/2023   Dry mouth 04/26/2023   Atrial fibrillation with RVR (HCC) 03/23/2023   Conductive hearing loss in left ear 02/02/2023   Left chronic serous otitis media 02/02/2023   Hypertrophy of nasal turbinates 02/02/2023   Other specified disorders of eustachian tube, left ear 02/02/2023   Hearing loss 11/30/2022   Current use of proton pump inhibitor 09/20/2022   Piriformis muscle pain 07/29/2022   Aortic atherosclerosis 03/25/2022   Fractures, stress 12/26/2021   Immunosuppressed status 11/19/2021   Encounter for screening mammogram for breast cancer 09/24/2021   Osteoporosis 09/24/2021   History of DVT (deep vein thrombosis) 07/11/2021   Sleep apnea 05/14/2021   Hyperlipidemia associated with type 2 diabetes mellitus (HCC) 07/10/2019   Cystoid macular edema of both eyes 06/16/2019   Vitamin D  deficiency 06/16/2019   Diabetes mellitus treated with injections of  non-insulin  medication (HCC) 06/06/2019   Peripheral edema 10/30/2017   Moderate persistent asthma, uncomplicated 08/10/2017   Chronic rhinitis 08/10/2017   Welcome to Medicare preventive visit 04/20/2017   Chronic low back pain 12/09/2015   Degeneration of lumbar or lumbosacral intervertebral disc 12/09/2015   Hypothyroid 01/24/2014   Pseudophakia of both eyes 12/15/2013   Deviated nasal septum 01/03/2013   Hydradenitis 11/02/2012   Cystoid macular degeneration of retina 05/13/2012   Macular hole 03/23/2012   Screening-pulmonary TB 03/23/2012   Routine general medical examination at a health care facility 10/02/2011   Iritis, recurrent 10/02/2011   Tachycardia 10/02/2011   ADD (attention deficit disorder) 08/20/2010   DYSHIDROTIC ECZEMA, HANDS 11/12/2009   Obstructive sleep  apnea 11/19/2006   Depression with anxiety 09/29/2006   Essential hypertension 09/29/2006   Seasonal and perennial allergic rhinitis 09/29/2006   Asthma, mild intermittent, well-controlled 09/29/2006   GERD 09/29/2006   IBS 09/29/2006   ENDOMETRIOSIS 09/29/2006   FEMALE INFERTILITY 09/29/2006   Fibromyalgia 09/29/2006   INSOMNIA 09/29/2006   Past Medical History:  Diagnosis Date   ADHD (attention deficit hyperactivity disorder)    ADD - no meds   Allergy     allergic rhinitis   Anxiety    Arthritis    Asthma    Cataract 2015?   Already had them removed   Chronic headaches    Degenerative disc disease    Depression    Diabetes mellitus without complication (HCC)    type 2   Eczema    no current problems as of 07/09/21   Fatty liver    Fibromyalgia    Fx of fibula 2009   GERD (gastroesophageal reflux disease)    Hemorrhoids    Hypertension    Hyperthyroidism    Hypothyroidism    IBS (irritable bowel syndrome)    Iritis, chronic    Macular hole of left eye 06/2017   PONV (postoperative nausea and vomiting)    Right leg DVT (HCC)    Sleep apnea    uses CPAP nightly   Vitamin D  insufficiency 06/16/2019   Past Surgical History:  Procedure Laterality Date   ABDOMINAL HYSTERECTOMY  2008   ANTERIOR CRUCIATE LIGAMENT REPAIR Left 1996   small tear of ACL   BREAST EXCISIONAL BIOPSY Left 1995   BREAST SURGERY     left nipple removed - benign tumor   BUNIONECTOMY Bilateral    COLONOSCOPY     EYE SURGERY     cataracts removed, macular hole repaired left eye- both surgeries done at [facility]   FRACTURE SURGERY  July 2023   [place] plateau   LAPAROSCOPY  08/2004   endometriosis - several    ORIF TIBIA FRACTURE Right 07/10/2021   Procedure: OPEN REDUCTION INTERNAL FIXATION (ORIF) TIBIAL PLATEAU FRACTURE;  Surgeon: Celena Sharper, MD;  Location: [facility] OR;  Service: Orthopedics;  Laterality: Right;   WISDOM TOOTH EXTRACTION     Social History[1] Family History  Problem Relation  Age of Onset   Hypertension Mother    Thyroid  disease Mother    Fibromyalgia Mother    Colon polyps Mother    Arthritis Mother    Diabetes Mother    Diabetes Father    Stroke Father    Kidney cancer Maternal Grandmother    Colon cancer Neg Hx    Esophageal cancer Neg Hx    Stomach cancer Neg Hx    Rectal cancer Neg Hx    Allergic rhinitis Neg Hx    Angioedema Neg  Hx    Asthma Neg Hx    Atopy Neg Hx    Eczema Neg Hx    Immunodeficiency Neg Hx    Urticaria Neg Hx    Breast cancer Neg Hx    Allergies[2] Medications Ordered Prior to Encounter[3]  Review of Systems  Constitutional:  Positive for fatigue. Negative for activity change, appetite change, fever and unexpected weight change.  HENT:  Negative for congestion, ear pain, rhinorrhea, sinus pressure and sore throat.   Eyes:  Negative for pain, redness and visual disturbance.  Respiratory:  Negative for cough, shortness of breath and wheezing.   Cardiovascular:  Negative for chest pain and palpitations.  Gastrointestinal:  Negative for abdominal pain, blood in stool, constipation and diarrhea.  Endocrine: Negative for polydipsia and polyuria.       Sugar cravings are hard   Genitourinary:  Negative for dysuria, frequency and urgency.  Musculoskeletal:  Positive for arthralgias, back pain and myalgias.  Skin:  Negative for pallor and rash.  Allergic/Immunologic: Negative for environmental allergies.  Neurological:  Negative for dizziness, syncope and headaches.  Hematological:  Negative for adenopathy. Does not bruise/bleed easily.  Psychiatric/Behavioral:  Positive for sleep disturbance. Negative for decreased concentration and dysphoric mood. The patient is not nervous/anxious.        Objective:   Physical Exam Constitutional:      General: She is not in acute distress.    Appearance: Normal appearance. She is well-developed. She is not ill-appearing or diaphoretic.     Comments: Overweight   HENT:     Head:  Normocephalic and atraumatic.  Eyes:     Conjunctiva/sclera: Conjunctivae normal.     Pupils: Pupils are equal, round, and reactive to light.  Neck:     Thyroid : No thyromegaly.     Vascular: No carotid bruit or JVD.  Cardiovascular:     Rate and Rhythm: Normal rate and regular rhythm.     Heart sounds: Normal heart sounds.     No gallop.  Pulmonary:     Effort: Pulmonary effort is normal. No respiratory distress.     Breath sounds: Normal breath sounds. No wheezing or rales.     Comments: No wheezing Good air exch Abdominal:     General: There is no distension or abdominal bruit.     Palpations: Abdomen is soft.  Musculoskeletal:     Cervical back: Normal range of motion and neck supple.     Right lower leg: No edema.     Left lower leg: No edema.  Lymphadenopathy:     Cervical: No cervical adenopathy.  Skin:    General: Skin is warm and dry.     Coloration: Skin is not pale.     Findings: No rash.  Neurological:     Mental Status: She is alert.     Coordination: Coordination normal.     Deep Tendon Reflexes: Reflexes are normal and symmetric. Reflexes normal.  Psychiatric:        Mood and Affect: Mood normal.           Assessment & Plan:   Problem List Items Addressed This Visit       Cardiovascular and Mediastinum   Essential hypertension   bp in fair control at this time -up from last check however  May be lower at home with less stress  Encouraged to check blood pressure a home when relaxed and update us  with results   BP Readings from Last 1 Encounters:  01/17/24  139/85   No changes needed Most recent labs reviewed  Disc lifstyle change with low sodium diet and exercise   Chlorthalidone  25 mg daily  Olmesartan  20 mg daily  Metoprolol  100 mg bid-tolerating well          Respiratory   Moderate persistent asthma, uncomplicated   Did have a flare last month Saw specialist  Better now         Endocrine   Hyperlipidemia associated with type  2 diabetes mellitus (HCC)   Disc goals for lipids and reasons to control them Rev last labs with pt Rev low sat fat diet in detail Crestor  5 mg daily   Last LDL 50 -at goal      Diabetes mellitus treated with injections of non-insulin  medication (HCC) - Primary   Lab Results  Component Value Date   HGBA1C 7.0 (A) 01/17/2024   HGBA1C 7.3 (H) 10/06/2023   HGBA1C 7.0 (H) 06/29/2023   Lab Results  Component Value Date   MICROALBUR <0.7 10/06/2023     Normal foot exam  Discussed plan to work on lower glycemic diet  Also strongly stressed need for strength training-esp in setting of glp-1 Ozempic  2 mg weekly  (if this becomes un affordable will let us  know) Metformin  1000 mg bid   Follow up 3 mo    .l      Relevant Orders   POCT HgB A1C (Completed)     Other   Fibromyalgia   Giving her more issues sleeping   Will try gabapentin  100 mg at bedtime- can increase with time if well tolerated Will report back  Noted possible side effects       Relevant Medications   gabapentin  (NEURONTIN ) 100 MG capsule   Other Visit Diagnoses       Type 2 diabetes mellitus with hyperglycemia, without long-term current use of insulin  (HCC)       Relevant Orders   POCT HgB A1C (Completed)         [1]  Social History Tobacco Use   Smoking status: Never   Smokeless tobacco: Never  Vaping Use   Vaping status: Never Used  Substance Use Topics   Alcohol use: Never   Drug use: Never  [2]  Allergies Allergen Reactions   Amlodipine      Pedal edema    Cetirizine Hcl Other (See Comments)    Not effective   Erythromycin Nausea Only and Other (See Comments)    Caused a stomach ache   Escitalopram Oxalate Other (See Comments)    No improvement   Lisinopril Cough   Loratadine Other (See Comments)    Not effective   Nifedipine Other (See Comments)    Fatigue and tremors   Sertraline Hcl Other (See Comments)    Did not help   Vyvanse  [Lisdexamfetamine  Dimesylate]      Headache and excitablility  [3]  Current Outpatient Medications on File Prior to Visit  Medication Sig Dispense Refill   albuterol  (PROVENTIL ) (2.5 MG/3ML) 0.083% nebulizer solution Take 3 mLs (2.5 mg total) by nebulization every 4 (four) hours as needed for wheezing or shortness of breath. 75 mL 1   albuterol  (VENTOLIN  HFA) 108 (90 Base) MCG/ACT inhaler Inhale 2 puffs into the lungs every 4 (four) hours as needed for wheezing or shortness of breath. 18 g 1   alendronate  (FOSAMAX ) 70 MG tablet Take 1 tablet (70 mg total) by mouth every 7 (seven) days. Take with a full glass of water on an empty  stomach. 12 tablet 3   budesonide -formoterol  (SYMBICORT ) 160-4.5 MCG/ACT inhaler Use two puffs every 4-6 hours during periods of respiratory flares. 1 each 2   buPROPion  (WELLBUTRIN  XL) 300 MG 24 hr tablet Take 1 tablet by mouth once daily 90 tablet 3   CALCIUM -VITAMIN D  PO Take 1 capsule by mouth in the morning and at bedtime.     cetirizine (ZYRTEC) 10 MG tablet Take 10 mg by mouth daily as needed for allergies or rhinitis. (Patient taking differently: Take 10 mg by mouth daily.)     chlorthalidone  (HYGROTON ) 25 MG tablet Take 1 tablet by mouth once daily 90 tablet 0   ELIQUIS  5 MG TABS tablet Take 5 mg by mouth 2 (two) times daily.     fexofenadine (ALLEGRA) 180 MG tablet Take 180 mg by mouth daily.     folic acid  (FOLVITE ) 1 MG tablet Take 3 mg by mouth See admin instructions. Take 3 mg by mouth once a day on Wed/Thurs/Fri/Sat     HUMIRA PEN 40 MG/0.8ML PNKT Inject 40 mg into the skin every Monday.     ketorolac  (ACULAR ) 0.5 % ophthalmic solution Place 1 drop into the left eye 2 (two) times daily.     leucovorin (WELLCOVORIN) 5 MG tablet Take 5 mg by mouth See admin instructions. Take 5 mg by mouth once a day on only Sundays and Tuesdays     MAGNESIUM  GLYCINATE PO Take 1 capsule by mouth at bedtime.     metFORMIN  (GLUCOPHAGE ) 1000 MG tablet TAKE 1 TABLET BY MOUTH TWICE DAILY WITH A MEAL 180 tablet 0    methocarbamol  (ROBAXIN ) 500 MG tablet Take 1 tablet (500 mg total) by mouth every 8 (eight) hours as needed for muscle spasms (back pain). Caution of sedation 30 tablet 3   Methotrexate 25 MG/ML SOSY Inject 17.5 mg into the skin every Monday.     metoprolol  tartrate (LOPRESSOR ) 100 MG tablet Take 1 tablet by mouth twice daily 180 tablet 0   olmesartan  (BENICAR ) 20 MG tablet Take 1 tablet by mouth once daily 90 tablet 0   pantoprazole  (PROTONIX ) 40 MG tablet Take 1 tablet (40 mg total) by mouth daily. 90 tablet 2   prednisoLONE  acetate (PRED FORTE ) 1 % ophthalmic suspension Place 4 drops into the left eye 2 (two) times daily.     predniSONE  (DELTASONE ) 10 MG tablet Take two tablets (20mg ) twice daily for three days, then one tablet (10mg ) twice daily for three days, then STOP. 18 tablet 0   rosuvastatin  (CRESTOR ) 5 MG tablet Take 1 tablet by mouth once daily 90 tablet 0   Semaglutide , 2 MG/DOSE, 8 MG/3ML SOPN Inject 2 mg as directed once a week. 3 mL 0   traMADol  (ULTRAM ) 50 MG tablet Take 1 tablet (50 mg total) by mouth every 6 (six) hours as needed for severe pain (pain score 7-10) (for fibromyalgia flares and back pain). 60 tablet 0   No current facility-administered medications on file prior to visit.   "

## 2024-01-17 NOTE — Assessment & Plan Note (Signed)
 Lab Results  Component Value Date   HGBA1C 7.0 (A) 01/17/2024   HGBA1C 7.3 (H) 10/06/2023   HGBA1C 7.0 (H) 06/29/2023   Lab Results  Component Value Date   MICROALBUR <0.7 10/06/2023     Normal foot exam  Discussed plan to work on lower glycemic diet  Also strongly stressed need for strength training-esp in setting of glp-1 Ozempic  2 mg weekly  (if this becomes un affordable will let us  know) Metformin  1000 mg bid   Follow up 3 mo    .l

## 2024-01-17 NOTE — Assessment & Plan Note (Signed)
 Did have a flare last month Saw specialist  Better now

## 2024-01-24 ENCOUNTER — Other Ambulatory Visit: Payer: Self-pay | Admitting: Family Medicine

## 2024-02-29 ENCOUNTER — Ambulatory Visit (INDEPENDENT_AMBULATORY_CARE_PROVIDER_SITE_OTHER): Admitting: Otolaryngology

## 2024-03-22 ENCOUNTER — Ambulatory Visit: Admitting: Student

## 2024-04-17 ENCOUNTER — Ambulatory Visit: Admitting: Family Medicine

## 2024-07-14 ENCOUNTER — Ambulatory Visit: Admitting: Family Medicine

## 2024-08-11 ENCOUNTER — Ambulatory Visit
